# Patient Record
Sex: Female | Born: 1955
Health system: Southern US, Community
[De-identification: ages and names within clinical notes are randomized; demographics above are authoritative.]

## PROBLEM LIST (undated history)

## (undated) DIAGNOSIS — I639 Cerebral infarction, unspecified: Secondary | ICD-10-CM

## (undated) DIAGNOSIS — E7800 Pure hypercholesterolemia, unspecified: Secondary | ICD-10-CM

## (undated) DIAGNOSIS — E1165 Type 2 diabetes mellitus with hyperglycemia: Secondary | ICD-10-CM

## (undated) DIAGNOSIS — H269 Unspecified cataract: Secondary | ICD-10-CM

## (undated) DIAGNOSIS — G459 Transient cerebral ischemic attack, unspecified: Secondary | ICD-10-CM

## (undated) DIAGNOSIS — I10 Essential (primary) hypertension: Secondary | ICD-10-CM

## (undated) DIAGNOSIS — F32A Depression, unspecified: Secondary | ICD-10-CM

## (undated) DIAGNOSIS — I4891 Unspecified atrial fibrillation: Secondary | ICD-10-CM

## (undated) DIAGNOSIS — M1990 Unspecified osteoarthritis, unspecified site: Secondary | ICD-10-CM

## (undated) HISTORY — DX: Unspecified osteoarthritis, unspecified site: M19.90

## (undated) HISTORY — PX: TONSILECTOMY, ADENOIDECTOMY, BILATERAL MYRINGOTOMY AND TUBES: SHX2538

## (undated) HISTORY — DX: Cerebral infarction, unspecified: I63.9

## (undated) HISTORY — PX: COLON SURGERY: SHX602

## (undated) HISTORY — PX: TONSILLECTOMY: SUR1361

## (undated) HISTORY — DX: Depression, unspecified: F32.A

## (undated) HISTORY — DX: Unspecified cataract: H26.9

## (undated) HISTORY — DX: Essential (primary) hypertension: I10

## (undated) HISTORY — DX: Type 2 diabetes mellitus with hyperglycemia: E11.65

---

## 1974-08-04 HISTORY — PX: DILATION AND CURETTAGE OF UTERUS: SHX78

## 1986-08-04 LAB — CONVERTED CEMR LAB: Pap Smear: NORMAL

## 1998-02-22 ENCOUNTER — Emergency Department (HOSPITAL_COMMUNITY): Admission: EM | Admit: 1998-02-22 | Discharge: 1998-02-22 | Payer: Self-pay | Admitting: Emergency Medicine

## 1999-05-29 ENCOUNTER — Inpatient Hospital Stay (HOSPITAL_COMMUNITY): Admission: EM | Admit: 1999-05-29 | Discharge: 1999-05-31 | Payer: Self-pay | Admitting: Emergency Medicine

## 1999-05-29 ENCOUNTER — Encounter: Payer: Self-pay | Admitting: Emergency Medicine

## 2000-04-28 ENCOUNTER — Emergency Department (HOSPITAL_COMMUNITY): Admission: EM | Admit: 2000-04-28 | Discharge: 2000-04-28 | Payer: Self-pay | Admitting: Emergency Medicine

## 2000-05-07 ENCOUNTER — Encounter: Admission: RE | Admit: 2000-05-07 | Discharge: 2000-05-07 | Payer: Self-pay | Admitting: Internal Medicine

## 2000-05-14 ENCOUNTER — Encounter: Admission: RE | Admit: 2000-05-14 | Discharge: 2000-05-14 | Payer: Self-pay | Admitting: Internal Medicine

## 2000-12-13 ENCOUNTER — Encounter: Payer: Self-pay | Admitting: Emergency Medicine

## 2000-12-13 ENCOUNTER — Emergency Department (HOSPITAL_COMMUNITY): Admission: EM | Admit: 2000-12-13 | Discharge: 2000-12-13 | Payer: Self-pay | Admitting: Emergency Medicine

## 2001-03-20 ENCOUNTER — Emergency Department (HOSPITAL_COMMUNITY): Admission: EM | Admit: 2001-03-20 | Discharge: 2001-03-21 | Payer: Self-pay | Admitting: Emergency Medicine

## 2001-10-29 ENCOUNTER — Emergency Department (HOSPITAL_COMMUNITY): Admission: EM | Admit: 2001-10-29 | Discharge: 2001-10-29 | Payer: Self-pay | Admitting: Emergency Medicine

## 2001-11-02 ENCOUNTER — Encounter: Admission: RE | Admit: 2001-11-02 | Discharge: 2001-11-02 | Payer: Self-pay | Admitting: Internal Medicine

## 2002-03-14 ENCOUNTER — Emergency Department (HOSPITAL_COMMUNITY): Admission: EM | Admit: 2002-03-14 | Discharge: 2002-03-14 | Payer: Self-pay | Admitting: *Deleted

## 2003-07-26 ENCOUNTER — Emergency Department (HOSPITAL_COMMUNITY): Admission: EM | Admit: 2003-07-26 | Discharge: 2003-07-26 | Payer: Self-pay | Admitting: Emergency Medicine

## 2003-10-25 ENCOUNTER — Emergency Department (HOSPITAL_COMMUNITY): Admission: EM | Admit: 2003-10-25 | Discharge: 2003-10-25 | Payer: Self-pay | Admitting: Emergency Medicine

## 2004-04-03 ENCOUNTER — Emergency Department (HOSPITAL_COMMUNITY): Admission: EM | Admit: 2004-04-03 | Discharge: 2004-04-03 | Payer: Self-pay | Admitting: Emergency Medicine

## 2004-07-31 ENCOUNTER — Emergency Department (HOSPITAL_COMMUNITY): Admission: EM | Admit: 2004-07-31 | Discharge: 2004-07-31 | Payer: Self-pay | Admitting: Emergency Medicine

## 2004-08-17 ENCOUNTER — Emergency Department (HOSPITAL_COMMUNITY): Admission: EM | Admit: 2004-08-17 | Discharge: 2004-08-17 | Payer: Self-pay | Admitting: Emergency Medicine

## 2004-10-30 ENCOUNTER — Emergency Department (HOSPITAL_COMMUNITY): Admission: EM | Admit: 2004-10-30 | Discharge: 2004-10-30 | Payer: Self-pay | Admitting: Family Medicine

## 2004-11-09 ENCOUNTER — Emergency Department (HOSPITAL_COMMUNITY): Admission: EM | Admit: 2004-11-09 | Discharge: 2004-11-09 | Payer: Self-pay | Admitting: Emergency Medicine

## 2005-06-17 ENCOUNTER — Ambulatory Visit (HOSPITAL_COMMUNITY): Admission: RE | Admit: 2005-06-17 | Discharge: 2005-06-17 | Payer: Self-pay | Admitting: Gastroenterology

## 2005-08-01 ENCOUNTER — Emergency Department (HOSPITAL_COMMUNITY): Admission: EM | Admit: 2005-08-01 | Discharge: 2005-08-01 | Payer: Self-pay | Admitting: Emergency Medicine

## 2006-03-03 ENCOUNTER — Emergency Department (HOSPITAL_COMMUNITY): Admission: EM | Admit: 2006-03-03 | Discharge: 2006-03-03 | Payer: Self-pay | Admitting: Emergency Medicine

## 2006-05-28 ENCOUNTER — Encounter: Payer: Self-pay | Admitting: Vascular Surgery

## 2006-05-28 ENCOUNTER — Encounter (INDEPENDENT_AMBULATORY_CARE_PROVIDER_SITE_OTHER): Payer: Self-pay | Admitting: Cardiology

## 2006-05-28 ENCOUNTER — Inpatient Hospital Stay (HOSPITAL_COMMUNITY): Admission: EM | Admit: 2006-05-28 | Discharge: 2006-05-30 | Payer: Self-pay | Admitting: Emergency Medicine

## 2006-06-01 ENCOUNTER — Emergency Department (HOSPITAL_COMMUNITY): Admission: EM | Admit: 2006-06-01 | Discharge: 2006-06-01 | Payer: Self-pay | Admitting: Emergency Medicine

## 2006-06-02 ENCOUNTER — Ambulatory Visit: Payer: Self-pay | Admitting: Internal Medicine

## 2006-06-04 ENCOUNTER — Ambulatory Visit: Payer: Self-pay | Admitting: *Deleted

## 2006-08-05 ENCOUNTER — Emergency Department (HOSPITAL_COMMUNITY): Admission: EM | Admit: 2006-08-05 | Discharge: 2006-08-05 | Payer: Self-pay | Admitting: Emergency Medicine

## 2007-04-16 DIAGNOSIS — I10 Essential (primary) hypertension: Secondary | ICD-10-CM | POA: Insufficient documentation

## 2007-04-21 ENCOUNTER — Encounter (INDEPENDENT_AMBULATORY_CARE_PROVIDER_SITE_OTHER): Payer: Self-pay | Admitting: *Deleted

## 2007-06-18 ENCOUNTER — Emergency Department (HOSPITAL_COMMUNITY): Admission: EM | Admit: 2007-06-18 | Discharge: 2007-06-18 | Payer: Self-pay | Admitting: Emergency Medicine

## 2007-07-19 ENCOUNTER — Emergency Department (HOSPITAL_COMMUNITY): Admission: EM | Admit: 2007-07-19 | Discharge: 2007-07-19 | Payer: Self-pay | Admitting: Emergency Medicine

## 2007-07-20 ENCOUNTER — Ambulatory Visit (HOSPITAL_COMMUNITY): Admission: RE | Admit: 2007-07-20 | Discharge: 2007-07-20 | Payer: Self-pay | Admitting: Emergency Medicine

## 2007-07-20 ENCOUNTER — Encounter (INDEPENDENT_AMBULATORY_CARE_PROVIDER_SITE_OTHER): Payer: Self-pay | Admitting: Emergency Medicine

## 2007-07-20 ENCOUNTER — Ambulatory Visit: Payer: Self-pay | Admitting: Surgery

## 2007-09-05 ENCOUNTER — Encounter: Admission: RE | Admit: 2007-09-05 | Discharge: 2007-09-05 | Payer: Self-pay | Admitting: Family Medicine

## 2007-09-27 ENCOUNTER — Emergency Department (HOSPITAL_COMMUNITY): Admission: EM | Admit: 2007-09-27 | Discharge: 2007-09-27 | Payer: Self-pay | Admitting: Emergency Medicine

## 2007-10-03 HISTORY — PX: MENISCUS REPAIR: SHX5179

## 2007-10-05 ENCOUNTER — Ambulatory Visit (HOSPITAL_BASED_OUTPATIENT_CLINIC_OR_DEPARTMENT_OTHER): Admission: RE | Admit: 2007-10-05 | Discharge: 2007-10-05 | Payer: Self-pay | Admitting: Orthopedic Surgery

## 2007-12-01 ENCOUNTER — Emergency Department (HOSPITAL_COMMUNITY): Admission: EM | Admit: 2007-12-01 | Discharge: 2007-12-02 | Payer: Self-pay | Admitting: Emergency Medicine

## 2008-07-18 ENCOUNTER — Emergency Department (HOSPITAL_COMMUNITY): Admission: EM | Admit: 2008-07-18 | Discharge: 2008-07-18 | Payer: Self-pay | Admitting: Emergency Medicine

## 2008-07-25 ENCOUNTER — Encounter (INDEPENDENT_AMBULATORY_CARE_PROVIDER_SITE_OTHER): Payer: Self-pay | Admitting: Family Medicine

## 2008-07-31 ENCOUNTER — Ambulatory Visit: Payer: Self-pay | Admitting: Family Medicine

## 2008-07-31 ENCOUNTER — Encounter (INDEPENDENT_AMBULATORY_CARE_PROVIDER_SITE_OTHER): Payer: Self-pay | Admitting: Family Medicine

## 2008-07-31 DIAGNOSIS — N39498 Other specified urinary incontinence: Secondary | ICD-10-CM | POA: Insufficient documentation

## 2008-07-31 LAB — CONVERTED CEMR LAB
ALT: 14 units/L (ref 0–35)
AST: 14 units/L (ref 0–37)
CO2: 26 meq/L (ref 19–32)
Calcium: 9.3 mg/dL (ref 8.4–10.5)
Chloride: 104 meq/L (ref 96–112)
Creatinine, Ser: 1.36 mg/dL — ABNORMAL HIGH (ref 0.40–1.20)
Glucose, Bld: 98 mg/dL (ref 70–99)
HCT: 39.6 % (ref 36.0–46.0)
Hemoglobin: 12.4 g/dL (ref 12.0–15.0)
Platelets: 305 10*3/uL (ref 150–400)
Potassium: 4.4 meq/L (ref 3.5–5.3)
RDW: 15.4 % (ref 11.5–15.5)
Sodium: 140 meq/L (ref 135–145)
TSH: 2.879 microintl units/mL (ref 0.350–4.50)
Total Bilirubin: 0.4 mg/dL (ref 0.3–1.2)
Total CHOL/HDL Ratio: 3.6
Total Protein: 7 g/dL (ref 6.0–8.3)
Triglycerides: 140 mg/dL (ref <150)
VLDL: 28 mg/dL (ref 0–40)
WBC: 5.9 10*3/uL (ref 4.0–10.5)

## 2008-08-01 ENCOUNTER — Encounter (INDEPENDENT_AMBULATORY_CARE_PROVIDER_SITE_OTHER): Payer: Self-pay | Admitting: *Deleted

## 2008-08-01 ENCOUNTER — Encounter (INDEPENDENT_AMBULATORY_CARE_PROVIDER_SITE_OTHER): Payer: Self-pay | Admitting: Family Medicine

## 2008-08-01 DIAGNOSIS — N182 Chronic kidney disease, stage 2 (mild): Secondary | ICD-10-CM | POA: Insufficient documentation

## 2008-08-15 ENCOUNTER — Encounter: Payer: Self-pay | Admitting: Family Medicine

## 2008-08-15 LAB — CONVERTED CEMR LAB: Microalbumin U total vol: NORMAL mg/L

## 2008-08-16 ENCOUNTER — Encounter (INDEPENDENT_AMBULATORY_CARE_PROVIDER_SITE_OTHER): Payer: Self-pay | Admitting: Family Medicine

## 2008-08-16 ENCOUNTER — Encounter: Payer: Self-pay | Admitting: Family Medicine

## 2008-08-16 ENCOUNTER — Ambulatory Visit: Payer: Self-pay | Admitting: Family Medicine

## 2008-08-16 ENCOUNTER — Inpatient Hospital Stay (HOSPITAL_COMMUNITY): Admission: EM | Admit: 2008-08-16 | Discharge: 2008-08-18 | Payer: Self-pay | Admitting: Emergency Medicine

## 2008-08-16 ENCOUNTER — Ambulatory Visit: Payer: Self-pay | Admitting: Cardiology

## 2008-08-18 ENCOUNTER — Encounter: Payer: Self-pay | Admitting: Sports Medicine

## 2008-08-22 ENCOUNTER — Ambulatory Visit: Payer: Self-pay | Admitting: Family Medicine

## 2008-08-22 DIAGNOSIS — E785 Hyperlipidemia, unspecified: Secondary | ICD-10-CM

## 2008-08-22 DIAGNOSIS — E1169 Type 2 diabetes mellitus with other specified complication: Secondary | ICD-10-CM | POA: Insufficient documentation

## 2008-08-23 ENCOUNTER — Encounter: Payer: Self-pay | Admitting: *Deleted

## 2008-08-29 ENCOUNTER — Encounter: Payer: Self-pay | Admitting: *Deleted

## 2008-08-29 ENCOUNTER — Ambulatory Visit: Payer: Self-pay | Admitting: Family Medicine

## 2008-08-30 ENCOUNTER — Encounter: Payer: Self-pay | Admitting: *Deleted

## 2008-08-31 ENCOUNTER — Encounter: Admission: RE | Admit: 2008-08-31 | Discharge: 2008-11-09 | Payer: Self-pay | Admitting: Family Medicine

## 2008-09-15 ENCOUNTER — Encounter (INDEPENDENT_AMBULATORY_CARE_PROVIDER_SITE_OTHER): Payer: Self-pay | Admitting: Family Medicine

## 2008-09-18 ENCOUNTER — Encounter (INDEPENDENT_AMBULATORY_CARE_PROVIDER_SITE_OTHER): Payer: Self-pay | Admitting: Family Medicine

## 2008-09-29 ENCOUNTER — Ambulatory Visit: Payer: Self-pay | Admitting: Family Medicine

## 2008-09-29 DIAGNOSIS — F32A Depression, unspecified: Secondary | ICD-10-CM | POA: Insufficient documentation

## 2008-09-29 DIAGNOSIS — F329 Major depressive disorder, single episode, unspecified: Secondary | ICD-10-CM | POA: Insufficient documentation

## 2008-10-02 ENCOUNTER — Telehealth: Payer: Self-pay | Admitting: *Deleted

## 2008-10-02 ENCOUNTER — Encounter: Payer: Self-pay | Admitting: *Deleted

## 2008-10-03 ENCOUNTER — Encounter: Payer: Self-pay | Admitting: *Deleted

## 2008-10-05 ENCOUNTER — Ambulatory Visit: Payer: Self-pay | Admitting: Family Medicine

## 2008-10-10 ENCOUNTER — Encounter (INDEPENDENT_AMBULATORY_CARE_PROVIDER_SITE_OTHER): Payer: Self-pay | Admitting: Family Medicine

## 2008-10-20 ENCOUNTER — Encounter (HOSPITAL_COMMUNITY): Admission: RE | Admit: 2008-10-20 | Discharge: 2009-01-18 | Payer: Self-pay | Admitting: Cardiology

## 2008-10-24 ENCOUNTER — Telehealth (INDEPENDENT_AMBULATORY_CARE_PROVIDER_SITE_OTHER): Payer: Self-pay | Admitting: *Deleted

## 2008-11-09 ENCOUNTER — Encounter (INDEPENDENT_AMBULATORY_CARE_PROVIDER_SITE_OTHER): Payer: Self-pay | Admitting: Family Medicine

## 2008-11-23 ENCOUNTER — Ambulatory Visit: Payer: Self-pay | Admitting: Family Medicine

## 2008-11-23 ENCOUNTER — Encounter (INDEPENDENT_AMBULATORY_CARE_PROVIDER_SITE_OTHER): Payer: Self-pay | Admitting: Family Medicine

## 2008-11-23 LAB — CONVERTED CEMR LAB
ALT: 16 units/L (ref 0–35)
Albumin: 4 g/dL (ref 3.5–5.2)
Alkaline Phosphatase: 78 units/L (ref 39–117)
CO2: 24 meq/L (ref 19–32)
Cholesterol: 138 mg/dL (ref 0–200)
LDL Cholesterol: 73 mg/dL (ref 0–99)
Potassium: 4 meq/L (ref 3.5–5.3)
Sodium: 139 meq/L (ref 135–145)
Total Bilirubin: 0.3 mg/dL (ref 0.3–1.2)
Total Protein: 7.5 g/dL (ref 6.0–8.3)
VLDL: 20 mg/dL (ref 0–40)

## 2008-11-24 ENCOUNTER — Encounter (INDEPENDENT_AMBULATORY_CARE_PROVIDER_SITE_OTHER): Payer: Self-pay | Admitting: Family Medicine

## 2008-12-11 ENCOUNTER — Encounter: Admission: RE | Admit: 2008-12-11 | Discharge: 2009-01-10 | Payer: Self-pay | Admitting: Family Medicine

## 2008-12-19 ENCOUNTER — Encounter (INDEPENDENT_AMBULATORY_CARE_PROVIDER_SITE_OTHER): Payer: Self-pay | Admitting: Family Medicine

## 2008-12-25 ENCOUNTER — Ambulatory Visit: Payer: Self-pay | Admitting: Family Medicine

## 2008-12-25 ENCOUNTER — Ambulatory Visit (HOSPITAL_COMMUNITY): Admission: RE | Admit: 2008-12-25 | Discharge: 2008-12-25 | Payer: Self-pay | Admitting: Family Medicine

## 2008-12-25 ENCOUNTER — Telehealth: Payer: Self-pay | Admitting: *Deleted

## 2008-12-26 ENCOUNTER — Encounter (INDEPENDENT_AMBULATORY_CARE_PROVIDER_SITE_OTHER): Payer: Self-pay | Admitting: *Deleted

## 2009-01-15 ENCOUNTER — Ambulatory Visit: Payer: Self-pay | Admitting: Family Medicine

## 2009-01-29 ENCOUNTER — Encounter (INDEPENDENT_AMBULATORY_CARE_PROVIDER_SITE_OTHER): Payer: Self-pay | Admitting: Family Medicine

## 2009-02-23 ENCOUNTER — Telehealth: Payer: Self-pay | Admitting: Sports Medicine

## 2009-03-01 ENCOUNTER — Encounter: Payer: Self-pay | Admitting: Family Medicine

## 2009-03-01 ENCOUNTER — Observation Stay (HOSPITAL_COMMUNITY): Admission: EM | Admit: 2009-03-01 | Discharge: 2009-03-02 | Payer: Self-pay | Admitting: Emergency Medicine

## 2009-03-01 ENCOUNTER — Ambulatory Visit: Payer: Self-pay | Admitting: Family Medicine

## 2009-03-13 ENCOUNTER — Encounter: Payer: Self-pay | Admitting: Sports Medicine

## 2009-03-13 ENCOUNTER — Ambulatory Visit: Payer: Self-pay | Admitting: Family Medicine

## 2009-03-13 DIAGNOSIS — B353 Tinea pedis: Secondary | ICD-10-CM | POA: Insufficient documentation

## 2009-03-14 LAB — CONVERTED CEMR LAB
ALT: 37 U/L — ABNORMAL HIGH (ref 0–35)
AST: 27 units/L (ref 0–37)
Albumin: 4.3 g/dL (ref 3.5–5.2)
Alkaline Phosphatase: 83 units/L (ref 39–117)
BUN: 16 mg/dL (ref 6–23)
CO2: 23 meq/L (ref 19–32)
Calcium: 9.3 mg/dL (ref 8.4–10.5)
Chloride: 102 meq/L (ref 96–112)
Creatinine, Ser: 1.04 mg/dL (ref 0.40–1.20)
Glucose, Bld: 108 mg/dL — ABNORMAL HIGH (ref 70–99)
Potassium: 4.1 meq/L (ref 3.5–5.3)
Sodium: 139 meq/L (ref 135–145)
Total Bilirubin: 0.3 mg/dL (ref 0.3–1.2)
Total Protein: 7.3 g/dL (ref 6.0–8.3)

## 2009-03-26 ENCOUNTER — Telehealth: Payer: Self-pay | Admitting: Sports Medicine

## 2009-04-05 ENCOUNTER — Telehealth: Payer: Self-pay | Admitting: Sports Medicine

## 2009-05-20 ENCOUNTER — Emergency Department (HOSPITAL_COMMUNITY): Admission: EM | Admit: 2009-05-20 | Discharge: 2009-05-20 | Payer: Self-pay | Admitting: Emergency Medicine

## 2009-06-02 ENCOUNTER — Inpatient Hospital Stay (HOSPITAL_COMMUNITY): Admission: EM | Admit: 2009-06-02 | Discharge: 2009-06-05 | Payer: Self-pay | Admitting: Emergency Medicine

## 2009-06-02 ENCOUNTER — Ambulatory Visit: Payer: Self-pay | Admitting: Family Medicine

## 2009-06-02 ENCOUNTER — Encounter: Payer: Self-pay | Admitting: Family Medicine

## 2009-06-03 LAB — CONVERTED CEMR LAB
HDL: 40 mg/dL
LDL Cholesterol: 48 mg/dL

## 2009-06-04 ENCOUNTER — Encounter: Payer: Self-pay | Admitting: Family Medicine

## 2009-06-07 ENCOUNTER — Ambulatory Visit: Payer: Self-pay | Admitting: Family Medicine

## 2009-06-07 DIAGNOSIS — I4891 Unspecified atrial fibrillation: Secondary | ICD-10-CM | POA: Insufficient documentation

## 2009-06-07 LAB — CONVERTED CEMR LAB: INR: 2.1

## 2009-06-11 ENCOUNTER — Ambulatory Visit: Payer: Self-pay | Admitting: Family Medicine

## 2009-06-11 LAB — CONVERTED CEMR LAB: INR: 3.3

## 2009-06-12 ENCOUNTER — Ambulatory Visit: Payer: Self-pay | Admitting: Family Medicine

## 2009-06-12 DIAGNOSIS — E1165 Type 2 diabetes mellitus with hyperglycemia: Secondary | ICD-10-CM

## 2009-06-12 DIAGNOSIS — IMO0002 Reserved for concepts with insufficient information to code with codable children: Secondary | ICD-10-CM

## 2009-06-12 HISTORY — DX: Type 2 diabetes mellitus with hyperglycemia: E11.65

## 2009-06-12 HISTORY — DX: Reserved for concepts with insufficient information to code with codable children: IMO0002

## 2009-06-14 ENCOUNTER — Encounter: Payer: Self-pay | Admitting: Sports Medicine

## 2009-06-14 ENCOUNTER — Ambulatory Visit: Payer: Self-pay | Admitting: Family Medicine

## 2009-06-14 ENCOUNTER — Ambulatory Visit (HOSPITAL_COMMUNITY): Admission: RE | Admit: 2009-06-14 | Discharge: 2009-06-14 | Payer: Self-pay | Admitting: Family Medicine

## 2009-06-14 LAB — CONVERTED CEMR LAB
Basophils Absolute: 0 10*3/uL (ref 0.0–0.1)
Basophils Relative: 0 % (ref 0–1)
Eosinophils Absolute: 0.1 K/uL (ref 0.0–0.7)
Eosinophils Relative: 1 % (ref 0–5)
HCT: 39.8 % (ref 36.0–46.0)
Hemoglobin: 12.1 g/dL (ref 12.0–15.0)
INR: 3.8
Lymphocytes Relative: 40 % (ref 12–46)
Lymphs Abs: 3.7 10*3/uL (ref 0.7–4.0)
MCHC: 30.4 g/dL (ref 30.0–36.0)
MCV: 73.7 fL — ABNORMAL LOW (ref 78.0–100.0)
Monocytes Absolute: 0.5 K/uL (ref 0.1–1.0)
Monocytes Relative: 6 % (ref 3–12)
Neutro Abs: 5.1 10*3/uL (ref 1.7–7.7)
Neutrophils Relative %: 54 % (ref 43–77)
Platelets: 325 K/uL (ref 150–400)
RBC: 5.4 M/uL — ABNORMAL HIGH (ref 3.87–5.11)
RDW: 16.1 % — ABNORMAL HIGH (ref 11.5–15.5)
WBC: 9.4 10*3/uL (ref 4.0–10.5)

## 2009-06-18 ENCOUNTER — Encounter: Payer: Self-pay | Admitting: Sports Medicine

## 2009-06-21 ENCOUNTER — Ambulatory Visit: Payer: Self-pay | Admitting: Family Medicine

## 2009-06-21 LAB — CONVERTED CEMR LAB: INR: 2.7

## 2009-07-02 ENCOUNTER — Ambulatory Visit: Payer: Self-pay | Admitting: Family Medicine

## 2009-07-02 LAB — CONVERTED CEMR LAB: INR: 2.1

## 2009-07-03 ENCOUNTER — Telehealth: Payer: Self-pay | Admitting: Sports Medicine

## 2009-07-09 ENCOUNTER — Encounter: Admission: RE | Admit: 2009-07-09 | Discharge: 2009-08-01 | Payer: Self-pay | Admitting: Family Medicine

## 2009-07-09 ENCOUNTER — Ambulatory Visit: Payer: Self-pay | Admitting: Family Medicine

## 2009-07-09 ENCOUNTER — Encounter: Payer: Self-pay | Admitting: Sports Medicine

## 2009-07-09 LAB — CONVERTED CEMR LAB: INR: 1.9

## 2009-07-11 ENCOUNTER — Ambulatory Visit: Payer: Self-pay | Admitting: Family Medicine

## 2009-07-23 ENCOUNTER — Ambulatory Visit: Payer: Self-pay | Admitting: Family Medicine

## 2009-07-23 LAB — CONVERTED CEMR LAB: INR: 1.8

## 2009-08-09 ENCOUNTER — Ambulatory Visit: Payer: Self-pay | Admitting: Family Medicine

## 2009-08-09 LAB — CONVERTED CEMR LAB: INR: 1.8

## 2009-08-19 ENCOUNTER — Ambulatory Visit: Payer: Self-pay | Admitting: Family Medicine

## 2009-08-19 ENCOUNTER — Observation Stay (HOSPITAL_COMMUNITY): Admission: EM | Admit: 2009-08-19 | Discharge: 2009-08-20 | Payer: Self-pay | Admitting: Emergency Medicine

## 2009-08-23 ENCOUNTER — Ambulatory Visit: Payer: Self-pay | Admitting: Family Medicine

## 2009-08-23 LAB — CONVERTED CEMR LAB: INR: 1.6

## 2009-08-27 ENCOUNTER — Encounter: Admission: RE | Admit: 2009-08-27 | Discharge: 2009-11-25 | Payer: Self-pay | Admitting: Family Medicine

## 2009-09-06 ENCOUNTER — Ambulatory Visit: Payer: Self-pay | Admitting: Family Medicine

## 2009-09-06 LAB — CONVERTED CEMR LAB: INR: 2.3

## 2009-09-20 ENCOUNTER — Ambulatory Visit: Payer: Self-pay | Admitting: Family Medicine

## 2009-09-20 LAB — CONVERTED CEMR LAB: INR: 2.2

## 2009-10-10 ENCOUNTER — Encounter: Payer: Self-pay | Admitting: Sports Medicine

## 2009-10-11 ENCOUNTER — Ambulatory Visit: Payer: Self-pay | Admitting: Family Medicine

## 2009-10-11 LAB — CONVERTED CEMR LAB: INR: 1.9

## 2009-10-18 ENCOUNTER — Encounter: Payer: Self-pay | Admitting: Sports Medicine

## 2009-10-25 ENCOUNTER — Ambulatory Visit: Payer: Self-pay | Admitting: Family Medicine

## 2009-10-25 LAB — CONVERTED CEMR LAB: INR: 2.5

## 2009-11-05 ENCOUNTER — Ambulatory Visit: Payer: Self-pay | Admitting: Family Medicine

## 2009-11-05 LAB — CONVERTED CEMR LAB
Hgb A1c MFr Bld: 6.3 %
INR: 3.2

## 2009-11-13 ENCOUNTER — Encounter: Payer: Self-pay | Admitting: Sports Medicine

## 2009-12-12 ENCOUNTER — Encounter: Payer: Self-pay | Admitting: Sports Medicine

## 2010-01-21 ENCOUNTER — Ambulatory Visit: Payer: Self-pay | Admitting: Family Medicine

## 2010-01-21 ENCOUNTER — Other Ambulatory Visit: Admission: RE | Admit: 2010-01-21 | Discharge: 2010-01-21 | Payer: Self-pay | Admitting: Sports Medicine

## 2010-01-21 LAB — CONVERTED CEMR LAB
Bilirubin Urine: NEGATIVE
Glucose, Urine, Semiquant: NEGATIVE
INR: 2.6
Ketones, urine, test strip: NEGATIVE
Microalbumin U total vol: NORMAL mg/L
Nitrite: NEGATIVE
Protein, U semiquant: NEGATIVE
Specific Gravity, Urine: >=1.03
Urobilinogen, UA: 1
pH: 5

## 2010-01-22 LAB — CONVERTED CEMR LAB: Pap Smear: NEGATIVE

## 2010-02-18 ENCOUNTER — Encounter: Payer: Self-pay | Admitting: Sports Medicine

## 2010-02-18 ENCOUNTER — Ambulatory Visit: Payer: Self-pay | Admitting: Family Medicine

## 2010-02-18 LAB — CONVERTED CEMR LAB
INR: 6.6
INR: 5.45 (ref <1.50)
Prothrombin Time: 49.3 s — ABNORMAL HIGH (ref 11.6–15.2)

## 2010-02-19 ENCOUNTER — Ambulatory Visit: Payer: Self-pay | Admitting: Family Medicine

## 2010-02-19 DIAGNOSIS — G5600 Carpal tunnel syndrome, unspecified upper limb: Secondary | ICD-10-CM | POA: Insufficient documentation

## 2010-02-27 ENCOUNTER — Ambulatory Visit: Payer: Self-pay | Admitting: Family Medicine

## 2010-02-27 LAB — CONVERTED CEMR LAB: INR: 1.1

## 2010-03-06 ENCOUNTER — Ambulatory Visit: Payer: Self-pay | Admitting: Family Medicine

## 2010-03-06 LAB — CONVERTED CEMR LAB: INR: 1.8

## 2010-03-12 ENCOUNTER — Ambulatory Visit: Payer: Self-pay | Admitting: Family Medicine

## 2010-03-12 LAB — CONVERTED CEMR LAB: INR: 2.8

## 2010-03-26 ENCOUNTER — Ambulatory Visit: Payer: Self-pay | Admitting: Family Medicine

## 2010-03-26 LAB — CONVERTED CEMR LAB: INR: 3

## 2010-04-19 ENCOUNTER — Encounter: Payer: Self-pay | Admitting: Sports Medicine

## 2010-04-19 ENCOUNTER — Ambulatory Visit: Payer: Self-pay | Admitting: Family Medicine

## 2010-04-19 LAB — CONVERTED CEMR LAB: INR: 2.6

## 2010-04-28 ENCOUNTER — Emergency Department (HOSPITAL_COMMUNITY): Admission: EM | Admit: 2010-04-28 | Discharge: 2010-04-28 | Payer: Self-pay | Admitting: Emergency Medicine

## 2010-05-17 ENCOUNTER — Ambulatory Visit: Payer: Self-pay | Admitting: Family Medicine

## 2010-05-17 LAB — CONVERTED CEMR LAB: INR: 3.2

## 2010-05-31 ENCOUNTER — Ambulatory Visit: Payer: Self-pay | Admitting: Family Medicine

## 2010-05-31 ENCOUNTER — Ambulatory Visit (HOSPITAL_COMMUNITY): Admission: RE | Admit: 2010-05-31 | Discharge: 2010-05-31 | Payer: Self-pay | Admitting: Sports Medicine

## 2010-05-31 LAB — CONVERTED CEMR LAB: INR: 2.9

## 2010-06-10 ENCOUNTER — Encounter: Payer: Self-pay | Admitting: Sports Medicine

## 2010-07-14 ENCOUNTER — Emergency Department (HOSPITAL_COMMUNITY)
Admission: EM | Admit: 2010-07-14 | Discharge: 2010-07-15 | Payer: Self-pay | Source: Home / Self Care | Admitting: Internal Medicine

## 2010-07-16 ENCOUNTER — Ambulatory Visit: Payer: Self-pay | Admitting: Family Medicine

## 2010-07-16 LAB — CONVERTED CEMR LAB: INR: 3.2

## 2010-07-30 ENCOUNTER — Ambulatory Visit: Payer: Self-pay | Admitting: Family Medicine

## 2010-07-30 ENCOUNTER — Encounter: Payer: Self-pay | Admitting: Sports Medicine

## 2010-07-30 LAB — CONVERTED CEMR LAB
Bilirubin Urine: NEGATIVE
Epithelial cells, urine: >20 /lpf
Glucose, Urine, Semiquant: >=1000
Hgb A1c MFr Bld: 8.5 %
INR: 2.5
Nitrite: NEGATIVE
Protein, U semiquant: 30
Specific Gravity, Urine: >=1.03
Urobilinogen, UA: 2
WBC, UA: >20 cells/hpf
pH: 6

## 2010-07-31 ENCOUNTER — Encounter: Payer: Self-pay | Admitting: Sports Medicine

## 2010-07-31 LAB — CONVERTED CEMR LAB: Direct LDL: 107 mg/dL — ABNORMAL HIGH

## 2010-08-13 ENCOUNTER — Ambulatory Visit: Admit: 2010-08-13 | Payer: Self-pay

## 2010-08-14 ENCOUNTER — Encounter (INDEPENDENT_AMBULATORY_CARE_PROVIDER_SITE_OTHER): Payer: Self-pay | Admitting: *Deleted

## 2010-08-25 ENCOUNTER — Encounter: Payer: Self-pay | Admitting: Sports Medicine

## 2010-09-04 ENCOUNTER — Ambulatory Visit (INDEPENDENT_AMBULATORY_CARE_PROVIDER_SITE_OTHER): Payer: Self-pay | Admitting: Sports Medicine

## 2010-09-04 ENCOUNTER — Encounter: Payer: Self-pay | Admitting: Sports Medicine

## 2010-09-04 DIAGNOSIS — F329 Major depressive disorder, single episode, unspecified: Secondary | ICD-10-CM

## 2010-09-04 DIAGNOSIS — I10 Essential (primary) hypertension: Secondary | ICD-10-CM

## 2010-09-04 DIAGNOSIS — F3289 Other specified depressive episodes: Secondary | ICD-10-CM

## 2010-09-04 DIAGNOSIS — I4891 Unspecified atrial fibrillation: Secondary | ICD-10-CM

## 2010-09-04 DIAGNOSIS — G5600 Carpal tunnel syndrome, unspecified upper limb: Secondary | ICD-10-CM

## 2010-09-04 DIAGNOSIS — E119 Type 2 diabetes mellitus without complications: Secondary | ICD-10-CM

## 2010-09-04 DIAGNOSIS — Z7901 Long term (current) use of anticoagulants: Secondary | ICD-10-CM

## 2010-09-04 DIAGNOSIS — E785 Hyperlipidemia, unspecified: Secondary | ICD-10-CM

## 2010-09-04 LAB — CONVERTED CEMR LAB: INR: 2.4

## 2010-09-04 NOTE — Assessment & Plan Note (Addendum)
We will set up a dedicated office visit to discuss her DM2. Her A1c is again uncontrolled. I think that in the past we have tackled too many different problems in each visit for her to absorb. Ophtho referral to [facility].

## 2010-09-04 NOTE — Assessment & Plan Note (Signed)
Resolved s/p injection. May repeat this injection q12 weeks if needed if using 1mg  kenalog or q6 weeks if using 1/2mg  kenalog in each injection. Need to assess how long the injections help (its been about 5 weeks so far).

## 2010-09-04 NOTE — Assessment & Plan Note (Signed)
Deteriorated. Restarting Celexa as this worked very well in the past, if only she would be compliant with this medication for an extended period of time. She will RTC to reassess in a month.

## 2010-09-04 NOTE — Assessment & Plan Note (Signed)
Cont zocor. Last dLDL 107, nearly controlled. She is not yet due for a repeat dLDL.

## 2010-09-04 NOTE — Assessment & Plan Note (Signed)
Improved but not ideal. Will increase clonidine to 0.2mg  PO BID. Increase HCTZ 25 and lisinopril 10 to Lisinopril/HCTZ 20/25mg  Combo RTC 1 month to reassess.

## 2010-09-04 NOTE — Progress Notes (Signed)
  Subjective:    Patient ID: [name], female    DOB: 1955-10-07, 55 y.o.   MRN: [id number]  HPI Pt here for fu of MMP.  DM2: Uncontrolled, she takes several oral hypoglycemics but is not entirely compliant.    HTN:  Had been off all BP medications since last visit.  Has been taking them now.  HLD: Taking Zocor.  Depression: Has been out of celexa for a month now.  Has crying spells, feels tired and down all the time.  Family members present and note that she was [facility] better when taking the celexa.  Pt amenable to going back on this medication.  CTS:  Injected at last visit, all symptoms resolved.   Review of Systems    Neg except as in HPI. Objective:   Physical Exam  Constitutional: She appears well-developed and well-nourished. No distress.  Cardiovascular: Normal rate, regular rhythm, normal heart sounds and intact distal pulses.   Pulmonary/Chest: Effort normal and breath sounds normal.          Assessment & Plan:

## 2010-09-05 NOTE — Assessment & Plan Note (Signed)
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  L hand:  Numbness/burning on thumb, index, middle, and radial half of ring fingers.  No neck pain.  Had injections on the other side that helped for a long time but she did eventually have the release surgery.  Injections deferred at last visit due to supratherapeutic INR.  Dysuria: Some burning with urination and urinary incontinence when she coughs or sneezes.  Has a dx of stress incontinence but has never had treatments or done Kegel exercises.  DM, HTN:  Pt has not been taking any of her medications.    Needs flu shot.   Current Medications (verified): 1)  Lisinopril 40 Mg Tabs (Lisinopril) .... 2 Tablets By Mouth Daily For Blood Pressure 2)  Hydrochlorothiazide 25 Mg Tabs (Hydrochlorothiazide) .Marland Kitchen.. 1 Tablet By Mouth Daily For Blood Pressure 3)  Norvasc 10 Mg Tabs (Amlodipine Besylate) .Marland Kitchen.. 1 Tablet By Mouth Daily For Blood Pressure 4)  Simvastatin 20 Mg Tabs (Simvastatin) .Marland Kitchen.. 1 Tablet By Mouth Daily For Cholesterol 5)  Celexa 40 Mg Tabs (Citalopram Hydrobromide) .... One Tab By Mouth Daily 6)  Celebrex 200 Mg Caps (Celecoxib) .... One Tab By Mouth Daily. 7)  Metformin Hcl 1000 Mg Tabs (Metformin Hcl) .... One Tab Po Qam, 1/2  Tab By Mouth Qpm 8)  Warfarin Sodium 5 Mg Tabs (Warfarin Sodium) .... (One Tab) 5mg  Tues & Fri; 7.5mg  (One and A Half Tabs) Other Days 9)  Metoprolol Succinate 25 Mg Xr24h-Tab (Metoprolol Succinate) .... Take One Tablet Once Daily 10)  Prodigy Blood Glucose Monitor W/device Kit (Blood Glucose  Monitoring Suppl) .... Test Blood Glucose Up To Twice Daily. Dispense One Month Supply of Strips. 11)  Prodigy Lancets 21g  Misc (Lancets) .... Dispense One Box.   Quantity For One Month Supply of Twice Daily Checking. 12)  Prodigy Blood Glucose Test  Strp (Glucose Blood) .... Dispense Quantity of Strips Sufficient For Twice Daily Checking.  1 Box 13)  Nystatin 100000 Unit/gm Powd (Nystatin) .... Apply Powder Under Breasts Three Times A Day. 14)  Glipizide 2.5 Mg Xr24h-Tab (Glipizide) .... One Tab By Mouth With Breakfast 15)  Prilosec 20 Mg Cpdr (Omeprazole) .... One Tab By Mouth Daily 16)  Shower Bench .... Use in Shower To Prevent Falls 17)  Keflex 500 Mg Caps (Cephalexin) .... One Tab By Mouth Two Times A Day X 7d  Allergies (verified): 1)  ! Darvocet-N 100  Review of Systems       See HPI  Physical Exam  General:  Well-developed,well-nourished,in no acute distress; alert,appropriate and cooperative throughout examination Msk:  Wrist:L Inspection normal with no visible erythema or swelling. ROM smooth and normal with good flexion and extension and ulnar/radial deviation that is symmetrical with opposite wrist. Palpation is normal over metacarpals, navicular, lunate, and TFCC; tendons without tenderness/ swelling Strength 5/5 in all directions without pain. Negative Finkelstein, tinel's and phalens.  Hypoesthesia in median distribution.  Maybe some mild thenar atrophy.  Weak  thumb abduction compared to right side. Additional Exam:  L wrist carpal tunnel injection: Consent obtained. Cleansed with alcohol. Topical analgesic spray: Ethyl chloride. Joint: Carpal tunnel, Left. Approached in typical fashion with: 25g needle, advanced between flexor carpi ulnaris and just medial to palmaris longus tendons into carpal tunnel. Completed without difficulty, plunger easily pushed, pt with sensation of pressure but no paresthesias. Meds: 1cc kenalog 40 and 1cc lidocaine 1% into carpal  tunnel. Needle: 25g Aftercare instructions and Red flags advised. Pressure dressing applied.   Impression & Recommendations:  Problem # 1:  CARPAL TUNNEL SYNDROME, LEFT (ICD-354.0) Assessment Deteriorated Injected. RTC as needed.  Orders: [facility]- Est Level  5 (16109) Therapeutic Injection Carpal Tunnel (60454)  Problem # 2:  DIABETES MELLITUS, TYPE II (ICD-250.00) Assessment: Deteriorated Pt has not been taking any of her meds. A1c elevated but has been controlled in the past when on meds. Will have her RTC 2 weeks after she has restarted taking all her meds.  Her updated medication list for this problem includes:    Lisinopril 40 Mg Tabs (Lisinopril) .Marland Kitchen... 2 tablets by mouth daily for blood pressure    Metformin Hcl 1000 Mg Tabs (Metformin hcl) ..... One tab po qam, 1/2  tab by mouth qpm    Glipizide 2.5 Mg Xr24h-tab (Glipizide) ..... One tab by mouth with breakfast  Orders: A1C-[facility] (09811) [facility]- Est Level  5 (91478)  Problem # 3:  COUMADIN THERAPY (ICD-V58.61) Assessment: Improved INR therapeutic today.  Orders: [facility]- Est Level  5 (99215) INR/PT-[facility] (29562)  Problem # 4:  HYPERLIPIDEMIA (ICD-272.4) Assessment: Unchanged Checking dLDL today.  Her updated medication list for this problem includes:    Simvastatin 20 Mg Tabs (Simvastatin) .Marland Kitchen... 1 tablet by mouth daily for cholesterol  Orders: [facility]- Est Level  5 (13086) Direct LDL-[facility] (57846-96295)  Problem # 5:  STRESS INCONTINENCE (ICD-788.39) Assessment: Deteriorated Checked UA to assess for infection. UA sugestive of cystitis, will treat with keflex. UCx. Advised Kegel exercises regularly. Pt aware that like any other muscle exercise, this can take months to strengthen pelvic floor muscles. Handout/education given re: stress incontinence. RTC as needed.  Orders: [facility]- Est Level  5 (28413) Urinalysis-[facility] (00000) Urine Culture-[facility] (24401-02725)  Problem # 6:  HYPERTENSION (ICD-401.9) Assessment:  Deteriorated Pt is off all BP meds as she cannot afford them but endorses she can afford them now. She will RTC 2 weeks after she has been taking all her meds as rx'ed to go over results and augment meds as necessary.  Her updated medication list for this problem includes:    Lisinopril 40 Mg Tabs (Lisinopril) .Marland Kitchen... 2 tablets by mouth daily for blood pressure    Hydrochlorothiazide 25 Mg Tabs (Hydrochlorothiazide) .Marland Kitchen... 1 tablet by mouth daily for blood pressure    Norvasc 10 Mg Tabs (Amlodipine besylate) .Marland Kitchen... 1 tablet by mouth daily for blood pressure    Metoprolol Succinate 25 Mg Xr24h-tab (Metoprolol succinate) .Marland Kitchen... Take one tablet once daily  Orders: [facility]- Est Level  5 (36644)  Problem # 7:  Preventive Health Care (ICD-V70.0) Assessment: Comment Only Flu shot given today.  Complete Medication List: 1)  Lisinopril 40 Mg Tabs (Lisinopril) .... 2 tablets by mouth daily for blood pressure 2)  Hydrochlorothiazide 25 Mg Tabs (Hydrochlorothiazide) .Marland Kitchen.. 1 tablet by mouth daily for blood pressure 3)  Norvasc 10 Mg Tabs (Amlodipine besylate) .Marland Kitchen.. 1 tablet by mouth daily for blood pressure 4)  Simvastatin 20 Mg Tabs (Simvastatin) .Marland Kitchen.. 1 tablet by mouth daily for cholesterol 5)  Celexa 40 Mg Tabs (Citalopram hydrobromide) .... One tab by mouth daily 6)  Celebrex 200 Mg Caps (Celecoxib) .... One tab by mouth daily. 7)  Metformin Hcl 1000 Mg Tabs (Metformin hcl) .... One tab po qam, 1/2  tab by mouth qpm 8)  Warfarin Sodium 5 Mg Tabs (Warfarin sodium) .... (one tab) 5mg  tues & fri; 7.5mg  (one and a half tabs) other days 9)  Metoprolol Succinate 25 Mg Xr24h-tab (Metoprolol succinate) .... Take one tablet once daily 10)  Prodigy Blood Glucose Monitor W/device Kit (Blood glucose monitoring suppl) .... Test blood glucose up to twice daily. dispense one month supply of strips. 11)  Prodigy Lancets 21g Misc (Lancets) .... Dispense one box.   quantity for one month supply of twice daily checking. 12)   Prodigy Blood Glucose Test Strp (Glucose blood) .... Dispense quantity of strips sufficient for twice daily checking.  1 box 13)  Nystatin 100000 Unit/gm Powd (Nystatin) .... Apply powder under breasts three times a day. 14)  Glipizide 2.5 Mg Xr24h-tab (Glipizide) .... One tab by mouth with breakfast 15)  Prilosec 20 Mg Cpdr (Omeprazole) .... One tab by mouth daily 16)  Tour manager  .... Use in shower to prevent falls 17)  Keflex 500 Mg Caps (Cephalexin) .... One tab by mouth two times a day x 7d  Other Orders: Influenza Vaccine NON MCR (91478)  Patient Instructions: 1)  Carpal tunnel injection. 2)  Flu shot. 3)  Handout on Stress incontinence. 4)  Checking bloodwork. 5)  Fill your medications as soon as possible. 6)  Come back to see me after you have been taking all of your meds for 2 weeks so we can make sure nothing needs to be adjusted on your correct meds. 7)  Metformin 1000mg  in the morning and 500mg  in the evening. 8)  -Dr. Karie Schwalbe. Prescriptions: KEFLEX 500 MG CAPS (CEPHALEXIN) One tab by mouth two times a day x 7d  #14 x 0   Entered and Authorized by:   Rodney Langton MD   Signed by:   Rodney Langton MD on 07/30/2010   Method used:   Electronically to        [facility] [phone]* (retail)       [address]       [place], [place]  [address]       Ph: [phone]       Fax: [phone]   RxID:   [id number]    Orders Added: 1)  A1C-[facility] [83036] 2)  [facility]- Est Level  5 [25956] 3)  Therapeutic Injection Carpal Tunnel [20526] 4)  Urinalysis-[facility] [00000] 5)  Urine Culture-[facility] [38756-43329] 6)  INR/PT-[facility] [85610] 7)  Direct LDL-[facility] [83721-81033] 8)  Influenza Vaccine NON MCR [00028]   Immunizations Administered:  Influenza Vaccine # 1:    Vaccine Type: Fluvax Non-MCR    Site: left deltoid    Mfr: GlaxoSmithKline    Dose: 0.5 ml    Route: IM    Given by: Loralee Pacas CMA    Exp. Date: 02/01/2011    Lot #: [id number]    VIS given: 02/26/10 version  given July 30, 2010.  Flu Vaccine Consent Questions:    Do you have a history of severe allergic reactions to this vaccine? no    Any prior history of allergic reactions to egg and/or gelatin? no    Do you have a sensitivity to the preservative Thimersol? no    Do you have a past history of Guillan-Barre Syndrome? no  Do you currently have an acute febrile illness? no    Have you ever had a severe reaction to latex? no    Vaccine information given and explained to patient? yes    Are you currently pregnant? no   Immunizations Administered:  Influenza Vaccine # 1:    Vaccine Type: Fluvax Non-MCR    Site: left deltoid    Mfr: GlaxoSmithKline    Dose: 0.5 ml    Route: IM    Given by: Loralee Pacas CMA    Exp. Date: 02/01/2011    Lot #: [id number]    VIS given: 02/26/10 version given July 30, 2010.    Laboratory Results   Urine Tests  Date/Time Received: July 30, 2010 9:05 AM  Date/Time Reported: July 30, 2010 9:33 AM July 30, 2010 9:51 AM   Routine Urinalysis   Color: yellow Appearance: slightly Cloudy Glucose: >=1000   (Normal Range: Negative) Bilirubin: negative   (Normal Range: Negative) Ketone: trace (5)   (Normal Range: Negative) Spec. Gravity: >=1.030   (Normal Range: 1.003-1.035) Blood: small   (Normal Range: Negative) pH: 6.0   (Normal Range: 5.0-8.0) Protein: 30   (Normal Range: Negative) Urobilinogen: 2.0   (Normal Range: 0-1) Nitrite: negative   (Normal Range: Negative) Leukocyte Esterace: trace   (Normal Range: Negative)  Urine Microscopic WBC/HPF: >20 RBC/HPF: 1-5 Bacteria/HPF: 3+ rods and cocci Epithelial/HPF: >20 Other: 2+ clue cells    Comments: urine sent for culture ...............test performed by......Marland KitchenBonnie A. [place], MLS (ASCP)cm   Blood Tests   Date/Time Received: July 30, 2010 9:05 AM  Date/Time Reported: July 30, 2010 9:32 AM   HGBA1C: 8.5%   (Normal Range: Non-Diabetic - 3-6%   Control Diabetic  - 6-8%)  INR: 2.5   (Normal Range: 0.88-1.12   Therap INR: 2.0-3.5) Comments: ...............test performed by......Marland KitchenBonnie A. [place], MLS (ASCP)cm, Milinda Antis MD        ANTICOAGULATION RECORD PREVIOUS REGIMEN & LAB RESULTS Anticoagulation Diagnosis:  Atrial fibrillation on  06/07/2009 Previous INR Goal Range:  2-3 on  06/07/2009 Previous INR:  3.2 on  07/16/2010 Previous Coumadin Dose(mg):  5MG  Tablets on  06/07/2009 Previous Regimen:  2.5 mg - Tues;  5 mg - other days on  07/16/2010 Previous Coagulation Comments:  Pt states she has started taking Naproxen. on  05/17/2010  NEW REGIMEN & LAB RESULTS Current INR: 2.5 Regimen: continue same:  2.5 mg - Tues;  5 mg - other days  Provider: Benjamin Stain Repeat testing in: 2 weeks  08-13-10 Other Comments: ...............test performed by......Marland KitchenBonnie A. [place], MLS (ASCP)cm   Dose has been reviewed with patient or caretaker during this visit. Reviewed by: [name] (ASCP)cm  Anticoagulation Visit Questionnaire Coumadin dose missed/changed:  No Abnormal Bleeding Symptoms:  No  Any diet changes including alcohol intake, vegetables or greens since the last visit:  No Any illnesses or hospitalizations since the last visit:  No Any signs of clotting since the last visit (including chest discomfort, dizziness, shortness of breath, arm tingling, slurred speech, swelling or redness in leg):  No  MEDICATIONS LISINOPRIL 40 MG TABS (LISINOPRIL) 2 tablets by mouth daily for blood pressure HYDROCHLOROTHIAZIDE 25 MG TABS (HYDROCHLOROTHIAZIDE) 1 tablet by mouth daily for blood pressure NORVASC 10 MG TABS (AMLODIPINE BESYLATE) 1 tablet by mouth daily for blood pressure SIMVASTATIN 20 MG TABS (SIMVASTATIN) 1 tablet by mouth daily for cholesterol CELEXA 40 MG TABS (CITALOPRAM HYDROBROMIDE) One tab by mouth daily CELEBREX 200 MG CAPS (CELECOXIB) One tab by mouth daily. METFORMIN HCL  1000 MG TABS (METFORMIN HCL) One tab PO qAM, 1/2  tab by  mouth qPM WARFARIN SODIUM 5 MG TABS (WARFARIN SODIUM) (One tab) 5mg  Tues & Fri; 7.5mg  (one and a half tabs) other days METOPROLOL SUCCINATE 25 MG XR24H-TAB (METOPROLOL SUCCINATE) Take one tablet once daily PRODIGY BLOOD GLUCOSE MONITOR W/DEVICE KIT (BLOOD GLUCOSE MONITORING SUPPL) Test blood glucose up to twice daily. dispense one month supply of strips. PRODIGY LANCETS 21G  MISC (LANCETS) Dispense one box.   Quantity for one month supply of twice daily checking. PRODIGY BLOOD GLUCOSE TEST  STRP (GLUCOSE BLOOD) dispense quantity of strips sufficient for twice daily checking.  1 box NYSTATIN 100000 UNIT/GM POWD (NYSTATIN) Apply powder under breasts three times a day. GLIPIZIDE 2.5 MG XR24H-TAB (GLIPIZIDE) one tab by mouth with breakfast PRILOSEC 20 MG CPDR (OMEPRAZOLE) One tab by mouth daily * SHOWER BENCH Use in shower to prevent falls KEFLEX 500 MG CAPS (CEPHALEXIN) One tab by mouth two times a day x 7d

## 2010-09-05 NOTE — Progress Notes (Signed)
 Summary: refill   Phone Note Refill Request Call back at [phone] Message from:  Patient  Refills Requested: Medication #1:  NORVASC  10 MG TABS 1 tablet by mouth daily for blood pressure  Medication #2:  PLAVIX  75 MG TABS 1 tablet by mouth daily to prevent stroke Initial call taken by: Karna Seminole,  February 23, 2009 2:15 PM    Prescriptions: PLAVIX  75 MG TABS (CLOPIDOGREL  BISULFATE) 1 tablet by mouth daily to prevent stroke  #90 x 0   Entered and Authorized by:   [name] MD   Signed by:   [name] MD on 02/23/2009   Method used:   Electronically to        [facility] [phone]* (retail)       [address]       [place], [place]  [address]       Ph: [phone]       Fax: [phone]   RxID:   [id number] NORVASC  10 MG TABS (AMLODIPINE  BESYLATE) 1 tablet by mouth daily for blood pressure  #90 x 3   Entered and Authorized by:   [name] MD   Signed by:   [name] MD on 02/23/2009   Method used:   Electronically to        [facility] [phone]* (retail)       [address]       [place], [place]  [address]       Ph: [phone]       Fax: [phone]   RxID:   [id number]

## 2010-09-05 NOTE — Miscellaneous (Signed)
Summary: reschedule visit  Medications Added WARFARIN SODIUM 5 MG TABS (WARFARIN SODIUM) (One tab) 5mg  Tues & Fri; 7.5mg  (one and a half tabs) other days       Clinical Lists Changes Tanji from [facility] called. [facility]. her coumadin dose changes every time her pt/inr changed. she is to be using 7.5 mg. the need a new rx faxed to them with this dose. "take as directed" is not ok with them. ofc [phone]. fax- [phone]. if you add it to med list, I can print it & fax.Golden Circle RN  November 13, 2009 11:28 AM  Medications: Changed medication from WARFARIN SODIUM 5 MG TABS (WARFARIN SODIUM) as directed to WARFARIN SODIUM 5 MG TABS (WARFARIN SODIUM) (One tab) 5mg  Tues & Fri; 7.5mg  (one and a half tabs) other days - Signed Rx of WARFARIN SODIUM 5 MG TABS (WARFARIN SODIUM) (One tab) 5mg  Tues & Fri; 7.5mg  (one and a half tabs) other days;  #90 x 11;  Signed;  Entered by: Rodney Langton MD;  Authorized by: Rodney Langton MD;  Method used: Faxed to [facility], [address] [place], [place]  [address], Ph: [phone], Fax: [phone]    Prescriptions: WARFARIN SODIUM 5 MG TABS (WARFARIN SODIUM) (One tab) 5mg  Tues & Fri; 7.5mg  (one and a half tabs) other days  #90 x 11   Entered and Authorized by:   Rodney Langton MD   Signed by:   Rodney Langton MD on 11/13/2009   Method used:   Faxed to ...       [facility] [employer] [facility] (mail-order)       [address] [place], [place]  [address]       Ph: [phone]       Fax: [phone]   RxID:   [id number]

## 2010-09-05 NOTE — Miscellaneous (Signed)
Summary: [facility]?   Clinical Lists Changes rec'd form for her to get an aide in home. sent it back to agency with note she needs to call us & make & keep appt with pcp. (hx dnkas).Golden Circle RN  Dec 12, 2009 2:10 PM  Noted, thanks. Rodney Langton MD  Dec 13, 2009 3:18 PM

## 2010-09-05 NOTE — Miscellaneous (Signed)
Summary: Procedure Consent  Procedure Consent   Imported By: Bradly Bienenstock 08/02/2010 09:13:17  _____________________________________________________________________  External Attachment:    Type:   Image     Comment:   External Document

## 2010-09-05 NOTE — Miscellaneous (Signed)
   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

## 2010-09-05 NOTE — Assessment & Plan Note (Signed)
 Summary: f/u,df/per conversation to pay $3 at time of checkin/bmc   Vital Signs:  Patient profile:   55 year old female Weight:      274 pounds BMI:     50.30 Temp:     97.7 degrees F Pulse rate:   80 / minute BP sitting:   106 / 70  (left arm)  Vitals Entered By: Carla Sharps RN (March 13, 2009 11:39 AM) CC: hosp follow up   Primary Care Provider:  [name] MD  CC:  hosp follow up.  History of Present Illness: 63F with HTN, LBP, here for hospital FU  HTN: Controlled with current meds.  No CP, SOB, numbness, tingling.  LBP:  Degenerative changes on facets on recent XRs.  Using E-stim rx by PT.  Not helping.  Doesnt want trigger pt injection.  Has never been on muscle relaxant.  Can't stand longer that 15-30 mins.  Hospital FU:  Admitted for CP and dental caries.  Ruled out for MI in hospital.  No CP since.  Has been on Augmentin  for dental caries, Has appt with dentist next week.  Foot fungus: itchy feet for years now.   Habits & Providers  Alcohol-Tobacco-Diet     Tobacco Status: never     Oral Surgeon: Dr. Mavis     Orthopedist: Dr. Liam, [facility].  Allergies: No Known Drug Allergies  Past History:  Past Medical History: Last updated: 12/25/2008 Hospitalized 05/2006 for hypertensive urgency and face numbness thought secondary to small vessel disease.     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    - MRA of neck shows moderate intracranial atheroscleric changes    - carotid dopplers: no R ICA stenosis, L ICA has 40-60% stenosis distally, vertebral artery flow is antegrade    - 2 D echo report is currently pending (08/22/08)  Stress test negative  (10/2008) Dr. Levern.  Past Surgical History: Last updated: 07/31/2008 T&A at age 60 D&C for Hydatiform Mole Pregnancy- 1976 Carpal tunnel release right knee meniscus repair - 3/09 by Dr. Liam  Family History: Last updated: 07/31/2008 has 15 brothers and sister.  Some have HTN.  One sister died of lung cancer. mother- died of congestive heart failure at age 31. father- still alive, has diabetes & HTN  Social History: Last updated: 08/29/2008 High school education.  Has car & used to drive until she had a stroke.  Worked at [employer] at [employer].  Separated from husband for 26 years.  Has 3 children in 33s- 30s and raising 43 yo grandson.   Lives with grandson and youngest daughter.  Has boyfriend who she has ben dating for 8 years.  Boyfriend has lung cancer.  Denies alcohol, tobacco, and drug use.  Review of Systems       See HPI  Physical Exam  General:  Well-developed,well-nourished,in no acute distress; alert,appropriate and cooperative throughout examination Mouth:  Necrotic dental caries on left maxillary.  No facial induration or erythema. Lungs:  Normal respiratory effort, chest expands symmetrically. Lungs are clear to auscultation, no crackles or wheezes. Heart:  Normal rate and regular rhythm. S1 and S2 normal without gallop, murmur, click, rub or other extra sounds. Msk:  Back very TTP left paraspinal muscles lumbar region.   Extremities:  Toenails thickened, foot skin lichenified, pruritic. Additional Exam:  KOH prep performed by Carla Petties, MD negative for hyphae or yeasts.   Impression & Recommendations:  Problem # 1:  DERMATOPHYTOSIS OF FOOT (ICD-110.4) Assessment New Foot complaints likely 2/2 fungus though none isolated on KOH prep.  Will check LFTs and then tx with oral Terbinafine  if nl.  Orders: Comp Met-[facility] (19946-77099) KOH-[facility] (12779)  Problem # 2:  DENTAL CARIES (ICD-521.00) Assessment: Improved Appt with dentist next  week.  FInishing course of augmentin .  Orders: [facility]- Est  Level 4 (00785)  Problem # 3:  UNSPECIFIED BREAST SCREENING (ICD-V76.10) Assessment: New Will get screening mammogram.  Will also have pt come back at my next spot for a PAP.  Orders: [facility]- Est  Level 4 (99214) Mammogram (Screening) (Mammo)  Problem # 4:  LUMBAGO (ICD-724.2) Assessment: Unchanged Will try the below medications to help with pain.  Pt does have trigger points but refuses injections.  Her updated medication list for this problem includes:    Celebrex  200 Mg Caps (Celecoxib ) ..... One tab by mouth daily.    Cyclobenzaprine  Hcl 5 Mg Tabs (Cyclobenzaprine  hcl) ..... One tab by mouth tid  Orders: [facility]- Est  Level 4 (00785)  Problem # 5:  HYPERTENSION (ICD-401.9) Assessment: Improved Controlled on current meds.  Her updated medication list for this problem includes:    Lisinopril  40 Mg Tabs (Lisinopril ) .[name]... 2 tablets by mouth daily for blood pressure    Clonidine  Hcl 0.2 Mg Tabs (Clonidine  hcl) .[name]... 1 tablet by mouth two times a day for blood pressure    Hydrochlorothiazide  25 Mg Tabs (Hydrochlorothiazide ) .[name]... 1 tablet by mouth daily for blood pressure    Norvasc  10 Mg Tabs (Amlodipine  besylate) .[name]... 1 tablet by mouth daily for blood pressure    Coreg  3.125 Mg Tabs (Carvedilol ) .[name]... 1 tablet by mouth two times a day for blood pressure  Orders: [facility]- Est  Level 4 (99214) Comp Met-[facility] ([id number])  Complete Medication List: 1)  Lisinopril  40 Mg Tabs (Lisinopril ) .... 2 tablets by mouth daily for blood pressure 2)  Clonidine  Hcl 0.2 Mg Tabs (Clonidine  hcl) .[name].. 1 tablet by mouth two times a day for blood pressure 3)  Hydrochlorothiazide  25 Mg Tabs (Hydrochlorothiazide ) .[name].. 1 tablet by mouth daily for blood pressure 4)  Norvasc  10 Mg Tabs (Amlodipine  besylate) .[name].. 1 tablet by mouth daily for blood pressure 5)  Plavix  75 Mg Tabs (Clopidogrel  bisulfate) .[name].. 1 tablet by mouth daily to prevent stroke 6)   Simvastatin  20 Mg Tabs (Simvastatin ) .[name].. 1 tablet by mouth daily for cholesterol 7)  Celexa  10 Mg Tabs (Citalopram  hydrobromide) .[name].. 1 tablet by mouth daily 8)  Coreg  3.125 Mg Tabs (Carvedilol ) .[name].. 1 tablet by mouth two times a day for blood pressure 9)  Celebrex  200 Mg Caps (Celecoxib ) .... One tab by mouth daily. 10)  Cyclobenzaprine  Hcl 5 Mg Tabs (Cyclobenzaprine  hcl) .... One tab by mouth tid  Patient Instructions: 1)  Great to see you today, 2)  Your blood pressure is excellent. 3)  I will try a muscle relaxant and an arthritis medication to help your back pain.  4)  I will set you up with a mammogram appointment. 5)  Make an appt at the front to see me at my next available spot to do a PAP smear. 6)  -Dr. ONEIDA. Prescriptions: CYCLOBENZAPRINE  HCL 5 MG TABS (CYCLOBENZAPRINE  HCL) One tab by mouth  TID  #90 x 6   Entered and Authorized by:   Carla Petties MD   Signed by:   Carla Petties MD on 03/13/2009   Method used:   Electronically to        [facility] [phone]* (retail)       [address]       [place], [place]  [address]       Ph: [phone]       Fax: [phone]   RxID:   [id number] CELEBREX  200 MG CAPS (CELECOXIB ) One tab by mouth daily.  #30 x 6   Entered and Authorized by:   Carla Petties MD   Signed by:   Carla Petties MD on 03/13/2009   Method used:   Electronically to        [facility] [phone]* (retail)       [address]       [place], [place]  [address]       Ph: [phone]       Fax: [phone]   RxID:   [id number]   PAP Result Date:  08/04/1986 PAP Result:  normal PAP Next Due:  1 yr Last Creatinine:  1.02 (11/23/2008 9:12:00 PM) Creatinine Next Due: 1 yr Last Potassium:  4.0 (11/23/2008 9:12:00 PM) Potassium Next Due:  1 yr

## 2010-09-05 NOTE — Assessment & Plan Note (Signed)
 Summary: FU/KH   Vital Signs:  Patient profile:   55 year old female Menstrual status:  postmenopausal Height:      63.1 inches Weight:      274 pounds BMI:     48.56 Temp:     98.1 degrees F oral Pulse rate:   91 / minute BP sitting:   136 / 67  (left arm) Cuff size:   large  Vitals Entered By: Katie Mulberry LPN (July 11, 2009 2:31 PM) CC: f/u diabetes Is Patient Diabetic? Yes Did you bring your meter with you today? No Pain Assessment Patient in pain? yes     Location: headache   Primary Care Provider:  [name] MD  CC:  f/u diabetes.  History of Present Illness: DM2: brought meter:  7d avg: 148  14d avg: 153 28d avg: 151  Toenails:  finished 3 months of lamisil , no itching, still with black nails.  Mood:  No improvement on increased celexa  20.  HTN:  Elevated, ran out of metoprolol  some time ago and didn't know she needed to refill it.  Habits & Providers  Alcohol-Tobacco-Diet     Tobacco Status: never  Current Medications (verified): 1)  Lisinopril  40 Mg Tabs (Lisinopril ) .... 2 Tablets By Mouth Daily For Blood Pressure 2)  Hydrochlorothiazide  25 Mg Tabs (Hydrochlorothiazide ) .SABRA.. 1 Tablet By Mouth Daily For Blood Pressure 3)  Norvasc  10 Mg Tabs (Amlodipine  Besylate) .SABRA.. 1 Tablet By Mouth Daily For Blood Pressure 4)  Simvastatin  20 Mg Tabs (Simvastatin ) .SABRA.. 1 Tablet By Mouth Daily For Cholesterol 5)  Celexa  40 Mg Tabs (Citalopram  Hydrobromide) .... One Tab By Mouth Daily 6)  Celebrex  200 Mg Caps (Celecoxib ) .... One Tab By Mouth Daily. 7)  Metformin  Hcl 1000 Mg Tabs (Metformin  Hcl) .... One Tab By Mouth Bid 8)  Warfarin Sodium  5 Mg Tabs (Warfarin Sodium ) .... As Directed 9)  Metoprolol  Succinate 25 Mg Xr24h-Tab (Metoprolol  Succinate) .... Take One Tablet Once Daily 10)  Prodigy Blood Glucose Monitor W/device Kit (Blood Glucose Monitoring Suppl) .... Test Blood Glucose Up To Twice Daily. Dispense One Month Supply of Strips. 11)  Prodigy  Lancets 21g  Misc (Lancets) .... Dispense One Box.   Quantity For One Month Supply of Twice Daily Checking. 12)  Prodigy Blood Glucose Test  Strp (Glucose Blood) .... Dispense Quantity of Strips Sufficient For Twice Daily Checking.  1 Box  Allergies (verified): 1)  ! Darvocet-N 100 (Propoxyphene N-Apap)  Past History:  Past Medical History: Last updated: 06/14/2009 Hospitalized 05/2006 for hypertensive urgency and face numbness thought secondary to small vessel disease.     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    - MRA of neck shows moderate intracranial atheroscleric changes    - carotid dopplers: no R ICA stenosis, L ICA has 40-60% stenosis distally, vertebral artery flow is antegrade    - 2 D echo no clots, EF 55%  Stress test negative (10/2008) Dr. Levern.  Past Surgical History: Last updated: 07/31/2008 T&A at age 27 D&C for Hydatiform Mole Pregnancy- 1976 Carpal tunnel release right knee meniscus repair - 3/09 by Dr. Liam  Family History: Last updated: 07/31/2008 has 15 brothers and sister.  Some have HTN.  One sister died of lung cancer. mother- died of congestive heart failure at age 80. father- still alive, has diabetes & HTN  Social History: Last updated: 08/29/2008 High school education.  Has car & used to drive until she had a stroke.  Worked at [employer] at [employer].  Separated from husband for 26 years.  Has 3 children in 50s- 30s and raising 30 yo grandson.   Lives with grandson and youngest daughter.  Has boyfriend who she has ben dating for 8 years.  Boyfriend has lung cancer.  Denies alcohol, tobacco, and drug use.  Review of Systems       See  HPI  Physical Exam  General:  Well-developed,well-nourished,in no acute distress; alert,appropriate and cooperative throughout examination Lungs:  Normal respiratory effort, chest expands symmetrically. Lungs are clear to auscultation, no crackles or wheezes. Heart:  Normal rate and regular rhythm. S1 and S2 normal without gallop, murmur, click, rub or other extra sounds. Extremities:  Toenails still black, havent' grown out yet.  Psych:  depressed affect and flat affect.     Impression & Recommendations:  Problem # 1:  DIABETES MELLITUS, TYPE II (ICD-250.00) Assessment Improved Increase metformin  to 1000 two times a day.  RTC 2 months for A1c.  Her updated medication list for this problem includes:    Lisinopril  40 Mg Tabs (Lisinopril ) .SABRA... 2 tablets by mouth daily for blood pressure    Metformin  Hcl 1000 Mg Tabs (Metformin  hcl) ..... One tab by mouth bid  Orders: [facility]- Est  Level 4 (00785)  Problem # 2:  DERMATOPHYTOSIS OF FOOT (ICD-110.4) Assessment: Unchanged Will need to watch for improvement in nails.  Finished 12wks terbinafine .  Orders: [facility]- Est  Level 4 (00785)  Problem # 3:  DEPRESSION (ICD-311) Assessment: Unchanged Increase Celexa  to 40 once daily.    Her updated medication list for this problem includes:    Celexa  40 Mg Tabs (Citalopram  hydrobromide) ..... One tab by mouth daily  Problem # 4:  HYPERTENSION (ICD-401.9) Assessment: Unchanged Elevated, ran out of toprol .  Will refill and recheck at 2 month fu.  Her updated medication list for this problem includes:    Lisinopril  40 Mg Tabs (Lisinopril ) .SABRA... 2 tablets by mouth daily for blood pressure    Hydrochlorothiazide  25 Mg Tabs (Hydrochlorothiazide ) .SABRA... 1 tablet by mouth daily for blood pressure    Norvasc  10 Mg Tabs (Amlodipine  besylate) .SABRA... 1 tablet by mouth daily for blood pressure    Metoprolol  Succinate 25 Mg Xr24h-tab (Metoprolol  succinate) .SABRA... Take one tablet once daily  Orders: [facility]-  Est  Level 4 (00785)  Complete Medication List: 1)  Lisinopril  40 Mg Tabs (Lisinopril ) .... 2 tablets by mouth daily for blood pressure 2)  Hydrochlorothiazide  25 Mg Tabs (Hydrochlorothiazide ) .SABRA.. 1 tablet by mouth daily for blood pressure 3)  Norvasc  10 Mg Tabs (Amlodipine  besylate) .SABRA.. 1 tablet by mouth daily for blood pressure 4)  Simvastatin  20 Mg Tabs (Simvastatin ) .SABRA.. 1 tablet by mouth daily for cholesterol 5)  Celexa  40 Mg Tabs (Citalopram  hydrobromide) .... One tab by mouth daily 6)  Celebrex  200 Mg Caps (Celecoxib ) .... One tab by mouth daily. 7)  Metformin  Hcl 1000 Mg Tabs (Metformin  hcl) .... One tab by mouth bid 8)  Warfarin Sodium  5 Mg Tabs (Warfarin sodium ) .... As directed 9)  Metoprolol  Succinate 25 Mg Xr24h-tab (Metoprolol  succinate) .... Take one tablet once daily 10)  Prodigy Blood Glucose Monitor W/device Kit (Blood glucose monitoring suppl) .... Test blood  glucose up to twice daily. dispense one month supply of strips. 11)  Prodigy Lancets 21g Misc (Lancets) .... Dispense one box.   quantity for one month supply of twice daily checking. 12)  Prodigy Blood Glucose Test Strp (Glucose blood) .... Dispense quantity of strips sufficient for twice daily checking.  1 box  Patient Instructions: 1)  Good to see you , congratulations on your sugars. 2)  Refilled metoprolol , fill and continue to take 3)  increased celexa  to 40mg  (take two 20mg  tabs until you run out then fill the 40mg  form) 4)  Increase metformin  to two 500mg  tabs two times a day, when you run out, go to [facility] and fill the new 1000mg  tab which you will take one tab two times a day. 5)  Come back to see me in 2 months to check your A1c. 6)  -Dr. ONEIDA. Prescriptions: METOPROLOL  SUCCINATE 25 MG XR24H-TAB (METOPROLOL  SUCCINATE) Take one tablet once daily  #90 x 6   Entered and Authorized by:   [name] MD   Signed by:   [name] MD on 07/11/2009   Method used:   Electronically to         [facility] [phone]* (retail)       [address]       [place], [place]  [address]       Ph: [phone]       Fax: [phone]   RxID:   [id number] METFORMIN  HCL 1000 MG TABS (METFORMIN  HCL) One tab by mouth BID  #180 x 6   Entered and Authorized by:   [name] MD   Signed by:   [name] MD on 07/11/2009   Method used:   Electronically to        [facility] [phone]* (retail)       [address]       [place], [place]  [address]       Ph: [phone]       Fax: [phone]   RxID:   [id number] CELEXA  40 MG TABS (CITALOPRAM  HYDROBROMIDE) One tab by mouth daily  #90 x 6   Entered and Authorized by:   [name] MD   Signed by:   [name] MD on 07/11/2009   Method used:   Electronically to        [facility] [phone]* (retail)       [address]       [place], [place]  [address]       Ph: [phone]       Fax: [phone]   RxID:   [id number]    Prevention & Chronic Care Immunizations   Influenza vaccine: Not documented    Tetanus booster: Not documented    Pneumococcal vaccine: Not documented  Colorectal Screening   Hemoccult: normal  (06/04/2005)   Hemoccult due: Not Indicated    Colonoscopy: normal  (06/04/2005)   Colonoscopy due: 06/05/2015  Other Screening   Pap smear: normal  (08/04/1986)   Pap smear due: 08/05/1987    Mammogram: Not documented   Smoking status: never  (07/11/2009)  Diabetes Mellitus   HgbA1C: 8.2  (06/02/2009)   Hemoglobin A1C due: 09/02/2009    Eye exam: Not documented    Foot exam: yes  (06/12/2009)   Foot exam action/deferral: Do today   High risk foot: Not documented   Foot care education: Not documented   Foot exam due: 06/12/2010    Urine microalbumin/creatinine ratio: Not documented  Urine microalbumin/cr due: 08/15/2009    Diabetes flowsheet reviewed?: Yes   Progress toward A1C goal: Unchanged  Lipids   Total Cholesterol: 138   (11/23/2008)   LDL: 48  (06/03/2009)   LDL Direct: Not documented   HDL: 40  (06/03/2009)   Triglycerides: 101  (11/23/2008)    SGOT (AST): 27  (03/13/2009)   SGPT (ALT): 37  (03/13/2009)   Alkaline phosphatase: 83  (03/13/2009)   Total bilirubin: 0.3  (03/13/2009)    Lipid flowsheet reviewed?: Yes   Progress toward LDL goal: At goal  Hypertension   Last Blood Pressure: 136 / 67  (07/11/2009)   Serum creatinine: 1.04  (03/13/2009)   Serum potassium 4.1  (03/13/2009)    Hypertension flowsheet reviewed?: Yes   Progress toward BP goal: At goal  Self-Management Support :   Personal Goals (by the next clinic visit) :     Personal A1C goal: 8  (06/12/2009)     Personal blood pressure goal: 130/80  (06/12/2009)     Personal LDL goal: 100  (06/12/2009)    Diabetes self-management support: Written self-care plan  (07/11/2009)   Diabetes care plan printed    Hypertension self-management support: Written self-care plan  (07/11/2009)   Hypertension self-care plan printed.    Hypertension self-management support not done because: Good outcomes  (06/12/2009)    Lipid self-management support: Written self-care plan  (07/11/2009)   Lipid self-care plan printed.    Lipid self-management support not done because: Good outcomes  (06/12/2009)

## 2010-09-05 NOTE — Consult Note (Signed)
 Summary: [facility]  [facility]   Imported By: Karna Seminole 07/11/2009 11:17:50  _____________________________________________________________________  External Attachment:    Type:   Image     Comment:   External Document

## 2010-09-05 NOTE — Letter (Signed)
Summary: Lipid Letter  [facility]  [address]   [place], [place] [address]   Phone: [phone]  Fax: [phone]    07/31/2010  [name] [address] [place], [place]  [address]  Dear [name]:  We have carefully reviewed your last lipid profile from 06/03/2009 and the results are noted below with a summary of recommendations for lipid management.    LDL "bad" Cholesterol:   107     Goal: <100    You are close but not ideal, increase your simvastatin.  Take TWO of your 20mg  tabs daily until you run out, then go to [facility] and get the new 40mg  pills and take them daily, they have already been called in.    TLC Diet (Therapeutic Lifestyle Change): Saturated Fats & Transfatty acids should be kept < 7% of total calories ***Reduce Saturated Fats Polyunstaurated Fat can be up to 10% of total calories Monounsaturated Fat Fat can be up to 20% of total calories Total Fat should be no greater than 25-35% of total calories Carbohydrates should be 50-60% of total calories Protein should be approximately 15% of total calories Fiber should be at least 20-30 grams a day ***Increased fiber may help lower LDL Total Cholesterol should be < 200mg /day Consider adding plant stanol/sterols to diet (example: Benacol spread) ***A higher intake of unsaturated fat may reduce Triglycerides and Increase HDL    Adjunctive Measures (may lower LIPIDS and reduce risk of Heart Attack) include: Aerobic Exercise (20-30 minutes 3-4 times a week) Limit Alcohol Consumption Weight Reduction Aspirin 75-81 mg a day by mouth (if not allergic or contraindicated) Dietary Fiber 20-30 grams a day by mouth     Current Medications: 1)    Lisinopril 40 Mg Tabs (Lisinopril) .... 2 tablets by mouth daily for blood pressure 2)    Hydrochlorothiazide 25 Mg Tabs (Hydrochlorothiazide) .Marland Kitchen.. 1 tablet by mouth daily for blood pressure 3)    Norvasc 10 Mg Tabs (Amlodipine besylate) .Marland Kitchen.. 1 tablet by mouth  daily for blood pressure 4)    Simvastatin 40 Mg Tabs (Simvastatin) .... One tab by mouth daily 5)    Celexa 40 Mg Tabs (Citalopram hydrobromide) .... One tab by mouth daily 6)    Celebrex 200 Mg Caps (Celecoxib) .... One tab by mouth daily. 7)    Metformin Hcl 1000 Mg Tabs (Metformin hcl) .... One tab po qam, 1/2  tab by mouth qpm 8)    Warfarin Sodium 5 Mg Tabs (Warfarin sodium) .... (one tab) 5mg  tues & fri; 7.5mg  (one and a half tabs) other days 9)    Metoprolol Succinate 25 Mg Xr24h-tab (Metoprolol succinate) .... Take one tablet once daily 10)    Prodigy Blood Glucose Monitor W/device Kit (Blood glucose monitoring suppl) .... Test blood glucose up to twice daily. dispense one month supply of strips. 11)    Prodigy Lancets 21g  Misc (Lancets) .... Dispense one box.   quantity for one month supply of twice daily checking. 12)    Prodigy Blood Glucose Test  Strp (Glucose blood) .... Dispense quantity of strips sufficient for twice daily checking.  1 box 13)    Nystatin 100000 Unit/gm Powd (Nystatin) .... Apply powder under breasts three times a day. 14)    Glipizide 2.5 Mg Xr24h-tab (Glipizide) .... One tab by mouth with breakfast 15)    Prilosec 20 Mg Cpdr (Omeprazole) .... One tab by mouth daily 16)    Tour manager  .... Use in shower to  prevent falls 17)    Keflex 500 Mg Caps (Cephalexin) .... One tab by mouth two times a day x 7d  If you have any questions, please call. We appreciate being able to work with you.   Sincerely,    [facility] Rodney Langton MD  Appended Document: Lipid Letter mailed

## 2010-09-05 NOTE — Progress Notes (Signed)
 Summary: Prior Authorization Approved for Celebrex  200 daily.   Phone Note Outgoing Call Call back at [phone]   Summary of Call: Called for prior authorization for Celebrex  200 daily.  Spoke to [employer].  Auth approved.  Note faxed to pharmacy to resubmit claim.  Initial call taken by: [name] MD,  March 26, 2009 2:02 PM

## 2010-09-05 NOTE — Progress Notes (Signed)
 Summary: phn msg   Phone Note Other Incoming Call back at [phone]   Caller: [facility] Summary of Call: Wondering if Dr. Curtis would like for them to [facility] and also they want to do remote monitering for tele health.  Initial call taken by: Madelin Daring,  July 03, 2009 10:20 AM  Follow-up for Phone Call        will forward message to MD. Follow-up by: [name] RN,  July 03, 2009 2:42 PM  Additional Follow-up for Phone Call Additional follow up Details #1::        Would prefer her to come here so INRs could be in our system, but if its a lot easier for her to do it through [facility] then thats fine too. Additional Follow-up by: Debby Curtis MD,  July 03, 2009 8:21 PM    Additional Follow-up for Phone Call Additional follow up Details #2::    spoke with [place] and gave message from MD. She states patient has transportation problem sometimes and she was trying to help her by doing it in the home. She will let patient know what MD has said .  Follow-up by: [name] RN,  July 04, 2009 9:24 AM  Additional Follow-up for Phone Call Additional follow up Details #3:: Details for Additional Follow-up Action Taken: Ok with me for her to have it done by [facility], need to have all INR results sent to [facility] though. Additional Follow-up by: Debby Curtis MD,  July 04, 2009 1:47 PM  spoken with [place] and she will do next PT/INR 07/16/2009. she will call with report on day it is done then will follow up with a fax. [name] RN  July 04, 2009 3:31 PM

## 2010-09-05 NOTE — Assessment & Plan Note (Signed)
Summary: cpe,df   Vital Signs:  Patient profile:   55 year old female Menstrual status:  postmenopausal Weight:      281.5 pounds Temp:     98 degrees F oral Pulse rate:   76 / minute Pulse rhythm:   regular BP sitting:   138 / 100  (left arm) Cuff size:   large  Vitals Entered By: Loralee Pacas CMA (January 21, 2010 10:54 AM) CC: cpe   Primary Care Provider:  Rodney Langton MD  CC:  cpe.  History of Present Illness: DM2: Has not seen Dr. Nile Riggs for Diabetic eye exam.  I have reminded her multiple times.  HTN:  Took all BP meds, had 3 family members die from shootings, beatings, robberies.  She is stressed and doesn't want to increase any of her meds.  A fib:  Missed last INR check appt.  Breast Screening:  hasn't gotten mammogram yet, has been reminded multiple times.  Current Medications (verified): 1)  Lisinopril 40 Mg Tabs (Lisinopril) .... 2 Tablets By Mouth Daily For Blood Pressure 2)  Hydrochlorothiazide 25 Mg Tabs (Hydrochlorothiazide) .Marland Kitchen.. 1 Tablet By Mouth Daily For Blood Pressure 3)  Norvasc 10 Mg Tabs (Amlodipine Besylate) .Marland Kitchen.. 1 Tablet By Mouth Daily For Blood Pressure 4)  Simvastatin 20 Mg Tabs (Simvastatin) .Marland Kitchen.. 1 Tablet By Mouth Daily For Cholesterol 5)  Celexa 40 Mg Tabs (Citalopram Hydrobromide) .... One Tab By Mouth Daily 6)  Celebrex 200 Mg Caps (Celecoxib) .... One Tab By Mouth Daily. 7)  Metformin Hcl 1000 Mg Tabs (Metformin Hcl) .... 1/2  Tab By Mouth Two Times A Day 8)  Warfarin Sodium 5 Mg Tabs (Warfarin Sodium) .... (One Tab) 5mg  Tues & Fri; 7.5mg  (One and A Half Tabs) Other Days 9)  Metoprolol Succinate 25 Mg Xr24h-Tab (Metoprolol Succinate) .... Take One Tablet Once Daily 10)  Prodigy Blood Glucose Monitor W/device Kit (Blood Glucose Monitoring Suppl) .... Test Blood Glucose Up To Twice Daily. Dispense One Month Supply of Strips. 11)  Prodigy Lancets 21g  Misc (Lancets) .... Dispense One Box.   Quantity For One Month Supply of Twice Daily  Checking. 12)  Prodigy Blood Glucose Test  Strp (Glucose Blood) .... Dispense Quantity of Strips Sufficient For Twice Daily Checking.  1 Box 13)  Nystatin 100000 Unit/gm Powd (Nystatin) .... Apply Powder Under Breasts Three Times A Day. 14)  Glipizide 2.5 Mg Xr24h-Tab (Glipizide) .... One Tab By Mouth With Breakfast 15)  Prilosec 20 Mg Cpdr (Omeprazole) .... One Tab By Mouth Daily 16)  Shower Bench .... Use in Shower To Prevent Falls  Allergies (verified): 1)  ! Darvocet-N 100  Past History:  Past Medical History: Last updated: 06/14/2009 Hospitalized 05/2006 for hypertensive urgency and face numbness thought secondary to small vessel disease.     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    - MRA of neck shows moderate intracranial atheroscleric changes    - carotid dopplers: no R ICA stenosis, L ICA has 40-60% stenosis distally, vertebral artery flow is antegrade    - 2 D echo no clots, EF 55%  Stress test negative (10/2008) Dr. Sharyn Lull.  Review of Systems       See HPI  Physical  Exam  General:  Well-developed,well-nourished,in no acute distress; alert,appropriate and cooperative throughout examination Head:  Normocephalic and atraumatic without obvious abnormalities.  Eyes:  No corneal or conjunctival inflammation noted. EOMI. Perrla.  Ears:  External ear exam shows no significant lesions or deformities.  Nose:  External nasal examination shows no deformity or inflammation. Mouth:  Oral mucosa and oropharynx without lesions or exudates.  Teeth in good repair. Neck:  No deformities, masses, or tenderness noted. Breasts:  No mass, nodules, thickening, tenderness, bulging, retraction,  inflamation, nipple discharge or skin changes noted.   Lungs:  Normal respiratory effort, chest expands symmetrically. Lungs are clear to auscultation, no crackles or wheezes. Heart:  Normal rate and regular rhythm. S1 and S2 normal without gallop, murmur, click, rub or other extra sounds. Abdomen:  Bowel sounds positive,abdomen soft and non-tender without masses, organomegaly or hernias noted. Genitalia:  Normal introitus for age, no external lesions, no vaginal discharge, mucosa pink and moist, no vaginal or cervical lesions, no vaginal atrophy, no friaility or hemorrhage, normal uterus size and position, no adnexal masses or tenderness Extremities:  No clubbing, cyanosis, edema, or deformity noted with normal full range of motion of all joints.   Neurologic:  Grossly Non-focal. Skin:  Maceration under breast folds.  Very moist.    Impression & Recommendations:  Problem # 1:  SCREENING FOR MALIGNANT NEOPLASM OF THE CERVIX (ICD-V76.2) Assessment Unchanged PAP today.  Orders: Pap Smear-[facility] (04540-98119) [facility]- Est  Level 4 (14782)  Problem # 2:  DIABETES MELLITUS, TYPE II (ICD-250.00) Assessment: Unchanged UA today for prot, needs to make appt for Eye exam.  Will not remind her again.  Her updated medication list for this problem includes:    Lisinopril 40 Mg Tabs (Lisinopril) .Marland Kitchen... 2 tablets by mouth daily for blood pressure    Metformin Hcl 1000 Mg Tabs (Metformin hcl) .Marland Kitchen... 1/2  tab by mouth two times a day    Glipizide 2.5 Mg Xr24h-tab (Glipizide) ..... One tab by mouth with breakfast  Orders: Urinalysis-[facility] (00000)  Problem # 3:  COUMADIN THERAPY (ICD-V58.61) Assessment: Unchanged INR today.  Orders: INR/PT-[facility] (95621) [facility]- Est  Level 4 (99214)  Problem # 4:  DERMATOPHYTOSIS OF FOOT (ICD-110.4) Assessment: Improved Improving, thickened yellowish nail growing out.  Her updated medication list for this problem includes:    Nystatin 100000 Unit/gm Powd (Nystatin) .Marland Kitchen...  Apply powder under breasts three times a day.  Orders: INR/PT-[facility] (30865)  Problem # 5:  UNSPECIFIED BREAST SCREENING (ICD-V76.10) Assessment: Unchanged Has not got for mammo appt yet.  Will not remind her again.  Orders: [facility]- Est  Level 4 (78469)  Problem # 6:  HYPERTENSION (ICD-401.9) Assessment: Unchanged Elevated but lots of domestic stress.  No acute changes in antihypertensive therapy.  Her updated medication list for this problem includes:    Lisinopril 40 Mg Tabs (Lisinopril) .Marland Kitchen... 2 tablets by mouth daily for blood pressure    Hydrochlorothiazide 25 Mg Tabs (Hydrochlorothiazide) .Marland Kitchen... 1 tablet by mouth daily for blood pressure    Norvasc 10 Mg Tabs (Amlodipine besylate) .Marland Kitchen... 1 tablet by mouth daily for blood pressure    Metoprolol Succinate 25 Mg Xr24h-tab (Metoprolol succinate) .Marland Kitchen... Take one tablet once daily  Orders: [facility]- Est  Level 4 (62952)  Complete Medication List: 1)  Lisinopril 40 Mg Tabs (Lisinopril) .... 2 tablets by mouth daily for blood pressure 2)  Hydrochlorothiazide 25 Mg Tabs (Hydrochlorothiazide) .Marland Kitchen.. 1 tablet by mouth daily for blood pressure 3)  Norvasc 10 Mg Tabs (Amlodipine besylate) .Marland KitchenMarland KitchenMarland Kitchen  1 tablet by mouth daily for blood pressure 4)  Simvastatin 20 Mg Tabs (Simvastatin) .Marland Kitchen.. 1 tablet by mouth daily for cholesterol 5)  Celexa 40 Mg Tabs (Citalopram hydrobromide) .... One tab by mouth daily 6)  Celebrex 200 Mg Caps (Celecoxib) .... One tab by mouth daily. 7)  Metformin Hcl 1000 Mg Tabs (Metformin hcl) .... 1/2  tab by mouth two times a day 8)  Warfarin Sodium 5 Mg Tabs (Warfarin sodium) .... (one tab) 5mg  tues & fri; 7.5mg  (one and a half tabs) other days 9)  Metoprolol Succinate 25 Mg Xr24h-tab (Metoprolol succinate) .... Take one tablet once daily 10)  Prodigy Blood Glucose Monitor W/device Kit (Blood glucose monitoring suppl) .... Test blood glucose up to twice daily. dispense one month supply of strips. 11)  Prodigy Lancets 21g Misc (Lancets)  .... Dispense one box.   quantity for one month supply of twice daily checking. 12)  Prodigy Blood Glucose Test Strp (Glucose blood) .... Dispense quantity of strips sufficient for twice daily checking.  1 box 13)  Nystatin 100000 Unit/gm Powd (Nystatin) .... Apply powder under breasts three times a day. 14)  Glipizide 2.5 Mg Xr24h-tab (Glipizide) .... One tab by mouth with breakfast 15)  Prilosec 20 Mg Cpdr (Omeprazole) .... One tab by mouth daily 16)  Tour manager  .... Use in shower to prevent falls  Patient Instructions: 1)  Great to see you today. 2)  PAP done. 3)  Get your mammogram. 4)  make appt with Dr. Nile Riggs for diabetic eye exam. 5)  Apply nystatin powder under breasts three times a day. 6)  I am VERY sorry about your losses, please come see me if you feel you would like to talk about it. 7)  -Dr. Karie Schwalbe. Prescriptions: NYSTATIN 100000 UNIT/GM POWD (NYSTATIN) Apply powder under breasts three times a day.  #1 month QS x 3   Entered and Authorized by:   Rodney Langton MD   Signed by:   Rodney Langton MD on 01/21/2010   Method used:   Faxed to ...       [facility] [employer] (mail-order)       [address] [place], [place]  [address]       Ph: [phone]       Fax: [phone]   RxID:   [id number]   Mammogram Next Due:  Not Indicated Pt has not gone for mammo multiple times.  reminded her multiple times.  Diabetes Eye Exam Due:  Not Indicated Last HGBA1C:  6.3 (11/05/2009 3:56:12 PM) HGBA1C Next Due:  3 mo Microalbumin Result Date:  01/21/2010 Microalbumin Result:  normal Microalbumin Next Due:  1 yr Last Creatinine:  1.04 (03/13/2009 9:16:00 PM) Creatinine Next Due: 1 yr Last Potassium:  4.1 (03/13/2009 9:16:00 PM) Potassium Next Due:  1 yr Pt has not gone for eye exam multiple times, cancelled most recent appt that we set up.  reminded her multiple times.      ANTICOAGULATION RECORD PREVIOUS REGIMEN & LAB RESULTS Anticoagulation Diagnosis:   Atrial fibrillation on  06/07/2009 Previous INR Goal Range:  2-3 on  06/07/2009 Previous INR:  3.2 on  11/05/2009 Previous Coumadin Dose(mg):  5MG  Tablets on  06/07/2009 Previous Regimen:  continue:  5 mg - Tues & Fri;   7.5 mg - other days on  11/05/2009 Previous Coagulation Comments:  Pt to eat greens 2x week. on  10/25/2009  NEW REGIMEN & LAB RESULTS Current INR: 2.6 Regimen: continue  5mg  Thurs & Sun; 7.5mg  other days  Provider: Dr. Benjamin Stain Repeat testing in: 4 weeks Other Comments: ...........test performed by...........Marland KitchenTerese Door, CMA   Dose has been reviewed with patient or caretaker during this visit. Reviewed by: D. Kathrine Cords, [place]   Laboratory Results   Urine Tests  Date/Time Received: January 21, 2010 11:54 AM  Date/Time Reported: January 21, 2010 12:23 PM   Routine Urinalysis   Color: yellow Appearance: Clear Glucose: negative   (Normal Range: Negative) Bilirubin: negative   (Normal Range: Negative) Ketone: negative   (Normal Range: Negative) Spec. Gravity: >=1.030   (Normal Range: 1.003-1.035) Blood: moderate   (Normal Range: Negative) pH: 5.0   (Normal Range: 5.0-8.0) Protein: negative   (Normal Range: Negative) Urobilinogen: 1.0   (Normal Range: 0-1) Nitrite: negative   (Normal Range: Negative) Leukocyte Esterace: moderate   (Normal Range: Negative)  Urine Microscopic WBC/HPF: 5-10 RBC/HPF: 0-2 Bacteria/HPF: 2+ Epithelial/HPF: 5-10 Other: mod amorphous  Microalbumin (urine): normal mg/L   Comments: 4cc spun for micro. ...........test performed by...........Marland KitchenTerese Door, CMA    Blood Tests      INR: 2.6   (Normal Range: 0.88-1.12   Therap INR: 2.0-3.5)               Hypertension   Last Blood Pressure: 138 / 100  (01/21/2010)   Serum creatinine: 1.04  (03/13/2009)   Serum potassium 4.1  (03/13/2009)    Hypertension flowsheet reviewed?: Yes   Progress toward BP goal: Deteriorated

## 2010-09-05 NOTE — Assessment & Plan Note (Signed)
Summary: hfu,df   Vital Signs:  Patient profile:   55 year old female Menstrual status:  postmenopausal Weight:      276 pounds Pulse rate:   92 / minute BP sitting:   136 / 86  (right arm)  Vitals Entered By: Arlyss Repress CMA, (August 23, 2009 1:46 PM) CC: hospital f/up. discuss blood sugars in am: 140-160, in pm 130-190. c/o diarrhea from metformin. Is Patient Diabetic? Yes Pain Assessment Patient in pain? no        Primary Care Provider:  Rodney Langton MD  CC:  hospital f/up. discuss blood sugars in am: 140-160 and in pm 130-190. c/o diarrhea from metformin.Marland Kitchen  History of Present Illness: cc: hosp f/u for palpitations  55 y/o F was admitted for overnight observation at [facility] for palpitations.  She has a history of palpitations. She was found to be in NSR and discharged home with  Metoprolol 25mg  two times a day  for rate control.  Pt has not picked up this medications because she does not have the money to pay for it.   Currently feeling fine, no complaints of palpitations for chest pain.    DM:  During hosp she complained of GI s/e of metformin.  We decided to decrease Metformin from 1000mg  bid to 500mg  bid and added small dose glipizide 2.5mg . PCP can increase glipizide as needed.  Pt has not started taking glipizide but is breaking her metformin in half.  Currently no GI complaints.    HTN: We added HCTZ 12.5 for better control.  Pt has not picked this up yet.  No chest pain.  GERD: During hosp she c/o reflux-like symptoms.  She did well with protonix during hosp stay, but has not picked upt Omeprazole 40 mg p.o. daily.    Habits & Providers  Alcohol-Tobacco-Diet     Tobacco Status: never  Allergies: 1)  ! Darvocet-N 100 (Propoxyphene N-Apap)  Physical Exam  General:  Well-developed,well-nourished,in no acute distress; alert,appropriate and cooperative throughout examination. vitals reviewed.   Lungs:  Normal respiratory effort, chest expands symmetrically.  Lungs are clear to auscultation, no crackles or wheezes. Heart:  Normal rate and regular rhythm. S1 and S2 normal without gallop, murmur, click, rub or other extra sounds. Abdomen:  Bowel sounds positive,abdomen soft and non-tender without masses, organomegaly or hernias noted. obese Pulses:  R radial normal and L radial normal.   Extremities:  No clubbing, cyanosis, edema, or deformity noted with normal full range of motion of all joints.   Neurologic:  alert & oriented X3.     Impression & Recommendations:  Problem # 1:  ATRIAL FIBRILLATION (ICD-427.31) Assessment New Pt currently asymptomatic.  EKG in ED NSR.  Pt advised to start Metoprolol 25mg  bg as soon as possible for better rate control and blood pressure control.  Pt to rtc in 2-3 wks to see PCP.  INR checked today.   Her updated medication list for this problem includes:    Norvasc 10 Mg Tabs (Amlodipine besylate) .Marland Kitchen... 1 tablet by mouth daily for blood pressure    Warfarin Sodium 5 Mg Tabs (Warfarin sodium) .Marland Kitchen... As directed    Metoprolol Succinate 25 Mg Xr24h-tab (Metoprolol succinate) .Marland Kitchen... Take one tablet once daily  Orders: INR/PT-[facility] (88416) [facility]- Est Level  3 (60630)  Complete Medication List: 1)  Lisinopril 40 Mg Tabs (Lisinopril) .... 2 tablets by mouth daily for blood pressure 2)  Hydrochlorothiazide 25 Mg Tabs (Hydrochlorothiazide) .Marland Kitchen.. 1 tablet by mouth daily for blood pressure  3)  Norvasc 10 Mg Tabs (Amlodipine besylate) .Marland Kitchen.. 1 tablet by mouth daily for blood pressure 4)  Simvastatin 20 Mg Tabs (Simvastatin) .Marland Kitchen.. 1 tablet by mouth daily for cholesterol 5)  Celexa 40 Mg Tabs (Citalopram hydrobromide) .... One tab by mouth daily 6)  Celebrex 200 Mg Caps (Celecoxib) .... One tab by mouth daily. 7)  Metformin Hcl 1000 Mg Tabs (Metformin hcl) .... 1/2  tab by mouth two times a day 8)  Warfarin Sodium 5 Mg Tabs (Warfarin sodium) .... As directed 9)  Metoprolol Succinate 25 Mg Xr24h-tab (Metoprolol succinate) .... Take  one tablet once daily 10)  Prodigy Blood Glucose Monitor W/device Kit (Blood glucose monitoring suppl) .... Test blood glucose up to twice daily. dispense one month supply of strips. 11)  Prodigy Lancets 21g Misc (Lancets) .... Dispense one box.   quantity for one month supply of twice daily checking. 12)  Prodigy Blood Glucose Test Strp (Glucose blood) .... Dispense quantity of strips sufficient for twice daily checking.  1 box 13)  Nystatin 100000 Unit/gm Oint (Nystatin) .... Apply to affected area two times a day to three times a day 14)  Glipizide 2.5 Mg Xr24h-tab (Glipizide) .... One tab by mouth with breakfast 15)  Microzide 12.5 Mg Caps (Hydrochlorothiazide) .Marland Kitchen.. 1 tab by mouth daily  Patient Instructions: 1)  Please schedule a follow-up appointment in 2-3  weeks to see Dr Roderic Palau.  2)  Please pick up your new medications that was prescribed to you in the hospital.  3)  Please call Dr Annitta Jersey office for appointment. 4)  For your yeast infection you can use Nystatin ointment 2-3 times each day until infection is gone. Prescriptions: NYSTATIN 100000 UNIT/GM OINT (NYSTATIN) Apply to affected area two times a day to three times a day  #1 x 1   Entered and Authorized by:   Angeline Slim MD   Signed by:   Angeline Slim MD on 08/23/2009   Method used:   Electronically to        [facility] [id number]* (retail)       [address]       [place], [place]  [address]       Ph: [phone]       Fax: [phone]   RxID:   [id number]    ANTICOAGULATION RECORD PREVIOUS REGIMEN & LAB RESULTS Anticoagulation Diagnosis:  Atrial fibrillation on  06/07/2009 Previous INR Goal Range:  2-3 on  06/07/2009 Previous INR:  1.8 on  08/09/2009 Previous Coumadin Dose(mg):  5MG  Tablets on  06/07/2009 Previous Regimen:  7.5 mg - Mon & Thurs;  5 mg - other days on  08/09/2009  NEW REGIMEN & LAB RESULTS Current INR: 1.6 Regimen: 7.5mg  M,W,F; 5mg  other days  Provider: Dr. Benjamin Stain Repeat testing  in: 2 weeks Other Comments: ...........test performed by...........Marland KitchenTerese Door, CMA   Dose has been reviewed with patient or caretaker during this visit. Reviewed by: Dr. Janalyn Harder

## 2010-09-05 NOTE — Assessment & Plan Note (Signed)
Summary: f/up,tcb   Vital Signs:  Patient profile:   55 year old female Menstrual status:  postmenopausal Height:      63.1 inches Weight:      280.0 pounds BMI:     49.62 Temp:     98.1 degrees F oral Pulse rate:   70 / minute BP sitting:   138 / 89  (left arm)  Vitals Entered By: Gladstone Pih (November 05, 2009 3:57 PM) CC: F/U Is Patient Diabetic? Yes   Primary Care Provider:  Rodney Langton MD  CC:  F/U.  History of Present Illness: HTN:  Bp hgh today  DM2:  Due for A1c (normal today), eye exam, UA.  Due for PAP  Due for mammo.  Needs refills on coumadin, zocor, glipizide, metformin, strips, nystatin.  Would like a shower bench for stabiilty when bathing.  Habits & Providers  Alcohol-Tobacco-Diet     Tobacco Status: never  Current Medications (verified): 1)  Lisinopril 40 Mg Tabs (Lisinopril) .... 2 Tablets By Mouth Daily For Blood Pressure 2)  Hydrochlorothiazide 25 Mg Tabs (Hydrochlorothiazide) .Marland Kitchen.. 1 Tablet By Mouth Daily For Blood Pressure 3)  Norvasc 10 Mg Tabs (Amlodipine Besylate) .Marland Kitchen.. 1 Tablet By Mouth Daily For Blood Pressure 4)  Simvastatin 20 Mg Tabs (Simvastatin) .Marland Kitchen.. 1 Tablet By Mouth Daily For Cholesterol 5)  Celexa 40 Mg Tabs (Citalopram Hydrobromide) .... One Tab By Mouth Daily 6)  Celebrex 200 Mg Caps (Celecoxib) .... One Tab By Mouth Daily. 7)  Metformin Hcl 1000 Mg Tabs (Metformin Hcl) .... 1/2  Tab By Mouth Two Times A Day 8)  Warfarin Sodium 5 Mg Tabs (Warfarin Sodium) .... As Directed 9)  Metoprolol Succinate 25 Mg Xr24h-Tab (Metoprolol Succinate) .... Take One Tablet Once Daily 10)  Prodigy Blood Glucose Monitor W/device Kit (Blood Glucose Monitoring Suppl) .... Test Blood Glucose Up To Twice Daily. Dispense One Month Supply of Strips. 11)  Prodigy Lancets 21g  Misc (Lancets) .... Dispense One Box.   Quantity For One Month Supply of Twice Daily Checking. 12)  Prodigy Blood Glucose Test  Strp (Glucose Blood) .... Dispense Quantity of  Strips Sufficient For Twice Daily Checking.  1 Box 13)  Nystatin 100000 Unit/gm Oint (Nystatin) .... Apply To Affected Area Two Times A Day To Three Times A Day 14)  Glipizide 2.5 Mg Xr24h-Tab (Glipizide) .... One Tab By Mouth With Breakfast 15)  Microzide 12.5 Mg Caps (Hydrochlorothiazide) .Marland Kitchen.. 1 Tab By Mouth Daily 16)  Prilosec 20 Mg Cpdr (Omeprazole) .... One Tab By Mouth Daily 17)  Shower Bench .... Use in Shower To Prevent Falls  Allergies (verified): 1)  ! Darvocet-N 100  Review of Systems       See HPI  Physical Exam  General:  Well-developed,well-nourished,in no acute distress; alert,appropriate and cooperative throughout examination Lungs:  Normal respiratory effort, chest expands symmetrically. Lungs are clear to auscultation, no crackles or wheezes. Heart:  Normal rate and regular rhythm. S1 and S2 normal without gallop, murmur, click, rub or other extra sounds. Extremities:  No clubbing, cyanosis, edema, or deformity noted with normal full range of motion of all joints.  Healthy appearing toenails starting to replace thickened black nails proximal to distal.  Diabetes Management Exam:    Foot Exam (with socks and/or shoes not present):       Sensory-Monofilament:          Left foot: abnormal          Right foot: abnormal   Impression & Recommendations:  Problem # 1:  DIABETES MELLITUS, TYPE II (ICD-250.00) Assessment Improved Abnl foot exam, foot care education completed.  A1c WNL.  Will call Dr. Nile Riggs for eye exam appt.   Refilled meds.  Needs UA at next visit.  Her updated medication list for this problem includes:    Lisinopril 40 Mg Tabs (Lisinopril) .Marland Kitchen... 2 tablets by mouth daily for blood pressure    Metformin Hcl 1000 Mg Tabs (Metformin hcl) .Marland Kitchen... 1/2  tab by mouth two times a day    Glipizide 2.5 Mg Xr24h-tab (Glipizide) ..... One tab by mouth with breakfast  Orders: [facility]- Est Level  5 (78295)  Problem # 2:  COUMADIN THERAPY (ICD-V58.61) Assessment:  Deteriorated Refilled coumadin, inr supratherapeutic, will recheck.  Pt missed eating greens as she usually does.  Recheck in 2 weeks.  Orders: INR/PT-[facility] (62130) [facility]- Est Level  5 (86578)  Problem # 3:  DERMATOPHYTOSIS OF FOOT (ICD-110.4) Assessment: Improved Relatively healthy nail appears to be growing out and replacing black fungal nail.  Her updated medication list for this problem includes:    Nystatin 100000 Unit/gm Oint (Nystatin) .Marland Kitchen... Apply to affected area two times a day to three times a day  Orders: [facility]- Est Level  5 (46962)  Problem # 4:  UNSPECIFIED BREAST SCREENING (ICD-V76.10) Assessment: Unchanged Pt knows she is delinquent on mammo, will call imaging center for mammo appt ASAP.  Orders: [facility]- Est Level  5 (95284)  Problem # 5:  HYPERTENSION (ICD-401.9) Assessment: Unchanged BP mildly elevated.  Unsure of whether she is taking HCTZ as directed.  Recheck at next visit.  Her updated medication list for this problem includes:    Lisinopril 40 Mg Tabs (Lisinopril) .Marland Kitchen... 2 tablets by mouth daily for blood pressure    Hydrochlorothiazide 25 Mg Tabs (Hydrochlorothiazide) .Marland Kitchen... 1 tablet by mouth daily for blood pressure    Norvasc 10 Mg Tabs (Amlodipine besylate) .Marland Kitchen... 1 tablet by mouth daily for blood pressure    Metoprolol Succinate 25 Mg Xr24h-tab (Metoprolol succinate) .Marland Kitchen... Take one tablet once daily  Orders: [facility]- Est Level  5 (13244)  Problem # 6:  Screening Cervical Cancer (ICD-V76.2) Pt did not want PAP this visit.  Will do at next visit.  Problem # 7:  Preventive Health Care (ICD-V70.0) Assessment: Comment Only Would benefit from phamacy appt as taking many meds.  Complete Medication List: 1)  Lisinopril 40 Mg Tabs (Lisinopril) .... 2 tablets by mouth daily for blood pressure 2)  Hydrochlorothiazide 25 Mg Tabs (Hydrochlorothiazide) .Marland Kitchen.. 1 tablet by mouth daily for blood pressure 3)  Norvasc 10 Mg Tabs (Amlodipine besylate) .Marland Kitchen.. 1 tablet by mouth daily  for blood pressure 4)  Simvastatin 20 Mg Tabs (Simvastatin) .Marland Kitchen.. 1 tablet by mouth daily for cholesterol 5)  Celexa 40 Mg Tabs (Citalopram hydrobromide) .... One tab by mouth daily 6)  Celebrex 200 Mg Caps (Celecoxib) .... One tab by mouth daily. 7)  Metformin Hcl 1000 Mg Tabs (Metformin hcl) .... 1/2  tab by mouth two times a day 8)  Warfarin Sodium 5 Mg Tabs (Warfarin sodium) .... As directed 9)  Metoprolol Succinate 25 Mg Xr24h-tab (Metoprolol succinate) .... Take one tablet once daily 10)  Prodigy Blood Glucose Monitor W/device Kit (Blood glucose monitoring suppl) .... Test blood glucose up to twice daily. dispense one month supply of strips. 11)  Prodigy Lancets 21g Misc (Lancets) .... Dispense one box.   quantity for one month supply of twice daily checking. 12)  Prodigy Blood Glucose Test Strp (Glucose  blood) .... Dispense quantity of strips sufficient for twice daily checking.  1 box 13)  Nystatin 100000 Unit/gm Oint (Nystatin) .... Apply to affected area two times a day to three times a day 14)  Glipizide 2.5 Mg Xr24h-tab (Glipizide) .... One tab by mouth with breakfast 15)  Prilosec 20 Mg Cpdr (Omeprazole) .... One tab by mouth daily 16)  Tour manager  .... Use in shower to prevent falls  Other Orders: A1C-[facility] (84696) TD Toxoids IM 7 YR + (29528) Admin 1st Vaccine (41324)  Patient Instructions: 1)  Your A1c was excellent today, 2)  GET YOUR MAMMOGRAM 3)  Call Dr. Ashley Royalty office to set up diabetic eye exam 4)  Come back to see me at my NEXT slot for a PAP 5)  Will refill your simvastatin, glipizide, warfarin, metformin. 6)  -Dr. Karie Schwalbe. Prescriptions: GLIPIZIDE 2.5 MG XR24H-TAB (GLIPIZIDE) one tab by mouth with breakfast  #90 x 11   Entered and Authorized by:   Rodney Langton MD   Signed by:   Rodney Langton MD on 11/05/2009   Method used:   Faxed to ...       [facility] [employer] (mail-order)       [address] [place], [place]  [address]       Ph:  [phone]       Fax: [phone]   RxID:   [id number] NYSTATIN 100000 UNIT/GM OINT (NYSTATIN) Apply to affected area two times a day to three times a day  #1 large tube x 1   Entered and Authorized by:   Rodney Langton MD   Signed by:   Rodney Langton MD on 11/05/2009   Method used:   Faxed to ...       [facility] [employer] (mail-order)       [address] [place], [place]  [address]       Ph: [phone]       Fax: [phone]   RxID:   [id number] PRODIGY BLOOD GLUCOSE TEST  STRP (GLUCOSE BLOOD) dispense quantity of strips sufficient for twice daily checking.  1 box  #1 box x 11   Entered and Authorized by:   Rodney Langton MD   Signed by:   Rodney Langton MD on 11/05/2009   Method used:   Faxed to ...       [facility] [employer] (mail[facility]order)       [address] [place], [place]  [address]       Ph: [phone]       Fax: [phone]   RxID:   [id number] WARFARIN SODIUM 5 MG TABS (WARFARIN SODIUM) as directed  #90 x 11   Entered and Authorized by:   Rodney Langton MD   Signed by:   Rodney Langton MD on 11/05/2009   Method used:   Faxed to ...       [facility] [employer] (mail[facility]order)       [address] [place], [place]  [address]       Ph: [phone]       Fax: [phone]   RxID:   [id number] METFORMIN HCL 1000 MG TABS (METFORMIN HCL) 1/2  tab by mouth two times a day  #45 x 6   Entered and Authorized by:   Rodney Langton MD   Signed by:   Rodney Langton MD on 11/05/2009   Method  used:   Faxed to ...       [facility] [employer] (mail[facility])       [address] [place], [place]  [address]       Ph: [phone]       Fax: [phone]   RxID:   [id number] SIMVASTATIN 20 MG TABS (SIMVASTATIN) 1 tablet by mouth daily for cholesterol  #90 x 6   Entered and Authorized by:   Rodney Langton MD   Signed by:   Rodney Langton MD on 11/05/2009   Method used:   Faxed to ...       [facility] [employer] (mail[facility])       [address] [place], [place]  [address]       Ph: [phone]       Fax: [phone]   RxID:   [id number] SHOWER BENCH Use in shower to prevent falls  #1 x 0   Entered and Authorized by:   Rodney Langton MD   Signed by:   Rodney Langton MD on 11/05/2009   Method used:   Faxed to ...       [facility] [employer] (mail-order)       [address] [place], [place]  [address]       Ph: [phone]       Fax: [phone]   RxID:   [id number]   Laboratory Results   Blood Tests   Date/Time Received: November 05, 2009 4:02 PM  Date/Time Reported: November 05, 2009 4:29 PM   HGBA1C: 6.3%   (Normal Range: Non-Diabetic - 3-6%   Control Diabetic - 6-8%)  INR: 3.2   (Normal Range: 0.88-1.12   Therap INR: 2.0-3.5) Comments: ...............test performed by......Marland KitchenBonnie A. [place], MLS (ASCP)cm       ANTICOAGULATION RECORD PREVIOUS REGIMEN & LAB RESULTS Anticoagulation Diagnosis:  Atrial fibrillation on  06/07/2009 Previous INR Goal Range:  2-3 on  06/07/2009 Previous INR:  2.5 on  10/25/2009 Previous Coumadin Dose(mg):  5MG  Tablets on  06/07/2009 Previous Regimen:  continue 5mg  Tues & Fri; 7.5mg  other days on  10/25/2009 Previous Coagulation Comments:  Pt to eat greens 2x week. on  10/25/2009  NEW REGIMEN & LAB RESULTS Current INR: 3.2 Regimen: continue:  5 mg - Tues & Fri;   7.5 mg - other days  Provider: Benjamin Stain Repeat testing in: 2 weeks  11-19-09 Other Comments: ...............test performed by......Marland KitchenBonnie A. [place], MLS (ASCP)cm   Dose has been reviewed with patient or caretaker during this visit. Reviewed by: [name] (ASCP)cm  Anticoagulation Visit Questionnaire Coumadin dose missed/changed:  No Abnormal Bleeding Symptoms:  No  Any diet changes including alcohol intake, vegetables or greens since the last visit:  Yes      Diet Comments:missed eating broccoli on Thurs of last 2 weeks,  plans to resume  eating 2 times/week;  therefore, will continue warfarin dosing as before Any illnesses or hospitalizations since the last visit:  No Any signs of clotting since the last visit (including chest discomfort, dizziness, shortness of breath, arm tingling, slurred speech, swelling or redness in leg):  No  MEDICATIONS LISINOPRIL 40 MG TABS (LISINOPRIL) 2 tablets by mouth daily for blood pressure HYDROCHLOROTHIAZIDE 25 MG TABS (HYDROCHLOROTHIAZIDE) 1 tablet by mouth daily for blood pressure NORVASC 10 MG TABS (AMLODIPINE BESYLATE) 1 tablet by mouth daily for blood pressure SIMVASTATIN 20 MG TABS (SIMVASTATIN) 1 tablet by  mouth daily for cholesterol CELEXA 40 MG TABS (CITALOPRAM HYDROBROMIDE) One tab by mouth daily CELEBREX 200 MG CAPS (CELECOXIB) One tab by mouth daily. METFORMIN HCL 1000 MG TABS (METFORMIN HCL) 1/2  tab by mouth two times a day WARFARIN SODIUM 5 MG TABS (WARFARIN SODIUM) as directed METOPROLOL SUCCINATE 25 MG XR24H-TAB (METOPROLOL SUCCINATE) Take one tablet once daily PRODIGY BLOOD GLUCOSE MONITOR W/DEVICE KIT (BLOOD GLUCOSE MONITORING SUPPL) Test blood glucose up to twice daily. dispense one month supply of strips. PRODIGY LANCETS 21G  MISC (LANCETS) Dispense one box.   Quantity for one month supply of twice daily checking. PRODIGY BLOOD GLUCOSE TEST  STRP (GLUCOSE BLOOD) dispense quantity of strips sufficient for twice daily checking.  1 box NYSTATIN 100000 UNIT/GM OINT (NYSTATIN) Apply to affected area two times a day to three times a day GLIPIZIDE 2.5 MG XR24H-TAB (GLIPIZIDE) one tab by mouth with breakfast PRILOSEC 20 MG CPDR (OMEPRAZOLE) One tab by mouth daily * SHOWER BENCH Use in shower to prevent falls   TD Result Date:  11/05/2009 TD Result:  given TD Next Due:  10 yr Last Diabetes Foot Check:  yes (06/12/2009 10:31:38 AM) Diabetic Foot Exam Date:  11/05/2009 Diabetes Foot Check Result:  abnormal Diabetes Foot Check Next Due:  1 yr Last HGBA1C:  8.2 (06/02/2009  10:31:38 AM) HGBA1C Result Date:  11/05/2009 HGBA1C Next Due:  3 mo Foot Care Education Date:  11/05/2009 Foot Care Education:  completed Foot Care Education Due:  1 yr Self-Mgt EDU Date:  11/05/2009 Self-Mgt EDU:  completed Self-Mgt EDU Next Due:  1 yr    Immunizations Administered:  Tetanus Vaccine:    Vaccine Type: Td    Site: left deltoid    Mfr: San[employer]i Pasteur    Dose: 0.5 ml    Route: IM    Given by: Loralee Pacas CMA    Exp. Date: 06/19/2011    Lot #: [id number]    VIS given: 06/22/07 version given November 05, 2009.     Diabetic Foot Exam Foot Inspection Is there a history of a foot ulcer?              No Is there a foot ulcer now?              No Can the patient see the bottom of their feet?          No Are the shoes appropriate in style and fit?          No Is there swelling or an abnormal foot shape?          No Are the toenails long?                Yes Are the toenails thick?                Yes Are the toenails ingrown?              No Is there heavy callous build-up?              No Is there pain in the calf muscle (Intermittent claudication) when walking?    NoIs there a claw toe deformity?              No Is there elevated skin temperature?            No Is there limited ankle dorsiflexion?            No Is there foot or ankle muscle weakness?  No  Diabetic Foot Care Education Pulse Check          Right Foot          Left Foot Posterior Tibial:        normal            normal Dorsalis Pedis:        normal            normal  High Risk Feet? Yes Set Next Diabetic Foot Exam here: 11/06/2010   10-g (5.07) Semmes-Weinstein Monofilament Test Performed by: Rodney Langton MD          Right Foot          Left Foot Visual Inspection               Test Control      abnormal         abnormal Site 1         abnormal         abnormal Site 2         abnormal         abnormal Site 3         abnormal         abnormal Site 4         abnormal          abnormal Site 5         abnormal         abnormal Site 6         abnormal         abnormal Site 7         abnormal         abnormal Site 8         abnormal         abnormal Site 9         abnormal         abnormal Site 10         normal         normal  Impression      abnormal         abnormal                Nursing Instructions: Diabetic foot exam today

## 2010-09-05 NOTE — Letter (Signed)
Summary: Generic Letter  [facility]  [address]   [place], [place] [address]   Phone: [phone]  Fax: [phone]    10/18/2009  [name] [address] [place], [place]  [address]  Dear Ms. Hyer,  You are overdue for some preventive medical tests.  Please make an appt to see me as soon as is convenient for you.      Sincerely,   Rodney Langton MD

## 2010-09-05 NOTE — Assessment & Plan Note (Signed)
Summary: F/U ABNORMAL LAB LEVELS/[facility]  Vital Signs:  Patient profile:   55 year old female Menstrual status:  postmenopausal Height:      62.5 inches Weight:      281.9 pounds BMI:     50.92 Temp:     98.2 degrees F Pulse rate:   79 / minute BP sitting:   157 / 85  Vitals Entered By: Golden Circle RN (February 19, 2010 8:30 AM)  Primary Care Provider:  Rodney Langton MD   History of Present Illness: Supratherapeutic INR: Pt has not really been eating, family member recently shot and killed, she has been having trouble with the stress of the trials.  Minor bleeding from gums when brushes teeth otherwise no severe bleeding, no hematochezia/melena/hematuria.  HTN:  Elevated, pt has been out of HCTZ.  DM2:  Due for a1c.  L hand:  Numbness/burning on thumb, index, middle, and radial half of ring fingers.  No neck pain.  Habits & Providers  Alcohol-Tobacco-Diet     Alcohol drinks/day: 0     Tobacco Status: never     Diet Comments: low salt & low fat     Diet Counseling: to improve diet; diet is suboptimal  Exercise-Depression-Behavior     Have you felt down or hopeless? yes     Have you felt little pleasure in things? no     STD Risk: never     Drug Use: never     Seat Belt Use: never     Seat Belt Counseling: councelled     Sun Exposure: rarely  Current Medications (verified): 1)  Lisinopril 40 Mg Tabs (Lisinopril) .... 2 Tablets By Mouth Daily For Blood Pressure 2)  Hydrochlorothiazide 25 Mg Tabs (Hydrochlorothiazide) .Marland Kitchen.. 1 Tablet By Mouth Daily For Blood Pressure 3)  Norvasc 10 Mg Tabs (Amlodipine Besylate) .Marland Kitchen.. 1 Tablet By Mouth Daily For Blood Pressure 4)  Simvastatin 20 Mg Tabs (Simvastatin) .Marland Kitchen.. 1 Tablet By Mouth Daily For Cholesterol 5)  Celexa 40 Mg Tabs (Citalopram Hydrobromide) .... One Tab By Mouth Daily 6)  Celebrex 200 Mg Caps (Celecoxib) .... One Tab By Mouth Daily. 7)  Metformin Hcl 1000 Mg Tabs (Metformin Hcl) .... 1/2  Tab By  Mouth Two Times A Day 8)  Warfarin Sodium 5 Mg Tabs (Warfarin Sodium) .... (One Tab) 5mg  Tues & Fri; 7.5mg  (One and A Half Tabs) Other Days 9)  Metoprolol Succinate 25 Mg Xr24h-Tab (Metoprolol Succinate) .... Take One Tablet Once Daily 10)  Prodigy Blood Glucose Monitor W/device Kit (Blood Glucose Monitoring Suppl) .... Test Blood Glucose Up To Twice Daily. Dispense One Month Supply of Strips. 11)  Prodigy Lancets 21g  Misc (Lancets) .... Dispense One Box.   Quantity For One Month Supply of Twice Daily Checking. 12)  Prodigy Blood Glucose Test  Strp (Glucose Blood) .... Dispense Quantity of Strips Sufficient For Twice Daily Checking.  1 Box 13)  Nystatin 100000 Unit/gm Powd (Nystatin) .... Apply Powder Under Breasts Three Times A Day. 14)  Glipizide 2.5 Mg Xr24h-Tab (Glipizide) .... One Tab By Mouth With Breakfast 15)  Prilosec 20 Mg Cpdr (Omeprazole) .... One Tab By Mouth Daily 16)  Shower Bench .... Use in Shower To Prevent Falls  Allergies (verified): 1)  ! 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  Teeth in good repair. Lungs:  Normal respiratory effort, chest expands symmetrically. Lungs are clear to auscultation, no crackles or wheezes. Heart:  Normal rate and regular rhythm. S1 and S2 normal without gallop, murmur, click, rub or other extra sounds. Abdomen:  Bowel sounds positive,abdomen soft and non-tender without masses, organomegaly or hernias noted. Msk:  Wrist:L Inspection normal with no visible erythema or swelling. ROM smooth and normal with good flexion and extension and ulnar/radial deviation that is symmetrical with opposite wrist. Palpation is normal over  metacarpals, navicular, lunate, and TFCC; tendons without tenderness/ swelling Strength 5/5 in all directions without pain. Negative Finkelstein, tinel's and phalens.  Hypoesthesia in median distribution.  Maybe some mild thenar atrophy.  Weak thumb abduction compared to right side.   Impression & Recommendations:  Problem # 1:  CARPAL TUNNEL SYNDROME, LEFT (ICD-354.0) Assessment New Recommended injection when INR normalized.  Pt will think about it.  Wrist extension brace.  Hx CTS release on right side in past.  Orders: [facility]- Est  Level 4 (82956)  Problem # 2:  DIABETES MELLITUS, TYPE II (ICD-250.00) Assessment: Unchanged A1c today.  Her updated medication list for this problem includes:    Lisinopril 40 Mg Tabs (Lisinopril) .Marland Kitchen... 2 tablets by mouth daily for blood pressure    Metformin Hcl 1000 Mg Tabs (Metformin hcl) .Marland Kitchen... 1/2  tab by mouth two times a day    Glipizide 2.5 Mg Xr24h-tab (Glipizide) ..... One tab by mouth with breakfast  Orders: A1C-[facility] (21308) [facility]- Est  Level 4 (65784)  Problem # 3:  COUMADIN THERAPY (ICD-V58.61) Assessment: Deteriorated Holding coumadin x 2 d now, recommended eating greens, checking INR today, if not improved will give vit k.  Restart coumadin when INR therapeutic and will start at lower dose of 5mg  daily.  Orders: INR/PT-[facility] (69629) [facility]- Est  Level 4 (52841)  Problem # 4:  DEPRESSION (ICD-311) Assessment: Deteriorated Worse with current psychosocial stressors.  Advised her that she can come talk to me about this at anytime.  No SI/HI.  Good judgement.  she knows she will get better.  Cont Celexa.  Her updated medication list for this problem includes:    Celexa 40 Mg Tabs (Citalopram hydrobromide) ..... One tab by mouth daily  Complete Medication List: 1)  Lisinopril 40 Mg Tabs (Lisinopril) .... 2 tablets by mouth daily for blood pressure 2)  Hydrochlorothiazide 25 Mg Tabs (Hydrochlorothiazide) .Marland Kitchen.. 1 tablet by mouth daily for blood  pressure 3)  Norvasc 10 Mg Tabs (Amlodipine besylate) .Marland Kitchen.. 1 tablet by mouth daily for blood pressure 4)  Simvastatin 20 Mg Tabs (Simvastatin) .Marland Kitchen.. 1 tablet by mouth daily for cholesterol 5)  Celexa 40 Mg Tabs (Citalopram hydrobromide) .... One tab by mouth daily 6)  Celebrex 200 Mg Caps (Celecoxib) .... One tab by mouth daily. 7)  Metformin Hcl 1000 Mg Tabs (Metformin hcl) .... 1/2  tab by mouth two times a day 8)  Warfarin Sodium 5 Mg Tabs (Warfarin sodium) .... (one tab) 5mg  tues & fri; 7.5mg  (one and a half tabs) other days 9)  Metoprolol Succinate 25 Mg Xr24h-tab (Metoprolol succinate) .... Take one tablet once daily 10)  Prodigy Blood Glucose Monitor W/device Kit (Blood glucose monitoring suppl) .... Test blood glucose up to twice daily. dispense one month supply of strips. 11)  Prodigy Lancets 21g Misc (Lancets) .... Dispense one box.   quantity for  one month supply of twice daily checking. 12)  Prodigy Blood Glucose Test Strp (Glucose blood) .... Dispense quantity of strips sufficient for twice daily checking.  1 box 13)  Nystatin 100000 Unit/gm Powd (Nystatin) .... Apply powder under breasts three times a day. 14)  Glipizide 2.5 Mg Xr24h-tab (Glipizide) .... One tab by mouth with breakfast 15)  Prilosec 20 Mg Cpdr (Omeprazole) .... One tab by mouth daily 16)  Tour manager  .... Use in shower to prevent falls  Patient Instructions: 1)  Great to see you, 2)  EAT MORE FOOD/GREENS!!! 3)  Get a wrist extension brace for your left hand. 4)  Come back to see me in a week, HOLD OFF ON COUMADIN UNTIL I SAY OK. 5)  -Dr. Karie Schwalbe. Prescriptions: HYDROCHLOROTHIAZIDE 25 MG TABS (HYDROCHLOROTHIAZIDE) 1 tablet by mouth daily for blood pressure  #90 x 3   Entered and Authorized by:   Rodney Langton MD   Signed by:   Rodney Langton MD on 02/19/2010   Method used:   Faxed to ...       [facility] [employer] [facility] (mail-order)       [address] [place], [place]  [address]       Ph: [phone]        Fax: [phone]   RxID:   [id number]   Appended Document: INR 5.6   &   A1c 6.7 %     Lab Visit  Laboratory Results   Blood Tests   Date/Time Received: February 19, 2010 9:07 AM  Date/Time Reported: February 19, 2010 2:32 PM   HGBA1C: 6.7%   (Normal Range: Non-Diabetic - 3-6%   Control Diabetic - 6-8%)  INR: 5.6   (Normal Range: 0.88-1.12   Therap INR: 2.0-3.5) Comments: ...............test performed by......Marland KitchenBonnie A. [place], MLS (ASCP)cm    Orders Today:     ANTICOAGULATION RECORD PREVIOUS REGIMEN & LAB RESULTS Anticoagulation Diagnosis:  Atrial fibrillation on  06/07/2009 Previous INR Goal Range:  2-3 on  06/07/2009 Previous INR:  5.45 on  02/18/2010 Previous Coumadin Dose(mg):  5MG  Tablets on  06/07/2009 Previous Regimen:  none today on  02/18/2010 Previous Coagulation Comments:  Pt to eat greens 2x week. on  10/25/2009  NEW REGIMEN & LAB RESULTS Current INR: 5.6 Regimen: none x 1 week Coagulation Comments: did not run confirmation INR @ [facility] today because INR has decreased overnight  Provider: Benjamin Stain Repeat testing in: 1 week  02-27-10 Other Comments: ...............test performed by......Marland KitchenBonnie A. [place], MLS (ASCP)cm   Dose has been reviewed with patient or caretaker during this visit. Reviewed by: Dr. Laurence Compton  Anticoagulation Visit Questionnaire Coumadin dose missed/changed:  Yes Coumadin Dose Comments:  as directed yesterday Abnormal Bleeding Symptoms:  Yes    Bruising or bleeding from nose or gums, in urine or stool since the last visit:  gums bleeding minimally Any diet changes including alcohol intake, vegetables or greens since the last visit:  Yes      Diet Comments:not eating very well due to stress Any illnesses or hospitalizations since the last visit:  No Any signs of clotting since the last visit (including chest discomfort, dizziness, shortness of breath, arm tingling, slurred speech, swelling or redness in leg):   No  MEDICATIONS LISINOPRIL 40 MG TABS (LISINOPRIL) 2 tablets by mouth daily for blood pressure HYDROCHLOROTHIAZIDE 25 MG TABS (HYDROCHLOROTHIAZIDE) 1 tablet by mouth daily for blood pressure NORVASC 10 MG TABS (AMLODIPINE BESYLATE) 1 tablet by mouth daily for blood pressure  SIMVASTATIN 20 MG TABS (SIMVASTATIN) 1 tablet by mouth daily for cholesterol CELEXA 40 MG TABS (CITALOPRAM HYDROBROMIDE) One tab by mouth daily CELEBREX 200 MG CAPS (CELECOXIB) One tab by mouth daily. METFORMIN HCL 1000 MG TABS (METFORMIN HCL) 1/2  tab by mouth two times a day WARFARIN SODIUM 5 MG TABS (WARFARIN SODIUM) (One tab) 5mg  Tues & Fri; 7.5mg  (one and a half tabs) other days METOPROLOL SUCCINATE 25 MG XR24H-TAB (METOPROLOL SUCCINATE) Take one tablet once daily PRODIGY BLOOD GLUCOSE MONITOR W/DEVICE KIT (BLOOD GLUCOSE MONITORING SUPPL) Test blood glucose up to twice daily. dispense one month supply of strips. PRODIGY LANCETS 21G  MISC (LANCETS) Dispense one box.   Quantity for one month supply of twice daily checking. PRODIGY BLOOD GLUCOSE TEST  STRP (GLUCOSE BLOOD) dispense quantity of strips sufficient for twice daily checking.  1 box NYSTATIN 100000 UNIT/GM POWD (NYSTATIN) Apply powder under breasts three times a day. GLIPIZIDE 2.5 MG XR24H-TAB (GLIPIZIDE) one tab by mouth with breakfast PRILOSEC 20 MG CPDR (OMEPRAZOLE) One tab by mouth daily * SHOWER BENCH Use in shower to prevent falls   Appended Document: lab update 02-25-10 pt called to let us know that she has been drinking a lot of cranberry juice.  Advised pt that this will cause and increase in INR and she should stop drinking the juice.  Pt understands and returns Wed 02-27-10 for next INR check. Bonnie [place], MLS (ASCP)cm  Thanks! Rodney Langton MD  February 25, 2010 12:20 PM      Lab Visit  Orders Today:

## 2010-09-05 NOTE — Miscellaneous (Signed)
Summary: change meds  Medications Added PRILOSEC 20 MG CPDR (OMEPRAZOLE) One tab by mouth daily       Clinical Lists Changes [facility] called [phone]. they want the order for prilosec OTC to be changed to just prilosec so insurance will cover it. also needs refill on metroprolol...Marland KitchenMarland KitchenGolden Circle RN  October 10, 2009 11:53 AM    Medications: Changed medication from [facility] OTC 20 MG TBEC (OMEPRAZOLE MAGNESIUM) One tab Po qHS to PRILOSEC 20 MG CPDR (OMEPRAZOLE) One tab by mouth daily - Signed Rx of METOPROLOL SUCCINATE 25 MG XR24H-TAB (METOPROLOL SUCCINATE) Take one tablet once daily;  #10 x 0;  Signed;  Entered by: Golden Circle RN;  Authorized by: Rodney Langton MD;  Method used: Electronically to [facility] [phone]*, [address] [place], [place]  [address], Ph: [phone], Fax: [phone] Rx of METOPROLOL SUCCINATE 25 MG XR24H-TAB (METOPROLOL SUCCINATE) Take one tablet once daily;  #90 x 2;  Signed;  Entered by: Rodney Langton MD;  Authorized by: Rodney Langton MD;  Method used: Faxed to [facility], [address] [place], [place]  [address], Ph: [phone], Fax: [phone] Rx of PRILOSEC 20 MG CPDR (OMEPRAZOLE) One tab by mouth daily;  #90 x 6;  Signed;  Entered by: Rodney Langton MD;  Authorized by: Rodney Langton MD;  Method used: Faxed to [facility], [address], [place], [place]  [address], Ph: [phone], Fax: [phone]    Prescriptions: PRILOSEC 20 MG CPDR (OMEPRAZOLE) One tab by mouth daily  #90 x 6   Entered and Authorized by:   Rodney Langton MD   Signed by:   Rodney Langton MD on 10/14/2009   Method used:   Faxed to ...       [facility] (mail-order)       [address] [place], [place]  [address]       Ph: [phone]       Fax: [phone]   RxID:   [id number] METOPROLOL SUCCINATE 25 MG XR24H-TAB (METOPROLOL SUCCINATE) Take one tablet once daily  #90 x 2   Entered and  Authorized by:   Rodney Langton MD   Signed by:   Rodney Langton MD on 10/14/2009   Method used:   Faxed to ...       [facility] (mail-order)       [address] [place], [place]  [address]       Ph: [phone]       Fax: [phone]   RxID:   [id number] METOPROLOL SUCCINATE 25 MG XR24H-TAB (METOPROLOL SUCCINATE) Take one tablet once daily  #10 x 0   Entered by:   Golden Circle RN   Authorized by:   Rodney Langton MD   Signed by:   Golden Circle RN on 10/11/2009   Method used:   Electronically to        [facility] [phone]* (retail)       [address]       [place], [place]  [address]       Ph: [phone]       Fax: [phone]   RxID:   [id number]  she walked in for labs & came to discuss her bp meds. [facility] but they claim they never got them. I gave it to the pharmacist verbally.  this as well as the ppi. states she is out of  both.. wanted a small dose of BP med to [facility]. c/o ha . bp L arm using thigh cuff-144/92. advised her to go get Z& take the med from [facility], take tylenol & rest. she has been out f her meds for over a week. Marland KitchenGolden Circle RN  October 11, 2009 2:20 PM   Done and faxed. Rodney Langton MD  October 14, 2009 2:27 PM

## 2010-09-05 NOTE — Miscellaneous (Signed)
Summary: [facility]    Clinical Lists Changes  called to get auth for  DM shoes -gave NPI # De Nurse  August 14, 2010 9:39 AM

## 2010-09-05 NOTE — Assessment & Plan Note (Signed)
 Summary: hfu,df   Vital Signs:  Patient profile:   55 year old female Menstrual status:  postmenopausal Height:      63.1 inches Weight:      280.1 pounds BMI:     49.64 Temp:     98.6 degrees F oral Pulse rate:   76 / minute BP sitting:   139 / 85  (right arm)  Vitals Entered By: Olam Keeling (June 14, 2009 1:40 PM) CC: Hospital F/U Is Patient Diabetic? Yes Did you bring your meter with you today? No Pain Assessment Patient in pain? no        Primary Care Provider:  [name] MD  CC:  Hospital F/U.  History of Present Illness: 74F here for HFU. Admitted for CP, ruled out for MI, symptoms have characteristics of vagal attack with random onset at rest, nausea, presyncope, no change or CP with exertion.  Rate ok in hospital.  DM2: new Dx, on metformin , did not get CBG meter yet, will pick up from [employer].  Needs Diabetic Eye appt.  HTN:  Slightly high today.  Absolute lymphocytosis in hospital:  Recommended recheck as outpt.  Depression:  Still feeling depressed, cries often, feels that she is a burden to her family members.  No AE from celexa  10.  Would like to increase.  Nails:  Dystrophic, finishing course of Lamisil  oral.    Afib: Rate regular today. On coumadin .  Habits & Providers  Alcohol-Tobacco-Diet     Tobacco Status: never  Allergies: 1)  ! Darvocet-N 100 (Propoxyphene N-Apap)  Past History:  Past Surgical History: Last updated: 07/31/2008 T&A at age 52 D&C for Hydatiform Mole Pregnancy- 1976 Carpal tunnel release right knee meniscus repair - 3/09 by Dr. Liam  Family History: Last updated: 07/31/2008 has 15 brothers and sister.  Some have HTN.  One sister died of lung cancer. mother- died of congestive heart failure at age 25. father- still alive, has diabetes & HTN  Social History: Last updated: 08/29/2008 High school education.  Has car & used to drive until she had a stroke.  Worked at [employer] at [employer].  Separated from husband for 26 years.  Has 3 children in 56s- 30s and raising 80 yo grandson.   Lives with grandson and youngest daughter.  Has boyfriend who she has ben dating for 8 years.  Boyfriend has lung cancer.  Denies alcohol, tobacco, and drug use.  Past Medical History: Hospitalized 05/2006 for hypertensive urgency and face numbness thought secondary to small vessel disease.     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    - MRA of neck shows moderate intracranial atheroscleric changes    - carotid dopplers: no R ICA stenosis, L ICA has 40-60% stenosis distally, vertebral artery flow is antegrade    - 2 D echo no clots, EF 55%  Stress test negative (10/2008) Dr. Levern.  Review of Systems       See HPI  Physical Exam  General:  Well-developed,well-nourished,in no acute distress; alert,appropriate and cooperative throughout examination Lungs:  Normal respiratory effort, chest expands symmetrically. Lungs are clear to auscultation, no crackles or wheezes. Heart:  Normal rate and regular rhythm. S1 and S2  normal without gallop, murmur, click, rub or other extra sounds. Abdomen:  Bowel sounds positive,abdomen soft and non-tender without masses, organomegaly or hernias noted. Extremities:  Nails thickened, yellowish, clipped today. Additional Exam:  CHADS2: 4 (needs coumadin ) EKG: NSR   Impression & Recommendations:  Problem # 1:  DIABETES MELLITUS, TYPE II (ICD-250.00) Assessment Unchanged Cont metformin , lisinopril , check CBGs, RTC one month with record of sugars, will adjust oral hypoglycemics as needed. Ophtho referral.  Her updated medication list for this problem includes:     Lisinopril  40 Mg Tabs (Lisinopril ) .SABRA... 2 tablets by mouth daily for blood pressure    Metformin  Hcl 500 Mg Tabs (Metformin  hcl) .SABRA... 1 pill in the moring and two at night  Orders: [facility]- Est  Level 4 (00785) Ophthalmology Referral (Ophthalmology)  Problem # 2:  COUMADIN  THERAPY (ICD-V58.61) Assessment: Deteriorated To lab today. Decreased coumadin  to 5 once daily.  Orders: INR/PT-[facility] (14389) [facility]- Est  Level 4 (00785)  Problem # 3:  ATRIAL FIBRILLATION (ICD-427.31) Assessment: New CHADS2 score 4, needs coumadin  even though in NSR today.  Her updated medication list for this problem includes:    Norvasc  10 Mg Tabs (Amlodipine  besylate) .SABRA... 1 tablet by mouth daily for blood pressure    Warfarin Sodium  5 Mg Tabs (Warfarin sodium ) .SABRA... As directed    Metoprolol  Succinate 25 Mg Xr24h-tab (Metoprolol  succinate) .SABRA... Take one tablet once daily  Orders: 12 Lead EKG (12 Lead EKG) [facility]- Est  Level 4 (99214)  Problem # 4:  DERMATOPHYTOSIS OF FOOT (ICD-110.4) Assessment: Unchanged Nails clipped today, continue Lamisil .  Her updated medication list for this problem includes:    Terbinafine  Hcl 250 Mg Tabs (Terbinafine  hcl) ..... 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   Orders: [facility]- Est  Level 4 (00785)  Problem # 6:  HYPERTENSION (ICD-401.9) Assessment: Improved Removed clonidine , increased metoprolol  to full tab daily.  The following medications were removed from the medication list:    Clonidine  Hcl 0.2 Mg Tabs (Clonidine  hcl) .SABRA... 1 tablet by mouth two times a day for blood pressure Her updated medication list for this problem includes:    Lisinopril  40 Mg Tabs (Lisinopril ) .SABRA... 2 tablets by mouth daily for blood pressure    Hydrochlorothiazide  25 Mg Tabs  (Hydrochlorothiazide ) .SABRA... 1 tablet by mouth daily for blood pressure    Norvasc  10 Mg Tabs (Amlodipine  besylate) .SABRA... 1 tablet by mouth daily for blood pressure    Metoprolol  Succinate 25 Mg Xr24h-tab (Metoprolol  succinate) .SABRA... Take one tablet once daily  Orders: [facility]- Est  Level 4 (00785)  Complete Medication List: 1)  Lisinopril  40 Mg Tabs (Lisinopril ) .... 2 tablets by mouth daily for blood pressure 2)  Hydrochlorothiazide  25 Mg Tabs (Hydrochlorothiazide ) .SABRA.. 1 tablet by mouth daily for blood pressure 3)  Norvasc  10 Mg Tabs (Amlodipine  besylate) .SABRA.. 1 tablet by mouth daily for blood pressure 4)  Simvastatin  20 Mg Tabs (Simvastatin ) .SABRA.. 1 tablet by mouth daily for cholesterol 5)  Celexa  20 Mg Tabs (Citalopram  hydrobromide) .... One tab by mouth daily 6)  Celebrex  200 Mg Caps (Celecoxib ) .... One tab by mouth daily. 7)  Terbinafine  Hcl 250 Mg Tabs (Terbinafine  hcl) .... One tab by mouth daily x 12 weeks 8)  Metformin  Hcl 500 Mg Tabs (Metformin  hcl) .SABRA.. 1 pill in  the moring and two at night 9)  Warfarin Sodium  5 Mg Tabs (Warfarin sodium ) .... As directed 10)  Metoprolol  Succinate 25 Mg Xr24h-tab (Metoprolol  succinate) .... Take one tablet once daily 11)  Prodigy Blood Glucose Monitor W/device Kit (Blood glucose monitoring suppl) .... Test blood glucose up to twice daily. dispense one month supply of strips. 12)  Prodigy Lancets 21g Misc (Lancets) .... Dispense one box.   quantity for one month supply of twice daily checking. 13)  Prodigy Blood Glucose Test Strp (Glucose blood) .... Dispense quantity of strips sufficient for twice daily checking.  1 box  Other Orders: CBC w/Diff-[facility] (14974)  Patient Instructions: 1)  Great to see you, 2)  Refilled lisinopril . 3)  EKG 4)  Checked CBC and INR 5)  Clipped toenails 6)  See an eye doctor for your diabetic eye exam. 7)  Come back at my next appt slot to have a PAP and physical. 8)  Then come back in a month and be sure to check your  sugars at least every morning. 9)  -Dr. ONEIDA. Prescriptions: CELEXA  20 MG TABS (CITALOPRAM  HYDROBROMIDE) One tab by mouth daily  #30 x 6   Entered and Authorized by:   [name] MD   Signed by:   [name] MD on 06/14/2009   Method used:   Electronically to        [facility] [phone]* (retail)       [address]       [place], [place]  [address]       Ph: [phone]       Fax: [phone]   RxID:   [id number] LISINOPRIL  40 MG TABS (LISINOPRIL ) 2 tablets by mouth daily for blood pressure  #60 x 12   Entered and Authorized by:   [name] MD   Signed by:   [name] MD on 06/14/2009   Method used:   Electronically to        [facility] [phone]* (retail)       [address]       [place], [place]  [address]       Ph: [phone]       Fax: [phone]   RxID:   [id number]   Last HDL:  45 (11/23/2008 9:12:00 PM) HDL Result Date:  06/03/2009 HDL Result:  40 HDL Next Due:  1 yr Last LDL:  73 (11/23/2008 9:12:00 PM) LDL Result Date:  06/03/2009 LDL Result:  48 LDL Next Due:  1 yr Microalbumin Result Date:  08/15/2008 Microalbumin Result:  normal Microalbumin Next Due:  1 yr    ANTICOAGULATION RECORD PREVIOUS REGIMEN & LAB RESULTS Anticoagulation Diagnosis:  Atrial fibrillation on  06/07/2009 Previous INR Goal Range:  2-3 on  06/07/2009 Previous INR:  3.3 on  06/11/2009 Previous Coumadin  Dose(mg):  5MG  Tablets on  06/07/2009 Previous Regimen:  5MG  Mon & Thur; 7.5mg  other days on  06/11/2009  NEW REGIMEN & LAB RESULTS Current INR: 3.8 Regimen: 5 mg daily  Provider: Sheron Tallman Repeat testing in: 1 week Other Comments: ...............test performed by......SABRABonnie A. Jordan, MLS (ASCP)cm   Dose has been reviewed with patient or caretaker during this visit. Reviewed by: Dr. Petties    Prevention & Chronic Care Immunizations   Influenza vaccine: Not documented    Tetanus booster: Not  documented    Pneumococcal vaccine: Not documented  Colorectal Screening   Hemoccult: normal  (06/04/2005)   Hemoccult due: Not Indicated    Colonoscopy: normal  (  06/04/2005)   Colonoscopy due: 06/05/2015  Other Screening   Pap smear: normal  (08/04/1986)   Pap smear due: 08/05/1987    Mammogram: Not documented   Smoking status: never  (06/14/2009)  Diabetes Mellitus   HgbA1C: 8.2  (06/02/2009)   Hemoglobin A1C due: 09/02/2009    Eye exam: Not documented    Foot exam: yes  (06/12/2009)   Foot exam action/deferral: Do today   High risk foot: Not documented   Foot care education: Not documented   Foot exam due: 06/12/2010    Urine microalbumin/creatinine ratio: Not documented   Urine microalbumin/cr due: 08/15/2009    Diabetes flowsheet reviewed?: Yes   Progress toward A1C goal: Improved  Lipids   Total Cholesterol: 138  (11/23/2008)   LDL: 48  (06/03/2009)   LDL Direct: Not documented   HDL: 40  (06/03/2009)   Triglycerides: 101  (11/23/2008)    SGOT (AST): 27  (03/13/2009)   SGPT (ALT): 37  (03/13/2009)   Alkaline phosphatase: 83  (03/13/2009)   Total bilirubin: 0.3  (03/13/2009)    Lipid flowsheet reviewed?: Yes   Progress toward LDL goal: Unchanged  Hypertension   Last Blood Pressure: 139 / 85  (06/14/2009)   Serum creatinine: 1.04  (03/13/2009)   Serum potassium 4.1  (03/13/2009)    Hypertension flowsheet reviewed?: Yes   Progress toward BP goal: Improved  Self-Management Support :   Personal Goals (by the next clinic visit) :     Personal A1C goal: 8  (06/12/2009)     Personal blood pressure goal: 130/80  (06/12/2009)     Personal LDL goal: 100  (06/12/2009)    Diabetes self-management support: Not documented    Hypertension self-management support: Not documented    Hypertension self-management support not done because: Good outcomes  (06/12/2009)    Lipid self-management support: Not documented     Lipid self-management support not done  because: Good outcomes  (06/12/2009)

## 2010-09-05 NOTE — Progress Notes (Signed)
 Summary: triage  Medications Added ASPIRIN  EC 325 MG  TBEC (ASPIRIN )  BAYER LOW STRENGTH 81 MG TBEC (ASPIRIN ) 1 tablet by mouth daily BAYER LOW STRENGTH 81 MG TBEC (ASPIRIN ) 1 tablet by mouth daily HYDROCHLOROTHIAZIDE  25 MG  TABS (HYDROCHLOROTHIAZIDE )  HYDROCHLOROTHIAZIDE  25 MG  TABS (HYDROCHLOROTHIAZIDE )  SYNTHROID 50 MCG  TABS (LEVOTHYROXINE SODIUM)  SYNTHROID 50 MCG  TABS (LEVOTHYROXINE SODIUM)  * METOPROLOL  100MG  1 by mouth two times a day * METOPROLOL  100MG  1 by mouth two times a day CATAPRES  0.2 MG  TABS (CLONIDINE  HCL) 1 by mouth two times a day CATAPRES  0.2 MG  TABS (CLONIDINE  HCL) 1 by mouth two times a day NORVASC  5 MG  TABS (AMLODIPINE  BESYLATE)  NORVASC  5 MG  TABS (AMLODIPINE  BESYLATE)  MELOXICAM  15 MG TABS (MELOXICAM ) 1 tablet by mouth every morning as needed for pain MELOXICAM  15 MG TABS (MELOXICAM ) 1 tablet by mouth every morning as needed for pain LISINOPRIL -HYDROCHLOROTHIAZIDE  20-25 MG TABS (LISINOPRIL -HYDROCHLOROTHIAZIDE ) 1 tablet by mouth daily for blood pressure LISINOPRIL  40 MG TABS (LISINOPRIL ) 2 tablets by mouth daily for blood pressure LISINOPRIL  40 MG TABS (LISINOPRIL ) 1 tablet by mouth daily for blood pressure CLONIDINE  HCL 0.2 MG TABS (CLONIDINE  HCL) 1 tablet by mouth two times a day for blood pressure HYDROCHLOROTHIAZIDE  25 MG TABS (HYDROCHLOROTHIAZIDE ) 1 tablet by mouth daily for blood pressure NORVASC  10 MG TABS (AMLODIPINE  BESYLATE) 1 tablet by mouth daily for blood pressure PLAVIX  75 MG TABS (CLOPIDOGREL  BISULFATE) 1 tablet by mouth daily to prevent stroke SIMVASTATIN  20 MG TABS (SIMVASTATIN ) 1 tablet by mouth daily for cholesterol CELEXA  10 MG TABS (CITALOPRAM  HYDROBROMIDE) 1 tablet by mouth daily COREG  3.125 MG TABS (CARVEDILOL ) 1 tablet by mouth two times a day for blood pressure NYSTATIN  100000 UNIT/GM POWD (NYSTATIN ) apply to rash two times a day until rash is resolved or up to 10 days. Dispense 1 jar. NYSTATIN  100000 UNIT/GM POWD (NYSTATIN ) apply to  rash two times a day until rash is resolved or up to 10 days. Dispense 1 jar. CELEBREX  200 MG CAPS (CELECOXIB ) One tab by mouth daily. CYCLOBENZAPRINE  HCL 5 MG TABS (CYCLOBENZAPRINE  HCL) One tab by mouth TID ROBAXIN  500 MG TABS (METHOCARBAMOL ) Two tabs by mouth q6h as needed for back spasm TERBINAFINE  HCL 250 MG TABS (TERBINAFINE  HCL) One tab by mouth daily x 12 weeks       Phone Note Call from Patient Call back at Home Phone [phone]   Caller: Patient Summary of Call: thinks she is have a reaction to the Cyclobenzaprine - her face is itching Initial call taken by: Karna Seminole,  April 05, 2009 4:40 PM  Follow-up for Phone Call        states she has had an itchy face since she started the flexeril  last week. told her to stop. advised benadryl -NOT to drive while on it. may use cool cloths to face & use antiitch creme. message to pcp Follow-up by: Ginnie Mau RN,  April 05, 2009 4:43 PM  Additional Follow-up for Phone Call Additional follow up Details #1::        Not a known allergic response to the medication but can switch to robaxin . Additional Follow-up by: [name] MD,  April 05, 2009 9:04 PM    New/Updated Medications: ROBAXIN  500 MG TABS (METHOCARBAMOL ) Two tabs by mouth q6h as needed for back spasm Prescriptions: ROBAXIN  500 MG TABS (METHOCARBAMOL ) Two tabs by mouth q6h as needed for back spasm  #60 x 3   Entered and Authorized  by:   [name] MD   Signed by:   [name] MD on 04/05/2009   Method used:   Electronically to        [facility] [phone]* (retail)       [address]       [place], [place]  [address]       Ph: [phone]       Fax: [phone]   RxID:   [id number]   Appended Document: triage no answer at home phone. no longer works at other number. will wait for her to call back to tell hr med has been changes

## 2010-09-11 NOTE — Assessment & Plan Note (Signed)
Summary: Follow-up visit  HPI  Pt here for fu of MMP.  DM2: Uncontrolled, she takes several oral hypoglycemics but is not entirely compliant. HTN: Had been off all BP medications since last visit. Has been taking them now.  HLD: Taking Zocor.  Depression: Has been out of celexa for a month now. Has crying spells, feels tired and down all the time. Family members present and note that she was [facility] better when taking the celexa. Pt amenable to going back on this medication.  CTS: Injected at last visit, all symptoms resolved.  Review of Systems  Neg except as in HPI.  Objective: Physical Exam Constitutional: She appears well-developed and well-nourished. No distress. Cardiovascular: Normal rate, regular rhythm, normal heart sounds and intact distal pulses. Pulmonary/Chest: Effort normal and breath sounds normal.   Assessment & Plan:      DIABETES MELLITUS, TYPE II - [facility] 09/04/2010 4:49 PM Addendum  We will set up a dedicated office visit to discuss her DM2.  Her A1c is again uncontrolled.  I think that in the past we have tackled too many different problems in each visit for her to absorb.  Ophtho referral to [facility].HYPERTENSION - [facility] 09/04/2010 4:48 PM Signed  Improved but not ideal.  Will increase clonidine to 0.2mg  PO BID.  Increase HCTZ 25 and lisinopril 10 to Lisinopril/HCTZ 20/25mg  Combo  RTC 1 month to reassess.CARPAL TUNNEL SYNDROME, LEFT - [facility] 09/04/2010 4:44 PM Signed  Resolved s/p injection.  May repeat this injection q12 weeks if needed if using 1mg  kenalog or q6 weeks if using 1/2mg  kenalog in each injection.  Need to assess how long the injections help (its been about 5 weeks so far).DEPRESSION - [facility] 09/04/2010 4:42 PM Signed  Deteriorated.  Restarting Celexa as this worked very well in the past, if only she would be compliant with this medication for an extended period of time.  She will RTC to reassess in a  month.[name] 09/04/2010 4:42 PM Signed  Cont zocor.  Last dLDL 107, nearly controlled.  She is not yet due for a repeat dLDL.    Prescriptions: CELEXA 40 MG TABS (CITALOPRAM HYDROBROMIDE) One tab by mouth daily  #90 x 3   Entered and Authorized by:   Rodney Langton MD   Signed by:   Rodney Langton MD on 09/04/2010   Method used:   Faxed to ...       [facility] (mail-order)       [address] [place], [place]  [address]       Ph: [phone]       Fax: [phone]   RxID:   [id number] CLONIDINE HCL 0.2 MG TABS (CLONIDINE HCL) One tab by mouth two times a day  #180 x 0   Entered and Authorized by:   Rodney Langton MD   Signed by:   Rodney Langton MD on 09/04/2010   Method used:   Faxed to ...       [facility] (mail-order)       [address] [place], [place]  [address]       Ph: [phone]       Fax: [phone]   RxID:   [id number] LISINOPRIL-HYDROCHLOROTHIAZIDE 20-25 MG TABS (LISINOPRIL-HYDROCHLOROTHIAZIDE) One tab by mouth daily  #90 x 3   Entered and Authorized by:   Rodney Langton MD   Signed by:   Rodney Langton MD on 09/04/2010  Method used:   Faxed to ...       [facility] (mail-order)       [address] [place], [place]  [address]       Ph: [phone]       Fax: [phone]   RxID:   [id number]    Vital Signs:  Patient profile:   55 year old female Menstrual status:  postmenopausal Height:      62.5 inches Weight:      281.4 pounds BMI:     50.83 Temp:     98.1 degrees F oral BP sitting:   140 / 90  (left arm) Cuff size:   large  Vitals Entered By: Jimmy Footman, CMA (September 04, 2010 2:57 PM)  Is Patient Diabetic? Yes Did you bring your meter with you today? No    History of Present Illness: PCP:  Rodney Langton MD   Patient Instructions: 1)  Great to see you, 2)  Refilled Celexa. 3)  Take 2 lisinopril  daily until you run out then switch to the new Lisinopril/HCTZ combo med.  Also stop the HCTZ 25mg  when you start the combo pill. 4)  Increase Clonidine to 2 tabs two times a day until you run out, then start the new 0.2mg  tabs two times a day. 5)  N[facility] Riggs referral done. 6)  Come back to see me in a month. 7)  -Dr. Karie Schwalbe.    ANTICOAGULATION RECORD PREVIOUS REGIMEN & LAB RESULTS Anticoagulation Diagnosis:  Atrial fibrillation on  06/07/2009 Previous INR Goal Range:  2-3 on  06/07/2009 Previous INR:  2.5 on  07/30/2010 Previous Coumadin Dose(mg):  5MG  Tablets on  06/07/2009 Previous Regimen:  continue same:  2.5 mg - Tues;  5 mg - other days on  07/30/2010 Previous Coagulation Comments:  Pt states she has started taking Naproxen. on  05/17/2010  NEW REGIMEN & LAB RESULTS Current INR: 2.4 Regimen: continue same:  2.5 mg - Tues;  5 mg - other days  Provider: Benjamin Stain Repeat testing in: 4 weeks  10-02-10 Other Comments: ...............test performed by......Marland KitchenBonnie A. [place], MLS (ASCP)cm   Dose has been reviewed with patient or caretaker during this visit. Reviewed by: [name] (ASCP)cm  Anticoagulation Visit Questionnaire Coumadin dose missed/changed:  No Abnormal Bleeding Symptoms:  No  Any diet changes including alcohol intake, vegetables or greens since the last visit:  No Any illnesses or hospitalizations since the last visit:  No Any signs of clotting since the last visit (including chest discomfort, dizziness, shortness of breath, arm tingling, slurred speech, swelling or redness in leg):  No  MEDICATIONS LISINOPRIL-HYDROCHLOROTHIAZIDE 20-25 MG TABS (LISINOPRIL-HYDROCHLOROTHIAZIDE) One tab by mouth daily NORVASC 10 MG TABS (AMLODIPINE BESYLATE) 1 tablet by mouth daily for blood pressure SIMVASTATIN 40 MG TABS (SIMVASTATIN) One tab by mouth daily CELEXA 40 MG TABS (CITALOPRAM HYDROBROMIDE) One tab by mouth daily CELEBREX 200 MG CAPS (CELECOXIB) One tab by mouth  daily. METFORMIN HCL 1000 MG TABS (METFORMIN HCL) One tab PO qAM, 1/2  tab by mouth qPM WARFARIN SODIUM 5 MG TABS (WARFARIN SODIUM) (One tab) 5mg  Tues & Fri; 7.5mg  (one and a half tabs) other days METOPROLOL SUCCINATE 25 MG XR24H-TAB (METOPROLOL SUCCINATE) Take one tablet once daily PRODIGY BLOOD GLUCOSE MONITOR W/DEVICE KIT (BLOOD GLUCOSE MONITORING SUPPL) Test blood glucose up to twice daily. dispense one month supply of strips. PRODIGY LANCETS 21G  MISC (LANCETS) Dispense one box.   Quantity for one month supply  of twice daily checking. PRODIGY BLOOD GLUCOSE TEST  STRP (GLUCOSE BLOOD) dispense quantity of strips sufficient for twice daily checking.  1 box NYSTATIN 100000 UNIT/GM POWD (NYSTATIN) Apply powder under breasts three times a day. GLIPIZIDE 2.5 MG XR24H-TAB (GLIPIZIDE) one tab by mouth with breakfast PRILOSEC 20 MG CPDR (OMEPRAZOLE) One tab by mouth daily * SHOWER BENCH Use in shower to prevent falls CLONIDINE HCL 0.2 MG TABS (CLONIDINE HCL) One tab by mouth two times a day

## 2010-09-14 ENCOUNTER — Encounter: Payer: Self-pay | Admitting: Sports Medicine

## 2010-09-14 DIAGNOSIS — I4891 Unspecified atrial fibrillation: Secondary | ICD-10-CM

## 2010-09-14 DIAGNOSIS — Z7901 Long term (current) use of anticoagulants: Secondary | ICD-10-CM

## 2010-10-02 ENCOUNTER — Other Ambulatory Visit: Payer: Self-pay

## 2010-10-15 ENCOUNTER — Ambulatory Visit (INDEPENDENT_AMBULATORY_CARE_PROVIDER_SITE_OTHER): Payer: Self-pay | Admitting: *Deleted

## 2010-10-15 DIAGNOSIS — I4891 Unspecified atrial fibrillation: Secondary | ICD-10-CM

## 2010-10-15 DIAGNOSIS — Z7901 Long term (current) use of anticoagulants: Secondary | ICD-10-CM

## 2010-10-15 LAB — POCT CARDIAC MARKERS
Myoglobin, poc: 68.1 ng/mL (ref 12–200)
Troponin i, poc: <0.05 ng/mL (ref 0.00–0.09)

## 2010-10-15 LAB — DIFFERENTIAL
Basophils Absolute: 0 10*3/uL (ref 0.0–0.1)
Eosinophils Absolute: 0.1 10*3/uL (ref 0.0–0.7)
Eosinophils Relative: 1 % (ref 0–5)
Lymphocytes Relative: 42 % (ref 12–46)
Lymphs Abs: 3.6 10*3/uL (ref 0.7–4.0)
Monocytes Relative: 6 % (ref 3–12)
Neutrophils Relative %: 50 % (ref 43–77)

## 2010-10-15 LAB — POCT I-STAT, CHEM 8
BUN: 9 mg/dL (ref 6–23)
Calcium, Ion: 1.12 mmol/L (ref 1.12–1.32)
Chloride: 103 mEq/L (ref 96–112)
Glucose, Bld: 309 mg/dL — ABNORMAL HIGH (ref 70–99)

## 2010-10-15 LAB — MAGNESIUM: Magnesium: 2.1 mg/dL (ref 1.5–2.5)

## 2010-10-15 LAB — CBC
MCH: 22.9 pg — ABNORMAL LOW (ref 26.0–34.0)
MCHC: 32.1 g/dL (ref 30.0–36.0)
MCV: 71.3 fL — ABNORMAL LOW (ref 78.0–100.0)
Platelets: 314 10*3/uL (ref 150–400)
RBC: 5.47 MIL/uL — ABNORMAL HIGH (ref 3.87–5.11)

## 2010-10-15 LAB — PROTIME-INR
INR: 2.8 — ABNORMAL HIGH (ref 0.00–1.49)
Prothrombin Time: 29.6 seconds — ABNORMAL HIGH (ref 11.6–15.2)

## 2010-10-15 LAB — POCT INR: INR: 2

## 2010-10-15 LAB — URINALYSIS, ROUTINE W REFLEX MICROSCOPIC
Bilirubin Urine: NEGATIVE
Glucose, UA: >1000 mg/dL — AB
Ketones, ur: NEGATIVE mg/dL
Nitrite: NEGATIVE
Protein, ur: >300 mg/dL — AB

## 2010-10-15 LAB — URINE MICROSCOPIC-ADD ON

## 2010-10-15 LAB — GLUCOSE, CAPILLARY
Glucose-Capillary: 219 mg/dL — ABNORMAL HIGH (ref 70–99)
Glucose-Capillary: 284 mg/dL — ABNORMAL HIGH (ref 70–99)

## 2010-10-15 LAB — APTT: aPTT: 59 seconds — ABNORMAL HIGH (ref 24–37)

## 2010-10-17 LAB — POCT I-STAT, CHEM 8
BUN: 13 mg/dL (ref 6–23)
Creatinine, Ser: 0.9 mg/dL (ref 0.4–1.2)
HCT: 39 % (ref 36.0–46.0)
Hemoglobin: 13.3 g/dL (ref 12.0–15.0)
Potassium: 4.1 mEq/L (ref 3.5–5.1)
Sodium: 142 mEq/L (ref 135–145)

## 2010-10-17 LAB — GLUCOSE, CAPILLARY: Glucose-Capillary: 133 mg/dL — ABNORMAL HIGH (ref 70–99)

## 2010-10-17 LAB — PROTIME-INR: INR: 2.38 — ABNORMAL HIGH (ref 0.00–1.49)

## 2010-10-20 LAB — BASIC METABOLIC PANEL
BUN: 12 mg/dL (ref 6–23)
CO2: 25 mEq/L (ref 19–32)
Calcium: 9 mg/dL (ref 8.4–10.5)
Chloride: 100 mEq/L (ref 96–112)
Chloride: 103 mEq/L (ref 96–112)
GFR calc Af Amer: >60 mL/min (ref >60)
GFR calc non Af Amer: >60 mL/min (ref >60)
GFR calc non Af Amer: >60 mL/min (ref >60)
Glucose, Bld: 102 mg/dL — ABNORMAL HIGH (ref 70–99)
Glucose, Bld: 96 mg/dL (ref 70–99)
Potassium: 3.7 mEq/L (ref 3.5–5.1)
Potassium: 3.7 mEq/L (ref 3.5–5.1)
Sodium: 134 mEq/L — ABNORMAL LOW (ref 135–145)
Sodium: 136 mEq/L (ref 135–145)

## 2010-10-20 LAB — CBC
HCT: 37.1 % (ref 36.0–46.0)
HCT: 39.5 % (ref 36.0–46.0)
Hemoglobin: 12.1 g/dL (ref 12.0–15.0)
Hemoglobin: 12.9 g/dL (ref 12.0–15.0)
MCHC: 32.5 g/dL (ref 30.0–36.0)
RBC: 5.5 MIL/uL — ABNORMAL HIGH (ref 3.87–5.11)
RDW: 15.1 % (ref 11.5–15.5)
WBC: 8.8 10*3/uL (ref 4.0–10.5)
WBC: 9.6 10*3/uL (ref 4.0–10.5)

## 2010-10-20 LAB — CARDIAC PANEL(CRET KIN+CKTOT+MB+TROPI)
CK, MB: 0.6 ng/mL (ref 0.3–4.0)
Total CK: 73 U/L (ref 7–177)
Troponin I: <0.01 ng/mL (ref 0.00–0.06)

## 2010-10-20 LAB — GLUCOSE, CAPILLARY
Glucose-Capillary: 74 mg/dL (ref 70–99)
Glucose-Capillary: 82 mg/dL (ref 70–99)
Glucose-Capillary: 85 mg/dL (ref 70–99)

## 2010-10-20 LAB — HEMOGLOBIN A1C
Hgb A1c MFr Bld: 7 % — ABNORMAL HIGH (ref 4.6–6.1)
Mean Plasma Glucose: 154 mg/dL

## 2010-10-20 LAB — CK TOTAL AND CKMB (NOT AT ARMC)
CK, MB: 0.7 ng/mL (ref 0.3–4.0)
Total CK: 74 U/L (ref 7–177)

## 2010-10-20 LAB — APTT: aPTT: 42 seconds — ABNORMAL HIGH (ref 24–37)

## 2010-10-20 LAB — PROTIME-INR
INR: 1.98 — ABNORMAL HIGH (ref 0.00–1.49)
INR: 2.07 — ABNORMAL HIGH (ref 0.00–1.49)
Prothrombin Time: 22.3 seconds — ABNORMAL HIGH (ref 11.6–15.2)

## 2010-10-20 LAB — POCT CARDIAC MARKERS: CKMB, poc: <1 ng/mL — ABNORMAL LOW (ref 1.0–8.0)

## 2010-10-20 LAB — TSH: TSH: 2.331 u[IU]/mL (ref 0.350–4.500)

## 2010-10-23 ENCOUNTER — Telehealth: Payer: Self-pay | Admitting: Sports Medicine

## 2010-10-23 ENCOUNTER — Telehealth: Payer: Self-pay | Admitting: *Deleted

## 2010-10-23 MED ORDER — CITALOPRAM HYDROBROMIDE 40 MG PO TABS: 40.0000 mg | ORAL_TABLET | Freq: Every day | ORAL | Status: DC

## 2010-10-23 MED ORDER — LISINOPRIL-HYDROCHLOROTHIAZIDE 20-25 MG PO TABS: 1.0000 | ORAL_TABLET | Freq: Every day | ORAL | Status: DC

## 2010-10-23 MED ORDER — CLONIDINE HCL 0.2 MG PO TABS: 0.2000 mg | ORAL_TABLET | Freq: Two times a day (BID) | ORAL | Status: DC

## 2010-10-23 NOTE — Telephone Encounter (Signed)
Pt says at her last visit MD was suppose to fax all her meds to [facility] for 1 year but they still do not have them, pt is out.

## 2010-10-23 NOTE — Telephone Encounter (Signed)
Pt is calling about Rx's that were to be faxed in to [facility]. She states that they have not received them. Thanks.Loralee Pacas [place]

## 2010-10-23 NOTE — Telephone Encounter (Signed)
Already sent them once, will print out and place in to-fax box.

## 2010-10-23 NOTE — Telephone Encounter (Signed)
Please fax pt Rx's to [facility]!! Pt is calling and stating that they have not received them Thanks.Loralee Pacas [place]

## 2010-10-28 ENCOUNTER — Telehealth: Payer: Self-pay | Admitting: Sports Medicine

## 2010-10-28 MED ORDER — GLIPIZIDE ER 2.5 MG PO TB24: 2.5000 mg | ORAL_TABLET | Freq: Every day | ORAL | Status: DC

## 2010-10-28 NOTE — Telephone Encounter (Signed)
Has been out of Glipizide for a while - her mail order pharm is out - needs some called to [facility] [address]

## 2010-10-28 NOTE — Telephone Encounter (Signed)
Done

## 2010-10-30 ENCOUNTER — Telehealth: Payer: Self-pay | Admitting: *Deleted

## 2010-10-30 MED ORDER — METFORMIN HCL 1000 MG PO TABS: ORAL_TABLET | ORAL | Status: DC

## 2010-10-30 NOTE — Telephone Encounter (Signed)
Sent refill to [facility] electronically. Please call and see if they received it. If they haven't received it then please just call it in. Ihor [place]. Benjamin Stain, [name]

## 2010-10-30 NOTE — Telephone Encounter (Signed)
Received call from [facility]  stating patient is requesting refill on metformin. They have not refilled for patient since July 2011 and needs new RX if patient is still taking .Will forward to MD.  Call back # [phone]

## 2010-10-31 NOTE — Telephone Encounter (Signed)
called [facility] and rx was received.

## 2010-11-06 LAB — GLUCOSE, CAPILLARY
Glucose-Capillary: 135 mg/dL — ABNORMAL HIGH (ref 70–99)
Glucose-Capillary: 136 mg/dL — ABNORMAL HIGH (ref 70–99)
Glucose-Capillary: 157 mg/dL — ABNORMAL HIGH (ref 70–99)
Glucose-Capillary: 99 mg/dL (ref 70–99)

## 2010-11-06 LAB — CBC
HCT: 34.4 % — ABNORMAL LOW (ref 36.0–46.0)
HCT: 35.9 % — ABNORMAL LOW (ref 36.0–46.0)
Hemoglobin: 11.8 g/dL — ABNORMAL LOW (ref 12.0–15.0)
MCHC: 32.8 g/dL (ref 30.0–36.0)
MCV: 74.8 fL — ABNORMAL LOW (ref 78.0–100.0)
Platelets: 245 10*3/uL (ref 150–400)
Platelets: 262 10*3/uL (ref 150–400)
RBC: 4.63 MIL/uL (ref 3.87–5.11)
RDW: 16 % — ABNORMAL HIGH (ref 11.5–15.5)
RDW: 16.1 % — ABNORMAL HIGH (ref 11.5–15.5)
WBC: 7.6 10*3/uL (ref 4.0–10.5)

## 2010-11-06 LAB — PROTIME-INR
INR: 1.29 (ref 0.00–1.49)
INR: 1.51 — ABNORMAL HIGH (ref 0.00–1.49)
Prothrombin Time: 16 seconds — ABNORMAL HIGH (ref 11.6–15.2)

## 2010-11-06 LAB — BASIC METABOLIC PANEL
BUN: 10 mg/dL (ref 6–23)
Calcium: 8.3 mg/dL — ABNORMAL LOW (ref 8.4–10.5)
GFR calc non Af Amer: >60 mL/min (ref >60)
Potassium: 3.6 mEq/L (ref 3.5–5.1)
Sodium: 137 mEq/L (ref 135–145)

## 2010-11-07 LAB — CBC
HCT: 36.8 % (ref 36.0–46.0)
HCT: 42.5 % (ref 36.0–46.0)
Hemoglobin: 11.9 g/dL — ABNORMAL LOW (ref 12.0–15.0)
Hemoglobin: 13.7 g/dL (ref 12.0–15.0)
MCHC: 32.3 g/dL (ref 30.0–36.0)
MCV: 74.1 fL — ABNORMAL LOW (ref 78.0–100.0)
MCV: 74.2 fL — ABNORMAL LOW (ref 78.0–100.0)
Platelets: 293 10*3/uL (ref 150–400)
Platelets: 311 10*3/uL (ref 150–400)
RBC: 4.96 MIL/uL (ref 3.87–5.11)
RBC: 5.74 MIL/uL — ABNORMAL HIGH (ref 3.87–5.11)
RDW: 16.1 % — ABNORMAL HIGH (ref 11.5–15.5)
WBC: 12.4 10*3/uL — ABNORMAL HIGH (ref 4.0–10.5)

## 2010-11-07 LAB — POCT CARDIAC MARKERS
CKMB, poc: <1 ng/mL — ABNORMAL LOW (ref 1.0–8.0)
Myoglobin, poc: 108 ng/mL (ref 12–200)
Troponin i, poc: <0.05 ng/mL (ref 0.00–0.09)

## 2010-11-07 LAB — COMPREHENSIVE METABOLIC PANEL
ALT: 22 U/L (ref 0–35)
AST: 26 U/L (ref 0–37)
Albumin: 3.3 g/dL — ABNORMAL LOW (ref 3.5–5.2)
CO2: 28 mEq/L (ref 19–32)
Calcium: 8.4 mg/dL (ref 8.4–10.5)
Chloride: 104 mEq/L (ref 96–112)
GFR calc Af Amer: 60 mL/min — ABNORMAL LOW (ref >60)
GFR calc non Af Amer: 49 mL/min — ABNORMAL LOW (ref >60)
Sodium: 138 mEq/L (ref 135–145)
Total Bilirubin: 0.3 mg/dL (ref 0.3–1.2)

## 2010-11-07 LAB — BASIC METABOLIC PANEL
BUN: 15 mg/dL (ref 6–23)
CO2: 21 mEq/L (ref 19–32)
Calcium: 8.7 mg/dL (ref 8.4–10.5)
Chloride: 102 mEq/L (ref 96–112)
Glucose, Bld: 238 mg/dL — ABNORMAL HIGH (ref 70–99)
Potassium: 4.5 mEq/L (ref 3.5–5.1)
Sodium: 137 mEq/L (ref 135–145)

## 2010-11-07 LAB — DIFFERENTIAL
Basophils Absolute: 0.4 10*3/uL — ABNORMAL HIGH (ref 0.0–0.1)
Basophils Relative: 2 % — ABNORMAL HIGH (ref 0–1)
Eosinophils Relative: 0 % (ref 0–5)
Lymphs Abs: 7.5 10*3/uL — ABNORMAL HIGH (ref 0.7–4.0)
Monocytes Absolute: 0.5 10*3/uL (ref 0.1–1.0)
Neutro Abs: 9.9 10*3/uL — ABNORMAL HIGH (ref 1.7–7.7)

## 2010-11-07 LAB — CARDIAC PANEL(CRET KIN+CKTOT+MB+TROPI)
CK, MB: 0.9 ng/mL (ref 0.3–4.0)
Relative Index: 1.1 (ref 0.0–2.5)
Relative Index: INVALID (ref 0.0–2.5)
Total CK: 140 U/L (ref 7–177)
Total CK: 65 U/L (ref 7–177)
Troponin I: 0.02 ng/mL (ref 0.00–0.06)

## 2010-11-07 LAB — TSH: TSH: 2.312 u[IU]/mL (ref 0.350–4.500)

## 2010-11-07 LAB — GLUCOSE, CAPILLARY
Glucose-Capillary: 139 mg/dL — ABNORMAL HIGH (ref 70–99)
Glucose-Capillary: 155 mg/dL — ABNORMAL HIGH (ref 70–99)
Glucose-Capillary: 159 mg/dL — ABNORMAL HIGH (ref 70–99)
Glucose-Capillary: 226 mg/dL — ABNORMAL HIGH (ref 70–99)
Glucose-Capillary: 245 mg/dL — ABNORMAL HIGH (ref 70–99)

## 2010-11-07 LAB — CK TOTAL AND CKMB (NOT AT ARMC)
CK, MB: 0.7 ng/mL (ref 0.3–4.0)
Relative Index: INVALID (ref 0.0–2.5)

## 2010-11-07 LAB — HEMOGLOBIN A1C: Mean Plasma Glucose: 189 mg/dL

## 2010-11-07 LAB — APTT: aPTT: 28 seconds (ref 24–37)

## 2010-11-07 LAB — TROPONIN I: Troponin I: 0.01 ng/mL (ref 0.00–0.06)

## 2010-11-07 LAB — LIPID PANEL
HDL: 40 mg/dL (ref >39)
Triglycerides: 193 mg/dL — ABNORMAL HIGH (ref <150)
VLDL: 39 mg/dL (ref 0–40)

## 2010-11-07 LAB — PROTIME-INR
INR: 1.13 (ref 0.00–1.49)
INR: 1.22 (ref 0.00–1.49)
Prothrombin Time: 14.4 seconds (ref 11.6–15.2)

## 2010-11-07 LAB — HEPARIN LEVEL (UNFRACTIONATED): Heparin Unfractionated: 0.43 IU/mL (ref 0.30–0.70)

## 2010-11-10 LAB — BASIC METABOLIC PANEL
BUN: 10 mg/dL (ref 6–23)
CO2: 27 mEq/L (ref 19–32)
Calcium: 9.1 mg/dL (ref 8.4–10.5)
Creatinine, Ser: 0.99 mg/dL (ref 0.4–1.2)
GFR calc non Af Amer: 58 mL/min — ABNORMAL LOW (ref >60)
Glucose, Bld: 128 mg/dL — ABNORMAL HIGH (ref 70–99)
Potassium: 3.4 mEq/L — ABNORMAL LOW (ref 3.5–5.1)

## 2010-11-10 LAB — POCT CARDIAC MARKERS
CKMB, poc: <1 ng/mL — ABNORMAL LOW (ref 1.0–8.0)
Myoglobin, poc: 123 ng/mL (ref 12–200)
Troponin i, poc: <0.05 ng/mL (ref 0.00–0.09)

## 2010-11-10 LAB — DIFFERENTIAL
Basophils Absolute: 0.1 10*3/uL (ref 0.0–0.1)
Basophils Relative: 2 % — ABNORMAL HIGH (ref 0–1)
Lymphs Abs: 2.9 10*3/uL (ref 0.7–4.0)
Neutro Abs: 4.6 10*3/uL (ref 1.7–7.7)
Neutrophils Relative %: 56 % (ref 43–77)

## 2010-11-10 LAB — CARDIAC PANEL(CRET KIN+CKTOT+MB+TROPI)
CK, MB: 0.9 ng/mL (ref 0.3–4.0)
CK, MB: 1 ng/mL (ref 0.3–4.0)
Relative Index: 0.7 (ref 0.0–2.5)
Total CK: 133 U/L (ref 7–177)
Total CK: 142 U/L (ref 7–177)
Troponin I: <0.01 ng/mL (ref 0.00–0.06)

## 2010-11-10 LAB — POCT I-STAT, CHEM 8
BUN: 12 mg/dL (ref 6–23)
Chloride: 105 mEq/L (ref 96–112)
HCT: 42 % (ref 36.0–46.0)
Hemoglobin: 14.3 g/dL (ref 12.0–15.0)
Potassium: 3.7 mEq/L (ref 3.5–5.1)
Sodium: 141 mEq/L (ref 135–145)
TCO2: 24 mmol/L (ref 0–100)

## 2010-11-10 LAB — HEMOGLOBIN A1C: Hgb A1c MFr Bld: 7.4 % — ABNORMAL HIGH (ref 4.6–6.1)

## 2010-11-10 LAB — CK TOTAL AND CKMB (NOT AT ARMC)
CK, MB: 0.8 ng/mL (ref 0.3–4.0)
Total CK: 150 U/L (ref 7–177)

## 2010-11-10 LAB — CBC
Hemoglobin: 12.2 g/dL (ref 12.0–15.0)
MCHC: 32 g/dL (ref 30.0–36.0)
Platelets: 283 10*3/uL (ref 150–400)
RDW: 14.8 % (ref 11.5–15.5)

## 2010-11-10 LAB — TSH: TSH: 3.138 u[IU]/mL (ref 0.350–4.500)

## 2010-11-12 ENCOUNTER — Ambulatory Visit (INDEPENDENT_AMBULATORY_CARE_PROVIDER_SITE_OTHER): Payer: Medicaid Other | Admitting: *Deleted

## 2010-11-12 DIAGNOSIS — I4891 Unspecified atrial fibrillation: Secondary | ICD-10-CM

## 2010-11-12 DIAGNOSIS — Z7901 Long term (current) use of anticoagulants: Secondary | ICD-10-CM

## 2010-11-12 LAB — POCT INR: INR: 2.7

## 2010-11-18 ENCOUNTER — Encounter: Payer: Self-pay | Admitting: Sports Medicine

## 2010-11-18 ENCOUNTER — Ambulatory Visit (INDEPENDENT_AMBULATORY_CARE_PROVIDER_SITE_OTHER): Payer: Medicaid Other | Admitting: *Deleted

## 2010-11-18 ENCOUNTER — Ambulatory Visit (HOSPITAL_COMMUNITY)
Admission: RE | Admit: 2010-11-18 | Discharge: 2010-11-18 | Disposition: A | Discharge status: Home / Self Care | Payer: Medicaid Other | Source: Ambulatory Visit | Attending: Sports Medicine | Admitting: Sports Medicine

## 2010-11-18 ENCOUNTER — Ambulatory Visit (INDEPENDENT_AMBULATORY_CARE_PROVIDER_SITE_OTHER): Payer: Medicaid Other | Admitting: Sports Medicine

## 2010-11-18 DIAGNOSIS — I10 Essential (primary) hypertension: Secondary | ICD-10-CM

## 2010-11-18 DIAGNOSIS — E119 Type 2 diabetes mellitus without complications: Secondary | ICD-10-CM

## 2010-11-18 DIAGNOSIS — F3289 Other specified depressive episodes: Secondary | ICD-10-CM

## 2010-11-18 DIAGNOSIS — R51 Headache: Secondary | ICD-10-CM

## 2010-11-18 DIAGNOSIS — Z7901 Long term (current) use of anticoagulants: Secondary | ICD-10-CM

## 2010-11-18 DIAGNOSIS — R519 Headache, unspecified: Secondary | ICD-10-CM | POA: Insufficient documentation

## 2010-11-18 DIAGNOSIS — R209 Unspecified disturbances of skin sensation: Secondary | ICD-10-CM | POA: Insufficient documentation

## 2010-11-18 DIAGNOSIS — I4891 Unspecified atrial fibrillation: Secondary | ICD-10-CM

## 2010-11-18 DIAGNOSIS — E785 Hyperlipidemia, unspecified: Secondary | ICD-10-CM

## 2010-11-18 DIAGNOSIS — F329 Major depressive disorder, single episode, unspecified: Secondary | ICD-10-CM

## 2010-11-18 LAB — COMPREHENSIVE METABOLIC PANEL
ALT: 16 U/L (ref 0–35)
Alkaline Phosphatase: 94 U/L (ref 39–117)
BUN: 12 mg/dL (ref 6–23)
CO2: 27 mEq/L (ref 19–32)
Chloride: 102 mEq/L (ref 96–112)
Creatinine, Ser: 0.87 mg/dL (ref 0.4–1.2)
GFR calc Af Amer: >60 mL/min (ref >60)
GFR calc non Af Amer: >60 mL/min (ref >60)
Glucose, Bld: 104 mg/dL — ABNORMAL HIGH (ref 70–99)
Potassium: 3.5 mEq/L (ref 3.5–5.1)
Total Bilirubin: 0.4 mg/dL (ref 0.3–1.2)

## 2010-11-18 LAB — CBC
HCT: 40.3 % (ref 36.0–46.0)
HCT: 41.2 % (ref 36.0–46.0)
Hemoglobin: 12.3 g/dL (ref 12.0–15.0)
Hemoglobin: 12.7 g/dL (ref 12.0–15.0)
Hemoglobin: 13.1 g/dL (ref 12.0–15.0)
MCHC: 31.5 g/dL (ref 30.0–36.0)
MCHC: 31.8 g/dL (ref 30.0–36.0)
MCHC: 32 g/dL (ref 30.0–36.0)
MCHC: 32.2 g/dL (ref 30.0–36.0)
MCV: 71.9 fL — ABNORMAL LOW (ref 78.0–100.0)
MCV: 72.8 fL — ABNORMAL LOW (ref 78.0–100.0)
MCV: 72.9 fL — ABNORMAL LOW (ref 78.0–100.0)
Platelets: 253 10*3/uL (ref 150–400)
Platelets: 256 10*3/uL (ref 150–400)
Platelets: 284 10*3/uL (ref 150–400)
RBC: 5.3 MIL/uL — ABNORMAL HIGH (ref 3.87–5.11)
RBC: 5.65 MIL/uL — ABNORMAL HIGH (ref 3.87–5.11)
RDW: 15.2 % (ref 11.5–15.5)
RDW: 15.4 % (ref 11.5–15.5)
WBC: 6.6 10*3/uL (ref 4.0–10.5)
WBC: 7.1 10*3/uL (ref 4.0–10.5)
WBC: 7.3 10*3/uL (ref 4.0–10.5)
WBC: 7.5 10*3/uL (ref 4.0–10.5)

## 2010-11-18 LAB — DIFFERENTIAL
Basophils Absolute: 0 10*3/uL (ref 0.0–0.1)
Basophils Absolute: 0.1 10*3/uL (ref 0.0–0.1)
Basophils Relative: 0 % (ref 0–1)
Basophils Relative: 1 % (ref 0–1)
Eosinophils Absolute: 0.1 10*3/uL (ref 0.0–0.7)
Eosinophils Absolute: 0.1 10*3/uL (ref 0.0–0.7)
Eosinophils Relative: 1 % (ref 0–5)
Lymphocytes Relative: 36 % (ref 12–46)
Lymphs Abs: 2.7 10*3/uL (ref 0.7–4.0)
Monocytes Absolute: 0.4 10*3/uL (ref 0.1–1.0)
Monocytes Absolute: 0.5 10*3/uL (ref 0.1–1.0)
Monocytes Relative: 6 % (ref 3–12)
Neutro Abs: 3.7 10*3/uL (ref 1.7–7.7)
Neutro Abs: 4.2 10*3/uL (ref 1.7–7.7)
Neutrophils Relative %: 53 % (ref 43–77)
Neutrophils Relative %: 57 % (ref 43–77)

## 2010-11-18 LAB — URINE MICROSCOPIC-ADD ON

## 2010-11-18 LAB — BASIC METABOLIC PANEL
BUN: 13 mg/dL (ref 6–23)
BUN: 14 mg/dL (ref 6–23)
CO2: 25 mEq/L (ref 19–32)
CO2: 31 mEq/L (ref 19–32)
Calcium: 8.9 mg/dL (ref 8.4–10.5)
Calcium: 9 mg/dL (ref 8.4–10.5)
Calcium: 9.2 mg/dL (ref 8.4–10.5)
Calcium: 9.6 mg/dL (ref 8.4–10.5)
Chloride: 103 mEq/L (ref 96–112)
Chloride: 105 mEq/L (ref 96–112)
Chloride: 105 mEq/L (ref 96–112)
Creatinine, Ser: 0.86 mg/dL (ref 0.4–1.2)
Creatinine, Ser: 0.99 mg/dL (ref 0.4–1.2)
Creatinine, Ser: 1.01 mg/dL (ref 0.4–1.2)
Creatinine, Ser: 1.07 mg/dL (ref 0.4–1.2)
GFR calc Af Amer: >60 mL/min (ref >60)
GFR calc Af Amer: >60 mL/min (ref >60)
GFR calc Af Amer: >60 mL/min (ref >60)
GFR calc Af Amer: >60 mL/min (ref >60)
GFR calc non Af Amer: 59 mL/min — ABNORMAL LOW (ref >60)
Glucose, Bld: 131 mg/dL — ABNORMAL HIGH (ref 70–99)
Potassium: 3.8 mEq/L (ref 3.5–5.1)
Sodium: 139 mEq/L (ref 135–145)
Sodium: 144 mEq/L (ref 135–145)

## 2010-11-18 LAB — URINALYSIS, ROUTINE W REFLEX MICROSCOPIC
Bilirubin Urine: NEGATIVE
Glucose, UA: NEGATIVE mg/dL
Hgb urine dipstick: NEGATIVE
Ketones, ur: NEGATIVE mg/dL
Nitrite: NEGATIVE
Protein, ur: NEGATIVE mg/dL
Specific Gravity, Urine: 1.009 (ref 1.005–1.030)
Urobilinogen, UA: 1 mg/dL (ref 0.0–1.0)
pH: 6 (ref 5.0–8.0)

## 2010-11-18 LAB — PROTIME-INR: Prothrombin Time: 14.3 seconds (ref 11.6–15.2)

## 2010-11-18 LAB — LIPID PANEL
Triglycerides: 98 mg/dL (ref <150)
VLDL: 20 mg/dL (ref 0–40)

## 2010-11-18 LAB — GLUCOSE, CAPILLARY: Glucose-Capillary: 110 mg/dL — ABNORMAL HIGH (ref 70–99)

## 2010-11-18 LAB — RAPID URINE DRUG SCREEN, HOSP PERFORMED
Cocaine: NOT DETECTED
Opiates: NOT DETECTED
Tetrahydrocannabinol: NOT DETECTED

## 2010-11-18 LAB — HEMOGLOBIN A1C: Hgb A1c MFr Bld: 6.5 % — ABNORMAL HIGH (ref 4.6–6.1)

## 2010-11-18 LAB — POCT INR: INR: 1.8

## 2010-11-18 LAB — APTT: aPTT: 29 seconds (ref 24–37)

## 2010-11-18 LAB — HOMOCYSTEINE: Homocysteine: 13.2 umol/L (ref 4.0–15.4)

## 2010-11-18 LAB — POCT GLYCOSYLATED HEMOGLOBIN (HGB A1C): Hemoglobin A1C: 8

## 2010-11-18 MED ORDER — CITALOPRAM HYDROBROMIDE 40 MG PO TABS: 40.0000 mg | ORAL_TABLET | Freq: Every day | ORAL | Status: DC

## 2010-11-18 MED ORDER — METFORMIN HCL 1000 MG PO TABS: ORAL_TABLET | ORAL | Status: DC

## 2010-11-18 MED ORDER — GLIPIZIDE ER 2.5 MG PO TB24: 2.5000 mg | ORAL_TABLET | Freq: Every day | ORAL | Status: DC

## 2010-11-18 MED ORDER — WARFARIN SODIUM 5 MG PO TABS: 5.0000 mg | ORAL_TABLET | Freq: Every day | ORAL | Status: DC

## 2010-11-18 MED ORDER — AMLODIPINE BESYLATE 10 MG PO TABS: 10.0000 mg | ORAL_TABLET | Freq: Every day | ORAL | Status: DC

## 2010-11-18 MED ORDER — OMEPRAZOLE 20 MG PO CPDR: 20.0000 mg | DELAYED_RELEASE_CAPSULE | Freq: Every day | ORAL | Status: DC

## 2010-11-18 MED ORDER — METOPROLOL SUCCINATE ER 25 MG PO TB24: 25.0000 mg | ORAL_TABLET | Freq: Every day | ORAL | Status: DC

## 2010-11-18 MED ORDER — CLONIDINE HCL 0.2 MG PO TABS: 0.2000 mg | ORAL_TABLET | Freq: Two times a day (BID) | ORAL | Status: DC

## 2010-11-18 MED ORDER — CELECOXIB 200 MG PO CAPS: 200.0000 mg | ORAL_CAPSULE | Freq: Every day | ORAL | Status: DC

## 2010-11-18 MED ORDER — LISINOPRIL-HYDROCHLOROTHIAZIDE 20-25 MG PO TABS: 1.0000 | ORAL_TABLET | Freq: Every day | ORAL | Status: DC

## 2010-11-18 MED ORDER — HYDROCODONE-ACETAMINOPHEN 5-500 MG PO TABS: 1.0000 | ORAL_TABLET | ORAL | Status: DC | PRN

## 2010-11-18 MED ORDER — SIMVASTATIN 20 MG PO TABS: 20.0000 mg | ORAL_TABLET | Freq: Every day | ORAL | Status: DC

## 2010-11-18 NOTE — Assessment & Plan Note (Addendum)
Deteriorated, suspect related to her pain. Reassess at next visit.

## 2010-11-18 NOTE — Assessment & Plan Note (Addendum)
Checking A1c.

## 2010-11-18 NOTE — Progress Notes (Signed)
  Subjective:    Patient ID: [name], female    DOB: [birth year], 55 y.o.   MRN: [id number]  HPI [name] comes in with 2-3 weeks of severe headache posterior left aspect after hitting her head 2-3 weeks ago.  Since then she has had numbness in her right face and mouth.  She is on coumadin.  No slurred speech, no N/V, no visual changes, no unilateral weakness, numbness on body.  No other complaints.  Also needs refills on all meds, and needs DM2 meds sent to [facility].   Review of Systems See HPI    Objective:   Physical Exam  Constitutional: She appears well-developed and well-nourished. No distress.  HENT:  Head: Normocephalic and atraumatic.  Eyes: Pupils are equal, round, and reactive to light.  Neck: Normal range of motion. Neck supple.  Cardiovascular: Normal rate, regular rhythm and normal heart sounds.  Exam reveals no gallop and no friction rub.   No murmur heard. Pulmonary/Chest: Breath sounds normal. No respiratory distress. She has no wheezes. She has no rales. She exhibits no tenderness.  Neurological: She is alert. She has normal reflexes. She displays normal reflexes. No cranial nerve deficit. She exhibits normal muscle tone. Coordination normal.       Sensation grossly intact.  Skin: Skin is warm and dry.          Assessment & Plan:

## 2010-11-18 NOTE — Assessment & Plan Note (Signed)
Mood ok per pt. Taking celexa as directed.

## 2010-11-18 NOTE — Patient Instructions (Signed)
Great to see you, Checking some bloodwork. Refilled all your meds. Diabetes meds to [facility]. Go to the hospital radiology dept now for a cat scan of your head. Come back to see me in 3 months.  [name]. Benjamin Stain, [name]

## 2010-11-18 NOTE — Assessment & Plan Note (Addendum)
Checking Lipid panel...unable to stick pt, she will return for future lipid panel.

## 2010-11-18 NOTE — Assessment & Plan Note (Addendum)
Neuro exam unremarkable. However hit head 2-3 wks ago. Now with R mouth numbness and L posterior HA. Checking INR, STAT CT head, pt to remain in radiology until exam done, results to be called to me. vicodin for pain, pt will pick up only after scan done.  ..Marland KitchenCT-scan head negative for acute intracranial processes, pt to use vicodin for pain and RTC 3 months to reassess.

## 2010-12-10 ENCOUNTER — Ambulatory Visit (INDEPENDENT_AMBULATORY_CARE_PROVIDER_SITE_OTHER): Payer: Medicaid Other | Admitting: *Deleted

## 2010-12-10 DIAGNOSIS — Z7901 Long term (current) use of anticoagulants: Secondary | ICD-10-CM

## 2010-12-10 DIAGNOSIS — I4891 Unspecified atrial fibrillation: Secondary | ICD-10-CM

## 2010-12-10 LAB — POCT INR: INR: 2

## 2010-12-17 NOTE — Op Note (Signed)
[name], [name]               ACCOUNT NO.:  [id number]   MEDICAL RECORD NO.:  [id number]          PATIENT TYPE:  AMB   LOCATION:  DSC                          FACILITY:  [facility]   PHYSICIAN:  Feliberto Gottron. Turner Daniels, [name]   DATE OF BIRTH:  [birth year]   DATE OF PROCEDURE:  10/05/2007  DATE OF DISCHARGE:                               OPERATIVE REPORT   PREOPERATIVE DIAGNOSIS:  Right knee medial meniscal tear by magnetic  resonance imaging scan.   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   PROCEDURE:  Right knee arthroscopic partial lateral meniscectomy for  radial tear and degenerative tearing and debridement of chondromalacia.   SURGEON:  Dr. Feliberto Gottron. Turner Daniels, [name]   ASSISTANT:  None.   ANESTHESIA:  Local anesthesia, IV sedation.   ESTIMATED BLOOD LOSS:  Minimal.   FLUIDS REPLACED:  Was 600 mL of crystalloid.   DRAINS PLACED:  None.   TOURNIQUET TIME:  None.   INDICATIONS FOR PROCEDURE:  The patient is a 55 year old woman with  catching, popping and pain in her knee.  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  She was previously treated by Dr. Otho Darner,  who did order the MRI scan.  This showed a 2+ effusion as well as  extruded medial meniscal tear.  Because of persistent pain, catching and  popping, she desires an elective arthroscopic evaluation and treatment  of her right knee.  The risks and benefits of surgery were discussed and  questions answered.   DESCRIPTION OF PROCEDURE:  The patient was identified by arm band and  underwent a right knee block anesthesia through the [facility] without difficulty.  She was then taken to  the operating room where  the appropriate anesthetic monitors were  attached and IV sedation administered.  The lateral posts pulled out on  the table and  the right lower extremity prepped and draped in the usual  sterile fashion from the ankle to the mid-thigh.  Using a #11 blade, a  standard inferomedial and inferolateral peripatellar portals were then  made, allowing introduction of the arthroscope through the inferolateral  portal and the outflow through the inferomedial portal.  A diagnostic  arthroscopy revealed a normal superior trochlea.  The patella had grade  2 to grade 3 chondromalacia of the lateral facet, which was debrided.  Moving down the trochlea towards the notch, we found an area of grade 3  chondromalacia with flap tears, focal grade 4.  This was debrided back  to a stable margin.  The lateral tibial plateau had grade 3  chondromalacia.  This was debrided back to a stable margin with flap  tears smoothed out.  The gutters were cleared medially and laterally.  The arthroscopic instruments were removed, after irrigating the  knee out  with normal saline solution.  A dressing of Xeroform, 4 x 4 dressing,  sponges, Webril and an Ace wrap were applied.   The patient was then undraped, awakened and taken to the recovery room  without difficulty.      Feliberto Gottron. Turner Daniels, [name]  Electronically Signed     FJR/MEDQ  D:  10/05/2007  T:  10/05/2007  Job:  [id number]

## 2010-12-17 NOTE — H&P (Signed)
[name], [name] NO.:  [id number]   MEDICAL RECORD NO.:  [id number]          PATIENT TYPE:  INP   LOCATION:  4711                         FACILITY:  [facility]   PHYSICIAN:  Paula Compton, MD        DATE OF BIRTH:  April 22, 1956   DATE OF ADMISSION:  03/01/2009  DATE OF DISCHARGE:                              HISTORY & PHYSICAL   PRIMARY CARE PHYSICIAN:  Rodney Langton, [facility] at the [facility].   CHIEF COMPLAINT:  Chest pain.   HISTORY OF PRESENT ILLNESS:  This is a 55-year female who was seen in  the emergency room for left upper quadrant mouth pain secondary to tooth  caries.  While in the ED the patient endorsed left-sided chest pain x3  days.  Chest pain is described as a squeezing pain that is always  present, graded 7 out of 10 in severity.  The patient  states the pain  does not change with position.  Pain does radiate to the neck but not to  the jaw, back or arm.  Pain is not relieved or activated with position  change or exertion or rest.  The patient is diaphoretic but states that  she has been like this since her last CVA in January 2010.  She denies  shortness of breath, nausea or vomiting.  The patient has sublingual  nitroglycerin at home to use for chest pain but did not use it.  In the  emergency room the patient received nitroglycerin times one without much  relief, aspirin 325 mg p.o. times one.   PAST MEDICAL HISTORY:  1. 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  2. Hypertension.  3. Hyperlipidemia.  4. Lumbago.  5. Renal disease, chronic stage III.  6. 2-D echo in January 2010 that showed ejection fraction of 65% with      normal left ventricular systolic function.  7. Carotid Dopplers on January 2010 that showed left ICA stenosis of      40-60%, vertebral artery flow antegrade, right ICA with no      stenosis.  8. Stress test negative in March, 2010 done by Dr.  Sharyn Lull.   PAST SURGICAL HISTORY:  1.  Tonsillitis, adenoidopathy at age 55.  2. 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  3. Carpal tunnel release.  4. Right knee meniscus tear in March 2009 by Dr. Turner Daniels.   FAMILY HISTORY:  Has 15 brothers and sisters, some with hypertension.  One sister died of  lung cancer.  Mother died of congestive heart  failure at age 37.  Father is still alive and has diabetes and  hypertension.   SOCIAL HISTORY:  The patient has a high school education.  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  She is separated from her husband.  She has 3  children.  She lives with her grandson and her youngest  daughter.  Denies alcohol, tobacco and drug use.   ALLERGIES:  No known drug allergies.   MEDICATIONS:  1. Lisinopril 40 mg 2 tablets p.o. daily.  2. Clonidine 0.2  mg 1 tablet at mouth b.i.d.  3. Hydrochlorothiazide 25 mg 1 tablet daily.  4. Norvasc 10 mg 1 tablet daily.  5. Plavix 75 mg 1 tablet daily.  6. Simvastatin 20 mg 1 tablet daily.  7. Celexa 10 mg 1 tablet daily.  8. Coreg 3.125 1 tablet twice daily.  9. Imdur 30mg  po qAM.   REVIEW OF SYSTEMS:  The patient had loose stool x one yesterday.  Headache is present since her CVA in January 2010.  Denies fevers,  chills pain.  Pain in left upper quadrant of mouth secondary to caries.   PHYSICAL EXAMINATION:  VITAL SIGNS:  Temperature  98.6, pulse 86,  respiratory rate 19, blood pressure 193/96.  Next blood pressure 156/76.  Next  blood pressure 152/79.  GENERAL:  The patient is obese, in no acute distress, alert, appropriate  and cooperative to exam.  HEAD:  Normocephalic, atraumatic.  Eyes: Extraocular motility  intact.  Pupils equal, round, react to light and accommodation.  Funduscopic exam  benign without hemorrhage, exudates or papilledema.  Mouth poor  dentition.  Teeth missing, gingival inflammation.  NECK:  Acanthosis nigrans, no bruits.  CHEST:  Chest wall no deformities, masses.  There is mild grimace to the   patient's face when  left chest wall is palpated.  LUNGS:  Normal respiratory effort.  Chest expands symmetrically.  Lungs  are clear to auscultation.  No crackles or wheezes.  HEART:  Normal rate  and rhythm.  S1 and S2 normal without gallop,  murmurs, clicks, rubs or  other extra sounds.  Next ABDOMEN:  Bowel sounds positive.  Abdomen is  obese but soft and nontender without masses, organomegaly or hernias  noted.  EXTREMITIES:  No clubbing, cyanosis or edema or deformity noted.  Full range of motion.  NEUROLOGIC:  Cranial nerves II through XII grossly intact.  Reflexes  equal bilaterally.  Sensory, motor and coordinative  functions appear  intact.  PSYCHIATRIC:  Oriented x3.   LABS:  1. EKG normal sinus rhythm.  2. Chest x-ray with no acute cardiopulmonary process.  3. Point of care cardiac enzymes with myoglobin 123, CK-MB less than      1, troponin less than 0.05.  4. iSTAT with sodium 141, potassium 3.7, chloride 105, CO2 24, BUN 12,      creatinine 0.9, glucose 119,  hemoglobin 14.3, hematocrit 42.0.   ASSESSMENT AND PLAN:  This is a 55 year old female with:  1. Chest pain.  Chest pain most likely not related to MI in etiology      since point of care cardiac enzymes negative and EKG shows no new      changes.  Chest pain is more atypical in nature as it is      reproducible.  The patient had negative chemical stress test done      by Dr. Sharyn Lull in  March of this year which was negative.  Chest      pain most likely not pulmonary emboli in origin since chest pain is      not pleuritic, the patient is not tachycardic, tachypneic or      hypoxic and she does not have a history with red flag of  travel or      decreased mobility.  We will admit the patient to telemetry for      observation.  We will cycle cardiac enzymes x3 and will repeat EKG      in the morning.  We  will also get a repeat BMET, CBC and TSH.  The      patient has been lost to follow-up with Dr. Sharyn Lull and  since the      patient is still having chest pain, we will set up an appointment      with Dr. Sharyn Lull  to see the patient on outpatient basis.  2. Hypertension.  The patient's blood pressure has been difficult to      control in the past.  She has not taken her Norvasc in at least 3      weeks.  We will resume her hypertensive medications including      lisinopril, clonidine, hydrochlorothiazide, Norvasc and Imdur.  3. Hyperlipidemia fasting lipid done at the [facility] on      November 23, 2008 showed LDL 73, HDL 45, triglycerides of 101.  We      will continue simvastatin 20 mg p.o. daily.  4. Cerebrovascular accident.  The patient has a history of CVA x3 with      the  most recent one in January 2010.  The patient is supposed to      be on Plavix 75 mg but has not taken this medication in almost one      week.  We will resume her Plavix at this time and the patient will      need to follow up with Dr. Pearlean Brownie on an outpatient basis.  5. Adjustment disorder without  depressive mood.  We will continue      Celexa 10 mg p.o. daily.  6. Dental caries.  No abscesses seen on exam.  The patient is      afebrile.  We will start the patient on amoxicillin 500 mg p.o.      twice daily and the patient will need to follow up with a dentist.  7. History of angina.  The patient will continue on Imdur 30 mg p.o.      q.a.m. that was prescribed to her by Dr. Sharyn Lull.  8. Questionable diabetes.  The patient has acanthosis nigrans on her      neck on exam but does not have a diagnosis of diabetes in her      history.  iSTAT  showed random sugar of 119.  We will get A1c at      this visit.  9. Fluids, electrolytes and nutrition/ GI:  Electrolytes are stable on      iSTAT.  We will repeat BMET.  The patient is on heart healthy diet.      No IV fluids needed at this time.  10.Prophylaxis.  The patient is to be started on Plavix 75 mg p.o.      daily.  11.Code status:  The patient is full code.   12.Disposition.  Disposition is pending negative cardiac enzymes and      no changes on repeat EKG.  The patient to follow up with Dr.      Sharyn Lull on outpatient basis.      Angeline Slim, MD  Electronically Signed      Paula Compton, MD  Electronically Signed    CT/MEDQ  D:  03/01/2009  T:  03/01/2009  Job:  [id number]   cc:   Monica Becton, MD

## 2010-12-17 NOTE — Discharge Summary (Signed)
NAMECECIL, [name]               ACCOUNT NO.:  [id number]   MEDICAL RECORD NO.:  [id number]           PATIENT TYPE:   LOCATION:                                 FACILITY:   PHYSICIAN:  Wayne A. Sheffield Slider, [name]    DATE OF BIRTH:  30-Mar-1956   DATE OF ADMISSION:  DATE OF DISCHARGE:                               DISCHARGE SUMMARY   ATTENDING PHYSICIAN:  Leighton Roach McDiarmid, MD   DIAGNOSES AT ADMISSION:  1. New-onset stroke, old right-sided pontine stroke.  2. Hypertension.   DIAGNOSES AT DISCHARGE:  1. Left-sided paramedian pontine stroke, lacunar.  2. 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  3. Hypertension.   RADIOLOGIC PROCEDURES DONE DURING ADMISSION:  1. CT head without contrast showed an old lacunar infarct in the right      side of the pons and possibly the inferior right basilar ganglia      that were unchanged.  2. MRI of the head showed acute very small nonhemorrhagic left      paracentral pontine infarct, acute small nonhemorrhagic      anteromedial left frontal lobe infarct, and scattered nonspecific      blood breakdown products as described above.  3. MRA of the neck showed moderate intracranial atherosclerotic-type      changes, most notably involving branch vessels.  There was also      narrowing of the basilar artery, anteroinferior cerebellar artery      was poorly delineated, and there was some vertebral artery ectasia.  4. There was a 2-D echocardiogram that was done that should be      followed up.  5. There were also carotid Dopplers that showed on the right no      internal carotid artery stenosis, vertical artery flow was      antegrade; on the left side there was 40-60% distal internal      carotid artery stenosis; however, there was an increasing velocity      that may be due to tortuosity.  Vertebral artery flow was antegrade      on the left side.  6. The patient had a bedside swallow eval that was passed.   CONSULTATION DURING ADMISSION:  Neurology.   HISTORY:  Briefly, the patient started having symptoms of mouth numbness  and tingling on the right side of her mouth on day of admission.  At  around 2 o'clock p.m., the patient became nauseous, had slowed speech,  and was stumbling around and noticed that she had a right-sided facial  droop with double vision originating on the right.  She had had a stroke  in the past and recognized the symptoms, so she came to the ED  immediately.  She was accompanied by her daughter, who noticed her  slurred speech.  The patient claims to be taking all of her blood  pressure meds as described and has a history of hypertensive urgency  with face numbness in October 2007.  At that time, MRI and MRA found  chronic microvascular disease and a  remote lacunar infarct.   HOSPITAL COURSE:  1. Cerebrovascular accident:  The patient came in too late for use of      thrombolytics.  She followed a steady course in the hospital with      mild improvement of the right-sided lip numbness but with no      improvement in her strabismus.  These symptoms as well as MRI      findings were most suggestive of a left medial longitudinal      fasciculus lacunar infarct.  2. Hypertension:  The patient's blood pressure was permissively      allowed to be somewhat elevated around 150s-160s.  On the day prior      to discharge, the patient had blood pressures that were elevated      into the 200s/110s.  She was given hydralazine 10 mg IV q.2 h.      p.r.n., which brought her blood pressure down somewhat.  By      discharge, her pressures were in the 160s-170s/90s.  They will not      be brought down precipitantly as not to infarct the penumbra.  The      patient will be continued on her home blood pressure medications,      clonidine and lisinopril/hydrochlorothiazide and Norvasc will be      added as an outpatient.   DISCHARGE CONDITION:  Stable and improved.   Disposition will be to home.  Issues to follow up are control  of the  patient's hypertension, results of the patient's 2-D echocardiogram, and  rehabilitation and physical therapy.   MEDICATIONS AT DISCHARGE:  1. Meloxicam 15 mg p.o. every morning for pain.  2. Lisinopril/hydrochlorothiazide 20/25 mg 1 tablet daily.  3. Norvasc 5 mg p.o. daily, a prescription was written for this.  4. Clonidine 0.2 mg 1 tablet p.o. b.i.d.  5. Plavix 75 mg p.o. daily, a prescription was written for this and      the patient was given 2 coupons that were each good for 14 tablets.  6. Zocor 20 mg p.o. daily, a prescription was written for this.  7. The patient is also to take home her right eye patch that was given      in the hospital, which helps with her nausea and her imbalance.   Followup will be with Dr. Sylvan Cheese at the [facility] on August 22, 2008, at 9:15 a.m.  Followup will also be with the  [facility] on [facility], their phone number  is [phone], and they will call the patient to set up an appointment.  Followup will also be with Dr. Pearlean Brownie at [facility],  their phone number is [phone], the patient has been given her  appointment date in March 2010 and has agreed to follow up on that date.      Rodney Langton, MD  Electronically Signed      Arnette Norris. Sheffield Slider, [name]  Electronically Signed    TT/MEDQ  D:  08/18/2008  T:  08/19/2008  Job:  [id number]

## 2010-12-17 NOTE — Consult Note (Signed)
NAMESYRENA, BURGES NO.:  [id number]   MEDICAL RECORD NO.:  [id number]          PATIENT TYPE:  INP   LOCATION:  4705                         FACILITY:  [facility]   PHYSICIAN:  Marlan Palau, [name]  DATE OF BIRTH:  03/22/1956   DATE OF CONSULTATION:  08/15/2008  DATE OF DISCHARGE:                                 CONSULTATION   HISTORY OF PRESENT ILLNESS:  [name] is a 55 year old right-handed  black female born on 03/30/1956, with a history of hypertension and  obesity.  The patient has had a prior right pontine infarct in the past.  The patient comes in today with significantly elevated blood pressure in  the 218/95 range and notes onset of right mouth numbness that began  around 3:00 p.m. today, and the patient had associated double vision and  gait instability, tended to veer to the right when she tried to walk.  The patient denied any headache or problems with speech or swallowing.  The patient denied any numbness or weakness on arms or legs.  The  patient has disconjugate gaze upon presentation to the emergency room.  CT scan of the brain shows only the old right pontine stroke.  No acute  changes were seen.  Neurology was called for further evaluation.  NIH  Stroke Scale score is 2.   Past medical history is significant for:  1. 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  2. 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  3. Obesity.  4. Hypertension, under poor control.  5. Right knee surgery.  6. Tonsillectomy.  7. Degenerative arthritis.  8. Right carpal tunnel syndrome surgery in the past.  9. D and C in the past.   CURRENT MEDICATIONS:  1. Lisinopril 20 mg daily.  2. Clonidine 0.2 mg twice daily.  3. Aspirin 81 mg daily.  4. Mobic 7.5 mg daily.   ALLERGIES:  The patient has an allergy to DARVOCET, it causes  hallucinations.   SOCIAL HISTORY:  Does not smoke or drink.  This patient is separated,  lives in the [place], [place].  Has 3 children who are  alive and well.  The patient works as a Radio broadcast assistant.   Family medical history noted that father is alive, has hypertension,  diabetes.  Mother died with congestive heart failure, hypertension.  The  patient has 5 sisters living, one sister passed away with lung cancer; 8  brothers, one brother who is in a nursing home with stroke and diabetes.   Review of systems is notable for no recent fevers or chills.  The  patient denies headache, neck pain.  Does note frequent chest pains.  Denies shortness of breath.  Has had some nausea, vomiting today after  onset of symptoms and it has some abdominal pain.  Denies any problems  controlling the bowels or bladder.  Denies any blackout episodes or  seizure events.   PHYSICAL EXAMINATION:  VITAL SIGNS:  Blood pressure is 218/95, heart  rate 61, respiratory rate 20, temperature  afebrile.  GENERAL:  The patient is a markedly obese black female who is alert,  cooperative at the time of examination.  HEENT:  Head is atraumatic.  Eyes, pupils are equal, round, and reactive  to light.  Discs are flat bilaterally.  NECK:  Supple.  No carotid bruits noted.  RESPIRATORY:  Clear.  CARDIOVASCULAR:  Distant heart sounds.  No obvious murmurs or rubs  noted.  EXTREMITIES:  Without significant edema, although trace edema at the  ankles are noted bilaterally.  NEUROLOGIC:  Cranial nerves as above.  Facial symmetry is intact.  The  patient has evidence of incomplete abduction of the left eye with  rightward gaze, otherwise full extraocular movements noted.  On primary  gaze, the right eye may be slightly lower than the left.  There is an  unusual nystagmus pattern with rotational nystagmus on the left eye with  rotation to the left.  Rotation is present with horizontal nystagmus as  well with the right eye.  Fast phase of nystagmus to the right.  The  patient has symmetric pinprick  sensation on the face.  Has normal  speech.  Visual fields are full to double simultaneous stimulation.  Motor test reveals 5/5 strength in all 4s.  Good symmetric motor tone is  noted throughout.  No drift is seen in the arms or legs.  The patient  has good pinprick sensation of the arms, some slight decreased pinprick  sensation on the right foot as compared to the left.  Vibratory  sensation is intact on all 4s.  The patient has fair finger-nose-finger  and heel-to-shin bilaterally.  Deep tendon reflexes reveal questionable  increase in the left biceps reflex as compared to the right, otherwise  symmetric throughout.  Toes are neutral bilaterally.  The patient was  not ambulated.   NIH Stroke Scale score is 2.   Laboratory values are notable for a white count of 7.1, hemoglobin of  12.7, hematocrit of 40.3, MCV of 72.7, platelets of 253.  Sodium 139,  potassium 3.5, chloride of 102, CO2 27, glucose of 104, BUN of 12,  creatinine 0.87.  Total bili 0.4, alk phosphatase of 94, SGOT 23, SGPT  of 16, total protein 7.0, albumin of 3.6, calcium 9.0.  CK was 87, MB  fraction 1.0, troponin I less than 0.01.   CT of the head is as above.   IMPRESSION:  1. History of new onset of a left pontine infarct.  2. 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  3. Hypertension is severe.  4. Obesity.   This patient likely sustained a small vessel infarct involving the left  pons, has medial longitudinal fasciculus deficit on the left by clinical  examination.  The patient likely has severe hypertension that is the  main etiology for the stroke.  The patient will require admission for  further evaluation.  Blood pressure needs to be under better control.  The patient is not a TPA candidate to duration of symptoms.   PLAN:  1. MRI study of the brain.  2. MRI angiogram of the intracranial and extracranial vessels      recommended.  3. A 2D echocardiogram.  4. Consider Plavix therapy.  5. Physical therapy  for gait.  6. Blood pressure control.  7. We would recommend a urine drug screen.  8. Fasting lipid profile in a.m.  We will follow the patient's      clinical course while in-house.      Marlan Palau,  [name]  Electronically Signed     CKW/MEDQ  D:  08/15/2008  T:  08/16/2008  Job:  [id number]   cc:   [facility]  [facility] Dr. Yetta Barre

## 2010-12-17 NOTE — Discharge Summary (Signed)
[name], [name] NO.:  [id number]   MEDICAL RECORD NO.:  [id number]          PATIENT TYPE:  INP   LOCATION:  4711                         FACILITY:  [facility]   PHYSICIAN:  Paula Compton, MD        DATE OF BIRTH:  Dec 23, 1955   DATE OF ADMISSION:  03/01/2009  DATE OF DISCHARGE:  03/02/2009                               DISCHARGE SUMMARY   PRIMARY CARE PHYSICIAN:  Rodney Langton, MD at [facility].   REASON FOR ADMISSION:  Left upper tooth pain and chest pain.   PRIMARY DISCHARGE DIAGNOSES:  1. Dental caries/tooth infection.  2. Atypical chest pain.  3. Hypertension.  4. Hyperlipidemia.  5. History of cerebrovascular accident.  6. History of adjustment disorder.  7. Diabetes.   HOME MEDICATIONS CONTINUED AT THE TIME DISCHARGE:  1. Lisinopril 80 mg p.o. daily.  2. Clonidine 0.2 mg for clarification by mouth b.i.d.  3. Hydrochlorothiazide 25 mg p.o. daily.  4. Norvasc 10 mg p.o. daily.  5. Coreg 3.125 mg p.o. b.i.d.  6. Simvastatin 20 mg p.o. daily.  7. Plavix 75 mg p.o. daily.  8. Citalopram 10 mg p.o. daily.  9. Imdur 30 mg p.o. every morning.   NEW MEDICATIONS AT THE TIME OF DISCHARGE:  1. Augmentin 875 mg by mouth 2 times a day for 14 days.  2. Percocet 1-2 tabs by mouth every 6 hours as needed for pain.   PROBLEM:  1. Dental caries/tooth ache.  The patient came into the emergency      department for tooth ache.  While there, the patient reported chest      pain, which led to the patient's admission.  The patient was given      Augmentin 875 mg p.o. b.i.d. for 14 days.  The patient already has      appointment with dentist.  She is planning to follow up with      dentist to take care of the dental caries that are causing the      infection.  The patient was given Percocet 1-2 tabs by mouth every      6 hours as needed for pain.  For pain controlling pills, she goes      to dentist.  2. Chest pain.  Chest pain worked up.  Cardiac  enzymes negative.  EKG      showed no acute changes.  Chest x-ray was normal.  The patient      remained in normal sinus rhythm on telemetry.  I spoke with the      patient's cardiologist, Dr. Sharyn Lull, who states that the patient      can be followed up as an outpatient.  She is scheduled to see him      on March 05, 2009, at 9:30 a.m.  3. Hypertension.  The patient has not been complained with BP      medications.  On arrival to ED, had elevation of 193/96.  The      patient did have some elevated blood pressures during      hospitalization.  I  encouraged the patient to take all home      medication in order to control blood pressure.  The patient to      follow up with PCP, Dr. Benjamin Stain on March 13, 2009, at 11:30      a.m. to address this or any other issues if the patient would like      to discuss.   PERTINENT LABORATORY DATA:  Cardiac enzymes negative x3.  TSH 3.138.  Hemoglobin A1c was pending at the time of discharge.   FOLLOWUP ISSUES:  Hemoglobin A1c should be in the e-chart at the time of  the patient's followup appointment was pending at the time of discharge.  Need to follow up on the patient's hypertension to ensure that she is  not taking medications and blood pressure is under better control.  Also, need to follow up to ensure the patient with dental appointment to  take care of the problem that is causing the dental infections.  Followup appointment with Dr. Benjamin Stain on March 13, 2009 at 11:30  a.m.  Appointment with the patient's cardiologist, Dr. Sharyn Lull, March 05, 2009, at 9:30 a.m.      Ellin Mayhew, MD  Electronically Signed      Paula Compton, MD  Electronically Signed    DC/MEDQ  D:  03/02/2009  T:  03/03/2009  Job:  [id number]

## 2010-12-17 NOTE — H&P (Signed)
[name], [name]               ACCOUNT NO.:  [id number]   MEDICAL RECORD NO.:  [id number]          PATIENT TYPE:  INP   LOCATION:  4705                         FACILITY:  [facility]   PHYSICIAN:  Wayne A. Sheffield Slider, [name]    DATE OF BIRTH:  1955/09/23   DATE OF ADMISSION:  08/15/2008  DATE OF DISCHARGE:                              HISTORY & PHYSICAL   PRIMARY CARE PHYSICIAN:  Sylvan Cheese, MD of [facility].   HISTORY OF PRESENT ILLNESS:  A right facial droop and nystagmus.  The  patient started having symptoms of mouth numbness and tingling on the  right side of her mouth today around 1400.  She was nauseous, had  slurred speech and was stumbling around and noticed that she had a right-  sided facial droop with double vision originating on the right.  She has  had a stroke in the past and recognized the symptoms, so she came to the  ED.  She is accompanied by her daughters who also noticed her slurred  speech.  The patient claims to be taking all her blood pressure  medication and has recently been seen by Dr. Yetta Barre for blood pressure.  She has a history of hypertensive urgency with facial numbness in  October 2007.  MRI and MRA at that time found chronic microvascular  disease and a remote history of lacunar infarct.   ALLERGIES:  The patient has no known allergies.   PAST MEDICAL HISTORY:  Significant for hospitalization in October 2007,  with hypertensive urgency and face numbness thought secondary to small  vessel disease.  At that time, she had a 2-D echo that showed an  ejection fraction of 65% with mildly thickened left ventricular wall and  no wall motion abnormalities.  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  She has a hemoglobin A1c of 6.2 with a fasting lipid panel that shows  triglycerides of 112, HDLs of 42, and  LDLs of 80.   PAST SURGICAL HISTORY:  A tonsillectomy and adenoidectomy at age 50.  She has had a D&C for hydatidiform mole pregnancy in 1976.  She had  carpal tunnel release, and she had right knee meniscus repaired in March  2009 by Dr. Turner Daniels.   FAMILY HISTORY:  She has 15 brothers and sisters.  Some have  hypertension.  One sister died of lung cancer.  Her mother died of  congestive heart failure at 90.  Father is still alive, has diabetes and  hypertension.   SOCIAL HISTORY:  The patient denies alcohol, tobacco, and drug use.  The  patient lives with her grandson and daughter.   REVIEW OF SYSTEMS:  The patient denies abdominal pain, fever, shortness  of breath, or headache.  No constipation, diarrhea, or any other  symptoms unrelated to the immediate problem.  She has no difficulty  swallowing.   PHYSICAL EXAMINATION:  GENERAL:  The patient is well developed, well  nourished, in no acute distress.  HEAD:  She has normocephalic, atraumatic-appearing head with no obvious  abnormalities.  EYES:  The patient has a significant rotational nystagmus, especially  when looking to the right.  The left eye has more rotational nystagmus  than the right.  Vision is reported to be blurry but grossly intact.  EARS:  Appear normal on physical exam with clear canals and tympanic  membranes that are intact bilaterally without bulging or retraction.  NECK:  No deformities and no carotid bruits.  LUNGS:  She has normal respiratory effort.  Lungs are clear to  auscultation.  No crackles or wheezes.  HEART:  She has a normal rate and regular rhythm.  S1 and S2 are normal  without gallop, murmur, click, rub, or extra sounds.  ABDOMEN:  She has positive bowel sounds.  Her abdomen is soft, nontender  with no masses.  NEUROLOGIC:  She is intact.  Cranial nerves II through XII are intact.  She has normal strength in all extremities.  The only deformity noted on  her physical exam was rotational  nystagmus and the right eye droop.  SKIN:  Intact without lesions or rashes.  PSYCH:  She has good cognition and judgment and is awake and alert, now  able to tell her stories.   LABORATORY DATA:  She had CBC with a white blood cell count of 7.5,  hemoglobin 13.1, hematocrit 41.2, and platelets of 284.  Her cardiac  enzymes were negative x1.  UA showed cloudy urine with small leukocytes  but otherwise within normal limits.  A urine microscopy showed few  squamous cells, 3-6 white blood cells, and rare bacteria.  Sodium is  144, potassium is 3.8, chloride 105, bicarb 31, BUN 13, creatinine 0.99,  glucose was 131, and calcium was 9.6.  She had a T bili of 0.4 and alk  phos of 94.  AST of 23, ALT 16, total protein of 7, and albumin of 3.6.  She had a CT of the head that shows an old right pons lacunar infarct  and possible inferior right basal ganglia infarct, which are unchanged  from prior exam.  No acute intracranial abnormality are noted on this  exam so far.   Her vital signs at this time are temperature of 97.6, a pulse of 61, and  a blood pressure of 182/95.   ASSESSMENT AND PLAN:  This is a 55 year old African American female with  a history of hypertension who comes in with elevated blood pressures up  to 182/95 and a facial droop.   1. Neuro status.  The patient has a current right facial droop and had      right-sided facial numbness and tingling upon admission.  Neurology      has seen the patient and believe that it is due to her hypertension      causing a brain stem infarct.  We plan to complete a transient      ischemic attack/cerebrovascular accident workup including an      MRI/MRA and echo looking for embolic sources, swallow eval, and PT      eval.  Plan to get an MRI, but the patient states she has anxiety,      so she will need Ativan before she goes into the MRI.  We also plan      to start Plavix 75 mg.  The patient is already on aspirin 81 mg.  2. Hypertension.   The patient has a history of hypertension,  hypertensive urgency, and similar symptoms in the past.  The      patient claims to be compliant with her medications.  She is taking      lisinopril and hydrochlorothiazide.  Plan for this hospitalization      is to add Norvasc to achieve better blood pressure control along      with her other home medications.  We will titrate to achieve      control of blood pressure.  3. FEN/GI.  She is on a heart healthy diet after a swallow eval.  The      patient may eat.  4. Prophylaxis.  The patient is on aspirin and Plavix and will assess      if she needs further deep venous thrombosis prophylaxis.  5. Disposition.  When the patient is stable on her blood pressure      medications, the studies are done, and the patient is stable, we      will  consider discharge.  Her current medication list include Bayer Aspirin  81 mg 1 tablet p.o. daily, Mobic 15 mg 1 tablet p.o. q.a.m. as needed  for pain, lisinopril/hydrochlorothiazide combination 20-25 one tablet  p.o. daily for blood pressure, and clonidine 0.2 p.o. b.i.d. for blood  pressure.      Jamie Brookes, MD  Electronically Signed      Arnette Norris. Sheffield Slider, [name]  Electronically Signed    AS/MEDQ  D:  08/16/2008  T:  08/16/2008  Job:  [id number]

## 2010-12-20 ENCOUNTER — Telehealth: Payer: Self-pay | Admitting: *Deleted

## 2010-12-20 ENCOUNTER — Encounter: Payer: Self-pay | Admitting: *Deleted

## 2010-12-20 NOTE — Telephone Encounter (Addendum)
Received call from dental office requesting medical clearance for cleaning and filling . Patient states to them  This had been faxed already but they do not have . Dennison Nancy spoke with Dr. Benjamin Stain and he advises that patient needs to be off coumadin for 3 days prior to appointment for any cleaning, extraction or filling. Patient stopped coumadin yesterday . So she will need to reschedule appointment . Dental office notified. Letter faxed to dental office.

## 2010-12-20 NOTE — Consult Note (Signed)
[name], Carla NO.:  [id number]   MEDICAL RECORD NO.:  [id number]          PATIENT TYPE:  INP   LOCATION:  3315                         FACILITY:  [facility]   PHYSICIAN:  Marlan Palau, [name]  DATE OF BIRTH:  October 23, 1955   DATE OF CONSULTATION:  05/28/2006  DATE OF DISCHARGE:                                   CONSULTATION   HISTORY OF PRESENT ILLNESS:  [name] is a 55 year old right handed  black female born 1956-04-19 with a history of obesity and hypertension.  The patient comes to [facility] for onset of left-sided parietal  headache with numbness that began in the right arm yesterday morning and  progressed to involve the right lower right face.  The patient came to the  emergency room and was admitted for further evaluation.  An MRI scan of the  brain has been done and does not definitely show any acute changes.  I  question a minimal diffusion-weighted lesion in the left thalamus.  The  patient has an old cystic lacunar infarct in the right pons and some minimal  small vessel disease, otherwise.  Study is also showing some minimal  hemosiderin deposits, and a question of an amyloid angiopathy was  entertained.  Neurology was asked to see this patient for further  evaluation.  NIH stroke scale score at this time is 1.   PAST MEDICAL HISTORY:  Notable for:  1. New onset of right face and arm numbness.  2. Obesity.  3. Hypertension.  4. History of iron-deficiency anemia.  5. Degenerative arthritis  6. Degenerative disk disease, low back pain.  7. Right carpal tunnel syndrome surgery in past.  8. D&C in the past.  9. Tonsillectomy.   The patient has no known allergies.   Does not smoke.  Drinks three 40-ounce beers per week.   CURRENT MEDICATIONS INCLUDE:  1. Hydrochlorothiazide 25 mg daily.  2. Lopressor 50 mg b.i.d.  3. Labetalol 20 mg IV if needed.  4. Darvocet if needed.  5. Ativan 2 mg if needed.  6. Morphine if needed.   SOCIAL HISTORY:  This patient lives in the [place], [place], area.  Is separated.  Has three children who are alive and well.  The patient works  as a Scientist, clinical (histocompatibility and immunogenetics).   FAMILY MEDICAL HISTORY:  Notable that mother died with hypertension,  diabetes.  Father is alive, has history of diabetes and hypertension.  The  patient has one brother with a history of stroke, hypertension, diabetes.  Otherwise has 14 brothers and sisters that are in fairly good health except  for one sister who died with cancer.   REVIEW OF SYSTEMS:  Notable for no recent fevers, chills.  The patient does  note left parietal headache.  Denies any problems swallowing.  Denies any  visual field changes, blurred vision, double vision.  The patient has no  neck pain, shortness of breath. Does note some palpitations off and on.  Denies chest pain.  Denies any abdominal pain, nausea, vomiting.  Does have  some urinary urgency, occasional incontinence.  Denies any gait disturbance,  blackout episodes, seizure events.   PHYSICAL EXAMINATION:  VITALS:  Blood pressure is 162/80, heart rate 80,  respiratory 20, temperature afebrile.  GENERAL:  The patient is a moderately  to markedly obese black female who is alert and cooperative at time of  examination.  HEENT EXAMINATION:  Head is atraumatic.  Eyes:  Pupils are equal, round and  react to light.  Disks are flat bilaterally.  NECK:  Supple.  No carotid bruits noted.  RESPIRATORY:  Examination is clear.  CARDIOVASCULAR:  Reveals a regular rate and rhythm.  No obvious murmurs or  rubs noted.  EXTREMITIES:  Are without significant edema.  NEUROLOGICAL EXAMINATION:  Cranial nerves as above.  Facial symmetry is  present.  The patient has good sensation of face to pinprick on the  forehead, decreased on the right lower face as compared to the left.  Patient has again full extraocular movements.  Visual fields are full to  double simultaneous stimulation.  Speech is well  enunciated and not aphasic.  Motor testing reveals 5/5 strength in all fours, good symmetric motor tone  is noted throughout.  Sensory testing reveals some decreased pinprick  sensation in the right forearm and hand as compared to the left, more  symmetric in the legs.  Vibratory sensation is symmetric throughout.  The  patient has good finger-nose-finger, heel-to-shin.  Gait was not tested.  Deep tendon reflexes depressed but symmetric.  Toes neutral bilaterally.  No  drift is seen in the upper and lower extremities.   NIH stroke scale of 1.   LABORATORY VALUES:  Notable for a white count 6.9, hemoglobin of 11.7,  hematocrit 36.2, MCV of 71.7, platelets of 309.  Sodium of 141, potassium  3.3, chloride of 108, CO2 24, glucose of 96, BUN of 12, creatinine 0.9,  calcium 9.2.  Cholesterol level of 143, HDL of 43, LDL of 84, VLDL of 16,  triglycerides of 81.  Homocystine level is pending.   MRI scan of the head is as above.  Carotid Doppler studies have been done,  and preliminary report shows no evidence of significant internal carotid  artery stenosis.  Two-D echocardiogram has been ordered and is pending.   IMPRESSION:  1. History of new onset right face and arm numbness, likely secondary      small-vessel disease.  2. Small-vessel disease by MRI of the brain, lacunar infarct in the right      pons in the past.  3. Minimal changes by MRI of hemosiderin deposits associated with possible      amyloid angiopathy.  4. Hypertension.   This patient does have evidence of small-vessel disease.  Again, I question  a minimal diffusion-weighted positive lesion in the left thalamus.  It could  potentially result in some sensory alteration, but this is a questionable  change.  In any rate, I suspect this patient's symptoms are due to small-  vessel disease and would treat this accordingly.  The findings consistent  with amyloid angiopathy are quite minimal at this point.  PLAN:  1. Consider  use of Aggrenox coming out of the hospital.  2. Complete stroke workup with 2-D echocardiogram and homocystine level.  3. Treat the patient's headache symptomatically.  Will follow the      patient's clinic course while in-house.   MRI angiogram of intracranial vessels does show some degree of  atherosclerotic changes intracranially but no significant medium or large  vessel blockages are noted.  Marlan Palau, [name]  Electronically Signed     CKW/MEDQ  D:  05/28/2006  T:  05/29/2006  Job:  [id number]   cc:   Corinna L. Lendell Caprice, MD  [facility]

## 2010-12-20 NOTE — H&P (Signed)
NAMEJORY, [name]               ACCOUNT NO.:  [id number]   MEDICAL RECORD NO.:  [id number]          PATIENT TYPE:  INP   LOCATION:  3315                         FACILITY:  [facility]   PHYSICIAN:  Melissa L. Ladona Ridgel, MD  DATE OF BIRTH:  March 28, 1956   DATE OF ADMISSION:  05/27/2006  DATE OF DISCHARGE:                                HISTORY & PHYSICAL   CHIEF COMPLAINT:  Right arm numbness and right face numbness since 10  o'clock the day prior to admission.   PRIMARY CARE PHYSICIAN:  None.   HISTORY OF PRESENT ILLNESS:  The patient is a 55 year old African-American  female with past medical history for hypertension which has not been treated  for at least a month, obesity, the patient states this morning she woke up  feeling like she slept on her arm. Her arm felt numb but she went on about  her business and paid no attention to it. Later in the day her right face  became numb feeling like someone had given her Novocain.  Symptoms persisted  and she refused to come to the emergency room for further evaluation.  The  patient came to the emergency room to attend another friend and while here  mentioned about her face being numb and her family convinced her to stay.  In the emergency room the patient was found to be hypertensive in the high  200s with persistent symptomology.  She underwent CT scan of the head and  MRI of the head which was read as no acute stroke. 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  The patient was  started on IV Labetalol pushes and with some improvement in her blood  pressures but nitroglycerin was added prior to [facility] hospitalist being asked  to admit.  At the time of the exam, the patient has persistent symptoms and  remains hypertensive.  She was therefore admitted to the step down unit for  further blood pressure control.   REVIEW OF SYSTEMS:  No fever, no chills, no nausea, no vomiting. She does  get sweaty at night  for about the last year.  Her last menses was a year  ago, maybe had a small break through bleed in August but really feels that  she has gone through the change.  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   PAST MEDICAL HISTORY:  Hypertension, slipped disk, anemia, degenerative  joint disease.   PAST SURGICAL HISTORY:  She had tonsils removed and carpal tunnel surgery on  her right hand.  She also had D&C with a hydatidiform mole.   SOCIAL HISTORY:  She denies tobacco.  She does drink a 40 ounce beer daily,  really have never not done that.  She denies any seizure activity related to  not alcohol but it also sounds like she probably has not had alcohol.  She  denies illicit drugs.  She works as a Aeronautical engineer.  FAMILY HISTORY:  Mother deceased age 39 secondary to hypertension and  heart  disease.  Father is living with hypertension and diabetes.   ALLERGIES:  NO KNOWN DRUG ALLERGIES.   MEDICATIONS:  She was to be taking 80/20 of Micardis a day but has not had  any since last month.  She is on no other medications.   LABORATORY DATA:  Sodium 141, potassium 4.0, chloride 109, CO2 27, BUN 15,  creatinine 1.1, glucose is 97, CT scan as stated was negative for acute  event.  MRI showed an old right-sided pons lesion but chronic small vessel  disease but to date there is no acute event.   Chest reveals mild cardiomegaly with no acute changes.  EKG is normal sinus  rhythm with a heart rate 72 beats per minute and no ST-T wave changes.   ASSESSMENT AND PLAN:  The patient is a 50African-American female with right  arm numbness and right facial numbness.  The patient was found to be  hypertension with urgency and therefore we will admit her to the step down  unit for titration of medication.  1. Cardiovascular hypertensive urgency with negative CT and negative MRI.      She continues to  remain with symptoms onset of which are at least this      a.m. upon awaking.  I agree with starting her on nitro drip.  Will      continue her on Labetalol and consider Labetalol drip as necessary.      Will check 2-D echocardiogram and carotid ultrasound and once her blood      pressure is below 200 we will add aspirin for platelet agent but at      this time she is at risk for intracerebral bleed.  Anticoagulation      because of nature of her hypertension.  2. Pulmonary.  She has no complaints.  3. GI:  She has no complaints.  She is morbidly obese, however.  4. History of anemia.  Will check a CBC.  5. Left thumb injury.  Happened to her at work when one of her clients      pulled on her thumb.  Will assess her with x-ray and consider      intervention once a clear diagnosis is made.  6. GU:  She has no complaints of dysuria so urinalysis is not necessary at      this time.  7. Anticoagulation:  I need to get her blood pressure down before I start      anything.  We might consider Aggrenox but at this time I am going to      focus on her antihypertensive medications and start aspirin at least      once her blood pressure is less than 170.      Melissa L. Ladona Ridgel, MD  Electronically Signed     MLT/MEDQ  D:  05/28/2006  T:  05/28/2006  Job:  [id number]

## 2010-12-20 NOTE — Op Note (Signed)
NAMEKIRSTINA, [name]               ACCOUNT NO.:  [id number]   MEDICAL RECORD NO.:  [id number]          PATIENT TYPE:  AMB   LOCATION:  ENDO                         FACILITY:  [facility]   PHYSICIAN:  Danise Edge, [name]   DATE OF BIRTH:  1955-12-31   DATE OF PROCEDURE:  06/17/2005  DATE OF DISCHARGE:                                 OPERATIVE REPORT   PROCEDURE:  Diagnostic colonoscopy.   INDICATIONS FOR PROCEDURE:  [name] is a 55 year old female born  13-Jun-1956. [name] has iron deficiency anemia based on a hemoglobin  of 10.1 grams, serum ferritin 7 ng/mL, and a serum iron saturation of 7.7%.  [name] continues to have monthly ___________.   [name] tells me that she does crave starch and ice. She has  ___________.   [name] has viewed the colonoscopy education film in the office. I  discussed with her the complications associated with colonoscopy  ___________. [name] has signed the operative permit.   MEDICATION ALLERGIES:  None.   CURRENT MEDICATIONS:  Hydrochlorothiazide, oral iron.   PAST MEDICAL HISTORY:  Hypertension, tonsillectomy, D&C for ___________.   HABITS:  [name] does not smoke cigarettes and consumes beer in  moderation.   ENDOSCOPIST:  Danise Edge, [name]   PREMEDICATION:  Versed 10 mg, Demerol 90 mg.   DESCRIPTION OF PROCEDURE:  After obtaining informed consent, [name] was  placed in the left lateral decubitus position. I administered intravenous  Demerol and intravenous Versed to achieve conscious sedation for the  procedure. The patient's blood pressure, oxygen saturation and cardiac  rhythm were monitored throughout the procedure and documented in the medical  record.   Anal inspection and digital rectal exam were normal. The Olympus adjustable  pediatric colonoscope was introduced into the rectum and advanced to the  cecum. A normal appearing ileocecal valve and appendiceal orifice were  identified. Colonic  preparation for the exam today was excellent.   RECTUM:  Normal. Retroflexed view of the distal rectum normal.  SIGMOID COLON AND DESCENDING COLON:  Normal.  SPLENIC FLEXURE:  Normal.  TRANSVERSE COLON:  Normal.  HEPATIC FLEXURE:  Normal.  ASCENDING COLON:  Normal.   ASSESSMENT:  ___________.           ______________________________  Danise Edge, [name]     MJ/MEDQ  D:  06/17/2005  T:  06/17/2005  Job:  [id number]

## 2010-12-20 NOTE — Discharge Summary (Signed)
[name], [name]               ACCOUNT NO.:  [id number]   MEDICAL RECORD NO.:  [id number]          PATIENT TYPE:  INP   LOCATION:  3315                         FACILITY:  [facility]   PHYSICIAN:  Corinna L. Lendell Caprice, MDDATE OF BIRTH:  1956/04/19   DATE OF ADMISSION:  05/27/2006  DATE OF DISCHARGE:  05/30/2006                                 DISCHARGE SUMMARY   DISCHARGE DIAGNOSES:  1. Hypertensive urgency.  2. Right arm and face numbness felt to be secondary to small vessel      disease.  3. Obesity.  4  Hypothyroidism..  1. Medical noncompliance..  2. Hypokalemia.   DISCHARGE MEDICATIONS:  1. Aspirin 325 mg a day.  2. Hydrochlorothiazide 25 mg a day.  3. Synthroid 50 mcg a day.  4. Metoprolol 100 mg p.o. b.i.d.  5. Clonidine 0.2 mg p.o. b.i.d.  6. Norvasc 5 mg a day.   FOLLOWUP:  Follow up with [facility] first available appointment.   ACTIVITY:  Ad lib.   DIET:  Low-salt.   CONDITION ON DISCHARGE:  Stable.   CONSULTATIONS:  Marlan Palau, [name]   PROCEDURE:  None.   PERTINENT LABORATORY DATA:  The CBC was significant for hemoglobin of.  11.7, hematocrit 36 otherwise unremarkable.  Basic metabolic panel  significant for a potassium of 3.30.  Hemoglobin A1c of 6.  LDL 84, HDL 43,  triglycerides 81, total cholesterol 143.  TSH 8.663 .  Free T4-3 and free T3  panel pending.  Homocystine 9.   Special studies in radiology:  CT of the brain without contrast showed  chronic small vessel disease in the deep white matter, evidence of small old  infarct in the right side of the pons; nothing acute.  Chest x-ray showed  mild cardiomegaly.  MRI showed no acute stroke, chronic microvascular  ischemic changes and remote lacunar infarctions, atrophy, scattered sub  centimeter foci of chronic hemorrhage throughout the brain, and deep nuclei  suggesting cerebral amyloid disease.  MRA showed widely patent carotid and  basilar arteries, codominant vertebral arteries.  Distal MCA  and PCA  branches showed mild irregularity consistent with intracranial  atherosclerotic change.  Carotid Doppler showed no significant stenosis.   Echocardiogram showed ejection fraction of 60-65% , mildly to moderately  increased left ventricular wall thickness, mildly dilated left atrium.  EKG  showed normal sinus rhythm.   HISTORY AND HOSPITAL COURSE:  Ms. Bittman is a 55 year old unassigned black  female who has a history of hypertension but had stopped taking her  medication because she lost her insurance.  She presented with a headache,  right facial numbness and hand numbness.  CAT scan showed no acute stroke.  Her blood pressure was in the high 200s.  She had a some decreased pinprick  sensation in the right forearm and hand compared to left, but otherwise  nonfocal exam.   She was admitted to the step-down unit and started on nitroglycerin drip.  Neurology was consulted; they were not very impressed with the MRI findings  of amyloid. They felt that her symptoms were due to chronic to small vessel  disease and recommended an aspirin a day and blood pressure control.  She  was transitioned to oral medications and at the time of discharge her  systolic blood pressure was about 150.  We have arranged indigent  medications and I recommended that she follow up with at [facility] first  available appointment.      Corinna L. Lendell Caprice, MD  Electronically Signed     CLS/MEDQ  D:  05/30/2006  T:  06/01/2006  Job:  [id number]

## 2011-01-07 ENCOUNTER — Ambulatory Visit (INDEPENDENT_AMBULATORY_CARE_PROVIDER_SITE_OTHER): Payer: Medicaid Other | Admitting: *Deleted

## 2011-01-07 DIAGNOSIS — I4891 Unspecified atrial fibrillation: Secondary | ICD-10-CM

## 2011-01-07 DIAGNOSIS — Z7901 Long term (current) use of anticoagulants: Secondary | ICD-10-CM

## 2011-01-07 LAB — POCT INR: INR: 1.5

## 2011-01-14 ENCOUNTER — Encounter: Payer: Self-pay | Admitting: Sports Medicine

## 2011-01-14 ENCOUNTER — Ambulatory Visit: Payer: Medicaid Other | Admitting: Sports Medicine

## 2011-01-20 ENCOUNTER — Ambulatory Visit: Payer: Medicaid Other

## 2011-01-22 ENCOUNTER — Ambulatory Visit (INDEPENDENT_AMBULATORY_CARE_PROVIDER_SITE_OTHER): Payer: Medicaid Other | Admitting: *Deleted

## 2011-01-22 DIAGNOSIS — Z7901 Long term (current) use of anticoagulants: Secondary | ICD-10-CM

## 2011-01-22 DIAGNOSIS — I4891 Unspecified atrial fibrillation: Secondary | ICD-10-CM

## 2011-01-22 LAB — POCT INR: INR: 2.9

## 2011-02-03 ENCOUNTER — Ambulatory Visit: Payer: Medicaid Other | Admitting: Family Medicine

## 2011-02-12 ENCOUNTER — Ambulatory Visit: Payer: Medicaid Other

## 2011-02-23 ENCOUNTER — Emergency Department (HOSPITAL_COMMUNITY): Payer: Medicaid Other

## 2011-02-23 ENCOUNTER — Emergency Department (HOSPITAL_COMMUNITY)
Admission: EM | Admit: 2011-02-23 | Discharge: 2011-02-24 | Disposition: A | Discharge status: Home / Self Care | Payer: Medicaid Other | Attending: Emergency Medicine | Admitting: Emergency Medicine

## 2011-02-23 DIAGNOSIS — E669 Obesity, unspecified: Secondary | ICD-10-CM | POA: Insufficient documentation

## 2011-02-23 DIAGNOSIS — Z8679 Personal history of other diseases of the circulatory system: Secondary | ICD-10-CM | POA: Insufficient documentation

## 2011-02-23 DIAGNOSIS — R509 Fever, unspecified: Secondary | ICD-10-CM | POA: Insufficient documentation

## 2011-02-23 DIAGNOSIS — J3489 Other specified disorders of nose and nasal sinuses: Secondary | ICD-10-CM | POA: Insufficient documentation

## 2011-02-23 DIAGNOSIS — R197 Diarrhea, unspecified: Secondary | ICD-10-CM | POA: Insufficient documentation

## 2011-02-23 DIAGNOSIS — I10 Essential (primary) hypertension: Secondary | ICD-10-CM | POA: Insufficient documentation

## 2011-02-23 DIAGNOSIS — R51 Headache: Secondary | ICD-10-CM | POA: Insufficient documentation

## 2011-02-23 DIAGNOSIS — R110 Nausea: Secondary | ICD-10-CM | POA: Insufficient documentation

## 2011-02-23 LAB — COMPREHENSIVE METABOLIC PANEL
ALT: 21 U/L (ref 0–35)
AST: 18 U/L (ref 0–37)
Albumin: 3.7 g/dL (ref 3.5–5.2)
Alkaline Phosphatase: 85 U/L (ref 39–117)
BUN: 11 mg/dL (ref 6–23)
Calcium: 9.1 mg/dL (ref 8.4–10.5)
Potassium: 3.6 mEq/L (ref 3.5–5.1)
Sodium: 142 mEq/L (ref 135–145)
Total Bilirubin: 0.1 mg/dL — ABNORMAL LOW (ref 0.3–1.2)
Total Protein: 7.7 g/dL (ref 6.0–8.3)

## 2011-02-23 LAB — DIFFERENTIAL
Basophils Absolute: 0 10*3/uL (ref 0.0–0.1)
Basophils Relative: 0 % (ref 0–1)
Eosinophils Absolute: 0.1 10*3/uL (ref 0.0–0.7)
Eosinophils Relative: 1 % (ref 0–5)
Monocytes Absolute: 0.5 10*3/uL (ref 0.1–1.0)
Neutro Abs: 5.3 10*3/uL (ref 1.7–7.7)
Neutrophils Relative %: 59 % (ref 43–77)

## 2011-02-23 LAB — CBC
HCT: 37.2 % (ref 36.0–46.0)
Hemoglobin: 12.4 g/dL (ref 12.0–15.0)
MCH: 22.7 pg — ABNORMAL LOW (ref 26.0–34.0)
MCHC: 33.3 g/dL (ref 30.0–36.0)
MCV: 68 fL — ABNORMAL LOW (ref 78.0–100.0)
Platelets: 347 10*3/uL (ref 150–400)
RDW: 16.8 % — ABNORMAL HIGH (ref 11.5–15.5)

## 2011-02-23 LAB — GLUCOSE, CAPILLARY: Glucose-Capillary: 226 mg/dL — ABNORMAL HIGH (ref 70–99)

## 2011-02-23 LAB — PROTIME-INR: INR: 2.89 — ABNORMAL HIGH (ref 0.00–1.49)

## 2011-03-25 ENCOUNTER — Observation Stay (HOSPITAL_COMMUNITY)
Admission: EM | Admit: 2011-03-25 | Discharge: 2011-03-29 | Disposition: A | Discharge status: Home / Self Care | Payer: Medicaid Other | Attending: Cardiology | Admitting: Cardiology

## 2011-03-25 ENCOUNTER — Emergency Department (HOSPITAL_COMMUNITY): Payer: Medicaid Other

## 2011-03-25 DIAGNOSIS — I209 Angina pectoris, unspecified: Principal | ICD-10-CM | POA: Insufficient documentation

## 2011-03-25 DIAGNOSIS — E785 Hyperlipidemia, unspecified: Secondary | ICD-10-CM | POA: Insufficient documentation

## 2011-03-25 DIAGNOSIS — Z8673 Personal history of transient ischemic attack (TIA), and cerebral infarction without residual deficits: Secondary | ICD-10-CM | POA: Insufficient documentation

## 2011-03-25 DIAGNOSIS — Z7901 Long term (current) use of anticoagulants: Secondary | ICD-10-CM | POA: Insufficient documentation

## 2011-03-25 DIAGNOSIS — E7800 Pure hypercholesterolemia, unspecified: Secondary | ICD-10-CM | POA: Insufficient documentation

## 2011-03-25 DIAGNOSIS — I10 Essential (primary) hypertension: Secondary | ICD-10-CM | POA: Insufficient documentation

## 2011-03-25 DIAGNOSIS — E119 Type 2 diabetes mellitus without complications: Secondary | ICD-10-CM | POA: Insufficient documentation

## 2011-03-25 DIAGNOSIS — R9439 Abnormal result of other cardiovascular function study: Secondary | ICD-10-CM | POA: Insufficient documentation

## 2011-03-25 LAB — CK TOTAL AND CKMB (NOT AT ARMC)
Relative Index: INVALID (ref 0.0–2.5)
Total CK: 99 U/L (ref 7–177)

## 2011-03-25 LAB — POCT I-STAT TROPONIN I: Troponin i, poc: 0 ng/mL (ref 0.00–0.08)

## 2011-03-25 LAB — PROTIME-INR
INR: 2.09 — ABNORMAL HIGH (ref 0.00–1.49)
Prothrombin Time: 23.8 seconds — ABNORMAL HIGH (ref 11.6–15.2)

## 2011-03-25 LAB — COMPREHENSIVE METABOLIC PANEL
ALT: 27 U/L (ref 0–35)
AST: 23 U/L (ref 0–37)
Albumin: 3.6 g/dL (ref 3.5–5.2)
CO2: 25 mEq/L (ref 19–32)
Calcium: 9.4 mg/dL (ref 8.4–10.5)
Chloride: 108 mEq/L (ref 96–112)
Creatinine, Ser: 0.84 mg/dL (ref 0.50–1.10)
Sodium: 142 mEq/L (ref 135–145)
Total Protein: 7.5 g/dL (ref 6.0–8.3)

## 2011-03-25 LAB — CBC
HCT: 37.1 % (ref 36.0–46.0)
Hemoglobin: 11.9 g/dL — ABNORMAL LOW (ref 12.0–15.0)
MCH: 22.5 pg — ABNORMAL LOW (ref 26.0–34.0)
MCH: 22.1 pg — ABNORMAL LOW (ref 26.0–34.0)
MCHC: 32.6 g/dL (ref 30.0–36.0)
MCHC: 32.1 g/dL (ref 30.0–36.0)
MCV: 68.9 fL — ABNORMAL LOW (ref 78.0–100.0)
MCV: 69 fL — ABNORMAL LOW (ref 78.0–100.0)
Platelets: 294 10*3/uL (ref 150–400)
Platelets: 321 10*3/uL (ref 150–400)
RBC: 5.34 MIL/uL — ABNORMAL HIGH (ref 3.87–5.11)
RBC: 5.38 MIL/uL — ABNORMAL HIGH (ref 3.87–5.11)
RDW: 17.1 % — ABNORMAL HIGH (ref 11.5–15.5)
WBC: 8.2 10*3/uL (ref 4.0–10.5)
WBC: 7.9 10*3/uL (ref 4.0–10.5)

## 2011-03-25 LAB — TROPONIN I: Troponin I: <0.3 ng/mL (ref <0.30)

## 2011-03-25 LAB — DIFFERENTIAL
Basophils Relative: 0 % (ref 0–1)
Eosinophils Relative: 1 % (ref 0–5)
Lymphocytes Relative: 43 % (ref 12–46)
Lymphs Abs: 3.4 10*3/uL (ref 0.7–4.0)
Monocytes Absolute: 0.4 10*3/uL (ref 0.1–1.0)
Monocytes Relative: 5 % (ref 3–12)
Neutro Abs: 4 10*3/uL (ref 1.7–7.7)

## 2011-03-25 LAB — TSH: TSH: 2.602 u[IU]/mL (ref 0.350–4.500)

## 2011-03-25 LAB — POCT I-STAT, CHEM 8
BUN: 15 mg/dL (ref 6–23)
Chloride: 108 mEq/L (ref 96–112)
Creatinine, Ser: 0.9 mg/dL (ref 0.50–1.10)
Hemoglobin: 13.3 g/dL (ref 12.0–15.0)
Potassium: 4.1 mEq/L (ref 3.5–5.1)
Sodium: 143 mEq/L (ref 135–145)
TCO2: 24 mmol/L (ref 0–100)

## 2011-03-25 LAB — GLUCOSE, CAPILLARY: Glucose-Capillary: 118 mg/dL — ABNORMAL HIGH (ref 70–99)

## 2011-03-26 ENCOUNTER — Inpatient Hospital Stay (HOSPITAL_COMMUNITY): Payer: Medicaid Other

## 2011-03-26 LAB — CBC
Hemoglobin: 11.2 g/dL — ABNORMAL LOW (ref 12.0–15.0)
MCH: 22.3 pg — ABNORMAL LOW (ref 26.0–34.0)
MCV: 68.9 fL — ABNORMAL LOW (ref 78.0–100.0)
Platelets: 314 10*3/uL (ref 150–400)
RBC: 5.02 MIL/uL (ref 3.87–5.11)
WBC: 8.3 10*3/uL (ref 4.0–10.5)

## 2011-03-26 LAB — BASIC METABOLIC PANEL
CO2: 26 mEq/L (ref 19–32)
Calcium: 9.3 mg/dL (ref 8.4–10.5)
Chloride: 106 mEq/L (ref 96–112)
Creatinine, Ser: 0.8 mg/dL (ref 0.50–1.10)
GFR calc Af Amer: >60 mL/min (ref >60)
Glucose, Bld: 122 mg/dL — ABNORMAL HIGH (ref 70–99)
Potassium: 3.8 mEq/L (ref 3.5–5.1)

## 2011-03-26 LAB — LIPID PANEL
LDL Cholesterol: 67 mg/dL (ref 0–99)
Triglycerides: 132 mg/dL (ref <150)
VLDL: 26 mg/dL (ref 0–40)

## 2011-03-26 LAB — GLUCOSE, CAPILLARY
Glucose-Capillary: 113 mg/dL — ABNORMAL HIGH (ref 70–99)
Glucose-Capillary: 151 mg/dL — ABNORMAL HIGH (ref 70–99)
Glucose-Capillary: 156 mg/dL — ABNORMAL HIGH (ref 70–99)
Glucose-Capillary: 111 mg/dL — ABNORMAL HIGH (ref 70–99)

## 2011-03-26 LAB — CARDIAC PANEL(CRET KIN+CKTOT+MB+TROPI)
CK, MB: 1.4 ng/mL (ref 0.3–4.0)
CK, MB: 1.6 ng/mL (ref 0.3–4.0)
Relative Index: INVALID (ref 0.0–2.5)
Troponin I: <0.3 ng/mL (ref <0.30)
Troponin I: <0.3 ng/mL (ref <0.30)

## 2011-03-27 LAB — GLUCOSE, CAPILLARY
Glucose-Capillary: 130 mg/dL — ABNORMAL HIGH (ref 70–99)
Glucose-Capillary: 133 mg/dL — ABNORMAL HIGH (ref 70–99)

## 2011-03-27 LAB — PROTIME-INR
INR: 2.76 — ABNORMAL HIGH (ref 0.00–1.49)
Prothrombin Time: 29.6 seconds — ABNORMAL HIGH (ref 11.6–15.2)

## 2011-03-27 MED ORDER — TECHNETIUM TC 99M TETROFOSMIN IV KIT
10.0000 | PACK | Freq: Once | INTRAVENOUS | Status: AC | PRN
  Administered 2011-03-27: 10 via INTRAVENOUS

## 2011-03-27 MED ORDER — TECHNETIUM TC 99M TETROFOSMIN IV KIT
30.0000 | PACK | Freq: Once | INTRAVENOUS | Status: AC | PRN
  Administered 2011-03-27: 30 via INTRAVENOUS

## 2011-03-28 LAB — GLUCOSE, CAPILLARY: Glucose-Capillary: 136 mg/dL — ABNORMAL HIGH (ref 70–99)

## 2011-03-28 LAB — CARDIAC PANEL(CRET KIN+CKTOT+MB+TROPI)
CK, MB: 1.6 ng/mL (ref 0.3–4.0)
Relative Index: INVALID (ref 0.0–2.5)
Total CK: 81 U/L (ref 7–177)
Troponin I: <0.3 ng/mL (ref <0.30)

## 2011-03-28 LAB — PROTIME-INR: INR: 2.65 — ABNORMAL HIGH (ref 0.00–1.49)

## 2011-03-29 LAB — GLUCOSE, CAPILLARY
Glucose-Capillary: 104 mg/dL — ABNORMAL HIGH (ref 70–99)
Glucose-Capillary: 159 mg/dL — ABNORMAL HIGH (ref 70–99)

## 2011-04-16 NOTE — Discharge Summary (Signed)
NAMEWALTER, [name] NO.:  [id number]  MEDICAL RECORD NO.:  [id number]  LOCATION:  2028                         FACILITY:  [facility]  PHYSICIAN:  Eduardo Osier. Sharyn Lull, [name] DATE OF BIRTH:  1956/02/04  DATE OF ADMISSION:  03/25/2011 DATE OF DISCHARGE:  03/29/2011                              DISCHARGE SUMMARY   ADMITTING DIAGNOSES: 1. Chest pain, rule out myocardial infarction. 2. Hypertension. 3. Non-insulin-dependent diabetes mellitus. 4. Hypercholesteremia. 5. Morbid obesity. 6. History of alcohol abuse. 7. History of lacunar infarct in the past. 8. History of paroxysmal atrial fibrillation in the past. 9. History of deep vein thrombosis in the past.  DISCHARGE DIAGNOSES: 1. Stable angina. 2. Mildly positive Lexiscan Myoview. 3. Hypertension. 4. Non-insulin-dependent diabetes mellitus. 5. Hypercholesteremia. 6. Morbid obesity. 7. History of alcohol abuse in the past. 8. History of lacunar infarct in the past. 9. History of paroxysmal atrial fibrillation in the past. 10.History of deep vein thrombosis in the past.  DISCHARGE HOME MEDICATIONS: 1. Amlodipine 5 mg 1 tablet daily. 2. Enteric-coated aspirin 81 mg 1 tablet daily. 3. Clopidogrel 75 mg 1 tablet daily. 4. Nitrostat sublingual 0.4 mg use as directed. 5. Crestor 10 mg 1 tablet daily. 6. Toprol-XL 50 mg 1 tablet daily. 7. Citalopram 40 mg 1 tablet daily as before. 8. Glipizide XL 2.5 mg 1 tablet daily before breakfast as before. 9. Lisinopril and hydrochlorothiazide 20/25 one tablet daily as     before. 10.Metformin 1000 mg 1 tablet in the morning and 1/2 tablet in the     evening as before.  DIET:  Low-salt, low-cholesterol, 1800 calories ADA diet.  The patient has been advised to monitor her blood pressure and blood sugar daily. Follow up with me in 1 week.  CONDITION AT DISCHARGE:  Stable.  The patient has been advised to stop her warfarin.  BRIEF HISTORY AND HOSPITAL COURSE:  [name] is a 55 year old black female with past medical history significant for hypertension, history of CVA, hypercholesteremia, morbid obesity, history of alcohol abuse in the past, and non-insulin-dependent diabetes mellitus who complains of retrosternal chest pressure, localized, 8/10 without associated nausea, vomiting, or diaphoresis.  Denies palpitation, lightheadedness, or syncope.  Denies PND or orthopnea, but complains of occasional leg swelling.  Denies cough, fever, or chills.  Denies any urinary complaints.  Denies any weakness.  Denies slurred speech.  Denies claudication pain in the legs.  The patient had stress test approximately 2 years ago, which was negative.  The patient received 2 sublingual nitro with partial relief of chest pain in the ED.  The patient does give history of exertional dyspnea with minimal exertion, although states her activity is limited.  PAST MEDICAL HISTORY:  As above, also history of paroxysmal AFib in the past.  PAST MEDICAL HISTORY:  She had right knee surgery in the past.  SOCIAL HISTORY:  She is divorced, 4 children, on disability.  Used to drink one pint of vodka and beer per week for 5 plus years, quit 7 years ago.  No history of tobacco abuse.  She worked as Technical sales engineer in the past.  FAMILY HISTORY:  Father is alive, he is 65, he is  hypertensive, diabetic.  Mother passed away at age of 47, she had massive MI.  ALLERGIES:  She is allergic to DARVOCET.  MEDICATION AT HOME:  She was on lisinopril HCT, Imdur, Norvasc, Plavix, clonidine, simvastatin, citalopram, and warfarin.  PHYSICAL EXAMINATION:  GENERAL:  She is alert, awake, and oriented x3, in no acute distress. VITAL SIGNS:  Blood pressure was 146/83.  Pulse was 72 and regular. HEENT:  Conjunctivae were pink. NECK:  Supple.  No JVD, no bruit. LUNGS:  Clear to auscultation without rhonchi or rales. CARDIOVASCULAR:  Soft, nontender, and nondistended were normal.  There was  soft systolic murmur. ABDOMEN:  Soft, bowel sounds present, obese, nontender. EXTREMITIES:  There is no clubbing, cyanosis, or edema.  LABORATORY DATA:  Hemoglobin was 12, hematocrit 36.8, and white count of 8.2.  Sodium was 142, potassium 4.1, BUN 13, creatinine 0.84, glucose was 119.  PT was 23.8, INR of 2.09.  Her 2 sets of cardiac enzymes were normal.  TSH was 2.60.  Hemoglobin A1c was 7.0.  her cholesterol was 133, HDL 40, and LDL was 67.  Her PT was 29.6 and INR of 2.76 on March 27, 2011.  On March 28, 2011, PT was 28.7 and INR of 2.65.  Her cardiac enzymes were negative.  Chest x-ray showed stable cardiomegaly, no active lung disease.  The patient underwent Lexiscan Myoview 2-day protocol on March 27, 2011, which showed mild apical and inferoapical wall ischemia with EF of 61%.  BRIEF HOSPITAL COURSE:  The patient was admitted to telemetry unit.  MI was ruled out by serial enzymes and EKG.  The patient did have once vague chest pain without any associated symptoms.  The patient subsequently underwent Lexiscan Myoview with 2-day protocol which showed mild apical and inferoapical wall ischemia with EF of 61%.  The patient is on chronic Coumadin with INR above 2.5.  I discussed at length with the patient regarding mildly abnormal Persantine Myoview and various options of treatment, i.e., keeping the patient in the hospital for few days still her Coumadin wears off and consider catheterization versus medical management versus outpatient cath, its risks and benefits.  The patient agreed for outpatient cath and wanted to be discharged home. The patient has been ambulating in the hallway without any problems. Her Coumadin has been discontinued.  The patient will be started on Plavix as outpatient and will be discharged home on above medications and will be followed up in my office early next week and will schedule her for cardiac with possible angioplasty as outpatient.  The  patient has been advised to call 911 if she gets recurrent chest pain.     Eduardo Osier. Sharyn Lull, [name]     MNH/MEDQ  D:  03/29/2011  T:  03/29/2011  Job:  [id number]  Electronically Signed by Rinaldo Cloud [name] on 04/16/2011 09:37:27 PM

## 2011-04-25 LAB — URINALYSIS, ROUTINE W REFLEX MICROSCOPIC
Glucose, UA: NEGATIVE
Ketones, ur: NEGATIVE
Protein, ur: NEGATIVE
Specific Gravity, Urine: 1.015
Urobilinogen, UA: 1

## 2011-04-25 LAB — I-STAT 8, (EC8 V) (CONVERTED LAB)
Acid-Base Excess: 3 — ABNORMAL HIGH
HCT: 47 — ABNORMAL HIGH
Hemoglobin: 16 — ABNORMAL HIGH
Operator id: 146091
Potassium: 3.3 — ABNORMAL LOW
Sodium: 140
TCO2: 31

## 2011-04-25 LAB — URINE MICROSCOPIC-ADD ON

## 2011-04-25 LAB — POCT I-STAT CREATININE
Creatinine, Ser: 1
Operator id: 146091

## 2011-04-28 ENCOUNTER — Encounter: Payer: Self-pay | Admitting: Family Medicine

## 2011-04-28 ENCOUNTER — Ambulatory Visit (INDEPENDENT_AMBULATORY_CARE_PROVIDER_SITE_OTHER): Payer: Medicaid Other | Admitting: Family Medicine

## 2011-04-28 VITALS — BP 115/78 | HR 74 | Temp 98.3°F | Ht 62.0 in | Wt 275.0 lb

## 2011-04-28 DIAGNOSIS — E119 Type 2 diabetes mellitus without complications: Secondary | ICD-10-CM

## 2011-04-28 DIAGNOSIS — R232 Flushing: Secondary | ICD-10-CM | POA: Insufficient documentation

## 2011-04-28 DIAGNOSIS — I4891 Unspecified atrial fibrillation: Secondary | ICD-10-CM

## 2011-04-28 DIAGNOSIS — I10 Essential (primary) hypertension: Secondary | ICD-10-CM

## 2011-04-28 DIAGNOSIS — N951 Menopausal and female climacteric states: Secondary | ICD-10-CM

## 2011-04-28 LAB — COMPREHENSIVE METABOLIC PANEL
ALT: 22 U/L (ref 0–35)
Alkaline Phosphatase: 65 U/L (ref 39–117)
Calcium: 9.4 mg/dL (ref 8.4–10.5)
Chloride: 100 mEq/L (ref 96–112)
Creat: 1.23 mg/dL — ABNORMAL HIGH (ref 0.50–1.10)
Glucose, Bld: 110 mg/dL — ABNORMAL HIGH (ref 70–99)
Sodium: 141 mEq/L (ref 135–145)
Total Bilirubin: 0.3 mg/dL (ref 0.3–1.2)
Total Protein: 7.4 g/dL (ref 6.0–8.3)

## 2011-04-28 LAB — BASIC METABOLIC PANEL
BUN: 13
Calcium: 9.9
Creatinine, Ser: 0.96
GFR calc non Af Amer: >60
Sodium: 140

## 2011-04-28 LAB — POCT GLYCOSYLATED HEMOGLOBIN (HGB A1C): Hemoglobin A1C: 7.1

## 2011-04-28 LAB — POCT HEMOGLOBIN-HEMACUE: Hemoglobin: 13.4

## 2011-04-28 MED ORDER — GABAPENTIN 300 MG PO CAPS: 300.0000 mg | ORAL_CAPSULE | Freq: Every day | ORAL | Status: DC

## 2011-04-28 NOTE — Assessment & Plan Note (Signed)
Unknown etiology - may be secondary to post-menopause. Will check TSH today. Will start Gabapentin 300 daily at bedtime. Follow up in 3 months.

## 2011-04-28 NOTE — Assessment & Plan Note (Signed)
a1c today: 7.1 Will continue current regimen: glipizide 2.5 daily and Metformin 1000 BID. Encouraged CHO-modified diet and daily exercise. Will repeat/follow-up in 3 months.

## 2011-04-28 NOTE — Patient Instructions (Signed)
Please schedule follow up appointment with me in 3 months. It was nice to meet you.  Menopause Menopause is the normal time of life when menstrual periods stop completely. Menopause is complete when you have missed 12 consecutive menstrual periods. It usually occurs between the ages of 60-55, with an average age of 64. Very rarely does a woman develop menopause before 56 years old. At menopause, your ovaries stop producing the female hormones, estrogen and progesterone. This can cause undesirable symptoms and also affect your health. Sometimes the symptoms may occur 4-5 years before the menopause begins. There is no relationship between oral contraceptives, number of children you had, race or the age your menstrual periods started (menarche). Heavy smokers and very thin women may develop menopause earlier in life. CAUSE  The ovaries stop producing the female hormones estrogen and progesterone.   Other causes include:   Surgery to remove both ovaries.  The ovaries stop functioning for no known reason.   Tumors of the pituitary gland in the brain.   Medical disease that affects the ovaries and hormone production.  Radiation treatment to the abdomen or pelvis.   Chemotherapy that affects the ovaries.   SYMPTOMS  Hot flashes.  Night sweats.   Decrease in sex drive.   Vaginal dryness and shrinking of the size of the genital organs causing painful intercourse.   Dryness of the skin and developing wrinkles.  Headaches.   Tiredness.   Irritability.   Memory problems.  Weight gain.   Bladder infections.   Hair growth of the face and chest.   Infertility.   More serious symptoms include:  Loss of bone (osteoporosis) causing breaks (fractures).   Depression.   Hardening and narrowing of the arteries (atherosclerosis) causing heart attacks and strokes.  DIAGNOSIS  When the menstrual periods have stopped for 12 straight months.   Physical exam.   Hormone studies of the  blood.  TREATMENT There are many treatment choices and nearly as many questions about them. The decisions to treat or not to treat menopausal changes is an individual decision made with your caregiver. Your caregiver can discuss the treatments with you. Together, you can decide which treatment will work best for you such as:  Hormone replacement treatment.  Treat the individual symptoms with medication (ex. tranquilizer for depression).   Herbal medications that may help specific symptoms.  Counseling by a psychiatrist or psychologist.   Group therapy.   No treatment.   HOME CARE INSTRUCTIONS  Take the medication your caregiver gives you as directed.   Get plenty of sleep and rest.   Exercise regularly.   Eat a diet that contains calcium (good for the bones) and soy products (acts like estrogen hormone).   Avoid alcoholic beverages.   Do not smoke.   Taking vitamin E may help in certain cases.   If you have hot flashes, dress in layers.   Take supplements, calcium and vitamin D to strengthen bones.   You can use over-the-counter vaginal cream for vaginal dryness.   Group therapy is sometimes very helpful.   Acupuncture may be helpful in some cases.  SEEK MEDICAL CARE IF:  You are not sure you are in the menopause.   You are having menopausal symptoms and need advice and treatment.   You are still having menstrual periods after age 25.   You have pain with intercourse.   You are in the menopause (no menstrual period for 12 months) and develop vaginal bleeding.   You  need a referral to a specialist (gynecologist, psychiatrist or psychologist) for treatment.  SEEK IMMEDIATE MEDICAL CARE IF:  You have severe depression.   You have excessive vaginal bleeding.   You fell and think you have a broken bone.   You have pain when you urinate.   You develop leg or chest pain.   You have a fast pounding heart beat (palpitations).   You have severe headaches.    You develop vision problems.   You feel a lump in your breast.   You have abdominal pain or severe indigestion.  Document Released: 10/11/2003 Document Re-Released: 05/23/2008 [facility] Patient Information 2011 [place], [place].

## 2011-04-28 NOTE — Progress Notes (Signed)
  Subjective:    Patient ID: [name], female    DOB: 1956/05/15, 55 y.o.   MRN: [id number]  HPI  Patient presents to clinic to meet new doctor and discuss atrial fib and hot flashes:  Atrial fib with RVR: patient was hospitalized in August for atrial fib; was seen by Dr. Sharyn Lull.  At that time, he stopped coumadin and started Plavix and Aspirin.  Patient has follow up with cardiologist in 2 months.  Patient denies tachycardia, palpitations, fatigue, dysnpnea, chest pain.  She continues to take Metoprolol XL.  Patient currently has an aid who comes to her house for 2 hrs/day, but patient says she needs more assistance with ADLs and is requesting referral for Home Health.  Hot flashes/menopause: patient has not had menstrual cycle in 3 years, however continues to have hot flashes.  She currently takes Citalopram 40 but this does not control hot flashes.  She uses fans at home, cold compresses, but would like to try something else.  This is a chronic issue, but hot flashes have become worse in the last year.    Review of Systems  Per HPI    Objective:   Physical Exam  Constitutional: No distress.  HENT:  Mouth/Throat: Oropharynx is clear and moist.  Eyes: EOM are normal. Pupils are equal, round, and reactive to light.  Cardiovascular: Normal rate, regular rhythm and intact distal pulses.  Exam reveals no gallop and no friction rub.   No murmur heard. Pulmonary/Chest: Effort normal and breath sounds normal. She has no wheezes. She has no rales.  Musculoskeletal: She exhibits no edema.  Skin: Skin is warm and dry.          Assessment & Plan:

## 2011-04-28 NOTE — Assessment & Plan Note (Signed)
Currently stable - follow up with Dr. Sharyn Lull in 2 months. Continue Metoprolol and Norvasc. Continue Plavix and Aspirin. Referral placed for Home Health.

## 2011-04-29 LAB — POCT I-STAT, CHEM 8
BUN: 15
Chloride: 106
Creatinine, Ser: 1
Hemoglobin: 13.9
Potassium: 3.8
Sodium: 141
TCO2: 27

## 2011-04-29 LAB — CBC
MCH: 22 pg — ABNORMAL LOW (ref 26.0–34.0)
MCHC: 30.8 g/dL (ref 30.0–36.0)
MCV: 71.4 fL — ABNORMAL LOW (ref 78.0–100.0)
Platelets: 340 10*3/uL (ref 150–400)

## 2011-05-08 LAB — DIFFERENTIAL
Eosinophils Absolute: 0.1 10*3/uL (ref 0.0–0.7)
Eosinophils Relative: 1 % (ref 0–5)
Lymphocytes Relative: 35 % (ref 12–46)
Lymphs Abs: 2.7 10*3/uL (ref 0.7–4.0)
Monocytes Absolute: 0.5 10*3/uL (ref 0.1–1.0)
Monocytes Relative: 6 % (ref 3–12)

## 2011-05-08 LAB — CBC
HCT: 40.7 % (ref 36.0–46.0)
Hemoglobin: 12.9 g/dL (ref 12.0–15.0)
MCV: 72.9 fL — ABNORMAL LOW (ref 78.0–100.0)
Platelets: 276 10*3/uL (ref 150–400)
RBC: 5.58 MIL/uL — ABNORMAL HIGH (ref 3.87–5.11)
WBC: 7.8 10*3/uL (ref 4.0–10.5)

## 2011-05-08 LAB — POCT I-STAT, CHEM 8
BUN: 17 mg/dL (ref 6–23)
Calcium, Ion: 1.16 mmol/L (ref 1.12–1.32)
Creatinine, Ser: 1 mg/dL (ref 0.4–1.2)
Glucose, Bld: 117 mg/dL — ABNORMAL HIGH (ref 70–99)
Hemoglobin: 13.9 g/dL (ref 12.0–15.0)
Sodium: 143 mEq/L (ref 135–145)
TCO2: 26 mmol/L (ref 0–100)

## 2011-05-08 LAB — URINE CULTURE

## 2011-05-08 LAB — URINALYSIS, ROUTINE W REFLEX MICROSCOPIC
Bilirubin Urine: NEGATIVE
Glucose, UA: NEGATIVE mg/dL
Hgb urine dipstick: NEGATIVE
Protein, ur: NEGATIVE mg/dL
Urobilinogen, UA: 1 mg/dL (ref 0.0–1.0)

## 2011-05-08 LAB — URINE MICROSCOPIC-ADD ON

## 2011-05-08 LAB — B-NATRIURETIC PEPTIDE (CONVERTED LAB): Pro B Natriuretic peptide (BNP): <30 pg/mL (ref 0.0–100.0)

## 2011-05-09 ENCOUNTER — Encounter: Payer: Self-pay | Admitting: Family Medicine

## 2011-05-09 LAB — BASIC METABOLIC PANEL
BUN: 12
Creatinine, Ser: 0.89
GFR calc Af Amer: >60
GFR calc non Af Amer: >60
Glucose, Bld: 98

## 2011-05-09 LAB — CBC
HCT: 38.2
Hemoglobin: 12.4
MCHC: 32.4
Platelets: 336
RBC: 5.42 — ABNORMAL HIGH
RDW: 17.7 — ABNORMAL HIGH

## 2011-05-09 LAB — DIFFERENTIAL
Basophils Absolute: 0
Basophils Relative: 0
Eosinophils Relative: 2
Lymphocytes Relative: 35
Monocytes Absolute: 0.4
Neutro Abs: 4.9

## 2011-05-13 LAB — I-STAT 8, (EC8 V) (CONVERTED LAB)
Bicarbonate: 25.8 — ABNORMAL HIGH
Chloride: 107
Glucose, Bld: 113 — ABNORMAL HIGH
HCT: 43
TCO2: 27
pCO2, Ven: 43.5 — ABNORMAL LOW
pH, Ven: 7.382 — ABNORMAL HIGH

## 2011-05-13 LAB — POCT I-STAT CREATININE
Creatinine, Ser: 1
Operator id: 272551

## 2011-05-19 ENCOUNTER — Inpatient Hospital Stay (HOSPITAL_COMMUNITY)
Admission: EM | Admit: 2011-05-19 | Discharge: 2011-05-21 | DRG: 287 | Disposition: A | Discharge status: Home / Self Care | Payer: Medicaid Other | Attending: Family Medicine | Admitting: Family Medicine

## 2011-05-19 DIAGNOSIS — F329 Major depressive disorder, single episode, unspecified: Secondary | ICD-10-CM | POA: Diagnosis present

## 2011-05-19 DIAGNOSIS — E7800 Pure hypercholesterolemia, unspecified: Secondary | ICD-10-CM | POA: Diagnosis present

## 2011-05-19 DIAGNOSIS — I69959 Hemiplegia and hemiparesis following unspecified cerebrovascular disease affecting unspecified side: Secondary | ICD-10-CM

## 2011-05-19 DIAGNOSIS — E785 Hyperlipidemia, unspecified: Secondary | ICD-10-CM | POA: Diagnosis present

## 2011-05-19 DIAGNOSIS — F3289 Other specified depressive episodes: Secondary | ICD-10-CM | POA: Diagnosis present

## 2011-05-19 DIAGNOSIS — R079 Chest pain, unspecified: Secondary | ICD-10-CM

## 2011-05-19 DIAGNOSIS — Z7982 Long term (current) use of aspirin: Secondary | ICD-10-CM

## 2011-05-19 DIAGNOSIS — I10 Essential (primary) hypertension: Secondary | ICD-10-CM

## 2011-05-19 DIAGNOSIS — E119 Type 2 diabetes mellitus without complications: Secondary | ICD-10-CM | POA: Diagnosis present

## 2011-05-19 DIAGNOSIS — I129 Hypertensive chronic kidney disease with stage 1 through stage 4 chronic kidney disease, or unspecified chronic kidney disease: Secondary | ICD-10-CM | POA: Diagnosis present

## 2011-05-19 DIAGNOSIS — N182 Chronic kidney disease, stage 2 (mild): Secondary | ICD-10-CM | POA: Diagnosis present

## 2011-05-19 DIAGNOSIS — R0789 Other chest pain: Secondary | ICD-10-CM

## 2011-05-19 DIAGNOSIS — E118 Type 2 diabetes mellitus with unspecified complications: Secondary | ICD-10-CM

## 2011-05-19 DIAGNOSIS — Z7902 Long term (current) use of antithrombotics/antiplatelets: Secondary | ICD-10-CM

## 2011-05-19 DIAGNOSIS — I4891 Unspecified atrial fibrillation: Secondary | ICD-10-CM | POA: Diagnosis present

## 2011-05-20 ENCOUNTER — Emergency Department (HOSPITAL_COMMUNITY): Payer: Medicaid Other

## 2011-05-20 DIAGNOSIS — R079 Chest pain, unspecified: Secondary | ICD-10-CM

## 2011-05-20 DIAGNOSIS — Z8673 Personal history of transient ischemic attack (TIA), and cerebral infarction without residual deficits: Secondary | ICD-10-CM | POA: Insufficient documentation

## 2011-05-20 LAB — CBC
HCT: 37.9 % (ref 36.0–46.0)
HCT: 35.7 % — ABNORMAL LOW (ref 36.0–46.0)
Hemoglobin: 12.4 g/dL (ref 12.0–15.0)
Hemoglobin: 11.4 g/dL — ABNORMAL LOW (ref 12.0–15.0)
MCH: 23 pg — ABNORMAL LOW (ref 26.0–34.0)
MCHC: 31.9 g/dL (ref 30.0–36.0)
MCV: 70.3 fL — ABNORMAL LOW (ref 78.0–100.0)
RBC: 5.39 MIL/uL — ABNORMAL HIGH (ref 3.87–5.11)
RBC: 5.1 MIL/uL (ref 3.87–5.11)
WBC: 9.7 10*3/uL (ref 4.0–10.5)
WBC: 8.5 10*3/uL (ref 4.0–10.5)

## 2011-05-20 LAB — GLUCOSE, CAPILLARY
Glucose-Capillary: 113 mg/dL — ABNORMAL HIGH (ref 70–99)
Glucose-Capillary: 166 mg/dL — ABNORMAL HIGH (ref 70–99)

## 2011-05-20 LAB — COMPREHENSIVE METABOLIC PANEL
ALT: 20 U/L (ref 0–35)
ALT: 23 U/L (ref 0–35)
Albumin: 3.5 g/dL (ref 3.5–5.2)
Alkaline Phosphatase: 80 U/L (ref 39–117)
BUN: 11 mg/dL (ref 6–23)
CO2: 27 mEq/L (ref 19–32)
CO2: 28 mEq/L (ref 19–32)
Calcium: 9.4 mg/dL (ref 8.4–10.5)
Calcium: 9.9 mg/dL (ref 8.4–10.5)
GFR calc Af Amer: 90 mL/min — ABNORMAL LOW (ref >90)
GFR calc Af Amer: >90 mL/min (ref >90)
GFR calc non Af Amer: 77 mL/min — ABNORMAL LOW (ref >90)
Glucose, Bld: 113 mg/dL — ABNORMAL HIGH (ref 70–99)
Glucose, Bld: 142 mg/dL — ABNORMAL HIGH (ref 70–99)
Potassium: 4.1 mEq/L (ref 3.5–5.1)
Sodium: 140 mEq/L (ref 135–145)
Sodium: 140 mEq/L (ref 135–145)
Total Protein: 7.2 g/dL (ref 6.0–8.3)

## 2011-05-20 LAB — CARDIAC PANEL(CRET KIN+CKTOT+MB+TROPI)
CK, MB: 1.1 ng/mL (ref 0.3–4.0)
Total CK: 130 U/L (ref 7–177)
Total CK: 79 U/L (ref 7–177)
Troponin I: <0.3 ng/mL (ref <0.30)

## 2011-05-20 LAB — HEPARIN LEVEL (UNFRACTIONATED): Heparin Unfractionated: 0.27 IU/mL — ABNORMAL LOW (ref 0.30–0.70)

## 2011-05-20 LAB — DIFFERENTIAL
Basophils Absolute: 0 10*3/uL (ref 0.0–0.1)
Basophils Relative: 0 % (ref 0–1)
Eosinophils Relative: 0 % (ref 0–5)
Lymphocytes Relative: 30 % (ref 12–46)
Monocytes Relative: 5 % (ref 3–12)
Neutro Abs: 6.3 10*3/uL (ref 1.7–7.7)
Neutrophils Relative %: 65 % (ref 43–77)

## 2011-05-20 LAB — HEMOGLOBIN A1C: Mean Plasma Glucose: 171 mg/dL — ABNORMAL HIGH (ref <117)

## 2011-05-20 LAB — CK TOTAL AND CKMB (NOT AT ARMC): Relative Index: INVALID (ref 0.0–2.5)

## 2011-05-20 NOTE — Progress Notes (Signed)
Family Medicine Teaching Service Intern Progress Note  Subjective: Patient continues to have chest pain despite nitroglycerin and morphine. Chest pain 6/10 with morphine. Squeezing substernal pain slightly to left side of sternum. No N/V/diaphoresis.   Objective:  T 98.7 HR: 62-63 RR: 9-14 BP: 139-152/62-85 SaO2: 100% on 2L I/0 375 in, 200 out.  Gen:  Mild distress. On 2L [place] HEENT: Mucous membranes are moist. CV: regular rate and rhythm. Distant heart sounds-do not appreciate murmur Chest: can reproduce/worsen pain by pressing to left of sternal border PULM: clear to auscultation bilaterally   ABD: soft/nontender/nondistended/slightly decreased bowel sounds EXT: No edema Neuro: alert and oriented x3  Labs and imaging:  1. POC troponin neg x1 plus 2 additional troponin neg. CE neg x3 2. A1c pending. Previous value 7.0 Lab Results  Component Value Date   WBC 8.5 05/20/2011   HGB 11.4* 05/20/2011   HCT 35.7* 05/20/2011   MCV 70.0* 05/20/2011   PLT 300 05/20/2011  hgb down from 12.4 CMP     Component Value Date/Time   NA 140 05/20/2011 0615   K 4.2 05/20/2011 0615   CL 104 05/20/2011 0615   CO2 27 05/20/2011 0615   GLUCOSE 113* 05/20/2011 0615   BUN 11 05/20/2011 0615   CREATININE 0.82 05/20/2011 0615   CREATININE 1.23* 04/28/2011 1615   CALCIUM 9.4 05/20/2011 0615   PROT 7.2 05/20/2011 0615   ALBUMIN 3.5 05/20/2011 0615   AST 19 05/20/2011 0615   ALT 20 05/20/2011 0615   ALKPHOS 70 05/20/2011 0615   BILITOT 0.2* 05/20/2011 0615   GFRNONAA 79* 05/20/2011 0615   GFRAA >90 05/20/2011 0615    Assessment  [name] is a 55 y.o. Female with TIMI 3 (aspirin, angina, 3 CAD risk factors) presenting with chest pain not relieved by nitro.   Plan:  1. Chest pain, rule out myocardial infarction:  -CE negative x3 -Stat EKG unchanged from previous -patient with continuous chest pain despite nitroglycerin and morphine and GI cocktail. Patient started on heparin and nitro  drip with goal bp >110/60.  -Cards consulted given concern for UA and august stress test stress test with possible concern for inferior ischemia, not currently seen on EKG or 2 sets of troponins. -Patient started on temporary diet per cards. Unsure if have cath spot available tomorrow.  - DDx: atypical chest pain vs. stable angina given relief with nitro last night vs. Unstable angina given continued chest pain. Also GERD-will f/u GI cocktail. MSK lower as not reproducible last night, but is somewhat  reproducible today. Patient very tender to left of sternum. Given TIMI 3 and history of CVA strongly favor cath.   -Risk stratification in august with a1c of 7.0, tsh wnl, ldl 67, hdl 40 -continue aspirin and statin for HLD -Echo in 2010 with EF 55-60%. No diastolic dysfunction noted  2. History of paroxysmal atrial fibrillation in the past: Patient was changed to aspirin and Plavix she is regular rate and rhythm at this time and rate controlled with Metroprolol we will continue Plavix and aspirin. Will discuss with cards benefit of this as coumadin is typical regimen.  3. Hypertension: Mildly elevated at this time we will continue home medications of amlodipine 10 and metoprolol and hctz and lisinopril.  4. Non-insulin-dependent diabetes mellitus: Insulin sliding scale and wants patient can start to eat we will start carb modified diet -currently ice chips. Sugars less thn 120. Holding metformin.  5. Morbid obesity-patient needs lifestyle modifications on outpt basis.  6.  History of lacunar infarct in the past-continue aspirin and plavix.  7. History of deep vein thrombosis in the past: Patient was taken off Coumadin and has no further signs of DVT at this time.  8. Depression-continue celexa.  9. Anemia-likely dilutional.   FEN/GI: We'll continue to keep patient n.p.o. in case catheterization will be done normal saline will be running 125 cc an hour  PPx: Prophylactically patient will be on heparin  and a PPI  Disposition: Pending further workup of chest pain   Tana Conch, MD PGY1, Family Medicine Teaching Service [phone]

## 2011-05-20 NOTE — H&P (Signed)
Family Medicine Teaching [facility] Admission History and Physical  Patient name: [name] Medical record number: [id number] Date of birth: 03-Aug-1956 Age: 55 y.o. Gender: female  Primary Care Provider: DE Michel Bickers, MD  Chief Complaint: Chest pain History of Present Illness: [name] is a 55 y.o. year old female presenting with chest pain x1 day. Patient has a recent admission for her stable angina back in March 25, 2011. At that time Dr. Marni Griffon did get a stress test done which showed mild apical and inferoapical ischemia. Patient states that she did followup outpatient and was supposed to followup in 3 months later which would put it into November. When reading patient's discharge note there was concern for potential for doing a catheterization at that time the patient's pain seemed to stabilize and no further further intervention was done. Patient though now has pain left side onset this morning from working up from a nap. Pain is continuous mostly pressure nonradiating associated with sweating but denies nausea or vomiting no numbness in face hand or arm not relieved by 2 nitroglycerin is on the way to hospital did receive one in the ED which did make some improvement states now pain is somewhere about 8/10 no association with food. Nonreproducible. Patient has a past medical history that is significant for diabetes hyperlipidemia hypertension atrial fibrillation. Patient though was taken off Coumadin by cardiologist and placed on aspirin and Plavix recently  Patient Active Problem List  Diagnoses  . DERMATOPHYTOSIS OF FOOT  . DIABETES MELLITUS, TYPE II  . HYPERLIPIDEMIA  . DEPRESSION  . CARPAL TUNNEL SYNDROME, LEFT  . HYPERTENSION  . ATRIAL FIBRILLATION  . CHRONIC KIDNEY DISEASE STAGE II (MILD)  . STRESS INCONTINENCE  . Encounter for long-term (current) use of anticoagulants  . Headache  . Hot flashes   Past Medical History: Past Medical History  Diagnosis  Date  . Hypertension   . Hypertension     Hypertensive urgency 05/2006  . CVA (cerebral vascular accident)     left pontine and frontal lobe    Past Surgical History: Past Surgical History  Procedure Date  . Tonsilectomy, adenoidectomy, bilateral myringotomy and tubes age 29  . Dilation and curettage of uterus 1976  . Meniscus repair 03/09    right knee    Social History: History   Social History  . Marital Status: Single    Spouse Name: N/A    Number of Children: 3  . Years of Education: N/A   Occupational History  . med tech    Social History Main Topics  . Smoking status: Never Smoker   . Smokeless tobacco: Not on file  . Alcohol Use: No  . Drug Use: No  . Sexually Active:    Other Topics Concern  . Not on file   Social History Narrative   Boyfriend has lung cancer.Lives with grandson and youngest daughter.    Family History: Family History  Problem Relation Age of Onset  . Heart failure Mother   . Diabetes Father   . Hypertension Father   . Lung cancer Sister     Allergies: Allergies  Allergen Reactions  . Propoxyphene N-Acetaminophen     REACTION: Swelling of face / hives / Itching    No current facility-administered medications for this encounter.   Current Outpatient Prescriptions  Medication Sig Dispense Refill  . amLODipine (NORVASC) 10 MG tablet Take 1 tablet (10 mg total) by mouth daily.  30 tablet  11  . Blood Glucose  Monitoring Suppl W/DEVICE KIT by Does not apply route 2 (two) times daily.        . celecoxib (CELEBREX) 200 MG capsule Take 1 capsule (200 mg total) by mouth daily.  30 capsule  11  . citalopram (CELEXA) 40 MG tablet Take 1 tablet (40 mg total) by mouth daily.  90 tablet  3  . cloNIDine (CATAPRES) 0.2 MG tablet Take 1 tablet (0.2 mg total) by mouth 2 (two) times daily.  60 tablet  11  . gabapentin (NEURONTIN) 300 MG capsule Take 1 capsule (300 mg total) by mouth at bedtime.  31 capsule  3  . glipiZIDE (GLUCOTROL) 2.5 MG  24 hr tablet Take 1 tablet (2.5 mg total) by mouth daily. With breakfast  90 tablet  3  . glucose blood test strip 1 each by Other route as needed. Use as instructed       . GNP LANCETS 21G MISC by Does not apply route 2 (two) times daily.        Marland Kitchen HYDROcodone-acetaminophen (VICODIN) 5-500 MG per tablet Take 1 tablet by mouth every 4 (four) hours as needed for pain.  20 tablet  0  . lisinopril-hydrochlorothiazide (PRINZIDE,ZESTORETIC) 20-25 MG per tablet Take 1 tablet by mouth daily.  30 tablet  11  . metFORMIN (GLUCOPHAGE) 1000 MG tablet One tab PO qAM, 1/2 tab PO qPM  60 tablet  11  . metoprolol succinate (TOPROL-XL) 25 MG 24 hr tablet Take 1 tablet (25 mg total) by mouth daily.  60 tablet  11  . Misc. Devices (BATH/SHOWER SEAT) MISC by Does not apply route.        Marland Kitchen omeprazole (PRILOSEC) 20 MG capsule Take 1 capsule (20 mg total) by mouth daily.  30 capsule  11  . simvastatin (ZOCOR) 20 MG tablet Take 1 tablet (20 mg total) by mouth at bedtime.  30 tablet  11  . Plavix 75mg  tabs 1 tablet daily 30 11   Review Of Systems: Per HPI with the following additions:  Otherwise 12 point review of systems was performed and was unremarkable.  Physical Exam: Pulse: 86  Blood Pressure: 145/67 RR: 20   O2: 98 on 2L Temp: 98.9  General: alert, cooperative and Does have some hirsutism HEENT: PERRLA, extra ocular movement intact, sclera clear, anicteric, oropharynx clear, no lesions, neck supple with midline trachea and Mild thyromegaly and acathosis nigracans Heart: S1, S2 normal, no murmur, rub or gallop, regular rate and rhythm Lungs: clear to auscultation, no wheezes or rales and unlabored breathing Abdomen: abdomen is soft without significant tenderness, masses, organomegaly or guarding obese Extremities: extremities normal, atraumatic, no cyanosis or edema Skin:no rashes, no ecchymoses Neurology: normal without focal findings, mental status, speech normal, alert and oriented x3, PERLA and reflexes  normal and symmetric  Labs and Imaging: Lab Results  Component Value Date/Time   NA 140 05/19/2011 11:54 PM   K 4.1 05/19/2011 11:54 PM   CL 102 05/19/2011 11:54 PM   CO2 28 05/19/2011 11:54 PM   BUN 11 05/19/2011 11:54 PM   CREATININE 0.83 05/19/2011 11:54 PM   CREATININE 1.23* 04/28/2011  4:15 PM   GLUCOSE 142* 05/19/2011 11:54 PM   Lab Results  Component Value Date   WBC 9.7 05/19/2011   HGB 12.4 05/19/2011   HCT 37.9 05/19/2011   MCV 70.3* 05/19/2011   PLT 331 05/19/2011   Lab Results  Component Value Date   CKTOTAL 98 05/19/2011   CKMB 1.6 05/19/2011   TROPONINI <0.30  03/28/2011   CXR- no active cardiopulmonary disease  Last stress test-03/26/11: 1. Mild apical and inferoapical ischemia. 2. Left ventricular ejection fraction 61%.    Assessment and Plan: BAILLIE MOHAMMAD is a 55 y.o. year old female presenting with chest pain 1. Chest pain, rule out myocardial infarction: The differential includes atypical chest pain vs stable angina. Patient does have multiple risk factors and recent nuclear perfusion study test does show possible concern for inferior ischemia; this is not seen on EKG; there have been no changes on EKG compared to previous EKGs.  Does not seem to be  unstable angina because this is not increase in the amount of episodes recently and patient does not states that there is any increase with exertion. We will do cardiac enzymes x3 we'll admit patient for observations as well as likely a cardiac consult for the potential of doing a diagnostic catheterization; at this time patient has recently been risk stratified with A1c; we'll hold on other risk stratification at this time. Differential diagnosis includes gastroesophageal reflux disease we will do a GI cocktail to see if patient improves symptoms; chest pain does not seem to bet musculoskeletal in nature; chest x-ray normal so not likely pulmonary. No drop in saturation but pulmonary embolus remains in differential,  but low on that list.  2. Hypertension: Mildly elevated at this time we will continue home medications 3. Non-insulin-dependent diabetes mellitus: Insulin sliding scale and wants patient can start to eat we will start car modified diet 4. Hypercholesteremia: Continue current home medications 5. Morbid obesity.  6. History of alcohol abuse.  7. History of lacunar infarct in the past.  8. History of paroxysmal atrial fibrillation in the past: Patient was changed to aspirin and Plavix she is regular rate and rhythm at this time and rate controlled with Metroprolol we will continue Plavix 9. History of deep vein thrombosis in the past: Patient was taken off Coumadin and has no further signs of DVT at this time. 10. We'll continue to keep patient n.p.o. in case catheterization will be done normal saline will be running 125 cc an hour 11. Prophylactically patient will be on heparin and a PPI 12. Disposition: Pending further workup

## 2011-05-21 LAB — GLUCOSE, CAPILLARY
Glucose-Capillary: 179 mg/dL — ABNORMAL HIGH (ref 70–99)
Glucose-Capillary: 114 mg/dL — ABNORMAL HIGH (ref 70–99)
Glucose-Capillary: 122 mg/dL — ABNORMAL HIGH (ref 70–99)
Glucose-Capillary: 124 mg/dL — ABNORMAL HIGH (ref 70–99)
Glucose-Capillary: 250 mg/dL — ABNORMAL HIGH (ref 70–99)

## 2011-05-21 LAB — HEPARIN LEVEL (UNFRACTIONATED): Heparin Unfractionated: 0.36 IU/mL (ref 0.30–0.70)

## 2011-05-21 LAB — PROTIME-INR: INR: 1.04 (ref 0.00–1.49)

## 2011-05-21 LAB — BASIC METABOLIC PANEL
BUN: 8 mg/dL (ref 6–23)
Calcium: 9.4 mg/dL (ref 8.4–10.5)
Chloride: 105 mEq/L (ref 96–112)
Creatinine, Ser: 0.78 mg/dL (ref 0.50–1.10)
GFR calc Af Amer: >90 mL/min (ref >90)

## 2011-05-21 LAB — CBC
Platelets: 314 10*3/uL (ref 150–400)
RBC: 5.36 MIL/uL — ABNORMAL HIGH (ref 3.87–5.11)
RDW: 15.9 % — ABNORMAL HIGH (ref 11.5–15.5)
WBC: 8.2 10*3/uL (ref 4.0–10.5)

## 2011-05-21 LAB — POCT ACTIVATED CLOTTING TIME: Activated Clotting Time: 149 seconds

## 2011-05-21 LAB — PLATELET INHIBITION P2Y12: Platelet Function  P2Y12: 318 [PRU] (ref 194–418)

## 2011-05-21 NOTE — Progress Notes (Signed)
Family Medicine Teaching Service Intern Progress Note   Subjective: Chest pain improved to 2/10 this morning from 6/10 when on nitro gtt yesterday, still a squeezing feeling .  No N/V/diaphoresis.   Objective:  T 99.2 HR: 66-99 RR: 9-21 BP: 140-157/66-92 SaO2: >98% on 2L I/0 net negative 534 CBGs 113-202 with 8 units SSI  Gen: Mild distress. On 2L [place]  HEENT: Mucous membranes are moist.  CV: regular rate and rhythm. Distant heart sounds-do not appreciate murmur  Chest: can reproduce/worsen pain by pressing to left of sternal border  PULM: clear to auscultation bilaterally  ABD: soft/nontender/nondistended/slightly decreased bowel sounds  EXT: No edema  Neuro: alert and oriented x3   Labs and imaging:  1. CE negative x3 2. A1c pending. Previous value 7.0  3. Pending BMET HEPARIN LEVEL     Status: Abnormal   Collection Time   05/20/11  6:38 PM      Component Value Range   Heparin Unfractionated 0.27 (*) 0.30 - 0.70 (IU/mL)  PLATELET INHIBITION P2Y12     Status: Normal   Collection Time   05/21/11  7:30 AM      Component Value Range   Platelet Function  P2Y12 318  194 - 418 (PRU)  CBC     Status: Abnormal   Collection Time   05/21/11  7:30 AM      Component Value Range   WBC 8.2  4.0 - 10.5 (K/uL)   RBC 5.36 (*) 3.87 - 5.11 (MIL/uL)   Hemoglobin 12.4  12.0 - 15.0 (g/dL)   HCT 65.7  84.6 - 96.2 (%)   MCV 71.8 (*) 78.0 - 100.0 (fL)   MCH 23.1 (*) 26.0 - 34.0 (pg)   MCHC 32.2  30.0 - 36.0 (g/dL)   RDW 95.2 (*) 84.1 - 15.5 (%)   Platelets 314  150 - 400 (K/uL)  HEPARIN LEVEL     Status: Normal   05/21/11  7:30 AM      Component Value Range   Heparin Unfractionated 0.36  0.30 - 0.70 (IU/mL)  PROTIME-INR     Status: Normal   05/21/11  7:30 AM      Component Value Range   Prothrombin Time 13.8  11.6 - 15.2 (seconds)   INR 1.04  0.00 - 1.49      Assessment  [name] is a 55 y.o. Female with TIMI 3 (aspirin, angina, 3 CAD risk factors) presenting with chest pain  not relieved by nitro.  Plan:  1. Chest pain, rule out myocardial infarction:  -plan for Cath lab today with Dr. Sharyn Lull  -CE negative x3  -Stat EKG unchanged from previous  -patient with continuous chest pain despite nitroglycerin and morphine and GI cocktail. Patient started on heparin and nitro drip with goal bp >110/60. Chest pain did not improve so stopped nitro drip. Pain improved this morning -Cards consulted given concern for UA and august stress test stress test with possible concern for inferior ischemia, not currently seen on EKG or 3 sets CE. Cards consulted primary cardiologist Dr. Sharyn Lull who plans for cath today.   - DDx: atypical chest pain vs. stable angina given relief with nitro last night vs. Unstable angina given continued chest pain. Also GERD-will f/u GI cocktail. MSK lower as not reproducible last night, but is somewhat reproducible today. Patient very tender to left of sternum. Given TIMI 3 and history of CVA strongly favor cath.  -Risk stratification in august with a1c of 7.0, tsh wnl, ldl 67,  hdl 40  -continue aspirin and statin for HLD  -Echo in 2010 with EF 55-60%. No diastolic dysfunction noted   2. History of paroxysmal atrial fibrillation in the past: Patient was changed to aspirin and Plavix she is regular rate and rhythm at this time and rate controlled with Metroprolol we will continue Plavix and aspirin. Will discuss with cards benefit of this as coumadin is typical regimen.  3. Hypertension: Mildly elevated at this time but patient in pain. We will continue to monitor and continue home medications of amlodipine 10 and metoprolol and hctz and lisinopril.  4. Non-insulin-dependent diabetes mellitus: Insulin sliding scale and wants patient can start to eat we will start carb modified diet -8 units SSI with most cbgs <180.  Holding metformin.  5. Morbid obesity-patient needs lifestyle modifications on outpt basis. Discussed value with patient.  6. History of lacunar  infarct in the past-continue aspirin and plavix.  7. History of deep vein thrombosis in the past: Patient was taken off Coumadin and has no further signs of DVT at this time.  8. Depression-continue celexa.  9. Anemia-likely dilutional on day 1 now resolved.   FEN/GI: NPO for cath. IVF 125/hr PPx: PPI. Heparin off for cath.  Disposition: Pending further workup of chest pain    Tana Conch, MD    PGY1, Family Medicine Teaching Service  [phone]

## 2011-05-22 NOTE — Discharge Summary (Signed)
Physician Discharge Summary  Patient ID: [name] MRN: [id number] DOB/AGE: 04-16-1956 55 y.o.  Admit date: 05/19/2011 Discharge date: 05/21/2011  PCP: DE Michel Bickers, MD of [facility]    Consultants: 1. [facility] 2. Dr. Sharyn Lull of Cardiology     Discharge Diagnosis: Primary 1. Atypical Chest Pain with non significant cardiac catherization Secondary 1  DIABETES MELLITUS, TYPE II   2  HYPERLIPIDEMIA   3  DEPRESSION   4  CARPAL TUNNEL SYNDROME, LEFT   5  HYPERTENSION. Urgency 05/2006  6  ATRIAL FIBRILLATION   7  CHRONIC KIDNEY DISEASE STAGE II (MILD)   8  STRESS INCONTINENCE    9  Hx CVA-left pointine and frontal lobe   10  Hx DVT not currently on coumadin    Hospital Course [name] is a 55 y.o. Female with TIMI 3 (aspirin, angina, 3 CAD risk factors) presenting with chest pain not relieved by nitro fount to have atypical chest pain after a nonsignificant heart catheterization.  Problem List  1. Atypical Chest pain, rule out myocardial infarction:   -CE negative x3, EKGs stable unchanged from previous with no st elevations - continuous chest pain despite nitroglycerin and morphine and GI cocktail. Patient started on heparin and nitro drip with  Chest pain did not improve so stopped nitro drip. Pain improved on it's own -Cards consulted given concern for UA, TIMI 3 and august stress test stress test with possible concern for inferior ischemia.  Cards consulted primary cardiologist Dr. Sharyn Lull who completed a left heart catheterization which showed non-stentable disease and an EF 55%.  -Patient discharged home after catheterization.  -Risk stratification in august with a1c of 7.0, tsh wnl, ldl 67, hdl 40  -continue aspirin and statin for HLD   2. History of paroxysmal atrial fibrillation in the past: Patient was changed to aspirin and Plavix she is regular rate and rhythm at this time and rate controlled with Metroprolol we will  continue Plavix and aspirin. Patient needs discussion with cards on outpatient basis benefit of this as coumadin is typical regimen.  3. Hypertension: Mildly elevated at this time but patient in pain. We will continue to monitor and continue home medications of amlodipine 10 and metoprolol and hctz and lisinopril.  4. Non-insulin-dependent diabetes mellitus:Held metformin. a1c 7.0. SSI in house 5. Morbid obesity-patient needs lifestyle modifications on outpt basis. Discussed value with patient.  6. History of lacunar infarct in the past-continue aspirin and plavix.  7. History of deep vein thrombosis in the past: Patient was taken off Coumadin and has no further signs of DVT at this time.  8. Depression-continue celexa.      Procedures/Imaging:  1. Cath (10/17) LV showed good LV systolic function, EF of 55-60%.  Left main   was very short, which was patent.  LAD has 10-15% ostial stenosis.   Diagonal 1 and 2 were very small, which were patent.  Ramus was small,   which was patent.  Left circumflex was large, which has 15-20% mid-   stenosis.  OM-1 is large, which is patent.  OM-2 is   very small, which is patent.  RCA has 15-20% mid-stenosis.  PLV branch   was very small, which is diffusely diseased.  PDA is small, which is   patent.  The patient tolerated the procedure well.  There were no   complications.  The patient was transferred to the recovery room in   stable condition.  2. CXR (10/16) No  evidence of active pulmonary disease.   Labs  Results for orders placed during the hospital encounter of 05/19/11 (from the past 72 hour(s))  GLUCOSE, CAPILLARY     Status: Abnormal   Collection Time   05/20/11  4:48 PM      Component Value Range Comment   Glucose-Capillary 148 (*) 70 - 99 (mg/dL)   HEPARIN LEVEL     Status: Abnormal   Collection Time   05/20/11  6:38 PM      Component Value Range Comment   Heparin Unfractionated 0.27 (*) 0.30 - 0.70 (IU/mL)   GLUCOSE, CAPILLARY      Status: Abnormal   Collection Time   05/20/11  7:55 PM      Component Value Range Comment   Glucose-Capillary 202 (*) 70 - 99 (mg/dL)    Comment 1 Notify RN      Comment 2 Documented in Chart     GLUCOSE, CAPILLARY     Status: Abnormal   Collection Time   05/20/11 11:29 PM      Component Value Range Comment   Glucose-Capillary 179 (*) 70 - 99 (mg/dL)    Comment 1 Notify RN      Comment 2 Documented in Chart     GLUCOSE, CAPILLARY     Status: Abnormal   Collection Time   05/21/11  3:19 AM      Component Value Range Comment   Glucose-Capillary 142 (*) 70 - 99 (mg/dL)    Comment 1 Notify RN      Comment 2 Documented in Chart     PLATELET INHIBITION P2Y12     Status: Normal   Collection Time   05/21/11  7:30 AM      Component Value Range Comment   Platelet Function  P2Y12 318  194 - 418 (PRU)   CBC     Status: Abnormal   Collection Time   05/21/11  7:30 AM      Component Value Range Comment   WBC 8.2  4.0 - 10.5 (K/uL)    RBC 5.36 (*) 3.87 - 5.11 (MIL/uL)    Hemoglobin 12.4  12.0 - 15.0 (g/dL)    HCT 45.4  09.8 - 11.9 (%)    MCV 71.8 (*) 78.0 - 100.0 (fL)    MCH 23.1 (*) 26.0 - 34.0 (pg)    MCHC 32.2  30.0 - 36.0 (g/dL)    RDW 14.7 (*) 82.9 - 15.5 (%)    Platelets 314  150 - 400 (K/uL)   HEPARIN LEVEL     Status: Normal   Collection Time   05/21/11  7:30 AM      Component Value Range Comment   Heparin Unfractionated 0.36  0.30 - 0.70 (IU/mL)   PROTIME-INR     Status: Normal   Collection Time   05/21/11  7:30 AM      Component Value Range Comment   Prothrombin Time 13.8  11.6 - 15.2 (seconds)    INR 1.04  0.00 - 1.49    BASIC METABOLIC PANEL     Status: Abnormal   Collection Time   05/21/11  7:30 AM      Component Value Range Comment   Sodium 142  135 - 145 (mEq/L)    Potassium 4.1  3.5 - 5.1 (mEq/L)    Chloride 105  96 - 112 (mEq/L)    CO2 24  19 - 32 (mEq/L)    Glucose, Bld 105 (*) 70 - 99 (mg/dL)  BUN 8  6 - 23 (mg/dL)    Creatinine, Ser 1.61  0.50 - 1.10  (mg/dL)    Calcium 9.4  8.4 - 10.5 (mg/dL)    GFR calc non Af Amer >90  >90 (mL/min)    GFR calc Af Amer >90  >90 (mL/min)   GLUCOSE, CAPILLARY     Status: Abnormal   Collection Time   05/21/11  8:46 AM      Component Value Range Comment   Glucose-Capillary 114 (*) 70 - 99 (mg/dL)   GLUCOSE, CAPILLARY     Status: Abnormal   Collection Time   05/21/11 11:40 AM      Component Value Range Comment   Glucose-Capillary 122 (*) 70 - 99 (mg/dL)   POCT ACTIVATED CLOTTING TIME     Status: Normal   Collection Time   05/21/11  1:27 PM      Component Value Range Comment   Activated Clotting Time 149     GLUCOSE, CAPILLARY     Status: Abnormal   Collection Time   05/21/11  1:48 PM      Component Value Range Comment   Glucose-Capillary 124 (*) 70 - 99 (mg/dL)   GLUCOSE, CAPILLARY     Status: Abnormal   Collection Time   05/21/11  5:47 PM      Component Value Range Comment   Glucose-Capillary 250 (*) 70 - 99 (mg/dL)         Patient condition at time of discharge/disposition: stable  Disposition-home, has aide at home due to previous stroke although patient with no focal neurological deficits.    Discharge follow up: Patient is to make her own appointment with Dr. Tye Savoy for 1-2 weeks.    Follow up issues: 1. Please monitor blood pressure as was elevated during hospitalization but thought secondary to pain.  2. Please describe results of catheterization to patient.  3. Please make sure patient is following up with Dr. Sharyn Lull.  4. Please encourage lifestyle changes as patient is morbidly obese and has deconditioning due to inactivity. May benefit from an exercise program especially with a essentially normal cath.    Discharge Medication List as of 05/21/2011  7:51 PM   Details  amLODipine (NORVASC) 5 MG tablet Take 1 tablet (5 mg total) by mouth daily., Starting 11/18/2010, Until Discontinued, Normal    citalopram (CELEXA) 40 MG tablet Take 1 tablet (40 mg total) by mouth daily.,  Starting 11/18/2010, Until Discontinued, Normal    glipiZIDE (GLUCOTROL) 2.5 MG 24 hr tablet Take 1 tablet (2.5 mg total) by mouth daily. With breakfast, Starting 11/18/2010, Until Discontinued, Normal    lisinopril-hydrochlorothiazide (PRINZIDE,ZESTORETIC) 20-25 MG per tablet Take 1 tablet by mouth daily., Starting 11/18/2010, Until Tue 11/18/11, Normal    metFORMIN (GLUCOPHAGE) 1000 MG tablet One tab PO qAM, 1/2 tab PO qPM, Normal    metoprolol succinate (TOPROL-XL) 50 MG 24 hr tablet Take 1 tablet (50 mg total) by mouth daily., Starting 11/18/2010, Until Discontinued, Normal    omeprazole (PRILOSEC) 20 MG capsule Take 1 capsule (20 mg total) by mouth daily., Starting 11/18/2010, Until Discontinued, Normal    Rosuvastatin 10 mg Take 1 tablet (10 mg total) by mouth at bedtime.    Nitro SL 0.4mg  for chest pain  1 by mouth every 5 minutes as needed up to 3 doses    Tramadol 50 mg tab by mouth BID prn  Aspirin 81 mg 1 tablet by mouth daily  Plavix 75 mg  1 tablet  by mouth daily      Signed: Tana Conch 05/22/2011, 10:19 PM  Dictation [id number]

## 2011-05-22 NOTE — H&P (Addendum)
[name], Carla NO.:  [id number]  MEDICAL RECORD NO.:  [id number]  LOCATION:  [facility]                         FACILITY:  [facility]  PHYSICIAN:  Nestor Ramp, MD        DATE OF BIRTH:  Oct 16, 1955  DATE OF ADMISSION:  05/20/2011 DATE OF DISCHARGE:                             HISTORY & PHYSICAL   PRIMARY CARE PROVIDER:  Barnabas Lister, MD of [facility].  CHIEF COMPLAINT:  Chest pain.  HISTORY OF PRESENT ILLNESS:  [name] is a 55 year old female with a past medical history significant for diabetes, hyperlipidemia, hypertension, and atrial fibrillation, who is coming in with chest pain x1 day.  She has had a recent admission back for stable angina in March 25, 2011, at follow up with Dr. Sharyn Lull who had a stress test done, outpatient that showed some mild apical and inferior apical ischemic changes, but did not do any other type of intervention.  The patient states now that she has had 1 day of this chest pain, left-sided, nonradiating, started after she woke up from a nap yesterday morning. The patient states that this pain seems to be continuous, mostly pressure, nonradiating, nonpleuritic, no leg swelling recently.  She did take 2 nitroglycerins at home that did not seem to help resolve the pain and then one more now in the ED that did help somewhat bringing her pain from a 10/10 down to a 7/10.  Pain seems to be nonreproducible.  The patient's only changes in medication recently has been the cardiologist changed her from Coumadin to aspirin and Plavix.  PAST MEDICAL HISTORY: 1. Diabetes type 2. 2. Hyperlipidemia. 3. Depression. 4. Hypertension. 5. Atrial fibrillation, rate controlled. 6. Stress incontinence. 7. History of a CVA, left pontine and frontal lobe.  PAST SURGICAL HISTORY:  Patient had a tonsillectomy adenoidectomy and bilateral myringotomy and tubes at age 58.  The patient had a meniscus repair of the right knee back in  2009.  SOCIAL HISTORY:  Patient is single.  Does have 3 children.  Works as a Scientist, clinical (histocompatibility and immunogenetics).  Does not smoke, no drugs, and no illicits.  FAMILY HISTORY:  Significant for heart failure, diabetes, hypertension, and lung cancer.  ALLERGIES:  Allergic to DARVOCET.  MEDICATIONS: 1. Celebrex 200 mg daily. 2. Celexa 40 mg daily. 3. Lisinopril/hydrochlorothiazide 1 tablet daily. 4. Metformin 1000 mg q.a.m. and half-tab q.p.m. 5. Toprol 25 mg taking 1 pill daily. 6. Omeprazole 1 capsule daily. 7. Zocor 20 mg at night. 8. Plavix 75 mg daily. 9. Aspirin 81 mg p.o. daily. 10.Amlodipine 1 tab by mouth daily.  PHYSICAL EXAMINATION:  VITAL SIGNS:  Pulse 86, blood pressure 145/67, respiration rate 20, O2 98% on 2 L, temp 98.9. GENERAL:  The patient generally is alert, cooperative, and does have some hirsutism. HEENT:  Pupils equal, round, and reactive to light and accommodation. Extraocular movements intact.  Sclerae clear, anicteric.  No lesions. NECK:  Supple with midline trachea.  Does have some mild thyromegaly, but diffusely no masses, and patient does have Acanthosis nigricans on the neck.  CARDIOVASCULARLY:  Regular rate and rhythm, somewhat distant but no murmur appreciated. LUNGS:  Clear  to auscultation.  No wheezing, no rales.  Unlabored breathing.  ABDOMEN:  Soft without significant tenderness, masses, organomegaly, or guarding. EXTREMITIES:  Normal, atraumatic.  No cyanosis or edema. NEUROLOGY:  Cranial nerves II-XII intact.  No focal findings.  LABS:  The patient had a BMET done, had a sodium of 140, potassium of 4.1, chloride of 102, bicarb of 28, BUN of 11, and creatinine of 1.23. White blood cell count of 9.7, hemoglobin of 12.4, platelets of 331. Patient's cardiac enzymes are negative x1.  Patient's chest x-ray does not show no active cardiopulmonary disease.  The patient's EKG shows no changes from previous results.  ASSESSMENT AND PLAN:  [name] is a 55 year old female  presenting with chest pain.  1. Chest pain, rule out myocardial infraction.  Differential includes     atypical chest pain versus unstable angina at this time.  The     patient does have multiple risk factors and recent stress test does     show concern for inferior ischemia, but EKG does not show any acute     changes; we would cycle cardiac enzymes.  The patient has been risk     stratified recently, so no need to get repeat of those labs.  Patient's last     A1c was 7.1.  I do not think that this is unstable angina at this     time, so we will not do a heparin drip;  if we had some type of ST     depressions in the EKG or history was more with exertion and     improved with rest, then I would thnk unstable angina higher on differential. We will call for Cardiac consult in the morning, see if     they want to do a diagnostic catheterization and monitor from     there.  Differential diagnosi for non cardiac origin etuilogies includes gastroesophageal reflux     disease or musculoskeletal.or infectious but given current clinical picture these are less likely.Patient has no fevers or chills and low likelihood of     the deep vein thrombosis present without any leg swelling or pain or warmth,no shortness of breath, no new oxygen requirement, so     pulmonary embolism is low on differential as well. Monitor closely for worsening of symptoms, new symptoms as these would change current plan. 2. Hypertension, mildly elevated at this time.  Continue home     medications. 3. Diabetes, insulin sliding scale. 4. Hypercholesterolemia.  Continue statin. 5. History of cerebrovascular accident in the past.  Continue the     Plavix and aspirin.  Unclear why she was takenoff coumadin and placed on plavix but will follow her outpatient regimen at this time. 6. History of paroxysmal atrial fibrillation in the past, is on     aspirin and Plavix.  The patient's CHADS score is fairly high at 4,     but this  change was done by Cardiology.  We will have them see     patient for further management. 7. Fluids, electrolytes, and nutrition and Gastrointestinal.  We will     give a little bolus and then mainteneance fluids. 8. Prophylaxis.  We will do heparin Preston for DVT prophylaxis and PPI for GI prophylaxis.Marland Kitchen 9. Disposition is to be pending further workup.     Nestor Ramp, MD   ______________________________ Nestor Ramp, MD    SLN/MEDQ  D:  05/20/2011  T:  05/20/2011  Job:  [id number]  Electronically Signed by Denny Levy MD on 05/22/2011 10:34:25 AM

## 2011-05-27 ENCOUNTER — Ambulatory Visit (INDEPENDENT_AMBULATORY_CARE_PROVIDER_SITE_OTHER): Payer: Medicaid Other | Admitting: Family Medicine

## 2011-05-27 ENCOUNTER — Encounter: Payer: Self-pay | Admitting: Family Medicine

## 2011-05-27 VITALS — BP 145/93 | HR 88 | Temp 98.3°F | Ht 62.0 in | Wt 277.6 lb

## 2011-05-27 DIAGNOSIS — R079 Chest pain, unspecified: Secondary | ICD-10-CM

## 2011-05-27 DIAGNOSIS — Z23 Encounter for immunization: Secondary | ICD-10-CM

## 2011-05-27 DIAGNOSIS — F329 Major depressive disorder, single episode, unspecified: Secondary | ICD-10-CM

## 2011-05-27 DIAGNOSIS — K5900 Constipation, unspecified: Secondary | ICD-10-CM

## 2011-05-27 MED ORDER — DOCUSATE SODIUM 100 MG PO CAPS: 100.0000 mg | ORAL_CAPSULE | Freq: Two times a day (BID) | ORAL | Status: AC

## 2011-05-27 MED ORDER — DULOXETINE HCL 30 MG PO CPEP: 30.0000 mg | ORAL_CAPSULE | Freq: Every day | ORAL | Status: DC

## 2011-05-27 MED ORDER — PNEUMOCOCCAL VAC POLYVALENT 25 MCG/0.5ML IJ INJ: 0.5000 mL | INJECTION | Freq: Once | INTRAMUSCULAR | Status: DC

## 2011-05-27 MED ORDER — CITALOPRAM HYDROBROMIDE 20 MG PO TABS: 20.0000 mg | ORAL_TABLET | Freq: Every day | ORAL | Status: DC

## 2011-05-27 NOTE — Patient Instructions (Signed)
Please stop taking Celexa 40 mg today. Start taking Celexa 20 mg daily x 1 week, then stop. Start Cymbalta 30 mg daily x 2 weeks, then return to clinic for follow-up. For constipation: take Colace 100 mg twice daily as needed. Hold for loose stool. Please call Dr. Wyona Almas, nutritionist, to schedule appointment to discuss diet and lifestyle modifications. Schedule a follow up visit me in 3 weeks. Thank you.

## 2011-05-28 DIAGNOSIS — K5900 Constipation, unspecified: Secondary | ICD-10-CM | POA: Insufficient documentation

## 2011-05-28 NOTE — Assessment & Plan Note (Addendum)
Chest pain and low back pain likely manifestation of uncontrolled major depression Plan: --> Taper Celexa 40 mg daily to Celexa 20 mg daily x 1 week, then DISCONTINUE. --> Start Cymbalta 30 mg daily x 2 weeks, then return to clinic for follow-up in 3 weeks --> Will need to increase Cymbalta to 60 mg daily at next visit --> Discussed with patient that Cymbalta should improve chronic pain and depression --> Patient also needs to lose weight, gave patient information to call Dr. Gerilyn Pilgrim for Nutrition consult --> Will follow up on a monthly basis - patient's depression needs to be better controlled --> Will need to fill out PHQ9 at next visit for documentation --> Follow up appointment in 3 weeks

## 2011-05-28 NOTE — Progress Notes (Signed)
  Subjective:    Patient ID: [name], female    DOB: 1956/04/28, 55 y.o.   MRN: [id number]  HPI    Review of Systems  Per HPI    Objective:   Physical Exam  Constitutional: No distress.       Morbidly obese          Assessment & Plan:

## 2011-05-28 NOTE — Progress Notes (Signed)
  Subjective:    Patient ID: [name], female    DOB: [birth year], 55 y.o.   MRN: [id number]  HPI  Chest pain: patient endorses intermittent chest pain since hospital visit.  Pain comes and goes, resolves on its own.  Has not needed to take NTG for chest pain episodes.  Describes pain as "it feels like when I eat a big sandwich, it gets stuck in my chest."  Denies reflux, chronic cough, abdominal pain.  Denies nausea/vomiting, diaphoresis, pain that radiates to jaw or shoulders.  Does endorse SOB but this is chronic and unrelated to CP.  I reviewed patient's lab results, cath results from last hospital visit.  Patient to follow up with Dr. Sharyn Lull (cardiology) in one week.  Major depression/Chronic back pain: this is a chronic issue diagnosed by Dr. Karie Schwalbe. Several months ago.  Patient takes Celexa 40 mg daily, but she does not think this has improved her mood.  She continues to feel depressed and has crying spells.  Denies any homicidal or suicidal thoughts.  Patient also complains of chronic back pain that is worse now that Celebrex was discontinued (cannot take with Plavix and Aspirin).   Wants to know if she can take something else for pain.  Tramadol has not helped in the past.  Constipation: has a BM every other day.  Has not tried any OTC remedies yet. Denies bloody stool, abdominal pain, nausea/vomiting.  Normal appetite and passing gas.    Review of Systems  Denies nausea/vomiting, fever, chills, SOB associated with CP, diaphoresis.  Endorses constipation, but denies abdominal pain or distension.  Endorses depressed mood, but no feelings of suicide or homicide.    Objective:   Physical Exam  Constitutional: No distress.       Morbidly obese  Neck: Normal range of motion. No JVD present.  Cardiovascular: Normal rate, regular rhythm, normal heart sounds and intact distal pulses.   No murmur heard. Pulmonary/Chest: Effort normal and breath sounds normal. She has no wheezes. She has no  rales.  Abdominal: Soft. Bowel sounds are normal. She exhibits no distension. There is no tenderness.  Psychiatric: Her speech is normal. Judgment and thought content normal. She is slowed and withdrawn. Cognition and memory are normal. She exhibits a depressed mood.          Assessment & Plan:

## 2011-05-28 NOTE — Assessment & Plan Note (Signed)
Patient admitted to hospital for ACS rule out: non-obstructive cardiac cath, CE negative.  Chest pain is intermittent, resolves on its own. --> CP may be secondary to GERD, encouraged patient to take Prilosec daily and may increase to BID dosing  --> CP may be exacerbated by symptoms of depression, will taper Celexa and start Cymbalta --> Patient to follow up in 3 weeks --> Red flags reviewed, return to clinic or go to ED if pt experiences CP not relieved by NTG or is associated with N/V, SOB, diaphoresis --> Patient agreed/understood plan

## 2011-05-28 NOTE — Assessment & Plan Note (Signed)
Etiology, uncertain.  Likely multifactorial. --> Will give Colace 100 mg BID prn for constipation, hold for loose stool --> Encouraged high fiber diet --> Gave patient information for referral to Dr. Gerilyn Pilgrim regarding diet and lifestyle modifications --> Follow up in 3 weeks

## 2011-05-29 NOTE — Cardiovascular Report (Signed)
NAMECHEROKEE, BOCCIO NO.:  [id number]  MEDICAL RECORD NO.:  [id number]  LOCATION:  2505                         FACILITY:  [facility]  PHYSICIAN:  Eduardo Osier. Sharyn Lull, [name] DATE OF BIRTH:  [birth year]  DATE OF PROCEDURE:  05/21/2011 DATE OF DISCHARGE:  05/21/2011                           CARDIAC CATHETERIZATION   PROCEDURES:  Left cardiac cath with selective left and right coronary angiography, left ventriculography via right groin using Judkins technique.  INDICATIONS FOR THE PROCEDURE:  [name] is 55 year old black female with past medical history significant for hypertension, non-insulin- dependent diabetes mellitus, hypercholesteremia, morbid obesity, history of lacunar infarct in the past, history of paroxysmal AFib in the past, history of DVT in the past, was admitted last night because of retrosternal chest pain described as squeezing pressure, which woke her up from sleep without any associated nausea, vomiting, diaphoresis. Denies palpitation, lightheadedness, or syncope.  Denies PND, orthopnea, or leg swelling.  The patient was recently discharged from the hospital, had Lexiscan Myoview scan, which showed mild apical and apical-lateral wall reversible ischemia with the EF of 61%.  The patient opted for medical management initially, but none.  Due to recurrent chest pain, the patient consented for PCI.  After obtaining the informed consent, the patient was brought to the cath lab and was placed on fluoroscopy table.  Right groin was prepped and draped in usual fashion.  Xylocaine 1% was used for local anesthesia in the right groin.  With the help of thin wall needle, a 5-French arterial sheath was placed.  The sheath was aspirated and flushed.  Next, a 5-French left Judkins catheter was advanced over the wire under fluoroscopic guidance up to the ascending aorta.  Wire was pulled out, the catheter was aspirated and connected to the Manifold.  Catheter  was further advanced and engaged into right coronary ostium.  Multiple views of the right system were taken.  Next, the catheter was disengaged and was pulled out over the wire and was replaced with 5-French right Judkins catheter, which was advanced over the wire under fluoroscopic guidance up to the ascending aorta.  Wire was pulled out, the catheter was aspirated and connected to the Manifold.  The catheter was further advanced and engaged into right coronary ostium.  Multiple views of the right system were taken.  Next, the catheter was disengaged and was pulled out over the wire and was replaced with 5-French pigtail catheter, which was advanced over the wire under fluoroscopic guidance up to the ascending aorta.  Wire was pulled out, the catheter was aspirated and connected to the Manifold. Catheter was further advanced across the aortic valve into the LV.  LV pressures were recorded.  Next, left ventriculography was done in 30- degree RAO position.  Post-angiographic pressures were recorded from LV and then pullback pressures were recorded from the aorta.  There was no gradient across the aortic valve.  Next, the pigtail catheter was pulled out over the wire.  Sheaths were aspirated and flushed.  FINDINGS:  LV showed good LV systolic function, EF of 55-60%.  Left main was very short, which was patent.  LAD has 10-15% ostial stenosis. Diagonal 1 and 2 were  very small, which were patent.  Ramus was small, which was patent.  Left circumflex was large, which has 15-20% mid- stenosis.  OM-1 is large, which is patent.  OM-2 is very small, which is patent.  RCA has 15-20% mid-stenosis.  PLV branch was very small, which is diffusely diseased.  PDA is small, which is patent.  The patient tolerated the procedure well.  There were no complications.  The patient was transferred to the recovery room in stable condition.     Eduardo Osier. Sharyn Lull, [name]     MNH/MEDQ  D:  05/21/2011  T:   05/22/2011  Job:  [id number]  Electronically Signed by Rinaldo Cloud [name] on 05/29/2011 10:03:58 AM

## 2011-05-30 NOTE — Discharge Summary (Signed)
NAMEJOELEEN, [name] NO.:  [id number]  MEDICAL RECORD NO.:  [id number]  LOCATION:  2505                         FACILITY:  [facility]  PHYSICIAN:  Santiago Bumpers. Dayanna Pryce, M.D.DATE OF BIRTH:  12-15-1955  DATE OF ADMISSION:  05/19/2011 DATE OF DISCHARGE:  05/21/2011                              DISCHARGE SUMMARY   PRIMARY CARE PHYSICIAN:  Barnabas Lister, MD at [facility].  CONSULTANTS: 1. [facility]. 2. Eduardo Osier. Sharyn Lull, MD of Cardiology.  DISCHARGE DIAGNOSES:   Primary: 1. Atypical chest pain with nonobstructive CAD per cath.  Secondary: 1. Diabetes mellitus type 2. 2. Hyperlipidemia. 3. Depression. 4. Carpal tunnel syndrome, left. 5. Hypertension with urgency in October 2007. 6. Atrial fibrillation. 7. Chronic kidney disease, stage II. 8. Stress incontinence. 9. History of cerebrovascular accident, left pontine and frontal lobe. 10.History deep vein thrombosis, not currently on Coumadin.  HOSPITAL COURSE:  [name] is a 55 year old female with a TIMI 3 - angina, 3 CAD factors presenting with chest pain, not relieved by nitro. Found to have atypical chest pain after a catheterization with nonobstructive CAD  1. Atypical Chest pain, rule out myocardial infarction:  -CE negative x3, EKGs stable unchanged from previous with no st elevations - continuous chest pain despite nitroglycerin and morphine and GI cocktail. Patient started on heparin and nitro drip with  Chest pain did not improve so stopped nitro drip. Pain improved on it's own -Cards consulted given concern for UA, TIMI 3 and august stress test stress test with possible concern for inferior ischemia.  Cards consulted primary cardiologist Dr. Sharyn Lull who completed a left heart catheterization which showed nonobstructive CAD and  an EF 55%.   -Patient discharged home after catheterization.   -Risk stratification in august with a1c of 7.0, tsh wnl, ldl 67, hdl 40   -continue  aspirin and statin for HLD  2. History of paroxysmal atrial fibrillation in the past: Patient was changed to aspirin and Plavix she is regular rate and rhythm at this time and rate controlled with Metroprolol we will continue Plavix and aspirin. Patient needs discussion with cards  on outpatient basis benefit of this as coumadin is typical regimen.   3. Hypertension: Mildly elevated at this time but patient in pain. We will continue to monitor and continue home medications of amlodipine 10 and metoprolol and hctz and lisinopril.   4. Non-insulin-dependent diabetes mellitus:Held metformin. a1c 7.0. SSI in house 5. Morbid obesity-patient needs lifestyle modifications on outpt basis. Discussed value with patient.   6. History of lacunar infarct in the past-continue aspirin and plavix.   7. History of deep vein thrombosis in the past: Patient was taken off Coumadin and has no further signs of DVT at this time.   8. Depression-continue celexa.  Procedures/Imaging:  1. Cath (10/17) LV showed good LV systolic function, EF of 55-60%.  Left main    was very short, which was patent.  LAD has 10-15% ostial stenosis.    Diagonal 1 and 2 were very small, which were patent.  Ramus was small,    which was patent.  Left circumflex was large, which has 15-20% mid-    stenosis.  OM-1 is large, which is patent.  OM-2 is    very small, which is patent.  RCA has 15-20% mid-stenosis.  PLV branch    was very small, which is diffusely diseased.  PDA is small, which is    patent.  The patient tolerated the procedure well.  There were no    complications.  The patient was transferred to the recovery room in   stable condition.   2. CXR (10/16) No evidence of active pulmonary disease.   LABORATORY DATA: 1. Platelet inhibition P2Y12  of 318 within normal limits. 2. CBC, white count 8.3, hemoglobin 12.4, platelets 314. 3. BMET, 142, 4.1, 105, 24, 105, 8, 0.7, 89.4. 4. Activated clotting time 1:49.  CONDITION AT TIME  OF DISCHARGE:  Stable.  DISPOSITION:  Home.  The patient has a home aide due to previous stroke, although the patient with no focal or neurological deficits.  She may do suffer from chronic deconditioning.  DISCHARGE FOLLOWUP:  The patient is to make her own appointment with Dr. Tye Savoy for 1-2 weeks.  Sent a message to [facility] to call the patient to arrange for appointment since she has not arranged an appointment yet at this time.  FOLLOWUP ISSUES: 1. Please monitor blood pressure, as was elevated during     hospitalization but thought secondary to pain. 2. Please describe the results of catheterization to the patient. 3. Please make sure the patient is following up with Dr. Sharyn Lull. 4. Please encourage lifestyle changes as the patient is morbidly obese     and has deconditioning due to inactivity, may benefit from an     exercise program especially with an essentially normal cath.  DISCHARGE MEDICATIONS: 1. Norvasc 5 mg 1 by mouth daily. 2. Celexa 40 mg 1 by mouth daily. 3. Glipizide 2.5 mg 24-hour tablet, take 1 by mouth daily. 4. Lisinopril/hydrochlorothiazide 20/25, take 1 tablet by mouth daily. 5. Metformin 1000 mg, take 1 p.o. in the morning, take a half a tablet     p.o. in the evening. 6. Toprol-XL, take 1 tablet 50 mg by mouth daily. 7. Omeprazole 20 mg, take 1 capsule by mouth daily. 8. Rosuvastatin 10 mg, take 1 tablet by mouth daily at bedtime. 9. Nitroglycerin sublingual 0.4 mg for chest pain 1 by mouth every 5     minutes as needed up to 3 doses. 10.Tramadol 50 mg tab by mouth b.i.d. p.r.n. 11.Aspirin 81 mg 1 tablet by mouth daily. 12.Plavix 75 mg, take 1 tablet by mouth daily.    ______________________________ Tana Conch, MD   ______________________________ Santiago Bumpers. Leveda Anna, [name]    SH/MEDQ  D:  05/23/2011  T:  05/23/2011  Job:  [id number]  cc:   Ivy de Lawson Radar, MD Eduardo Osier. Sharyn Lull, [name]  Electronically Signed by Tana Conch MD on 05/27/2011 04:32:51 AM Electronically Signed by Doralee Albino [name] on 05/30/2011 08:25:07 AM

## 2011-06-01 NOTE — Consult Note (Signed)
[name], [name] NO.:  [id number]  MEDICAL RECORD NO.:  [id number]  LOCATION:  [facility]                         FACILITY:  [facility]  PHYSICIAN:  Doylene Canning. Ladona Ridgel, MD    DATE OF BIRTH:  [birth year]  DATE OF CONSULTATION:  05/20/2011 DATE OF DISCHARGE:                                CONSULTATION   CONSULTATION REQUESTED BY:  [facility].  REASON FOR CONSULTATION:  Evaluation of chest pain.  HISTORY OF PRESENT ILLNESS:  The patient is a 55 year old morbidly obese woman with a history of hypertension, dyslipidemia, and diabetes, who was admitted to the hospital with substernal chest pain.  She has ruled out so far for myocardial infarction.  The patient's history dates back approximately 2 months ago where she was again admitted with chest pain and had a stress test which was abnormal.  She was considered for outpatient catheterization, but this did not get scheduled.  The patient was in her usual state of health until 1 day prior to admission when she was awakened with substernal chest pain.  There was minimal relief with nitroglycerin.  Her EKG was abnormal, though did not demonstrate any acute findings and she was admitted for additional evaluation.  She has ruled out for MI with her initial cardiac markers being negative.  She has had ongoing chest pain which had mild improvement with nitroglycerin.  She does not have significant shortness of breath, nausea, vomiting, or diaphoresis.  GI cocktail, however, did not provide relief either.  The patient is referred for additional evaluation.  She has not had syncope.  She has very minimal peripheral edema.  She does have some swelling in her leg and knee from arthritis.  PAST MEDICAL HISTORY:  As noted in the HPI.  She also has a history of stroke with mild left hemipareses, questionable mild left visual field defect.  She has a history of DVT.  SOCIAL HISTORY:  The patient lives with a daughter  in [place].  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  She denies tobacco or alcohol abuse.  FAMILY HISTORY:  Notable for mother died at age 49 of an MI.  Father has hypertension and diabetes.  She has multiple siblings, no coronary artery disease.  REVIEW OF SYSTEMS:  Notable for headache on nitroglycerin.  She has multiple arthritic complaints and arthralgias.  She has a history of depression.  Otherwise, review of systems is negative.  PHYSICAL EXAMINATION:  GENERAL:  She is an obese, middle-aged woman in no acute distress. VITAL SIGNS:  Blood pressure was 140/70, the pulse was 63 and regular, the respirations were 14, temperature was 98. HEENT:  Normocephalic and atraumatic.  Pupils are equal and round. Oropharynx is moist.  Sclerae are anicteric. NECK:  No jugular venous distention.  There are no thyromegaly.  Trachea is midline.  The carotids are 2+ and symmetric. LUNGS:  Clear bilaterally to auscultation.  No wheezes, rales, or rhonchi are present.  Breath sounds were somewhat decreased. CARDIOVASCULAR:  Regular rate and rhythm.  Normal S1 and S2. HEART:  Heart sounds were somewhat decreased.  No obvious murmurs. ABDOMEN:  Massively obese, nontender, nondistended.  There is no organomegaly.  EXTREMITIES:  Trace peripheral edema bilaterally.  There is no cyanosis or clubbing. NEUROLOGIC:  Alert and oriented x3 with cranial nerves intact.  Strength is 5/5 and symmetric.  EKG demonstrates sinus rhythm with nonspecific ST-T-wave changes.  Chest x-ray demonstrates no evidence of congestive heart failure.  Her EKG demonstrates sinus rhythm with nonspecific ST-T-wave abnormality.  INITIAL LABORATORY DATA:  Cardiac markers are all negative.  Potassium was 4, creatinine was 0.8, hemoglobin was 11.  IMPRESSION: 1. Chest pain with typical as well as atypical features. 2. Diabetes. 3. Hypertension. 4. Morbid obesity. 5. History of chronic renal insufficiency.  Recommendations at  this time would be that I will call Dr. Sharyn Lull, allow him to assume further care.  We will continue her current medical therapy for now.  I am not convinced her chest pain is due to coronary ischemia.  However, with her strong family history as well as abnormal EKG and multiple cardiac risk factors and positive stress test, left heart catheterization would certainly be a strong consideration.     Doylene Canning. Ladona Ridgel, MD     GWT/MEDQ  D:  05/20/2011  T:  05/20/2011  Job:  [id number]  cc:   Ivy de Lawson Radar, MD Eduardo Osier. Sharyn Lull, [name]  Electronically Signed by Lewayne Bunting MD on 06/01/2011 12:19:18 PM

## 2011-06-17 ENCOUNTER — Ambulatory Visit: Payer: Medicaid Other | Admitting: Family Medicine

## 2011-07-01 ENCOUNTER — Ambulatory Visit: Payer: Medicaid Other | Admitting: Family Medicine

## 2011-07-04 ENCOUNTER — Telehealth: Payer: Self-pay | Admitting: *Deleted

## 2011-07-04 NOTE — Telephone Encounter (Signed)
Patient calls requesting podiatry referral. States toenails are very long and are curling under now. Also she has a black streak on left great toenail which has been present for 2 months. Will forward to dr. Tye Savoy to ask about making a referral.

## 2011-07-07 ENCOUNTER — Telehealth: Payer: Self-pay | Admitting: *Deleted

## 2011-07-07 NOTE — Telephone Encounter (Signed)
Called and lvm for pt informing her of appt with [facility] [address] for 12.10.2010 @ 1000 am. If pt cannot keep appt she will need to contact their office at [phone] 24-48 in advance to cancel/reschedule or she may be charged a fee.Laureen Ochs, Viann Shove

## 2011-08-10 ENCOUNTER — Encounter (HOSPITAL_COMMUNITY): Payer: Self-pay | Admitting: *Deleted

## 2011-08-10 ENCOUNTER — Encounter: Payer: Self-pay | Admitting: Family Medicine

## 2011-08-10 ENCOUNTER — Emergency Department (HOSPITAL_COMMUNITY)
Admission: EM | Admit: 2011-08-10 | Discharge: 2011-08-10 | Disposition: A | Discharge status: Home / Self Care | Payer: Medicaid Other | Attending: Emergency Medicine | Admitting: Emergency Medicine

## 2011-08-10 ENCOUNTER — Other Ambulatory Visit: Payer: Self-pay

## 2011-08-10 ENCOUNTER — Emergency Department (HOSPITAL_COMMUNITY): Payer: Medicaid Other

## 2011-08-10 DIAGNOSIS — R110 Nausea: Secondary | ICD-10-CM | POA: Insufficient documentation

## 2011-08-10 DIAGNOSIS — M545 Low back pain, unspecified: Secondary | ICD-10-CM | POA: Insufficient documentation

## 2011-08-10 DIAGNOSIS — M25473 Effusion, unspecified ankle: Secondary | ICD-10-CM | POA: Insufficient documentation

## 2011-08-10 DIAGNOSIS — Z8673 Personal history of transient ischemic attack (TIA), and cerebral infarction without residual deficits: Secondary | ICD-10-CM | POA: Insufficient documentation

## 2011-08-10 DIAGNOSIS — M25476 Effusion, unspecified foot: Secondary | ICD-10-CM | POA: Insufficient documentation

## 2011-08-10 DIAGNOSIS — R079 Chest pain, unspecified: Secondary | ICD-10-CM | POA: Insufficient documentation

## 2011-08-10 DIAGNOSIS — R0602 Shortness of breath: Secondary | ICD-10-CM | POA: Insufficient documentation

## 2011-08-10 DIAGNOSIS — Z7982 Long term (current) use of aspirin: Secondary | ICD-10-CM | POA: Insufficient documentation

## 2011-08-10 DIAGNOSIS — R42 Dizziness and giddiness: Secondary | ICD-10-CM | POA: Insufficient documentation

## 2011-08-10 DIAGNOSIS — I4891 Unspecified atrial fibrillation: Secondary | ICD-10-CM | POA: Insufficient documentation

## 2011-08-10 DIAGNOSIS — Z79899 Other long term (current) drug therapy: Secondary | ICD-10-CM | POA: Insufficient documentation

## 2011-08-10 DIAGNOSIS — M7989 Other specified soft tissue disorders: Secondary | ICD-10-CM | POA: Insufficient documentation

## 2011-08-10 DIAGNOSIS — R609 Edema, unspecified: Secondary | ICD-10-CM | POA: Insufficient documentation

## 2011-08-10 DIAGNOSIS — I10 Essential (primary) hypertension: Secondary | ICD-10-CM | POA: Insufficient documentation

## 2011-08-10 HISTORY — DX: Unspecified atrial fibrillation: I48.91

## 2011-08-10 LAB — DIFFERENTIAL
Basophils Absolute: 0 10*3/uL (ref 0.0–0.1)
Basophils Relative: 0 % (ref 0–1)
Eosinophils Absolute: 0.1 10*3/uL (ref 0.0–0.7)
Eosinophils Relative: 1 % (ref 0–5)
Lymphocytes Relative: 34 % (ref 12–46)
Monocytes Absolute: 0.5 10*3/uL (ref 0.1–1.0)
Monocytes Relative: 5 % (ref 3–12)
Neutro Abs: 5.8 10*3/uL (ref 1.7–7.7)

## 2011-08-10 LAB — COMPREHENSIVE METABOLIC PANEL
AST: 28 U/L (ref 0–37)
Albumin: 3.9 g/dL (ref 3.5–5.2)
BUN: 12 mg/dL (ref 6–23)
Calcium: 9.6 mg/dL (ref 8.4–10.5)
Chloride: 101 mEq/L (ref 96–112)
Creatinine, Ser: 0.81 mg/dL (ref 0.50–1.10)
GFR calc Af Amer: >90 mL/min (ref >90)
GFR calc non Af Amer: 80 mL/min — ABNORMAL LOW (ref >90)
Glucose, Bld: 267 mg/dL — ABNORMAL HIGH (ref 70–99)
Total Bilirubin: 0.2 mg/dL — ABNORMAL LOW (ref 0.3–1.2)

## 2011-08-10 LAB — CBC
HCT: 36 % (ref 36.0–46.0)
Hemoglobin: 11.9 g/dL — ABNORMAL LOW (ref 12.0–15.0)
MCH: 23.1 pg — ABNORMAL LOW (ref 26.0–34.0)
MCHC: 33.1 g/dL (ref 30.0–36.0)
MCV: 69.9 fL — ABNORMAL LOW (ref 78.0–100.0)
RDW: 15.8 % — ABNORMAL HIGH (ref 11.5–15.5)
WBC: 9.7 10*3/uL (ref 4.0–10.5)

## 2011-08-10 LAB — TROPONIN I: Troponin I: <0.3 ng/mL (ref <0.30)

## 2011-08-10 LAB — PRO B NATRIURETIC PEPTIDE: Pro B Natriuretic peptide (BNP): 43.2 pg/mL (ref 0–125)

## 2011-08-10 MED ORDER — ACETAMINOPHEN 325 MG PO TABS
ORAL_TABLET | ORAL | Status: AC
  Filled 2011-08-10: qty 3

## 2011-08-10 MED ORDER — CYCLOBENZAPRINE HCL 10 MG PO TABS: 10.0000 mg | ORAL_TABLET | Freq: Three times a day (TID) | ORAL | Status: DC | PRN

## 2011-08-10 MED ORDER — CYCLOBENZAPRINE HCL 10 MG PO TABS
10.0000 mg | ORAL_TABLET | ORAL | Status: AC
  Administered 2011-08-10: 10 mg via ORAL
  Filled 2011-08-10: qty 1

## 2011-08-10 MED ORDER — FUROSEMIDE 10 MG/ML IJ SOLN
60.0000 mg | Freq: Once | INTRAMUSCULAR | Status: AC
  Administered 2011-08-10: 60 mg via INTRAVENOUS
  Filled 2011-08-10: qty 6

## 2011-08-10 MED ORDER — ACETAMINOPHEN 500 MG PO TABS
1000.0000 mg | ORAL_TABLET | ORAL | Status: AC
  Administered 2011-08-10: 975 mg via ORAL
  Filled 2011-08-10: qty 2

## 2011-08-10 MED ORDER — IBUPROFEN 200 MG PO TABS: 600.0000 mg | ORAL_TABLET | Freq: Four times a day (QID) | ORAL | Status: DC | PRN

## 2011-08-10 NOTE — Discharge Summary (Signed)
Physician Discharge Summary  Patient ID: [name] MRN: [id number] DOB/AGE: 1955/08/27 56 y.o.  Admit date: 08/10/2011 Discharge date: 08/10/2011  Admission Diagnoses: chest pain   Discharge Diagnoses: chest pain, unlikely non-cardiac  Active Problems:  * No active hospital problems. *    Discharged Condition: stable  Hospital Course: Patient is a 56 YO African-American female with a recent admission and work-up for chest pain found to be non-cardiac according to the patient here for the same.  Chest pain. Cardiac etiology seems less likely based on recent cardiac catheterization which was negative (per the patient), negative cardiac enzymes x 1 (but which was taken several hours after the onset of chest pain), non-worrisome ECG, reproducible chest pain with palpation. The patient was discussed with attending Dr. Oda Cogan who agreed that outpatient management was appropriate. Will flag front desk at patient's PCP office to schedule prompt outpatient appointment. I have also asked patient to call the clinic tomorrow for follow-up of her chest pain along with the following issues below. Patient has an appointment with Dr. Sharyn Lull in the next few weeks, some time near the end of this month. I will ask our clinic staff to request records from Dr. Annitta Jersey office.   Hyperglycemia. Patient reports her sugars are usually in the 200s at home. She reports compliance with her oral hypoglycemics. I will order a HgbA1c now before she is discharged home. I will recommend her PCP follow-up on this issue as an outpatient.   Lower extremity edema. Likely due to venous insufficiency, although fluid overload may be contributing. Patient's blood pressures are elevated, however, she does not show signs of pleural effusion and her BNP is normal. Recommending compression stockings at this time.   Disposition. Patient will be discharged home with close outpatient follow-up.  Follow-up issues: 1. PCP:  follow-up on records requested from Dr. Annitta Jersey (cardiologist's office) 2. PCP: follow-up on management of diabetes and HgbA1c 3. PCP: follow-up on lower extremity edema  Consults: none  Discharge Exam: Blood pressure 146/84.  Disposition: Home or Self Care   Medication List  As of 08/10/2011  9:13 PM   ASK your doctor about these medications         amLODipine 10 MG tablet   Commonly known as: NORVASC   Take 1 tablet (10 mg total) by mouth daily.      aspirin EC 81 MG tablet      celecoxib 200 MG capsule   Commonly known as: CELEBREX      citalopram 20 MG tablet   Commonly known as: CELEXA      cloNIDine 0.2 MG tablet   Commonly known as: CATAPRES      clopidogrel 75 MG tablet   Commonly known as: PLAVIX      DULoxetine 30 MG capsule   Commonly known as: CYMBALTA      gabapentin 300 MG capsule   Commonly known as: NEURONTIN   Take 1 capsule (300 mg total) by mouth at bedtime.      glipiZIDE 2.5 MG 24 hr tablet   Commonly known as: GLUCOTROL XL   Take 1 tablet (2.5 mg total) by mouth daily. With breakfast      lisinopril-hydrochlorothiazide 20-25 MG per tablet   Commonly known as: PRINZIDE,ZESTORETIC      metFORMIN 1000 MG tablet   Commonly known as: GLUCOPHAGE      metoprolol succinate 25 MG 24 hr tablet   Commonly known as: TOPROL-XL   Take 1 tablet (25 mg total)  by mouth daily.      nitroGLYCERIN 0.4 MG SL tablet   Commonly known as: NITROSTAT      omeprazole 20 MG capsule   Commonly known as: PRILOSEC      simvastatin 20 MG tablet   Commonly known as: ZOCOR             Signed: OH PARK, Dashawn Golda 08/10/2011, 9:13 PM

## 2011-08-10 NOTE — ED Provider Notes (Signed)
History     CSN: [id number]  Arrival date & time 08/10/11  1645   First MD Initiated Contact with Patient 08/10/11 1749      Chief Complaint  Patient presents with  . Chest Pain    (Consider location/radiation/quality/duration/timing/severity/associated sxs/prior treatment) HPI The pt has multiple complaints. Mid chest pain with sob and dizziness. Swollen feet and ankles some lower back pain nauseated.  These symptoms the been resting over the last several weeks.  Patient is on multiple blood pressure medicines.  She has long-standing history of hypertension and atrial fibrillation.  She states that her lower extremity swelling is much worse since in the past.  She's not been to her primary care doctor's for several months.  Past Medical History  Diagnosis Date  . Hypertension   . Hypertension     Hypertensive urgency 05/2006  . 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 Campath-induced atrial fibrillation     Past Surgical History  Procedure Date  . Tonsilectomy, adenoidectomy, bilateral myringotomy and tubes age 50  . Dilation and curettage of uterus 1976  . Meniscus repair 03/09    right knee    Family History  Problem Relation Age of Onset  . Heart failure Mother   . Diabetes Father   . Hypertension Father   . Lung cancer Sister     History  Substance Use Topics  . Smoking status: Never Smoker   . Smokeless tobacco: Not on file  . Alcohol Use: No    OB History    Grav Para Term Preterm Abortions TAB SAB Ect Mult Living                  Review of Systems  All other systems reviewed and are negative.    Allergies  Propoxyphene n-acetaminophen  Home Medications   Current Outpatient Rx  Name Route Sig Dispense Refill  . AMLODIPINE BESYLATE 10 MG PO TABS Oral Take 1 tablet (10 mg total) by mouth daily. 30 tablet 11  . ASPIRIN EC 81 MG PO TBEC Oral Take 81 mg by mouth daily.      . CELECOXIB 200 MG PO CAPS Oral Take 200 mg  by mouth daily.      Marland Kitchen CITALOPRAM HYDROBROMIDE 20 MG PO TABS Oral Take 20 mg by mouth daily.      Marland Kitchen CLONIDINE HCL 0.2 MG PO TABS Oral Take 0.2 mg by mouth 2 (two) times daily.      Marland Kitchen CLOPIDOGREL BISULFATE 75 MG PO TABS Oral Take 75 mg by mouth every morning.      . DULOXETINE HCL 30 MG PO CPEP Oral Take 30 mg by mouth daily.      Marland Kitchen GABAPENTIN 300 MG PO CAPS Oral Take 1 capsule (300 mg total) by mouth at bedtime. 31 capsule 3  . GLIPIZIDE ER 2.5 MG PO TB24 Oral Take 1 tablet (2.5 mg total) by mouth daily. With breakfast 90 tablet 3  . LISINOPRIL-HYDROCHLOROTHIAZIDE 20-25 MG PO TABS Oral Take 1 tablet by mouth 2 (two) times daily.      Marland Kitchen METFORMIN HCL 1000 MG PO TABS Oral Take 500-1,000 mg by mouth 2 (two) times daily. Take one tab qAM & 1/2 tab qPM     . METOPROLOL SUCCINATE ER 25 MG PO TB24 Oral Take 1 tablet (25 mg total) by mouth daily. 60 tablet 11  . NITROGLYCERIN 0.4 MG SL SUBL Sublingual Place 0.4 mg under the tongue  every 5 (five) minutes as needed. For chest pain     . OMEPRAZOLE 20 MG PO CPDR Oral Take 20 mg by mouth at bedtime.      . CYCLOBENZAPRINE HCL 10 MG PO TABS Oral Take 1 tablet (10 mg total) by mouth 3 (three) times daily as needed for muscle spasms. 30 tablet 0  . IBUPROFEN 200 MG PO TABS Oral Take 3 tablets (600 mg total) by mouth every 6 (six) hours as needed for pain. 30 tablet 0  . SIMVASTATIN 20 MG PO TABS Oral Take 20 mg by mouth at bedtime.        BP 144/82  Pulse 104  Resp 20  SpO2 97%  Physical Exam  Nursing note and vitals reviewed. Constitutional: She is oriented to person, place, and time. She appears well-developed and well-nourished.  Non-toxic appearance. She appears distressed.  HENT:  Head: Normocephalic and atraumatic.  Eyes: Pupils are equal, round, and reactive to light.  Neck: Normal range of motion.  Cardiovascular: Normal rate and intact distal pulses.          Date: 08/10/2011  Rate: 98  Rhythm: normal sinus rhythm  QRS Axis: normal   Intervals: normal  ST/T Wave abnormalities: normal  Conduction Disutrbances:none:   Old EKG Reviewed: unchanged     Pulmonary/Chest: No respiratory distress.    Abdominal: Normal appearance. She exhibits no distension.  Musculoskeletal: Normal range of motion. She exhibits edema.       Edema is in both lower extremities 3-4+  Neurological: She is alert and oriented to person, place, and time. No cranial nerve deficit.  Skin: Skin is warm and dry. No rash noted. She is not diaphoretic.  Psychiatric: She has a normal mood and affect. Her behavior is normal.    ED Course  Procedures (including critical care time)  Labs Reviewed  CBC - Abnormal; Notable for the following:    RBC 5.15 (*)    Hemoglobin 11.9 (*)    MCV 69.9 (*)    MCH 23.1 (*)    RDW 15.8 (*)    All other components within normal limits  COMPREHENSIVE METABOLIC PANEL - Abnormal; Notable for the following:    Glucose, Bld 267 (*)    Total Bilirubin 0.2 (*)    GFR calc non Af Amer 80 (*)    All other components within normal limits  DIFFERENTIAL  TROPONIN I  PRO B NATRIURETIC PEPTIDE   Dg Chest 2 View  08/10/2011  *RADIOLOGY REPORT*  Clinical Data: Chest pain.  Shortness of breath.  History of hypertension and diabetes.  CHEST - 2 VIEW 08/10/2011:  Comparison: Portable chest x-ray 05/20/2011 and two-view chest x- ray 03/25/2011, 02/23/2011, 07/18/2008 [facility].  Findings: Suboptimal inspiration due to body habitus which accounts for crowded bronchovascular markings at the bases and accentuates the cardiac silhouette.  Taking this into account, cardiac silhouette upper normal in size.  Thoracic aorta tortuous, unchanged.  Hilar and mediastinal contours otherwise unremarkable. Lungs clear.  Pulmonary vascularity normal.  No pleural effusions. Degenerative changes involving the thoracic spine.  IMPRESSION: Suboptimal inspiration.  No acute cardiopulmonary disease.  Original Report Authenticated By: Arnell Sieving, [name]     1. Chest pain       MDM   Patient was seen by family practice residents.  Her followup will be scheduled for tomorrow.  At the present time her labs and x-rays are stable.  She appears stable to go home.  She is agreeable  to the plan.       Nelia Shi, MD 08/10/11 2230

## 2011-08-10 NOTE — Discharge Instructions (Signed)
Chest Pain (Nonspecific) It is often hard to give a specific diagnosis for the cause of chest pain. There is always a chance that your pain could be related to something serious, such as a heart attack or a blood clot in the lungs. You need to follow up with your caregiver for further evaluation. CAUSES   Heartburn.   Pneumonia or bronchitis.   Anxiety and stress.   Inflammation around your heart (pericarditis) or lung (pleuritis or pleurisy).   A blood clot in the lung.   A collapsed lung (pneumothorax). It can develop suddenly on its own (spontaneous pneumothorax) or from injury (trauma) to the chest.  The chest wall is composed of bones, muscles, and cartilage. Any of these can be the source of the pain.  The bones can be bruised by injury.   The muscles or cartilage can be strained by coughing or overwork.   The cartilage can be affected by inflammation and become sore (costochondritis).  DIAGNOSIS  Lab tests or other studies, such as X-rays, an EKG, stress testing, or cardiac imaging, may be needed to find the cause of your pain.  TREATMENT   Treatment depends on what may be causing your chest pain. Treatment may include:   Acid blockers for heartburn.   Anti-inflammatory medicine.   Pain medicine for inflammatory conditions.   Antibiotics if an infection is present.   You may be advised to change lifestyle habits. This includes stopping smoking and avoiding caffeine and chocolate.   You may be advised to keep your head raised (elevated) when sleeping. This reduces the chance of acid going backward from your stomach into your esophagus.   Most of the time, nonspecific chest pain will improve within 2 to 3 days with rest and mild pain medicine.  HOME CARE INSTRUCTIONS   If antibiotics were prescribed, take the full amount even if you start to feel better.   For the next few days, avoid physical activities that bring on chest pain. Continue physical activities as  directed.   Do not smoke cigarettes or drink alcohol until your symptoms are gone.   Only take over-the-counter or prescription medicine for pain, discomfort, or fever as directed by your caregiver.   Follow your caregiver's suggestions for further testing if your chest pain does not go away.   Keep any follow-up appointments you made. If you do not go to an appointment, you could develop lasting (chronic) problems with pain. If there is any problem keeping an appointment, you must call to reschedule.  SEEK MEDICAL CARE IF:   You think you are having problems from the medicine you are taking. Read your medicine instructions carefully.   Your chest pain does not go away, even after treatment.   You develop a rash with blisters on your chest.  SEEK IMMEDIATE MEDICAL CARE IF:   You have increased chest pain or pain that spreads to your arm, neck, jaw, back, or belly (abdomen).   You develop shortness of breath, an increasing cough, or you are coughing up blood.   You have severe back or abdominal pain, feel sick to your stomach (nauseous) or throw up (vomit).   You develop severe weakness, fainting, or chills.   You have an oral temperature above 102 F (38.9 C), not controlled by medicine.  THIS IS AN EMERGENCY. Do not wait to see if the pain will go away. Get medical help at once. Call your local emergency services (911 in [place] Do not drive yourself to   the hospital. MAKE SURE YOU:   Understand these instructions.   Will watch your condition.   Will get help right away if you are not doing well or get worse.  Document Released: 04/30/2005 Document Revised: 04/02/2011 Document Reviewed: 02/24/2008 Exit[employer] Patient Information 2012 [employer]. 

## 2011-08-10 NOTE — ED Notes (Signed)
The pt has multiple complaints.  Mid chest pain with sob and dizziness.  Swollen feet and ankles some lower back pain.  nauseated

## 2011-08-10 NOTE — Consult Note (Signed)
[name] is an 56 y.o. female.   PCP: Dr. Tye Savoy [facility]) Chief Complaint: chest pain, leg swelling HPI: Patient is a 56 YO African-American female presenting with the above.  Patient is having chest pain. Above her heart on the left-side. Feels like pressure. Annoying, painful at times.  Chest pain started yesterday. Patient took NTG at church this morning. It helped but when she got out of church, the pain came back. Also aleviated by sitting forward. Not exacerbated by exertion. She did have a similar chest pain in the past. Dr. Sharyn Lull did a cardiac catheterization (October 2012) and a stress test, and both tests came back negative. Associated symptoms include feeling winded. Eating does not exacerbate symptoms and patient does not report relief with Pepcid.   Her feet have also been swollen for 2 weeks. They went down a little bit recently but they are still swollen. Not sure what has caused the swelling to worsen. Reports compliance with home medications.   Also complaining of back muscle spasms.   Past Medical History  Diagnosis Date  . Hypertension   . Hypertension     Hypertensive urgency 05/2006  . 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 Campath-induced atrial fibrillation   T2 diabetes  Past Surgical History  Procedure Date  . Tonsilectomy, adenoidectomy, bilateral myringotomy and tubes age 61  . Dilation and curettage of uterus 1976  . Meniscus repair 03/09    right knee    Family History  Problem Relation Age of Onset  . Heart failure Mother   . Diabetes Father   . Hypertension Father   . Lung cancer Sister    Social History:  reports that she has never smoked. She does not have any smokeless tobacco history on file. She reports that she does not drink alcohol or use illicit drugs. Lives at home with her daughter Deanna Artis), baby daughter, sister Lynden Ang. Work: disabled.  Allergies:  Allergies  Allergen Reactions  .  Propoxyphene N-Acetaminophen Hives, Itching and Swelling    REACTION: Hallucinations    Medications Prior to Admission  Medication Dose Route Frequency Provider Last Rate Last Dose  . furosemide (LASIX) injection 60 mg  60 mg Intravenous Once Nelia Shi, MD   60 mg at 08/10/11 1936  . pneumococcal 23 valent vaccine (PNU-IMMUNE) injection 0.5 mL  0.5 mL Intramuscular Once Ivy de Con[employer]o       Medications Prior to Admission  Medication Sig Dispense Refill  . amLODipine (NORVASC) 10 MG tablet Take 1 tablet (10 mg total) by mouth daily.  30 tablet  11  . celecoxib (CELEBREX) 200 MG capsule Take 200 mg by mouth daily.        . citalopram (CELEXA) 20 MG tablet Take 20 mg by mouth daily.        . cloNIDine (CATAPRES) 0.2 MG tablet Take 0.2 mg by mouth 2 (two) times daily.        . DULoxetine (CYMBALTA) 30 MG capsule Take 30 mg by mouth daily.        Marland Kitchen gabapentin (NEURONTIN) 300 MG capsule Take 1 capsule (300 mg total) by mouth at bedtime.  31 capsule  3  . glipiZIDE (GLUCOTROL) 2.5 MG 24 hr tablet Take 1 tablet (2.5 mg total) by mouth daily. With breakfast  90 tablet  3  . lisinopril-hydrochlorothiazide (PRINZIDE,ZESTORETIC) 20-25 MG per tablet Take 1 tablet by mouth 2 (two) times daily.        Marland Kitchen  metFORMIN (GLUCOPHAGE) 1000 MG tablet Take 500-1,000 mg by mouth 2 (two) times daily. Take one tab qAM & 1/2 tab qPM       . metoprolol succinate (TOPROL-XL) 25 MG 24 hr tablet Take 1 tablet (25 mg total) by mouth daily.  60 tablet  11  . omeprazole (PRILOSEC) 20 MG capsule Take 20 mg by mouth at bedtime.        Marland Kitchen DISCONTD: celecoxib (CELEBREX) 200 MG capsule Take 1 capsule (200 mg total) by mouth daily.  30 capsule  11  . DISCONTD: citalopram (CELEXA) 20 MG tablet Take 1 tablet (20 mg total) by mouth daily.  7 tablet  0  . DISCONTD: omeprazole (PRILOSEC) 20 MG capsule Take 1 capsule (20 mg total) by mouth daily.  30 capsule  11  . simvastatin (ZOCOR) 20 MG tablet Take 20 mg by mouth at bedtime.         Marland Kitchen DISCONTD: simvastatin (ZOCOR) 20 MG tablet Take 1 tablet (20 mg total) by mouth at bedtime.  30 tablet  11    Results for orders placed during the hospital encounter of 08/10/11 (from the past 48 hour(s))  CBC     Status: Abnormal   Collection Time   08/10/11  5:29 PM      Component Value Range Comment   WBC 9.7  4.0 - 10.5 (K/uL)    RBC 5.15 (*) 3.87 - 5.11 (MIL/uL)    Hemoglobin 11.9 (*) 12.0 - 15.0 (g/dL)    HCT 16.1  09.6 - 04.5 (%)    MCV 69.9 (*) 78.0 - 100.0 (fL)    MCH 23.1 (*) 26.0 - 34.0 (pg)    MCHC 33.1  30.0 - 36.0 (g/dL)    RDW 40.9 (*) 81.1 - 15.5 (%)    Platelets 316  150 - 400 (K/uL)   DIFFERENTIAL     Status: Normal   Collection Time   08/10/11  5:29 PM      Component Value Range Comment   Neutrophils Relative 60  43 - 77 (%)    Neutro Abs 5.8  1.7 - 7.7 (K/uL)    Lymphocytes Relative 34  12 - 46 (%)    Lymphs Abs 3.3  0.7 - 4.0 (K/uL)    Monocytes Relative 5  3 - 12 (%)    Monocytes Absolute 0.5  0.1 - 1.0 (K/uL)    Eosinophils Relative 1  0 - 5 (%)    Eosinophils Absolute 0.1  0.0 - 0.7 (K/uL)    Basophils Relative 0  0 - 1 (%)    Basophils Absolute 0.0  0.0 - 0.1 (K/uL)   COMPREHENSIVE METABOLIC PANEL     Status: Abnormal   Collection Time   08/10/11  5:29 PM      Component Value Range Comment   Sodium 137  135 - 145 (mEq/L)    Potassium 4.0  3.5 - 5.1 (mEq/L)    Chloride 101  96 - 112 (mEq/L)    CO2 22  19 - 32 (mEq/L)    Glucose, Bld 267 (*) 70 - 99 (mg/dL)    BUN 12  6 - 23 (mg/dL)    Creatinine, Ser 9.14  0.50 - 1.10 (mg/dL)    Calcium 9.6  8.4 - 10.5 (mg/dL)    Total Protein 7.9  6.0 - 8.3 (g/dL)    Albumin 3.9  3.5 - 5.2 (g/dL)    AST 28  0 - 37 (U/L) HEMOLYSIS AT THIS LEVEL MAY AFFECT  RESULT   ALT 31  0 - 35 (U/L)    Alkaline Phosphatase 85  39 - 117 (U/L)    Total Bilirubin 0.2 (*) 0.3 - 1.2 (mg/dL)    GFR calc non Af Amer 80 (*) >90 (mL/min)    GFR calc Af Amer >90  >90 (mL/min)   TROPONIN I     Status: Normal   Collection Time   08/10/11   5:46 PM      Component Value Range Comment   Troponin I <0.30  <0.30 (ng/mL)   PRO B NATRIURETIC PEPTIDE     Status: Normal   Collection Time   08/10/11  5:50 PM      Component Value Range Comment   Pro B Natriuretic peptide (BNP) 43.2  0 - 125 (pg/mL)    Dg Chest 2 View  08/10/2011  *RADIOLOGY REPORT*  Clinical Data: Chest pain.  Shortness of breath.  History of hypertension and diabetes.  CHEST - 2 VIEW 08/10/2011:  Comparison: Portable chest x-ray 05/20/2011 and two-view chest x- ray 03/25/2011, 02/23/2011, 07/18/2008 [facility].  Findings: Suboptimal inspiration due to body habitus which accounts for crowded bronchovascular markings at the bases and accentuates the cardiac silhouette.  Taking this into account, cardiac silhouette upper normal in size.  Thoracic aorta tortuous, unchanged.  Hilar and mediastinal contours otherwise unremarkable. Lungs clear.  Pulmonary vascularity normal.  No pleural effusions. Degenerative changes involving the thoracic spine.  IMPRESSION: Suboptimal inspiration.  No acute cardiopulmonary disease.  Original Report Authenticated By: Arnell Sieving, [name]    ROS Per HPI with inclusion of following: Gen: no fevers, chills GI: no nausea/vomiting, abdominal pain Pulm: no difficulty breathing at this time  Blood pressure 146/84.  Physical Exam  Gen: NAD, morbidly obese, conversant, accompanied by daughter Psych: engaged, appropriate, not depressed or anxious appearing CV: RRR, no m/r/g Chest: tenderness to palpation over left upper chest Pulm: no increased WOB, CTAB without w/r/r Abd: obese, NABS, soft, NT Ext: 2+ pitting pretibial edema half-way up tibia bilaterally; 2+ pedal pulses bilaterally Skin: no skin breakdown in lower legs Neuro: grossly intact, including sensation and motor; alert and oriented  Assessment/Plan Patient is a 56 YO African-American female with a recent admission and work-up for chest pain found to be non-cardiac  according to the patient here for the same.  Chest pain. Cardiac etiology seems less likely based on recent cardiac catheterization which was negative (per the patient), negative cardiac enzymes x 1 (but which was taken several hours after the onset of chest pain), non-worrisome ECG, reproducible chest pain with palpation. The patient was discussed with attending Dr. Oda Cogan who agreed that outpatient management was appropriate. Will flag front desk at patient's PCP office to schedule prompt outpatient appointment. I have also asked patient to call the clinic tomorrow for follow-up of her chest pain along with the following issues below. Patient has an appointment with Dr. Sharyn Lull in the next few weeks, some time near the end of this month. I will ask our clinic staff to request records from Dr. Annitta Jersey office. Recommending ibuprofen and flexeril for possible musculoskeletal pain.  Hyperglycemia. Patient reports her sugars are usually in the 200s at home. She reports compliance with her oral hypoglycemics. I will order a HgbA1c now before she is discharged home. I will recommend her PCP follow-up on this issue as an outpatient.   Lower extremity edema. Likely due to venous insufficiency, although fluid overload may be contributing. Patient's blood pressures are elevated,  however, she does not show signs of pleural effusion and her BNP is normal. Recommending compression stockings at this time.   Disposition. Patient will be discharged home with close outpatient follow-up.   OH [name], Carla Bruster 08/10/2011, 8:33 PM

## 2011-08-10 NOTE — ED Notes (Signed)
Pt placed on monitor; family at bedside 

## 2011-08-10 NOTE — ED Notes (Signed)
Assumed care of pt.  Pt hooked back up to monitor.  Reports some slight (L) sided CP.  Updated on POC.  No distress noted.

## 2011-08-11 NOTE — Consult Note (Signed)
Family Medicine Teaching Service Attending Note  I discussed patient [name]  with Dr. Madolyn Frieze and reviewed their note for today.  I agree with their assessment and plan.

## 2011-08-11 NOTE — Discharge Summary (Signed)
I have reviewed this discharge summary and agree.    

## 2011-08-12 ENCOUNTER — Encounter: Payer: Self-pay | Admitting: Family Medicine

## 2011-08-12 ENCOUNTER — Ambulatory Visit (INDEPENDENT_AMBULATORY_CARE_PROVIDER_SITE_OTHER): Payer: Medicaid Other | Admitting: Family Medicine

## 2011-08-12 DIAGNOSIS — R609 Edema, unspecified: Secondary | ICD-10-CM

## 2011-08-12 DIAGNOSIS — I10 Essential (primary) hypertension: Secondary | ICD-10-CM

## 2011-08-12 DIAGNOSIS — R079 Chest pain, unspecified: Secondary | ICD-10-CM

## 2011-08-12 DIAGNOSIS — E119 Type 2 diabetes mellitus without complications: Secondary | ICD-10-CM

## 2011-08-12 LAB — POCT GLYCOSYLATED HEMOGLOBIN (HGB A1C): Hemoglobin A1C: 8

## 2011-08-12 MED ORDER — METOPROLOL SUCCINATE ER 50 MG PO TB24: 50.0000 mg | ORAL_TABLET | Freq: Every day | ORAL | Status: DC

## 2011-08-12 MED ORDER — METFORMIN HCL 1000 MG PO TABS: 1000.0000 mg | ORAL_TABLET | Freq: Two times a day (BID) | ORAL | Status: DC

## 2011-08-12 MED ORDER — GLIPIZIDE ER 5 MG PO TB24: 5.0000 mg | ORAL_TABLET | Freq: Every day | ORAL | Status: DC

## 2011-08-12 NOTE — Assessment & Plan Note (Deleted)
Continued increase in a1c, above goal at 8% today.  Will increase metformin from 1500 total daily dose to 200 mg total daily dose.  Will increase glipizide XR from 2.5 to 5 mg daily.

## 2011-08-12 NOTE — Patient Instructions (Signed)
Increase metformin to 1000 mg twice a day  Increase glipizide to 5 mg daily  Increase metoprolol to 50 mg daily  Make follow-up with Dr. Tye Savoy in 2-3 weeks

## 2011-08-12 NOTE — Progress Notes (Signed)
  Subjective:    Patient ID: [name], female    DOB: [birth year], 56 y.o.   MRN: [id number]  HPIHere for ER follow-up for chest pain.  Was discharged from ER by [facility]PTS.  Asked to follow-up on Chest pain, DM, LE edema  Chest pain:  Has not had since she was in ER.  Has not had to take NTG, has it available with her.  DIABETES  Taking and tolerating: yes, no GI side effects with metformin.  Also taking glipizide Fasting blood sugars:n/a Hypoglycemic symptoms: no  Diabetic Labs:  Lab Results  Component Value Date   HGBA1C 8.0 08/12/2011   HGBA1C 7.6* 05/20/2011   HGBA1C 7.1 04/28/2011   Lab Results  Component Value Date   LDLCALC 67 03/26/2011   CREATININE 0.81 08/10/2011   Last microalbumin: No results found for this basename: MICROALBUR, [id number]      LE edema: patient states much improved since receiving lasix in ER.  No pain.   HYPERTENSION  BP Readings from Last 3 Encounters:  08/12/11 190/103  08/10/11 144/82  05/27/11 145/93   BP recheck 173/99  Hypertension ROS: taking medications as instructed, no medication side effects noted, no chest pain on exertion and no intermittent claudication symptoms.      Review of Systemssee HPI     Objective:   Physical Exam GEN: Alert & Oriented, No acute distress CV:  Regular Rate & Rhythm, no murmur Respiratory:  Normal work of breathing, CTAB Abd:  + BS, soft, no tenderness to palpation Ext: trace pretibial edema        Assessment & Plan:

## 2011-08-12 NOTE — Assessment & Plan Note (Addendum)
Continued increase in a1c, above goal at 8% today.  Will increase metformin from 1500 total daily dose to 2000 mg total daily dose.  Will increase glipizide XR from 2.5 to 5 mg daily.

## 2011-08-12 NOTE — Assessment & Plan Note (Addendum)
Poorly controlled.  She may have not been taking metoprolol but states compliant with amlodipine, lisinopril/hctz and no rebound from clonidine today.  Will increase metoprolol, follow-up with PCP in 2-3 weeks

## 2011-08-12 NOTE — Assessment & Plan Note (Signed)
Minimal LE edema today.  Advised elevated, walking.  Has compression hose, but not needing to wear at this time.

## 2011-08-12 NOTE — Assessment & Plan Note (Signed)
No further chest pain.  Not likely cardiac given recent stress test results.  Has follow-up with cardiology in 2 weeks.

## 2011-08-18 ENCOUNTER — Encounter: Payer: Self-pay | Admitting: Family Medicine

## 2011-08-18 ENCOUNTER — Ambulatory Visit (INDEPENDENT_AMBULATORY_CARE_PROVIDER_SITE_OTHER): Payer: Medicaid Other | Admitting: Family Medicine

## 2011-08-18 VITALS — BP 169/98 | HR 103 | Temp 97.9°F | Ht 62.0 in | Wt 281.7 lb

## 2011-08-18 DIAGNOSIS — Z8673 Personal history of transient ischemic attack (TIA), and cerebral infarction without residual deficits: Secondary | ICD-10-CM

## 2011-08-18 DIAGNOSIS — M6250 Muscle wasting and atrophy, not elsewhere classified, unspecified site: Secondary | ICD-10-CM

## 2011-08-18 DIAGNOSIS — R269 Unspecified abnormalities of gait and mobility: Secondary | ICD-10-CM

## 2011-08-18 DIAGNOSIS — R5381 Other malaise: Secondary | ICD-10-CM

## 2011-08-18 NOTE — Patient Instructions (Signed)
I will refer you to Physical and Occupational Therapy. Schedule an appointment with Dr. Gerilyn Pilgrim, Diabetes and Nutrition Education. Schedule a follow up with me in 4-6 weeks to discuss PT results.

## 2011-08-18 NOTE — Assessment & Plan Note (Signed)
No residual unilateral weakness after stroke, but patient does become SOB with over exertion. Will follow up PT/OT recommendations.

## 2011-08-18 NOTE — Assessment & Plan Note (Signed)
Patient wants prescription for a scooter from the [employer]. Will order PT/OT referral to help evaluate gait and mobility. If PT recommends scooter, then I will write a Rx.

## 2011-08-18 NOTE — Progress Notes (Signed)
  Subjective:    Patient ID: [name], female    DOB: March 13, 1956, 56 y.o.   MRN: [id number]  HPI  Patient presents to clinic for mobility exam - wants a Rx for scooter from [employer].  Patient currently walks with a walker ever since CVA in 2010.  She does not smoke, no history of COPD, but she does become short of breath after walking short distances (from room to room in her house).  Patient is obese and continues to gain weight.  ADLs - able to cook for herself, dress, go to the bathroom, but needs assistance getting in and out of shower.  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  Patient stays at home mostly, but daughter comes to visit.  Patient wants her own scooter so she does not have to borrow one from stores.  Review of Systems  Per HPI    Objective:   Physical Exam  Constitutional: She is oriented to person, place, and time. No distress.  Cardiovascular: Normal rate and regular rhythm.   Pulmonary/Chest: Effort normal and breath sounds normal. She has no wheezes. She has no rales.  Musculoskeletal: She exhibits no edema and no tenderness.       4/5 strength L upper and lower extremity, 5/5 strength on R, walks with walker, hunched over, no limping.  Normal sensation.  Neurological: She is alert and oriented to person, place, and time. No cranial nerve deficit. Coordination abnormal.  Skin: Skin is warm and dry.           Assessment & Plan:

## 2011-09-09 ENCOUNTER — Telehealth: Payer: Self-pay | Admitting: *Deleted

## 2011-09-09 NOTE — Telephone Encounter (Signed)
Patient brings in form for new diabetic shoes . Form placed in MD box.

## 2011-09-19 NOTE — Telephone Encounter (Signed)
Form for Diabetic shoes completed by Dr. Tye Savoy and mailed as requested.  [name]

## 2011-10-13 ENCOUNTER — Emergency Department (HOSPITAL_COMMUNITY)
Admission: EM | Admit: 2011-10-13 | Discharge: 2011-10-13 | Disposition: A | Discharge status: Home / Self Care | Payer: Medicaid Other | Attending: Emergency Medicine | Admitting: Emergency Medicine

## 2011-10-13 ENCOUNTER — Encounter (HOSPITAL_COMMUNITY): Payer: Self-pay

## 2011-10-13 DIAGNOSIS — Z79899 Other long term (current) drug therapy: Secondary | ICD-10-CM | POA: Insufficient documentation

## 2011-10-13 DIAGNOSIS — I10 Essential (primary) hypertension: Secondary | ICD-10-CM | POA: Insufficient documentation

## 2011-10-13 DIAGNOSIS — M79609 Pain in unspecified limb: Secondary | ICD-10-CM | POA: Insufficient documentation

## 2011-10-13 DIAGNOSIS — M654 Radial styloid tenosynovitis [de Quervain]: Secondary | ICD-10-CM

## 2011-10-13 DIAGNOSIS — Z8673 Personal history of transient ischemic attack (TIA), and cerebral infarction without residual deficits: Secondary | ICD-10-CM | POA: Insufficient documentation

## 2011-10-13 MED ORDER — PREDNISONE 20 MG PO TABS: 20.0000 mg | ORAL_TABLET | Freq: Every day | ORAL | Status: AC

## 2011-10-13 MED ORDER — HYDROCODONE-ACETAMINOPHEN 5-325 MG PO TABS: 1.0000 | ORAL_TABLET | Freq: Four times a day (QID) | ORAL | Status: AC | PRN

## 2011-10-13 NOTE — Progress Notes (Signed)
Orthopedic Tech Progress Note Patient Details:  [name] 06-Jul-1956 [id number]  Type of Splint: Thumb spica Splint Interventions: Application    Cammer, Mickie Bail 10/13/2011, 10:22 AM

## 2011-10-13 NOTE — ED Provider Notes (Signed)
Medical screening examination/treatment/procedure(s) were performed by non-physician practitioner and as supervising physician I was immediately available for consultation/collaboration.   Dejion Grillo, MD 10/13/11 1536 

## 2011-10-13 NOTE — ED Provider Notes (Signed)
History     CSN: [id number]  Arrival date & time 10/13/11  9562   First MD Initiated Contact with Patient 10/13/11 [phone]      Chief Complaint  Patient presents with  . Hand Pain    (Consider location/radiation/quality/duration/timing/severity/associated sxs/prior treatment) HPI The patient presents the emergency department with a two-week history of pain in her left thumb along with second and third digit discomfort.  Patient states that certain movements make it worse.  She states the pain will radiate up to her elbow from her thumb.  Patient denies taking any medications to relieve the symptoms.  Patient denies any previous injury to the arm or thumb or hand.  The patient is advised me that she does not have any chest pain, shortness of breath, weakness, nausea/vomiting, numbness or weakness.        Past Medical History  Diagnosis Date  . Hypertension   . Hypertension     Hypertensive urgency 05/2006  . 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 Atrial fibrillation     Past Surgical History  Procedure Date  . Tonsilectomy, adenoidectomy, bilateral myringotomy and tubes age 14  . Dilation and curettage of uterus 1976  . Meniscus repair 03/09    right knee    Family History  Problem Relation Age of Onset  . Heart failure Mother   . Diabetes Father   . Hypertension Father   . Lung cancer Sister     History  Substance Use Topics  . Smoking status: Never Smoker   . Smokeless tobacco: Not on file  . Alcohol Use: No    OB History    Grav Para Term Preterm Abortions TAB SAB Ect Mult Living                  Review of Systems All pertinent positives and negatives reviewed in the history of present illness  Allergies  Propoxyphene n-acetaminophen  Home Medications   Current Outpatient Rx  Name Route Sig Dispense Refill  . AMLODIPINE BESYLATE 10 MG PO TABS Oral Take 1 tablet (10 mg total) by mouth daily. 30 tablet 11  . ASPIRIN  EC 81 MG PO TBEC Oral Take 81 mg by mouth daily.      . CELECOXIB 200 MG PO CAPS Oral Take 200 mg by mouth daily.      Marland Kitchen CITALOPRAM HYDROBROMIDE 20 MG PO TABS Oral Take 20 mg by mouth daily.      Marland Kitchen CLONIDINE HCL 0.2 MG PO TABS Oral Take 0.2 mg by mouth 2 (two) times daily.      Marland Kitchen CLOPIDOGREL BISULFATE 75 MG PO TABS Oral Take 75 mg by mouth every morning.      . DULOXETINE HCL 30 MG PO CPEP Oral Take 30 mg by mouth daily.      Marland Kitchen GABAPENTIN 300 MG PO CAPS Oral Take 1 capsule (300 mg total) by mouth at bedtime. 31 capsule 3  . GLIPIZIDE ER 5 MG PO TB24 Oral Take 5 mg by mouth daily. With breakfast    . LISINOPRIL-HYDROCHLOROTHIAZIDE 20-25 MG PO TABS Oral Take 1 tablet by mouth 2 (two) times daily.      Marland Kitchen METFORMIN HCL 1000 MG PO TABS Oral Take 1,000 mg by mouth 2 (two) times daily.    Marland Kitchen METOPROLOL SUCCINATE ER 50 MG PO TB24 Oral Take 50 mg by mouth daily.    Marland Kitchen NITROGLYCERIN 0.4 MG SL SUBL Sublingual Place 0.4  mg under the tongue every 5 (five) minutes as needed. For chest pain     . OMEPRAZOLE 20 MG PO CPDR Oral Take 20 mg by mouth at bedtime.      Marland Kitchen SIMVASTATIN 20 MG PO TABS Oral Take 20 mg by mouth at bedtime.      . CYCLOBENZAPRINE HCL 10 MG PO TABS Oral Take 1 tablet (10 mg total) by mouth 3 (three) times daily as needed for muscle spasms. 30 tablet 0  . IBUPROFEN 200 MG PO TABS Oral Take 3 tablets (600 mg total) by mouth every 6 (six) hours as needed for pain. 30 tablet 0    BP 182/105  Pulse 97  Temp(Src) 97.9 F (36.6 C) (Oral)  Resp 16  SpO2 97%  Physical Exam  Nursing note and vitals reviewed. Constitutional: She appears well-developed and well-nourished. No distress.  HENT:  Head: Normocephalic and atraumatic.  Cardiovascular: Normal rate and normal heart sounds.  Exam reveals no friction rub.   No murmur heard. Pulmonary/Chest: Effort normal and breath sounds normal.  Musculoskeletal:       Left hand: She exhibits tenderness. She exhibits normal capillary refill, no  deformity, no laceration and no swelling. normal sensation noted. Normal strength noted.       Hands:   ED Course  Procedures (including critical care time)   Based on the patient physical exam and history of present illness the patient most likely has tendinitis.  Her symptoms are consistent with that based on the physical exam findings.  Patient is advised she will need followup with orthopedics as needed.  Also advised her to her primary care Dr. for recheck.  Told her to use ice and heat on the thumb and hand area.  The patient did not get an x-ray did not affect it there is no trauma and at this time no signs of bony injury.  Patient is to return here for any worsening in her condition.       MDM          Carlyle Dolly, PA-C 10/13/11 [phone]

## 2011-10-13 NOTE — ED Notes (Signed)
Pain began  About 2 weeks ago,  3 fingers of her lt. Hand and the pain radiates into her lt. Tricep.  Denies any injuries, She denies any sob or nausea.

## 2011-10-13 NOTE — Discharge Instructions (Signed)
Followup with the orthopedist provided as needed.  You will need to use ice and heat on your hand and thumb.  Return here as needed.

## 2011-10-24 ENCOUNTER — Encounter: Payer: Self-pay | Admitting: Family Medicine

## 2011-10-28 ENCOUNTER — Encounter: Payer: Medicaid Other | Admitting: Occupational Therapy

## 2011-10-31 ENCOUNTER — Other Ambulatory Visit: Payer: Self-pay | Admitting: Cardiology

## 2011-11-07 ENCOUNTER — Ambulatory Visit: Payer: Medicare Other | Attending: Family Medicine | Admitting: Rehabilitative and Restorative Service Providers"

## 2011-11-07 DIAGNOSIS — IMO0001 Reserved for inherently not codable concepts without codable children: Secondary | ICD-10-CM | POA: Insufficient documentation

## 2011-11-07 DIAGNOSIS — M542 Cervicalgia: Secondary | ICD-10-CM | POA: Insufficient documentation

## 2011-11-07 DIAGNOSIS — M2569 Stiffness of other specified joint, not elsewhere classified: Secondary | ICD-10-CM | POA: Insufficient documentation

## 2011-11-11 ENCOUNTER — Other Ambulatory Visit: Payer: Self-pay | Admitting: Cardiology

## 2011-12-20 ENCOUNTER — Emergency Department (HOSPITAL_COMMUNITY): Payer: Medicare Other

## 2011-12-20 ENCOUNTER — Encounter (HOSPITAL_COMMUNITY): Payer: Self-pay | Admitting: *Deleted

## 2011-12-20 ENCOUNTER — Emergency Department (HOSPITAL_COMMUNITY)
Admission: EM | Admit: 2011-12-20 | Discharge: 2011-12-21 | Disposition: A | Discharge status: Home / Self Care | Payer: Medicare Other | Attending: Emergency Medicine | Admitting: Emergency Medicine

## 2011-12-20 DIAGNOSIS — I10 Essential (primary) hypertension: Secondary | ICD-10-CM | POA: Insufficient documentation

## 2011-12-20 DIAGNOSIS — I4891 Unspecified atrial fibrillation: Secondary | ICD-10-CM | POA: Insufficient documentation

## 2011-12-20 DIAGNOSIS — E7800 Pure hypercholesterolemia, unspecified: Secondary | ICD-10-CM | POA: Insufficient documentation

## 2011-12-20 DIAGNOSIS — I498 Other specified cardiac arrhythmias: Secondary | ICD-10-CM | POA: Insufficient documentation

## 2011-12-20 DIAGNOSIS — Z79899 Other long term (current) drug therapy: Secondary | ICD-10-CM | POA: Insufficient documentation

## 2011-12-20 DIAGNOSIS — R071 Chest pain on breathing: Secondary | ICD-10-CM | POA: Insufficient documentation

## 2011-12-20 DIAGNOSIS — R0789 Other chest pain: Secondary | ICD-10-CM

## 2011-12-20 DIAGNOSIS — E119 Type 2 diabetes mellitus without complications: Secondary | ICD-10-CM | POA: Insufficient documentation

## 2011-12-20 DIAGNOSIS — Z8673 Personal history of transient ischemic attack (TIA), and cerebral infarction without residual deficits: Secondary | ICD-10-CM | POA: Insufficient documentation

## 2011-12-20 HISTORY — DX: Pure hypercholesterolemia, unspecified: E78.00

## 2011-12-20 LAB — BASIC METABOLIC PANEL
CO2: 25 mEq/L (ref 19–32)
Calcium: 9.2 mg/dL (ref 8.4–10.5)
Creatinine, Ser: 0.73 mg/dL (ref 0.50–1.10)
GFR calc Af Amer: >90 mL/min (ref >90)
GFR calc non Af Amer: >90 mL/min (ref >90)

## 2011-12-20 LAB — CBC
Hemoglobin: 11.8 g/dL — ABNORMAL LOW (ref 12.0–15.0)
MCH: 22.2 pg — ABNORMAL LOW (ref 26.0–34.0)
MCV: 69.1 fL — ABNORMAL LOW (ref 78.0–100.0)
Platelets: 291 10*3/uL (ref 150–400)
RDW: 16 % — ABNORMAL HIGH (ref 11.5–15.5)

## 2011-12-20 MED ORDER — ASPIRIN 81 MG PO CHEW
324.0000 mg | CHEWABLE_TABLET | Freq: Once | ORAL | Status: AC
  Administered 2011-12-20: 324 mg via ORAL
  Filled 2011-12-20: qty 4

## 2011-12-20 MED ORDER — GI COCKTAIL ~~LOC~~
30.0000 mL | Freq: Once | ORAL | Status: AC
  Administered 2011-12-21: 30 mL via ORAL
  Filled 2011-12-20: qty 30

## 2011-12-20 NOTE — ED Notes (Signed)
Patient states that she started experiencing left sided chest pain about 30 minutes ago.  Pain is worse when she lays down but when she is sitting up the there is no pain.  Patient denies n/v and shortness of breath.  Patient describes pain as squeezing in nature.  Patient did not take any medication for pain, states that she is out of her Nitro tabs

## 2011-12-20 NOTE — ED Provider Notes (Signed)
History     CSN: [id number]  Arrival date & time 12/20/11  2116   First MD Initiated Contact with Patient 12/20/11 2155      Chief Complaint  Patient presents with  . Chest Pain    (Consider location/radiation/quality/duration/timing/severity/associated sxs/prior treatment) HPI Comments: Patient comes in today with a chief complaint of chest pain.  She reports that sitting up decreases the chest pain and the chest pain increases when she turns to her right side.  She describes the pain as a dull pressure.  Pain does not radiate.  Pain not associated with nausea, vomiting, diaphoresis, SOB, dizziness, or lightheadedness.  She reports that she has had similar pain in the past and was told that her pain was musculoskeletal.  She has been seen in the ED several times in the past for similar pain.  She had a nonobstructive cardiac cath in October 2012.  She has not taken anything for her symptoms.  PMH significant for hypertension, hyperlipidemia, and DM.    The history is provided by the patient.    Past Medical History  Diagnosis Date  . Hypertension   . Hypertension     Hypertensive urgency 05/2006  . 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 Atrial fibrillation   . Diabetes mellitus   . Hypercholesteremia     Past Surgical History  Procedure Date  . Tonsilectomy, adenoidectomy, bilateral myringotomy and tubes age 25  . Dilation and curettage of uterus 1976  . Meniscus repair 03/09    right knee    Family History  Problem Relation Age of Onset  . Heart failure Mother   . Diabetes Father   . Hypertension Father   . Lung cancer Sister     History  Substance Use Topics  . Smoking status: Never Smoker   . Smokeless tobacco: Not on file  . Alcohol Use: No    OB History    Grav Para Term Preterm Abortions TAB SAB Ect Mult Living                  Review of Systems  Constitutional: Negative for fever and chills.  HENT: Negative for neck  pain and neck stiffness.   Respiratory: Negative for cough, shortness of breath and wheezing.   Cardiovascular: Positive for chest pain. Negative for leg swelling.  Gastrointestinal: Negative for nausea, vomiting and abdominal pain.  Skin: Negative for rash.  Neurological: Negative for dizziness, syncope and light-headedness.  All other systems reviewed and are negative.    Allergies  Propoxyphene-acetaminophen  Home Medications   Current Outpatient Rx  Name Route Sig Dispense Refill  . AMLODIPINE BESYLATE 10 MG PO TABS Oral Take 1 tablet (10 mg total) by mouth daily. 30 tablet 11  . ASPIRIN EC 81 MG PO TBEC Oral Take 81 mg by mouth daily.      . CELECOXIB 200 MG PO CAPS Oral Take 200 mg by mouth daily.      Marland Kitchen CITALOPRAM HYDROBROMIDE 20 MG PO TABS Oral Take 20 mg by mouth daily.      Marland Kitchen CLONIDINE HCL 0.2 MG PO TABS Oral Take 0.2 mg by mouth 2 (two) times daily.    Marland Kitchen CLOPIDOGREL BISULFATE 75 MG PO TABS Oral Take 75 mg by mouth every morning.      . DULOXETINE HCL 30 MG PO CPEP Oral Take 30 mg by mouth daily.      Marland Kitchen GABAPENTIN 300 MG PO CAPS  Oral Take 1 capsule (300 mg total) by mouth at bedtime. 31 capsule 3  . METFORMIN HCL 1000 MG PO TABS Oral Take 1,000 mg by mouth 2 (two) times daily.    Marland Kitchen METOPROLOL SUCCINATE ER 50 MG PO TB24 Oral Take 50 mg by mouth daily.    Marland Kitchen OMEPRAZOLE 20 MG PO CPDR Oral Take 20 mg by mouth at bedtime.      Marland Kitchen ROSUVASTATIN CALCIUM 10 MG PO TABS Oral Take 10 mg by mouth daily.    Marland Kitchen GLIPIZIDE ER 5 MG PO TB24 Oral Take 5 mg by mouth daily. With breakfast    . IBUPROFEN 200 MG PO TABS Oral Take 3 tablets (600 mg total) by mouth every 6 (six) hours as needed for pain. 30 tablet 0  . LISINOPRIL-HYDROCHLOROTHIAZIDE 20-25 MG PO TABS Oral Take 1 tablet by mouth 2 (two) times daily.      Marland Kitchen NITROGLYCERIN 0.4 MG SL SUBL Sublingual Place 0.4 mg under the tongue every 5 (five) minutes as needed. For chest pain       BP 164/95  Pulse 75  Temp(Src) 98.6 F (37 C) (Oral)   Resp 16  SpO2 100%  Physical Exam  Nursing note and vitals reviewed. Constitutional: She appears well-developed and well-nourished. No distress.  HENT:  Head: Normocephalic and atraumatic.  Mouth/Throat: Oropharynx is clear and moist.  Eyes: EOM are normal. Pupils are equal, round, and reactive to light.  Neck: Normal range of motion. Neck supple.  Cardiovascular: Normal rate, regular rhythm, normal heart sounds and intact distal pulses.   Pulmonary/Chest: Effort normal and breath sounds normal. No respiratory distress. She has no wheezes. She has no rales. She exhibits no tenderness.  Abdominal: Soft. Bowel sounds are normal. There is no tenderness.  Musculoskeletal: Normal range of motion.  Neurological: She is alert.  Skin: Skin is warm and dry. She is not diaphoretic.  Psychiatric: She has a normal mood and affect.    ED Course  Procedures (including critical care time)  Labs Reviewed  CBC - Abnormal; Notable for the following:    RBC 5.31 (*)    Hemoglobin 11.8 (*)    MCV 69.1 (*)    MCH 22.2 (*)    RDW 16.0 (*)    All other components within normal limits  BASIC METABOLIC PANEL - Abnormal; Notable for the following:    Glucose, Bld 230 (*)    All other components within normal limits  TROPONIN I  DIFFERENTIAL   Dg Chest 2 View  12/20/2011  *RADIOLOGY REPORT*  Clinical Data: Chest pain, pressure.  CHEST - 2 VIEW  Comparison: 08/10/2011  Findings: Cardiomediastinal contours are within normal limits. Lungs are predominately clear, with mild crowding on the lateral view.  No focal consolidation, pleural effusion or pneumothorax. Multilevel degenerative changes.  No acute osseous finding.  IMPRESSION: No radiographic evidence for acute cardiopulmonary process.  Original Report Authenticated By: Waneta Martins, [name]     No diagnosis found.    Date: 12/20/2011  Rate: 70  Rhythm: sinus arrhythmia  QRS Axis: normal  Intervals: normal  ST/T Wave abnormalities:  normal  Conduction Disutrbances:none  Narrative Interpretation:   Old EKG Reviewed: unchanged  Patient discussed with Dr. Bebe Shaggy who also evaluated the patient.  MDM  Patient presenting with chest pain that becomes better with certain positions.  No associated symptoms with the chest pain.  Recent cardiac cath in October 2012 that was nonobstructive.  No acute changes on EKG.  CXR negative.  Initial troponin negative.  3 hour troponin pending.  Dr. Nicanor Alcon will follow up with 3 hour troponin.        Pascal Lux [place], PA-C 12/21/11 0201

## 2011-12-21 LAB — DIFFERENTIAL
Basophils Absolute: 0 10*3/uL (ref 0.0–0.1)
Lymphocytes Relative: 42 % (ref 12–46)
Lymphs Abs: 3.7 10*3/uL (ref 0.7–4.0)
Monocytes Absolute: 0.4 10*3/uL (ref 0.1–1.0)
Neutrophils Relative %: 52 % (ref 43–77)

## 2011-12-21 LAB — TROPONIN I
Troponin I: <0.3 ng/mL (ref <0.30)
Troponin I: <0.3 ng/mL (ref <0.30)

## 2011-12-21 MED ORDER — MORPHINE SULFATE 4 MG/ML IJ SOLN
4.0000 mg | Freq: Once | INTRAMUSCULAR | Status: AC
  Administered 2011-12-21: 4 mg via INTRAVENOUS
  Filled 2011-12-21: qty 1

## 2011-12-21 NOTE — ED Provider Notes (Signed)
Medical screening examination/treatment/procedure(s) were conducted as a shared visit with non-physician practitioner(s) and myself.  I personally evaluated the patient during the encounter  I have reviewed EKG and previous admission, pt with recent cath that showed minimal disease She reports pain worse with position She was in no acute distress Plan was to check repeat troponin and if negative f/u with her cardiologist  Joya Gaskins, MD 12/21/11 1844

## 2011-12-21 NOTE — Discharge Instructions (Signed)
Chest Wall Pain Chest wall pain is pain in or around the bones and muscles of your chest. It may take up to 6 weeks to get better. It may take longer if you must stay physically active in your work and activities.  CAUSES  Chest wall pain may happen on its own. However, it may be caused by:  A viral illness like the flu.   Injury.   Coughing.   Exercise.   Arthritis.   Fibromyalgia.   Shingles.  HOME CARE INSTRUCTIONS   Avoid overtiring physical activity. Try not to strain or perform activities that cause pain. This includes any activities using your chest or your abdominal and side muscles, especially if heavy weights are used.   Put ice on the sore area.   Put ice in a plastic bag.   Place a towel between your skin and the bag.   Leave the ice on for 15 to 20 minutes per hour while awake for the first 2 days.   Only take over-the-counter or prescription medicines for pain, discomfort, or fever as directed by your caregiver.  SEEK IMMEDIATE MEDICAL CARE IF:   Your pain increases, or you are very uncomfortable.   You have a fever.   Your chest pain becomes worse.   You have new, unexplained symptoms.   You have nausea or vomiting.   You feel sweaty or lightheaded.   You have a cough with phlegm (sputum), or you cough up blood.  MAKE SURE YOU:   Understand these instructions.   Will watch your condition.   Will get help right away if you are not doing well or get worse.  Document Released: 07/21/2005 Document Revised: 07/10/2011 Document Reviewed: 03/17/2011 [employer] Patient Information 2012 [employer]. 

## 2012-01-07 ENCOUNTER — Telehealth: Payer: Self-pay | Admitting: *Deleted

## 2012-01-07 NOTE — Telephone Encounter (Signed)
Received call from Reuel Boom at [facility].  They received orders for custom and manual wheelchair.  Patient does not qualify due to Medicaid guidelines.  Based on assessment, patient can ambulate 50 feet and medicaid states that patient cannot ambulate more than 25 feet to qualify for wheelchair.  They have tried to contact patient, but all phone numbers in computer are disconnected.  Therapist who performed assessment has also been informed of denial.  Will route note to Dr. Tye Savoy.  Gaylene Brooks, RN

## 2012-01-20 ENCOUNTER — Emergency Department (HOSPITAL_COMMUNITY)
Admission: EM | Admit: 2012-01-20 | Discharge: 2012-01-21 | Disposition: A | Discharge status: Home / Self Care | Payer: Medicare Other | Attending: Emergency Medicine | Admitting: Emergency Medicine

## 2012-01-20 ENCOUNTER — Encounter (HOSPITAL_COMMUNITY): Payer: Self-pay | Admitting: Physical Medicine and Rehabilitation

## 2012-01-20 DIAGNOSIS — H538 Other visual disturbances: Secondary | ICD-10-CM | POA: Insufficient documentation

## 2012-01-20 DIAGNOSIS — I10 Essential (primary) hypertension: Secondary | ICD-10-CM | POA: Insufficient documentation

## 2012-01-20 DIAGNOSIS — Z8673 Personal history of transient ischemic attack (TIA), and cerebral infarction without residual deficits: Secondary | ICD-10-CM | POA: Insufficient documentation

## 2012-01-20 DIAGNOSIS — E119 Type 2 diabetes mellitus without complications: Secondary | ICD-10-CM | POA: Insufficient documentation

## 2012-01-20 DIAGNOSIS — R51 Headache: Secondary | ICD-10-CM | POA: Insufficient documentation

## 2012-01-20 MED ORDER — SODIUM CHLORIDE 0.9 % IV BOLUS (SEPSIS)
1000.0000 mL | Freq: Once | INTRAVENOUS | Status: AC
  Administered 2012-01-21: 1000 mL via INTRAVENOUS

## 2012-01-20 NOTE — ED Notes (Signed)
Pt presents to department for evaluation of headache. Onset this morning. 10/10 pain at the time. Also states blurred vision to L eye and photosensitivity. No nausea/vomiting. No neurological deficits noted. Pt is conscious alert and oriented x4. Ambulatory to triage.

## 2012-01-20 NOTE — ED Notes (Signed)
NURSE EXPLAINED DELAY / PROCESS . PT. STABLE / RESPIRATIONS UNLABORED.

## 2012-01-21 ENCOUNTER — Encounter (HOSPITAL_COMMUNITY): Payer: Self-pay | Admitting: Radiology

## 2012-01-21 ENCOUNTER — Emergency Department (HOSPITAL_COMMUNITY): Payer: Medicare Other

## 2012-01-21 MED ORDER — MORPHINE SULFATE 4 MG/ML IJ SOLN
4.0000 mg | Freq: Once | INTRAMUSCULAR | Status: AC
  Administered 2012-01-21: 4 mg via INTRAVENOUS
  Filled 2012-01-21: qty 1

## 2012-01-21 MED ORDER — DIPHENHYDRAMINE HCL 50 MG/ML IJ SOLN
25.0000 mg | Freq: Once | INTRAMUSCULAR | Status: AC
  Administered 2012-01-21: 02:00:00 via INTRAVENOUS
  Filled 2012-01-21: qty 1

## 2012-01-21 MED ORDER — METOCLOPRAMIDE HCL 5 MG/ML IJ SOLN
10.0000 mg | Freq: Once | INTRAMUSCULAR | Status: AC
  Administered 2012-01-21: 10 mg via INTRAVENOUS
  Filled 2012-01-21: qty 2

## 2012-01-21 MED ORDER — KETOROLAC TROMETHAMINE 30 MG/ML IJ SOLN
30.0000 mg | Freq: Once | INTRAMUSCULAR | Status: AC
  Administered 2012-01-21: 30 mg via INTRAVENOUS
  Filled 2012-01-21: qty 1

## 2012-01-21 NOTE — ED Provider Notes (Addendum)
Medical screening examination/treatment/procedure(s) were conducted as a shared visit with non-physician practitioner(s) and myself.  I personally evaluated the patient during the encounter  Gradual onset headache, progressively worsening. No photo/phonophobia. No neck stiffness/fever/chills. Worse in AM. CT head negative for intracranial lesion or spont ICH (on plavix, asa). Doubt SAH/meningitis. Headache 0/10 after reglan, benadryl, then morphine. CN II-XII intact. No EMC precluding discharge at this time. Given Precautions for return. PMD f/u.   Forbes Cellar, MD 01/21/12 4098  Forbes Cellar, MD 01/21/12 1191

## 2012-01-21 NOTE — ED Provider Notes (Signed)
History     CSN: [id number]  Arrival date & time 01/20/12  1734   First MD Initiated Contact with Patient 01/20/12 2331      Chief Complaint  Patient presents with  . Headache    (Consider location/radiation/quality/duration/timing/severity/associated sxs/prior treatment) HPI Patient presents emergency department with left-sided headache that is frontal that began this morning at 8 AM. the patient.  Headache was of gradual onset.  Patient, states that she has light-sensitive in left eye.  Patient, states that movement makes the headache, worse.  Patient denies dizziness, weakness, numbness, nausea, vomiting, chest pain, shortness breath, ear pain, ringing in her ear or fever. Past Medical History  Diagnosis Date  . Hypertension   . Hypertension     Hypertensive urgency 05/2006  . 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 Atrial fibrillation   . Diabetes mellitus   . Hypercholesteremia     Past Surgical History  Procedure Date  . Tonsilectomy, adenoidectomy, bilateral myringotomy and tubes age 38  . Dilation and curettage of uterus 1976  . Meniscus repair 03/09    right knee    Family History  Problem Relation Age of Onset  . Heart failure Mother   . Diabetes Father   . Hypertension Father   . Lung cancer Sister     History  Substance Use Topics  . Smoking status: Never Smoker   . Smokeless tobacco: Not on file  . Alcohol Use: No    OB History    Grav Para Term Preterm Abortions TAB SAB Ect Mult Living                  Review of Systems All other systems negative except as documented in the HPI. All pertinent positives and negatives as reviewed in the HPI.  Allergies  Propoxyphene-acetaminophen  Home Medications   Current Outpatient Rx  Name Route Sig Dispense Refill  . AMLODIPINE BESYLATE 10 MG PO TABS Oral Take 10 mg by mouth daily.    . ASPIRIN EC 81 MG PO TBEC Oral Take 81 mg by mouth daily.      . CELECOXIB 200 MG  PO CAPS Oral Take 200 mg by mouth daily.      Marland Kitchen CITALOPRAM HYDROBROMIDE 20 MG PO TABS Oral Take 20 mg by mouth daily.      Marland Kitchen CLONIDINE HCL 0.2 MG PO TABS Oral Take 0.2 mg by mouth 2 (two) times daily.    Marland Kitchen CLOPIDOGREL BISULFATE 75 MG PO TABS Oral Take 75 mg by mouth every morning.      . DULOXETINE HCL 30 MG PO CPEP Oral Take 30 mg by mouth daily.      Marland Kitchen GABAPENTIN 300 MG PO CAPS Oral Take 300 mg by mouth at bedtime.    Marland Kitchen GLIPIZIDE ER 5 MG PO TB24 Oral Take 5 mg by mouth daily. With breakfast    . LISINOPRIL-HYDROCHLOROTHIAZIDE 20-25 MG PO TABS Oral Take 1 tablet by mouth daily.    Marland Kitchen METFORMIN HCL 1000 MG PO TABS Oral Take 1,000 mg by mouth 2 (two) times daily.    Marland Kitchen METOPROLOL SUCCINATE ER 50 MG PO TB24 Oral Take 50 mg by mouth daily.    Marland Kitchen NITROGLYCERIN 0.4 MG SL SUBL Sublingual Place 0.4 mg under the tongue every 5 (five) minutes as needed. For chest pain     . OMEPRAZOLE 20 MG PO CPDR Oral Take 20 mg by mouth at bedtime.      Marland Kitchen  ROSUVASTATIN CALCIUM 10 MG PO TABS Oral Take 10 mg by mouth at bedtime.     . IBUPROFEN 200 MG PO TABS Oral Take 3 tablets (600 mg total) by mouth every 6 (six) hours as needed for pain. 30 tablet 0  . LISINOPRIL-HYDROCHLOROTHIAZIDE 20-25 MG PO TABS Oral Take 1 tablet by mouth 2 (two) times daily.        BP 135/68  Pulse 74  Temp 98.6 F (37 C) (Oral)  Resp 20  SpO2 100%  Physical Exam  Nursing note and vitals reviewed. Constitutional: She is oriented to person, place, and time. She appears well-developed and well-nourished.  HENT:  Head: Normocephalic and atraumatic.  Eyes: EOM are normal. Pupils are equal, round, and reactive to light.  Neck: Normal range of motion. Neck supple.  Cardiovascular: Normal rate and regular rhythm.   Pulmonary/Chest: Effort normal and breath sounds normal.  Neurological: She is alert and oriented to person, place, and time. She has normal strength. No cranial nerve deficit or sensory deficit. Coordination and gait normal. GCS  eye subscore is 4. GCS verbal subscore is 5. GCS motor subscore is 6.  Skin: Skin is warm and dry.    ED Course  Procedures (including critical care time)   Patient has no neurological deficits on exam, and no meningismus.  Patient be treated for migraine-type headache, based on her history of present illness and physical exam.  Patient is given IV fluids, and pain medications through the IV.  She is to followup with her primary care Dr. for recheck.  MDM          Carlyle Dolly, PA-C 01/21/12 [phone]

## 2012-01-21 NOTE — ED Notes (Signed)
MD at bedside. 

## 2012-01-21 NOTE — ED Notes (Signed)
Pt stated at certain points while doing the visual screening she was seeing double.

## 2012-01-21 NOTE — Discharge Instructions (Signed)
General Headache, Without Cause A general headache has no specific cause. These headaches are not life-threatening. They will not lead to other types of headaches. HOME CARE   Make and keep follow-up visits with your doctor.   Only take medicine as told by your doctor.   Try to relax, get a massage, or use your thoughts to control your body (biofeedback).   Apply cold or heat to the head and neck. Apply 3 or 4 times a day or as needed.  Finding out the results of your test Ask when your test results will be ready. Make sure you get your test results. GET HELP RIGHT AWAY IF:   You have problems with medicine.   Your medicine does not help relieve pain.   Your headache changes or becomes worse.   You feel sick to your stomach (nauseous) or throw up (vomit).   You have a temperature by mouth above 102 F (38.9 C), not controlled by medicine.   Your have a stiff neck.   You have vision loss.   You have muscle weakness.   You lose control of your muscles.   You lose balance or have trouble walking.   You feel like you are going to pass out (faint).  MAKE SURE YOU:   Understand these instructions.   Will watch this condition.   Will get help right away if you are not doing well or get worse.  Document Released: 04/29/2008 Document Revised: 07/10/2011 Document Reviewed: 04/29/2008 [facility] Patient Information 2012 [place], [place].  RESOURCE GUIDE  Dental Problems  Patients with Medicaid: [facility] [phone] [address]                                           [phone] [address] Phone:  [phone]                                                   Phone:  [phone]  If unable to pay or uninsured, contact:  [facility] or [facility] to become qualified for the adult dental clinic.  Chronic Pain Problems Contact [facility]  [phone] Patients need to be referred by their primary  care doctor.  Insufficient Money for Medicine Contact [employer] Way:  call "211" or [facility] [phone].  No Primary Care Doctor Call [facility]  [phone] Other agencies that provide inexpensive medical care    [facility]  [phone]    [facility]  [phone]    [facility]  [phone]    [facility]  [phone]    [facility]  [phone]    [facility]  [phone]  Psychological Services [facility] Behavioral [facility]  [phone] [facility]  [phone] [facility]   [phone] (emergency services [phone])  Abuse/Neglect [facility] Child Abuse Hotline [phone] [facility] Child Abuse Hotline (903)593-5792 (After Hours)  Emergency Shelter [facility] Minist[facility] [phone]  M[facility] [place] [facility] [phone] [facility] Services [phone]  MRSA Hotline #:  161-0960    [facility] Resources  [facility]  [employer]                           [facility] Dept. [address] [place]                     [address]         [address]  Blondell Reveal Phone:  [phone]                                  Phone:  [phone]                   Phone:  [phone]  [facility] Mental Health Phone:  [phone]  [facility] Child Abuse Hotline 671 740 [phone] (After Hours)

## 2012-02-02 ENCOUNTER — Emergency Department (HOSPITAL_COMMUNITY)
Admission: EM | Admit: 2012-02-02 | Discharge: 2012-02-03 | Disposition: A | Discharge status: Home / Self Care | Payer: Medicare Other | Attending: Emergency Medicine | Admitting: Emergency Medicine

## 2012-02-02 ENCOUNTER — Encounter (HOSPITAL_COMMUNITY): Payer: Self-pay | Admitting: Emergency Medicine

## 2012-02-02 DIAGNOSIS — R29898 Other symptoms and signs involving the musculoskeletal system: Secondary | ICD-10-CM | POA: Insufficient documentation

## 2012-02-02 DIAGNOSIS — Z8673 Personal history of transient ischemic attack (TIA), and cerebral infarction without residual deficits: Secondary | ICD-10-CM | POA: Insufficient documentation

## 2012-02-02 DIAGNOSIS — M545 Low back pain, unspecified: Secondary | ICD-10-CM | POA: Insufficient documentation

## 2012-02-02 DIAGNOSIS — E119 Type 2 diabetes mellitus without complications: Secondary | ICD-10-CM | POA: Insufficient documentation

## 2012-02-02 DIAGNOSIS — H538 Other visual disturbances: Secondary | ICD-10-CM | POA: Insufficient documentation

## 2012-02-02 DIAGNOSIS — I10 Essential (primary) hypertension: Secondary | ICD-10-CM | POA: Insufficient documentation

## 2012-02-02 DIAGNOSIS — I4891 Unspecified atrial fibrillation: Secondary | ICD-10-CM | POA: Insufficient documentation

## 2012-02-02 DIAGNOSIS — Z7982 Long term (current) use of aspirin: Secondary | ICD-10-CM | POA: Insufficient documentation

## 2012-02-02 DIAGNOSIS — Z79899 Other long term (current) drug therapy: Secondary | ICD-10-CM | POA: Insufficient documentation

## 2012-02-02 DIAGNOSIS — E7800 Pure hypercholesterolemia, unspecified: Secondary | ICD-10-CM | POA: Insufficient documentation

## 2012-02-02 DIAGNOSIS — R110 Nausea: Secondary | ICD-10-CM | POA: Insufficient documentation

## 2012-02-02 DIAGNOSIS — R51 Headache: Secondary | ICD-10-CM | POA: Insufficient documentation

## 2012-02-02 DIAGNOSIS — G8929 Other chronic pain: Secondary | ICD-10-CM | POA: Insufficient documentation

## 2012-02-02 MED ORDER — MORPHINE SULFATE 4 MG/ML IJ SOLN
8.0000 mg | Freq: Once | INTRAMUSCULAR | Status: AC
  Administered 2012-02-02: 8 mg via INTRAVENOUS
  Filled 2012-02-02: qty 2

## 2012-02-02 MED ORDER — ONDANSETRON HCL 4 MG/2ML IJ SOLN
4.0000 mg | Freq: Once | INTRAMUSCULAR | Status: AC
  Administered 2012-02-03: 4 mg via INTRAVENOUS
  Filled 2012-02-02: qty 2

## 2012-02-02 MED ORDER — DEXAMETHASONE SODIUM PHOSPHATE 10 MG/ML IJ SOLN
10.0000 mg | Freq: Once | INTRAMUSCULAR | Status: AC
  Administered 2012-02-02: 10 mg via INTRAVENOUS
  Filled 2012-02-02: qty 1

## 2012-02-02 MED ORDER — LORAZEPAM 2 MG/ML IJ SOLN
1.0000 mg | Freq: Once | INTRAMUSCULAR | Status: AC
  Administered 2012-02-03: 1 mg via INTRAVENOUS
  Filled 2012-02-02: qty 1

## 2012-02-02 MED ORDER — DIPHENHYDRAMINE HCL 50 MG/ML IJ SOLN
50.0000 mg | Freq: Once | INTRAMUSCULAR | Status: AC
  Administered 2012-02-02: 50 mg via INTRAVENOUS
  Filled 2012-02-02: qty 1

## 2012-02-02 MED ORDER — METOCLOPRAMIDE HCL 5 MG/ML IJ SOLN
10.0000 mg | Freq: Once | INTRAMUSCULAR | Status: AC
  Administered 2012-02-02: 10 mg via INTRAVENOUS
  Filled 2012-02-02: qty 2

## 2012-02-02 NOTE — ED Notes (Addendum)
Pt reports having headache for 2 days in back of head mainly on L side of head; reports double vision in L eye, little sensitivity to light; got nauseous yesterday one time, but thinks it was d/t what she ate; pt with hx of cva in past; no drift, grips equal face symmetrical, speech clear; pt reports previous CVA has left her with unsteady balance and uses cane

## 2012-02-02 NOTE — ED Provider Notes (Signed)
History     CSN: [id number]  Arrival date & time 02/02/12  1754   First MD Initiated Contact with Patient 02/02/12 2218      Chief Complaint  Patient presents with  . Migraine    (Consider location/radiation/quality/duration/timing/severity/associated sxs/prior treatment) HPI This 56 year old female presents with a gradual onset headache like she has had about 4 times now in the last couple of months. These headaches are always been a left-sided gradual onset. They're not sudden onset and not global. She has had gradual onset of the last 2 days of a headache in her headaches usually last several hours to couple of days. She has had relief in the emergency department in the past for these headaches. She had recent CT scan in the emergency room for the same type of headache. She states over the last 2 days or so she has had gradual onset of a left-sided throbbing headache with some nausea without vomiting. She has slightly blurred vision in her left eye only without true diplopia. She has had vertigo in the past for stroke does not have any vertigo with this headache. She is able to walk with her cane at baseline and it is unchanged with this headache. She has chronic low back pain and weakness in both legs which is unchanged and baseline for her. She does not have any lateralizing weakness or numbness or incoordination in her extremities. She is no change in speech swallowing or understanding. She is no blindness or blind spots. This headache is just like her prior few headaches that she has had like this. There is no treatment prior to arrival. Her headache is moderately severe. She has not had any eye pain or redness to her left eye. Past Medical History  Diagnosis Date  . Hypertension   . Hypertension     Hypertensive urgency 05/2006  . Atrial fibrillation   . Hypercholesteremia   . 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 Diabetes mellitus     type 2     Past Surgical History  Procedure Date  . Tonsilectomy, adenoidectomy, bilateral myringotomy and tubes age 37  . Dilation and curettage of uterus 1976  . Meniscus repair 03/09    right knee  . Tonsillectomy     Family History  Problem Relation Age of Onset  . Heart failure Mother   . Diabetes Father   . Hypertension Father   . Lung cancer Sister     History  Substance Use Topics  . Smoking status: Never Smoker   . Smokeless tobacco: Not on file  . Alcohol Use: No    OB History    Grav Para Term Preterm Abortions TAB SAB Ect Mult Living                  Review of Systems  Constitutional: Negative for fever.       10 Systems reviewed and are negative for acute change except as noted in the HPI.  HENT: Negative for congestion.   Eyes: Negative for discharge and redness.  Respiratory: Negative for cough and shortness of breath.   Cardiovascular: Negative for chest pain.  Gastrointestinal: Positive for nausea. Negative for vomiting and abdominal pain.  Musculoskeletal: Negative for back pain.  Skin: Negative for rash.  Neurological: Positive for headaches. Negative for dizziness, syncope, facial asymmetry, speech difficulty, weakness, light-headedness and numbness.  Psychiatric/Behavioral:       No behavior change.  Allergies  Propoxyphene-acetaminophen  Home Medications   Current Outpatient Rx  Name Route Sig Dispense Refill  . AMLODIPINE BESYLATE 10 MG PO TABS Oral Take 10 mg by mouth daily.    . ASPIRIN EC 81 MG PO TBEC Oral Take 81 mg by mouth daily.      . CELECOXIB 200 MG PO CAPS Oral Take 200 mg by mouth daily.      Marland Kitchen CITALOPRAM HYDROBROMIDE 20 MG PO TABS Oral Take 20 mg by mouth daily.      Marland Kitchen CLONIDINE HCL 0.2 MG PO TABS Oral Take 0.2 mg by mouth 2 (two) times daily.    Marland Kitchen CLOPIDOGREL BISULFATE 75 MG PO TABS Oral Take 75 mg by mouth every morning.      . DULOXETINE HCL 30 MG PO CPEP Oral Take 30 mg by mouth daily.      Marland Kitchen GABAPENTIN 300 MG PO CAPS  Oral Take 300 mg by mouth at bedtime.    Marland Kitchen GLIPIZIDE ER 5 MG PO TB24 Oral Take 5 mg by mouth daily. With breakfast    . LISINOPRIL-HYDROCHLOROTHIAZIDE 20-25 MG PO TABS Oral Take 1 tablet by mouth daily.    Marland Kitchen METFORMIN HCL 1000 MG PO TABS Oral Take 1,000 mg by mouth 2 (two) times daily.    Marland Kitchen METOPROLOL SUCCINATE ER 50 MG PO TB24 Oral Take 50 mg by mouth daily.    Marland Kitchen NITROGLYCERIN 0.4 MG SL SUBL Sublingual Place 0.4 mg under the tongue every 5 (five) minutes as needed. For chest pain     . OMEPRAZOLE 20 MG PO CPDR Oral Take 20 mg by mouth at bedtime.      Marland Kitchen ROSUVASTATIN CALCIUM 10 MG PO TABS Oral Take 10 mg by mouth at bedtime.     Marland Kitchen ONDANSETRON 8 MG PO TBDP  8mg  ODT q4 hours prn nausea 3 tablet 0  . OXYCODONE-ACETAMINOPHEN 5-325 MG PO TABS Oral Take 2 tablets by mouth every 4 (four) hours as needed for pain. 6 tablet 0    BP 154/93  Pulse 74  Temp 97.8 F (36.6 C) (Oral)  Resp 20  SpO2 99%  Physical Exam  Nursing note and vitals reviewed. Constitutional:       Awake, alert, nontoxic appearance with baseline speech for patient.  HENT:  Head: Atraumatic.  Mouth/Throat: No oropharyngeal exudate.  Eyes: EOM are normal. Pupils are equal, round, and reactive to light. Right eye exhibits no discharge. Left eye exhibits no discharge.  Neck: Neck supple.  Cardiovascular: Normal rate and regular rhythm.   No murmur heard. Pulmonary/Chest: Effort normal and breath sounds normal. No stridor. No respiratory distress. She has no wheezes. She has no rales. She exhibits no tenderness.  Abdominal: Soft. Bowel sounds are normal. She exhibits no mass. There is no tenderness. There is no rebound.  Musculoskeletal: She exhibits no tenderness.       Baseline ROM, moves extremities with no obvious new focal weakness.  Lymphadenopathy:    She has no cervical adenopathy.  Neurological: She is alert.       Awake, alert, cooperative and aware of situation; motor strength bilaterally; sensation normal to  light touch bilaterally; peripheral visual fields full to confrontation; no facial asymmetry; tongue midline; major cranial nerves appear intact; no pronator drift, normal finger to nose bilaterally, and according to the Pt today has baseline gait without new ataxia.  Skin: No rash noted.  Psychiatric: She has a normal mood and affect.    ED Course  Procedures (  including critical care time) Pt feels improved after observation and/or treatment in ED.Patient informed of clinical course, understand medical decision-making process, and agree with plan. Labs Reviewed - No data to display No results found.   1. Headache       MDM   Pt stable in ED with no significant deterioration in condition.Patient / Family / Caregiver informed of clinical course, understand medical decision-making process, and agree with plan.        Hurman Horn, MD 02/07/12 [phone]

## 2012-02-03 MED ORDER — OXYCODONE-ACETAMINOPHEN 5-325 MG PO TABS: 2.0000 | ORAL_TABLET | ORAL | Status: AC | PRN

## 2012-02-03 MED ORDER — ONDANSETRON 8 MG PO TBDP: ORAL_TABLET | ORAL | Status: AC

## 2012-02-03 NOTE — Discharge Instructions (Signed)

## 2012-03-30 ENCOUNTER — Other Ambulatory Visit: Payer: Self-pay | Admitting: Cardiology

## 2012-03-31 ENCOUNTER — Telehealth: Payer: Self-pay | Admitting: *Deleted

## 2012-03-31 NOTE — Telephone Encounter (Signed)
PA required for celebrex. Form placed in MD box.

## 2012-03-31 NOTE — Telephone Encounter (Signed)
Faxed to insurance company.

## 2012-03-31 NOTE — Telephone Encounter (Signed)
Form filled and given to Lynn.  

## 2012-04-01 NOTE — Telephone Encounter (Signed)
Approval received and pharmacy notified. 

## 2012-04-21 ENCOUNTER — Ambulatory Visit (INDEPENDENT_AMBULATORY_CARE_PROVIDER_SITE_OTHER): Payer: Medicare Other | Admitting: Family Medicine

## 2012-04-21 ENCOUNTER — Encounter: Payer: Self-pay | Admitting: Family Medicine

## 2012-04-21 VITALS — Temp 97.9°F | Ht 62.0 in | Wt 269.0 lb

## 2012-04-21 DIAGNOSIS — R51 Headache: Secondary | ICD-10-CM

## 2012-04-21 DIAGNOSIS — I10 Essential (primary) hypertension: Secondary | ICD-10-CM

## 2012-04-21 MED ORDER — METOPROLOL-HYDROCHLOROTHIAZIDE 100-25 MG PO TABS: 1.0000 | ORAL_TABLET | Freq: Every day | ORAL | Status: DC

## 2012-04-21 MED ORDER — NYSTATIN 100000 UNIT/GM EX POWD: Freq: Four times a day (QID) | CUTANEOUS | Status: DC

## 2012-04-21 MED ORDER — LISINOPRIL 40 MG PO TABS: 40.0000 mg | ORAL_TABLET | Freq: Every day | ORAL | Status: DC

## 2012-04-21 MED ORDER — KETOROLAC TROMETHAMINE 60 MG/2ML IM SOLN
60.0000 mg | Freq: Once | INTRAMUSCULAR | Status: AC
  Administered 2012-04-21: 60 mg via INTRAMUSCULAR

## 2012-04-21 MED ORDER — PROMETHAZINE HCL 25 MG/ML IJ SOLN
12.5000 mg | Freq: Once | INTRAMUSCULAR | Status: AC
  Administered 2012-04-21: 12.5 mg via INTRAMUSCULAR

## 2012-04-21 NOTE — Patient Instructions (Addendum)
Your blood pressure was elevated today 189/100. I sent new BP medications to [facility]:  Metoprolol 100 - HCTZ 25 mg one tablet daily, Lisinopril 40 mg one tablet daily. Stop Lisinopril - HCTZ pill and Metoprolol 25 mg once you get your new medications. For headaches, take Celebrex 200 mg 2-3 tablets every 6 hours as needed for pain. You heating pads to the back of your neck. If headaches persist, please call our clinic for a same day appointment. Schedule follow up appointment with me in 2 weeks to recheck your BP.  Tension Headache (Muscle Contraction Headache) Tension headache is one of the most common causes of head pain. These headaches are usually felt as a pain over the top of your head and back of your neck. Stress, anxiety, and depression are common triggers for these headaches. Tension headaches are not life-threatening and will not lead to other types of headaches. Tension headaches can often be diagnosed by taking a history from the patient and a physical exam. Sometimes, further lab and x-ray studies are used to confirm the diagnosis. Your caregiver can advise you on how to get help solving problems that cause anxiety or stress. Antidepressants can be prescribed if depression is a problem. HOME CARE INSTRUCTIONS   If testing was done, call for your results. Remember, it is your responsibility to get the results of all testing. Do not assume everything is fine because you do not hear from your caregiver.   Only take over-the-counter or prescription medicines for pain, discomfort, or fever as directed by your caregiver.   Biofeedback, massage, or other relaxation techniques may be helpful.   Ice packs or heat to the head and neck can be used. Use these three to four times per day or as needed.   Physical therapy may be a useful addition to treatment.   If headaches continue, even with therapy, you may need to think about lifestyle changes.   Avoid excessive use of pain killers, as  rebound headaches can occur.  SEEK MEDICAL CARE IF:   You develop problems with medications prescribed.   You do not respond or get no relief from medications.   You have a change from the usual headache.   You develop nausea (feeling sick to your stomach) or vomiting.  SEEK IMMEDIATE MEDICAL CARE IF:   Your headache becomes severe.   You have an unexplained oral temperature above 102 F (38.9 C).   You develop a stiff neck.   You have loss of vision.   You have muscular weakness.   You have loss of muscular control.   You develop severe symptoms different from your first symptoms.   You start losing your balance or have trouble walking.   You feel faint or pass out.  MAKE SURE YOU:   Understand these instructions.   Will watch your condition.   Will get help right away if you are not doing well or get worse.  Document Released: 07/21/2005 Document Revised: 07/10/2011 Document Reviewed: 03/09/2008 [facility] Patient Information 2012 [place], [place].

## 2012-04-21 NOTE — Assessment & Plan Note (Signed)
BP elevated 189/100 despite taking her medications this AM.  Could be worsened by stress. Will increase Lisinopril to 40 mg and increase Metoprolol to 100 mg daily. Follow up in one week. Red flags reviewed per AVS.

## 2012-04-21 NOTE — Progress Notes (Signed)
  Subjective:    Patient ID: [name], female    DOB: 05/10/1956, 56 y.o.   MRN: [id number]  HPI  Patient presents to same day appointment for headache and "not feeling well."  Hypertension: BP elevated today 189/100.  She took her AM blood pressure medications today.  Patient does not exercise, because she has fallen once while going for a walk and refuses to do that again.  She does not adhere to a low sodium diet.  Patient complains of recurrent HA, but denies any changes vision today.  She denies any CP, SOB, or dyspnea at this time.  Her stress level is high due to social issues.  This could be contributing to elevated BP.    Headache: started about 4 months ago.  For the few months, she has been getting HA about 2-3 times per month.  Patient says she goes to ED for treatment and then discharged home, but HA recurs.  Located posterior head and radiates to temporal lobes.  At times, are associated with aura and blurry vision.  Denies any nausea or vomiting.  Denies any associated neck pain or stiffness.  Patient takes Celebrex for her chronic low back pain, but this does not help her headaches.  Patient has been very stressed out lately - when I ask her what has been stressful, she says "You name it, I got it."  Fortunately, Pam, our Care coordinator was present in clinic and will assist patient with these needs (food stamps, counseling, home health aid, etc).  Denies any blurry vision, paresthesias, or unilateral weakness at this time.  Review of Systems  Per HPI    Objective:   Physical Exam  Constitutional: No distress.  HENT:  Head: Normocephalic and atraumatic.  Neck: Normal range of motion. Neck supple.  Pulmonary/Chest: Effort normal.  Neurological: She is alert. She has normal strength. No cranial nerve deficit or sensory deficit.  Psychiatric: Her speech is normal and behavior is normal. She exhibits a depressed mood.         Assessment & Plan:

## 2012-04-21 NOTE — Addendum Note (Signed)
Addended by: Farrell Ours on: 04/21/2012 05:14 PM   Modules accepted: Orders

## 2012-04-21 NOTE — Assessment & Plan Note (Signed)
May be secondary to recent stressors/tension headache vs. High BP. Will increase BP medications.  See Problem List. Celebrex 200 mg 2-3 tablets every 6 hours as needed for pain at home. Gave headache cocktail in clinic today. Heating pads recommended.  If no improvement, patient to return to clinic.

## 2012-04-23 ENCOUNTER — Telehealth: Payer: Self-pay | Admitting: *Deleted

## 2012-04-23 NOTE — Telephone Encounter (Signed)
Patient declined services:  Yes  No   Primary Presenting Issue: c/o headaches, depression Interventions:  appointment made with Dr. Barnabas Lister  for 04/21/2012 Has issue been resolved:  Yes  NO patient has appointment with PCP on 04/21/2012 to address issues.   Secondary Presenting Issue: patient states she does not have food in the house, has many problems leading to her depression Interventions:  Danford Bad, LCSW sent todo to discuss issues contributing to her depression, make referral as needed.  Mardene Celeste also asked to follow up with obtaining a food voucher for a local food bank. Has issue been resolved:  Yes  No  pending   Additional Comments:  patient also requested information on wheelchair which was recommended by [facility].  This RNCM plans to attend office appointment with patient on 04/21/2012     Emilia Beck, RN-BC, M.Ed. [facility] Coordinator [phone] [email or web address]

## 2012-05-03 ENCOUNTER — Ambulatory Visit (INDEPENDENT_AMBULATORY_CARE_PROVIDER_SITE_OTHER): Payer: Medicare Other | Admitting: Family Medicine

## 2012-05-03 ENCOUNTER — Encounter: Payer: Self-pay | Admitting: Family Medicine

## 2012-05-03 VITALS — BP 166/99 | HR 85 | Temp 98.4°F | Ht 62.0 in | Wt 263.5 lb

## 2012-05-03 DIAGNOSIS — Z23 Encounter for immunization: Secondary | ICD-10-CM

## 2012-05-03 DIAGNOSIS — E119 Type 2 diabetes mellitus without complications: Secondary | ICD-10-CM

## 2012-05-03 DIAGNOSIS — I10 Essential (primary) hypertension: Secondary | ICD-10-CM

## 2012-05-03 MED ORDER — CITALOPRAM HYDROBROMIDE 20 MG PO TABS: 20.0000 mg | ORAL_TABLET | Freq: Every day | ORAL | Status: DC

## 2012-05-03 NOTE — Progress Notes (Signed)
  Subjective:    Patient here for follow-up of elevated blood pressure and DM.  She is not exercising (uses a walker) and is not adherent to a low-salt diet (she cannot afford healthy food and usually eats canned/processed foods).  Blood pressure is not well controlled at home.  She complains of headache only.  Patient denies: chest pain, dyspnea, lower extremity edema, near-syncope and syncope.  Denies any numbness/tingling of extremities.  Cardiovascular risk factors: diabetes mellitus and sedentary lifestyle. Use of agents associated with hypertension: NSAIDS - she takes Celebrex as needed for chronic back pain.  Patient was seen about 2 weeks ago and medications for HTN were increased.  However, she is waiting for prescriptions to be mailed via [facility], so she has not started new regimen yet.  DM:  Patient is due for A1c today.  She currently takes Metformin 1000 BID.  She stopped taking Glipizide due to nausea.  She tries to avoid foods high in sugar and has been losing weight intentionally.  Patient does not have an eye doctor because she says she cannot afford it right now.  She denies any blurry vision, sensory loss in extremities.  She says her CBG range from 140-160 at home.    Review of Systems Per HPI   Objective:    BP 166/99  Pulse 85  Temp 98.4 F (36.9 C) (Oral)  Ht 5\' 2"  (1.575 m)  Wt 263 lb 8 oz (119.523 kg)  BMI 48.19 kg/m2 General appearance: alert, cooperative and no distress Lungs: clear to auscultation bilaterally Heart: regular rate and rhythm, S1, S2 normal, no murmur, click, rub or gallop Neurologic: Cranial nerves: normal Sensory: normal Motor: grossly normal    Assessment:    Hypertension.  Type 2 DM.    Plan:   See Problem List

## 2012-05-03 NOTE — Assessment & Plan Note (Signed)
BP decreased on old regimen.  She is still waiting for [facility] to send Rx this Friday. Changed to Lisinopril to 40 mg and increase Metoprolol to 100 mg daily at last visit. Will recheck BP in 2 weeks after she starts new regimen. Counseled patient on DASH diet. Red flags discussed.

## 2012-05-03 NOTE — Patient Instructions (Addendum)
High Blood Pressure: Your blood pressure was a bit elevated today, but better than last time. I sent new BP medications to [facility]: Metoprolol 100 - HCTZ 25 mg one tablet daily, Lisinopril 40 mg one tablet daily.  Stop Lisinopril - HCTZ pill and Metoprolol 25 mg once you get your new medications.   Headaches: For headaches, take Tylenol (Generic: acetaminophen) 1-2 tablets every 6 hours as needed for pain. Apply a warm wash cloth or ice to the back of your neck or forehead.  Schedule follow up appointment with me in 2 weeks to recheck your BP.  Diabetes: Your A1c was 7.6 which is down from 8.0.  Good job! Continue to take Metformin twice a day and avoid foods high in sugars. We will recheck this in 3 months. Please call MD if blood sugar is less than 60 or higher than 250!    DASH Diet The DASH diet stands for "Dietary Approaches to Stop Hypertension." It is a healthy eating plan that has been shown to reduce high blood pressure (hypertension) in as little as 14 days, while also possibly providing other significant health benefits. These other health benefits include reducing the risk of breast cancer after menopause and reducing the risk of type 2 diabetes, heart disease, colon cancer, and stroke. Health benefits also include weight loss and slowing kidney failure in patients with chronic kidney disease.  DIET GUIDELINES  Limit salt (sodium). Your diet should contain less than 1500 mg of sodium daily.   Limit refined or processed carbohydrates. Your diet should include mostly whole grains. Desserts and added sugars should be used sparingly.   Include small amounts of heart-healthy fats. These types of fats include nuts, oils, and tub margarine. Limit saturated and trans fats. These fats have been shown to be harmful in the body.  CHOOSING FOODS  The following food groups are based on a 2000 calorie diet. See your Registered Dietitian for individual calorie needs. Grains and Grain  Products (6 to 8 servings daily)  Eat More Often: Whole-wheat bread, brown rice, whole-grain or wheat pasta, quinoa, popcorn without added fat or salt (air popped).   Eat Less Often: White bread, white pasta, white rice, cornbread.  Vegetables (4 to 5 servings daily)  Eat More Often: Fresh, frozen, and canned vegetables. Vegetables may be raw, steamed, roasted, or grilled with a minimal amount of fat.   Eat Less Often/Avoid: Creamed or fried vegetables. Vegetables in a cheese sauce.  Fruit (4 to 5 servings daily)  Eat More Often: All fresh, canned (in natural juice), or frozen fruits. Dried fruits without added sugar. One hundred percent fruit juice ( cup [237 mL] daily).   Eat Less Often: Dried fruits with added sugar. Canned fruit in light or heavy syrup.  [employer], Fish, and Poultry (2 servings or less daily. One serving is 3 to 4 oz [85-114 g]).  Eat More Often: Ninety percent or leaner ground beef, tenderloin, sirloin. Round cuts of beef, chicken breast, [place] breast. All fish. Grill, bake, or broil your meat. Nothing should be fried.   Eat Less Often/Avoid: Fatty cuts of meat, [place], or chicken leg, thigh, or wing. Fried cuts of meat or fish.  Dairy (2 to 3 servings)  Eat More Often: Low-fat or fat-free milk, low-fat plain or light yogurt, reduced-fat or part-skim cheese.   Eat Less Often/Avoid: Milk (whole, 2%, skim, or chocolate).Whole milk yogurt. Full-fat cheeses.  Nuts, Seeds, and Legumes (4 to 5 servings per week)  Eat More  Often: All without added salt.   Eat Less Often/Avoid: Salted nuts and seeds, canned beans with added salt.  Fats and Sweets (limited)  Eat More Often: Vegetable oils, tub margarines without trans fats, sugar-free gelatin. Mayonnaise and salad dressings.   Eat Less Often/Avoid: Coconut oils, palm oils, butter, stick margarine, cream, half and half, cookies, candy, pie.  FOR MORE INFORMATION The Dash Diet Eating Plan: www.dashdiet.org Document  Released: 07/10/2011 Document Reviewed: 06/30/2011 [facility] Patient Information 2012 [place], [place].

## 2012-05-03 NOTE — Assessment & Plan Note (Signed)
A1c decreased from 8.0 to 7.6. Encouraged patient to continue to avoid sugars and try to lose more weight. Recheck A1c in 3 months - will perform foot exam at that time.

## 2012-05-04 DIAGNOSIS — Z23 Encounter for immunization: Secondary | ICD-10-CM

## 2012-05-06 ENCOUNTER — Other Ambulatory Visit: Payer: Self-pay | Admitting: Family Medicine

## 2012-05-07 ENCOUNTER — Telehealth: Payer: Self-pay | Admitting: Family Medicine

## 2012-05-07 NOTE — Telephone Encounter (Signed)
[facility]oke with [facility] and they received the rx's yesterday and also the prior authorization for the celexa also. These medications below are in patient's shipment of medication she will be receiving today. I called her back and informed her of this.

## 2012-05-07 NOTE — Telephone Encounter (Signed)
Patient is calling to speak to the nurse about her Blood Pressure medication.  This was discussed last week, but the Rx still hasn't been called or faxed into her pharmacy.  There were also 2 other medications that were supposed to be sent and they haven't been sent either.

## 2012-05-07 NOTE — Telephone Encounter (Signed)
Spoke with patient and she stated that the medications have not been received by [facility] yet. Dr Tye Savoy put her on Metoprolol 25mg , linsinopril 40, celexa, and nystatting has not been received. I informed patient that I will call [facility] to check on this also.

## 2012-05-18 ENCOUNTER — Ambulatory Visit: Payer: Medicare Other | Admitting: Family Medicine

## 2012-06-08 ENCOUNTER — Other Ambulatory Visit: Payer: Self-pay | Admitting: Family Medicine

## 2012-06-08 ENCOUNTER — Ambulatory Visit: Payer: Medicare Other | Admitting: Family Medicine

## 2012-07-07 ENCOUNTER — Encounter (HOSPITAL_COMMUNITY): Payer: Self-pay | Admitting: Emergency Medicine

## 2012-07-07 ENCOUNTER — Emergency Department (HOSPITAL_COMMUNITY): Payer: Medicare HMO

## 2012-07-07 ENCOUNTER — Observation Stay (HOSPITAL_COMMUNITY)
Admission: EM | Admit: 2012-07-07 | Discharge: 2012-07-09 | Disposition: A | Discharge status: Home / Self Care | Payer: Medicare HMO | Attending: Family Medicine | Admitting: Family Medicine

## 2012-07-07 ENCOUNTER — Other Ambulatory Visit: Payer: Self-pay

## 2012-07-07 DIAGNOSIS — R519 Headache, unspecified: Secondary | ICD-10-CM | POA: Diagnosis present

## 2012-07-07 DIAGNOSIS — K5900 Constipation, unspecified: Secondary | ICD-10-CM | POA: Diagnosis present

## 2012-07-07 DIAGNOSIS — I10 Essential (primary) hypertension: Secondary | ICD-10-CM | POA: Diagnosis present

## 2012-07-07 DIAGNOSIS — R51 Headache: Principal | ICD-10-CM | POA: Insufficient documentation

## 2012-07-07 DIAGNOSIS — Z79899 Other long term (current) drug therapy: Secondary | ICD-10-CM | POA: Insufficient documentation

## 2012-07-07 DIAGNOSIS — H532 Diplopia: Secondary | ICD-10-CM

## 2012-07-07 DIAGNOSIS — E1169 Type 2 diabetes mellitus with other specified complication: Secondary | ICD-10-CM | POA: Diagnosis present

## 2012-07-07 DIAGNOSIS — E1165 Type 2 diabetes mellitus with hyperglycemia: Secondary | ICD-10-CM | POA: Diagnosis present

## 2012-07-07 DIAGNOSIS — E785 Hyperlipidemia, unspecified: Secondary | ICD-10-CM | POA: Insufficient documentation

## 2012-07-07 DIAGNOSIS — IMO0002 Reserved for concepts with insufficient information to code with codable children: Secondary | ICD-10-CM | POA: Diagnosis present

## 2012-07-07 DIAGNOSIS — E119 Type 2 diabetes mellitus without complications: Secondary | ICD-10-CM | POA: Insufficient documentation

## 2012-07-07 DIAGNOSIS — I4891 Unspecified atrial fibrillation: Secondary | ICD-10-CM | POA: Diagnosis present

## 2012-07-07 DIAGNOSIS — Z8673 Personal history of transient ischemic attack (TIA), and cerebral infarction without residual deficits: Secondary | ICD-10-CM

## 2012-07-07 LAB — BASIC METABOLIC PANEL
Calcium: 9.7 mg/dL (ref 8.4–10.5)
Chloride: 101 mEq/L (ref 96–112)
Creatinine, Ser: 0.85 mg/dL (ref 0.50–1.10)
GFR calc non Af Amer: 75 mL/min — ABNORMAL LOW (ref >90)
Sodium: 140 mEq/L (ref 135–145)

## 2012-07-07 LAB — CBC WITH DIFFERENTIAL/PLATELET
Eosinophils Absolute: 0.1 10*3/uL (ref 0.0–0.7)
Eosinophils Relative: 1 % (ref 0–5)
HCT: 38.2 % (ref 36.0–46.0)
Lymphocytes Relative: 43 % (ref 12–46)
Lymphs Abs: 3.3 10*3/uL (ref 0.7–4.0)
MCH: 22.8 pg — ABNORMAL LOW (ref 26.0–34.0)
MCHC: 32.7 g/dL (ref 30.0–36.0)
MCV: 69.7 fL — ABNORMAL LOW (ref 78.0–100.0)
Monocytes Absolute: 0.5 10*3/uL (ref 0.1–1.0)
Neutrophils Relative %: 49 % (ref 43–77)
Platelets: 274 10*3/uL (ref 150–400)
RDW: 15.8 % — ABNORMAL HIGH (ref 11.5–15.5)
WBC: 7.6 10*3/uL (ref 4.0–10.5)

## 2012-07-07 LAB — POCT I-STAT TROPONIN I

## 2012-07-07 LAB — PROTIME-INR
INR: 1.04 (ref 0.00–1.49)
Prothrombin Time: 13.5 seconds (ref 11.6–15.2)

## 2012-07-07 MED ORDER — SODIUM CHLORIDE 0.9 % IV BOLUS (SEPSIS)
500.0000 mL | Freq: Once | INTRAVENOUS | Status: AC
  Administered 2012-07-07: 500 mL via INTRAVENOUS

## 2012-07-07 MED ORDER — PROMETHAZINE HCL 25 MG/ML IJ SOLN
25.0000 mg | Freq: Once | INTRAMUSCULAR | Status: AC
  Administered 2012-07-07: 25 mg via INTRAVENOUS
  Filled 2012-07-07: qty 1

## 2012-07-07 NOTE — H&P (Signed)
[facility] Admission History and Physical Service Pager: [phone]  Patient name: [name] Medical record number: [id number] Date of birth: 07/14/1956 Age: 56 y.o. Gender: female  Primary Care Provider: DE LA CRUZ,IVY, DO  Chief Complaint: Diplopia with HA   Assessment and Plan: [name] is a 56 y.o. year old female with PMHx significant for previous CVA, paroxysmal A fib, HTN, and Migraines presenting with diplopia concerning for new CVA.   1. Diplopia concerning for TIA vs CVA vs Migraine 1. Pt with previous CVA on Plavix and ASA.  New onset Diplopia but no focal neurologic weakness present per patient and per exam.  Low likelihood at this point that this may be a new CVA. 2. CT scan in ED did not show acute changes and EKG was unchanged from previous .  Will get fasting Lipid and A1C and BMP in AM along with EKG.  Will consider 2 D Echo, Carotid Dopplers, and MRI w/ w/out contrast 3. Telemetry overnight, PT/OT evaluation in the AM.  Pt able to tolerate liquids in ED.  Will give heart healthy diet with precautions. 4. Continue home Plavix and ASA.  Neurology was consulted in ED and consider further evaluation/management. 5. If Migraine causing this, will try one time dose of Reglan + Naproxen  2. DM II, well controlled - Pt BMP shows glucose of 127 and her last A1C in September was 7.6 1. Will hold Metformin while in patient  3. Hyperlipidemia - Will check Lipid Profile in AM 1. Continue home Crestor  4. HTN - Stable in ED. 1. Continue home medications  5. Hx of Paroxsymal A fib - EKG showed NSR in ED 1. Will keep on Telemetry overnight and repeat EKG in AM 6. Constpation - Last BM reported to be last week 1. Miralax BID 7. FEN/GI: Heart Healthy Diet, Protonix 8. Prophylaxis: Heparin SQ 9. Disposition: Pending Clinical improvement and work up 10. Code Status: Full  History of Present Illness: [name] is a 56 y.o. year old female  presenting with worsening diplopia for the last two days.  Pt has a PMHx significant for CVA in 2011 resulting in blurred vision that has persisted since then.  She states that her blurred vision has not worsened but she did develop the double vision over the last two days.  At the same time, she developed a HA located on the left frontal region of her head along with descriptions of increased eye pressure.  She does have a PMHx significant for Migraines in which she usually has scotomas and laying a dark area will relieve them.  She did have scotomas with this episode but laying in the dark did not relieve this episode.  She did not try any medications to relieve this HA and is not on a prophylactic medication to prevent these.  She denies any associated dizziness, chest pain, worsened SOB, productive cough, rhinorrhea, wheezing, abdominal pain, vomiting, fatigue, edema, dysuria, palpitations.   In the ED, they performed multiple tests including a BMP (WNL except glucose to 127), CBC (WNL), CT head that did not show acute changes, EKG that was unchanged from previous.  They did give her some Phenergan which helped control some of the nausea she was experiencing and gave her a 500 cc bolus NS.   Pt denies current/previous drug use, alcohol use, or tobacco use.  She lives at home and does not report any sick contacts.  Patient Active Problem List  Diagnosis  .  Dermatophytosis of foot  . DIABETES MELLITUS, TYPE II  . HYPERLIPIDEMIA  . DEPRESSION  . CARPAL TUNNEL SYNDROME, LEFT  . HYPERTENSION  . ATRIAL FIBRILLATION  . CHRONIC KIDNEY DISEASE STAGE II (MILD)  . STRESS INCONTINENCE  . Encounter for long-term (current) use of anticoagulants  . Headache  . Hot flashes  . History of CVA (cerebrovascular accident)  . Chest pain  . Constipation  . Edema  . Abnormality of gait and mobility   Past Medical History: Past Medical History  Diagnosis Date  . Hypertension   . Hypertension      Hypertensive urgency 05/2006  . Atrial fibrillation   . Hypercholesteremia   . 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 Diabetes mellitus     type 2   Past Surgical History: Past Surgical History  Procedure Date  . Tonsilectomy, adenoidectomy, bilateral myringotomy and tubes age 25  . Dilation and curettage of uterus 1976  . Meniscus repair 03/09    right knee  . Tonsillectomy    Social History: History  Substance Use Topics  . Smoking status: Never Smoker   . Smokeless tobacco: Not on file  . Alcohol Use: No   For any additional social history documentation, please refer to relevant sections of EMR.  Family History: Family History  Problem Relation Age of Onset  . Heart failure Mother   . Diabetes Father   . Hypertension Father   . Lung cancer Sister    Allergies: Allergies  Allergen Reactions  . Propoxyphene-Acetaminophen Hives, Itching and Swelling    REACTION: Hallucinations   Current Facility-Administered Medications on File Prior to Encounter  Medication Dose Route Frequency Provider Last Rate Last Dose  . pneumococcal 23 valent vaccine (PNU-IMMUNE) injection 0.5 mL  0.5 mL Intramuscular Once Ivy de Lawson Radar, DO       Current Outpatient Prescriptions on File Prior to Encounter  Medication Sig Dispense Refill  . amLODipine (NORVASC) 10 MG tablet Take 10 mg by mouth daily.      Marland Kitchen aspirin EC 81 MG tablet Take 81 mg by mouth daily.        . celecoxib (CELEBREX) 200 MG capsule Take 200 mg by mouth daily.        . citalopram (CELEXA) 20 MG tablet Take 1 tablet (20 mg total) by mouth daily.  31 tablet  3  . cloNIDine (CATAPRES) 0.2 MG tablet Take 0.2 mg by mouth 2 (two) times daily.      . clopidogrel (PLAVIX) 75 MG tablet Take 75 mg by mouth every morning.        Marland Kitchen lisinopril (PRINIVIL,ZESTRIL) 40 MG tablet Take 1 tablet (40 mg total) by mouth daily.  30 tablet  6  . metFORMIN (GLUCOPHAGE) 1000 MG tablet Take 1,000 mg by mouth 2 (two)  times daily.      . nitroGLYCERIN (NITROSTAT) 0.4 MG SL tablet Place 0.4 mg under the tongue every 5 (five) minutes as needed. For chest pain       . Nystatin (NYAMYC) 100000 UNIT/GM POWD Apply topically 4 (four) times daily.  15 g  1  . omeprazole (PRILOSEC) 20 MG capsule Take 20 mg by mouth at bedtime.        . rosuvastatin (CRESTOR) 10 MG tablet Take 10 mg by mouth at bedtime.        Review Of Systems: Per HPI  Physical Exam: BP 135/62  Pulse 73  Temp 97.7 F (36.5  C) (Oral)  Resp 18  SpO2 97% Exam: General: NAD, laying in bed with the lights off HEENT: PER constricted and sluggishly reactive bilaterally, EOMI B/L, red reflex present and equal b/l,  no nystagmus, MMM, no pharyngeal erythema Cardiovascular: RRR, no murmurs appreciated Respiratory: CTA, no increased WOB Abdomen: Soft, NT/ND, NABS, Obese Extremities: No edema present, +2/4 pulses B/L DP and PT Skin: No rashes noted Neuro: AAO x 3, CN 2-12 intact, MS 5/5 UE and LE, sensation intact UE and LE, no gait abnormalities, speech normal, DTR normal.   Labs and Imaging: CBC BMET   Lab 07/07/12 1854  WBC 7.6  HGB 12.5  HCT 38.2  PLT 274    Lab 07/07/12 1854  NA 140  K 3.9  CL 101  CO2 27  BUN 13  CREATININE 0.85  GLUCOSE 127*  CALCIUM 9.7     Hess, Bryan, DO 07/07/2012, 11:12 PM  I examined the patient with Dr. Paulina Fusi. I have reviewed the note, made necessary revisions and agree with above. Briefly, 56 yo F with hx significant for ischemic CVA in 2010, A fib  migraine and metabolic syndrome presents with two days of worsening headache (bilateral temporal and L retrobulbar) associated with nausea and blurred/double vision. No other neurological defects. Negative CT head for ischemia, hemorrhage or bleed. Reassuring exam with no focal neurological deficits. In NSR on EKG and tele with no report of palpitations.   Suspicion is high for mixed tension migraine headache. CVA possible but history and exam does not support  this. Plan to admit for overnight observation and pain control with a combination of reglan and NSAIDs as patient has not tried anything for headache treatment at home. Given hx of CVA and mild CAD will avoid triptans. No carotid dopplers or ECHO at this time. Will continue home medications except for metformin, celebrex (not taking) and NTG (no chest pain).   Chyna Kneece 07/08/2012, 12:01 AM

## 2012-07-07 NOTE — ED Notes (Signed)
Pt c/o generalized HA migraine in origin x 2 days with double vision in left eye upon waking this am; pt sts hx of CVA

## 2012-07-07 NOTE — ED Provider Notes (Signed)
History     CSN: [id number]  Arrival date & time 07/07/12  1811   First MD Initiated Contact with Patient 07/07/12 2033      Chief Complaint  Patient presents with  . Headache  . Diplopia    (Consider location/radiation/quality/duration/timing/severity/associated sxs/prior treatment) The history is provided by the patient.   patient presents with headache the last 2 days. It is dull. It is like her previous headaches. It is in the front  Of her head and throbbing.no localizing numbness or weakness. She states that she now has double vision. She states is double to small things that are relatively close. She's had nauseousness. No relief with medications at home. She has no other numbness or weakness. She has had previous strokes. No fevers. No trauma.   Past Medical History  Diagnosis Date  . Hypertension   . Hypertension     Hypertensive urgency 05/2006  . Atrial fibrillation   . Hypercholesteremia   . 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 Diabetes mellitus     type 2    Past Surgical History  Procedure Date  . Tonsilectomy, adenoidectomy, bilateral myringotomy and tubes age 9  . Dilation and curettage of uterus 1976  . Meniscus repair 03/09    right knee  . Tonsillectomy     Family History  Problem Relation Age of Onset  . Heart failure Mother   . Diabetes Father   . Hypertension Father   . Lung cancer Sister     History  Substance Use Topics  . Smoking status: Never Smoker   . Smokeless tobacco: Not on file  . Alcohol Use: No    OB History    Grav Para Term Preterm Abortions TAB SAB Ect Mult Living                  Review of Systems  Constitutional: Negative for activity change and appetite change.  HENT: Negative for neck stiffness.   Eyes: Positive for photophobia and visual disturbance. Negative for pain.  Respiratory: Negative for chest tightness and shortness of breath.   Cardiovascular: Negative for chest  pain and leg swelling.  Gastrointestinal: Positive for nausea. Negative for vomiting, abdominal pain and diarrhea.  Genitourinary: Negative for flank pain.  Musculoskeletal: Negative for back pain.  Skin: Negative for rash.  Neurological: Positive for headaches. Negative for weakness and numbness.  Psychiatric/Behavioral: Negative for behavioral problems.    Allergies  Propoxyphene-acetaminophen  Home Medications   Current Outpatient Rx  Name  Route  Sig  Dispense  Refill  . AMLODIPINE BESYLATE 10 MG PO TABS   Oral   Take 10 mg by mouth daily.         . ASPIRIN EC 81 MG PO TBEC   Oral   Take 81 mg by mouth daily.           . CELECOXIB 200 MG PO CAPS   Oral   Take 200 mg by mouth daily.           Marland Kitchen CITALOPRAM HYDROBROMIDE 20 MG PO TABS   Oral   Take 1 tablet (20 mg total) by mouth daily.   31 tablet   3   . CLONIDINE HCL 0.2 MG PO TABS   Oral   Take 0.2 mg by mouth 2 (two) times daily.         Marland Kitchen CLOPIDOGREL BISULFATE 75 MG PO TABS   Oral   Take  75 mg by mouth every morning.           Marland Kitchen LISINOPRIL 40 MG PO TABS   Oral   Take 1 tablet (40 mg total) by mouth daily.   30 tablet   6   . METFORMIN HCL 1000 MG PO TABS   Oral   Take 1,000 mg by mouth 2 (two) times daily.         Marland Kitchen METOPROLOL-HYDROCHLOROTHIAZIDE 100-25 MG PO TABS   Oral   Take 1 tablet by mouth daily.         Marland Kitchen NITROGLYCERIN 0.4 MG SL SUBL   Sublingual   Place 0.4 mg under the tongue every 5 (five) minutes as needed. For chest pain          . NYAMYC 100000 UNIT/GM EX POWD      Apply topically 4 (four) times daily.   15 g   1   . OMEPRAZOLE 20 MG PO CPDR   Oral   Take 20 mg by mouth at bedtime.           Marland Kitchen ROSUVASTATIN CALCIUM 10 MG PO TABS   Oral   Take 10 mg by mouth at bedtime.            BP 135/62  Pulse 73  Temp 97.7 F (36.5 C) (Oral)  Resp 18  SpO2 97%  Physical Exam  Nursing note and vitals reviewed. Constitutional: She is oriented to person, place,  and time. She appears well-developed and well-nourished.  HENT:  Head: Normocephalic and atraumatic.  Eyes: EOM are normal. Pupils are equal, round, and reactive to light. Right eye exhibits no discharge. Left eye exhibits no discharge.       Double vision to close small objects.  Neck: Normal range of motion. Neck supple.  Cardiovascular: Normal rate, regular rhythm and normal heart sounds.   No murmur heard. Pulmonary/Chest: Effort normal and breath sounds normal. No respiratory distress. She has no wheezes. She has no rales.  Abdominal: Soft. Bowel sounds are normal. She exhibits no distension. There is no tenderness. There is no rebound and no guarding.  Musculoskeletal: Normal range of motion.  Neurological: She is alert and oriented to person, place, and time. No cranial nerve deficit.       Double vision at close objects but cranial nerves are otherwise intact. Vision is conjugate.  Skin: Skin is warm and dry.  Psychiatric: She has a normal mood and affect. Her speech is normal.    ED Course  Procedures (including critical care time)  Labs Reviewed  CBC WITH DIFFERENTIAL - Abnormal; Notable for the following:    RBC 5.48 (*)     MCV 69.7 (*)     MCH 22.8 (*)     RDW 15.8 (*)     All other components within normal limits  BASIC METABOLIC PANEL - Abnormal; Notable for the following:    Glucose, Bld 127 (*)     GFR calc non Af Amer 75 (*)     GFR calc Af Amer 86 (*)     All other components within normal limits  GLUCOSE, CAPILLARY - Abnormal; Notable for the following:    Glucose-Capillary 120 (*)     All other components within normal limits  PROTIME-INR  POCT I-STAT TROPONIN I   Ct Head Wo Contrast  07/07/2012  *RADIOLOGY REPORT*  Clinical Data: Headache, diplopia and hypertension.  CT HEAD WITHOUT CONTRAST  Technique:  Contiguous axial images were obtained from  the base of the skull through the vertex without contrast.  Comparison: 01/21/2011  Findings: Stable small  vessel disease in the periventricular white matter. The brain demonstrates no evidence of hemorrhage, infarction, edema, mass effect, extra-axial fluid collection, hydrocephalus or mass lesion.  The skull is unremarkable.  IMPRESSION: Stable small vessel disease.  No acute findings.   Original Report Authenticated By: Irish Lack, [name]      1. Headache   2. Diplopia     Date: 07/07/2012  Rate: 77  Rhythm: normal sinus rhythm  QRS Axis: normal  Intervals: normal  ST/T Wave abnormalities: normal  Conduction Disutrbances:none  Narrative Interpretation: non-specific t wave changes.   Old EKG Reviewed: changes noted     MDM  Patient with headache and visual changes. She has had some black sparkles in her right eye also. This possibly could be a complicated migraine, however she's also had previous strokes. She will be admitted to medicine for further evaluation.        Juliet Rude. Rubin Payor, MD 07/07/12 2253

## 2012-07-07 NOTE — ED Notes (Signed)
Physician at bedside.

## 2012-07-08 ENCOUNTER — Encounter (HOSPITAL_COMMUNITY): Payer: Self-pay | Admitting: General Practice

## 2012-07-08 DIAGNOSIS — R51 Headache: Secondary | ICD-10-CM

## 2012-07-08 DIAGNOSIS — H532 Diplopia: Secondary | ICD-10-CM

## 2012-07-08 LAB — BASIC METABOLIC PANEL
BUN: 12 mg/dL (ref 6–23)
Chloride: 101 mEq/L (ref 96–112)
Creatinine, Ser: 0.9 mg/dL (ref 0.50–1.10)
GFR calc Af Amer: 81 mL/min — ABNORMAL LOW (ref >90)
Potassium: 3.5 mEq/L (ref 3.5–5.1)
Sodium: 138 mEq/L (ref 135–145)

## 2012-07-08 LAB — CBC
Platelets: 264 10*3/uL (ref 150–400)
RBC: 5.21 MIL/uL — ABNORMAL HIGH (ref 3.87–5.11)
RDW: 16 % — ABNORMAL HIGH (ref 11.5–15.5)
WBC: 7.5 10*3/uL (ref 4.0–10.5)

## 2012-07-08 LAB — LIPID PANEL
Cholesterol: 124 mg/dL (ref 0–200)
HDL: 38 mg/dL — ABNORMAL LOW (ref >39)
Total CHOL/HDL Ratio: 3.3 RATIO
Triglycerides: 105 mg/dL (ref <150)

## 2012-07-08 LAB — HEMOGLOBIN A1C
Hgb A1c MFr Bld: 7.8 % — ABNORMAL HIGH (ref <5.7)
Mean Plasma Glucose: 177 mg/dL — ABNORMAL HIGH (ref <117)

## 2012-07-08 MED ORDER — SODIUM CHLORIDE 0.9 % IJ SOLN
3.0000 mL | Freq: Two times a day (BID) | INTRAMUSCULAR | Status: DC
  Administered 2012-07-08 – 2012-07-09 (×4): 3 mL via INTRAVENOUS

## 2012-07-08 MED ORDER — CITALOPRAM HYDROBROMIDE 20 MG PO TABS
20.0000 mg | ORAL_TABLET | Freq: Every day | ORAL | Status: DC
  Administered 2012-07-08 – 2012-07-09 (×2): 20 mg via ORAL
  Filled 2012-07-08 (×2): qty 1

## 2012-07-08 MED ORDER — BUTALBITAL-APAP-CAFFEINE 50-325-40 MG PO TABS: 1.0000 | ORAL_TABLET | Freq: Four times a day (QID) | ORAL | Status: DC | PRN

## 2012-07-08 MED ORDER — POLYETHYLENE GLYCOL 3350 17 G PO PACK
17.0000 g | PACK | Freq: Two times a day (BID) | ORAL | Status: DC
  Administered 2012-07-08 – 2012-07-09 (×3): 17 g via ORAL
  Filled 2012-07-08 (×5): qty 1

## 2012-07-08 MED ORDER — NITROGLYCERIN 0.4 MG SL SUBL: 0.4000 mg | SUBLINGUAL_TABLET | SUBLINGUAL | Status: DC | PRN

## 2012-07-08 MED ORDER — AMLODIPINE BESYLATE 10 MG PO TABS
10.0000 mg | ORAL_TABLET | Freq: Every day | ORAL | Status: DC
  Administered 2012-07-08 – 2012-07-09 (×2): 10 mg via ORAL
  Filled 2012-07-08 (×2): qty 1

## 2012-07-08 MED ORDER — PANTOPRAZOLE SODIUM 40 MG PO TBEC
40.0000 mg | DELAYED_RELEASE_TABLET | Freq: Every day | ORAL | Status: DC
  Administered 2012-07-08 – 2012-07-09 (×2): 40 mg via ORAL
  Filled 2012-07-08 (×2): qty 1

## 2012-07-08 MED ORDER — ATORVASTATIN CALCIUM 20 MG PO TABS
20.0000 mg | ORAL_TABLET | Freq: Every day | ORAL | Status: DC
  Administered 2012-07-08: 20 mg via ORAL
  Filled 2012-07-08 (×2): qty 1

## 2012-07-08 MED ORDER — LISINOPRIL 40 MG PO TABS
40.0000 mg | ORAL_TABLET | Freq: Every day | ORAL | Status: DC
Start: 2012-07-08 — End: 2012-07-09
  Administered 2012-07-08 – 2012-07-09 (×2): 40 mg via ORAL
  Filled 2012-07-08 (×2): qty 1

## 2012-07-08 MED ORDER — SODIUM CHLORIDE 0.9 % IJ SOLN: 3.0000 mL | INTRAMUSCULAR | Status: DC | PRN

## 2012-07-08 MED ORDER — NAPROXEN 500 MG PO TABS
500.0000 mg | ORAL_TABLET | Freq: Two times a day (BID) | ORAL | Status: DC
  Administered 2012-07-08 – 2012-07-09 (×2): 500 mg via ORAL
  Filled 2012-07-08 (×6): qty 1

## 2012-07-08 MED ORDER — CELECOXIB 200 MG PO CAPS
200.0000 mg | ORAL_CAPSULE | Freq: Every day | ORAL | Status: DC
  Administered 2012-07-08 – 2012-07-09 (×2): 200 mg via ORAL
  Filled 2012-07-08 (×2): qty 1

## 2012-07-08 MED ORDER — METOPROLOL-HYDROCHLOROTHIAZIDE 100-25 MG PO TABS: 1.0000 | ORAL_TABLET | Freq: Every day | ORAL | Status: DC

## 2012-07-08 MED ORDER — ASPIRIN EC 81 MG PO TBEC
81.0000 mg | DELAYED_RELEASE_TABLET | Freq: Every day | ORAL | Status: DC
  Administered 2012-07-08 – 2012-07-09 (×2): 81 mg via ORAL
  Filled 2012-07-08 (×2): qty 1

## 2012-07-08 MED ORDER — METOPROLOL TARTRATE 100 MG PO TABS
100.0000 mg | ORAL_TABLET | Freq: Every day | ORAL | Status: DC
  Administered 2012-07-08 – 2012-07-09 (×2): 100 mg via ORAL
  Filled 2012-07-08 (×2): qty 1

## 2012-07-08 MED ORDER — HEPARIN SODIUM (PORCINE) 5000 UNIT/ML IJ SOLN
5000.0000 [IU] | Freq: Three times a day (TID) | INTRAMUSCULAR | Status: DC
  Administered 2012-07-08 – 2012-07-09 (×5): 5000 [IU] via SUBCUTANEOUS
  Filled 2012-07-08 (×9): qty 1

## 2012-07-08 MED ORDER — SODIUM CHLORIDE 0.9 % IV SOLN: 250.0000 mL | INTRAVENOUS | Status: DC | PRN

## 2012-07-08 MED ORDER — METOCLOPRAMIDE HCL 10 MG/10ML PO SOLN
10.0000 mg | Freq: Once | ORAL | Status: AC
  Administered 2012-07-08: 10 mg via ORAL
  Filled 2012-07-08: qty 10

## 2012-07-08 MED ORDER — CLOPIDOGREL BISULFATE 75 MG PO TABS
75.0000 mg | ORAL_TABLET | ORAL | Status: DC
  Administered 2012-07-08 – 2012-07-09 (×2): 75 mg via ORAL
  Filled 2012-07-08 (×3): qty 1

## 2012-07-08 MED ORDER — BUTALBITAL-APAP-CAFFEINE 50-325-40 MG PO TABS
1.0000 | ORAL_TABLET | Freq: Once | ORAL | Status: AC
  Administered 2012-07-08: 1 via ORAL
  Filled 2012-07-08: qty 1

## 2012-07-08 MED ORDER — NAPROXEN 500 MG PO TABS
500.0000 mg | ORAL_TABLET | Freq: Once | ORAL | Status: AC
  Administered 2012-07-08: 500 mg via ORAL
  Filled 2012-07-08: qty 1

## 2012-07-08 MED ORDER — KETOROLAC TROMETHAMINE 30 MG/ML IJ SOLN
30.0000 mg | Freq: Once | INTRAMUSCULAR | Status: AC | PRN
  Administered 2012-07-08: 30 mg via INTRAVENOUS
  Filled 2012-07-08: qty 1

## 2012-07-08 MED ORDER — KETOROLAC TROMETHAMINE 30 MG/ML IM SOLN
30.0000 mg | Freq: Once | INTRAMUSCULAR | Status: DC | PRN
  Filled 2012-07-08: qty 1

## 2012-07-08 MED ORDER — CLONIDINE HCL 0.2 MG PO TABS
0.2000 mg | ORAL_TABLET | Freq: Two times a day (BID) | ORAL | Status: DC
  Administered 2012-07-08 – 2012-07-09 (×3): 0.2 mg via ORAL
  Filled 2012-07-08 (×5): qty 1

## 2012-07-08 MED ORDER — HYDROCHLOROTHIAZIDE 25 MG PO TABS
25.0000 mg | ORAL_TABLET | Freq: Every day | ORAL | Status: DC
  Administered 2012-07-08 – 2012-07-09 (×2): 25 mg via ORAL
  Filled 2012-07-08 (×2): qty 1

## 2012-07-08 NOTE — Progress Notes (Signed)
Patient reports (1) fiorcet resolved her pain.   Patient stated that toradol and naproxen brought her headache pain to a 6-7 on a 10 pt scale and wasn't as effective as fiorcet.  Scarlette Shorts

## 2012-07-08 NOTE — Evaluation (Signed)
Physical Therapy Evaluation Patient Details Name: [name] MRN: [id number] DOB: [birth year] Today's Date: 07/08/2012  PT Assessment / Plan / Recommendation Clinical Impression  Pt admitted with diplolpia and blurred vision. Pt near baseline functional level although limited secondary to balance and light sensitivity. Will continue evaluation in further sessions to determine safety upon d/c.     PT Assessment  Patient needs continued PT services    Follow Up Recommendations  No PT follow up    Does the patient have the potential to tolerate intense rehabilitation      Barriers to Discharge        Equipment Recommendations  None recommended by PT    Recommendations for Other Services     Frequency Min 4X/week    Precautions / Restrictions Precautions Precautions: Fall Restrictions Weight Bearing Restrictions: No   Pertinent Vitals/Pain Headache 7/10.       Mobility  Bed Mobility Bed Mobility: Supine to Sit;Sitting - Scoot to [place] of Bed;Sit to Supine Supine to Sit: 4: Min guard Sitting - Scoot to [place] of Bed: 4: Min guard Sit to Supine: 4: Min guard Details for Bed Mobility Assistance: Minguard throughout bed mobility secondary to stability. Pt moves slowly secondary to headache and dizziness Transfers Transfers: Sit to Stand;Stand to Sit Sit to Stand: 4: Min guard;With upper extremity assist;From bed;From toilet Stand to Sit: 4: Min guard;With upper extremity assist;To toilet;To bed Details for Transfer Assistance: Minguard for stability. VC for proper safety. No physical assist needed Ambulation/Gait Ambulation/Gait Assistance: 4: Min guard Ambulation Distance (Feet): 20 Feet Assistive device: Straight cane Ambulation/Gait Assistance Details: Distance limited secondary to fatigue and sensitivity to light.  Gait Pattern: Wide base of support;Step-to pattern Gait velocity: slow Modified Rankin (Stroke Patients Only) Pre-Morbid Rankin Score: Slight  disability Modified Rankin: Moderately severe disability    Shoulder Instructions     Exercises     PT Diagnosis: Difficulty walking;Acute pain  PT Problem List: Decreased strength;Decreased balance;Decreased activity tolerance;Decreased mobility;Decreased knowledge of use of DME;Decreased safety awareness;Decreased knowledge of precautions;Pain PT Treatment Interventions: DME instruction;Gait training;Stair training;Functional mobility training;Therapeutic activities;Therapeutic exercise;Balance training;Neuromuscular re-education;Patient/family education   PT Goals Acute Rehab PT Goals PT Goal Formulation: With patient Time For Goal Achievement: 07/15/12 Potential to Achieve Goals: Good Pt will go Sit to Stand: with modified independence PT Goal: Sit to Stand - Progress: Goal set today Pt will go Stand to Sit: with modified independence PT Goal: Stand to Sit - Progress: Goal set today Pt will Transfer Bed to Chair/Chair to Bed: with modified independence PT Transfer Goal: Bed to Chair/Chair to Bed - Progress: Goal set today Pt will Ambulate: >150 feet;with modified independence;with least restrictive assistive device PT Goal: Ambulate - Progress: Goal set today Pt will Go Up / Down Stairs: 6-9 stairs;with supervision;with rail(s) PT Goal: Up/Down Stairs - Progress: Goal set today  Visit Information  Last PT Received On: 07/08/12 Assistance Needed: +1    Subjective Data  Patient Stated Goal: to feel better   Prior Functioning  Home Living Lives With: Daughter;Son Available Help at Discharge: Family;Available 24 hours/day Type of Home: House Home Access: Stairs to enter [employer] of Steps: 6 Entrance Stairs-Rails: Left;Right;Can reach both Home Layout: One level Bathroom Shower/Tub: Forensic scientist: Standard Bathroom Accessibility: Yes How Accessible: Accessible via walker Home Adaptive Equipment: Straight cane;Shower chair with  back Prior Function Level of Independence: Independent with assistive device(s) Able to Take Stairs?: Yes Driving: No Vocation: On disability Comments: uses cane at  all times Communication Communication: No difficulties Dominant Hand: Right    Cognition  Overall Cognitive Status: Appears within functional limits for tasks assessed/performed Arousal/Alertness: Awake/alert Orientation Level: Appears intact for tasks assessed Behavior During Session: [facility] for tasks performed    Extremity/Trunk Assessment Right Lower Extremity Assessment RLE ROM/Strength/Tone: Deficits RLE ROM/Strength/Tone Deficits: grossly 4/5 RLE Sensation: WFL - Light Touch Left Lower Extremity Assessment LLE ROM/Strength/Tone: Deficits LLE ROM/Strength/Tone Deficits: grossly 4/5 LLE Sensation: WFL - Light Touch   Balance    End of Session PT - End of Session Activity Tolerance: Treatment limited secondary to agitation Patient left: in bed;with call bell/phone within reach;with bed alarm set Nurse Communication: Mobility status  GP Functional Assessment Tool Used: clinical judgement Functional Limitation: Mobility: Walking and moving around Mobility: Walking and Moving Around Current Status (B1478): At least 1 percent but less than 20 percent impaired, limited or restricted Mobility: Walking and Moving Around Goal Status [phone]): 0 percent impaired, limited or restricted   Milana Kidney 07/08/2012, 3:55 PM  07/08/2012 Milana Kidney DPT PAGER: [phone] OFFICE: [phone]

## 2012-07-08 NOTE — H&P (Signed)
FMTS Attending Admit Note Patient seen and examined by me, discussed with resident team and I agree with plan. Patient presents with complaint of visual disturbances and headache, coronal region and midline.  Some associated nausea, no emesis.  Questionable photophobia.  Onset 2 days before presentation.  Similar to usual migraines with exception of persistence (and worsening) after sleep.  No motor weakness or slurred speech by history.  At the time of my initial exam (930am) she reports that the headache is mildly better than on admission, but still present.   Visual disturbance described as 'double vision' when she looks straight ahead, not to either side.  Sees "one-and-a-half fingers" when I hold up one finger in front of face.  She uses no corrective eyewear at baseline.  EOMI; she describes no double vision or blurriness when EOM testing to either side, or up and down.  No ptosis.   Assess/Plan Patient with known headache history, as well as stroke and PAF/HTN history, with presentation of persistent headache and visual disturbances that are not classic for true diplopia from EOM causes.  Head CT negative for bleed or mass effect.  For continued treatment of HA with avoidance of triptans in setting of known vascular disease.  If HA remits, for consideration for discharge home with follow up.  If HA persists despite continued analgesia, then consider MRI imaging for evidence of stroke/mass that may not be visible on CT.  Paula Compton, MD

## 2012-07-08 NOTE — Progress Notes (Signed)
PGY-1 Daily Progress Note Family Medicine Teaching Service Carla Waynoka R. Khalifa Knecht, DO Service Pager: [phone]   Subjective: Pt states she is feeling better after receiving her Naproxen this AM.  Still has a HA but improved and her diplopia is improved.    Objective:  VITALS Temp:  [97.3 F (36.3 C)-98 F (36.7 C)] 98 F (36.7 C) (12/05 0545) Pulse Rate:  [69-87] 69  (12/05 0545) Resp:  [18] 18  (12/05 0545) BP: (109-147)/(62-81) 109/68 mmHg (12/05 0545) SpO2:  [95 %-100 %] 99 % (12/05 0545) Weight:  [265 lb 3.4 oz (120.3 kg)] 265 lb 3.4 oz (120.3 kg) (12/05 0102)  In/Out No intake or output data in the 24 hours ending 07/08/12 0925  Physical Exam: General: NAD HEENT: PERRLA, EOMI B/L,no nystagmus Cardiovascular: RRR, no murmurs appreciated  Respiratory: CTA, no increased WOB  Abdomen: Soft, NT/ND, NABS, Obese  Extremities: No edema present, +2/4 pulses B/L DP and PT  Skin: No rashes noted  Neuro: AAO x 3, CN 2-12 intact, speech normal, DTR normal.    MEDS Scheduled Meds:   . amLODipine  10 mg Oral Daily  . aspirin EC  81 mg Oral Daily  . atorvastatin  20 mg Oral q1800  . celecoxib  200 mg Oral Daily  . citalopram  20 mg Oral Daily  . cloNIDine  0.2 mg Oral BID  . clopidogrel  75 mg Oral BH-q7a  . heparin  5,000 Units Subcutaneous Q8H  . hydrochlorothiazide  25 mg Oral Daily  . lisinopril  40 mg Oral Daily  . [COMPLETED] metoCLOPramide  10 mg Oral Once  . metoprolol tartrate  100 mg Oral Daily  . naproxen  500 mg Oral BID WC  . [COMPLETED] naproxen  500 mg Oral Once  . pantoprazole  40 mg Oral Daily  . polyethylene glycol  17 g Oral BID  . [COMPLETED] promethazine  25 mg Intravenous Once  . [COMPLETED] sodium chloride  500 mL Intravenous Once  . sodium chloride  3 mL Intravenous Q12H  . [DISCONTINUED] metoprolol-hydrochlorothiazide  1 tablet Oral Daily   Continuous Infusions:  PRN Meds:.sodium chloride, nitroGLYCERIN, sodium chloride  Labs and imaging:    CBC  Lab 07/08/12 0550 07/07/12 1854  WBC 7.5 7.6  HGB 11.6* 12.5  HCT 36.5 38.2  PLT 264 274   BMET/CMET  Lab 07/08/12 0550 07/07/12 1854  NA 138 140  K 3.5 3.9  CL 101 101  CO2 27 27  BUN 12 13  CREATININE 0.90 0.85  CALCIUM 9.4 9.7  PROT -- --  BILITOT -- --  ALKPHOS -- --  ALT -- --  AST -- --  GLUCOSE 179* 127*   Results for orders placed during the hospital encounter of 07/07/12 (from the past 24 hour(s))  GLUCOSE, CAPILLARY     Status: Abnormal   Collection Time   07/07/12  6:35 PM      Component Value Range   Glucose-Capillary 120 (*) 70 - 99 mg/dL  CBC WITH DIFFERENTIAL     Status: Abnormal   Collection Time   07/07/12  6:54 PM      Component Value Range   WBC 7.6  4.0 - 10.5 K/uL   RBC 5.48 (*) 3.87 - 5.11 MIL/uL   Hemoglobin 12.5  12.0 - 15.0 g/dL   HCT 45.4  09.8 - 11.9 %   MCV 69.7 (*) 78.0 - 100.0 fL   MCH 22.8 (*) 26.0 - 34.0 pg   MCHC 32.7  30.0 -  36.0 g/dL   RDW 40.9 (*) 81.1 - 91.4 %   Platelets 274  150 - 400 K/uL   Neutrophils Relative 49  43 - 77 %   Lymphocytes Relative 43  12 - 46 %   Monocytes Relative 7  3 - 12 %   Eosinophils Relative 1  0 - 5 %   Basophils Relative 0  0 - 1 %   Neutro Abs 3.7  1.7 - 7.7 K/uL   Lymphs Abs 3.3  0.7 - 4.0 K/uL   Monocytes Absolute 0.5  0.1 - 1.0 K/uL   Eosinophils Absolute 0.1  0.0 - 0.7 K/uL   Basophils Absolute 0.0  0.0 - 0.1 K/uL   Smear Review MORPHOLOGY UNREMARKABLE    BASIC METABOLIC PANEL     Status: Abnormal   Collection Time   07/07/12  6:54 PM      Component Value Range   Sodium 140  135 - 145 mEq/L   Potassium 3.9  3.5 - 5.1 mEq/L   Chloride 101  96 - 112 mEq/L   CO2 27  19 - 32 mEq/L   Glucose, Bld 127 (*) 70 - 99 mg/dL   BUN 13  6 - 23 mg/dL   Creatinine, Ser 7.82  0.50 - 1.10 mg/dL   Calcium 9.7  8.4 - 95.6 mg/dL   GFR calc non Af Amer 75 (*) >90 mL/min   GFR calc Af Amer 86 (*) >90 mL/min  PROTIME-INR     Status: Normal   Collection Time   07/07/12  6:54 PM      Component  Value Range   Prothrombin Time 13.5  11.6 - 15.2 seconds   INR 1.04  0.00 - 1.49  POCT I-STAT TROPONIN I     Status: Normal   Collection Time   07/07/12  7:11 PM      Component Value Range   Troponin i, poc 0.00  0.00 - 0.08 ng/mL   Comment 3           CBC     Status: Abnormal   Collection Time   07/08/12  5:50 AM      Component Value Range   WBC 7.5  4.0 - 10.5 K/uL   RBC 5.21 (*) 3.87 - 5.11 MIL/uL   Hemoglobin 11.6 (*) 12.0 - 15.0 g/dL   HCT 21.3  08.6 - 57.8 %   MCV 70.1 (*) 78.0 - 100.0 fL   MCH 22.3 (*) 26.0 - 34.0 pg   MCHC 31.8  30.0 - 36.0 g/dL   RDW 46.9 (*) 62.9 - 52.8 %   Platelets 264  150 - 400 K/uL  BASIC METABOLIC PANEL     Status: Abnormal   Collection Time   07/08/12  5:50 AM      Component Value Range   Sodium 138  135 - 145 mEq/L   Potassium 3.5  3.5 - 5.1 mEq/L   Chloride 101  96 - 112 mEq/L   CO2 27  19 - 32 mEq/L   Glucose, Bld 179 (*) 70 - 99 mg/dL   BUN 12  6 - 23 mg/dL   Creatinine, Ser 4.13  0.50 - 1.10 mg/dL   Calcium 9.4  8.4 - 24.4 mg/dL   GFR calc non Af Amer 70 (*) >90 mL/min   GFR calc Af Amer 81 (*) >90 mL/min  LIPID PANEL     Status: Abnormal   Collection Time   07/08/12  5:50 AM  Component Value Range   Cholesterol 124  0 - 200 mg/dL   Triglycerides 161  <096 mg/dL   HDL 38 (*) >04 mg/dL   Total CHOL/HDL Ratio 3.3     VLDL 21  0 - 40 mg/dL   LDL Cholesterol 65  0 - 99 mg/dL   Ct Head Wo Contrast  07/07/2012  IMPRESSION: Stable small vessel disease.  No acute findings.   Original Report Authenticated By: [name], [name]         Assessment  [name] is a 57 y.o. year old female with PMHx significant for previous CVA, paroxysmal A fib, HTN, and Migraines presenting with diplopia concerning for new CVA.   1. Diplopia (Most likely Migraine) but cannot exclude TIA vs CVA 1. Pt with previous CVA on Plavix and ASA. Continues to have minor diplopia, maybe improving but no focal neurologic deficits.  Most likely Migraine  Sx.  Will have cardiology come to evaluate medical regimen and possibly send home on Migraine Tx vs PPx.  Gave pt option of either going to see [name] or possibly neurology for management.  States she has not had management previously for her migraines.  2. CT scan in ED did not show acute changes and EKG was unchanged from previous .  3. TC 124, LDL 65.  A1C pending along with AM EKG.   4. Telemetry overnight, PT/OT evaluation in the AM. Pt able to tolerate liquids in ED. Will give heart healthy diet with precautions. 5. Continue home Plavix and ASA. Neurology was consulted in ED and consider further evaluation/management. 6. Did Test Visual Fields which were full by confrontation.  Also visual acuity 20/25 in L eye and 20/25 in R eye without diplopia.  Will try shot of Toradol to see if this improves her HA. If has worsening or develops acute neurologic deficits, will go ahead with MRI.   2. DM II, well controlled - Pt BMP on admission showed glucose of 127 and her last A1C in September was 7.6  1. Consider restarting Metformin or SSI if pt stays today.  BMP from today showed Glucose of 173 fasting.  3. Hyperlipidemia - Stable 1. Continue home Crestor  4. HTN - Stable in ED.  1. Continue home medications  5. Hx of Paroxsymal A fib - EKG showed NSR in ED  1. No events on telemetry overnight. EKG from AM pending 2. Spoke with Cardiology on call.  They will come to evaluate for further medical management.  Greatly appreciate their input.   6. Constpation - Last BM reported to be last week  1. Miralax BID  7. FEN/GI: Heart Healthy Diet, Protonix 8. Prophylaxis: Heparin SQ 9. Disposition: Pending Clinical improvement and work up 10. Code Status: Full  Daxtin Leiker R. Ariyona Eid, DO of [facility] 07/08/2012, 9:30 AM

## 2012-07-08 NOTE — Progress Notes (Signed)
Nutrition Brief Note  Patient identified on the Malnutrition Screening Tool (MST) Report. Pt reports that she has lost about 10 lb due to eliminating soda and junk food at Breakfast and Lunch. Pt has only been drinking coffee. Pt states that she knows what to do to lose weight but she prefers quick and easy meals. We reviewed importance of balanced meals and some quick healthy options to add back to Breakfast and Lunch. Pt receptive but declines detailed education. Pt continues to have headache.   Body mass index is 48.51 kg/(m^2). 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   Current diet order is Heart Healthy, patient is consuming approximately 100% of meals at this time. Labs and medications reviewed.   No nutrition interventions warranted at this time. If nutrition issues arise, please consult RD.   Kendell Bane RD, LDN, CNSC [phone] Pager [phone] After Hours Pager

## 2012-07-08 NOTE — Discharge Summary (Signed)
Physician Discharge Summary  Patient ID: [name] MRN: [id number] DOB: Feb 26, 1956 Age: 56 y.o.  Admit date: 07/07/2012 Discharge date: 07/11/2012 Admitting Physician: Barbaraann Barthel, MD  PCP: DE LA CRUZ,IVY, DO  Consultants:Cardiology      Discharge Diagnosis:  Principal Problem:  *Headache Active Problems:  DIABETES MELLITUS, TYPE II  HYPERLIPIDEMIA  HYPERTENSION  Atrial fibrillation  History of CVA (cerebrovascular accident)  Constipation    Hospital Course [name] is a 56 y.o. year old female with PMHx significant for previous CVA, paroxysmal A fib, HTN, and Migraines presenting with diplopia concerning for new CVA.   1. Migraine vs TIA - Pt presented to the ED with several day history of weakness and diplopia.  In the ED, pt had extensive w/u including a BMP (WNL), CBC (WNL), and a CT scan not showing acute changes along with an EKG that was unchanged from previous.  She was given one dose of phenergan to help with her nausea along with a 500 cc bolus, which helped some of her Sx.  Before admission, neurology was consulted in the ED and felt she needed to come in to r/o possible new onset CVA.  Pt was admitted to the floor for further evaluation,observation, and w/u.  She was placed on telemetry during her stay which did not show acute events and had a lipid profile (TC 124 LDL 65) and A1C (7.8) with EKG repeated for risk stratification.  This event was felt to be more likely a migraine episode rather than CVA due to no focal neurologic deficits and her visual fields were full by confrontation and visual acuity was 20/25 L eye and 20/25 R eye without diplopia.  She was tried on one dose of Toradol, which did not help her HA.  She was then tried on Fioricet which did help her HA and pt was no longer c/o diplopia.  She was started on Topamax 25 mg qd for Migraine PPx and given a limited supply of Fioricet before d/c.  Pt was medically stable at d/c and had no focal  neurologic deficits.  2. DM II, well controlled -Pt  had an A1C done showing 7.8.  Her metformin was held due to possible testing with contrast but was restarted upon d/c.  3. Hyperlipidemia - Stable, TC 124 on admission.  Continued home Crestor.  4. HTN - Stable - Continued home medications.  5. Hx of Paroxsymal A fib - Pt EKG in ED showed NSR and repeat the following AM showed NSR.  Cardiology was consulted to possible adjust her medications in regards to adding on Topamax for her Migraine PPx.  Cardiology had no further recommendations and signed off. 6. Constpation - Pt reported several day history of constipation.  She was given Miralax BID while in the hospital and did end up having a BM.        Discharge PE   Filed Vitals:   07/09/12 1012  BP: 120/70  Pulse: 62  Temp: 97.8 F (36.6 C)  Resp: 18   General: NAD  HEENT: PERRLA, EOMI B/L,no nystagmus  Cardiovascular: RRR, no murmurs appreciated  Respiratory: CTA, no increased WOB  Abdomen: Soft, NT/ND, NABS, Obese  Extremities: No edema present, +2/4 pulses B/L DP and PT  Neuro: AAO x 3, CN 2-12 intact     Procedures/Imaging:  Ct Head Wo Contrast  07/07/2012   IMPRESSION: Stable small vessel disease.  No acute findings.   Original Report Authenticated By: Irish Lack, [name]  Labs  CBC  Lab 07/08/12 0550 07/07/12 1854  WBC 7.5 7.6  HGB 11.6* 12.5  HCT 36.5 38.2  PLT 264 274   BMET  Lab 07/08/12 0550 07/07/12 1854  NA 138 140  K 3.5 3.9  CL 101 101  CO2 27 27  BUN 12 13  CREATININE 0.90 0.85  CALCIUM 9.4 9.7  PROT -- --  BILITOT -- --  ALKPHOS -- --  ALT -- --  AST -- --  GLUCOSE 179* 127*   LIPID PANEL     Status: Abnormal   Collection Time   07/08/12  5:50 AM      Component Value Range Comment   Cholesterol 124  0 - 200 mg/dL    Triglycerides 440  <347 mg/dL    HDL 38 (*) >42 mg/dL    Total CHOL/HDL Ratio 3.3      VLDL 21  0 - 40 mg/dL    LDL Cholesterol 65  0 - 99 mg/dL      Lab Results   Component Value Date   HGBA1C 7.8* 07/08/2012      Patient condition at time of discharge/disposition: stable  Disposition-home   Follow up issues: 1. F/U Migraine PPx with Topamax and Fioricet abortive meds 2. Consider d/c her ASA for her CVA as already on Plavix.  Discharge Instructions: Please refer to Patient Instructions section of EMR for full details.  Patient was counseled important signs and symptoms that should prompt return to medical care, changes in medications, dietary instructions, activity restrictions, and follow up appointments.  Significant instructions noted below:    Discharge Medications   Medication List     As of 07/11/2012 10:17 AM    START taking these medications         butalbital-acetaminophen-caffeine 50-325-40 MG per tablet   Commonly known as: FIORICET, ESGIC   Take 1 tablet by mouth every 6 (six) hours as needed for headache.      topiramate 50 MG tablet   Commonly known as: TOPAMAX   Take 0.5 tablets (25 mg total) by mouth 2 (two) times daily.      CONTINUE taking these medications         amLODipine 10 MG tablet   Commonly known as: NORVASC      aspirin EC 81 MG tablet      celecoxib 200 MG capsule   Commonly known as: CELEBREX      citalopram 20 MG tablet   Commonly known as: CELEXA   Take 1 tablet (20 mg total) by mouth daily.      cloNIDine 0.2 MG tablet   Commonly known as: CATAPRES      clopidogrel 75 MG tablet   Commonly known as: PLAVIX      lisinopril 40 MG tablet   Commonly known as: PRINIVIL,ZESTRIL   Take 1 tablet (40 mg total) by mouth daily.      metFORMIN 1000 MG tablet   Commonly known as: GLUCOPHAGE      metoprolol-hydrochlorothiazide 100-25 MG per tablet   Commonly known as: LOPRESSOR HCT      nitroGLYCERIN 0.4 MG SL tablet   Commonly known as: NITROSTAT      NYAMYC 100000 UNIT/GM Powd   Apply topically 4 (four) times daily.      omeprazole 20 MG capsule   Commonly known as: PRILOSEC       rosuvastatin 10 MG tablet   Commonly known as: CRESTOR  Where to get your medications    These are the prescriptions that you need to pick up. We sent them to a specific pharmacy, so you will need to go there to get them.   [facility] - [place], [place] - [address]    [address] [address]    Phone: [phone]        topiramate 50 MG tablet         You may get these medications from any pharmacy.         butalbital-acetaminophen-caffeine 50-325-40 MG per tablet                Gildardo Cranker, DO of [facility] 07/11/2012 10:20 AM

## 2012-07-08 NOTE — Progress Notes (Signed)
FMTS Attending Note Patient re-evaluated by me @1300hrs , continues headache but mildly better than before.  Agree with escalation of analgesic therapy, for MRI if not improved. Paula Compton, MD

## 2012-07-08 NOTE — Consult Note (Signed)
[name] is an 56 y.o. female.   Chief Complaint: Headache and diplopia.  HPI: 56 years old female with stroke last year has recurrent diplopia and headache. She has paroxysmal atrial fibrillation and takes aspirin, Plavix, lisinopril, amlodipine, clonidine and Crestor along with metformin. Stable vital signs and in sinus rhythm.  Past Medical History  Diagnosis Date  . Hypertension   . Hypertension     Hypertensive urgency 05/2006  . Atrial fibrillation   . Hypercholesteremia   . 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 Diabetes mellitus     type 2      Past Surgical History  Procedure Date  . Tonsilectomy, adenoidectomy, bilateral myringotomy and tubes age 47  . Dilation and curettage of uterus 1976  . Meniscus repair 03/09    right knee  . Tonsillectomy     Family History  Problem Relation Age of Onset  . Heart failure Mother   . Diabetes Father   . Hypertension Father   . Lung cancer Sister    Social History:  reports that she has never smoked. She does not have any smokeless tobacco history on file. She reports that she does not drink alcohol or use illicit drugs.  Allergies:  Allergies  Allergen Reactions  . Propoxyphene-Acetaminophen Hives, Itching and Swelling    REACTION: Hallucinations    Medications Prior to Admission  Medication Sig Dispense Refill  . amLODipine (NORVASC) 10 MG tablet Take 10 mg by mouth daily.      Marland Kitchen aspirin EC 81 MG tablet Take 81 mg by mouth daily.        . celecoxib (CELEBREX) 200 MG capsule Take 200 mg by mouth daily.        . citalopram (CELEXA) 20 MG tablet Take 1 tablet (20 mg total) by mouth daily.  31 tablet  3  . cloNIDine (CATAPRES) 0.2 MG tablet Take 0.2 mg by mouth 2 (two) times daily.      . clopidogrel (PLAVIX) 75 MG tablet Take 75 mg by mouth every morning.        Marland Kitchen lisinopril (PRINIVIL,ZESTRIL) 40 MG tablet Take 1 tablet (40 mg total) by mouth daily.  30 tablet  6  . metFORMIN  (GLUCOPHAGE) 1000 MG tablet Take 1,000 mg by mouth 2 (two) times daily.      . metoprolol-hydrochlorothiazide (LOPRESSOR HCT) 100-25 MG per tablet Take 1 tablet by mouth daily.      . nitroGLYCERIN (NITROSTAT) 0.4 MG SL tablet Place 0.4 mg under the tongue every 5 (five) minutes as needed. For chest pain       . Nystatin (NYAMYC) 100000 UNIT/GM POWD Apply topically 4 (four) times daily.  15 g  1  . omeprazole (PRILOSEC) 20 MG capsule Take 20 mg by mouth at bedtime.        . rosuvastatin (CRESTOR) 10 MG tablet Take 10 mg by mouth at bedtime.         Results for orders placed during the hospital encounter of 07/07/12 (from the past 48 hour(s))  GLUCOSE, CAPILLARY     Status: Abnormal   Collection Time   07/07/12  6:35 PM      Component Value Range Comment   Glucose-Capillary 120 (*) 70 - 99 mg/dL   CBC WITH DIFFERENTIAL     Status: Abnormal   Collection Time   07/07/12  6:54 PM      Component Value Range Comment   WBC 7.6  4.0 - 10.5 K/uL    RBC 5.48 (*) 3.87 - 5.11 MIL/uL    Hemoglobin 12.5  12.0 - 15.0 g/dL    HCT 09.8  11.9 - 14.7 %    MCV 69.7 (*) 78.0 - 100.0 fL    MCH 22.8 (*) 26.0 - 34.0 pg    MCHC 32.7  30.0 - 36.0 g/dL    RDW 82.9 (*) 56.2 - 15.5 %    Platelets 274  150 - 400 K/uL    Neutrophils Relative 49  43 - 77 %    Lymphocytes Relative 43  12 - 46 %    Monocytes Relative 7  3 - 12 %    Eosinophils Relative 1  0 - 5 %    Basophils Relative 0  0 - 1 %    Neutro Abs 3.7  1.7 - 7.7 K/uL    Lymphs Abs 3.3  0.7 - 4.0 K/uL    Monocytes Absolute 0.5  0.1 - 1.0 K/uL    Eosinophils Absolute 0.1  0.0 - 0.7 K/uL    Basophils Absolute 0.0  0.0 - 0.1 K/uL    Smear Review MORPHOLOGY UNREMARKABLE     BASIC METABOLIC PANEL     Status: Abnormal   Collection Time   07/07/12  6:54 PM      Component Value Range Comment   Sodium 140  135 - 145 mEq/L    Potassium 3.9  3.5 - 5.1 mEq/L    Chloride 101  96 - 112 mEq/L    CO2 27  19 - 32 mEq/L    Glucose, Bld 127 (*) 70 - 99 mg/dL     BUN 13  6 - 23 mg/dL    Creatinine, Ser 1.30  0.50 - 1.10 mg/dL    Calcium 9.7  8.4 - 86.5 mg/dL    GFR calc non Af Amer 75 (*) >90 mL/min    GFR calc Af Amer 86 (*) >90 mL/min   PROTIME-INR     Status: Normal   Collection Time   07/07/12  6:54 PM      Component Value Range Comment   Prothrombin Time 13.5  11.6 - 15.2 seconds    INR 1.04  0.00 - 1.49   POCT I-STAT TROPONIN I     Status: Normal   Collection Time   07/07/12  7:11 PM      Component Value Range Comment   Troponin i, poc 0.00  0.00 - 0.08 ng/mL    Comment 3            HEMOGLOBIN A1C     Status: Abnormal   Collection Time   07/08/12  5:50 AM      Component Value Range Comment   Hemoglobin A1C 7.8 (*) <5.7 %    Mean Plasma Glucose 177 (*) <117 mg/dL   CBC     Status: Abnormal   Collection Time   07/08/12  5:50 AM      Component Value Range Comment   WBC 7.5  4.0 - 10.5 K/uL    RBC 5.21 (*) 3.87 - 5.11 MIL/uL    Hemoglobin 11.6 (*) 12.0 - 15.0 g/dL    HCT 78.4  69.6 - 29.5 %    MCV 70.1 (*) 78.0 - 100.0 fL    MCH 22.3 (*) 26.0 - 34.0 pg    MCHC 31.8  30.0 - 36.0 g/dL    RDW 28.4 (*) 13.2 - 15.5 %    Platelets 264  150 - 400 K/uL   BASIC METABOLIC PANEL     Status: Abnormal   Collection Time   07/08/12  5:50 AM      Component Value Range Comment   Sodium 138  135 - 145 mEq/L    Potassium 3.5  3.5 - 5.1 mEq/L    Chloride 101  96 - 112 mEq/L    CO2 27  19 - 32 mEq/L    Glucose, Bld 179 (*) 70 - 99 mg/dL    BUN 12  6 - 23 mg/dL    Creatinine, Ser 6.57  0.50 - 1.10 mg/dL    Calcium 9.4  8.4 - 84.6 mg/dL    GFR calc non Af Amer 70 (*) >90 mL/min    GFR calc Af Amer 81 (*) >90 mL/min   LIPID PANEL     Status: Abnormal   Collection Time   07/08/12  5:50 AM      Component Value Range Comment   Cholesterol 124  0 - 200 mg/dL    Triglycerides 962  <952 mg/dL    HDL 38 (*) >84 mg/dL    Total CHOL/HDL Ratio 3.3      VLDL 21  0 - 40 mg/dL    LDL Cholesterol 65  0 - 99 mg/dL    Ct Head Wo Contrast  07/07/2012   *RADIOLOGY REPORT*  Clinical Data: Headache, diplopia and hypertension.  CT HEAD WITHOUT CONTRAST  Technique:  Contiguous axial images were obtained from the base of the skull through the vertex without contrast.  Comparison: 01/21/2011  Findings: Stable small vessel disease in the periventricular white matter. The brain demonstrates no evidence of hemorrhage, infarction, edema, mass effect, extra-axial fluid collection, hydrocephalus or mass lesion.  The skull is unremarkable.  IMPRESSION: Stable small vessel disease.  No acute findings.   Original Report Authenticated By: Irish Lack, [name]     @ROS @ + weight gain, + DM, II, + vision change, No speech problem, + A. Fib, + Obesity, + arthritis, + headache + lun cancer + hypertension. No GI bleed, + stroke, No seizures. Blood pressure 124/60, pulse 71, temperature 97.8 F (36.6 C), temperature source Oral, resp. rate 20, height 5\' 2"  (1.575 m), weight 120.3 kg (265 lb 3.4 oz), SpO2 98.00%.  General: NAD, well built and overnourished HEENT: Brown eyes, left eye moves slightly slower in lateral gaze.  Cardiovascular: RRR, II/VI systolic murmur. Respiratory: CTA, bil.  Abdomen: Soft, NT/ND, NABS, Obese  Extremities: Trace edema present, +2/4 pulses B/L DP and PT  Skin: No rashes noted  Neuro: AAO x 3, CN 2-12 intact, moves all 4 ext.  Assessment/Plan Paroxysmal A. Fibrillation.- (currently sinus rhythm) HTN DM, II S/P CVA with vision trouble Obesity Migraine headache,  OK to use Plavix and NSAIDs as needed. Follow up as needed  Novak Stgermaine S 07/08/2012, 1:17 PM

## 2012-07-09 MED ORDER — BUTALBITAL-APAP-CAFFEINE 50-325-40 MG PO TABS: 1.0000 | ORAL_TABLET | Freq: Four times a day (QID) | ORAL | Status: DC | PRN

## 2012-07-09 MED ORDER — METFORMIN HCL 500 MG PO TABS
1000.0000 mg | ORAL_TABLET | Freq: Two times a day (BID) | ORAL | Status: DC
  Administered 2012-07-09: 1000 mg via ORAL
  Filled 2012-07-09 (×3): qty 2

## 2012-07-09 MED ORDER — METFORMIN HCL 500 MG PO TABS: 1000.0000 mg | ORAL_TABLET | Freq: Two times a day (BID) | ORAL | Status: DC

## 2012-07-09 MED ORDER — TOPIRAMATE 50 MG PO TABS: 25.0000 mg | ORAL_TABLET | Freq: Two times a day (BID) | ORAL | Status: DC

## 2012-07-09 NOTE — Progress Notes (Signed)
PGY-1 Daily Progress Note Family Medicine Teaching Service [place] R. Jelan Batterton, DO Service Pager: [phone]   Subjective: Pt states the Fioricet helped her yesterday. No changes in her visual fields or diplopia this AM.  Ready to go home  Objective:  VITALS Temp:  [97.7 F (36.5 C)-98.2 F (36.8 C)] 97.9 F (36.6 C) (12/06 0548) Pulse Rate:  [54-71] 61  (12/06 0548) Resp:  [18-20] 18  (12/06 0548) BP: (95-124)/(59-75) 122/75 mmHg (12/06 0548) SpO2:  [92 %-100 %] 100 % (12/06 0548)  In/Out  Intake/Output Summary (Last 24 hours) at 07/09/12 0819 Last data filed at 07/08/12 1422  Gross per 24 hour  Intake    840 ml  Output      0 ml  Net    840 ml    Physical Exam: General: NAD HEENT: PERRLA, EOMI B/L,no nystagmus Cardiovascular: RRR, no murmurs appreciated  Respiratory: CTA, no increased WOB  Abdomen: Soft, NT/ND, NABS, Obese  Extremities: No edema present, +2/4 pulses B/L DP and PT  Skin: No rashes noted  Neuro: AAO x 3, CN 2-12 intact   MEDS Scheduled Meds:    . amLODipine  10 mg Oral Daily  . aspirin EC  81 mg Oral Daily  . atorvastatin  20 mg Oral q1800  . [COMPLETED] butalbital-acetaminophen-caffeine  1 tablet Oral Once  . celecoxib  200 mg Oral Daily  . citalopram  20 mg Oral Daily  . cloNIDine  0.2 mg Oral BID  . clopidogrel  75 mg Oral BH-q7a  . heparin  5,000 Units Subcutaneous Q8H  . hydrochlorothiazide  25 mg Oral Daily  . lisinopril  40 mg Oral Daily  . metoprolol tartrate  100 mg Oral Daily  . naproxen  500 mg Oral BID WC  . pantoprazole  40 mg Oral Daily  . polyethylene glycol  17 g Oral BID  . sodium chloride  3 mL Intravenous Q12H   Continuous Infusions:  PRN Meds:.sodium chloride, butalbital-acetaminophen-caffeine, [COMPLETED] ketorolac, nitroGLYCERIN, sodium chloride, [DISCONTINUED] ketorolac  Labs and imaging:   CBC  Lab 07/08/12 0550 07/07/12 1854  WBC 7.5 7.6  HGB 11.6* 12.5  HCT 36.5 38.2  PLT 264 274   BMET/CMET  Lab 07/08/12  0550 07/07/12 1854  NA 138 140  K 3.5 3.9  CL 101 101  CO2 27 27  BUN 12 13  CREATININE 0.90 0.85  CALCIUM 9.4 9.7  PROT -- --  BILITOT -- --  ALKPHOS -- --  ALT -- --  AST -- --  GLUCOSE 179* 127*   No results found for this or any previous visit (from the past 24 hour(s)). Ct Head Wo Contrast  07/07/2012  IMPRESSION: Stable small vessel disease.  No acute findings.   Original Report Authenticated By: Irish Lack, [name]     Assessment  [name] is a 56 y.o. year old female with PMHx significant for previous CVA, paroxysmal A fib, HTN, and Migraines presenting with diplopia concerning for new CVA.   1. Migraine vs TIA 1. Pt Sx resolved with Fioricet yesterday.  Did receive dose of Toradol without much improvement.  Most likely Migraines in which she will need PPx.  Can be managed by Dr. Tye Savoy or can send her to neuro clinic for management.  This may be a good option as well to help with her medications for previous CVA (Plavix alone more beneficial than ASA + Plavix)  2. CT scan in ED did not show acute changes and EKG was  unchanged from previous .  3. TC 124, LDL 65.  A1C 7.8.  EKG unchanged  4. Did Test Visual Fields which were full by confrontation.  Also visual acuity 20/25 in L eye and 20/25 in R eye without diplopia.  2. DM II, well controlled -Pt A1C 7.8  1. Restarting Metformin today.  3. Hyperlipidemia - Stable, TC 124 1. Continue home Crestor  4. HTN - Stable in ED.  1. Continue home medications  5. Hx of Paroxsymal A fib - EKG showed NSR in ED  1. Cardiology evaluated pt yesterday.  Appreciate their input and they have no further changes in her medications.  6. Constpation - Last BM reported to be last week  1. Miralax BID  7. FEN/GI: Heart Healthy Diet, Protonix 8. Prophylaxis: Heparin SQ 9. Disposition: Stable for D/C today  10. Code Status: Full  Glendon Fiser R. Paulina Fusi, DO of [facility] 07/09/2012, 7:55 AM

## 2012-07-09 NOTE — Progress Notes (Signed)
Physical Therapy Treatment Patient Details Name: [name] MRN: [id number] DOB: [birth year] Today's Date: 07/09/2012 Time: 1015-1029 PT Time Calculation (min): 14 min  PT Assessment / Plan / Recommendation Comments on Treatment Session  Pt moving well this session, no double vision or headache. Pt is at her baseline functional level, no further PT needs.    Follow Up Recommendations  No PT follow up     Does the patient have the potential to tolerate intense rehabilitation     Barriers to Discharge        Equipment Recommendations       Recommendations for Other Services    Frequency     Plan All goals met and education completed, patient dischaged from PT services    Precautions / Restrictions Precautions Precautions: Fall Restrictions Weight Bearing Restrictions: No   Pertinent Vitals/Pain No complaints of pain     Mobility  Bed Mobility Bed Mobility: Supine to Sit;Sitting - Scoot to Edge of Bed Supine to Sit: 7: Independent Sitting - Scoot to [place] of Bed: 7: Independent Transfers Transfers: Sit to Stand;Stand to Sit Sit to Stand: 6: Modified independent (Device/Increase time) Stand to Sit: 6: Modified independent (Device/Increase time) Ambulation/Gait Ambulation/Gait Assistance: 6: Modified independent (Device/Increase time) Ambulation Distance (Feet): 200 Feet Assistive device: Straight cane Ambulation/Gait Assistance Details: Pt at her baseline ambulation, increased lateral sway  Gait Pattern: Wide base of support;Step-to pattern Gait velocity: slow Stairs: Yes Stairs Assistance: 5: Supervision Stairs Assistance Details (indicate cue type and reason): Supervision for safety Stair Management Technique: One rail Right;Step to pattern;Forwards Number of Stairs: 3  Modified Rankin (Stroke Patients Only) Pre-Morbid Rankin Score: Slight disability Modified Rankin: Slight disability    Exercises     PT Diagnosis:    PT Problem List:   PT Treatment  Interventions:     PT Goals Acute Rehab PT Goals PT Goal: Sit to Stand - Progress: Met PT Goal: Stand to Sit - Progress: Met PT Transfer Goal: Bed to Chair/Chair to Bed - Progress: Met PT Goal: Ambulate - Progress: Met PT Goal: Up/Down Stairs - Progress: Met  Visit Information  Last PT Received On: 07/09/12 Assistance Needed: +1    Subjective Data      Cognition  Overall Cognitive Status: Appears within functional limits for tasks assessed/performed Arousal/Alertness: Awake/alert Orientation Level: Appears intact for tasks assessed Behavior During Session: [facility] for tasks performed    Balance     End of Session PT - End of Session Equipment Utilized During Treatment: Gait belt Activity Tolerance: Patient tolerated treatment well Patient left: in chair;with call bell/phone within reach Nurse Communication: Mobility status   GP     Milana Kidney 07/09/2012, 12:56 PM

## 2012-07-09 NOTE — Progress Notes (Signed)
Pt iv dc intact. Pt under no s/s distress. Dc instructions provided to pt. Pt verbalized understanding. rx given to pt.

## 2012-07-09 NOTE — Progress Notes (Signed)
Order received, chart reviewed, noted that pt's symptoms have resolved post being treated for migraines---no longer needs OT eval. Acute OT will sign off.

## 2012-07-09 NOTE — Progress Notes (Signed)
Physical Therapy Discharge Patient Details Name: [name] MRN: [id number] DOB: [birth year] Today's Date: 07/09/2012 Time: 7829-5621 PT Time Calculation (min): 14 min  Patient discharged from PT services secondary to goals met and no further PT needs identified.  Please see latest therapy progress note for current level of functioning and progress toward goals.    Progress and discharge plan discussed with patient and/or caregiver: Patient/Caregiver agrees with plan  GP     Milana Kidney 07/09/2012, 12:56 PM

## 2012-07-09 NOTE — Progress Notes (Signed)
FMTS Attending Note Patient seen and examined by me, discussed with resident team and I agree with plan for discharge to home.  Patient's HA has resolved completely with Fioricet.  Likely migraine in nature.  Consideration for prophylaxis for migraines, given that she has these headache episodes on a nearly weekly basis.  Paula Compton, MD

## 2012-07-11 NOTE — Discharge Summary (Signed)
FMTS attending note Patient seen and examined by me on date of discharge, which was July 09, 2012 (not Dec 8, as reflected in this note).   Please see my note of 07/09/12 for more details. Paula Compton, MD

## 2012-07-15 ENCOUNTER — Encounter: Payer: Self-pay | Admitting: Family Medicine

## 2012-07-15 ENCOUNTER — Ambulatory Visit (INDEPENDENT_AMBULATORY_CARE_PROVIDER_SITE_OTHER): Payer: Medicare Other | Admitting: Family Medicine

## 2012-07-15 VITALS — BP 128/80 | HR 71 | Temp 98.2°F | Ht 62.0 in | Wt 263.5 lb

## 2012-07-15 DIAGNOSIS — R51 Headache: Secondary | ICD-10-CM

## 2012-07-15 DIAGNOSIS — G43909 Migraine, unspecified, not intractable, without status migrainosus: Secondary | ICD-10-CM

## 2012-07-15 DIAGNOSIS — G5600 Carpal tunnel syndrome, unspecified upper limb: Secondary | ICD-10-CM

## 2012-07-15 NOTE — Assessment & Plan Note (Signed)
Both wrists are bothersome, but patient is not interested in either PO steroids or injections.  She has had injections in the past.  Recommended wrist splints or wraps for the next week and Celebrex daily for the next 3-5 days.  If no improvement, patient to return to clinic and may need to proceed with nerve conduction studies.

## 2012-07-15 NOTE — Assessment & Plan Note (Signed)
Most likely migraine headache per hospital diagnosis.  Pain has not completely resolved.  Daughter requesting Neurology referral now - was not sure about it during hospitalization.  I agree with this.  Will refer to Headache Clinic.  Continue Fioricet and patient will pick up Topamax today.  Follow up with Headache Clinic or myself as needed.

## 2012-07-15 NOTE — Patient Instructions (Addendum)
Thanks for coming to see me today [name]. I will refer you to the Headache Clinic.  They will call you with time and date of appointment. Continue to take Fioricet as needed and Topamax when you receive it for migraine prevention. Schedule follow up diabetes appointment with me in 3 months. Please call or make an appointment with me if you have any questions or concerns.  Recurrent Migraine Headache A migraine headache is an intense, throbbing pain on one or both sides of your head. Recurrent migraines keep coming back. A migraine can last for 30 minutes to several hours. CAUSES  The exact cause of a migraine headache is not always known. However, a migraine may be caused when nerves in the brain become irritated and release chemicals that cause inflammation. This causes pain.  SYMPTOMS   Pain on one or both sides of your head.  Pulsating or throbbing pain.  Severe pain that prevents daily activities.  Pain that is aggravated by any physical activity.  Nausea, vomiting, or both.  Dizziness.  Pain with exposure to bright lights, loud noises, or activity.  General sensitivity to bright lights, loud noises, or smells. Before you get a migraine, you may get warning signs that a migraine is coming (aura). An aura may include:  Seeing flashing lights.  Seeing bright spots, halos, or zig-zag lines.  Having tunnel vision or blurred vision.  Having feelings of numbness or tingling.  Having trouble talking.  Having muscle weakness. MIGRAINE TRIGGERS Examples of triggers of migraine headaches include:   Alcohol.  Smoking.  Stress.  Menstruation.  Aged cheeses.  Foods or drinks that contain nitrates, glutamate, aspartame, or tyramine.  Lack of sleep.  Chocolate.  Caffeine.  Hunger.  Physical exertion.  Fatigue.  Medicines used to treat chest pain (nitroglycerine), birth control pills, estrogen, and some blood pressure medicines. DIAGNOSIS  A recurrent migraine  headache is often diagnosed based on:  Symptoms.  Physical examination.  A CT scan or MRI of your head. TREATMENT  Medicines may be given for pain and nausea. Medicines can also be given to help prevent recurrent migraines. HOME CARE INSTRUCTIONS  Only take over-the-counter or prescription medicines for pain or discomfort as directed by your caregiver. The use of long-term narcotics is not recommended.  Lie down in a dark, quiet room when you have a migraine.  Keep a journal to find out what may trigger your migraine headaches. For example, write down:  What you eat and drink.  How much sleep you get.  Any change to your diet or medicines.  Limit alcohol consumption.  Quit smoking if you smoke.  Get 7 to 9 hours of sleep, or as recommended by your caregiver.  Limit stress.  Keep lights dim if bright lights bother you and make your migraines worse. SEEK MEDICAL CARE IF:   You do not get relief from the medicines given to you.  You have a recurrence of pain. SEEK IMMEDIATE MEDICAL CARE IF:  Your migraine becomes severe.  You have a fever.  You have a stiff neck.  You have loss of vision.  You have muscular weakness or loss of muscle control.  You start losing your balance or have trouble walking.  You feel faint or pass out.  You have severe symptoms that are different from your first symptoms. MAKE SURE YOU:   Understand these instructions.  Will watch your condition.  Will get help right away if you are not doing well or get worse. Document  Released: 04/15/2001 Document Revised: 10/13/2011 Document Reviewed: 07/11/2011 [facility] Patient Information 2013 [place], [place].
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
## 2012-08-23 ENCOUNTER — Other Ambulatory Visit (HOSPITAL_COMMUNITY): Payer: Self-pay | Admitting: Family Medicine

## 2012-08-23 ENCOUNTER — Other Ambulatory Visit: Payer: Self-pay | Admitting: Family Medicine

## 2012-09-10 ENCOUNTER — Other Ambulatory Visit: Payer: Self-pay | Admitting: Family Medicine

## 2012-10-22 ENCOUNTER — Ambulatory Visit (INDEPENDENT_AMBULATORY_CARE_PROVIDER_SITE_OTHER): Payer: Medicare HMO | Admitting: Family Medicine

## 2012-10-22 ENCOUNTER — Encounter: Payer: Self-pay | Admitting: Family Medicine

## 2012-10-22 VITALS — BP 149/91 | HR 74 | Ht 62.0 in | Wt 270.0 lb

## 2012-10-22 DIAGNOSIS — E669 Obesity, unspecified: Secondary | ICD-10-CM

## 2012-10-22 DIAGNOSIS — I10 Essential (primary) hypertension: Secondary | ICD-10-CM

## 2012-10-22 DIAGNOSIS — E119 Type 2 diabetes mellitus without complications: Secondary | ICD-10-CM

## 2012-10-22 LAB — POCT GLYCOSYLATED HEMOGLOBIN (HGB A1C): Hemoglobin A1C: 10

## 2012-10-22 MED ORDER — INSULIN GLARGINE 100 UNIT/ML ~~LOC~~ SOLN: 10.0000 [IU] | Freq: Every day | SUBCUTANEOUS | Status: DC

## 2012-10-22 MED ORDER — ONETOUCH LANCETS MISC: 1.0000 | Freq: Three times a day (TID) | Status: DC

## 2012-10-22 MED ORDER — GLUCOSE BLOOD VI STRP: 1.0000 | ORAL_STRIP | Freq: Three times a day (TID) | Status: DC

## 2012-10-22 MED ORDER — ONETOUCH ULTRA 2 W/DEVICE KIT: 1.0000 | PACK | Freq: Three times a day (TID) | Status: DC

## 2012-10-22 NOTE — Patient Instructions (Addendum)
Start Lantus 10 units DAILY.   I will send kit and needles today. Check your blood sugar once in AM and after each meal. If any sugars are over > 300 or < 60, please call me. Bring log of your sugars and bring to next visit. Return to clinic in ONE month to recheck diabetes.  Diet Recommendations for Diabetes   Starchy (carb) foods include: Bread, rice, pasta, potatoes, corn, crackers, bagels, muffins, all baked goods.   Protein foods include: Meat, fish, poultry, eggs, dairy foods, and beans such as pinto and kidney beans (beans also provide carbohydrate).   1. Eat at least 3 meals and 1-2 snacks per day. Never go more than 4-5 hours while awake without eating.  2. Limit starchy foods to TWO per meal and ONE per snack. ONE portion of a starchy  food is equal to the following:   - ONE slice of bread (or its equivalent, such as half of a hamburger bun).   - 1/2 cup of a "scoopable" starchy food such as potatoes or rice.   - 1 OUNCE (28 grams) of starchy snack foods such as crackers or pretzels (look on label).   - 15 grams of carbohydrate as shown on food label.  3. Both lunch and dinner should include a protein food, a carb food, and vegetables.   - Obtain twice as many veg's as protein or carbohydrate foods for both lunch and dinner.   - Try to keep frozen veg's on hand for a quick vegetable serving.     - Fresh or frozen veg's are best.  4. Breakfast should always include protein.

## 2012-10-22 NOTE — Assessment & Plan Note (Signed)
Discussed lifestyle modifications and handout given.

## 2012-10-22 NOTE — Assessment & Plan Note (Signed)
Initially BP was 180/84, but repeat BP was 141/91.  Will continue current regimen.  Counseled patient on DASH diet and physical activity.  Return in one month.

## 2012-10-22 NOTE — Progress Notes (Signed)
  Subjective:     [name] is a 57 y.o. female who presents for follow up of diabetes.. Current symptoms include: visual disturbances.  She plans to call Dr. Nile Riggs for appointment.  Patient denies foot ulcerations, increased appetite, nausea, paresthesia of the feet, polydipsia, vomiting and weight loss. Evaluation to date has been: hemoglobin A1C. Home sugars: patient does not check sugars. Current treatments: Metformin.   Hypertension: BP elevated today due to stress.  She says takes medications everyday.  Denies abdominal pain, nausea/vomiting, fatigue, and paresthesias.  Admits to eating very salty foods and does not exercise that much due to pain.  She does try to walk short distances everyday.  Time spent counseling patient face to face: 25 minutes.  Review of Systems Per HPI   Objective:    BP 149/91  Pulse 74  Ht 5\' 2"  (1.575 m)  Wt 270 lb (122.471 kg)  BMI 49.37 kg/m2 General appearance: alert, cooperative and no distress Head: Normocephalic, without obvious abnormality, atraumatic Lungs: clear to auscultation bilaterally Heart: regular rate and rhythm, S1, S2 normal, no murmur, click, rub or gallop Abdomen: soft, non-tender; bowel sounds normal; no masses,  no organomegaly Neurologic: Grossly normal     Time spent counseling patient face to face: 25 minutes.  Assessment:   Hypertension  Diabetes 2. Out of control.  Obesity.   Plan:

## 2012-10-22 NOTE — Assessment & Plan Note (Addendum)
Hemoglobin A1c has been increasing gradually.  This time it has jumped from 7.8 to 10.  Gave patient the option of starting Lantus or adding another oral medication and work on diet and exercise.  Patient chooses to start Lantus.  Will start with Lantus 10 SQ daily.  Will send meter, kit, needles to pharmacy.  She is to keep a log and bring to visit next month.  She is to call if sugars > 300 or < 60.  Patient will also schedule eye doctor's appointment this month.   RTC one month.

## 2012-10-25 ENCOUNTER — Telehealth: Payer: Self-pay | Admitting: Family Medicine

## 2012-10-25 NOTE — Telephone Encounter (Signed)
Patient is calling to check and see if the form that was to be completed by Dr. Tye Savoy was ready for pick up.  There was nothing at the front desk.

## 2012-10-27 NOTE — Telephone Encounter (Signed)
Patient is calling to check the status of the form that she left for Dr. Tye Savoy.  The form is still in her box and the patient would like Dr. Tye Savoy to know that it is a time sensitive form and the # of days that it will take in the mail, count.  She really needs this asap.

## 2012-10-28 NOTE — Telephone Encounter (Signed)
I have the form and it will be completed by the end of the week.  Thanks.

## 2012-10-29 ENCOUNTER — Encounter (HOSPITAL_COMMUNITY): Payer: Self-pay | Admitting: *Deleted

## 2012-10-29 DIAGNOSIS — Z79899 Other long term (current) drug therapy: Secondary | ICD-10-CM | POA: Insufficient documentation

## 2012-10-29 DIAGNOSIS — I10 Essential (primary) hypertension: Secondary | ICD-10-CM | POA: Insufficient documentation

## 2012-10-29 DIAGNOSIS — Z7902 Long term (current) use of antithrombotics/antiplatelets: Secondary | ICD-10-CM | POA: Insufficient documentation

## 2012-10-29 DIAGNOSIS — E1169 Type 2 diabetes mellitus with other specified complication: Secondary | ICD-10-CM | POA: Insufficient documentation

## 2012-10-29 DIAGNOSIS — E7800 Pure hypercholesterolemia, unspecified: Secondary | ICD-10-CM | POA: Insufficient documentation

## 2012-10-29 DIAGNOSIS — Z8673 Personal history of transient ischemic attack (TIA), and cerebral infarction without residual deficits: Secondary | ICD-10-CM | POA: Insufficient documentation

## 2012-10-29 DIAGNOSIS — Z7982 Long term (current) use of aspirin: Secondary | ICD-10-CM | POA: Insufficient documentation

## 2012-10-29 DIAGNOSIS — Z794 Long term (current) use of insulin: Secondary | ICD-10-CM | POA: Insufficient documentation

## 2012-10-29 DIAGNOSIS — R51 Headache: Secondary | ICD-10-CM | POA: Insufficient documentation

## 2012-10-29 LAB — URINALYSIS, ROUTINE W REFLEX MICROSCOPIC
Bilirubin Urine: NEGATIVE
Nitrite: POSITIVE — AB
Specific Gravity, Urine: 1.031 — ABNORMAL HIGH (ref 1.005–1.030)
Urobilinogen, UA: 1 mg/dL (ref 0.0–1.0)

## 2012-10-29 LAB — COMPREHENSIVE METABOLIC PANEL
BUN: 16 mg/dL (ref 6–23)
CO2: 28 mEq/L (ref 19–32)
Calcium: 9.6 mg/dL (ref 8.4–10.5)
Creatinine, Ser: 0.77 mg/dL (ref 0.50–1.10)
GFR calc Af Amer: >90 mL/min (ref >90)
GFR calc non Af Amer: >90 mL/min (ref >90)
Glucose, Bld: 385 mg/dL — ABNORMAL HIGH (ref 70–99)
Potassium: 4.6 mEq/L (ref 3.5–5.1)

## 2012-10-29 LAB — CBC WITH DIFFERENTIAL/PLATELET
Basophils Absolute: 0 10*3/uL (ref 0.0–0.1)
Basophils Relative: 0 % (ref 0–1)
Eosinophils Absolute: 0.1 10*3/uL (ref 0.0–0.7)
HCT: 40.9 % (ref 36.0–46.0)
Hemoglobin: 13.3 g/dL (ref 12.0–15.0)
Lymphocytes Relative: 45 % (ref 12–46)
MCH: 23.1 pg — ABNORMAL LOW (ref 26.0–34.0)
MCHC: 32.5 g/dL (ref 30.0–36.0)
Monocytes Absolute: 0.5 10*3/uL (ref 0.1–1.0)
Monocytes Relative: 6 % (ref 3–12)
Neutro Abs: 3.5 10*3/uL (ref 1.7–7.7)
Platelets: 277 10*3/uL (ref 150–400)
RDW: 14.4 % (ref 11.5–15.5)

## 2012-10-29 LAB — URINE MICROSCOPIC-ADD ON

## 2012-10-29 LAB — GLUCOSE, CAPILLARY: Glucose-Capillary: 364 mg/dL — ABNORMAL HIGH (ref 70–99)

## 2012-10-29 NOTE — Telephone Encounter (Signed)
Front desk ready for pick up.

## 2012-10-29 NOTE — Telephone Encounter (Signed)
Left message on voicemail. [name]  

## 2012-10-29 NOTE — ED Notes (Signed)
Headache all day today with no n or v.   She checked her sugar and it was in the 300s.  She thinks her bp is high

## 2012-10-29 NOTE — Telephone Encounter (Signed)
Pt wants to know what time to pick up form - pls advise - states that it is time sensitive

## 2012-10-30 ENCOUNTER — Emergency Department (HOSPITAL_COMMUNITY)
Admission: EM | Admit: 2012-10-30 | Discharge: 2012-10-30 | Disposition: A | Discharge status: Home / Self Care | Payer: Medicare HMO | Attending: Emergency Medicine | Admitting: Emergency Medicine

## 2012-10-30 DIAGNOSIS — R51 Headache: Secondary | ICD-10-CM

## 2012-10-30 DIAGNOSIS — R739 Hyperglycemia, unspecified: Secondary | ICD-10-CM

## 2012-10-30 LAB — GLUCOSE, CAPILLARY: Glucose-Capillary: 230 mg/dL — ABNORMAL HIGH (ref 70–99)

## 2012-10-30 MED ORDER — SODIUM CHLORIDE 0.9 % IV BOLUS (SEPSIS)
1000.0000 mL | Freq: Once | INTRAVENOUS | Status: AC
  Administered 2012-10-30: 1000 mL via INTRAVENOUS

## 2012-10-30 MED ORDER — KETOROLAC TROMETHAMINE 30 MG/ML IJ SOLN
30.0000 mg | Freq: Once | INTRAMUSCULAR | Status: AC
  Administered 2012-10-30: 30 mg via INTRAVENOUS
  Filled 2012-10-30: qty 1

## 2012-10-30 NOTE — ED Provider Notes (Signed)
History     CSN: [id number]  Arrival date & time 10/29/12  2221   First MD Initiated Contact with Patient 10/30/12 0109      Chief Complaint  Patient presents with  . Headache    (Consider location/radiation/quality/duration/timing/severity/associated sxs/prior treatment) HPI History provided by pt.   Pt checked BG at home this evening and it was elevated in 300s.  She was recently prescribed lantus to supplement her metformin, picked up pens at pharmacy today, and the needles were not included.  She can not afford them until next week.  Pt reports that BG is typically well controlled, but per prior chart, A1C 10.0 on 10/22/12.  Has had polyuria/polydipsia.  No recent illnesses.  Since arriving in ED, she has developed a left frontal headache.  No associated fever, acute vision changes, dizziness, N/V, extremity weakness/paresthesias.  Otherwise feeling well.  Past Medical History  Diagnosis Date  . Hypertension   . Hypertension     Hypertensive urgency 05/2006  . Atrial fibrillation   . Hypercholesteremia   . 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 Diabetes mellitus     type 2    Past Surgical History  Procedure Laterality Date  . Tonsilectomy, adenoidectomy, bilateral myringotomy and tubes  age 50  . Dilation and curettage of uterus  1976  . Meniscus repair  03/09    right knee  . Tonsillectomy      Family History  Problem Relation Age of Onset  . Heart failure Mother   . Diabetes Father   . Hypertension Father   . Lung cancer Sister     History  Substance Use Topics  . Smoking status: Never Smoker   . Smokeless tobacco: Not on file  . Alcohol Use: No    OB History   Grav Para Term Preterm Abortions TAB SAB Ect Mult Living                  Review of Systems  All other systems reviewed and are negative.    Allergies  Propoxyphene-acetaminophen  Home Medications   Current Outpatient Rx  Name  Route  Sig  Dispense   Refill  . amLODipine (NORVASC) 10 MG tablet   Oral   Take 10 mg by mouth daily.         Marland Kitchen aspirin EC 81 MG tablet   Oral   Take 81 mg by mouth daily.           . celecoxib (CELEBREX) 200 MG capsule   Oral   Take 200 mg by mouth daily.           . citalopram (CELEXA) 20 MG tablet      Take 1 tablet (20 mg total) by mouth daily.   31 tablet   2     Refill [id number]   . cloNIDine (CATAPRES) 0.2 MG tablet   Oral   Take 0.2 mg by mouth 2 (two) times daily.         . clopidogrel (PLAVIX) 75 MG tablet   Oral   Take 75 mg by mouth every morning.           . gabapentin (NEURONTIN) 300 MG capsule   Oral   Take 300 mg by mouth at bedtime.         Marland Kitchen lisinopril (PRINIVIL,ZESTRIL) 40 MG tablet   Oral   Take 1 tablet (40 mg total) by mouth daily.   30  tablet   6   . metFORMIN (GLUCOPHAGE) 1000 MG tablet      Take 1 tablet by mouth 2 times daily.   60 tablet   6     Refill [id number]   . metoprolol-hydrochlorothiazide (LOPRESSOR HCT) 100-25 MG per tablet   Oral   Take 1 tablet by mouth daily.         . nitroGLYCERIN (NITROSTAT) 0.4 MG SL tablet   Sublingual   Place 0.4 mg under the tongue every 5 (five) minutes as needed. For chest pain          . omeprazole (PRILOSEC) 20 MG capsule   Oral   Take 20 mg by mouth at bedtime.           . rosuvastatin (CRESTOR) 10 MG tablet   Oral   Take 10 mg by mouth at bedtime.          . Blood Glucose Monitoring Suppl (ONE TOUCH ULTRA 2) W/DEVICE KIT   Does not apply   1 kit by Does not apply route 4 (four) times daily - after meals and at bedtime.   1 each   3   . glucose blood test strip   Other   1 each by Other route 4 (four) times daily - after meals and at bedtime. Use as instructed   100 each   12   . insulin glargine (LANTUS SOLOSTAR) 100 UNIT/ML injection   Subcutaneous   Inject 0.1 mLs (10 Units total) into the skin daily.   3 mL   4   . ONE TOUCH LANCETS MISC   Does not apply   1 each by  Does not apply route 4 (four) times daily - after meals and at bedtime.   200 each   0     BP 183/88  Pulse 88  Temp(Src) 98.1 F (36.7 C)  SpO2 98%  Physical Exam  Nursing note and vitals reviewed. Constitutional: She is oriented to person, place, and time. She appears well-developed and well-nourished. No distress.  obese  HENT:  Head: Normocephalic and atraumatic.  Mouth/Throat: Oropharynx is clear and moist.  Eyes:  Normal appearance  Neck: Normal range of motion.  Cardiovascular: Normal rate, regular rhythm and intact distal pulses.   Pulmonary/Chest: Effort normal and breath sounds normal.  Musculoskeletal: Normal range of motion.  Neurological: She is alert and oriented to person, place, and time. No sensory deficit. Coordination normal.  CN 3-12 intact.  No nystagmus. 5/5 and equal upper and lower extremity strength.  No past pointing.     Skin: Skin is warm and dry. No rash noted.  Psychiatric: She has a normal mood and affect. Her behavior is normal.    ED Course  Procedures (including critical care time)  Labs Reviewed  CBC WITH DIFFERENTIAL - Abnormal; Notable for the following:    RBC 5.75 (*)    MCV 71.1 (*)    MCH 23.1 (*)    All other components within normal limits  COMPREHENSIVE METABOLIC PANEL - Abnormal; Notable for the following:    Glucose, Bld 385 (*)    Alkaline Phosphatase 139 (*)    Total Bilirubin 0.2 (*)    All other components within normal limits  URINALYSIS, ROUTINE W REFLEX MICROSCOPIC - Abnormal; Notable for the following:    APPearance CLOUDY (*)    Specific Gravity, Urine 1.031 (*)    Glucose, UA >1000 (*)    Hgb urine dipstick TRACE (*)  Nitrite POSITIVE (*)    Leukocytes, UA SMALL (*)    All other components within normal limits  GLUCOSE, CAPILLARY - Abnormal; Notable for the following:    Glucose-Capillary 364 (*)    All other components within normal limits  URINE MICROSCOPIC-ADD ON - Abnormal; Notable for the following:     Squamous Epithelial / LPF FEW (*)    Bacteria, UA FEW (*)    All other components within normal limits  URINE CULTURE   No results found.   1. Hyperglycemia   2. Headache       MDM  57yo F presents w/ hyperglycemia.  Non-compliant w/ newly prescribed lantus because the needles were not supplied to her.  No recent illnesses.  A1C 10.0 on 10/22/12.  Cbg 346.  No acidosis.  C/o headache since arriving in ED.  Will treat w/ IVF and toradol.  1:51 AM   Headache resolved and BG improved.  Pt prescribed the needles to her lantus pen.  I advised compliance with her diabetes medications and return to ER for worsening headache.  3:32 AM         Otilio Miu, PA-C 10/30/12 [phone]

## 2012-10-30 NOTE — ED Provider Notes (Signed)
Medical screening examination/treatment/procedure(s) were performed by non-physician practitioner and as supervising physician I was immediately available for consultation/collaboration.  Sarya Linenberger M Della Scrivener, MD 10/30/12 0340 

## 2012-10-30 NOTE — ED Notes (Signed)
EDPA in to see pt 

## 2012-10-30 NOTE — ED Notes (Signed)
Pt dc to home.  Pt states understanding to dc paperwork.  Pt taken to exit via w/c.

## 2012-10-31 LAB — URINE CULTURE

## 2012-11-03 ENCOUNTER — Telehealth: Payer: Self-pay | Admitting: Family Medicine

## 2012-11-03 NOTE — Telephone Encounter (Signed)
Please tell patient to increase Lantus to 20 units once daily, avoid carbohydrates, and then schedule follow up appointment with me next week.  Thanks Beverly.

## 2012-11-03 NOTE — Telephone Encounter (Signed)
Will route to Dr. Tye Savoy for advice and call patient back.  Gaylene Brooks, RN

## 2012-11-03 NOTE — Telephone Encounter (Signed)
Patient informed to increase Lantus to 20 mg daily and avoid carbs.  Appt scheduled with Dr. Tye Savoy for 11/12/12 at 9:00 am.  Gaylene Brooks, RN

## 2012-11-03 NOTE — Telephone Encounter (Signed)
BS before lunch 441 Took lantus and ate and is now 411 Running over 300 since last visit

## 2012-11-12 ENCOUNTER — Ambulatory Visit (INDEPENDENT_AMBULATORY_CARE_PROVIDER_SITE_OTHER): Payer: Medicare HMO | Admitting: Family Medicine

## 2012-11-12 ENCOUNTER — Telehealth: Payer: Self-pay | Admitting: *Deleted

## 2012-11-12 ENCOUNTER — Encounter: Payer: Self-pay | Admitting: Family Medicine

## 2012-11-12 VITALS — BP 132/79 | HR 70 | Temp 98.3°F | Ht 62.0 in | Wt 264.0 lb

## 2012-11-12 DIAGNOSIS — E1165 Type 2 diabetes mellitus with hyperglycemia: Secondary | ICD-10-CM

## 2012-11-12 DIAGNOSIS — IMO0001 Reserved for inherently not codable concepts without codable children: Secondary | ICD-10-CM

## 2012-11-12 MED ORDER — INSULIN ASPART 100 UNIT/ML ~~LOC~~ SOLN: 5.0000 [IU] | Freq: Three times a day (TID) | SUBCUTANEOUS | Status: DC

## 2012-11-12 NOTE — Patient Instructions (Addendum)
1. Increase Lantus to 40 units at bedtime. 2. Start Novolog 5 units with breakfast, lunch, and dinner. 3. Call your doctor if you sugars are greater than 350 or less than 70. 4. Schedule an appointment with Dr. Raymondo Band in Pharmacy Clinic in 1-2 weeks.  Diet Recommendations for Diabetes   Starchy (carb) foods include: Bread, rice, pasta, potatoes, corn, crackers, bagels, muffins, all baked goods.   Protein foods include: Meat, fish, poultry, eggs, dairy foods, and beans such as pinto and kidney beans (beans also provide carbohydrate).   1. Eat at least 3 meals and 1-2 snacks per day. Never go more than 4-5 hours while awake without eating.  2. Limit starchy foods to TWO per meal and ONE per snack. ONE portion of a starchy  food is equal to the following:   - ONE slice of bread (or its equivalent, such as half of a hamburger bun).   - 1/2 cup of a "scoopable" starchy food such as potatoes or rice.   - 1 OUNCE (28 grams) of starchy snack foods such as crackers or pretzels (look on label).   - 15 grams of carbohydrate as shown on food label.  3. Both lunch and dinner should include a protein food, a carb food, and vegetables.   - Obtain twice as many veg's as protein or carbohydrate foods for both lunch and dinner.   - Try to keep frozen veg's on hand for a quick vegetable serving.     - Fresh or frozen veg's are best.  4. Breakfast should always include protein.

## 2012-11-12 NOTE — Assessment & Plan Note (Signed)
CBG still out of control.  Will increase Lantus to 40 units daily and start Novolog 5 units with breakfast, lunch, and dinner.  Advised patient to schedule appointment with Dr. Raymondo Band in 1-2 weeks for co-management of poorly controlled diabetes.  Patient to call me if CBG > 350 or < 70 or if she becomes symptomatic.

## 2012-11-12 NOTE — Telephone Encounter (Signed)
Received fax from [facility] prior authorization for Novolog 100 units/mL #30 day supply.  Contacted [employer] at [phone] and they will fax form to our office.  Form placed in Dr. Sherron Flemings Cruz's box for completion.  Gaylene Brooks, RN

## 2012-11-12 NOTE — Progress Notes (Signed)
  Subjective:     [name] is a 57 y.o. female who presents for follow up of diabetes.    Current symptoms include: hyperglycemia, nausea and visual disturbances. Patient denies foot ulcerations, hypoglycemia  and vomiting. Evaluation to date has been: hemoglobin A1C. Home sugars: she checks CBG fasting AM and after breakfast, lunch, dinner.  CBG 346 fasting this AM, CBG range 290 - 400s.  Current treatments: Metformin 1000 BID and Lantus 30 units at night.  The following portions of the patient's history were reviewed and updated as appropriate: allergies, current medications, past medical history and problem list.  Review of Systems Per HPI   Objective:    BP 132/79  Pulse 70  Temp(Src) 98.3 F (36.8 C) (Oral)  Ht 5\' 2"  (1.575 m)  Wt 264 lb (119.75 kg)  BMI 48.27 kg/m2 General appearance: alert, cooperative and no distress Lungs: effort normal Heart: normal rate Skin: no rash   Assessment:    Diabetes mellitus Type II, under poor control.    Plan:

## 2012-11-15 NOTE — Telephone Encounter (Signed)
Prior auth completed and placed in to be faxed bin.

## 2012-11-26 ENCOUNTER — Encounter: Payer: Self-pay | Admitting: Family Medicine

## 2012-11-26 ENCOUNTER — Other Ambulatory Visit: Payer: Self-pay | Admitting: *Deleted

## 2012-11-26 ENCOUNTER — Ambulatory Visit (INDEPENDENT_AMBULATORY_CARE_PROVIDER_SITE_OTHER): Payer: Medicare HMO | Admitting: Family Medicine

## 2012-11-26 VITALS — BP 150/68 | HR 74 | Temp 97.8°F | Ht 62.0 in | Wt 217.0 lb

## 2012-11-26 DIAGNOSIS — IMO0001 Reserved for inherently not codable concepts without codable children: Secondary | ICD-10-CM

## 2012-11-26 DIAGNOSIS — I10 Essential (primary) hypertension: Secondary | ICD-10-CM

## 2012-11-26 DIAGNOSIS — E1165 Type 2 diabetes mellitus with hyperglycemia: Secondary | ICD-10-CM

## 2012-11-26 DIAGNOSIS — E119 Type 2 diabetes mellitus without complications: Secondary | ICD-10-CM

## 2012-11-26 MED ORDER — CLONIDINE HCL 0.2 MG PO TABS: 0.2000 mg | ORAL_TABLET | Freq: Two times a day (BID) | ORAL | Status: DC

## 2012-11-26 MED ORDER — INSULIN GLARGINE 100 UNIT/ML ~~LOC~~ SOLN: 45.0000 [IU] | Freq: Every day | SUBCUTANEOUS | Status: DC

## 2012-11-26 MED ORDER — OMEPRAZOLE 20 MG PO CPDR: 20.0000 mg | DELAYED_RELEASE_CAPSULE | Freq: Every day | ORAL | Status: DC

## 2012-11-26 MED ORDER — LISINOPRIL 40 MG PO TABS: 40.0000 mg | ORAL_TABLET | Freq: Every day | ORAL | Status: DC

## 2012-11-26 MED ORDER — NYSTATIN 100000 UNIT/GM EX POWD: Freq: Three times a day (TID) | CUTANEOUS | Status: DC

## 2012-11-26 MED ORDER — AMLODIPINE BESYLATE 10 MG PO TABS: 10.0000 mg | ORAL_TABLET | Freq: Every day | ORAL | Status: DC

## 2012-11-26 MED ORDER — INSULIN ASPART 100 UNIT/ML ~~LOC~~ SOLN: 10.0000 [IU] | Freq: Three times a day (TID) | SUBCUTANEOUS | Status: DC

## 2012-11-26 NOTE — Progress Notes (Signed)
  Subjective:     [name] is a 57 y.o. female who presents for follow up of diabetes.  Current symptoms include: hyperglycemia and excessive sweating when CBG > 300. Patient denies foot ulcerations, paresthesia of the feet, polydipsia, polyuria, visual disturbances and vomiting. Evaluation to date has been: hemoglobin A1C. Home sugars: BGs have been labile ranging between 188 and 492. Current treatments: Lantus 40 units once per day, Novolog 5 units TIDWC, Metformin 1000 BID.   Last eye exam: 2011.  Hypertension: BP elevated today at 150/68.  Checks BP once a week at home which is elevated.  Denies any HA, paresthesias, weakness, changes in speech.  Endorses fatigue.  Patient does not have medications with her and what she tells me she is taking does not match up to medical records.  She says she takes 4 BP medications.  Review of Systems Per HPI   Objective:    BP 150/68  Pulse 74  Temp(Src) 97.8 F (36.6 C) (Oral)  Ht 5\' 2"  (1.575 m)  Wt 217 lb (98.431 kg)  BMI 39.68 kg/m2 General appearance: alert, cooperative and no distress Throat: lips, mucosa, and tongue normal; teeth and gums normal Lungs: clear to auscultation bilaterally Heart: regular rate and rhythm, S1, S2 normal, no murmur, click, rub or gallop Extremities: extremities normal, atraumatic, no cyanosis or edema; normal sensation bilateral feet; no open wounds or ulcers   Assessment:    Diabetes mellitus Type II, under poor control.   Hypertension.   Plan:

## 2012-11-26 NOTE — Assessment & Plan Note (Signed)
BP not at goal.  She did not bring medications with her and when I reviewed med rec, medications do not match up.  She has an appointment with Pharmacy clinic next week.  Encouraged patient to bring medications to all appointments.  Will not add new medications until she can tell me what she takes accurately.  Could increase Metoprolol or Catapres if BP still high at next visit.

## 2012-11-26 NOTE — Assessment & Plan Note (Addendum)
CBG not at goal despite starting Novolog.  Patient does not seem to know what medications she takes and I worry about using a SSI with her.  Will increase Lantus 45 units once a day.  Increase Novolog 10 units with breakfast, lunch, and dinner.  Patient to call me if sugars < 60 or > 350.  She has an appointment with Dr. Raymondo Band on 12/03/2012.  Schedule follow up appointment with me in 3-4 weeks.

## 2012-11-26 NOTE — Patient Instructions (Addendum)
Bring all the medications that you take to your appointment with Dr. Raymondo Band on May 2nd. Increase Lantus 45 units once a day. Increase Novolog 10 units with breakfast, lunch, and dinner. Continue to check sugars four times per day.  Call your doctor if sugars < 60 or > 350. Schedule follow up appointment with me in 3-4 weeks.  For blood pressure, bring all medications to your appointments so I know what you are taking. Try to avoid salt in your diet and work on increasing physical activity.

## 2012-11-29 ENCOUNTER — Other Ambulatory Visit: Payer: Self-pay | Admitting: *Deleted

## 2012-11-29 MED ORDER — ROSUVASTATIN CALCIUM 10 MG PO TABS: 10.0000 mg | ORAL_TABLET | Freq: Every day | ORAL | Status: DC

## 2012-12-03 ENCOUNTER — Telehealth: Payer: Self-pay | Admitting: *Deleted

## 2012-12-03 ENCOUNTER — Ambulatory Visit (INDEPENDENT_AMBULATORY_CARE_PROVIDER_SITE_OTHER): Payer: Medicare HMO | Admitting: Pharmacist

## 2012-12-03 ENCOUNTER — Encounter: Payer: Self-pay | Admitting: Pharmacist

## 2012-12-03 VITALS — BP 164/98 | HR 77 | Ht 62.5 in | Wt 276.4 lb

## 2012-12-03 DIAGNOSIS — E119 Type 2 diabetes mellitus without complications: Secondary | ICD-10-CM

## 2012-12-03 DIAGNOSIS — I10 Essential (primary) hypertension: Secondary | ICD-10-CM

## 2012-12-03 DIAGNOSIS — IMO0001 Reserved for inherently not codable concepts without codable children: Secondary | ICD-10-CM

## 2012-12-03 DIAGNOSIS — E1165 Type 2 diabetes mellitus with hyperglycemia: Secondary | ICD-10-CM

## 2012-12-03 MED ORDER — INSULIN GLARGINE 100 UNIT/ML ~~LOC~~ SOLN: 50.0000 [IU] | Freq: Every day | SUBCUTANEOUS | Status: DC

## 2012-12-03 MED ORDER — INSULIN ASPART 100 UNIT/ML ~~LOC~~ SOLN: 15.0000 [IU] | Freq: Three times a day (TID) | SUBCUTANEOUS | Status: DC

## 2012-12-03 MED ORDER — INSULIN PEN NEEDLE 31G X 8 MM MISC: 1.0000 | Freq: Four times a day (QID) | Status: DC

## 2012-12-03 NOTE — Progress Notes (Signed)
Patient ID: [name], female   DOB: 05-30-1956, 57 y.o.   MRN: [id number] Reviewed: Agree with Dr. Macky Lower documentation and management.

## 2012-12-03 NOTE — Assessment & Plan Note (Signed)
BP remains elevated despite multidrug regimen in a patient who appears adherent with prescribed Tx.   Pain may be a significant factor in BP elevation.   Consider change of BB to Carvedilol adding back thiazide or increasing dose of clonidine as next step.

## 2012-12-03 NOTE — Telephone Encounter (Signed)
Returned pharmacy call verifying Novolog pen for pt use. No further questions. Dayla Gasca, Harold Hedge, RN

## 2012-12-03 NOTE — Progress Notes (Signed)
  Subjective:    Patient ID: [name], female    DOB: 05-04-1956, 57 y.o.   MRN: [id number]  HPI Patient arrives in somewhat depressed spirits, walking with assistance of cane.   Patient reports being under "STRESS" with her father being dx with lung and bone cancer AND her son recently being involved in a traffic accident (1 month ago - 4 staples in his head).   She states she has been much less active since January 1st likely related to her headaches and back pain.   She admits to walking much less due to the back pain.    Review of Systems     Objective:   Physical Exam  Dwonload of blood glucose meter finds: Patient is testing 3 times daily (89 tests downloaded) REadings are consistently > 250 and < 400 AVG CBG = 305 Lowest reading was 132.        Assessment & Plan:   Diabetes longstanding currently under poor control of blood glucose based on   Lab Results  Component Value Date   HGBA1C 10.0 10/22/2012   CBG readings 250-400.   Control is suboptimal due to diet, lack of exercise, and stress. Denies hypoglycemic events.  Able to verbalize appropriate hypoglycemia management plan. Increased dose of basal insulin Lantus (insulin glargine) to 50 units QPM. Increased dose of rapid insulin Novolog (insulin aspart) to 15/20/15 units with three meals during the day.  Written patient instructions provided. Stress appears to be significant - pain management may be very important for her to attempt more exercise in the future.      Follow up in  Pharmacist Clinic Visit in early June (next visit with Dr. Tye Savoy at the end of this month.    Total time in face to face counseling 45 minutes.    BP remains elevated despite multidrug regimen in a patient who appears adherent with prescribed Tx.   Pain may be a significant factor in BP elevation.

## 2012-12-03 NOTE — Assessment & Plan Note (Signed)
Diabetes longstanding currently under poor control of blood glucose based on   Lab Results  Component Value Date   HGBA1C 10.0 10/22/2012   CBG readings 250-400.   Control is suboptimal due to diet, lack of exercise, and stress. Denies hypoglycemic events.  Able to verbalize appropriate hypoglycemia management plan. Increased dose of basal insulin Lantus (insulin glargine) to 50 units QPM. Increased dose of rapid insulin Novolog (insulin aspart) to 15/20/15 units with three meals during the day.  Written patient instructions provided. Stress appears to be significant - pain management may be very important for her to attempt more exercise in the future.      Follow up in  Pharmacist Clinic Visit in early June (next visit with Dr. Tye Savoy at the end of this month.    Total time in face to face counseling 45 minutes.    BP remains elevated despite multidrug regimen in a patient who appears adherent with prescribed Tx.   Pain may be a significant factor in BP elevation.

## 2012-12-03 NOTE — Patient Instructions (Addendum)
Increase Lantus 50 units at night.   Increase Novolog 15 unit with breakfast,  20 units with lunch,  15 units with dinner.   Next visit with Dr. Tye Savoy - 3 weeks  Return Rx Clinic in 4-5 weeks (early June)

## 2012-12-06 ENCOUNTER — Other Ambulatory Visit: Payer: Self-pay | Admitting: *Deleted

## 2012-12-06 MED ORDER — CELECOXIB 200 MG PO CAPS: 200.0000 mg | ORAL_CAPSULE | Freq: Every day | ORAL | Status: DC

## 2012-12-28 ENCOUNTER — Encounter: Payer: Self-pay | Admitting: Family Medicine

## 2012-12-28 ENCOUNTER — Ambulatory Visit (INDEPENDENT_AMBULATORY_CARE_PROVIDER_SITE_OTHER): Payer: Medicare HMO | Admitting: Family Medicine

## 2012-12-28 VITALS — BP 132/61 | HR 76 | Temp 99.1°F | Ht 62.0 in | Wt 282.0 lb

## 2012-12-28 DIAGNOSIS — IMO0001 Reserved for inherently not codable concepts without codable children: Secondary | ICD-10-CM

## 2012-12-28 DIAGNOSIS — I10 Essential (primary) hypertension: Secondary | ICD-10-CM

## 2012-12-28 DIAGNOSIS — E1165 Type 2 diabetes mellitus with hyperglycemia: Secondary | ICD-10-CM

## 2012-12-28 DIAGNOSIS — E119 Type 2 diabetes mellitus without complications: Secondary | ICD-10-CM

## 2012-12-28 DIAGNOSIS — B351 Tinea unguium: Secondary | ICD-10-CM

## 2012-12-28 LAB — POCT GLYCOSYLATED HEMOGLOBIN (HGB A1C): Hemoglobin A1C: 9.8

## 2012-12-28 NOTE — Progress Notes (Signed)
  Subjective:     [name] is a 57 y.o. female who presents for follow up of diabetes.  Current symptoms include: hyperglycemia and increase appetite. Patient denies foot ulcerations, hypoglycemia  and paresthesia of the feet. Evaluation to date has been: fasting blood sugar and hemoglobin A1C. Home sugars: BGs range between 210 and 320. Current treatments:  Lantus 50 daily and Novolog 15/20/15 and Metformin 1000 BID.  Patient says she eats sugar free foods and uses Splenda.  She admits to eating a lot of carbohydrates (bread, potatoes, fries).  She does not exercise much due to pain in knees.  HTN: BP at goal today.  She is also followed by Dr. Raymondo Band and Pharmacy clinic.  She has been compliant with medications and trying to eat a low Na diet.  Patient denies any HA, blurry vision, CP, SOB, but does endorse fatigue.  Patient concerned about left great toenail discoloration.  She says this has been going on intermittently for about one year.  She says it came back in January and has been getting worse.  Patient willing to try topical treatment for several weeks.  Denies any toe pain, swelling, or erythema.  Review of Systems Per HPI   Objective:    BP 132/61  Pulse 76  Temp(Src) 99.1 F (37.3 C) (Oral)  Ht 5\' 2"  (1.575 m)  Wt 282 lb (127.914 kg)  BMI 51.57 kg/m2 General appearance: alert, cooperative and no distress Lungs: clear to auscultation bilaterally Heart: regular rate and rhythm, S1, S2 normal, no murmur, click, rub or gallop Abdomen: soft, ND Extremities: extremities normal, atraumatic, no cyanosis or edema Skin: left great toenail is discolored and rigid, no swelling or ingrown toenail     Assessment:    Diabetes mellitus Type II, under poor control.  Hypertension Toenail fungus, left  Plan:

## 2012-12-28 NOTE — Patient Instructions (Addendum)
We will check you diabetes level today. Continue current medications and remember to follow up with Dr. Raymondo Band this week. Your blood pressure has improved.  Keep up the good work. Goals for today: increase vegetables in your diet and eat smaller portions of potatoes, rice, bread, other starches. Schedule follow up appointment with me in one month or sooner as needed.  Diet Recommendations for Diabetes   Starchy (carb) foods include: Bread, rice, pasta, potatoes, corn, crackers, bagels, muffins, all baked goods.   Protein foods include: Meat, fish, poultry, eggs, dairy foods, and beans such as pinto and kidney beans (beans also provide carbohydrate).   1. Eat at least 3 meals and 1-2 snacks per day. Never go more than 4-5 hours while awake without eating.  2. Limit starchy foods to TWO per meal and ONE per snack. ONE portion of a starchy  food is equal to the following:   - ONE slice of bread (or its equivalent, such as half of a hamburger bun).   - 1/2 cup of a "scoopable" starchy food such as potatoes or rice.   - 1 OUNCE (28 grams) of starchy snack foods such as crackers or pretzels (look on label).   - 15 grams of carbohydrate as shown on food label.  3. Both lunch and dinner should include a protein food, a carb food, and vegetables.   - Obtain twice as many veg's as protein or carbohydrate foods for both lunch and dinner.   - Try to keep frozen veg's on hand for a quick vegetable serving.     - Fresh or frozen veg's are best.  4. Breakfast should always include protein.

## 2012-12-30 ENCOUNTER — Encounter: Payer: Self-pay | Admitting: Family Medicine

## 2012-12-30 DIAGNOSIS — B351 Tinea unguium: Secondary | ICD-10-CM | POA: Insufficient documentation

## 2012-12-30 MED ORDER — TERBINAFINE HCL 1 % EX CREA: TOPICAL_CREAM | Freq: Two times a day (BID) | CUTANEOUS | Status: DC

## 2012-12-30 NOTE — Assessment & Plan Note (Signed)
BP at goal today, likely due to medical compliance and low Na diet.  Discussed importance of weight loss.  Continue current regimen.

## 2012-12-30 NOTE — Assessment & Plan Note (Signed)
Will try Lamisil topical for 4 weeks to see if this makes a difference.  If not, will discuss oral anti-fungal regimen.

## 2012-12-30 NOTE — Assessment & Plan Note (Addendum)
A1c 9.8 today down from 10.0.  Patient has an appointment with Dr. Raymondo Band this week.  May consider increasing Lantus to 60 units daily and continue Novolog regimen.  Appreciate Dr. Macky Lower recommendations. Congratulated patient for working on eating a healthier diet.  Follow up with me in 4 weeks.

## 2012-12-31 ENCOUNTER — Encounter: Payer: Self-pay | Admitting: Pharmacist

## 2012-12-31 ENCOUNTER — Ambulatory Visit (INDEPENDENT_AMBULATORY_CARE_PROVIDER_SITE_OTHER): Payer: Medicare HMO | Admitting: Pharmacist

## 2012-12-31 VITALS — BP 103/76 | HR 69 | Ht 62.0 in | Wt 280.0 lb

## 2012-12-31 DIAGNOSIS — E119 Type 2 diabetes mellitus without complications: Secondary | ICD-10-CM

## 2012-12-31 DIAGNOSIS — F3289 Other specified depressive episodes: Secondary | ICD-10-CM

## 2012-12-31 DIAGNOSIS — E1165 Type 2 diabetes mellitus with hyperglycemia: Secondary | ICD-10-CM

## 2012-12-31 DIAGNOSIS — F329 Major depressive disorder, single episode, unspecified: Secondary | ICD-10-CM

## 2012-12-31 DIAGNOSIS — IMO0002 Reserved for concepts with insufficient information to code with codable children: Secondary | ICD-10-CM

## 2012-12-31 DIAGNOSIS — IMO0001 Reserved for inherently not codable concepts without codable children: Secondary | ICD-10-CM

## 2012-12-31 MED ORDER — CITALOPRAM HYDROBROMIDE 20 MG PO TABS: 20.0000 mg | ORAL_TABLET | Freq: Every day | ORAL | Status: DC

## 2012-12-31 MED ORDER — INSULIN GLARGINE 100 UNIT/ML ~~LOC~~ SOLN: 60.0000 [IU] | Freq: Every day | SUBCUTANEOUS | Status: DC

## 2012-12-31 MED ORDER — INSULIN ASPART 100 UNIT/ML ~~LOC~~ SOLN: 20.0000 [IU] | Freq: Three times a day (TID) | SUBCUTANEOUS | Status: DC

## 2012-12-31 NOTE — Progress Notes (Signed)
  Subjective:    Patient ID: [name], female    DOB: 06-04-1956, 57 y.o.   MRN: [id number]  HPI 57 year old female referred to RX clinic for DM management. Patient walks in with a cane and appears depressed at initial encounter.   Review of Systems     Objective:   Physical Exam        Assessment & Plan:  DM- HgA1c elevated at 9.8 on 12/28/12. Patient endorses not checking blood glucose x 2 weeks due to insufficient amount of strips. She is supplied 100 strips/month and checks her glucose 4x/day. She endorses having readings in the 200s when she was able to check her glucose. Novolog increased to 20 units TID with meals and Lantus increased to 60 units at bedtime. Samples were provided. Advised patient that she may decrease her blood glucose checks to twice daily in order to have sufficient amount of strips each month.   Depression- Patient endorses not taking citalopram because she was out of refills. Re-ordered medication.  Patient seen with Franchot Erichsen , PharmD Resident and Richrd Humbles, PharmD candidate.

## 2012-12-31 NOTE — Assessment & Plan Note (Signed)
Depression- Patient endorses not taking citalopram because she was out of refills. Re-ordered medication.

## 2012-12-31 NOTE — Assessment & Plan Note (Signed)
DM- HgA1c elevated at 9.8 on 12/28/12. Patient endorses not checking blood glucose x 2 weeks due to insufficient amount of strips. She is supplied 100 strips/month and checks her glucose 4x/day. She endorses having readings in the 200s when she was able to check her glucose. Novolog increased to 20 units TID with meals and Lantus increased to 60 units at bedtime. Samples were provided. Advised patient that she may decrease her blood glucose checks to twice daily in order to have sufficient amount of strips each month.

## 2012-12-31 NOTE — Patient Instructions (Addendum)
Thank you for coming to see [name] today!  Continue taking your citalopram (Celexa) 20mg --1 tablet daily Increase your Novolog insulin to 20 units prior to breakfast, lunch and dinner.  Increase your Lantus insulin to 60 units at bedtime.  You may decrease your blood sugar checks to twice daily   Please follow up with Dr. Tye Savoy in 2 weeks Please follow up with Dr. Raymondo Band in Pharmacy Clinic at the beginning of July

## 2013-01-03 ENCOUNTER — Other Ambulatory Visit: Payer: Self-pay | Admitting: *Deleted

## 2013-01-03 MED ORDER — METOPROLOL-HYDROCHLOROTHIAZIDE 100-25 MG PO TABS: 1.0000 | ORAL_TABLET | Freq: Every day | ORAL | Status: DC

## 2013-01-03 MED ORDER — CELECOXIB 200 MG PO CAPS: 200.0000 mg | ORAL_CAPSULE | Freq: Every day | ORAL | Status: DC

## 2013-01-03 MED ORDER — ROSUVASTATIN CALCIUM 10 MG PO TABS: 10.0000 mg | ORAL_TABLET | Freq: Every day | ORAL | Status: DC

## 2013-01-03 NOTE — Progress Notes (Signed)
Patient ID: [name], female   DOB: 10/18/1955, 57 y.o.   MRN: [id number] Reviewed: Agree with Dr. Koval's documentation and management. 

## 2013-01-03 NOTE — Telephone Encounter (Signed)
Requested Prescriptions   Pending Prescriptions Disp Refills  . celecoxib (CELEBREX) 200 MG capsule 30 capsule 0    Sig: Take 1 capsule (200 mg total) by mouth daily.    Refill form placed in box. Wyatt Haste, RN-BSN

## 2013-01-24 ENCOUNTER — Other Ambulatory Visit: Payer: Self-pay | Admitting: *Deleted

## 2013-01-24 ENCOUNTER — Ambulatory Visit: Payer: Medicare HMO | Admitting: Family Medicine

## 2013-01-31 ENCOUNTER — Telehealth: Payer: Self-pay | Admitting: *Deleted

## 2013-01-31 MED ORDER — TOPIRAMATE 50 MG PO TABS: 50.0000 mg | ORAL_TABLET | Freq: Every day | ORAL | Status: DC | PRN

## 2013-01-31 NOTE — Telephone Encounter (Signed)
Requesting topiramate 50 mg refill - not on MAR - please advise Wyatt Haste, RN-BSN

## 2013-02-03 ENCOUNTER — Other Ambulatory Visit: Payer: Self-pay | Admitting: *Deleted

## 2013-02-03 MED ORDER — CELECOXIB 200 MG PO CAPS: 200.0000 mg | ORAL_CAPSULE | Freq: Every day | ORAL | Status: DC

## 2013-02-07 ENCOUNTER — Ambulatory Visit (INDEPENDENT_AMBULATORY_CARE_PROVIDER_SITE_OTHER): Payer: Medicare HMO | Admitting: Pharmacist

## 2013-02-07 ENCOUNTER — Encounter: Payer: Self-pay | Admitting: Pharmacist

## 2013-02-07 VITALS — BP 130/80 | HR 88 | Ht 62.0 in | Wt 284.5 lb

## 2013-02-07 DIAGNOSIS — E119 Type 2 diabetes mellitus without complications: Secondary | ICD-10-CM

## 2013-02-07 DIAGNOSIS — E1165 Type 2 diabetes mellitus with hyperglycemia: Secondary | ICD-10-CM

## 2013-02-07 DIAGNOSIS — IMO0001 Reserved for inherently not codable concepts without codable children: Secondary | ICD-10-CM

## 2013-02-07 NOTE — Progress Notes (Signed)
S:   Patient arrives very earl for visit - waited > 45 minutes for appointment .   Patient arrives stating she is "miserable".   She notes that she has NOT slept all night and that she has NOT been sleeping well since the death of her father on 01/23/2013.   She notes his funeral was ~ 6/20.   All of these things have been stressful for her.   She recognizes that she is now the "oldest person" in the family.      She reports multiple readings with symptoms of hypoglycemia.   O:   CBGs improved overall.   Several readings < 60     A/P:   Longstanding Diabetes requiring insulin currently experiencing low CBGs likely related to decreased PO intake following death of her father.   She admits to limited exercise and is willing to improve her activity level.   Decreased dose of lantus from 60 units to 50 units daily in an attempt to minimize risk of hypoglycemia.   Patient is only taking Novolog dose when she eats.     Follow up with Dr. Gwenlyn Saran (new doctor visit) and then with me in August for continued support and adjustment.   Encouraged her to take it one day at a time and try to be more active.    She left the appointment with a slightly improved mood.   Total time in counseling 35 minutes.

## 2013-02-07 NOTE — Assessment & Plan Note (Signed)
Longstanding Diabetes requiring insulin currently experiencing low CBGs likely related to decreased PO intake following death of her father.   She admits to limited exercise and is willing to improve her activity level.   Decreased dose of lantus from 60 units to 50 units daily in an attempt to minimize risk of hypoglycemia.   Patient is only taking Novolog dose when she eats.     Follow up with Dr. Gwenlyn Saran (new doctor visit) and then with me in August for continued support and adjustment.   Encouraged her to take it one day at a time and try to be more active.    She left the appointment with a slightly improved mood.   Total time in counseling 35 minutes.

## 2013-02-07 NOTE — Patient Instructions (Addendum)
Decrease your lantus to 50 units once daily.   Schedule visit with Dr. Gwenlyn Saran  Next visit with Dr. Raymondo Band in August.

## 2013-02-08 NOTE — Progress Notes (Signed)
Patient ID: [name], female   DOB: 08/09/1955, 57 y.o.   MRN: [id number] Reviewed: Agree with Dr. Koval's documentation and management. 

## 2013-02-16 ENCOUNTER — Other Ambulatory Visit: Payer: Self-pay | Admitting: *Deleted

## 2013-02-18 MED ORDER — OMEPRAZOLE 20 MG PO CPDR: 20.0000 mg | DELAYED_RELEASE_CAPSULE | Freq: Every day | ORAL | Status: DC

## 2013-02-18 MED ORDER — TOPIRAMATE 50 MG PO TABS: 50.0000 mg | ORAL_TABLET | Freq: Every day | ORAL | Status: DC | PRN

## 2013-02-18 MED ORDER — AMLODIPINE BESYLATE 10 MG PO TABS: 10.0000 mg | ORAL_TABLET | Freq: Every day | ORAL | Status: DC

## 2013-02-18 MED ORDER — CLONIDINE HCL 0.2 MG PO TABS: 0.2000 mg | ORAL_TABLET | Freq: Two times a day (BID) | ORAL | Status: DC

## 2013-02-18 MED ORDER — METOPROLOL-HYDROCHLOROTHIAZIDE 100-25 MG PO TABS: 1.0000 | ORAL_TABLET | Freq: Every day | ORAL | Status: DC

## 2013-02-23 ENCOUNTER — Ambulatory Visit (INDEPENDENT_AMBULATORY_CARE_PROVIDER_SITE_OTHER): Payer: Medicare HMO | Admitting: Family Medicine

## 2013-02-23 ENCOUNTER — Encounter: Payer: Self-pay | Admitting: Family Medicine

## 2013-02-23 VITALS — BP 166/90 | HR 84 | Ht 62.0 in | Wt 279.0 lb

## 2013-02-23 DIAGNOSIS — I10 Essential (primary) hypertension: Secondary | ICD-10-CM

## 2013-02-23 DIAGNOSIS — IMO0001 Reserved for inherently not codable concepts without codable children: Secondary | ICD-10-CM

## 2013-02-23 DIAGNOSIS — E119 Type 2 diabetes mellitus without complications: Secondary | ICD-10-CM

## 2013-02-23 DIAGNOSIS — Z8673 Personal history of transient ischemic attack (TIA), and cerebral infarction without residual deficits: Secondary | ICD-10-CM

## 2013-02-23 DIAGNOSIS — E1165 Type 2 diabetes mellitus with hyperglycemia: Secondary | ICD-10-CM

## 2013-02-23 DIAGNOSIS — B351 Tinea unguium: Secondary | ICD-10-CM

## 2013-02-23 MED ORDER — TRAMADOL HCL 50 MG PO TABS: 50.0000 mg | ORAL_TABLET | Freq: Three times a day (TID) | ORAL | Status: DC | PRN

## 2013-02-23 MED ORDER — CLOPIDOGREL BISULFATE 75 MG PO TABS: 75.0000 mg | ORAL_TABLET | Freq: Every day | ORAL | Status: DC

## 2013-02-23 NOTE — Progress Notes (Signed)
Patient ID: [name]    DOB: 1955-08-15, 57 y.o.   MRN: [id number] --- Subjective:  [name] is a 57 y.o.female with h/o DM2, CVA, atrial fibrillation, CKD2, depression who presents to meet her new doctor as well as to refill medications.  Concerns include: - diet: patient doesn't understand why she is gaining weight. She drinks diet drinks, eats small meals and tries to avoid carbs. She eats fruit in between meals like bananas and apples. She doesn't exercise often because since her stroke she has trouble seeing well and needs assistance so as not loose her balance. She thinks she could have a friend or family member walk with her twice a week.   - diabetes: takes lantus: 50 units at night time and novolog 20 units tid with meals. She had hypoglycemic episodes in June with CBG of 42 and 50. She attributed it to her not eating as well with the recent death of her father. Her lantus was decreased from 60 to 50 and she has recently been eating better. CBG's after eating have been generally in 200's to 500's. She has CBG's in 100's about 3-4 times per week. No recent lows. No numbness or tingling in lower extremities. She does have a thickened toenail on left toenails that is darker and thicker despite lamisil cream she has been using.   - low back pain: present for years. She thinks it is associated with her large breasts. Worst with standing for long periods of time. Takes her a long time to do dishes or cook meals due to the pain.  No weakness in lower extremities, no bowel or urinary incontinence. Using celebrex which doesn't help  - HTN: no chest pain, no shortness of breath, no lower extremity swelling. Compliant with meds: amlodipine 10, clonidine 0.2mg  bid, lisinopril 40mg  daily and metoprolol/hctz 100/25.   ROS: see HPI Past Medical History: reviewed and updated medications and allergies. Social History: Tobacco: none  Objective: Filed Vitals:   02/23/13 1439  BP: 166/90  Pulse: 84     Physical Examination:   General appearance - alert, well appearing, and in no distress Chest - clear to auscultation, no wheezes, rales or rhonchi, symmetric air entry Heart - normal rate, regular rhythm, normal S1, S2, no murmurs, rubs, clicks or gallops Left toe: dark discolored part of toenail with thickened toenail. No broken skin or erythema.  No tenderness along lumbar spine,  5/5 strength with knee flexion and extension bilaterally.

## 2013-02-23 NOTE — Patient Instructions (Signed)
I recommend that you meet with our Registered Dietitian (Nutritionist), Dr. Vickki Muff for help addressing your dietary and eating habits.  You can schedule an appointment with her by calling her directly at [phone].   Increase lantus to 55units daily. Keep the novolog the same.   For the blood pressure, we'll keep it the same and I'll see you at the end of august for both the A1c and the blood pressure.

## 2013-02-24 NOTE — Assessment & Plan Note (Addendum)
Refilled plavix. Patient asking for home health aid to help with meal prep in context of her feeling tired.

## 2013-02-24 NOTE — Assessment & Plan Note (Signed)
Based on cbg readings, glucose still not controled. No new episodes of hypoglycemia. Will increase lantus to 55 units daily. Keep novolog: 20 tid.  Podiatry referral for foot care.

## 2013-02-24 NOTE — Assessment & Plan Note (Signed)
Not well controled. Currently on 5 BP meds with poor control. Will hold on change in medicine for now and follow up closely. Patient to meet with nutritionist to talk about diet strategies. Hope that weight loss may also help with BP control. If still high at next visit, consider increasing clonidine to 0.3mg  bid.

## 2013-02-24 NOTE — Assessment & Plan Note (Signed)
Podiatry referral in setting of diabetes.

## 2013-03-10 ENCOUNTER — Ambulatory Visit (INDEPENDENT_AMBULATORY_CARE_PROVIDER_SITE_OTHER): Payer: Medicare HMO | Admitting: Pharmacist

## 2013-03-10 ENCOUNTER — Encounter: Payer: Self-pay | Admitting: Pharmacist

## 2013-03-10 VITALS — BP 116/79 | HR 62 | Ht 63.0 in | Wt 277.0 lb

## 2013-03-10 DIAGNOSIS — IMO0001 Reserved for inherently not codable concepts without codable children: Secondary | ICD-10-CM

## 2013-03-10 DIAGNOSIS — E1165 Type 2 diabetes mellitus with hyperglycemia: Secondary | ICD-10-CM

## 2013-03-10 NOTE — Assessment & Plan Note (Signed)
Diabetes diagnosed in 2010 currently withimproved control which is better than her most recent A1c. Lab Results  Component Value Date   HGBA1C 9.8 12/28/2012  Home fasting CBG readings of 100-150  and 2 hour post-prandial/random CBG readings of 100-150.  Denies hypoglycemic events and is able to verbalize appropriate hypoglycemia management plan.  Reports adherence with medication. Patient is meeting goals for blood glucose control at this time. No change at this time.  Encouraged more activity including helping with more activities at he church which she enjoys.   She appears to be in a slightly better mood from prior visit.  Written patient instructions provided.  Follow up in Pharmacist Clinic Visit 4-6 weeks   Total time in face to face counseling 30 minutes.  Patient seen with Ronaldo Miyamoto Judkins [facility].

## 2013-03-10 NOTE — Progress Notes (Signed)
S:    Patient arrives walking with assistance of cane.    She presents to the clinic for diabetes follow up.    Patient reports having history of Diabetes since the year of 2010. Patient has taken insulin for the last two years.   She admits she has struggled with her life for her entire life.   O:  Home CBG average readings 100-150 for last 30 days.     A/P: Diabetes diagnosed in 2010 currently withimproved control which is better than her most recent A1c. Lab Results  Component Value Date   HGBA1C 9.8 12/28/2012  Home fasting CBG readings of 100-150  and 2 hour post-prandial/random CBG readings of 100-150.  Denies hypoglycemic events and is able to verbalize appropriate hypoglycemia management plan.  Reports adherence with medication. Patient is meeting goals for blood glucose control at this time. No change at this time.  Encouraged more activity including helping with more activities at he church which she enjoys.   She appears to be in a slightly better mood from prior visit.  Written patient instructions provided.  Follow up in Pharmacist Clinic Visit 4-6 weeks   Total time in face to face counseling 30 minutes.  Patient seen with Ronaldo Miyamoto Judkins [facility] 4.

## 2013-03-10 NOTE — Patient Instructions (Addendum)
Keep up the great work with diet and exercise.   Keep up the exercise.   Work on Therapist, music you enjoy.   Next visit with Dr. Gwenlyn Saran.    Visit with Rx clinic 1 month after visit with Dr. Gwenlyn Saran.   Goal for weight loss is 2 pounds by next pharmacist visit.

## 2013-03-11 NOTE — Progress Notes (Signed)
Patient ID: [name], female   DOB: 04/10/1956, 57 y.o.   MRN: [id number] Reviewed: Agree with Dr. Koval's documentation and management. 

## 2013-03-24 ENCOUNTER — Other Ambulatory Visit: Payer: Self-pay | Admitting: *Deleted

## 2013-03-24 MED ORDER — CLONIDINE HCL 0.2 MG PO TABS: 0.2000 mg | ORAL_TABLET | Freq: Two times a day (BID) | ORAL | Status: DC

## 2013-03-25 ENCOUNTER — Other Ambulatory Visit: Payer: Self-pay | Admitting: *Deleted

## 2013-03-25 MED ORDER — ROSUVASTATIN CALCIUM 10 MG PO TABS: 10.0000 mg | ORAL_TABLET | Freq: Every day | ORAL | Status: DC

## 2013-03-25 MED ORDER — METOPROLOL-HYDROCHLOROTHIAZIDE 100-25 MG PO TABS: 1.0000 | ORAL_TABLET | Freq: Every day | ORAL | Status: DC

## 2013-03-25 MED ORDER — CITALOPRAM HYDROBROMIDE 20 MG PO TABS: 20.0000 mg | ORAL_TABLET | Freq: Every day | ORAL | Status: DC

## 2013-03-30 ENCOUNTER — Ambulatory Visit: Payer: Medicare HMO | Admitting: Family Medicine

## 2013-04-06 ENCOUNTER — Other Ambulatory Visit: Payer: Self-pay | Admitting: *Deleted

## 2013-04-06 MED ORDER — METFORMIN HCL 1000 MG PO TABS: 1000.0000 mg | ORAL_TABLET | Freq: Two times a day (BID) | ORAL | Status: DC

## 2013-05-08 ENCOUNTER — Emergency Department (HOSPITAL_COMMUNITY)
Admission: EM | Admit: 2013-05-08 | Discharge: 2013-05-08 | Disposition: A | Discharge status: Home / Self Care | Payer: Medicare HMO | Attending: Emergency Medicine | Admitting: Emergency Medicine

## 2013-05-08 ENCOUNTER — Encounter (HOSPITAL_COMMUNITY): Payer: Self-pay | Admitting: Emergency Medicine

## 2013-05-08 ENCOUNTER — Emergency Department (HOSPITAL_COMMUNITY): Payer: Medicare HMO

## 2013-05-08 DIAGNOSIS — Z8673 Personal history of transient ischemic attack (TIA), and cerebral infarction without residual deficits: Secondary | ICD-10-CM | POA: Insufficient documentation

## 2013-05-08 DIAGNOSIS — Z7982 Long term (current) use of aspirin: Secondary | ICD-10-CM | POA: Insufficient documentation

## 2013-05-08 DIAGNOSIS — Z791 Long term (current) use of non-steroidal anti-inflammatories (NSAID): Secondary | ICD-10-CM | POA: Insufficient documentation

## 2013-05-08 DIAGNOSIS — I10 Essential (primary) hypertension: Secondary | ICD-10-CM | POA: Insufficient documentation

## 2013-05-08 DIAGNOSIS — Z7902 Long term (current) use of antithrombotics/antiplatelets: Secondary | ICD-10-CM | POA: Insufficient documentation

## 2013-05-08 DIAGNOSIS — Z79899 Other long term (current) drug therapy: Secondary | ICD-10-CM | POA: Insufficient documentation

## 2013-05-08 DIAGNOSIS — R0789 Other chest pain: Secondary | ICD-10-CM | POA: Insufficient documentation

## 2013-05-08 DIAGNOSIS — R002 Palpitations: Secondary | ICD-10-CM | POA: Insufficient documentation

## 2013-05-08 DIAGNOSIS — Z794 Long term (current) use of insulin: Secondary | ICD-10-CM | POA: Insufficient documentation

## 2013-05-08 DIAGNOSIS — R110 Nausea: Secondary | ICD-10-CM | POA: Insufficient documentation

## 2013-05-08 DIAGNOSIS — E7800 Pure hypercholesterolemia, unspecified: Secondary | ICD-10-CM | POA: Insufficient documentation

## 2013-05-08 DIAGNOSIS — R079 Chest pain, unspecified: Secondary | ICD-10-CM

## 2013-05-08 DIAGNOSIS — R42 Dizziness and giddiness: Secondary | ICD-10-CM | POA: Insufficient documentation

## 2013-05-08 DIAGNOSIS — E119 Type 2 diabetes mellitus without complications: Secondary | ICD-10-CM | POA: Insufficient documentation

## 2013-05-08 DIAGNOSIS — R011 Cardiac murmur, unspecified: Secondary | ICD-10-CM | POA: Insufficient documentation

## 2013-05-08 DIAGNOSIS — I4891 Unspecified atrial fibrillation: Secondary | ICD-10-CM | POA: Insufficient documentation

## 2013-05-08 LAB — POCT I-STAT TROPONIN I: Troponin i, poc: 0 ng/mL (ref 0.00–0.08)

## 2013-05-08 LAB — COMPREHENSIVE METABOLIC PANEL
ALT: 17 U/L (ref 0–35)
AST: 25 U/L (ref 0–37)
Albumin: 3.7 g/dL (ref 3.5–5.2)
CO2: 22 mEq/L (ref 19–32)
Chloride: 101 mEq/L (ref 96–112)
Creatinine, Ser: 0.77 mg/dL (ref 0.50–1.10)
GFR calc non Af Amer: >90 mL/min (ref >90)
Total Bilirubin: 0.3 mg/dL (ref 0.3–1.2)

## 2013-05-08 LAB — CBC
Hemoglobin: 12.5 g/dL (ref 12.0–15.0)
MCV: 69.8 fL — ABNORMAL LOW (ref 78.0–100.0)
Platelets: 307 10*3/uL (ref 150–400)
RBC: 5.3 MIL/uL — ABNORMAL HIGH (ref 3.87–5.11)
RDW: 15.9 % — ABNORMAL HIGH (ref 11.5–15.5)
WBC: 7.9 10*3/uL (ref 4.0–10.5)

## 2013-05-08 MED ORDER — MORPHINE SULFATE 4 MG/ML IJ SOLN
4.0000 mg | Freq: Once | INTRAMUSCULAR | Status: AC
Start: 2013-05-08 — End: 2013-05-08
  Administered 2013-05-08: 4 mg via INTRAVENOUS
  Filled 2013-05-08: qty 1

## 2013-05-08 MED ORDER — ASPIRIN 81 MG PO CHEW
324.0000 mg | CHEWABLE_TABLET | Freq: Once | ORAL | Status: AC
  Administered 2013-05-08: 324 mg via ORAL
  Filled 2013-05-08: qty 4

## 2013-05-08 MED ORDER — ONDANSETRON HCL 4 MG/2ML IJ SOLN
4.0000 mg | Freq: Once | INTRAMUSCULAR | Status: AC
  Administered 2013-05-08: 4 mg via INTRAVENOUS
  Filled 2013-05-08: qty 2

## 2013-05-08 MED ORDER — MORPHINE SULFATE 4 MG/ML IJ SOLN
6.0000 mg | Freq: Once | INTRAMUSCULAR | Status: AC
  Administered 2013-05-08: 6 mg via INTRAVENOUS
  Filled 2013-05-08: qty 2

## 2013-05-08 NOTE — ED Notes (Signed)
Attempted IV access. Unsuccessful. IV team paged.

## 2013-05-08 NOTE — ED Notes (Signed)
Pt was at church and began to feel like her heart was racing initial HR was 130s. Pt has hx of A-fib. Pt took 2 Nitro with no change. Vitals b/p 176/106, 98p, 98% 4L.

## 2013-05-08 NOTE — ED Provider Notes (Signed)
CSN: [id number]     Arrival date & time 05/08/13  1326 History   First MD Initiated Contact with Patient 05/08/13 1342     Chief Complaint  Patient presents with  . Tachycardia   (Consider location/radiation/quality/duration/timing/severity/associated sxs/prior Treatment) HPI Comments: Patient with history of diabetes, atrial fibrillation, high blood pressure, high cholesterol, catheterization performed in 05/2011 showing mild diffuse CAD -- presents with complaint of racing heart and sensation of indigestion in her chest. Patient was at church and at rest when the symptoms began. Her initial heart rate was in the 130s per EMS which was improved into the 90s when patient arrived at the hospital. Rhythm was sinus tachycardia. Patient did take 2 nitroglycerin prior to EMS arrival without any improvement in symptoms. Patient had nausea but no vomiting. She had lightheadedness but no syncope. She had no diaphoresis. She denies cough or shortness of breath. Symptoms began approximately noon. Course is constant. Nothing makes symptoms better or worse.  The history is provided by the patient and medical records.    Past Medical History  Diagnosis Date  . Hypertension   . Hypertension     Hypertensive urgency 05/2006  . Atrial fibrillation   . Hypercholesteremia   . 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 Diabetes mellitus     type 2   Past Surgical History  Procedure Laterality Date  . Tonsilectomy, adenoidectomy, bilateral myringotomy and tubes  age 73  . Dilation and curettage of uterus  1976  . Meniscus repair  03/09    right knee  . Tonsillectomy     Family History  Problem Relation Age of Onset  . Heart failure Mother   . Diabetes Father   . Hypertension Father   . Lung cancer Sister    History  Substance Use Topics  . Smoking status: Never Smoker   . Smokeless tobacco: Never Used  . Alcohol Use: No   OB History   Grav Para Term Preterm  Abortions TAB SAB Ect Mult Living                 Review of Systems  Constitutional: Negative for fever and diaphoresis.  HENT: Negative for neck pain.   Eyes: Negative for redness.  Respiratory: Negative for cough and shortness of breath.   Cardiovascular: Positive for chest pain and palpitations. Negative for leg swelling.  Gastrointestinal: Positive for nausea. Negative for vomiting and abdominal pain.  Genitourinary: Negative for dysuria.  Musculoskeletal: Negative for back pain.  Skin: Negative for rash.  Neurological: Positive for light-headedness. Negative for syncope.    Allergies  Propoxyphene-acetaminophen  Home Medications   Current Outpatient Rx  Name  Route  Sig  Dispense  Refill  . amLODipine (NORVASC) 10 MG tablet   Oral   Take 1 tablet (10 mg total) by mouth daily.   30 tablet   3   . aspirin EC 81 MG tablet   Oral   Take 81 mg by mouth daily.           . celecoxib (CELEBREX) 200 MG capsule   Oral   Take 1 capsule (200 mg total) by mouth daily.   30 capsule   0   . citalopram (CELEXA) 20 MG tablet   Oral   Take 1 tablet (20 mg total) by mouth daily.   90 tablet   3     Refill [id number]   . cloNIDine (CATAPRES) 0.2 MG tablet  Oral   Take 1 tablet (0.2 mg total) by mouth 2 (two) times daily.   180 tablet   3   . clopidogrel (PLAVIX) 75 MG tablet   Oral   Take 75 mg by mouth every morning.           . insulin aspart (NOVOLOG) 100 UNIT/ML injection   Subcutaneous   Inject 20 Units into the skin 4 (four) times daily.         . insulin glargine (LANTUS) 100 UNIT/ML injection   Subcutaneous   Inject 55 Units into the skin at bedtime.    10 mL   0   . lisinopril (PRINIVIL,ZESTRIL) 40 MG tablet   Oral   Take 1 tablet (40 mg total) by mouth daily.   30 tablet   6   . metFORMIN (GLUCOPHAGE) 1000 MG tablet   Oral   Take 1 tablet (1,000 mg total) by mouth 2 (two) times daily with a meal.   60 tablet   6     Refill [id number]   .  metoprolol-hydrochlorothiazide (LOPRESSOR HCT) 100-25 MG per tablet   Oral   Take 1 tablet by mouth daily.   90 tablet   3   . nitroGLYCERIN (NITROSTAT) 0.4 MG SL tablet   Sublingual   Place 0.4 mg under the tongue every 5 (five) minutes as needed. For chest pain          . nystatin (MYCOSTATIN/NYSTOP) 100000 UNIT/GM POWD   Topical   Apply 1 g topically daily as needed (Rash).         Marland Kitchen omeprazole (PRILOSEC) 20 MG capsule   Oral   Take 1 capsule (20 mg total) by mouth at bedtime.   30 capsule   2   . rosuvastatin (CRESTOR) 10 MG tablet   Oral   Take 1 tablet (10 mg total) by mouth at bedtime.   90 tablet   3   . terbinafine (LAMISIL) 250 MG tablet   Oral   Take 250 mg by mouth at bedtime.          . Blood Glucose Monitoring Suppl (ONE TOUCH ULTRA 2) W/DEVICE KIT   Does not apply   1 kit by Does not apply route 4 (four) times daily - after meals and at bedtime.   1 each   3   . glucose blood test strip   Other   1 each by Other route 4 (four) times daily - after meals and at bedtime. Use as instructed   100 each   12   . Insulin Pen Needle (B-D ULTRAFINE III SHORT PEN) 31G X 8 MM MISC   Does not apply   1 Container by Does not apply route 4 (four) times daily. Dispense QS for 4 shots daily (1 lantus and 3 novolog)   1 each   prn   . nystatin (MYCOSTATIN) powder   Topical   Apply topically 3 (three) times daily.   15 g   3   . ONE TOUCH LANCETS MISC   Does not apply   1 each by Does not apply route 4 (four) times daily - after meals and at bedtime.   200 each   0    BP 180/89  Pulse 100  Temp(Src) 98.1 F (36.7 C) (Oral)  Resp 18  SpO2 100% Physical Exam  Nursing note and vitals reviewed. Constitutional: She appears well-developed and well-nourished.  HENT:  Head: Normocephalic and atraumatic.  Mouth/Throat: Mucous membranes  are normal. Mucous membranes are not dry.  Eyes: Conjunctivae are normal.  Neck: Trachea normal and normal range of  motion. Neck supple. Normal carotid pulses and no JVD present. No muscular tenderness present. Carotid bruit is not present. No tracheal deviation present.  Cardiovascular: Normal rate, regular rhythm, S1 normal, S2 normal, normal heart sounds and intact distal pulses.  Exam reveals no decreased pulses.   No murmur heard. Pulmonary/Chest: Effort normal. No respiratory distress. She has no wheezes. She exhibits no tenderness.  Abdominal: Soft. Normal aorta and bowel sounds are normal. There is no tenderness. There is no rebound and no guarding.  Musculoskeletal: Normal range of motion.  Neurological: She is alert.  Skin: Skin is warm and dry. She is not diaphoretic. No cyanosis. No pallor.  Psychiatric: She has a normal mood and affect.    ED Course  Procedures (including critical care time) Labs Review Labs Reviewed  CBC - Abnormal; Notable for the following:    RBC 5.30 (*)    MCV 69.8 (*)    MCH 23.6 (*)    RDW 15.9 (*)    All other components within normal limits  COMPREHENSIVE METABOLIC PANEL - Abnormal; Notable for the following:    Glucose, Bld 211 (*)    All other components within normal limits  POCT I-STAT TROPONIN I   Imaging Review Dg Chest 2 View  05/08/2013   CLINICAL DATA:  Chest pain and shortness of breath.  EXAM: CHEST - 2 VIEW  COMPARISON:  12/20/2011  FINDINGS: The heart size and mediastinal contours are within normal limits. There is no evidence of pulmonary edema, consolidation, pneumothorax, nodule or pleural fluid. The visualized skeletal structures are unremarkable.  IMPRESSION: No active disease.   Electronically Signed   By: Irish Lack [name]   On: 05/08/2013 14:47   Patient seen and examined. Work-up initiated. Medications ordered.   Vital signs reviewed and are as follows: Filed Vitals:   05/08/13 1505  BP: 167/87  Pulse: 86  Temp:   Resp: 18    Date: 05/08/2013  Rate: 92  Rhythm: normal sinus rhythm  QRS Axis: normal  Intervals: normal   ST/T Wave abnormalities: nonspecific T wave changes  Conduction Disutrbances:none  Narrative Interpretation:   Old EKG Reviewed: unchanged from 07/07/12  Cath report 05/2013 by Dr. Sharyn Lull: LV showed good LV systolic function, EF of 55-60%. Left main  was very short, which was patent. LAD has 10-15% ostial stenosis.  Diagonal 1 and 2 were very small, which were patent. Ramus was small,  which was patent. Left circumflex was large, which has 15-20% mid-  stenosis. OM-1 is large, which is patent. OM-2 is  very small, which is patent. RCA has 15-20% mid-stenosis. PLV branch  was very small, which is diffusely diseased. PDA is small, which is  patent.   3:57 PM Patient d/w Dr. Effie Shy. Spoke with Dr. Sharyn Lull. We will order additional set of markers. If neg and CP free, she can be d/c to home and f/u with Dr. Sharyn Lull as outpatient.   3:59 PM Handoff to [facility] at shift change. Pt pending 2nd marker.   MDM   1. Chest pain   2. Palpitations    Pending completion of work-up.     Renne Crigler, PA-C 05/08/13 1600

## 2013-05-08 NOTE — ED Notes (Signed)
IV team at the bedside. 

## 2013-05-08 NOTE — ED Notes (Signed)
Pt c/o a feeling of heart racing while at church. Pt states she feels chest tightness and a feeling of indigestion. Pt experienced sob, and diaphoresis when she had the episode of heart racing. Pt took 2 Nitro with no relief.

## 2013-05-08 NOTE — ED Provider Notes (Signed)
Pt signed out to me by Rhea Bleacher, PA-C at shift change. Troponin at 17:15 pending.  If normal and no longer experiening chest pain, pt may be sent home.  Otherwise, may need to consult Dr. Sharyn Lull for admittance.  Second troponin: negative.  Pt states chest pain is better since arriving in ED. Pt states she feels comfortable being discharged home. Vitals: WNL. Pt stable at this time. Alert and oriented. Heart: RRR, Lungs: CTAB, no respiratory distress.  Return precautions provided.  Pt verbalized understanding and agreement with tx plan. Vitals: unremarkable. Discharged in stable condition.    Discussed pt with attending during ED encounter and agrees with plan.   Junius Finner, PA-C 05/09/13 1609

## 2013-05-08 NOTE — ED Notes (Signed)
O'Malley at bedside.

## 2013-05-08 NOTE — ED Provider Notes (Signed)
Medical screening examination/treatment/procedure(s) were performed by non-physician practitioner and as supervising physician I was immediately available for consultation/collaboration.  Flint Melter, MD 05/08/13 [phone]

## 2013-05-08 NOTE — ED Notes (Signed)
Repaged IV team to inform that pt is in the room.

## 2013-05-09 NOTE — ED Provider Notes (Signed)
Medical screening examination/treatment/procedure(s) were conducted as a shared visit with non-physician practitioner(s) and myself.  I personally evaluated the patient during the encounter  I spoke with patient and her daughter about her labs. I counseled them patient is doing well by our labs and is stable for discharge. Daughter is concerned about patient going home as she cannot hear her well despite them having adjacent bedrooms. I informed them I cannot admit her to the hospital for that reason. They understand and will f/u with PCP.  Dagmar Hait, MD 05/09/13 [phone]

## 2013-07-10 ENCOUNTER — Emergency Department (HOSPITAL_COMMUNITY): Payer: Medicare HMO

## 2013-07-10 ENCOUNTER — Encounter (HOSPITAL_COMMUNITY): Payer: Self-pay | Admitting: Emergency Medicine

## 2013-07-10 ENCOUNTER — Emergency Department (HOSPITAL_COMMUNITY)
Admission: EM | Admit: 2013-07-10 | Discharge: 2013-07-10 | Disposition: A | Discharge status: Home / Self Care | Payer: Medicare HMO | Attending: Emergency Medicine | Admitting: Emergency Medicine

## 2013-07-10 DIAGNOSIS — J3489 Other specified disorders of nose and nasal sinuses: Secondary | ICD-10-CM | POA: Insufficient documentation

## 2013-07-10 DIAGNOSIS — E7800 Pure hypercholesterolemia, unspecified: Secondary | ICD-10-CM | POA: Insufficient documentation

## 2013-07-10 DIAGNOSIS — Z79899 Other long term (current) drug therapy: Secondary | ICD-10-CM | POA: Insufficient documentation

## 2013-07-10 DIAGNOSIS — J111 Influenza due to unidentified influenza virus with other respiratory manifestations: Secondary | ICD-10-CM

## 2013-07-10 DIAGNOSIS — I10 Essential (primary) hypertension: Secondary | ICD-10-CM | POA: Insufficient documentation

## 2013-07-10 DIAGNOSIS — J019 Acute sinusitis, unspecified: Secondary | ICD-10-CM | POA: Insufficient documentation

## 2013-07-10 DIAGNOSIS — I4891 Unspecified atrial fibrillation: Secondary | ICD-10-CM | POA: Insufficient documentation

## 2013-07-10 DIAGNOSIS — J029 Acute pharyngitis, unspecified: Secondary | ICD-10-CM | POA: Insufficient documentation

## 2013-07-10 DIAGNOSIS — E119 Type 2 diabetes mellitus without complications: Secondary | ICD-10-CM | POA: Insufficient documentation

## 2013-07-10 DIAGNOSIS — Z794 Long term (current) use of insulin: Secondary | ICD-10-CM | POA: Insufficient documentation

## 2013-07-10 DIAGNOSIS — J069 Acute upper respiratory infection, unspecified: Secondary | ICD-10-CM

## 2013-07-10 DIAGNOSIS — Z7982 Long term (current) use of aspirin: Secondary | ICD-10-CM | POA: Insufficient documentation

## 2013-07-10 DIAGNOSIS — R0602 Shortness of breath: Secondary | ICD-10-CM | POA: Insufficient documentation

## 2013-07-10 LAB — CBC WITH DIFFERENTIAL/PLATELET
HCT: 38.4 % (ref 36.0–46.0)
Hemoglobin: 12.5 g/dL (ref 12.0–15.0)
Lymphocytes Relative: 39 % (ref 12–46)
Lymphs Abs: 3.1 10*3/uL (ref 0.7–4.0)
MCH: 23.5 pg — ABNORMAL LOW (ref 26.0–34.0)
Monocytes Absolute: 0.5 10*3/uL (ref 0.1–1.0)
Neutro Abs: 4.3 10*3/uL (ref 1.7–7.7)
Neutrophils Relative %: 54 % (ref 43–77)
Platelets: 288 10*3/uL (ref 150–400)
RBC: 5.33 MIL/uL — ABNORMAL HIGH (ref 3.87–5.11)
WBC: 8 10*3/uL (ref 4.0–10.5)

## 2013-07-10 LAB — COMPREHENSIVE METABOLIC PANEL
ALT: 18 U/L (ref 0–35)
Albumin: 3.5 g/dL (ref 3.5–5.2)
Alkaline Phosphatase: 97 U/L (ref 39–117)
BUN: 12 mg/dL (ref 6–23)
CO2: 23 mEq/L (ref 19–32)
GFR calc Af Amer: 80 mL/min — ABNORMAL LOW (ref >90)
GFR calc non Af Amer: 69 mL/min — ABNORMAL LOW (ref >90)
Glucose, Bld: 117 mg/dL — ABNORMAL HIGH (ref 70–99)
Potassium: 3.9 mEq/L (ref 3.5–5.1)
Sodium: 141 mEq/L (ref 135–145)

## 2013-07-10 MED ORDER — ALBUTEROL SULFATE (5 MG/ML) 0.5% IN NEBU
2.5000 mg | INHALATION_SOLUTION | Freq: Once | RESPIRATORY_TRACT | Status: AC
  Administered 2013-07-10: 2.5 mg via RESPIRATORY_TRACT
  Filled 2013-07-10: qty 0.5

## 2013-07-10 MED ORDER — ACETAMINOPHEN 325 MG PO TABS
650.0000 mg | ORAL_TABLET | Freq: Once | ORAL | Status: AC
  Administered 2013-07-10: 650 mg via ORAL
  Filled 2013-07-10: qty 2

## 2013-07-10 MED ORDER — IBUPROFEN 800 MG PO TABS
800.0000 mg | ORAL_TABLET | Freq: Once | ORAL | Status: AC
  Administered 2013-07-10: 800 mg via ORAL
  Filled 2013-07-10: qty 1

## 2013-07-10 MED ORDER — HYDROCODONE-ACETAMINOPHEN 5-325 MG PO TABS: 2.0000 | ORAL_TABLET | ORAL | Status: DC | PRN

## 2013-07-10 NOTE — ED Notes (Signed)
Pt c/o fever/chills last night along with a cough, denies coughing anything up. sts she hasn't checked her temp at home but has chills and knows its a fever. sts she didn't get her flu shot and hasn't had a pneumonia shot and thinks she is getting the flu. C/o CP in center of chest with coughing only. Sob with exertion. Nad, skin warm and dry, resp e/u.

## 2013-07-10 NOTE — ED Provider Notes (Signed)
Medical screening examination/treatment/procedure(s) were performed by non-physician practitioner and as supervising physician I was immediately available for consultation/collaboration.  EKG Interpretation    Date/Time:  Sunday July 10 2013 09:38:20 EST Ventricular Rate:  78 PR Interval:  163 QRS Duration: 85 QT Interval:  393 QTC Calculation: 448 R Axis:   24 Text Interpretation:  Sinus rhythm Low voltage, precordial leads Borderline T abnormalities, diffuse leads Confirmed by Rhunette Croft, MD, Jansel Vonstein ([id number]) on 07/10/2013 9:43:33 AM             Derwood Kaplan, MD 07/10/13 1601

## 2013-07-10 NOTE — ED Notes (Signed)
Pt finished neb treatment, denies feeling any better.

## 2013-07-10 NOTE — ED Notes (Signed)
Pt returned from radiology.

## 2013-07-10 NOTE — ED Provider Notes (Signed)
CSN: [id number]     Arrival date & time 07/10/13  4098 History   First MD Initiated Contact with Patient 07/10/13 1011     Chief Complaint  Patient presents with  . Fever  . Cough   (Consider location/radiation/quality/duration/timing/severity/associated sxs/prior Treatment) Patient is a 57 y.o. female presenting with cough. The history is provided by the patient. No language interpreter was used.  Cough Cough characteristics:  Productive Sputum characteristics:  Nondescript Severity:  Moderate Onset quality:  Gradual Duration:  1 day Timing:  Constant Progression:  Worsening Chronicity:  New Smoker: no   Relieved by:  Nothing Worsened by:  Nothing tried Ineffective treatments:  None tried Associated symptoms: rhinorrhea, shortness of breath, sinus congestion and sore throat   Associated symptoms: no fever     Past Medical History  Diagnosis Date  . Hypertension   . Hypertension     Hypertensive urgency 05/2006  . Atrial fibrillation   . Hypercholesteremia   . 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 Diabetes mellitus     type 2   Past Surgical History  Procedure Laterality Date  . Tonsilectomy, adenoidectomy, bilateral myringotomy and tubes  age 32  . Dilation and curettage of uterus  1976  . Meniscus repair  03/09    right knee  . Tonsillectomy     Family History  Problem Relation Age of Onset  . Heart failure Mother   . Diabetes Father   . Hypertension Father   . Lung cancer Sister    History  Substance Use Topics  . Smoking status: Never Smoker   . Smokeless tobacco: Never Used  . Alcohol Use: No   OB History   Grav Para Term Preterm Abortions TAB SAB Ect Mult Living                 Review of Systems  Constitutional: Negative for fever.  HENT: Positive for rhinorrhea and sore throat.   Respiratory: Positive for cough and shortness of breath.   All other systems reviewed and are negative.    Allergies   Propoxyphene-acetaminophen  Home Medications   Current Outpatient Rx  Name  Route  Sig  Dispense  Refill  . amLODipine (NORVASC) 10 MG tablet   Oral   Take 1 tablet (10 mg total) by mouth daily.   30 tablet   3   . aspirin EC 81 MG tablet   Oral   Take 81 mg by mouth daily.           . Blood Glucose Monitoring Suppl (ONE TOUCH ULTRA 2) W/DEVICE KIT   Does not apply   1 kit by Does not apply route 4 (four) times daily - after meals and at bedtime.   1 each   3   . celecoxib (CELEBREX) 200 MG capsule   Oral   Take 1 capsule (200 mg total) by mouth daily.   30 capsule   0   . citalopram (CELEXA) 20 MG tablet   Oral   Take 1 tablet (20 mg total) by mouth daily.   90 tablet   3     Refill [id number]   . cloNIDine (CATAPRES) 0.2 MG tablet   Oral   Take 1 tablet (0.2 mg total) by mouth 2 (two) times daily.   180 tablet   3   . clopidogrel (PLAVIX) 75 MG tablet   Oral   Take 75 mg by mouth daily with breakfast.          .  glucose blood test strip   Other   1 each by Other route 4 (four) times daily - after meals and at bedtime. Use as instructed   100 each   12   . insulin aspart (NOVOLOG) 100 UNIT/ML injection   Subcutaneous   Inject 20 Units into the skin 4 (four) times daily.         . insulin glargine (LANTUS) 100 UNIT/ML injection   Subcutaneous   Inject 55 Units into the skin at bedtime.    10 mL   0   . Insulin Pen Needle (B-D ULTRAFINE III SHORT PEN) 31G X 8 MM MISC   Does not apply   1 Container by Does not apply route 4 (four) times daily. Dispense QS for 4 shots daily (1 lantus and 3 novolog)   1 each   prn   . lisinopril (PRINIVIL,ZESTRIL) 40 MG tablet   Oral   Take 1 tablet (40 mg total) by mouth daily.   30 tablet   6   . metFORMIN (GLUCOPHAGE) 1000 MG tablet   Oral   Take 1 tablet (1,000 mg total) by mouth 2 (two) times daily with a meal.   60 tablet   6     Refill [id number]   . metoprolol-hydrochlorothiazide (LOPRESSOR HCT)  100-25 MG per tablet   Oral   Take 1 tablet by mouth daily.   90 tablet   3   . nitroGLYCERIN (NITROSTAT) 0.4 MG SL tablet   Sublingual   Place 0.4 mg under the tongue every 5 (five) minutes as needed. For chest pain          . nystatin (MYCOSTATIN/NYSTOP) 100000 UNIT/GM POWD   Topical   Apply 1 g topically 3 (three) times daily.          Marland Kitchen omeprazole (PRILOSEC) 20 MG capsule   Oral   Take 1 capsule (20 mg total) by mouth at bedtime.   30 capsule   2   . ONE TOUCH LANCETS MISC   Does not apply   1 each by Does not apply route 4 (four) times daily - after meals and at bedtime.   200 each   0   . rosuvastatin (CRESTOR) 10 MG tablet   Oral   Take 1 tablet (10 mg total) by mouth at bedtime.   90 tablet   3    BP 165/97  Temp(Src) 98.3 F (36.8 C) (Oral)  Resp 18  Ht 5\' 2"  (1.575 m)  Wt 287 lb (130.182 kg)  BMI 52.48 kg/m2  SpO2 100% Physical Exam  Nursing note and vitals reviewed. Constitutional: She appears well-developed and well-nourished.  HENT:  Head: Normocephalic.  Right Ear: External ear normal.  Left Ear: External ear normal.  Nose: Nose normal.  Mouth/Throat: Oropharynx is clear and moist.  Tender maxillary sinuses.    Eyes: Conjunctivae are normal. Pupils are equal, round, and reactive to light.  Neck: Normal range of motion. Neck supple.  Cardiovascular: Normal rate and normal heart sounds.   Pulmonary/Chest: She has wheezes.  Abdominal: Soft.  Musculoskeletal: Normal range of motion.  Neurological: She is alert.  Skin: Skin is warm.  Psychiatric: She has a normal mood and affect.    ED Course  Procedures (including critical care time) Labs Review Labs Reviewed - No data to display Imaging Review No results found.  EKG Interpretation    Date/Time:  Sunday July 10 2013 09:38:20 EST Ventricular Rate:  78 PR Interval:  163 QRS  Duration: 85 QT Interval:  393 QTC Calculation: 448 R Axis:   24 Text Interpretation:  Sinus rhythm  Low voltage, precordial leads Borderline T abnormalities, diffuse leads Confirmed by NANAVATI, MD, ANKIT ([id number]) on 07/10/2013 9:43:33 AM            MDM   1. Sinusitis    Pt given tylenol,  No relief,  No change with albuterol.   I will treat discmfort and cough with Hydrocodone.   Pt advised to follow up at family practice for recheck.   Possible influenza vs uri   Elson Areas, [place] 07/10/13 1226

## 2013-07-10 NOTE — ED Notes (Signed)
Patient discharged to home with family. NAD.  

## 2013-07-12 ENCOUNTER — Other Ambulatory Visit: Payer: Self-pay | Admitting: Family Medicine

## 2013-07-12 ENCOUNTER — Ambulatory Visit (INDEPENDENT_AMBULATORY_CARE_PROVIDER_SITE_OTHER): Payer: Medicare HMO | Admitting: Family Medicine

## 2013-07-12 ENCOUNTER — Encounter: Payer: Self-pay | Admitting: Family Medicine

## 2013-07-12 VITALS — BP 208/113 | HR 73 | Ht 62.0 in | Wt 273.0 lb

## 2013-07-12 DIAGNOSIS — IMO0002 Reserved for concepts with insufficient information to code with codable children: Secondary | ICD-10-CM

## 2013-07-12 DIAGNOSIS — I10 Essential (primary) hypertension: Secondary | ICD-10-CM

## 2013-07-12 DIAGNOSIS — F3289 Other specified depressive episodes: Secondary | ICD-10-CM

## 2013-07-12 DIAGNOSIS — J3489 Other specified disorders of nose and nasal sinuses: Secondary | ICD-10-CM

## 2013-07-12 DIAGNOSIS — IMO0001 Reserved for inherently not codable concepts without codable children: Secondary | ICD-10-CM

## 2013-07-12 DIAGNOSIS — E1165 Type 2 diabetes mellitus with hyperglycemia: Secondary | ICD-10-CM

## 2013-07-12 DIAGNOSIS — R0981 Nasal congestion: Secondary | ICD-10-CM

## 2013-07-12 DIAGNOSIS — F329 Major depressive disorder, single episode, unspecified: Secondary | ICD-10-CM

## 2013-07-12 DIAGNOSIS — F32A Depression, unspecified: Secondary | ICD-10-CM

## 2013-07-12 LAB — COMPREHENSIVE METABOLIC PANEL
AST: 18 U/L (ref 0–37)
Alkaline Phosphatase: 89 U/L (ref 39–117)
BUN: 13 mg/dL (ref 6–23)
Calcium: 9.6 mg/dL (ref 8.4–10.5)
Chloride: 107 mEq/L (ref 96–112)
Creat: 0.91 mg/dL (ref 0.50–1.10)
Glucose, Bld: 94 mg/dL (ref 70–99)
Sodium: 143 mEq/L (ref 135–145)
Total Protein: 6.9 g/dL (ref 6.0–8.3)

## 2013-07-12 MED ORDER — CLONIDINE HCL 0.1 MG PO TABS
0.2000 mg | ORAL_TABLET | Freq: Once | ORAL | Status: AC
  Administered 2013-07-12: 0.2 mg via ORAL

## 2013-07-12 MED ORDER — CLONIDINE HCL 0.1 MG PO TABS
0.1000 mg | ORAL_TABLET | Freq: Once | ORAL | Status: AC
  Administered 2013-07-12: 0.1 mg via ORAL

## 2013-07-12 MED ORDER — CITALOPRAM HYDROBROMIDE 40 MG PO TABS: 40.0000 mg | ORAL_TABLET | Freq: Every day | ORAL | Status: DC

## 2013-07-12 MED ORDER — IPRATROPIUM BROMIDE 0.06 % NA SOLN: 2.0000 | Freq: Four times a day (QID) | NASAL | Status: DC

## 2013-07-12 NOTE — Progress Notes (Signed)
Patient ID: [name]    DOB: 09-24-1955, 57 y.o.   MRN: [id number] --- Subjective:  [name] is a 57 y.o.female who presents for follow up on nasal congestion as well as HTN and DM2.   - HTN: did not take her amlodipine, clonidine, lisinopril and metop/hctz this morning. She has had a mild headache at the top of her head for the last 2-3 days. She has had similar headaches before, unchanged in nature. No change in vision, no chest pain, no lower extremity swelling, no shortness of breath. She doesn't check her blood pressure regularly.   - DM: taking lantus 55 units at night time. Stopped taking novolog at mealtime because she would get low CBG's in the 40's. Since then, she has not had any hypoglycemic episodes.   - Depression: has been feeling overwhelmed by multiple stressors, including lack of water and heat at her house for the last 4-5 days. She has tried to get help but has not been able to. This is also the first holiday season that she is going to be spending since her father passed away. She used to call him and talk with him and feels like she doesn't have anybody to talk with anymore. She has been taking celexa 20mg  daily but doesn't feel any improvement.  Phq9: 18  - nasal congestion: was seen in ED on 07/10/13. She has difficulty breathing through her nose secondary to congestion. Symptoms have been ongoing since Friday. She denies any fever, any rhinorrhea, any second sickening.   ROS: see HPI Past Medical History: reviewed and updated medications and allergies. Social History: Tobacco: none  Objective: Filed Vitals:   07/12/13 1507  BP: 208/113  Pulse: 73    Physical Examination:   General appearance - alert, tired appearing. Neuro: CN2-12 grossly intact, 5/5 grip strength bilaterally, normal finger to nose bilaterally Chest - clear to auscultation, no wheezes, rales or rhonchi, symmetric air entry Heart - normal rate, regular rhythm, normal S1, S2, no  murmurs Extremities - no edema

## 2013-07-12 NOTE — Assessment & Plan Note (Addendum)
In severe range. Likely from rebound hypertension from missing her clonidine in the morning and likely prior doses. Clonidine 0.3mg  given in clinic.  Patient reported a headache but has had a similar one prior. She denies any symptoms suggesting end organ damage such as double vision, altered mental status, edema, chest pain or shortness of breath. Will check CMP for kidney function and hepatic function. Will have her follow up closely in clinic in 2 days. Reviewed red flags for return to care: chest pain, worsening headache, double vision, weakness. Patient expressed understanding and agreed with plan.  Discussed case with Dr. Jennette Kettle who agreed with above plan.

## 2013-07-12 NOTE — Assessment & Plan Note (Signed)
Uncontrolled depression which has likely been affecting her compliance with medications. In addition, stressors like lack of water and heat are compounding problem.  Increase celexa to 40mg  daily. Will contact Normal Wilson with CSW to see about resources available.

## 2013-07-12 NOTE — Assessment & Plan Note (Signed)
Better controled with A1C of 6.9. With reported hypoglycemia with novolog, stop novolog and continue with lantus

## 2013-07-12 NOTE — Assessment & Plan Note (Signed)
No evidence of sinusitis. Nasal atrovent for congestion symptoms.

## 2013-07-12 NOTE — Patient Instructions (Signed)
Follow up with me in 1 week for the blood pressure.   Make appointment with Dr. Raymondo Band for the 24hr blood pressure monitoring.   For the depression, I am increasing your celexa to 40mg .   For the congestion, I am sending a nasal spray to use.

## 2013-07-14 ENCOUNTER — Encounter: Payer: Self-pay | Admitting: Clinical

## 2013-07-14 ENCOUNTER — Encounter: Payer: Self-pay | Admitting: Family Medicine

## 2013-07-14 ENCOUNTER — Ambulatory Visit (INDEPENDENT_AMBULATORY_CARE_PROVIDER_SITE_OTHER): Payer: Medicare HMO | Admitting: Family Medicine

## 2013-07-14 VITALS — BP 138/87 | HR 66 | Temp 98.6°F | Ht 62.0 in | Wt 275.0 lb

## 2013-07-14 DIAGNOSIS — X58XXXD Exposure to other specified factors, subsequent encounter: Secondary | ICD-10-CM

## 2013-07-14 DIAGNOSIS — IMO0001 Reserved for inherently not codable concepts without codable children: Secondary | ICD-10-CM

## 2013-07-14 DIAGNOSIS — X58XXXA Exposure to other specified factors, initial encounter: Secondary | ICD-10-CM

## 2013-07-14 DIAGNOSIS — E1165 Type 2 diabetes mellitus with hyperglycemia: Secondary | ICD-10-CM

## 2013-07-14 DIAGNOSIS — E785 Hyperlipidemia, unspecified: Secondary | ICD-10-CM

## 2013-07-14 DIAGNOSIS — I10 Essential (primary) hypertension: Secondary | ICD-10-CM

## 2013-07-14 DIAGNOSIS — T731XXD Deprivation of water, subsequent encounter: Secondary | ICD-10-CM

## 2013-07-14 LAB — LDL CHOLESTEROL, DIRECT: Direct LDL: 71 mg/dL

## 2013-07-14 MED ORDER — CLONIDINE HCL 0.3 MG PO TABS: 0.3000 mg | ORAL_TABLET | Freq: Two times a day (BID) | ORAL | Status: DC

## 2013-07-14 NOTE — Patient Instructions (Signed)
Please follow up with me in 1 month. Make an appointment with the pharmacy clinic for 24hr blood pressure monitoring with Dr. Raymondo Band in the next 2 weeks.

## 2013-07-14 NOTE — Patient Instructions (Signed)
Clinical Child psychotherapist (CSW) met with pt as she is unable to pay her water bill. CSW asked what resources pt has already explored. Pt stated she has already gone to [employer] who informed her that they do not have any more funding at this time. Pt stated she has also gone to [employer] however they do not assist with water bills. CSW explored whether pt has any income to pay twd the bill however pt stated she does not at this time however her daughter will be able to assist next week when she gets paid. Pt stated she does not like to ask ppl for help. CSW encouraged pt to contact the water company and speak to a supervisor who maybe willing to turn the water back on with some payment. CSW also encouraged pt to ask her church and to also contact [employer] who might be able to assist. CSW also provided the number to the [facility] as they may have other resources. Pt appreciative and confirmed that she, her daughter and grandson have a place to stay if their water is not turned back on. No additional concerns.  Theresia Bough, MSW, LCSW [phone]

## 2013-07-15 DIAGNOSIS — Z659 Problem related to unspecified psychosocial circumstances: Secondary | ICD-10-CM | POA: Insufficient documentation

## 2013-07-15 NOTE — Progress Notes (Signed)
Patient ID: [name]    DOB: [birth year], 57 y.o.   MRN: [id number] --- Subjective:  [name] is a 57 y.o.female who presents for follow up on blood pressure.  - since her visit on 07/12/13, she has been taking her medication as follows: takes her lisinopril, metoprolol/hctz, amlodipine as scheduled. She took 2 tablets of the clonidine 0.2mg  on the night of her office visit. Yesterday, she took 1 in the morning and 2 at night. She checked her BP at home and it has been 142/76. She denies any chest pain, shortness of breath, lower extremity edema, double vision. She continues to have a slight occipital headache which is unchanged from her chronic headache pattern.   - social stressors: she had reported to [place] at her last visit that she did not have any running water. Since then, it has been hooked back up illegally. She does have space heaters and hot water. She has a couple of daughters whose houses she could go to. She has seen the [employer] and told that funds are tight.   ROS: see HPI Past Medical History: reviewed and updated medications and allergies. Social History: Tobacco: none  Objective: Filed Vitals:   07/14/13 1410  BP: 138/87  Pulse: 66  Temp: 98.6 F (37 C)    Physical Examination:   General appearance - alert, well appearing, and in no distress, more serene appearing than at previous visit Chest - clear to auscultation, no wheezes, rales or rhonchi, symmetric air entry Heart - normal rate, regular rhythm, normal S1, S2, no murmurs, rubs, clicks or gallops Extremities - no pedal edema

## 2013-07-15 NOTE — Assessment & Plan Note (Signed)
Much improved today after patient has started taking her medications again.  - plan to increase the clonidine to 0.3mg  bid. She will finish her 0.2mg  supply first. Advised her not to take 2 tablets of 0.2mg  at once, as this is too much - 24hr BP monitoring for better understanding of BP pattern throughout the day and possible adjustment of medications - follow up in 1 month with me - CMP reviewed and normal without evidence of end organ damage in context of SBP>200 on 07/11/13

## 2013-07-15 NOTE — Assessment & Plan Note (Signed)
Lack of running water creating social stressor that is impairing her medical care.  [name], clinic's CSW, spent extensive time with her in the office reviewing options. She advised patient to call the water company and explain the situation to see if a payment plan can be arranged.  Patient does have water currently and also has heat, which is reassuring especially as the temperatures start to drop during this winter season.

## 2013-08-28 ENCOUNTER — Encounter (HOSPITAL_COMMUNITY): Payer: Self-pay | Admitting: Emergency Medicine

## 2013-08-28 ENCOUNTER — Emergency Department (HOSPITAL_COMMUNITY)
Admission: EM | Admit: 2013-08-28 | Discharge: 2013-08-28 | Disposition: A | Discharge status: Home / Self Care | Payer: Medicare HMO | Attending: Emergency Medicine | Admitting: Emergency Medicine

## 2013-08-28 DIAGNOSIS — E119 Type 2 diabetes mellitus without complications: Secondary | ICD-10-CM | POA: Insufficient documentation

## 2013-08-28 DIAGNOSIS — R519 Headache, unspecified: Secondary | ICD-10-CM

## 2013-08-28 DIAGNOSIS — Z79899 Other long term (current) drug therapy: Secondary | ICD-10-CM | POA: Insufficient documentation

## 2013-08-28 DIAGNOSIS — R51 Headache: Secondary | ICD-10-CM | POA: Insufficient documentation

## 2013-08-28 DIAGNOSIS — E7800 Pure hypercholesterolemia, unspecified: Secondary | ICD-10-CM | POA: Insufficient documentation

## 2013-08-28 DIAGNOSIS — Z8673 Personal history of transient ischemic attack (TIA), and cerebral infarction without residual deficits: Secondary | ICD-10-CM | POA: Insufficient documentation

## 2013-08-28 DIAGNOSIS — I4891 Unspecified atrial fibrillation: Secondary | ICD-10-CM | POA: Insufficient documentation

## 2013-08-28 DIAGNOSIS — Z794 Long term (current) use of insulin: Secondary | ICD-10-CM | POA: Insufficient documentation

## 2013-08-28 DIAGNOSIS — I10 Essential (primary) hypertension: Secondary | ICD-10-CM | POA: Insufficient documentation

## 2013-08-28 DIAGNOSIS — Z7901 Long term (current) use of anticoagulants: Secondary | ICD-10-CM | POA: Insufficient documentation

## 2013-08-28 MED ORDER — FENTANYL CITRATE 0.05 MG/ML IJ SOLN: 50.0000 ug | Freq: Once | INTRAMUSCULAR | Status: DC

## 2013-08-28 MED ORDER — METOCLOPRAMIDE HCL 10 MG PO TABS: 10.0000 mg | ORAL_TABLET | Freq: Four times a day (QID) | ORAL | Status: DC | PRN

## 2013-08-28 MED ORDER — METOCLOPRAMIDE HCL 5 MG/ML IJ SOLN
10.0000 mg | Freq: Once | INTRAMUSCULAR | Status: AC
  Administered 2013-08-28: 10 mg via INTRAMUSCULAR
  Filled 2013-08-28: qty 2

## 2013-08-28 MED ORDER — DIPHENHYDRAMINE HCL 25 MG PO CAPS
50.0000 mg | ORAL_CAPSULE | Freq: Once | ORAL | Status: AC
  Administered 2013-08-28: 50 mg via ORAL
  Filled 2013-08-28: qty 2

## 2013-08-28 MED ORDER — PREDNISONE 20 MG PO TABS
60.0000 mg | ORAL_TABLET | Freq: Once | ORAL | Status: AC
  Administered 2013-08-28: 60 mg via ORAL
  Filled 2013-08-28: qty 3

## 2013-08-28 MED ORDER — OXYCODONE-ACETAMINOPHEN 5-325 MG PO TABS: 2.0000 | ORAL_TABLET | Freq: Four times a day (QID) | ORAL | Status: DC | PRN

## 2013-08-28 MED ORDER — HYDROMORPHONE HCL PF 1 MG/ML IJ SOLN
2.0000 mg | Freq: Once | INTRAMUSCULAR | Status: AC
  Administered 2013-08-28: 2 mg via INTRAMUSCULAR
  Filled 2013-08-28: qty 2

## 2013-08-28 NOTE — ED Provider Notes (Signed)
CSN: [id number]     Arrival date & time 08/28/13  1023 History   First MD Initiated Contact with Patient 08/28/13 1043     Chief Complaint  Patient presents with  . Headache  . Hypertension   (Consider location/radiation/quality/duration/timing/severity/associated sxs/prior Treatment) HPI 58 year old female gradual onset over the last 3 days of generalized throbbing headache started out mild has gradually become moderately severe like prior headaches, however her headaches typically don't last as many days as this one has, but no trauma no sudden headache with no change in speech or vision or swallowing or understanding as well as no new focal or lateralizing weakness numbness or incoordination, at baseline she has chronic stable low back pain and chronic stable weakness in both legs which are unchanged, she walks with a cane at baseline and is able to walk today at baseline with her cane, she has no fever no rash no chest pain no shortness breath no abdominal pain no nausea or vomiting and she has had no treatment prior to arrival. Past Medical History  Diagnosis Date  . Hypertension   . Hypertension     Hypertensive urgency 05/2006  . Atrial fibrillation   . Hypercholesteremia   . 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 Diabetes mellitus     type 2   Past Surgical History  Procedure Laterality Date  . Tonsilectomy, adenoidectomy, bilateral myringotomy and tubes  age 72  . Dilation and curettage of uterus  1976  . Meniscus repair  03/09    right knee  . Tonsillectomy     Family History  Problem Relation Age of Onset  . Heart failure Mother   . Diabetes Father   . Hypertension Father   . Lung cancer Sister    History  Substance Use Topics  . Smoking status: Never Smoker   . Smokeless tobacco: Never Used  . Alcohol Use: No   OB History   Grav Para Term Preterm Abortions TAB SAB Ect Mult Living                 Review of Systems 10 Systems  reviewed and are negative for acute change except as noted in the HPI. Allergies  Propoxyphene n-acetaminophen  Home Medications   Current Outpatient Rx  Name  Route  Sig  Dispense  Refill  . amLODipine (NORVASC) 10 MG tablet   Oral   Take 1 tablet (10 mg total) by mouth daily.   30 tablet   3   . celecoxib (CELEBREX) 200 MG capsule   Oral   Take 1 capsule (200 mg total) by mouth daily.   30 capsule   0   . citalopram (CELEXA) 40 MG tablet   Oral   Take 1 tablet (40 mg total) by mouth daily.   30 tablet   3   . cloNIDine (CATAPRES) 0.3 MG tablet   Oral   Take 0.6 mg by mouth at bedtime.         . clopidogrel (PLAVIX) 75 MG tablet   Oral   Take 75 mg by mouth daily with breakfast.          . glucose blood test strip   Other   1 each by Other route 4 (four) times daily - after meals and at bedtime. Use as instructed   100 each   12   . insulin aspart (NOVOLOG) 100 UNIT/ML injection   Subcutaneous   Inject 20  Units into the skin 3 (three) times daily.          . insulin glargine (LANTUS) 100 UNIT/ML injection   Subcutaneous   Inject 55 Units into the skin at bedtime.    10 mL   0   . Insulin Pen Needle (B-D ULTRAFINE III SHORT PEN) 31G X 8 MM MISC   Does not apply   1 Container by Does not apply route 4 (four) times daily. Dispense QS for 4 shots daily (1 lantus and 3 novolog)   1 each   prn   . lisinopril (PRINIVIL,ZESTRIL) 40 MG tablet   Oral   Take 1 tablet (40 mg total) by mouth daily.   30 tablet   6   . metFORMIN (GLUCOPHAGE) 1000 MG tablet   Oral   Take 1 tablet (1,000 mg total) by mouth 2 (two) times daily with a meal.   60 tablet   6     Refill [id number]   . metoprolol-hydrochlorothiazide (LOPRESSOR HCT) 100-25 MG per tablet   Oral   Take 1 tablet by mouth daily.   90 tablet   3   . nystatin (MYCOSTATIN/NYSTOP) 100000 UNIT/GM POWD   Topical   Apply 1 g topically 3 (three) times daily.          Marland Kitchen omeprazole (PRILOSEC) 20 MG  capsule   Oral   Take 1 capsule (20 mg total) by mouth at bedtime.   30 capsule   2   . rosuvastatin (CRESTOR) 10 MG tablet   Oral   Take 1 tablet (10 mg total) by mouth at bedtime.   90 tablet   3   . metoCLOPramide (REGLAN) 10 MG tablet   Oral   Take 1 tablet (10 mg total) by mouth every 6 (six) hours as needed for nausea (nausea/headache).   6 tablet   0   . oxyCODONE-acetaminophen (PERCOCET) 5-325 MG per tablet   Oral   Take 2 tablets by mouth every 6 (six) hours as needed.   6 tablet   0    BP 156/85  Pulse 73  Temp(Src) 98.1 F (36.7 C) (Oral)  Resp 14  Ht 5\' 2"  (1.575 m)  Wt 277 lb (125.646 kg)  BMI 50.65 kg/m2  SpO2 96% Physical Exam  Nursing note and vitals reviewed. Constitutional:  Awake, alert, nontoxic appearance with baseline speech for patient.  HENT:  Head: Atraumatic.  Mouth/Throat: No oropharyngeal exudate.  Eyes: EOM are normal. Pupils are equal, round, and reactive to light. Right eye exhibits no discharge. Left eye exhibits no discharge.  Neck: Neck supple.  Cardiovascular: Normal rate and regular rhythm.   No murmur heard. Pulmonary/Chest: Effort normal and breath sounds normal. No stridor. No respiratory distress. She has no wheezes. She has no rales. She exhibits no tenderness.  Abdominal: Soft. Bowel sounds are normal. She exhibits no mass. There is no tenderness. There is no rebound.  Musculoskeletal: She exhibits no tenderness.  Baseline ROM, moves extremities with no obvious new focal weakness.  Lymphadenopathy:    She has no cervical adenopathy.  Neurological:  Awake, alert, cooperative and aware of situation; motor strength bilaterally; sensation normal to light touch bilaterally; peripheral visual fields full to confrontation; no facial asymmetry; tongue midline; major cranial nerves appear intact; no pronator drift, normal finger to nose bilaterally, baseline gait without new ataxia.  Skin: No rash noted.  Psychiatric: She has a  normal mood and affect.    ED Course  Procedures (including critical  care time) Pt feels improved after observation and/or treatment in ED.Patient informed of clinical course, understand medical decision-making process, and agree with plan. Labs Review Labs Reviewed - No data to display Imaging Review No results found.  EKG Interpretation   None       MDM   1. Headache    I doubt any other EMC precluding discharge at this time including, but not necessarily limited to the following:SAH, CVA, SBI.    Babette Relic, MD 08/28/13 2133

## 2013-08-28 NOTE — ED Notes (Signed)
Pt undressed, in gown, on bp cuff and oximetry

## 2013-08-28 NOTE — Discharge Instructions (Signed)

## 2013-08-28 NOTE — ED Notes (Signed)
Pt here from home with c/o h/a and htn , pt state sthat her head has been hurting for 3 days , pt states that she took her meds last nigth

## 2013-09-06 ENCOUNTER — Encounter: Payer: Self-pay | Admitting: Family Medicine

## 2013-09-06 ENCOUNTER — Ambulatory Visit (INDEPENDENT_AMBULATORY_CARE_PROVIDER_SITE_OTHER): Payer: Medicare HMO | Admitting: Family Medicine

## 2013-09-06 VITALS — BP 161/82 | HR 76 | Temp 97.7°F | Ht 62.0 in | Wt 278.0 lb

## 2013-09-06 DIAGNOSIS — S46819A Strain of other muscles, fascia and tendons at shoulder and upper arm level, unspecified arm, initial encounter: Secondary | ICD-10-CM

## 2013-09-06 DIAGNOSIS — F3289 Other specified depressive episodes: Secondary | ICD-10-CM

## 2013-09-06 DIAGNOSIS — F329 Major depressive disorder, single episode, unspecified: Secondary | ICD-10-CM

## 2013-09-06 DIAGNOSIS — S43499A Other sprain of unspecified shoulder joint, initial encounter: Secondary | ICD-10-CM

## 2013-09-06 DIAGNOSIS — Z23 Encounter for immunization: Secondary | ICD-10-CM

## 2013-09-06 DIAGNOSIS — M62838 Other muscle spasm: Secondary | ICD-10-CM

## 2013-09-06 DIAGNOSIS — R51 Headache: Secondary | ICD-10-CM

## 2013-09-06 DIAGNOSIS — I10 Essential (primary) hypertension: Secondary | ICD-10-CM

## 2013-09-06 DIAGNOSIS — F32A Depression, unspecified: Secondary | ICD-10-CM

## 2013-09-06 MED ORDER — CYCLOBENZAPRINE HCL 10 MG PO TABS: 10.0000 mg | ORAL_TABLET | Freq: Three times a day (TID) | ORAL | Status: DC | PRN

## 2013-09-06 MED ORDER — CLONIDINE HCL 0.2 MG PO TABS: 0.2000 mg | ORAL_TABLET | Freq: Two times a day (BID) | ORAL | Status: DC

## 2013-09-06 MED ORDER — CITALOPRAM HYDROBROMIDE 40 MG PO TABS: 40.0000 mg | ORAL_TABLET | Freq: Every day | ORAL | Status: DC

## 2013-09-06 NOTE — Patient Instructions (Signed)
For the blood pressure, please make an appointment with the pharmacy clinic for 24hr monitoring.   For the shoulder pain, I think you pulled a muscle. Take the flexeril at night time and use hot pads on the area.   I am going to set you up for a sleep study to check you out for sleep apnea.   For the headache, I am going to refer you to a neurologist.

## 2013-09-06 NOTE — Progress Notes (Signed)
Patient ID: [name]    DOB: 05/26/1956, 58 y.o.   MRN: [id number] --- Subjective:  [name] is a 58 y.o.female who presents for follow up. Her concerns include the following:   # headache:  Has had headaches since a TIA in 2010. She had been seen for her headaches at the headache center. She has not been there in over a year. She reports continued headaches. Piercing, moving from one place to the other around her head. Lasts 2-3 days when she starts having one. Not associated with photophobia or phonophobia. Not associated with nausea or vomiting. Reports some blurred vision which has been ongoing for several weeks. She reports getting headaches in the early morning.  She denies any worsening headache with valsalva.  She was seen in the ED on 08/28/13 and was given a migraine cocktail which helped. She did not get any head imaging at the time.   # Hypertension: Medications: clonidine 0.4mg  at night, amlodipine 10mg  daily, metop/hctz 100/25, lisinopril 40mg  daily Number of doses missed in 1 week: 0 BP at home: doesn't check Exercise routine: none Salt in diet: present Chest pain: none Lower extremity swelling: none Shortness of breath: none Headache: yes, see above Change in vision: yes, see above Other: patient reports daytime sleepiness, snoring. Family members have also noticed her not breathing while sleeping  # right sided mid back pain:  Started 2 nights ago. Right shoulder blade aches with twisting and turbing. Doesn't remember any trauma or change in activity. Rates pain as a 6/10.   ROS: see HPI Past Medical History: reviewed and updated medications and allergies. Social History: Tobacco: none  Objective: Filed Vitals:   09/06/13 0915  BP: 161/82  Pulse: 76  Temp: 97.7 F (36.5 C)    Physical Examination:   General appearance - alert, well appearing, and in no distress Neuro - CN2-12 grossly intact, decreased upper visual field bilaterally, fundoscopy exam  attempted but not clearly visualized.  Mouth - mucous membranes moist, pharynx normal without lesions Neck - supple, no significant adenopathy Chest - clear to auscultation, no wheezes, rales or rhonchi, symmetric air entry Heart - normal rate, regular rhythm, normal S1, S2, no murmurs Extremities - trace pedal edema Back - no spinal tenderness, tenderness along right trapezius muscle.    Last BMP and lipid panel:  Lipid Panel     Component Value Date/Time   CHOL 124 07/08/2012 0550   TRIG 105 07/08/2012 0550   HDL 38* 07/08/2012 0550   CHOLHDL 3.3 07/08/2012 0550   VLDL 21 07/08/2012 0550   LDLCALC 65 07/08/2012 0550   CMP     Component Value Date/Time   NA 143 07/12/2013 1610   K 4.2 07/12/2013 1610   CL 107 07/12/2013 1610   CO2 28 07/12/2013 1610   GLUCOSE 94 07/12/2013 1610   BUN 13 07/12/2013 1610   CREATININE 0.91 07/12/2013 1610   CREATININE 0.90 07/10/2013 1100   CALCIUM 9.6 07/12/2013 1610   PROT 6.9 07/12/2013 1610   ALBUMIN 4.0 07/12/2013 1610   AST 18 07/12/2013 1610   ALT 18 07/12/2013 1610   ALKPHOS 89 07/12/2013 1610   BILITOT 0.3 07/12/2013 1610   GFRNONAA 69* 07/10/2013 1100   GFRAA 80* 07/10/2013 1100

## 2013-09-07 DIAGNOSIS — S46819A Strain of other muscles, fascia and tendons at shoulder and upper arm level, unspecified arm, initial encounter: Secondary | ICD-10-CM | POA: Insufficient documentation

## 2013-09-07 NOTE — Assessment & Plan Note (Addendum)
Not at goal.  - 24hr BP monitoring and appointment with pharmacy clinic - sleep study order placed to evaluate for sleep apnea as possible contributor to elevated BP  - continue current regimen for now until further evaluation.

## 2013-09-07 NOTE — Assessment & Plan Note (Signed)
Ongoing problem for which patient was seen at the headache clinic but has not been to in over a year. Uncontrolled BP is likely playing a role and I would like for patient to get 24hr BP monitoring for better control. Also, with report of morning headaches and somnolence during the day and snoring, sleep apnea could also be playing a role. Will place sleep study referral.  Will place neurology referral for evaluation since this has been an ongoing issue and would likely benefit from new evaluation.  Will also place referral for ophthalmology in setting of change in vision that she has been reporting.

## 2013-09-07 NOTE — Assessment & Plan Note (Addendum)
Right shoulder blade pain likely from trapezius strain.  - flexeril qhs/prn and hot pads

## 2013-09-15 ENCOUNTER — Ambulatory Visit (INDEPENDENT_AMBULATORY_CARE_PROVIDER_SITE_OTHER): Payer: Medicare HMO | Admitting: Pharmacist

## 2013-09-15 ENCOUNTER — Encounter: Payer: Self-pay | Admitting: Pharmacist

## 2013-09-15 VITALS — BP 170/98 | HR 60 | Wt 278.9 lb

## 2013-09-15 DIAGNOSIS — IMO0001 Reserved for inherently not codable concepts without codable children: Secondary | ICD-10-CM

## 2013-09-15 DIAGNOSIS — I10 Essential (primary) hypertension: Secondary | ICD-10-CM

## 2013-09-15 DIAGNOSIS — IMO0002 Reserved for concepts with insufficient information to code with codable children: Secondary | ICD-10-CM

## 2013-09-15 DIAGNOSIS — E1165 Type 2 diabetes mellitus with hyperglycemia: Secondary | ICD-10-CM

## 2013-09-15 MED ORDER — CARVEDILOL 25 MG PO TABS: 25.0000 mg | ORAL_TABLET | Freq: Two times a day (BID) | ORAL | Status: DC

## 2013-09-15 MED ORDER — CHLORTHALIDONE 25 MG PO TABS: 25.0000 mg | ORAL_TABLET | Freq: Every day | ORAL | Status: DC

## 2013-09-15 MED ORDER — INSULIN ASPART 100 UNIT/ML ~~LOC~~ SOLN: 20.0000 [IU] | Freq: Three times a day (TID) | SUBCUTANEOUS | Status: DC

## 2013-09-15 NOTE — Progress Notes (Signed)
S:    Patient arrives in good spirits.    She presents to the clinic for assistance with blood pressure management.   Medication compliance is reported to be good HOWEVER, patient takes all medications total daily dose AT bedtime.  She admits to taking clonidine 0.4mg  QHS due to feeling fatigues when she takes clonidine during the day.  She also takes metformin 2000mg  at bedtime.   She reports nocturia of 5-6 times per night.  This has been occurring despite her recent CBGs to be in good control < 200.    Current BP Medications include:  Metoprolol HCTZ combination AND Clonidine.   O:  BP 170/98  Pulse 60  Wt 278 lb 14.4 oz (126.508 kg)   BMET    Component Value Date/Time   NA 143 07/12/2013 1610   K 4.2 07/12/2013 1610   CL 107 07/12/2013 1610   CO2 28 07/12/2013 1610   GLUCOSE 94 07/12/2013 1610   BUN 13 07/12/2013 1610   CREATININE 0.91 07/12/2013 1610   CREATININE 0.90 07/10/2013 1100   CALCIUM 9.6 07/12/2013 1610   GFRNONAA 69* 07/10/2013 1100   GFRAA 80* 07/10/2013 1100    A/P: History of hypertension currently poorly controlled most likely related to suboptimal regimen AND suboptimal time of medications.   Patient is willing to take medications shortly after she awakens at 11:00 or 11:30 on a typical day. STOP metoprolol AND HCTZ combination pill.  Start Chlorthalidone 25mg  once daily 12:00 Noon Clonidine 1/2 pill at 12:00 noon AND take 1 full pill at bedtime.  Carvedilol 25mg  one pill at 12:00 noon AND 1 pill at bedtime.   Additionally, for diabetes - asked to take metformin 1000mg  tablet 1 pill at 12 noon AND 1 pill at bedtime and to check blood sugars more often.  Plan to review meter at next visit.   Results reviewed and written information provided.   F/U Clinic Visit 2/26.  Total time in face-to-face counseling 35 minutes.

## 2013-09-15 NOTE — Assessment & Plan Note (Signed)
History of hypertension currently poorly controlled most likely related to suboptimal regimen AND suboptimal time of medications.   Patient is willing to take medications shortly after she awakens at 11:00 or 11:30 on a typical day. STOP metoprolol AND HCTZ combination pill.  Start Chlorthalidone 25mg  once daily 12:00 Noon Clonidine 1/2 pill at 12:00 noon AND take 1 full pill at bedtime.  Carvedilol 25mg  one pill at 12:00 noon AND 1 pill at bedtime.

## 2013-09-15 NOTE — Progress Notes (Signed)
Patient ID: [name], female   DOB: 08-Jan-1956, 59 y.o.   MRN: [id number] Reviewed: Agree with Dr. Graylin Shiver documentation and management.

## 2013-09-15 NOTE — Assessment & Plan Note (Signed)
  diabetes improved secondary to improved diet. Asked to take metformin 1000mg  tablet 1 pill at 12 noon AND 1 pill at bedtime and to check blood sugars more often.  Plan to review meter at next visit.   Results reviewed and written information provided.   F/U Clinic Visit 2/26.  Total time in face-to-face counseling 35 minutes.

## 2013-09-15 NOTE — Patient Instructions (Addendum)
Great to see you today.   Keep doing good work on your exercise and diet plan.   Blood Pressure management needs some work.   Today we will STOP metoprolol AND HCTZ combination pill.   Start new regimen when you pick them up.   Chlorthalidone 25mg  once daily 12:00 Noon Clonidine 1/2 pill at 12:00 noon AND take 1 full pill at bedtime.  Carvedilol 25mg  one pill at 12:00 noon AND 1 pill at bedtime.   For you diabetes  Take you metformin 1000mg  tablet 1 pill at 12 noon AND 1 pill at bedtime.  Please check your blood sugars more often  Bring your meter AND all your medicines   Restart you aspirin daily.

## 2013-09-29 ENCOUNTER — Ambulatory Visit: Payer: Medicare HMO | Admitting: Pharmacist

## 2013-10-04 ENCOUNTER — Ambulatory Visit (HOSPITAL_BASED_OUTPATIENT_CLINIC_OR_DEPARTMENT_OTHER): Payer: Medicare HMO | Attending: Family Medicine

## 2013-10-04 VITALS — Ht 62.0 in | Wt 278.0 lb

## 2013-10-04 DIAGNOSIS — I10 Essential (primary) hypertension: Secondary | ICD-10-CM

## 2013-10-04 DIAGNOSIS — G4733 Obstructive sleep apnea (adult) (pediatric): Secondary | ICD-10-CM | POA: Insufficient documentation

## 2013-10-06 ENCOUNTER — Ambulatory Visit: Payer: Medicare HMO | Admitting: Pharmacist

## 2013-10-08 DIAGNOSIS — G4733 Obstructive sleep apnea (adult) (pediatric): Secondary | ICD-10-CM

## 2013-10-08 NOTE — Sleep Study (Signed)
   NAME: [name] DATE OF BIRTH:  [birth year] MEDICAL RECORD NUMBER [id number]  LOCATION: [facility]  PHYSICIAN: YOUNG,CLINTON D  DATE OF STUDY: 10/04/2013  SLEEP STUDY TYPE: Nocturnal Polysomnogram               REFERRING PHYSICIAN: Kandis Nab, MD  INDICATION FOR STUDY: Hypersomnia with sleep apnea  EPWORTH SLEEPINESS SCORE:   9/24 HEIGHT: 5\' 2"  (157.5 cm)  WEIGHT: 278 lb (126.1 kg)    Body mass index is 50.83 kg/(m^2).  NECK SIZE: 16 in.  MEDICATIONS: Charted for review  SLEEP ARCHITECTURE: Total sleep time 282 minutes with sleep efficiency 77.6%. Stage I was 11.9%, stage II 60.1%, stage III 7.4%, REM 20.6% of total sleep time. Sleep latency 27.5 minutes, REM latency 162.5 minutes, awake after sleep onset 54.5 minutes, arousal index 25.1. Bedtime medication: Aspirin  RESPIRATORY DATA: Apnea hypopnea index (AHI) 10.9 per hour. 51 total events scored, all as hypopneas. Events were seen in all sleep positions. REM AHI 23.8 per hour. There were not enough early events to meet protocol requirement for CPAP titration.  OXYGEN DATA: Moderately loud snoring with oxygen desaturation to a nadir of 79% and mean oxygen saturation through the study of 93.1% on room air  CARDIAC DATA: Normal sinus rhythm  MOVEMENT/PARASOMNIA: No significant movement disturbance, bathroom x2  IMPRESSION/ RECOMMENDATION:   1) Mild obstructive sleep apnea/hypopneas syndrome, AHI 10.9 per hour with non-positional events. REM AHI 23.8 per hour. Moderately loud snoring with oxygen desaturation to a nadir of 79% and mean oxygen saturation through the study of 93.1% on room air.  2) There were not enough early events to meet protocol requirements for CPAP titration. If conservative measures such as weight loss and encouragement to sleep off flat of back are not sufficient, this patient could return for dedicated CPAP titration study if appropriate.  Signed Baird Lyons  [name] Deneise Lever Diplomate, American Board of Sleep Medicine  ELECTRONICALLY SIGNED ON:  10/08/2013, 11:05 AM [place] PH: [phone]   FX: [phone] [place]

## 2013-10-31 ENCOUNTER — Other Ambulatory Visit: Payer: Self-pay | Admitting: *Deleted

## 2013-10-31 MED ORDER — OMEPRAZOLE 20 MG PO CPDR: 20.0000 mg | DELAYED_RELEASE_CAPSULE | Freq: Every day | ORAL | Status: DC

## 2013-10-31 MED ORDER — METFORMIN HCL 1000 MG PO TABS: 1000.0000 mg | ORAL_TABLET | Freq: Two times a day (BID) | ORAL | Status: DC

## 2013-11-15 ENCOUNTER — Telehealth: Payer: Self-pay | Admitting: *Deleted

## 2013-11-15 NOTE — Telephone Encounter (Signed)
Received message from Rx line from [place] regarding possible drug to drug interaction.  Per representative pt is on Omeprazole 20 mg and citalopram 40 mg.  The omeprazole may increase the levels of the citalopram.  Citalopram is not recommended to be dosed above 20 mg a day with the combination of omeprazole.  Please give [facility] a call [phone] to clarify if dosage of citalopram.  Derl Barrow, RN

## 2013-11-16 ENCOUNTER — Telehealth: Payer: Self-pay | Admitting: *Deleted

## 2013-11-16 NOTE — Telephone Encounter (Signed)
Kennyth Lose (pharmacy) called. Re: interaction between Plavix and Prilosec. Please call her back. (may change to Protonix, no interaction) Will fwd to Dr.Losq for review and to address. [place]

## 2013-11-16 NOTE — Telephone Encounter (Signed)
Please let pharmacy know that we can change it to protonix 40mg  at night time.  Thank you!  Liam Graham, PGY-3 Family Medicine Resident

## 2013-11-16 NOTE — Telephone Encounter (Signed)
Called [place] and informed. [name]

## 2013-11-16 NOTE — Telephone Encounter (Signed)
Called patient to check whether she is taking the omeprazole and she is indeed taking it. Therefore told her that we would decrease the celexa to 20mg  daily from celexa 40mg  daily.   Called [facility] to decrease celexa to 20mg  daily.   Liam Graham, PGY-3 Family Medicine Resident

## 2013-11-17 ENCOUNTER — Ambulatory Visit (INDEPENDENT_AMBULATORY_CARE_PROVIDER_SITE_OTHER): Payer: Medicare HMO | Admitting: Family Medicine

## 2013-11-17 ENCOUNTER — Encounter: Payer: Self-pay | Admitting: Family Medicine

## 2013-11-17 VITALS — BP 160/76 | HR 68 | Temp 97.9°F | Ht 62.0 in | Wt 271.0 lb

## 2013-11-17 DIAGNOSIS — G4700 Insomnia, unspecified: Secondary | ICD-10-CM

## 2013-11-17 DIAGNOSIS — H532 Diplopia: Secondary | ICD-10-CM

## 2013-11-17 DIAGNOSIS — R51 Headache: Secondary | ICD-10-CM

## 2013-11-17 DIAGNOSIS — G4733 Obstructive sleep apnea (adult) (pediatric): Secondary | ICD-10-CM

## 2013-11-17 MED ORDER — TRAZODONE HCL 50 MG PO TABS: 50.0000 mg | ORAL_TABLET | Freq: Every evening | ORAL | Status: DC | PRN

## 2013-11-17 NOTE — Patient Instructions (Signed)
Let's get you seen by ophthalmology.  We'll also get you set up for the sleep study. We will call you for that.

## 2013-11-18 ENCOUNTER — Telehealth: Payer: Self-pay | Admitting: *Deleted

## 2013-11-18 ENCOUNTER — Ambulatory Visit (INDEPENDENT_AMBULATORY_CARE_PROVIDER_SITE_OTHER): Payer: Medicare HMO | Admitting: Pharmacist

## 2013-11-18 ENCOUNTER — Encounter: Payer: Self-pay | Admitting: Pharmacist

## 2013-11-18 ENCOUNTER — Telehealth: Payer: Self-pay | Admitting: Family Medicine

## 2013-11-18 VITALS — BP 169/98 | HR 73 | Ht 63.5 in | Wt 269.3 lb

## 2013-11-18 DIAGNOSIS — IMO0001 Reserved for inherently not codable concepts without codable children: Secondary | ICD-10-CM

## 2013-11-18 DIAGNOSIS — E1165 Type 2 diabetes mellitus with hyperglycemia: Secondary | ICD-10-CM

## 2013-11-18 DIAGNOSIS — E669 Obesity, unspecified: Secondary | ICD-10-CM

## 2013-11-18 DIAGNOSIS — G4733 Obstructive sleep apnea (adult) (pediatric): Secondary | ICD-10-CM | POA: Insufficient documentation

## 2013-11-18 DIAGNOSIS — G4700 Insomnia, unspecified: Secondary | ICD-10-CM | POA: Insufficient documentation

## 2013-11-18 DIAGNOSIS — F329 Major depressive disorder, single episode, unspecified: Secondary | ICD-10-CM

## 2013-11-18 DIAGNOSIS — IMO0002 Reserved for concepts with insufficient information to code with codable children: Secondary | ICD-10-CM

## 2013-11-18 DIAGNOSIS — F3289 Other specified depressive episodes: Secondary | ICD-10-CM

## 2013-11-18 DIAGNOSIS — I10 Essential (primary) hypertension: Secondary | ICD-10-CM

## 2013-11-18 MED ORDER — SERTRALINE HCL 50 MG PO TABS: 50.0000 mg | ORAL_TABLET | Freq: Every day | ORAL | Status: DC

## 2013-11-18 MED ORDER — GLUCOSE BLOOD VI STRP: 1.0000 | ORAL_STRIP | Freq: Three times a day (TID) | Status: DC

## 2013-11-18 MED ORDER — SPIRONOLACTONE 25 MG PO TABS: 12.5000 mg | ORAL_TABLET | Freq: Every day | ORAL | Status: DC

## 2013-11-18 MED ORDER — INSULIN PEN NEEDLE 31G X 8 MM MISC: 1.0000 | Freq: Four times a day (QID) | Status: DC

## 2013-11-18 NOTE — Assessment & Plan Note (Signed)
Ongoing problem. With reported change in vision, have entered urgent ophthalmology referral. Had done this before but unclear why it did not go through.  Otherwise no red flags on exam warranting head imaging at this time.  BP although not optimal is better controled than prior.  Monitor with close follow up. If acutely worst, will obtain CT or MRI

## 2013-11-18 NOTE — Assessment & Plan Note (Signed)
Depression: Patient was confused after receiving a call about a drug interaction between omeprazole and Celexa 40mg  and stopped her Celexa completely instead of decreasing to 20mg . D/C Celexa and initiated sertraline 50mg  daily.  Plan to titrate this medication to effect.

## 2013-11-18 NOTE — Progress Notes (Signed)
S:    Patient arrives feeling stressed but in good spirits. Presents for diabetes and hypertension management. Medication compliance is reported to be fair. Patient did not recognize chlorthalidone during the med rec, but may be taking this correctly at noon.   Current BP Medications include:  Amlodipine, carvedilol, chlorthalidone?, clonidine, lisinopril  Antihypertensives tried in the past include: metoprolol-HCTZ combination   O:  . Lab Results  Component Value Date   HGBA1C 6.9 07/12/2013    home fasting CBG readings of 100-200  Last 3 Office BP readings: 160/76 mmHg 170/98 mmHg 161/82 mmHg  Today's Office BP reading: 169/98 mmHg   A/P: Diabetes: currently improved and patient satisfied with current level of control. Denies hypoglycemic events and is able to verbalize appropriate hypoglycemia management plan.  Reports adherence with medication. Patient is meeting goals for blood glucose control at this time. No change at this time.  Hypertension: BP slightly improved at 169/98, but still uncontrolled. Initiated spironolactone 12.5 mg daily. BMET in 1 week.   Weight loss: Patient would like to be around 170 lbs although she described this goal as unrealistic. Established a short term weight loss goal of <260 lbs by July 1st (2.5 months).  Depression: Patient was confused after receiving a call about a drug interaction between omeprazole and Celexa 40mg  and stopped her Celexa completely instead of decreasing to 20mg . D/C Celexa and initiated sertraline 50mg  daily.  Plan to titrate this medication to effect.   Written patient instructions provided.  Follow up in Pharmacist Clinic Visit next week.   Total time in face to face counseling 45 minutes.  Patient seen with Toribio Harbour, PharmD Candidate,  Silas [place],  PharmD Candidate.

## 2013-11-18 NOTE — Assessment & Plan Note (Signed)
Hypertension: BP slightly improved at 169/98, but still uncontrolled. Initiated spironolactone 12.5 mg daily. BMET in 1 week.

## 2013-11-18 NOTE — Assessment & Plan Note (Signed)
Sleep hygiene discussed at length with patient; no TV, no lights, no books.  Will also try trazodone for sleep.

## 2013-11-18 NOTE — Assessment & Plan Note (Signed)
Weight loss: Patient would like to be around 170 lbs although she described this goal as unrealistic. Established a short term weight loss goal of <260 lbs by July 1st (2.5 months).

## 2013-11-18 NOTE — Telephone Encounter (Signed)
Informed patient of the corrections to be made.  Patient to comply

## 2013-11-18 NOTE — Assessment & Plan Note (Signed)
Patient has lost some weight but continues to have symptoms of daytime sleepiness.  - will obtain split sleep study for titration for CPAP.

## 2013-11-18 NOTE — Telephone Encounter (Signed)
Call pt and LVM .   Pt's INS are not right!  Medicaid is under another address  [employer] the provider is not correct.  In order to process this referral pt needs to do the corrections.  [place]

## 2013-11-18 NOTE — Patient Instructions (Addendum)
Check your medication bottles at home to see if you are taking the chlorthalidone at your noon dose. We have sent prescriptions to your pharmacy for spironolactone, sertraline, test strips, and needles. Come back next week for a visit with [name] for blood work. Bring all of your medicine bottles to your next visit.

## 2013-11-18 NOTE — Progress Notes (Signed)
Patient ID: SIYA FLURRY    DOB: [birth year], 58 y.o.   MRN: [id number] --- Subjective:  [name] is a 58 y.o.female who presents for follow up. Her concerns include the following:  - difficulty sleeping and fatigue during the day: difficulty falling asleep. Has the TV and the light on. She nods during the day. She wakes up around 4:30am and gets 3-4 hours of sleep per night.  She had a sleep study done which showed mild sleep apnea.   - headaches: persistent headaches that occur in different locations, back of head as well as temporal. Ongoing for several months, although patient has a history of chronic headaches for which she had seen the headache center. Pain is dull in nature. Wakes up with a headache. She reports some double vision when reading small letters, not otherwise.   ROS: see HPI Past Medical History: reviewed and updated medications and allergies. Social History: Tobacco: none  Objective: Filed Vitals:   11/17/13 1417  BP: 160/76  Pulse: 68  Temp: 97.9 F (36.6 C)    Physical Examination:   General appearance - alert,  and in no distress, tired appearing, obese.  Mouth - mucous membranes moist, pharynx normal without lesions Neuro - CN2-12 grossly intact, peripheral vision present but decreased in the lower quadrants bilaterally, EOMI  Chest - clear to auscultation, no wheezes, rales or rhonchi, symmetric air entry Heart - normal rate, regular rhythm, normal S1, S2, no murmurs

## 2013-11-18 NOTE — Assessment & Plan Note (Signed)
Diabetes: currently improved and patient satisfied with current level of control. Denies hypoglycemic events and is able to verbalize appropriate hypoglycemia management plan.  Reports adherence with medication. Patient is meeting goals for blood glucose control at this time. No change at this time.

## 2013-11-18 NOTE — Telephone Encounter (Signed)
Received fax message from [facility] regarding one touch ultra blue. Please re fax prescription with specific strips to dispense. Which meter does the patient use to test blood sugars? Please phone [phone] or Fax [phone]. Derl Barrow, RN

## 2013-11-18 NOTE — Progress Notes (Signed)
Patient ID: [name], female   DOB: 03/31/1956, 57 y.o.   MRN: [id number] Reviewed: Agree with Dr. Koval's documentation and management. 

## 2013-11-21 NOTE — Telephone Encounter (Signed)
Ultrablue VI and 3x daily testing with PRN refills

## 2013-11-24 ENCOUNTER — Encounter: Payer: Self-pay | Admitting: Pharmacist

## 2013-11-24 ENCOUNTER — Ambulatory Visit (INDEPENDENT_AMBULATORY_CARE_PROVIDER_SITE_OTHER): Payer: Medicare HMO | Admitting: Pharmacist

## 2013-11-24 VITALS — BP 146/88 | HR 73 | Ht 63.0 in | Wt 273.9 lb

## 2013-11-24 DIAGNOSIS — I10 Essential (primary) hypertension: Secondary | ICD-10-CM

## 2013-11-24 DIAGNOSIS — E1165 Type 2 diabetes mellitus with hyperglycemia: Secondary | ICD-10-CM

## 2013-11-24 DIAGNOSIS — M62838 Other muscle spasm: Secondary | ICD-10-CM

## 2013-11-24 DIAGNOSIS — IMO0002 Reserved for concepts with insufficient information to code with codable children: Secondary | ICD-10-CM

## 2013-11-24 DIAGNOSIS — IMO0001 Reserved for inherently not codable concepts without codable children: Secondary | ICD-10-CM

## 2013-11-24 LAB — BASIC METABOLIC PANEL
BUN: 12 mg/dL (ref 6–23)
CO2: 26 mEq/L (ref 19–32)
CREATININE: 0.77 mg/dL (ref 0.50–1.10)
Calcium: 9.3 mg/dL (ref 8.4–10.5)
Chloride: 102 mEq/L (ref 96–112)
Glucose, Bld: 212 mg/dL — ABNORMAL HIGH (ref 70–99)
Potassium: 3.9 mEq/L (ref 3.5–5.3)
Sodium: 139 mEq/L (ref 135–145)

## 2013-11-24 MED ORDER — SPIRONOLACTONE 25 MG PO TABS: 25.0000 mg | ORAL_TABLET | Freq: Every day | ORAL | Status: DC

## 2013-11-24 MED ORDER — TRAZODONE HCL 50 MG PO TABS: 50.0000 mg | ORAL_TABLET | Freq: Every evening | ORAL | Status: DC | PRN

## 2013-11-24 MED ORDER — CYCLOBENZAPRINE HCL 10 MG PO TABS: 10.0000 mg | ORAL_TABLET | Freq: Three times a day (TID) | ORAL | Status: DC | PRN

## 2013-11-24 NOTE — Assessment & Plan Note (Signed)
Hypertension: Patient found to have isolated systolic hypertension.  Changes to medications at this time include D/C metoprolol-HCTZ, taken med and sent for destruction, and increase spironolactone to 25mg  daily. New prescription provided for higher dose. Follow-up results of today's BMET.

## 2013-11-24 NOTE — Assessment & Plan Note (Signed)
Diabetes: Currently improved and patient satisfied with current level of control.   Denies hypoglycemic events and is able to verbalize appropriate hypoglycemia management plan.  Reports adherence with medication. Patient is meeting goals for blood glucose control at this time. No changes to DM medications at this time.

## 2013-11-24 NOTE — Patient Instructions (Addendum)
Thank you for visiting [place] today.   Stop taking the omeprazole capsules and start the pantoprazole tablets. Stop taking your metoprolol-HCTZ. Increase your spironolactone to 1 tablet daily. Prescriptions for spironolactone, trazodone, and Flexeril have been sent to [facility] on [address].   [place] job with your insulin and blood sugar levels. Keep up the good work!

## 2013-11-24 NOTE — Progress Notes (Signed)
S:    Patient arrives in good spirits with her grandson, Phillips Odor. She presents to the clinic for blood pressure and diabetes management. Diagnosed with Hypertension in the year of 2008, diagnosed with diabetes in 2010. Medication compliance is reported to be fair. She had been taking metoprolol-HCTZ although it was an old prescription that was not previously noted on her medication list. She also reported taking omeprazole instead of her new pantoprazole prescription that was given to her at her last visit. Patient reports high levels of stress lately.    Current BP Medications include: lisinopril, metoprolol-HCTZ, amlodipine, carvedilol, chlorthalidone, clonidine, spironolactone  Current DM Medications include: Novolog 10 units BID, Lantus 55 units at bedtime, metformin 1000 mg BID   O:  Last 3 Office BP readings: 169/98 mmHg 160/76 mmHg 170/98 mmHg  Today's Office BP reading: 176/73 mmHg (automatic reading), repeat BP 146/88 mmHg (automatic reading)  Non-hypertensive ABPM thresholds: daytime BP <135/85 mmHg, sleeptime BP <120/70 mmHg NICE Hypertension Guidelines ([place]) using ABPM: Stage I: >135/85 mmHg, Stage 2: >150/95 mmHg)   BMET    Component Value Date/Time   NA 143 07/12/2013 1610   K 4.2 07/12/2013 1610   CL 107 07/12/2013 1610   CO2 28 07/12/2013 1610   GLUCOSE 94 07/12/2013 1610   BUN 13 07/12/2013 1610   CREATININE 0.91 07/12/2013 1610   CREATININE 0.90 07/10/2013 1100   CALCIUM 9.6 07/12/2013 1610   GFRNONAA 69* 07/10/2013 1100   GFRAA 80* 07/10/2013 1100   . Lab Results  Component Value Date   HGBA1C 6.9 07/12/2013    home fasting CBG readings of 110-150 (138 this AM)  2 hour post-prandial/random CBG readings of around 170 (usually takes these readings at 8pm).  A/P: Hypertension: Patient found to have isolated systolic hypertension.  Changes to medications at this time include D/C metoprolol-HCTZ, taken med and sent for destruction, and increase spironolactone to 25mg   daily. New prescription provided for higher dose. Follow-up results of today's BMET.    A/P: Diabetes: Currently improved and patient satisfied with current level of control.   Denies hypoglycemic events and is able to verbalize appropriate hypoglycemia management plan.  Reports adherence with medication. Patient is meeting goals for blood glucose control at this time. No changes to DM medications at this time.  Refilled trazodone, Flexeril   Written patient instructions provided.  Follow up in Pharmacist Clinic Visit in 1 month.   Total time in face to face counseling 45 minutes.  Patient seen with Toribio Harbour, PharmD Candidate,  Silas [place],  PharmD Candidate.

## 2013-11-25 NOTE — Progress Notes (Signed)
Patient ID: [name], female   DOB: Nov 06, 1955, 58 y.o.   MRN: [id number] Reviewed: Agree with Dr. Graylin Shiver documentation and management.

## 2013-12-12 ENCOUNTER — Ambulatory Visit (HOSPITAL_BASED_OUTPATIENT_CLINIC_OR_DEPARTMENT_OTHER): Payer: Medicare HMO | Attending: Family Medicine

## 2013-12-12 VITALS — Ht 62.0 in | Wt 264.0 lb

## 2013-12-12 DIAGNOSIS — G4710 Hypersomnia, unspecified: Secondary | ICD-10-CM | POA: Insufficient documentation

## 2013-12-12 DIAGNOSIS — G4730 Sleep apnea, unspecified: Principal | ICD-10-CM

## 2013-12-12 DIAGNOSIS — G4733 Obstructive sleep apnea (adult) (pediatric): Secondary | ICD-10-CM

## 2013-12-17 NOTE — Sleep Study (Signed)
   NAME: [name] DATE OF BIRTH:  May 30, 1956 MEDICAL RECORD NUMBER [id number]  LOCATION: [facility]  PHYSICIAN: Clinton D Young  DATE OF STUDY: 12/12/2013  SLEEP STUDY TYPE: Nocturnal Polysomnogram               REFERRING PHYSICIAN: Kandis Nab, MD  INDICATION FOR STUDY: Hypersomnia with sleep apnea-CPAP titration  EPWORTH SLEEPINESS SCORE:   10/24 HEIGHT: 5\' 2"  (157.5 cm)  WEIGHT: 264 lb (119.75 kg)    Body mass index is 48.27 kg/(m^2).  NECK SIZE: 16.5 in.  MEDICATIONS: Charted for review  SLEEP ARCHITECTURE: Total sleep time 235 minutes with sleep efficiency 62.3%. Stage I was 8.1%, stage II 77.4%, stage III 0.2%, REM 14.3% of total sleep time. Sleep latency 135.5 minutes, awake after sleep onset 8.5 minutes, arousal index 12, bedtime medication: None. Difficulty initiating sleep was noted with sleep onset at 1 AM.  RESPIRATORY DATA: CPAP titration protocol. CPAP was titrated to 9 CWP, AHI 1.1 per hour. She wore a medium Eson nasal mask with heated humidifier.  OXYGEN DATA: Snoring was prevented on final CPAP with mean oxygen saturation on CPAP 95.1% on room air  CARDIAC DATA: Normal sinus rhythm  MOVEMENT/PARASOMNIA: No significant movement disturbance, no bathroom trips  IMPRESSION/ RECOMMENDATION:   1) Successful CPAP titration to 9 CWP, AHI 1.1 per hour. She wore a medium [employer] Eson nasal mask with heated humidifier. Snoring was prevented and mean oxygen saturation held 95.1% on room air 2) Baseline diagnostic polysomnogram on 10/04/2013 recorded AHI 10.9 per hour with body weight 278 pounds.  Signed Baird Lyons [name] [place], [employer] of Sleep Medicine  ELECTRONICALLY SIGNED ON:  12/17/2013, 10:44 AM [place] PH: [phone]   FX: [phone] [place]

## 2013-12-18 ENCOUNTER — Encounter (HOSPITAL_COMMUNITY): Payer: Self-pay | Admitting: Emergency Medicine

## 2013-12-18 ENCOUNTER — Emergency Department (HOSPITAL_COMMUNITY): Payer: Medicare HMO

## 2013-12-18 ENCOUNTER — Emergency Department (HOSPITAL_COMMUNITY)
Admission: EM | Admit: 2013-12-18 | Discharge: 2013-12-19 | Disposition: A | Discharge status: Home / Self Care | Payer: Medicare HMO | Attending: Emergency Medicine | Admitting: Emergency Medicine

## 2013-12-18 DIAGNOSIS — I10 Essential (primary) hypertension: Secondary | ICD-10-CM

## 2013-12-18 DIAGNOSIS — Z7902 Long term (current) use of antithrombotics/antiplatelets: Secondary | ICD-10-CM | POA: Insufficient documentation

## 2013-12-18 DIAGNOSIS — Z7982 Long term (current) use of aspirin: Secondary | ICD-10-CM | POA: Insufficient documentation

## 2013-12-18 DIAGNOSIS — Z79899 Other long term (current) drug therapy: Secondary | ICD-10-CM | POA: Insufficient documentation

## 2013-12-18 DIAGNOSIS — E119 Type 2 diabetes mellitus without complications: Secondary | ICD-10-CM | POA: Insufficient documentation

## 2013-12-18 DIAGNOSIS — Z8673 Personal history of transient ischemic attack (TIA), and cerebral infarction without residual deficits: Secondary | ICD-10-CM | POA: Insufficient documentation

## 2013-12-18 DIAGNOSIS — Z791 Long term (current) use of non-steroidal anti-inflammatories (NSAID): Secondary | ICD-10-CM | POA: Insufficient documentation

## 2013-12-18 DIAGNOSIS — R51 Headache: Secondary | ICD-10-CM | POA: Insufficient documentation

## 2013-12-18 DIAGNOSIS — I4891 Unspecified atrial fibrillation: Secondary | ICD-10-CM | POA: Insufficient documentation

## 2013-12-18 DIAGNOSIS — R519 Headache, unspecified: Secondary | ICD-10-CM

## 2013-12-18 DIAGNOSIS — E7800 Pure hypercholesterolemia, unspecified: Secondary | ICD-10-CM | POA: Insufficient documentation

## 2013-12-18 DIAGNOSIS — Z794 Long term (current) use of insulin: Secondary | ICD-10-CM | POA: Insufficient documentation

## 2013-12-18 LAB — COMPREHENSIVE METABOLIC PANEL
ALT: 19 U/L (ref 0–35)
AST: 23 U/L (ref 0–37)
Albumin: 3.6 g/dL (ref 3.5–5.2)
Alkaline Phosphatase: 105 U/L (ref 39–117)
BUN: 11 mg/dL (ref 6–23)
CHLORIDE: 102 meq/L (ref 96–112)
CO2: 24 mEq/L (ref 19–32)
Calcium: 9.4 mg/dL (ref 8.4–10.5)
Creatinine, Ser: 0.78 mg/dL (ref 0.50–1.10)
GFR calc non Af Amer: >90 mL/min (ref >90)
GLUCOSE: 193 mg/dL — AB (ref 70–99)
Potassium: 4.2 mEq/L (ref 3.7–5.3)
Sodium: 139 mEq/L (ref 137–147)
Total Protein: 7.6 g/dL (ref 6.0–8.3)

## 2013-12-18 LAB — CBC WITH DIFFERENTIAL/PLATELET
Basophils Absolute: 0 10*3/uL (ref 0.0–0.1)
Basophils Relative: 0 % (ref 0–1)
EOS ABS: 0.1 10*3/uL (ref 0.0–0.7)
Eosinophils Relative: 1 % (ref 0–5)
HEMATOCRIT: 37.9 % (ref 36.0–46.0)
HEMOGLOBIN: 12.4 g/dL (ref 12.0–15.0)
LYMPHS ABS: 2.8 10*3/uL (ref 0.7–4.0)
Lymphocytes Relative: 43 % (ref 12–46)
MCH: 23.1 pg — AB (ref 26.0–34.0)
MCHC: 32.7 g/dL (ref 30.0–36.0)
MCV: 70.6 fL — ABNORMAL LOW (ref 78.0–100.0)
MONO ABS: 0.4 10*3/uL (ref 0.1–1.0)
MONOS PCT: 6 % (ref 3–12)
NEUTROS ABS: 3.2 10*3/uL (ref 1.7–7.7)
NEUTROS PCT: 50 % (ref 43–77)
Platelets: 291 10*3/uL (ref 150–400)
RBC: 5.37 MIL/uL — AB (ref 3.87–5.11)
RDW: 15.5 % (ref 11.5–15.5)
WBC: 6.4 10*3/uL (ref 4.0–10.5)

## 2013-12-18 LAB — TROPONIN I

## 2013-12-18 MED ORDER — CLONIDINE HCL 0.2 MG PO TABS
0.2000 mg | ORAL_TABLET | Freq: Once | ORAL | Status: AC
  Administered 2013-12-18: 0.2 mg via ORAL
  Filled 2013-12-18: qty 1

## 2013-12-18 MED ORDER — LISINOPRIL 20 MG PO TABS
40.0000 mg | ORAL_TABLET | Freq: Once | ORAL | Status: AC
  Administered 2013-12-18: 40 mg via ORAL
  Filled 2013-12-18: qty 2

## 2013-12-18 MED ORDER — METOCLOPRAMIDE HCL 5 MG/ML IJ SOLN
10.0000 mg | Freq: Once | INTRAMUSCULAR | Status: AC
  Administered 2013-12-19: 10 mg via INTRAVENOUS
  Filled 2013-12-18: qty 2

## 2013-12-18 MED ORDER — TETRACAINE HCL 0.5 % OP SOLN
2.0000 [drp] | Freq: Once | OPHTHALMIC | Status: AC
  Administered 2013-12-19: 2 [drp] via OPHTHALMIC
  Filled 2013-12-18: qty 2

## 2013-12-18 MED ORDER — AMLODIPINE BESYLATE 10 MG PO TABS: 10.0000 mg | ORAL_TABLET | Freq: Every day | ORAL | Status: DC

## 2013-12-18 MED ORDER — DIPHENHYDRAMINE HCL 50 MG/ML IJ SOLN
25.0000 mg | Freq: Once | INTRAMUSCULAR | Status: AC
  Administered 2013-12-19: 25 mg via INTRAVENOUS
  Filled 2013-12-18: qty 1

## 2013-12-18 MED ORDER — KETOROLAC TROMETHAMINE 30 MG/ML IJ SOLN
30.0000 mg | Freq: Once | INTRAMUSCULAR | Status: AC
  Administered 2013-12-19: 30 mg via INTRAVENOUS
  Filled 2013-12-18: qty 1

## 2013-12-18 NOTE — ED Notes (Addendum)
Pt presents with HTN, pt woke 0600 this am with a "severe left side headache" wit increase pain behind her Left eye. Pt reports a hx of HTN and is currently taking Norvasc and Clonidine for several years, pt states her PCP just added an additional HTN medication but pt is unable to remember the name.  Pt denies chest pain but does report "flutters every now and then."

## 2013-12-18 NOTE — ED Notes (Signed)
Pt reports left sided HA since 0600 today that is progressively worsening. Pt denies anything makes better/worse. Reports nausea. Denies taking anything for pain. NAD. Ambulatory to triage. Neuro intact. Pt noted to be hypertensive in triage 219/118.

## 2013-12-18 NOTE — ED Notes (Signed)
Patient transported to CT 

## 2013-12-18 NOTE — ED Provider Notes (Signed)
CSN: [id number]     Arrival date & time 12/18/13  1957 History   First MD Initiated Contact with Patient 12/18/13 2210     Chief Complaint  Patient presents with  . Headache  . Hypertension     (Consider location/radiation/quality/duration/timing/severity/associated sxs/prior Treatment) HPI Comments: Patient presents with left-sided headache that woke her from sleep at 6 AM it has triggered progressively worsening throughout the day. She denies taking medication for it. She denies any chest pain or shortness of breath. She endorses nausea and photophobia. History of similar headaches in the past. She did not take her blood pressure medication this evening. She denies any fever, chills, focal weakness, numbness or tingling. She denies any vision change and has double vision at baseline from previous stroke. She states her PCP for her blood pressure medications but she has been compliant.  The history is provided by the patient.    Past Medical History  Diagnosis Date  . Hypertension   . Hypertension     Hypertensive urgency 05/2006  . Atrial fibrillation   . Hypercholesteremia   . 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 Diabetes mellitus     type 2   Past Surgical History  Procedure Laterality Date  . Tonsilectomy, adenoidectomy, bilateral myringotomy and tubes  age 44  . Dilation and curettage of uterus  1976  . Meniscus repair  03/09    right knee  . Tonsillectomy     Family History  Problem Relation Age of Onset  . Heart failure Mother   . Diabetes Father   . Hypertension Father   . Lung cancer Sister    History  Substance Use Topics  . Smoking status: Never Smoker   . Smokeless tobacco: Never Used  . Alcohol Use: No   OB History   Grav Para Term Preterm Abortions TAB SAB Ect Mult Living                 Review of Systems  Constitutional: Negative for fever, activity change and appetite change.  Eyes: Positive for photophobia.  Negative for visual disturbance.  Respiratory: Negative for cough, chest tightness and shortness of breath.   Cardiovascular: Negative for chest pain and leg swelling.  Gastrointestinal: Negative for nausea, vomiting and abdominal pain.  Genitourinary: Negative for dysuria, hematuria, vaginal bleeding and vaginal discharge.  Musculoskeletal: Negative for arthralgias, back pain and myalgias.  Neurological: Positive for headaches. Negative for dizziness, weakness, light-headedness and numbness.  A complete 10 system review of systems was obtained and all systems are negative except as noted in the HPI and PMH.      Allergies  Propoxyphene n-acetaminophen  Home Medications   Prior to Admission medications   Medication Sig Start Date End Date Taking? Authorizing Provider  amLODipine (NORVASC) 10 MG tablet Take 1 tablet (10 mg total) by mouth daily. 02/16/13   Kandis Nab, MD  aspirin 81 MG tablet Take 81 mg by mouth daily.    Historical Provider, MD  carvedilol (COREG) 25 MG tablet Take 1 tablet (25 mg total) by mouth 2 (two) times daily with a meal. 09/15/13   Zigmund Gottron, MD  celecoxib (CELEBREX) 200 MG capsule Take 1 capsule (200 mg total) by mouth daily. 02/03/13   Kandis Nab, MD  chlorthalidone (HYGROTON) 25 MG tablet Take 1 tablet (25 mg total) by mouth daily. Take at 12:00 noon daily 09/15/13   Zigmund Gottron, MD  cloNIDine (  CATAPRES) 0.2 MG tablet Take 1/2 tablet in the AM (12:00 noon) AND take 1 tablet at bedtime (10PM) 09/15/13   Zigmund Gottron, MD  clopidogrel (PLAVIX) 75 MG tablet Take 75 mg by mouth daily with breakfast.     Historical Provider, MD  cyclobenzaprine (FLEXERIL) 10 MG tablet Take 1 tablet (10 mg total) by mouth 3 (three) times daily as needed for muscle spasms. 11/24/13   Zigmund Gottron, MD  glucose blood test strip 1 each by Other route 4 (four) times daily - after meals and at bedtime. Use as instructed 11/18/13   Zigmund Gottron, MD  insulin aspart (NOVOLOG) 100 UNIT/ML injection Inject 10 Units into the skin 2 (two) times daily. 09/15/13   Zigmund Gottron, MD  insulin glargine (LANTUS) 100 UNIT/ML injection Inject 55 Units into the skin at bedtime.  02/07/13   Zigmund Gottron, MD  Insulin Pen Needle (B-D ULTRAFINE III SHORT PEN) 31G X 8 MM MISC 1 Container by Does not apply route 4 (four) times daily. Dispense QS for 4 shots daily (1 lantus and 3 novolog) 11/18/13   Zigmund Gottron, MD  lisinopril (PRINIVIL,ZESTRIL) 40 MG tablet Take 1 tablet (40 mg total) by mouth daily. 11/26/12   [place], DO  metFORMIN (GLUCOPHAGE) 1000 MG tablet Take 1 tablet (1,000 mg total) by mouth 2 (two) times daily with a meal. 10/31/13   Kandis Nab, MD  nystatin (MYCOSTATIN/NYSTOP) 100000 UNIT/GM POWD Apply 1 g topically 3 (three) times daily.     Historical Provider, MD  pantoprazole (PROTONIX) 40 MG tablet Take 40 mg by mouth at bedtime.    Historical Provider, MD  rosuvastatin (CRESTOR) 10 MG tablet Take 1 tablet (10 mg total) by mouth at bedtime. 03/25/13   Kandis Nab, MD  sertraline (ZOLOFT) 50 MG tablet Take 1 tablet (50 mg total) by mouth daily. 11/18/13   Zigmund Gottron, MD  spironolactone (ALDACTONE) 25 MG tablet Take 1 tablet (25 mg total) by mouth daily. 11/24/13   Zigmund Gottron, MD  terbinafine (LAMISIL) 250 MG tablet Take 250 mg by mouth every other day.    Historical Provider, MD  traZODone (DESYREL) 50 MG tablet Take 1 tablet (50 mg total) by mouth at bedtime as needed for sleep. 11/24/13   Zigmund Gottron, MD   BP 152/75  Pulse 66  Temp(Src) 98.2 F (36.8 C) (Oral)  Resp 18  SpO2 91% Physical Exam  Constitutional: She is oriented to person, place, and time. She appears well-developed and well-nourished. No distress.  HENT:  Head: Normocephalic and atraumatic.  Mouth/Throat: Oropharynx is clear and moist. No oropharyngeal exudate.  No temporal artery tenderness IOP  left 18 IOP right 16  Eyes: Conjunctivae and EOM are normal. Pupils are equal, round, and reactive to light.  Neck: Normal range of motion. Neck supple.  No meningismus  Cardiovascular: Normal rate, regular rhythm and normal heart sounds.   No murmur heard. Pulmonary/Chest: Effort normal and breath sounds normal. No respiratory distress.  Abdominal: Soft. There is no tenderness. There is no rebound and no guarding.  Musculoskeletal: Normal range of motion. She exhibits no edema and no tenderness.  Neurological: She is alert and oriented to person, place, and time. No cranial nerve deficit. She exhibits normal muscle tone. Coordination normal.  CN 2-12 intact, no ataxia on finger to nose, no nystagmus, 5/5 strength throughout, no pronator drift, Romberg negative, normal gait.   Skin: Skin is warm.  ED Course  Procedures (including critical care time) Labs Review Labs Reviewed  CBC WITH DIFFERENTIAL - Abnormal; Notable for the following:    RBC 5.37 (*)    MCV 70.6 (*)    MCH 23.1 (*)    All other components within normal limits  COMPREHENSIVE METABOLIC PANEL - Abnormal; Notable for the following:    Glucose, Bld 193 (*)    Total Bilirubin <0.2 (*)    All other components within normal limits  SEDIMENTATION RATE - Abnormal; Notable for the following:    Sed Rate 24 (*)    All other components within normal limits  TROPONIN I    Imaging Review Ct Head Wo Contrast  12/19/2013   CLINICAL DATA:  Headache.  EXAM: CT HEAD WITHOUT CONTRAST  TECHNIQUE: Contiguous axial images were obtained from the base of the skull through the vertex without intravenous contrast.  COMPARISON:  Head CT 07/07/2012.  FINDINGS: Patchy and confluent areas of decreased attenuation are noted throughout the deep and periventricular white matter of the cerebral hemispheres bilaterally, compatible with chronic microvascular ischemic disease. No acute intracranial abnormalities. Specifically, no evidence of acute  intracranial hemorrhage, no definite findings of acute/subacute cerebral ischemia, no mass, mass effect, hydrocephalus or abnormal intra or extra-axial fluid collections. Visualized paranasal sinuses and mastoids are well pneumatized. No acute displaced skull fractures are identified.  IMPRESSION: 1. No acute intracranial abnormalities. 2. Chronic microvascular ischemic changes in the cerebral white matter redemonstrated, as above.   Electronically Signed   By: Vinnie Langton [name]   On: 12/19/2013 00:40     EKG Interpretation None      MDM   Final diagnoses:  Headache  Hypertension   Gradual onset headache in setting of hypertension. No focal weakness, numbness or tingling. No thunderclap onset. No fever. No visual change other than double vision that she has a baseline.  We'll give blood pressure medications, obtain CT head. Neurologically intact.  CT head negative. Neuro exam nonfocal. Blood pressure has improved to 160/90 after medications.  Labs at baseline. No evidence of end organ damage. IOP normal. ESR 24.  Doubt SAH, meningitis, temporal arteritis, CVA. Headache relieved with treatment in the ED and BP improved. Advised to keep BP log and follow up with PCP this week. Return precautions discussed.  BP 152/75  Pulse 66  Temp(Src) 98.2 F (36.8 C) (Oral)  Resp 18  SpO2 91%   Ezequiel Essex, MD 12/19/13 1122

## 2013-12-18 NOTE — ED Notes (Signed)
Dr. Wyvonnia Dusky at Bedside.

## 2013-12-19 ENCOUNTER — Other Ambulatory Visit: Payer: Self-pay | Admitting: *Deleted

## 2013-12-19 DIAGNOSIS — E119 Type 2 diabetes mellitus without complications: Secondary | ICD-10-CM

## 2013-12-19 LAB — SEDIMENTATION RATE: Sed Rate: 24 mm/hr — ABNORMAL HIGH (ref 0–22)

## 2013-12-19 MED ORDER — HYDRALAZINE HCL 20 MG/ML IJ SOLN
10.0000 mg | Freq: Once | INTRAMUSCULAR | Status: DC
  Filled 2013-12-19: qty 1

## 2013-12-19 MED ORDER — INSULIN GLARGINE 100 UNIT/ML ~~LOC~~ SOLN: 55.0000 [IU] | Freq: Every day | SUBCUTANEOUS | Status: DC

## 2013-12-19 NOTE — Discharge Instructions (Signed)
Arterial Hypertension Keep a log of your blood pressures and follow up with your doctor this week. Return to the ED if you develop new or worsening symptoms. Arterial hypertension (high blood pressure) is a condition of elevated pressure in your blood vessels. Hypertension over a long period of time is a risk factor for strokes, heart attacks, and heart failure. It is also the leading cause of kidney (renal) failure.  CAUSES   In Adults -- Over 90% of all hypertension has no known cause. This is called essential or primary hypertension. In the other 10% of people with hypertension, the increase in blood pressure is caused by another disorder. This is called secondary hypertension. Important causes of secondary hypertension are:  Heavy alcohol use.  Obstructive sleep apnea.  Hyperaldosterosim (Conn's syndrome).  Steroid use.  Chronic kidney failure.  Hyperparathyroidism.  Medications.  Renal artery stenosis.  Pheochromocytoma.  Cushing's disease.  Coarctation of the aorta.  Scleroderma renal crisis.  Licorice (in excessive amounts).  Drugs (cocaine, methamphetamine). Your caregiver can explain any items above that apply to you.  In Children -- Secondary hypertension is more common and should always be considered.  Pregnancy -- Few women of childbearing age have high blood pressure. However, up to 10% of them develop hypertension of pregnancy. Generally, this will not harm the woman. It may be a sign of 3 complications of pregnancy: preeclampsia, HELLP syndrome, and eclampsia. Follow up and control with medication is necessary. SYMPTOMS   This condition normally does not produce any noticeable symptoms. It is usually found during a routine exam.  Malignant hypertension is a late problem of high blood pressure. It may have the following symptoms:  Headaches.  Blurred vision.  End-organ damage (this means your kidneys, heart, lungs, and other organs are being  damaged).  Stressful situations can increase the blood pressure. If a person with normal blood pressure has their blood pressure go up while being seen by their caregiver, this is often termed "white coat hypertension." Its importance is not known. It may be related with eventually developing hypertension or complications of hypertension.  Hypertension is often confused with mental tension, stress, and anxiety. DIAGNOSIS  The diagnosis is made by 3 separate blood pressure measurements. They are taken at least 1 week apart from each other. If there is organ damage from hypertension, the diagnosis may be made without repeat measurements. Hypertension is usually identified by having blood pressure readings:  Above 140/90 mmHg measured in both arms, at 3 separate times, over a couple weeks.  Over 130/80 mmHg should be considered a risk factor and may require treatment in patients with diabetes. Blood pressure readings over 120/80 mmHg are called "pre-hypertension" even in non-diabetic patients. To get a true blood pressure measurement, use the following guidelines. Be aware of the factors that can alter blood pressure readings.  Take measurements at least 1 hour after caffeine.  Take measurements 30 minutes after smoking and without any stress. This is another reason to quit smoking  it raises your blood pressure.  Use a proper cuff size. Ask your caregiver if you are not sure about your cuff size.  Most home blood pressure cuffs are automatic. They will measure systolic and diastolic pressures. The systolic pressure is the pressure reading at the start of sounds. Diastolic pressure is the pressure at which the sounds disappear. If you are elderly, measure pressures in multiple postures. Try sitting, lying or standing.  Sit at rest for a minimum of 5 minutes before taking  measurements.  You should not be on any medications like decongestants. These are found in many cold medications.  Record  your blood pressure readings and review them with your caregiver. If you have hypertension:  Your caregiver may do tests to be sure you do not have secondary hypertension (see "causes" above).  Your caregiver may also look for signs of metabolic syndrome. This is also called Syndrome X or Insulin Resistance Syndrome. You may have this syndrome if you have type 2 diabetes, abdominal obesity, and abnormal blood lipids in addition to hypertension.  Your caregiver will take your medical and family history and perform a physical exam.  Diagnostic tests may include blood tests (for glucose, cholesterol, potassium, and kidney function), a urinalysis, or an EKG. Other tests may also be necessary depending on your condition. PREVENTION  There are important lifestyle issues that you can adopt to reduce your chance of developing hypertension:  Maintain a normal weight.  Limit the amount of salt (sodium) in your diet.  Exercise often.  Limit alcohol intake.  Get enough potassium in your diet. Discuss specific advice with your caregiver.  Follow a DASH diet (dietary approaches to stop hypertension). This diet is rich in fruits, vegetables, and low-fat dairy products, and avoids certain fats. PROGNOSIS  Essential hypertension cannot be cured. Lifestyle changes and medical treatment can lower blood pressure and reduce complications. The prognosis of secondary hypertension depends on the underlying cause. Many people whose hypertension is controlled with medicine or lifestyle changes can live a normal, healthy life.  RISKS AND COMPLICATIONS  While high blood pressure alone is not an illness, it often requires treatment due to its short- and long-term effects on many organs. Hypertension increases your risk for:  CVAs or strokes (cerebrovascular accident).  Heart failure due to chronically high blood pressure (hypertensive cardiomyopathy).  Heart attack (myocardial infarction).  Damage to the  retina (hypertensive retinopathy).  Kidney failure (hypertensive nephropathy). Your caregiver can explain list items above that apply to you. Treatment of hypertension can significantly reduce the risk of complications. TREATMENT   For overweight patients, weight loss and regular exercise are recommended. Physical fitness lowers blood pressure.  Mild hypertension is usually treated with diet and exercise. A diet rich in fruits and vegetables, fat-free dairy products, and foods low in fat and salt (sodium) can help lower blood pressure. Decreasing salt intake decreases blood pressure in a 1/3 of people.  Stop smoking if you are a smoker. The steps above are highly effective in reducing blood pressure. While these actions are easy to suggest, they are difficult to achieve. Most patients with moderate or severe hypertension end up requiring medications to bring their blood pressure down to a normal level. There are several classes of medications for treatment. Blood pressure pills (antihypertensives) will lower blood pressure by their different actions. Lowering the blood pressure by 10 mmHg may decrease the risk of complications by as much as 25%. The goal of treatment is effective blood pressure control. This will reduce your risk for complications. Your caregiver will help you determine the best treatment for you according to your lifestyle. What is excellent treatment for one person, may not be for you. HOME CARE INSTRUCTIONS   Do not smoke.  Follow the lifestyle changes outlined in the "Prevention" section.  If you are on medications, follow the directions carefully. Blood pressure medications must be taken as prescribed. Skipping doses reduces their benefit. It also puts you at risk for problems.  Follow up with your  caregiver, as directed.  If you are asked to monitor your blood pressure at home, follow the guidelines in the "Diagnosis" section above. SEEK MEDICAL CARE IF:   You think  you are having medication side effects.  You have recurrent headaches or lightheadedness.  You have swelling in your ankles.  You have trouble with your vision. SEEK IMMEDIATE MEDICAL CARE IF:   You have sudden onset of chest pain or pressure, difficulty breathing, or other symptoms of a heart attack.  You have a severe headache.  You have symptoms of a stroke (such as sudden weakness, difficulty speaking, difficulty walking). MAKE SURE YOU:   Understand these instructions.  Will watch your condition.  Will get help right away if you are not doing well or get worse. Document Released: 07/21/2005 Document Revised: 10/13/2011 Document Reviewed: 02/18/2007 [facility] Patient Information 2014 [place].

## 2013-12-19 NOTE — Telephone Encounter (Signed)
Request for Lantus Solostar Pen.  2nd request.  Derl Barrow, RN

## 2013-12-19 NOTE — ED Notes (Signed)
Pt discharged home with all belongings, pt alert, oriented and ambulatory upon discharge, no new RX prescribed, pt verbalizes understanding of discharge instructions, daughter is coming to pick up pt

## 2013-12-21 ENCOUNTER — Other Ambulatory Visit: Payer: Self-pay | Admitting: *Deleted

## 2013-12-21 MED ORDER — INSULIN ASPART 100 UNIT/ML ~~LOC~~ SOLN: 10.0000 [IU] | Freq: Two times a day (BID) | SUBCUTANEOUS | Status: DC

## 2013-12-22 ENCOUNTER — Telehealth: Payer: Self-pay | Admitting: *Deleted

## 2013-12-22 MED ORDER — INSULIN GLARGINE 100 UNIT/ML SOLOSTAR PEN: 55.0000 [IU] | PEN_INJECTOR | Freq: Every day | SUBCUTANEOUS | Status: DC

## 2013-12-22 NOTE — Telephone Encounter (Signed)
Second request for Lantus Solostar Pen.  Inject 50 Units into skin daily. Derl Barrow, RN

## 2013-12-22 NOTE — Addendum Note (Signed)
Addended by: Kandis Nab on: 12/22/2013 05:19 PM   Modules accepted: Orders

## 2013-12-22 NOTE — Telephone Encounter (Signed)
solostar pen lantus 55units sent to pharmacy.   Liam Graham, PGY-3 Family Medicine Resident

## 2014-01-12 ENCOUNTER — Ambulatory Visit: Payer: Medicare HMO | Admitting: Family Medicine

## 2014-01-24 ENCOUNTER — Ambulatory Visit (INDEPENDENT_AMBULATORY_CARE_PROVIDER_SITE_OTHER): Payer: Commercial Managed Care - HMO | Admitting: Family Medicine

## 2014-01-24 ENCOUNTER — Encounter: Payer: Self-pay | Admitting: Family Medicine

## 2014-01-24 VITALS — BP 180/104 | HR 73 | Ht 62.0 in | Wt 267.0 lb

## 2014-01-24 DIAGNOSIS — I10 Essential (primary) hypertension: Secondary | ICD-10-CM

## 2014-01-24 DIAGNOSIS — IMO0001 Reserved for inherently not codable concepts without codable children: Secondary | ICD-10-CM

## 2014-01-24 DIAGNOSIS — E1165 Type 2 diabetes mellitus with hyperglycemia: Secondary | ICD-10-CM

## 2014-01-24 DIAGNOSIS — R0609 Other forms of dyspnea: Secondary | ICD-10-CM

## 2014-01-24 DIAGNOSIS — R0600 Dyspnea, unspecified: Secondary | ICD-10-CM

## 2014-01-24 DIAGNOSIS — R0989 Other specified symptoms and signs involving the circulatory and respiratory systems: Secondary | ICD-10-CM

## 2014-01-24 DIAGNOSIS — IMO0002 Reserved for concepts with insufficient information to code with codable children: Secondary | ICD-10-CM

## 2014-01-24 DIAGNOSIS — R51 Headache: Secondary | ICD-10-CM

## 2014-01-24 DIAGNOSIS — G4733 Obstructive sleep apnea (adult) (pediatric): Secondary | ICD-10-CM

## 2014-01-24 LAB — POCT GLYCOSYLATED HEMOGLOBIN (HGB A1C): Hemoglobin A1C: 8.3

## 2014-01-24 MED ORDER — CLONIDINE HCL 0.1 MG PO TABS
0.2000 mg | ORAL_TABLET | Freq: Once | ORAL | Status: AC
  Administered 2014-01-24: 0.2 mg via ORAL

## 2014-01-24 NOTE — Assessment & Plan Note (Signed)
Uncontrolled. Suspect from not taking medications today. Explained importance of taking coreg and clonidine twice a day.  - gave clonidine 0.2mg  in clinic. Patient directed to return home and take her other BP medications and take the second clonidine and coreg in the evening.  - check CMP to check for end organ damage - follow up in 1 week.

## 2014-01-24 NOTE — Assessment & Plan Note (Signed)
Persistent and not improving.  - referral to neurology placed

## 2014-01-24 NOTE — Assessment & Plan Note (Signed)
Check echo 

## 2014-01-24 NOTE — Assessment & Plan Note (Signed)
Will place order for CPAP machine with [facility]

## 2014-01-24 NOTE — Patient Instructions (Signed)
Please take your blood pressure medicines when you get home. Do not take the clonidine at that time.  Take the clonidine and the carvedilol around 8 or 9pm tonight.   Follow up with your new doctor in 1 week to check up on the blood pressure and the heart ultrasound.

## 2014-01-24 NOTE — Progress Notes (Signed)
Patient ID: [name]    DOB: 19-Jan-1956, 58 y.o.   MRN: [id number] --- Subjective:  [name] is a 58 y.o.female with h/o DM2, HTN, chronic headaches, depression who presents for follow up on recent ED visit on 12/18/13 where she was seen for headache and hypertension.  Since then, she reports that she continues to have headaches that occur once or twice per week. Headaches change in location. They are excrutiating, occur in the morning when she wakes up. She reports one episode of blurred vision reading letters and numbers that occurred last month.  Pain is not associated with phono or photophobia. Pain is constant. Nothing helps it. No nausea, no vomiting. Similar to previous headaches but more intense.  In the ED she was given a migraine cocktail with mild relief. CT head did not show any acute findings.   - HTN: takes amlodipine, carvedilol, clonidine, spironolactone and chlorthalidone all at once once a day when she wakes up. She did not take any medicine today. LAst time she took it was yesterday. She doesn't take clonidine or coreg twice a day.  She has been having some shortness of breath with standing for prolonged periods of time. She also reports some brief palpitations with walking, that are not associated with dizziness or light headedness. She denies any chest pain. Currently has a mild headache. Denies lower extr swelling.   - OSA: had sleep study which showed mild sleep apnea. PAtient reports that after her sleep study, she felt much better rested and did not have a headache.   ROS: see HPI Past Medical History: reviewed and updated medications and allergies. Social History: Tobacco: none  Objective: Filed Vitals:   01/24/14 1338  BP: 190/110  Pulse: 73    Physical Examination:   General appearance - alert, well appearing, and in no distress Mouth - mucous membranes moist, pharynx normal without lesions Neuro - CN2-12 grossly intact, normal upper extr strength, fundoscopy  exam attempted but not well visualized.  Chest - clear to auscultation, no wheezes, rales or rhonchi, symmetric air entry Heart - normal rate, regular rhythm, normal S1, S2, no murmurs Extremities - no pedal edema

## 2014-01-25 ENCOUNTER — Ambulatory Visit: Payer: Medicare HMO | Admitting: Family Medicine

## 2014-01-25 ENCOUNTER — Telehealth: Payer: Self-pay | Admitting: *Deleted

## 2014-01-25 LAB — COMPREHENSIVE METABOLIC PANEL
ALBUMIN: 4.3 g/dL (ref 3.5–5.2)
ALK PHOS: 118 U/L — AB (ref 39–117)
ALT: 21 U/L (ref 0–35)
AST: 21 U/L (ref 0–37)
BUN: 10 mg/dL (ref 6–23)
CALCIUM: 10.2 mg/dL (ref 8.4–10.5)
CHLORIDE: 103 meq/L (ref 96–112)
CO2: 24 mEq/L (ref 19–32)
Creat: 0.79 mg/dL (ref 0.50–1.10)
Glucose, Bld: 149 mg/dL — ABNORMAL HIGH (ref 70–99)
POTASSIUM: 4 meq/L (ref 3.5–5.3)
SODIUM: 136 meq/L (ref 135–145)
TOTAL PROTEIN: 7.7 g/dL (ref 6.0–8.3)
Total Bilirubin: 0.4 mg/dL (ref 0.2–1.2)

## 2014-01-25 NOTE — Telephone Encounter (Signed)
Faxed silverback form for prior authorization for 2D Echo, once received schedule appointment at [facility] then call patient.Busick, Kevin Fenton

## 2014-01-26 NOTE — Telephone Encounter (Signed)
Called and informed patient of appointment for 2D Echo. It will be on 02/03/14 at 9 am at [facility]. Prior Josem Kaufmann #[id number].Busick, Kevin Fenton

## 2014-01-27 ENCOUNTER — Encounter: Payer: Self-pay | Admitting: Family Medicine

## 2014-01-28 ENCOUNTER — Emergency Department (HOSPITAL_COMMUNITY): Payer: Medicare HMO

## 2014-01-28 ENCOUNTER — Encounter (HOSPITAL_COMMUNITY): Payer: Self-pay | Admitting: Emergency Medicine

## 2014-01-28 ENCOUNTER — Emergency Department (HOSPITAL_COMMUNITY)
Admission: EM | Admit: 2014-01-28 | Discharge: 2014-01-28 | Disposition: A | Discharge status: Home / Self Care | Payer: Medicare HMO | Attending: Emergency Medicine | Admitting: Emergency Medicine

## 2014-01-28 DIAGNOSIS — I10 Essential (primary) hypertension: Secondary | ICD-10-CM

## 2014-01-28 DIAGNOSIS — R1110 Vomiting, unspecified: Secondary | ICD-10-CM | POA: Insufficient documentation

## 2014-01-28 DIAGNOSIS — Z8673 Personal history of transient ischemic attack (TIA), and cerebral infarction without residual deficits: Secondary | ICD-10-CM | POA: Insufficient documentation

## 2014-01-28 DIAGNOSIS — E7800 Pure hypercholesterolemia, unspecified: Secondary | ICD-10-CM | POA: Insufficient documentation

## 2014-01-28 DIAGNOSIS — I4891 Unspecified atrial fibrillation: Secondary | ICD-10-CM | POA: Insufficient documentation

## 2014-01-28 DIAGNOSIS — Z7901 Long term (current) use of anticoagulants: Secondary | ICD-10-CM | POA: Insufficient documentation

## 2014-01-28 DIAGNOSIS — G4489 Other headache syndrome: Secondary | ICD-10-CM | POA: Insufficient documentation

## 2014-01-28 DIAGNOSIS — Z7982 Long term (current) use of aspirin: Secondary | ICD-10-CM | POA: Insufficient documentation

## 2014-01-28 DIAGNOSIS — E119 Type 2 diabetes mellitus without complications: Secondary | ICD-10-CM | POA: Insufficient documentation

## 2014-01-28 DIAGNOSIS — Z79899 Other long term (current) drug therapy: Secondary | ICD-10-CM | POA: Insufficient documentation

## 2014-01-28 DIAGNOSIS — Z794 Long term (current) use of insulin: Secondary | ICD-10-CM | POA: Insufficient documentation

## 2014-01-28 DIAGNOSIS — G4459 Other complicated headache syndrome: Secondary | ICD-10-CM

## 2014-01-28 LAB — COMPREHENSIVE METABOLIC PANEL
ALT: 20 U/L (ref 0–35)
AST: 21 U/L (ref 0–37)
Albumin: 3.9 g/dL (ref 3.5–5.2)
Alkaline Phosphatase: 110 U/L (ref 39–117)
BUN: 11 mg/dL (ref 6–23)
CALCIUM: 9.6 mg/dL (ref 8.4–10.5)
CO2: 24 mEq/L (ref 19–32)
Chloride: 102 mEq/L (ref 96–112)
Creatinine, Ser: 0.79 mg/dL (ref 0.50–1.10)
GFR calc Af Amer: >90 mL/min (ref >90)
GFR calc non Af Amer: 89 mL/min — ABNORMAL LOW (ref >90)
Glucose, Bld: 164 mg/dL — ABNORMAL HIGH (ref 70–99)
POTASSIUM: 3.8 meq/L (ref 3.7–5.3)
SODIUM: 141 meq/L (ref 137–147)
TOTAL PROTEIN: 8.1 g/dL (ref 6.0–8.3)
Total Bilirubin: 0.3 mg/dL (ref 0.3–1.2)

## 2014-01-28 LAB — CBC WITH DIFFERENTIAL/PLATELET
BASOS ABS: 0 10*3/uL (ref 0.0–0.1)
Basophils Relative: 0 % (ref 0–1)
EOS ABS: 0.1 10*3/uL (ref 0.0–0.7)
EOS PCT: 1 % (ref 0–5)
HCT: 39.7 % (ref 36.0–46.0)
Hemoglobin: 12.5 g/dL (ref 12.0–15.0)
Lymphocytes Relative: 40 % (ref 12–46)
Lymphs Abs: 3.1 10*3/uL (ref 0.7–4.0)
MCH: 22.4 pg — ABNORMAL LOW (ref 26.0–34.0)
MCHC: 31.5 g/dL (ref 30.0–36.0)
MCV: 71.3 fL — AB (ref 78.0–100.0)
Monocytes Absolute: 0.5 10*3/uL (ref 0.1–1.0)
Monocytes Relative: 6 % (ref 3–12)
NEUTROS PCT: 53 % (ref 43–77)
Neutro Abs: 4.2 10*3/uL (ref 1.7–7.7)
PLATELETS: 307 10*3/uL (ref 150–400)
RBC: 5.57 MIL/uL — AB (ref 3.87–5.11)
RDW: 15.8 % — AB (ref 11.5–15.5)
WBC: 7.9 10*3/uL (ref 4.0–10.5)

## 2014-01-28 LAB — I-STAT TROPONIN, ED: Troponin i, poc: 0 ng/mL (ref 0.00–0.08)

## 2014-01-28 MED ORDER — METOCLOPRAMIDE HCL 10 MG PO TABS: 10.0000 mg | ORAL_TABLET | Freq: Four times a day (QID) | ORAL | Status: DC | PRN

## 2014-01-28 MED ORDER — CLONIDINE HCL 0.2 MG PO TABS
0.3000 mg | ORAL_TABLET | Freq: Once | ORAL | Status: AC
  Administered 2014-01-28: 0.3 mg via ORAL
  Filled 2014-01-28 (×2): qty 1

## 2014-01-28 MED ORDER — DEXAMETHASONE SODIUM PHOSPHATE 10 MG/ML IJ SOLN
4.0000 mg | Freq: Once | INTRAMUSCULAR | Status: AC
  Administered 2014-01-28: 4 mg via INTRAVENOUS
  Filled 2014-01-28: qty 1

## 2014-01-28 MED ORDER — KETOROLAC TROMETHAMINE 30 MG/ML IJ SOLN
30.0000 mg | Freq: Once | INTRAMUSCULAR | Status: AC
  Administered 2014-01-28: 30 mg via INTRAVENOUS
  Filled 2014-01-28: qty 1

## 2014-01-28 MED ORDER — AMLODIPINE BESYLATE 5 MG PO TABS
10.0000 mg | ORAL_TABLET | Freq: Once | ORAL | Status: AC
  Administered 2014-01-28: 10 mg via ORAL
  Filled 2014-01-28: qty 2

## 2014-01-28 MED ORDER — DIPHENHYDRAMINE HCL 50 MG/ML IJ SOLN
25.0000 mg | Freq: Once | INTRAMUSCULAR | Status: AC
  Administered 2014-01-28: 25 mg via INTRAVENOUS
  Filled 2014-01-28: qty 1

## 2014-01-28 MED ORDER — METOCLOPRAMIDE HCL 5 MG/ML IJ SOLN
10.0000 mg | Freq: Once | INTRAMUSCULAR | Status: AC
  Administered 2014-01-28: 10 mg via INTRAVENOUS
  Filled 2014-01-28: qty 2

## 2014-01-28 MED ORDER — PROCHLORPERAZINE EDISYLATE 5 MG/ML IJ SOLN
10.0000 mg | Freq: Once | INTRAMUSCULAR | Status: AC
  Administered 2014-01-28: 10 mg via INTRAVENOUS
  Filled 2014-01-28: qty 2

## 2014-01-28 MED ORDER — BUTALBITAL-APAP-CAFFEINE 50-325-40 MG PO TABS: 1.0000 | ORAL_TABLET | Freq: Four times a day (QID) | ORAL | Status: DC | PRN

## 2014-01-28 NOTE — ED Notes (Signed)
PT reports 8/10 L Posterior HA w/ nausea. Denies photosensitivity

## 2014-01-28 NOTE — Discharge Instructions (Signed)
Take fioricet for headaches.   Take reglan for mild headaches or nausea.   See your neurologist soon.   Take your BP meds as prescribed.   Return to ER if you have worse headaches, vomiting, weakness.

## 2014-01-28 NOTE — ED Notes (Signed)
Pt woke up with a HA since 1130 and has been nauseated since. States she has vomited a couple times also. States she is waiting for her referral to see a neurologist because of really bad headaches.

## 2014-01-28 NOTE — ED Notes (Signed)
Discharge instructions reviewed with pt. Pt verbalized understanding.   

## 2014-01-28 NOTE — ED Provider Notes (Signed)
CSN: [id number]     Arrival date & time 01/28/14  1606 History   First MD Initiated Contact with Patient 01/28/14 1646     Chief Complaint  Patient presents with  . Headache  . Emesis     (Consider location/radiation/quality/duration/timing/severity/associated sxs/prior Treatment) The history is provided by the patient.  [name] is a 58 y.o. female hx of HTN, DM, recurrent headaches here with worsening headache, nausea. She has daily headaches. This morning, woke up and had gradually worsening headache on the top part of her head. She vomited several times. Denies weakness or numbness. Denies chest pain or shortness of breath. Denies abdominal pain. Denies fevers. Was seen in the ED a month ago with similar symptoms and had nl CT head and ESR.    Past Medical History  Diagnosis Date  . Hypertension   . Hypertension     Hypertensive urgency 05/2006  . Atrial fibrillation   . Hypercholesteremia   . 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 Diabetes mellitus     type 2   Past Surgical History  Procedure Laterality Date  . Tonsilectomy, adenoidectomy, bilateral myringotomy and tubes  age 89  . Dilation and curettage of uterus  1976  . Meniscus repair  03/09    right knee  . Tonsillectomy     Family History  Problem Relation Age of Onset  . Heart failure Mother   . Diabetes Father   . Hypertension Father   . Lung cancer Sister    History  Substance Use Topics  . Smoking status: Never Smoker   . Smokeless tobacco: Never Used  . Alcohol Use: No   OB History   Grav Para Term Preterm Abortions TAB SAB Ect Mult Living                 Review of Systems  Gastrointestinal: Positive for vomiting.  Neurological: Positive for headaches.  All other systems reviewed and are negative.     Allergies  Propoxyphene n-acetaminophen  Home Medications   Prior to Admission medications   Medication Sig Start Date End Date Taking? Authorizing  Turner Baillie  amLODipine (NORVASC) 10 MG tablet Take 1 tablet (10 mg total) by mouth daily. 02/16/13  Yes Kandis Nab, MD  aspirin 81 MG tablet Take 81 mg by mouth at bedtime.    Yes Historical Kris No, MD  B-D ULTRAFINE III SHORT PEN 31G X 8 MM MISC 1 each See admin instructions. Use with insulin 11/18/13  Yes Historical Tonni Mansour, MD  carvedilol (COREG) 25 MG tablet Take 1 tablet (25 mg total) by mouth 2 (two) times daily with a meal. 09/15/13  Yes Zigmund Gottron, MD  chlorthalidone (HYGROTON) 25 MG tablet Take 1 tablet (25 mg total) by mouth daily. Take at 12:00 noon daily 09/15/13  Yes Zigmund Gottron, MD  cloNIDine (CATAPRES) 0.2 MG tablet Take 0.2 mg by mouth 2 (two) times daily.  09/15/13  Yes Zigmund Gottron, MD  clopidogrel (PLAVIX) 75 MG tablet Take 75 mg by mouth daily with breakfast.    Yes Historical Mikaila Grunert, MD  cyclobenzaprine (FLEXERIL) 10 MG tablet Take 1 tablet (10 mg total) by mouth 3 (three) times daily as needed for muscle spasms. 11/24/13  Yes Zigmund Gottron, MD  insulin aspart (NOVOLOG) 100 UNIT/ML injection Inject 10 Units into the skin daily.   Yes Historical Robi Mitter, MD  Insulin Glargine (LANTUS) 100 UNIT/ML Solostar Pen Inject 55 Units  into the skin daily. 12/22/13  Yes Kandis Nab, MD  lisinopril (PRINIVIL,ZESTRIL) 40 MG tablet Take 1 tablet (40 mg total) by mouth daily. 11/26/12  Yes [place], DO  metFORMIN (GLUCOPHAGE) 1000 MG tablet Take 1 tablet (1,000 mg total) by mouth 2 (two) times daily with a meal. 10/31/13  Yes Kandis Nab, MD  ONE TOUCH ULTRA TEST test strip 1 each See admin instructions. Check blood sugar once daily 11/21/13  Yes Historical Aanchal Cope, MD  pantoprazole (PROTONIX) 40 MG tablet Take 40 mg by mouth at bedtime.   Yes Historical Kiana Hollar, MD  rosuvastatin (CRESTOR) 10 MG tablet Take 1 tablet (10 mg total) by mouth at bedtime. 03/25/13  Yes Kandis Nab, MD  sertraline (ZOLOFT) 50 MG tablet Take 1 tablet (50 mg  total) by mouth daily. 11/18/13  Yes Zigmund Gottron, MD  spironolactone (ALDACTONE) 25 MG tablet Take 1 tablet (25 mg total) by mouth daily. 11/24/13  Yes Zigmund Gottron, MD  traZODone (DESYREL) 50 MG tablet Take 1 tablet (50 mg total) by mouth at bedtime as needed for sleep. 11/24/13  Yes Zigmund Gottron, MD   BP 152/69  Pulse 89  Temp(Src) 97.9 F (36.6 C) (Oral)  Resp 22  SpO2 99% Physical Exam  Nursing note and vitals reviewed. Constitutional: She is oriented to person, place, and time. She appears well-nourished.  Uncomfortable   HENT:  Head: Normocephalic.  Mouth/Throat: Oropharynx is clear and moist.  Eyes: Conjunctivae and EOM are normal. Pupils are equal, round, and reactive to light.  Fundus- disc margins clear   Neck: Normal range of motion. Neck supple.  Cardiovascular: Normal rate, regular rhythm and normal heart sounds.   Pulmonary/Chest: Effort normal and breath sounds normal. No respiratory distress. She has no wheezes. She has no rales.  Abdominal: Soft. Bowel sounds are normal. She exhibits no distension. There is no tenderness. There is no rebound and no guarding.  Musculoskeletal: Normal range of motion. She exhibits no edema and no tenderness.  Neurological: She is alert and oriented to person, place, and time. No cranial nerve deficit. Coordination normal.  CN 2-12 intact. Nl strength throughout. No pronator drift. Nl gait   Skin: Skin is warm and dry.  Psychiatric: She has a normal mood and affect. Her behavior is normal. Judgment and thought content normal.    ED Course  Procedures (including critical care time) Labs Review Labs Reviewed  CBC WITH DIFFERENTIAL - Abnormal; Notable for the following:    RBC 5.57 (*)    MCV 71.3 (*)    MCH 22.4 (*)    RDW 15.8 (*)    All other components within normal limits  COMPREHENSIVE METABOLIC PANEL - Abnormal; Notable for the following:    Glucose, Bld 164 (*)    GFR calc non Af Amer 89 (*)     All other components within normal limits  I-STAT TROPOININ, ED    Imaging Review Ct Head Wo Contrast  01/28/2014   CLINICAL DATA:  Headache.  EXAM: CT HEAD WITHOUT CONTRAST  TECHNIQUE: Contiguous axial images were obtained from the base of the skull through the vertex without intravenous contrast.  COMPARISON:  CT scan of Dec 18, 2013.  FINDINGS: Bony calvarium appears intact. Mild chronic ischemic white matter disease is noted. Stable old lacunar infarction is seen in right basal ganglia. No mass effect or midline shift is noted. Ventricular size is within normal limits. There is no evidence of mass lesion, hemorrhage  or acute infarction.  IMPRESSION: Mild chronic ischemic white matter disease. No acute intracranial abnormality seen.   Electronically Signed   By: Sabino Dick [name]   On: 01/28/2014 18:08     EKG Interpretation   Date/Time:  Saturday January 28 2014 18:11:31 EDT Ventricular Rate:  87 PR Interval:  166 QRS Duration: 86 QT Interval:  369 QTC Calculation: 444 R Axis:   33 Text Interpretation:  Sinus rhythm Low voltage, precordial leads  Borderline repolarization abnormality No significant change since last  tracing Confirmed by YAO  MD, DAVID ([id number]) on 01/28/2014 6:29:30 PM      MDM   Final diagnoses:  None   SAKIYAH SHUR is a 58 y.o. female here with hypertension, headaches. I doubt subarachnoid as this is chronic and gradual onset. She has nl neuro exam. ESR a month ago nl. She is very hypertensive so will give PO meds to try and dec BP. Will give migraine cocktail and reassess.   7:51 PM Labs stable. CT head unremarkable. BP dec to 152/69 with PO meds. Felt better with migraine cocktail. Has neuro f/u. Will d/c home with fioricet and reglan.   Wandra Arthurs, MD 01/28/14 [phone]

## 2014-02-01 ENCOUNTER — Encounter: Payer: Self-pay | Admitting: Family Medicine

## 2014-02-01 ENCOUNTER — Ambulatory Visit (INDEPENDENT_AMBULATORY_CARE_PROVIDER_SITE_OTHER): Payer: Commercial Managed Care - HMO | Admitting: Family Medicine

## 2014-02-01 VITALS — BP 173/92 | HR 96 | Temp 98.4°F | Wt 266.0 lb

## 2014-02-01 DIAGNOSIS — I10 Essential (primary) hypertension: Secondary | ICD-10-CM

## 2014-02-01 NOTE — Assessment & Plan Note (Signed)
A: Persistently elevated, though pt admits to not taking Coreg for 1 week or more due to not being able to get it filled. No red flags per history or on exam. Pt reports compliance with other meds; unclear if she is still "supposed" to be taking chlorthalidone. Recent CMP unremarkable.  P: Continue current meds (lisinopril 40 mg daily, spironolactone 25 mg daily, amlodipine 10 mg daily, clonidine 0.2 mg BID and Coreg 25 mg BID); encouraged to pick up Rx for Coreg and to resume it. Pt with pharmacy clinic and cardiology f/u scheduled; will wait until she is seen by those providers and fills Coreg to see if / what adjustments need to be made. If persistently elevated despite all these meds, could consider addition of something like hydralazine. F/u in about 1 month.

## 2014-02-01 NOTE — Patient Instructions (Addendum)
Thank you for coming in, today!  Your blood pressure is still elevated, but some better than what you've had before. Make sure you pick up your carvedilol and take it twice a day.  Keep your appointments with the heart doctor and the pharmacy clinic. Talk to [name] about your diabetes and your blood pressure. Make sure you ask your heart doctor about Plavix, if you should still be on it.  You should hear from [place] or the neurology office, soon. Please let me know if you need help with your CPAP order, too.  The website I mentioned to look at to help with diet is "[email or web address]." You can also call Dr. Jenne Campus to see if you'd like to see her. She is our nutritionist. If you do call her and want to see her about your diet, let her and me know and we'll set it up for you.  Come back to see me in about 1-2 months. We'll recheck your blood pressure and see what else, if anything, needs to change. You can always come back sooner, if you need. Please feel free to call with any questions or concerns at any time, at [phone]. --Dr. Venetia Maxon

## 2014-02-01 NOTE — Progress Notes (Signed)
   Subjective:    Patient ID: [name], female    DOB: 1955/08/08, 58 y.o.   MRN: [id number]  HPI: Pt presents to clinic for follow-up of her hypertension. She reports compliance with her medications except she has not taken carvedilol in the past week due to not being able to afford it (she states she needs to pick it up today as she got her disability check this afternoon). She is prescribed clonidine twice a day, carvedilol twice a day, spironolactone once daily, lisinopril once daily, and Norvasc once daily. Chlorthalidone is on her medication list but she states it has been a while since she thinks she has taken it. She tries not to miss medications but does state she often has to wait until her disability check comes to get refills filled.  Of note, pt also has diabetes mellitus, HLD, depression, and chronic headaches. She repots compliance with her regular medications and has upcoming cardiology and pharmacy clinic follow-ups.  Review of Systems: As above. Pt does have SOB with exertion. She has minimal swelling in her legs. She denies chest pain or diaphoresis. Her chronic headaches are some better and she has neurology f/u pending.     Objective:   Physical Exam BP 173/92  Pulse 96  Temp(Src) 98.4 F (36.9 C) (Oral)  Wt 266 lb (120.657 kg) Manual recheck BP 165 / 95 Gen: well-appearing adult female  HEENT: [place]/AT, EOMI, PERRLA Neuro: alert / oriented, no gross focal motor deficit Cardio: RRR, no murmur; heart sounds distant secondary to body habitus Pulm: CTAB, no wheezes Abd: soft, nontender, BS+; obese     Assessment & Plan:

## 2014-02-03 ENCOUNTER — Ambulatory Visit (HOSPITAL_COMMUNITY)
Admission: RE | Admit: 2014-02-03 | Discharge: 2014-02-03 | Disposition: A | Discharge status: Home / Self Care | Payer: Medicare HMO | Source: Ambulatory Visit | Attending: Family Medicine | Admitting: Family Medicine

## 2014-02-03 DIAGNOSIS — I079 Rheumatic tricuspid valve disease, unspecified: Secondary | ICD-10-CM | POA: Insufficient documentation

## 2014-02-03 DIAGNOSIS — R0989 Other specified symptoms and signs involving the circulatory and respiratory systems: Principal | ICD-10-CM | POA: Insufficient documentation

## 2014-02-03 DIAGNOSIS — Z8673 Personal history of transient ischemic attack (TIA), and cerebral infarction without residual deficits: Secondary | ICD-10-CM | POA: Insufficient documentation

## 2014-02-03 DIAGNOSIS — E119 Type 2 diabetes mellitus without complications: Secondary | ICD-10-CM | POA: Insufficient documentation

## 2014-02-03 DIAGNOSIS — I10 Essential (primary) hypertension: Secondary | ICD-10-CM | POA: Insufficient documentation

## 2014-02-03 DIAGNOSIS — R079 Chest pain, unspecified: Secondary | ICD-10-CM | POA: Insufficient documentation

## 2014-02-03 DIAGNOSIS — R0609 Other forms of dyspnea: Secondary | ICD-10-CM | POA: Insufficient documentation

## 2014-02-03 DIAGNOSIS — I4891 Unspecified atrial fibrillation: Secondary | ICD-10-CM | POA: Insufficient documentation

## 2014-02-03 NOTE — Progress Notes (Signed)
  Echocardiogram 2D Echocardiogram has been performed.  [name] 02/03/2014, 8:57 AM

## 2014-02-14 ENCOUNTER — Ambulatory Visit: Payer: Commercial Managed Care - HMO | Admitting: Pharmacist

## 2014-02-15 ENCOUNTER — Encounter: Payer: Self-pay | Admitting: Family Medicine

## 2014-02-21 ENCOUNTER — Ambulatory Visit (INDEPENDENT_AMBULATORY_CARE_PROVIDER_SITE_OTHER): Payer: Commercial Managed Care - HMO | Admitting: Neurology

## 2014-02-21 ENCOUNTER — Encounter: Payer: Self-pay | Admitting: Neurology

## 2014-02-21 VITALS — BP 152/100 | HR 78 | Resp 18 | Ht 62.0 in | Wt 270.5 lb

## 2014-02-21 DIAGNOSIS — G43019 Migraine without aura, intractable, without status migrainosus: Secondary | ICD-10-CM | POA: Insufficient documentation

## 2014-02-21 MED ORDER — NORTRIPTYLINE HCL 10 MG PO CAPS: 10.0000 mg | ORAL_CAPSULE | Freq: Every day | ORAL | Status: DC

## 2014-02-21 NOTE — Patient Instructions (Signed)
1.  I would like to change the Zoloft to nortriptyline which is an antidepressant used for chronic headaches.  I will contact your cardiologist to see if it is okay to use.  Otherwise, I would try topamax again. 2.  Use an NSAID (Advil, Aleve), tramadol or Excedrin migraine but no pain relievers more than 2 days out of the week. 3.  Follow up in 2 months.

## 2014-02-21 NOTE — Progress Notes (Signed)
NEUROLOGY CONSULTATION NOTE  [name] MRN: [id number] DOB: [birth year]  Referring provider: Liam Graham, MD Primary care provider: Christa See, MD  Reason for consult:  Headache  HISTORY OF PRESENT ILLNESS: [name] is a 58 year old right-handed woman with history of type II diabetes, hypertension, stroke, chronic headaches, OSA and depression who presents for headache.  Onset:  Since January 2010, following acute stroke. She reportedly developed a facial droop. MRI of the brain performed 08/16/08 revealed punctate infarcts in the left paracentral pontine region and the left anterior frontal region. MRA of the head revealed intracranial atherosclerotic changes, including delineation of the AICAs and narrowing of the basilar artery. No aneurysms noted. Location:  Varies Quality:  Initially has a quick 1-2 seconds stabbing headache followed by diffuse throbbing headache Intensity:  Stabbing pain 10 out of 10, diffuse headache 8/10 Aura:  No Prodrome:  No Associated symptoms:  On one occasion she had nausea. Duration:  Stabbing pain lasts one to 2 seconds. Diffuse headache lasts all day Frequency:  2 days per week (8 headache days per month) Triggers/exacerbating factors:  None Relieving factors:  None Activity:  Can force herself to be active  Past abortive therapy:  Ibuprofen, Tylenol Past preventative therapy:  Topamax (ineffective), steroid injections in the head and face  Current abortive therapy:  Takes tramadol every night to help her sleep and as needed for headache Current preventative therapy:  None Other medications: Aspirin, Plavix, coronary, clonidine, Flexeril, Lantus, lisinopril, spironolactone, trazodone, Zoloft  Caffeine:  No Alcohol:  No Smoker:  No Diet:  Poor Exercise:  No Depression/stress:  Yes Sleep hygiene:  Poor Family history of headache:  Father. Mother had history of ruptured intracranial aneurysm  CT of the head from 12/19/13  and 01/28/14 showed chronic microvascular ischemic changes but no acute abnormalities.  12/18/13 Sed Rate 24 01/24/14 HGB A1c 8.3, LDL 83 02/03/14 2D Echo: Ejection fraction 55-60%, no PFO or atrial septal defect or source of emboli 01/28/14 ECG:  SR 87 bpm, QT 369, QTc 444  PAST MEDICAL HISTORY: Past Medical History  Diagnosis Date  . Hypertension   . Hypertension     Hypertensive urgency 05/2006  . Atrial fibrillation   . Hypercholesteremia   . 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 Diabetes mellitus     type 2    PAST SURGICAL HISTORY: Past Surgical History  Procedure Laterality Date  . Tonsilectomy, adenoidectomy, bilateral myringotomy and tubes  age 47  . Dilation and curettage of uterus  1976  . Meniscus repair  03/09    right knee  . Tonsillectomy      MEDICATIONS: Current Outpatient Prescriptions on File Prior to Visit  Medication Sig Dispense Refill  . amLODipine (NORVASC) 10 MG tablet Take 1 tablet (10 mg total) by mouth daily.  30 tablet  3  . aspirin 81 MG tablet Take 81 mg by mouth at bedtime.       . B-D ULTRAFINE III SHORT PEN 31G X 8 MM MISC 1 each See admin instructions. Use with insulin      . carvedilol (COREG) 25 MG tablet Take 1 tablet (25 mg total) by mouth 2 (two) times daily with a meal.  60 tablet  3  . chlorthalidone (HYGROTON) 25 MG tablet Take 1 tablet (25 mg total) by mouth daily. Take at 12:00 noon daily  30 tablet  1  . cloNIDine (CATAPRES) 0.2 MG tablet Take 0.2  mg by mouth 2 (two) times daily.   60 tablet  1  . clopidogrel (PLAVIX) 75 MG tablet Take 75 mg by mouth daily with breakfast.       . cyclobenzaprine (FLEXERIL) 10 MG tablet Take 1 tablet (10 mg total) by mouth 3 (three) times daily as needed for muscle spasms.  90 tablet  2  . insulin aspart (NOVOLOG) 100 UNIT/ML injection Inject 10 Units into the skin daily.      . Insulin Glargine (LANTUS) 100 UNIT/ML Solostar Pen Inject 55 Units into the skin daily.  15  mL  11  . lisinopril (PRINIVIL,ZESTRIL) 40 MG tablet Take 1 tablet (40 mg total) by mouth daily.  30 tablet  6  . metFORMIN (GLUCOPHAGE) 1000 MG tablet Take 1 tablet (1,000 mg total) by mouth 2 (two) times daily with a meal.  60 tablet  6  . ONE TOUCH ULTRA TEST test strip 1 each See admin instructions. Check blood sugar once daily      . pantoprazole (PROTONIX) 40 MG tablet Take 40 mg by mouth at bedtime.      . rosuvastatin (CRESTOR) 10 MG tablet Take 1 tablet (10 mg total) by mouth at bedtime.  90 tablet  3  . sertraline (ZOLOFT) 50 MG tablet Take 1 tablet (50 mg total) by mouth daily.  30 tablet  3  . spironolactone (ALDACTONE) 25 MG tablet Take 1 tablet (25 mg total) by mouth daily.  30 tablet  1  . traZODone (DESYREL) 50 MG tablet Take 1 tablet (50 mg total) by mouth at bedtime as needed for sleep.  30 tablet  3   No current facility-administered medications on file prior to visit.    ALLERGIES: Allergies  Allergen Reactions  . Propoxyphene N-Acetaminophen Hives, Itching, Swelling and Other (See Comments)    REACTION: Hallucinations    FAMILY HISTORY: Family History  Problem Relation Age of Onset  . Heart failure Mother   . Diabetes Father   . Hypertension Father   . Lung cancer Sister     SOCIAL HISTORY: History   Social History  . Marital Status: Legally Separated    Spouse Name: N/A    Number of Children: 3  . Years of Education: N/A   Occupational History  . med tech    Social History Main Topics  . Smoking status: Never Smoker   . Smokeless tobacco: Never Used  . Alcohol Use: No  . Drug Use: No  . Sexual Activity: Not on file   Other Topics Concern  . Not on file   Social History Narrative   Boyfriend has lung cancer.   Lives with grandson and youngest daughter.    REVIEW OF SYSTEMS: Constitutional: No fevers, chills, or sweats, no generalized fatigue, change in appetite Eyes: No visual changes, double vision, eye pain Ear, nose and throat: No  hearing loss, ear pain, nasal congestion, sore throat Cardiovascular: No chest pain, palpitations Respiratory:  No shortness of breath at rest or with exertion, wheezes GastrointestinaI: No nausea, vomiting, diarrhea, abdominal pain, fecal incontinence Genitourinary:  No dysuria, urinary retention or frequency Musculoskeletal:  No neck pain, back pain Integumentary: No rash, pruritus, skin lesions Neurological: as above Psychiatric: No depression, insomnia, anxiety Endocrine: No palpitations, fatigue, diaphoresis, mood swings, change in appetite, change in weight, increased thirst Hematologic/Lymphatic:  No anemia, purpura, petechiae. Allergic/Immunologic: no itchy/runny eyes, nasal congestion, recent allergic reactions, rashes  PHYSICAL EXAM: Filed Vitals:   02/21/14 0957  BP: 152/100  Pulse:  78  Resp: 18   General: No acute distress Head:  Normocephalic/atraumatic Neck: supple, no paraspinal tenderness, full range of motion Back: No paraspinal tenderness Heart: regular rate and rhythm Lungs: Clear to auscultation bilaterally. Vascular: No carotid bruits. Neurological Exam: Mental status: alert and oriented to person, place, and time, recent and remote memory intact, fund of knowledge intact, attention and concentration intact, speech fluent and not dysarthric, language intact. Cranial nerves: CN I: not tested CN II: pupils equal, round and reactive to light, visual fields intact, fundi unremarkable, without vessel changes, exudates, hemorrhages or papilledema. CN III, IV, VI:  full range of motion, no nystagmus, no ptosis CN V: facial sensation intact CN VII: upper and lower face symmetric CN VIII: hearing intact CN IX, X: gag intact, uvula midline CN XI: sternocleidomastoid and trapezius muscles intact CN XII: tongue midline Bulk & Tone: normal, no fasciculations. Motor: 5 out of 5 throughout Sensation: Temperature and vibration intact Deep Tendon Reflexes: 1+ throughout  except absent in the ankles, toes downgoing Finger to nose testing: No Gait: Antalgic gait, walks with a cane. Romberg with sway.  IMPRESSION: 1. Migraine without aura 2. Cerebrovascular disease  PLAN: 1.  Would like to discontinue Zoloft and start nortriptyline 10mg  at bedtime.  QTc interval looks okay. 2.  For abortive therapy, she would have to try an NSAID, Excedrin or tramadol (cannot use triptans due to stroke history).  Limit use to 2 days per week. 3.  Follow up in 2 months.  Thank you for allowing me to take part in the care of this patient.  Metta Clines, DO  CC:  Christa See, MD

## 2014-03-29 ENCOUNTER — Encounter: Payer: Self-pay | Admitting: Family Medicine

## 2014-03-29 ENCOUNTER — Ambulatory Visit (INDEPENDENT_AMBULATORY_CARE_PROVIDER_SITE_OTHER): Payer: Commercial Managed Care - HMO | Admitting: Family Medicine

## 2014-03-29 VITALS — BP 192/91 | HR 72 | Temp 98.7°F | Wt 270.2 lb

## 2014-03-29 DIAGNOSIS — I10 Essential (primary) hypertension: Secondary | ICD-10-CM

## 2014-03-29 MED ORDER — ROSUVASTATIN CALCIUM 10 MG PO TABS: 10.0000 mg | ORAL_TABLET | Freq: Every day | ORAL | Status: DC

## 2014-03-29 MED ORDER — CARVEDILOL 25 MG PO TABS: 25.0000 mg | ORAL_TABLET | Freq: Two times a day (BID) | ORAL | Status: DC

## 2014-03-29 MED ORDER — CLOPIDOGREL BISULFATE 75 MG PO TABS: 75.0000 mg | ORAL_TABLET | Freq: Every day | ORAL | Status: DC

## 2014-03-29 NOTE — Patient Instructions (Signed)
Thank you for coming in, today!  Keep taking your blood pressure medicine without any changes for now. I will send in refills for carvedilol, Plavix, and Crestor.  Make sure you follow up with [name] for the ambulatory blood pressure monitoring. Come back to see me at the end of September (after you see [name] if possible). Call me if and when you need prescriptions sent to [facility]. Please feel free to call with any questions or concerns at any time, at [phone]. --Dr. Venetia Maxon

## 2014-03-29 NOTE — Assessment & Plan Note (Signed)
A: BP continues to be persistently elevated, though pt has been out of Coreg and has not taken medications today. She reports compliance, otherwise; uncertain if she is taking spironolactone as she has not had a refill ordered in a while. No red flag symptoms to suggest frank cardiac decompensation, but she has a history of stroke and has had very high pressures in the past few visits.  P: Continue lisinopril 40 mg daily, spironolactone 25 mg daily, amlodipine 10 mg daily, clonidine 0.2 mg BID, and Coreg 25 mg BID. Sent in Rx for Coreg (and Plavix and Crestor). Pt to f/u with pharmacy clinic; would strongly recommend ambulatory monitoring to see if she is persistently hypertensive at home. Next addition to consider would be hydralazine. F/u in 1 month, after pharmacy clinic visit. Will also need to address HLD, DM, etc, at that time and check labs.

## 2014-03-29 NOTE — Progress Notes (Signed)
   Subjective:    Patient ID: [name], female    DOB: [birth year], 58 y.o.   MRN: [id number]  HPI: Pt presents to clinic for f/u of HTN. Of note, she did not take her blood pressure medicines today; she went to church this morning and didn't take her meds yet as they make her drowsy. She does not check her BP at home. She feels reasonably well. She states she occasionally has chest pain related to her paroxysmal atrial fibrillation, but it has not bothered her in several months. She denies significant SOB or significant swelling in her legs that she notes.   She still complains of chronic headaches, for which she will be seen in September next. Her neurologist is adjusting her medications; she is now on nortriptyline. She is unsure of how much this is helping.  Of note, pt reports that her mail-order pharmacy has changed and she needs Rx's changed. Pt requests Crestor, Plavix, and Coreg need to be sent to [facility] on [address]; she has all of her other medications, but they will eventually need to be sent to [facility] as well.  Review of Systems: As above. She does complain of "heat waves" at bedtime.     Objective:   Physical Exam BP 192/91  Pulse 72  Temp(Src) 98.7 F (37.1 C) (Oral)  Wt 270 lb 3.2 oz (122.562 kg) Manual recheck BP: 182/98 Gen: well-appearing adult female in NAD HEENT: [place]/AT, EOMI, PERRLA Cardio: RRR, no murmur appreciated Pulm: CTAB, no wheezes or crackles, normal WOB Abd: soft, nontender, BS+ Ext: warm, well-perfused, faint trace pitting edema at ankles bilaterally     Assessment & Plan:

## 2014-04-17 ENCOUNTER — Ambulatory Visit (INDEPENDENT_AMBULATORY_CARE_PROVIDER_SITE_OTHER): Payer: Commercial Managed Care - HMO | Admitting: Pharmacist

## 2014-04-17 ENCOUNTER — Encounter: Payer: Self-pay | Admitting: Pharmacist

## 2014-04-17 VITALS — BP 150/99 | HR 109 | Ht 62.0 in | Wt 269.2 lb

## 2014-04-17 DIAGNOSIS — IMO0002 Reserved for concepts with insufficient information to code with codable children: Secondary | ICD-10-CM

## 2014-04-17 DIAGNOSIS — E1165 Type 2 diabetes mellitus with hyperglycemia: Secondary | ICD-10-CM

## 2014-04-17 DIAGNOSIS — E785 Hyperlipidemia, unspecified: Secondary | ICD-10-CM

## 2014-04-17 DIAGNOSIS — I10 Essential (primary) hypertension: Secondary | ICD-10-CM

## 2014-04-17 DIAGNOSIS — IMO0001 Reserved for inherently not codable concepts without codable children: Secondary | ICD-10-CM

## 2014-04-17 MED ORDER — GLUCOSE BLOOD VI STRP: ORAL_STRIP | Status: DC

## 2014-04-17 MED ORDER — ROSUVASTATIN CALCIUM 10 MG PO TABS: 10.0000 mg | ORAL_TABLET | Freq: Every day | ORAL | Status: DC

## 2014-04-17 NOTE — Patient Instructions (Addendum)
Started on ambulatory blood pressure monitoring today. Please refer to written instructions and contact the clinic with any questions or concerns.  Wearing the Blood Pressure Monitor  The cuff will inflate every 30 minutes during the day and every 60 minutes while you sleep.  Your blood pressure readings will NOT display after cuff inflation  Fill out the blood pressure-activity diary during the day, especially during activities that may affect your reading -- such as exercise, stress, walking, taking your blood pressure medications  Important things to know:  Avoid taking the monitor off for the next 24 hours, unless it causes you discomfort or pain.  Do NOT get the monitor wet and do NOT dry to clean the monitor with any cleaning products.  Do NOT drop the monitor.  Do NOT put the monitor on anyone else's arm.  When the cuff inflates, avoid excess movement. Let the cuffed arm hang loosely, slightly away from the body. Avoid flexing the muscles or moving the hand/fingers.  When you go to sleep, make sure that the hose is not kinked.  Remember to fill out the blood pressure activity diary.  If you experience severe pain or unusual pain (not associated with getting your blood pressure checked), remove the monitor.  Troubleshooting:  Code  Troubleshooting   1  Check cuff position, tighten cuff   2, 3  Remain still during reading   4, 87  Check air hose connections and make sure cuff is tight   85, 89  Check hose connections and make tubing is not crimped   86  Push START/STOP to restart reading   88, 91  Retry by pushing START/STOP   90  Replace batteries. If problem persists, remove monitor and bring back to   clinic at follow up   97, 98, 99  Service required - Remove monitor and bring back to clinic at follow up    Blood Pressure Activity Diary  Time Lying down/ Sleeping Walking/ Exercise Stressed/ Angry Headache/ Pain Dizzy  9 AM       10 AM       11 AM       12 PM        1 PM       2 PM       Time Lying down/ Sleeping Walking/ Exercise Stressed/ Angry Headache/ Pain Dizzy  3 PM       4 PM        5 PM       6 PM       7 PM       8 PM       Time Lying down/ Sleeping Walking/ Exercise Stressed/ Angry Headache/ Pain Dizzy  9 PM       10 PM       11 PM       12 AM       1 AM       2 AM       3 AM       Time Lying down/ Sleeping Walking/ Exercise Stressed/ Angry Headache/ Pain Dizzy  4 AM       5 AM       6 AM       7 AM       8 AM       9 AM       10 AM        Time you woke up: _________  Time you went to sleep:__________    Come back tomorrow at ___8:30AM___ to have the monitor removed  Call the [facility] if you have any questions before then [phone])

## 2014-04-17 NOTE — Assessment & Plan Note (Addendum)
Patient lost monitor recently .   New strips and monitor prescription provided.

## 2014-04-17 NOTE — Assessment & Plan Note (Addendum)
Patient provided refill of crestor.

## 2014-04-17 NOTE — Progress Notes (Signed)
S:    Patient arrives in normal pessimistic mood. She presents to the clinic for ambulatory blood pressure evaluation. Medication compliance is reported to be erratic adherence with some evening doses of clonidine to help her sleep. Discussed procedure for wearing the monitor and gave patient written instructions. Monitor was placed on non-dominant arm with instructions to return in the morning.   Current BP Medications include:  amlodipine 10mg  daily, carvedilol 25mg  twice daily, clonidine 0.2mg  twice daily, chlorthalidone 25mg  daily at noon, lisinopril 40mg  daily, and spironolactone 25mg  daily.   Patient returned to the clinic and reported no issues with monitor.   Patient left monitor for unloading data and was followed up by phone.   O:  Last 3 Office BP readings: 192/91 mmHg 152/100 mmHg 173/92 mmHg  Today's Office BP reading: 150/99 mmHg (manual reading)  ABPM Study Data: Arm Placement left arm   Overall Mean 24hr BP:   188/95 mmHg HR: 65   Daytime Mean BP:  185/94 mmHg HR: 66   Nighttime Mean BP:  199/100 mmHg HR: 62   Dipping Pattern: No.  Sys:   -7.7%   Dia: -6.5%  (Higher while asleep)  [normal dipping ~10-20%]   Non-hypertensive ABPM thresholds: daytime BP <135/85 mmHg, sleeptime BP <120/70 mmHg NICE Hypertension Guidelines ([place]) using ABPM: Stage I: >135/85 mmHg, Stage 2: >150/95 mmHg)   BMET    Component Value Date/Time   NA 141 01/28/2014 1703   K 3.8 01/28/2014 1703   CL 102 01/28/2014 1703   CO2 24 01/28/2014 1703   GLUCOSE 164* 01/28/2014 1703   BUN 11 01/28/2014 1703   CREATININE 0.79 01/28/2014 1703   CREATININE 0.79 01/24/2014 1419   CALCIUM 9.6 01/28/2014 1703   GFRNONAA 89* 01/28/2014 1703   GFRAA >90 01/28/2014 1703    A/P: History of longstanding hypertension found to have consistent readings throughout the day greater than goal.   Given 24-hour ambulatory blood pressure demonstrates poor control with an average blood pressure of 188/95 mmHg, and a  nocturnal dipping pattern that is abnormal  with minimal change in BP.  Changes to medications - called to patient - switch both amlodipine and lisinopril to QPM dosing to assist with creating artificial dipping.  Asked to bring all medications to next MD visit in 2 weeks to evaluate adherence.  After adherence in assessed, consider next dose changes including higher doses of both spironolactone AND clonidine.    Results reviewed and written information provided.   F/U Clinic Visit with Dr. Venetia Maxon.  Total time in face-to-face counseling 7minutes.  Patient seen with Gloriajean Dell, PharmD Candidate;  Fuller [place],  PharmD Resident

## 2014-04-18 NOTE — Assessment & Plan Note (Signed)
History of longstanding hypertension found to have consistent readings throughout the day greater than goal.   Given 24-hour ambulatory blood pressure demonstrates poor control with an average blood pressure of 188/95 mmHg, and a nocturnal dipping pattern that is abnormal  with minimal change in BP.  Changes to medications - called to patient - switch both amlodipine and lisinopril to QPM dosing to assist with creating artificial dipping.  Asked to bring all medications to next MD visit in 2 weeks to evaluate adherence.  After adherence in assessed, consider next dose changes including higher doses of both spironolactone AND clonidine.

## 2014-04-18 NOTE — Progress Notes (Signed)
Patient ID: [name], female   DOB: 09/11/1955, 58 y.o.   MRN: [id number] Reviewed: Agree with Dr. Koval's documentation and management. 

## 2014-04-24 ENCOUNTER — Ambulatory Visit: Payer: Commercial Managed Care - HMO | Admitting: Neurology

## 2014-04-25 ENCOUNTER — Telehealth: Payer: Self-pay | Admitting: Neurology

## 2014-04-25 NOTE — Telephone Encounter (Signed)
Pt no showed 2 month follow up appt w/ Dr. Tomi Likens 04/24/14. No show letter mailed to pt / Sherri S.

## 2014-05-01 ENCOUNTER — Ambulatory Visit: Payer: Commercial Managed Care - HMO | Admitting: Family Medicine

## 2014-06-02 ENCOUNTER — Emergency Department (HOSPITAL_COMMUNITY)
Admission: EM | Admit: 2014-06-02 | Discharge: 2014-06-02 | Disposition: A | Discharge status: Home / Self Care | Payer: Medicare HMO | Attending: Emergency Medicine | Admitting: Emergency Medicine

## 2014-06-02 ENCOUNTER — Encounter (HOSPITAL_COMMUNITY): Payer: Self-pay | Admitting: Emergency Medicine

## 2014-06-02 ENCOUNTER — Emergency Department (HOSPITAL_COMMUNITY): Payer: Medicare HMO

## 2014-06-02 DIAGNOSIS — E780 Pure hypercholesterolemia: Secondary | ICD-10-CM | POA: Diagnosis not present

## 2014-06-02 DIAGNOSIS — Z7902 Long term (current) use of antithrombotics/antiplatelets: Secondary | ICD-10-CM | POA: Diagnosis not present

## 2014-06-02 DIAGNOSIS — I4891 Unspecified atrial fibrillation: Secondary | ICD-10-CM | POA: Insufficient documentation

## 2014-06-02 DIAGNOSIS — Z8673 Personal history of transient ischemic attack (TIA), and cerebral infarction without residual deficits: Secondary | ICD-10-CM | POA: Diagnosis not present

## 2014-06-02 DIAGNOSIS — E119 Type 2 diabetes mellitus without complications: Secondary | ICD-10-CM | POA: Insufficient documentation

## 2014-06-02 DIAGNOSIS — Z7982 Long term (current) use of aspirin: Secondary | ICD-10-CM | POA: Insufficient documentation

## 2014-06-02 DIAGNOSIS — I10 Essential (primary) hypertension: Secondary | ICD-10-CM | POA: Diagnosis not present

## 2014-06-02 DIAGNOSIS — Z794 Long term (current) use of insulin: Secondary | ICD-10-CM | POA: Insufficient documentation

## 2014-06-02 DIAGNOSIS — R519 Headache, unspecified: Secondary | ICD-10-CM

## 2014-06-02 DIAGNOSIS — N390 Urinary tract infection, site not specified: Secondary | ICD-10-CM

## 2014-06-02 DIAGNOSIS — R51 Headache: Secondary | ICD-10-CM | POA: Diagnosis present

## 2014-06-02 DIAGNOSIS — Z79899 Other long term (current) drug therapy: Secondary | ICD-10-CM | POA: Insufficient documentation

## 2014-06-02 LAB — COMPREHENSIVE METABOLIC PANEL
ALBUMIN: 3.6 g/dL (ref 3.5–5.2)
ALT: 20 U/L (ref 0–35)
AST: 24 U/L (ref 0–37)
Alkaline Phosphatase: 128 U/L — ABNORMAL HIGH (ref 39–117)
Anion gap: 16 — ABNORMAL HIGH (ref 5–15)
BUN: 10 mg/dL (ref 6–23)
CALCIUM: 9.2 mg/dL (ref 8.4–10.5)
CO2: 21 mEq/L (ref 19–32)
Chloride: 101 mEq/L (ref 96–112)
Creatinine, Ser: 0.73 mg/dL (ref 0.50–1.10)
GFR calc Af Amer: >90 mL/min (ref >90)
GFR calc non Af Amer: >90 mL/min (ref >90)
Glucose, Bld: 257 mg/dL — ABNORMAL HIGH (ref 70–99)
Potassium: 4.8 mEq/L (ref 3.7–5.3)
Sodium: 138 mEq/L (ref 137–147)
TOTAL PROTEIN: 7.4 g/dL (ref 6.0–8.3)
Total Bilirubin: 0.2 mg/dL — ABNORMAL LOW (ref 0.3–1.2)

## 2014-06-02 LAB — URINALYSIS, ROUTINE W REFLEX MICROSCOPIC
Bilirubin Urine: NEGATIVE
GLUCOSE, UA: 100 mg/dL — AB
KETONES UR: NEGATIVE mg/dL
Nitrite: POSITIVE — AB
PH: 5.5 (ref 5.0–8.0)
PROTEIN: NEGATIVE mg/dL
Specific Gravity, Urine: 1.012 (ref 1.005–1.030)
Urobilinogen, UA: 0.2 mg/dL (ref 0.0–1.0)

## 2014-06-02 LAB — URINE MICROSCOPIC-ADD ON

## 2014-06-02 LAB — DIFFERENTIAL
BASOS ABS: 0 10*3/uL (ref 0.0–0.1)
Basophils Relative: 0 % (ref 0–1)
EOS ABS: 0.1 10*3/uL (ref 0.0–0.7)
Eosinophils Relative: 1 % (ref 0–5)
LYMPHS ABS: 3.9 10*3/uL (ref 0.7–4.0)
Lymphocytes Relative: 48 % — ABNORMAL HIGH (ref 12–46)
MONO ABS: 0.4 10*3/uL (ref 0.1–1.0)
Monocytes Relative: 5 % (ref 3–12)
NEUTROS ABS: 3.7 10*3/uL (ref 1.7–7.7)
Neutrophils Relative %: 46 % (ref 43–77)

## 2014-06-02 LAB — RAPID URINE DRUG SCREEN, HOSP PERFORMED
AMPHETAMINES: NOT DETECTED
Barbiturates: NOT DETECTED
Benzodiazepines: NOT DETECTED
Cocaine: NOT DETECTED
Opiates: NOT DETECTED
Tetrahydrocannabinol: NOT DETECTED

## 2014-06-02 LAB — CBC
HEMATOCRIT: 38.8 % (ref 36.0–46.0)
Hemoglobin: 12.7 g/dL (ref 12.0–15.0)
MCH: 23.3 pg — AB (ref 26.0–34.0)
MCHC: 32.7 g/dL (ref 30.0–36.0)
MCV: 71.1 fL — AB (ref 78.0–100.0)
Platelets: 243 10*3/uL (ref 150–400)
RBC: 5.46 MIL/uL — ABNORMAL HIGH (ref 3.87–5.11)
RDW: 14.6 % (ref 11.5–15.5)
WBC: 8.1 10*3/uL (ref 4.0–10.5)

## 2014-06-02 LAB — I-STAT CHEM 8, ED
BUN: 11 mg/dL (ref 6–23)
Calcium, Ion: 1.19 mmol/L (ref 1.12–1.23)
Chloride: 102 mEq/L (ref 96–112)
Creatinine, Ser: 0.8 mg/dL (ref 0.50–1.10)
Glucose, Bld: 269 mg/dL — ABNORMAL HIGH (ref 70–99)
HCT: 43 % (ref 36.0–46.0)
Hemoglobin: 14.6 g/dL (ref 12.0–15.0)
Potassium: 4.3 mEq/L (ref 3.7–5.3)
SODIUM: 139 meq/L (ref 137–147)
TCO2: 25 mmol/L (ref 0–100)

## 2014-06-02 LAB — CBG MONITORING, ED: Glucose-Capillary: 249 mg/dL — ABNORMAL HIGH (ref 70–99)

## 2014-06-02 LAB — PROTIME-INR
INR: 1.01 (ref 0.00–1.49)
Prothrombin Time: 13.4 seconds (ref 11.6–15.2)

## 2014-06-02 LAB — ETHANOL

## 2014-06-02 LAB — I-STAT TROPONIN, ED: TROPONIN I, POC: 0 ng/mL (ref 0.00–0.08)

## 2014-06-02 LAB — APTT: aPTT: 22 seconds — ABNORMAL LOW (ref 24–37)

## 2014-06-02 MED ORDER — LORAZEPAM 2 MG/ML IJ SOLN
INTRAMUSCULAR | Status: AC
  Administered 2014-06-02: 2 mg via INTRAMUSCULAR
  Filled 2014-06-02: qty 1

## 2014-06-02 MED ORDER — CEPHALEXIN 500 MG PO CAPS: 500.0000 mg | ORAL_CAPSULE | Freq: Four times a day (QID) | ORAL | Status: DC

## 2014-06-02 MED ORDER — HYDROCODONE-ACETAMINOPHEN 5-325 MG PO TABS: 1.0000 | ORAL_TABLET | ORAL | Status: DC | PRN

## 2014-06-02 MED ORDER — MORPHINE SULFATE 4 MG/ML IJ SOLN
4.0000 mg | Freq: Once | INTRAMUSCULAR | Status: AC
  Administered 2014-06-02: 4 mg via INTRAVENOUS
  Filled 2014-06-02: qty 1

## 2014-06-02 MED ORDER — ONDANSETRON HCL 4 MG/2ML IJ SOLN
4.0000 mg | Freq: Once | INTRAMUSCULAR | Status: AC
  Administered 2014-06-02: 4 mg via INTRAVENOUS
  Filled 2014-06-02: qty 2

## 2014-06-02 MED ORDER — LORAZEPAM 2 MG/ML IJ SOLN
2.0000 mg | Freq: Once | INTRAMUSCULAR | Status: AC
Start: 2014-06-02 — End: 2014-06-02
  Administered 2014-06-02: 2 mg via INTRAMUSCULAR

## 2014-06-02 NOTE — ED Provider Notes (Signed)
CSN: [id number]     Arrival date & time 06/02/14  0741 History   First MD Initiated Contact with Patient 06/02/14 [id number]     Chief Complaint  Patient presents with  . Headache     (Consider location/radiation/quality/duration/timing/severity/associated sxs/prior Treatment) HPI Comments: Patient is a 58 year old female with history of hypertension, atrial fibrillation, CVA, diabetes presents to the emergency department today for evaluation of headache. She reports that her headache started at 7 AM while she was getting her grandson ready for school. The headache is sharp and worse on her right side. It has been gradually worsening since onset. She denies any numbness or weakness. She denies any speech difficulty or visual disturbance. Patient reports she is very concerned because she has history of 2 prior strokes which she felt were asymptomatic, but found on MRI. She is prescribed Plavix and aspirin. She has been out and has not taken either of these medications in the past 3 months.   Patient is a 58 y.o. female presenting with headaches. The history is provided by the patient. No language interpreter was used.  Headache Associated symptoms: no abdominal pain, no fever, no nausea, no numbness and no vomiting     Past Medical History  Diagnosis Date  . Hypertension   . Hypertension     Hypertensive urgency 05/2006  . Atrial fibrillation   . Hypercholesteremia   . 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 Diabetes mellitus     type 2   Past Surgical History  Procedure Laterality Date  . Tonsilectomy, adenoidectomy, bilateral myringotomy and tubes  age 48  . Dilation and curettage of uterus  1976  . Meniscus repair  03/09    right knee  . Tonsillectomy     Family History  Problem Relation Age of Onset  . Heart failure Mother   . Diabetes Father   . Hypertension Father   . Lung cancer Sister    History  Substance Use Topics  . Smoking status:  Never Smoker   . Smokeless tobacco: Never Used  . Alcohol Use: No   OB History   Grav Para Term Preterm Abortions TAB SAB Ect Mult Living                 Review of Systems  Constitutional: Negative for fever and chills.  Respiratory: Negative for chest tightness and shortness of breath.   Cardiovascular: Negative for chest pain.  Gastrointestinal: Negative for nausea, vomiting and abdominal pain.  Genitourinary: Positive for frequency.  Neurological: Positive for headaches. Negative for weakness and numbness.  All other systems reviewed and are negative.     Allergies  Propoxyphene n-acetaminophen  Home Medications   Prior to Admission medications   Medication Sig Start Date End Date Taking? Authorizing Provider  amLODipine (NORVASC) 10 MG tablet Take 1 tablet (10 mg total) by mouth daily. 02/16/13   Kandis Nab, MD  aspirin 81 MG tablet Take 81 mg by mouth at bedtime.     Historical Provider, MD  B-D ULTRAFINE III SHORT PEN 31G X 8 MM MISC 1 each See admin instructions. Use with insulin 11/18/13   Historical Provider, MD  carvedilol (COREG) 25 MG tablet Take 1 tablet (25 mg total) by mouth 2 (two) times daily with a meal. 03/29/14   Sharon [address], MD  chlorthalidone (HYGROTON) 25 MG tablet Take 1 tablet (25 mg total) by mouth daily. Take at 12:00 noon daily 09/15/13  Zigmund Gottron, MD  cloNIDine (CATAPRES) 0.2 MG tablet Take 0.2 mg by mouth 2 (two) times daily.  09/15/13   Zigmund Gottron, MD  clopidogrel (PLAVIX) 75 MG tablet Take 1 tablet (75 mg total) by mouth daily. 03/29/14   [place], MD  cyclobenzaprine (FLEXERIL) 10 MG tablet Take 1 tablet (10 mg total) by mouth 3 (three) times daily as needed for muscle spasms. 11/24/13   Zigmund Gottron, MD  glucose blood (ONE TOUCH ULTRA TEST) test strip Check blood sugar three times daily. QS for 1 month supply. Dx 250.02 04/17/14   Zigmund Gottron, MD  insulin aspart (NOVOLOG) 100 UNIT/ML  injection Inject 10 Units into the skin daily.    Historical Provider, MD  Insulin Glargine (LANTUS) 100 UNIT/ML Solostar Pen Inject 55 Units into the skin daily. 12/22/13   Kandis Nab, MD  lisinopril (PRINIVIL,ZESTRIL) 40 MG tablet Take 1 tablet (40 mg total) by mouth daily. 11/26/12   [place], DO  metFORMIN (GLUCOPHAGE) 1000 MG tablet Take 1 tablet (1,000 mg total) by mouth 2 (two) times daily with a meal. 10/31/13   Kandis Nab, MD  nortriptyline (PAMELOR) 10 MG capsule Take 1 capsule (10 mg total) by mouth at bedtime. 02/21/14   Adam Melvern Sample, DO  pantoprazole (PROTONIX) 40 MG tablet Take 40 mg by mouth at bedtime.    Historical Provider, MD  rosuvastatin (CRESTOR) 10 MG tablet Take 1 tablet (10 mg total) by mouth at bedtime. 04/17/14   Zigmund Gottron, MD  spironolactone (ALDACTONE) 25 MG tablet Take 1 tablet (25 mg total) by mouth daily. 11/24/13   Zigmund Gottron, MD   BP 184/127  Pulse 78  Temp(Src) 97.3 F (36.3 C) (Oral)  Resp 20  Ht 5\' 2"  (1.575 m)  Wt 279 lb (126.554 kg)  BMI 51.02 kg/m2  SpO2 100% Physical Exam  Nursing note and vitals reviewed. Constitutional: She is oriented to person, place, and time. She appears well-developed and well-nourished. No distress.  HENT:  Head: Normocephalic and atraumatic.  Right Ear: External ear normal.  Left Ear: External ear normal.  Nose: Nose normal.  Mouth/Throat: Oropharynx is clear and moist.  Eyes: Conjunctivae and EOM are normal. Pupils are equal, round, and reactive to light.  Neck: Normal range of motion.  No nuchal rigidity or meningeal signs  Cardiovascular: Normal rate, regular rhythm, normal heart sounds, intact distal pulses and normal pulses.   Pulses:      Radial pulses are 2+ on the right side, and 2+ on the left side.       Posterior tibial pulses are 2+ on the right side, and 2+ on the left side.  Pulmonary/Chest: Effort normal and breath sounds normal. No stridor. No respiratory  distress. She has no wheezes. She has no rales.  Abdominal: Soft. She exhibits no distension.  Musculoskeletal: Normal range of motion.  Neurological: She is alert and oriented to person, place, and time. She has normal strength. GCS eye subscore is 4. GCS verbal subscore is 5. GCS motor subscore is 6.  Grip strength 5 out of 5 bilaterally Finger-nose-finger normal, rapid alternating movements normal, heel-knee-shin normal Normal gait No facial droop  Skin: Skin is warm and dry. She is not diaphoretic. No erythema.  Psychiatric: She has a normal mood and affect. Her behavior is normal.    ED Course  Procedures (including critical care time) Labs Review Labs Reviewed  APTT - Abnormal; Notable for the  following:    aPTT 22 (*)    All other components within normal limits  CBC - Abnormal; Notable for the following:    RBC 5.46 (*)    MCV 71.1 (*)    MCH 23.3 (*)    All other components within normal limits  DIFFERENTIAL - Abnormal; Notable for the following:    Lymphocytes Relative 48 (*)    All other components within normal limits  COMPREHENSIVE METABOLIC PANEL - Abnormal; Notable for the following:    Glucose, Bld 257 (*)    Alkaline Phosphatase 128 (*)    Total Bilirubin 0.2 (*)    Anion gap 16 (*)    All other components within normal limits  URINALYSIS, ROUTINE W REFLEX MICROSCOPIC - Abnormal; Notable for the following:    APPearance CLOUDY (*)    Glucose, UA 100 (*)    Hgb urine dipstick SMALL (*)    Nitrite POSITIVE (*)    Leukocytes, UA LARGE (*)    All other components within normal limits  URINE MICROSCOPIC-ADD ON - Abnormal; Notable for the following:    Squamous Epithelial / LPF FEW (*)    Bacteria, UA FEW (*)    All other components within normal limits  CBG MONITORING, ED - Abnormal; Notable for the following:    Glucose-Capillary 249 (*)    All other components within normal limits  I-STAT CHEM 8, ED - Abnormal; Notable for the following:    Glucose, Bld  269 (*)    All other components within normal limits  ETHANOL  PROTIME-INR  URINE RAPID DRUG SCREEN ([place])  I-STAT TROPOININ, ED  Randolm Idol, ED    Imaging Review Mr Brain Wo Contrast  06/02/2014   CLINICAL DATA:  Right-sided headaches.  Personal history of strokes.  EXAM: MRI HEAD WITHOUT CONTRAST  TECHNIQUE: Multiplanar, multiecho pulse sequences of the brain and surrounding structures were obtained without intravenous contrast.  COMPARISON:  CT head without contrast 01/28/2014. MRI brain 08/16/2008  FINDINGS: The diffusion-weighted images demonstrate no acute or subacute infarct. Remote hemorrhagic infarcts are evident within the basal ganglia bilaterally. Multiple other punctate foci of susceptibility are evident within the temporal and inferior parietal lobes bilaterally. Periventricular and subcortical white matter disease has progressed bilaterally. Remote brainstem infarcts are evident.  Flow is present in the major intracranial arteries. The globes and orbits are intact. The paranasal sinuses and mastoid air cells are clear.  IMPRESSION: 1. Progression of diffuse periventricular and subcortical white matter disease bilaterally, compatible with chronic microvascular ischemia. 2. Multiple remote hemorrhagic lacunar infarcts within the basal ganglia. 3. Additional foci of susceptibility within the temporal and inferior parietal lobes bilaterally. This raises concern for a vasculitis, specifically amyloid angiopathy. 4. No acute intracranial abnormality. 5. Remote infarcts of the thalami bilaterally as well as the brainstem.   Electronically Signed   By: Lawrence Santiago [name]   On: 06/02/2014 10:27     EKG Interpretation   Date/Time:  Friday June 02 2014 07:53:15 EDT Ventricular Rate:  70 PR Interval:  164 QRS Duration: 86 QT Interval:  397 QTC Calculation: 428 R Axis:   9 Text Interpretation:  Sinus rhythm Low voltage, precordial leads Consider  anterior infarct  Baseline wander in lead(s) II aVF since last tracing no  significant change Confirmed by Eulis Foster  MD, Leisl Spurrier ([id number]) on 06/02/2014  8:27:06 AM      MDM   Final diagnoses:  Headache  UTI (lower urinary tract infection)    Patient presents  to emergency department for evaluation of headache. She has history of prior strokes and is concerned that she is having subtle stroke. MRI negative for acute pathology. Patient feels improved after 8 mg of morphine. Patient also found to have urinary tract infection. Patient with urinary frequency, but she insists on her diabetes. Will treat with Keflex. Patient is to follow-up with primary care physician. Discussed reasons to return to emergency department immediately. Vital signs stable for discharge. Dr. Eulis Foster evaluated patient and agrees with plan. Patient / Family / Caregiver informed of clinical course, understand medical decision-making process, and agree with plan.   Elwyn Lade, PA-C 06/02/14 [place], MD 06/04/14 [phone]

## 2014-06-02 NOTE — ED Notes (Signed)
Jarrett Soho, PA at the bedside, CBG of 249

## 2014-06-02 NOTE — ED Notes (Signed)
Pt transported to MRI 

## 2014-06-02 NOTE — ED Notes (Signed)
IV team at the bedside. 

## 2014-06-02 NOTE — ED Provider Notes (Signed)
  Face-to-face evaluation   History: She complains of a headache that started this morning. She also complains of decreased appetite and fatigue for several days. She denies dysuria.  Physical exam: Obese, alert, cooperative. Normal upper extremity strength. No cranial nerve deficit. No respiratory distress.  Medical screening examination/treatment/procedure(s) were conducted as a shared visit with non-physician practitioner(s) and myself.  I personally evaluated the patient during the encounter  Richarda Blade, MD 06/04/14 1428

## 2014-06-02 NOTE — ED Notes (Addendum)
Pt states she got a pain on the right side of her head when getting grandchild ready for school this morning. Has had two previous strokes. Pt is diabetic. Grips equal and strong. No facial droop. Pt states she has been out of her plavix for a couple months and hasn't had any aspirins either. Pt states her daughter told her yesterday her face was drooping.

## 2014-06-02 NOTE — ED Notes (Signed)
Pt amb with PA without any difficulty.

## 2014-06-02 NOTE — ED Notes (Signed)
IV team paged.  

## 2014-06-02 NOTE — Discharge Instructions (Signed)
General Headache Without Cause °A headache is pain or discomfort felt around the head or neck area. The specific cause of a headache may not be found. There are many causes and types of headaches. A few common ones are: °· Tension headaches. °· Migraine headaches. °· Cluster headaches. °· Chronic daily headaches. °HOME CARE INSTRUCTIONS  °· Keep all follow-up appointments with your caregiver or any specialist referral. °· Only take over-the-counter or prescription medicines for pain or discomfort as directed by your caregiver. °· Lie down in a dark, quiet room when you have a headache. °· Keep a headache journal to find out what may trigger your migraine headaches. For example, write down: °¨ What you eat and drink. °¨ How much sleep you get. °¨ Any change to your diet or medicines. °· Try massage or other relaxation techniques. °· Put ice packs or heat on the head and neck. Use these 3 to 4 times per day for 15 to 20 minutes each time, or as needed. °· Limit stress. °· Sit up straight, and do not tense your muscles. °· Quit smoking if you smoke. °· Limit alcohol use. °· Decrease the amount of caffeine you drink, or stop drinking caffeine. °· Eat and sleep on a regular schedule. °· Get 7 to 9 hours of sleep, or as recommended by your caregiver. °· Keep lights dim if bright lights bother you and make your headaches worse. °SEEK MEDICAL CARE IF:  °· You have problems with the medicines you were prescribed. °· Your medicines are not working. °· You have a change from the usual headache. °· You have nausea or vomiting. °SEEK IMMEDIATE MEDICAL CARE IF:  °· Your headache becomes severe. °· You have a fever. °· You have a stiff neck. °· You have loss of vision. °· You have muscular weakness or loss of muscle control. °· You start losing your balance or have trouble walking. °· You feel faint or pass out. °· You have severe symptoms that are different from your first symptoms. °MAKE SURE YOU:  °· Understand these  instructions. °· Will watch your condition. °· Will get help right away if you are not doing well or get worse. °Document Released: 07/21/2005 Document Revised: 10/13/2011 Document Reviewed: 08/06/2011 °ExitCare® Patient Information ©2015 [employer]. This information is not intended to replace advice given to you by your health care provider. Make sure you discuss any questions you have with your health care provider. ° °Urinary Tract Infection °Urinary tract infections (UTIs) can develop anywhere along your urinary tract. Your urinary tract is your body's drainage system for removing wastes and extra water. Your urinary tract includes two kidneys, two ureters, a bladder, and a urethra. Your kidneys are a pair of bean-shaped organs. Each kidney is about the size of your fist. They are located below your ribs, one on each side of your spine. °CAUSES °Infections are caused by microbes, which are microscopic organisms, including fungi, viruses, and bacteria. These organisms are so small that they can only be seen through a microscope. Bacteria are the microbes that most commonly cause UTIs. °SYMPTOMS  °Symptoms of UTIs may vary by age and gender of the patient and by the location of the infection. Symptoms in young women typically include a frequent and intense urge to urinate and a painful, burning feeling in the bladder or urethra during urination. Older women and men are more likely to be tired, shaky, and weak and have muscle aches and abdominal pain. A fever may mean the   infection is in your kidneys. Other symptoms of a kidney infection include pain in your back or sides below the ribs, nausea, and vomiting. °DIAGNOSIS °To diagnose a UTI, your caregiver will ask you about your symptoms. Your caregiver also will ask to provide a urine sample. The urine sample will be tested for bacteria and white blood cells. White blood cells are made by your body to help fight infection. °TREATMENT  °Typically, UTIs can be  treated with medication. Because most UTIs are caused by a bacterial infection, they usually can be treated with the use of antibiotics. The choice of antibiotic and length of treatment depend on your symptoms and the type of bacteria causing your infection. °HOME CARE INSTRUCTIONS °· If you were prescribed antibiotics, take them exactly as your caregiver instructs you. Finish the medication even if you feel better after you have only taken some of the medication. °· Drink enough water and fluids to keep your urine clear or pale yellow. °· Avoid caffeine, tea, and carbonated beverages. They tend to irritate your bladder. °· Empty your bladder often. Avoid holding urine for long periods of time. °· Empty your bladder before and after sexual intercourse. °· After a bowel movement, women should cleanse from front to back. Use each tissue only once. °SEEK MEDICAL CARE IF:  °· You have back pain. °· You develop a fever. °· Your symptoms do not begin to resolve within 3 days. °SEEK IMMEDIATE MEDICAL CARE IF:  °· You have severe back pain or lower abdominal pain. °· You develop chills. °· You have nausea or vomiting. °· You have continued burning or discomfort with urination. °MAKE SURE YOU:  °· Understand these instructions. °· Will watch your condition. °· Will get help right away if you are not doing well or get worse. °Document Released: 04/30/2005 Document Revised: 01/20/2012 Document Reviewed: 08/29/2011 °ExitCare® Patient Information ©2015 [employer]. This information is not intended to replace advice given to you by your health care provider. Make sure you discuss any questions you have with your health care provider. ° °

## 2014-06-17 ENCOUNTER — Encounter (HOSPITAL_COMMUNITY): Payer: Self-pay | Admitting: Nurse Practitioner

## 2014-06-17 ENCOUNTER — Emergency Department (HOSPITAL_COMMUNITY): Payer: Medicare HMO

## 2014-06-17 ENCOUNTER — Emergency Department (HOSPITAL_COMMUNITY)
Admission: EM | Admit: 2014-06-17 | Discharge: 2014-06-17 | Disposition: A | Discharge status: Home / Self Care | Payer: Medicare HMO | Attending: Emergency Medicine | Admitting: Emergency Medicine

## 2014-06-17 DIAGNOSIS — R0789 Other chest pain: Secondary | ICD-10-CM | POA: Diagnosis not present

## 2014-06-17 DIAGNOSIS — R6889 Other general symptoms and signs: Secondary | ICD-10-CM

## 2014-06-17 DIAGNOSIS — R059 Cough, unspecified: Secondary | ICD-10-CM

## 2014-06-17 DIAGNOSIS — Z7982 Long term (current) use of aspirin: Secondary | ICD-10-CM | POA: Diagnosis not present

## 2014-06-17 DIAGNOSIS — I10 Essential (primary) hypertension: Secondary | ICD-10-CM | POA: Diagnosis not present

## 2014-06-17 DIAGNOSIS — Z7902 Long term (current) use of antithrombotics/antiplatelets: Secondary | ICD-10-CM | POA: Diagnosis not present

## 2014-06-17 DIAGNOSIS — Z794 Long term (current) use of insulin: Secondary | ICD-10-CM | POA: Diagnosis not present

## 2014-06-17 DIAGNOSIS — I4891 Unspecified atrial fibrillation: Secondary | ICD-10-CM | POA: Diagnosis not present

## 2014-06-17 DIAGNOSIS — Z8673 Personal history of transient ischemic attack (TIA), and cerebral infarction without residual deficits: Secondary | ICD-10-CM | POA: Diagnosis not present

## 2014-06-17 DIAGNOSIS — R0981 Nasal congestion: Secondary | ICD-10-CM | POA: Diagnosis not present

## 2014-06-17 DIAGNOSIS — M791 Myalgia: Secondary | ICD-10-CM | POA: Diagnosis not present

## 2014-06-17 DIAGNOSIS — R05 Cough: Secondary | ICD-10-CM | POA: Diagnosis not present

## 2014-06-17 DIAGNOSIS — R6883 Chills (without fever): Secondary | ICD-10-CM | POA: Diagnosis not present

## 2014-06-17 DIAGNOSIS — E119 Type 2 diabetes mellitus without complications: Secondary | ICD-10-CM | POA: Diagnosis not present

## 2014-06-17 DIAGNOSIS — Z792 Long term (current) use of antibiotics: Secondary | ICD-10-CM | POA: Diagnosis not present

## 2014-06-17 DIAGNOSIS — Z79899 Other long term (current) drug therapy: Secondary | ICD-10-CM | POA: Diagnosis not present

## 2014-06-17 DIAGNOSIS — R0602 Shortness of breath: Secondary | ICD-10-CM | POA: Diagnosis present

## 2014-06-17 LAB — BASIC METABOLIC PANEL
Anion gap: 19 — ABNORMAL HIGH (ref 5–15)
BUN: 12 mg/dL (ref 6–23)
CALCIUM: 9.6 mg/dL (ref 8.4–10.5)
CO2: 20 mEq/L (ref 19–32)
CREATININE: 0.82 mg/dL (ref 0.50–1.10)
Chloride: 99 mEq/L (ref 96–112)
GFR calc non Af Amer: 77 mL/min — ABNORMAL LOW (ref >90)
GFR, EST AFRICAN AMERICAN: 89 mL/min — AB (ref >90)
Glucose, Bld: 249 mg/dL — ABNORMAL HIGH (ref 70–99)
Potassium: 4.1 mEq/L (ref 3.7–5.3)
Sodium: 138 mEq/L (ref 137–147)

## 2014-06-17 LAB — CBC
HCT: 41.5 % (ref 36.0–46.0)
Hemoglobin: 13.5 g/dL (ref 12.0–15.0)
MCH: 23.1 pg — AB (ref 26.0–34.0)
MCHC: 32.5 g/dL (ref 30.0–36.0)
MCV: 71.1 fL — AB (ref 78.0–100.0)
Platelets: 333 10*3/uL (ref 150–400)
RBC: 5.84 MIL/uL — AB (ref 3.87–5.11)
RDW: 14.9 % (ref 11.5–15.5)
WBC: 8.4 10*3/uL (ref 4.0–10.5)

## 2014-06-17 LAB — PRO B NATRIURETIC PEPTIDE: Pro B Natriuretic peptide (BNP): 169 pg/mL — ABNORMAL HIGH (ref 0–125)

## 2014-06-17 LAB — I-STAT TROPONIN, ED: Troponin i, poc: 0.01 ng/mL (ref 0.00–0.08)

## 2014-06-17 MED ORDER — OSELTAMIVIR PHOSPHATE 75 MG PO CAPS: 75.0000 mg | ORAL_CAPSULE | Freq: Two times a day (BID) | ORAL | Status: DC

## 2014-06-17 MED ORDER — DM-GUAIFENESIN ER 30-600 MG PO TB12: 1.0000 | ORAL_TABLET | Freq: Two times a day (BID) | ORAL | Status: DC

## 2014-06-17 MED ORDER — HYDROCODONE-ACETAMINOPHEN 5-325 MG PO TABS: 1.0000 | ORAL_TABLET | Freq: Four times a day (QID) | ORAL | Status: DC | PRN

## 2014-06-17 MED ORDER — HYDROCODONE-ACETAMINOPHEN 5-325 MG PO TABS
1.0000 | ORAL_TABLET | Freq: Once | ORAL | Status: AC
  Administered 2014-06-17: 1 via ORAL
  Filled 2014-06-17: qty 1

## 2014-06-17 NOTE — ED Provider Notes (Signed)
CSN: [id number]     Arrival date & time 06/17/14  1240 History   First MD Initiated Contact with Patient 06/17/14 1354     Chief Complaint  Patient presents with  . Shortness of Breath     (Consider location/radiation/quality/duration/timing/severity/associated sxs/prior Treatment) Patient is a 58 y.o. female presenting with shortness of breath. The history is provided by the patient.  Shortness of Breath Associated symptoms: chest pain and cough   Associated symptoms: no abdominal pain, no fever, no headaches, no rash, no sore throat and no vomiting   patient with onset of flulike symptoms last evening. Cough chills feeling short of breath. Bodyaches all over. No significant sore throat. Nonproductive cough. Some congestion. [facility] is [facility]. Patient has a history of diabetes and hypertension. Patient has no significant chest pain other than the bodyaches.patient did not have the flu shot.  Past Medical History  Diagnosis Date  . Hypertension   . Hypertension     Hypertensive urgency 05/2006  . Atrial fibrillation   . Hypercholesteremia   . 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 Diabetes mellitus     type 2   Past Surgical History  Procedure Laterality Date  . Tonsilectomy, adenoidectomy, bilateral myringotomy and tubes  age 56  . Dilation and curettage of uterus  1976  . Meniscus repair  03/09    right knee  . Tonsillectomy     Family History  Problem Relation Age of Onset  . Heart failure Mother   . Diabetes Father   . Hypertension Father   . Lung cancer Sister    History  Substance Use Topics  . Smoking status: Never Smoker   . Smokeless tobacco: Never Used  . Alcohol Use: No   OB History    No data available     Review of Systems  Constitutional: Positive for chills. Negative for fever.  HENT: Positive for congestion. Negative for sore throat.   Eyes: Negative for visual disturbance.  Respiratory:  Positive for cough and shortness of breath.   Cardiovascular: Positive for chest pain.  Gastrointestinal: Negative for nausea, vomiting and abdominal pain.  Genitourinary: Negative for dysuria and hematuria.  Musculoskeletal: Positive for myalgias.  Skin: Negative for rash.  Neurological: Negative for headaches.  Hematological: Does not bruise/bleed easily.  Psychiatric/Behavioral: Negative for confusion.      Allergies  Propoxyphene n-acetaminophen  Home Medications   Prior to Admission medications   Medication Sig Start Date End Date Taking? Authorizing Provider  amLODipine (NORVASC) 10 MG tablet Take 1 tablet (10 mg total) by mouth daily. 02/16/13  Yes Kandis Nab, MD  aspirin EC 81 MG tablet Take 81 mg by mouth daily.   Yes Historical Provider, MD  carvedilol (COREG) 25 MG tablet Take 1 tablet (25 mg total) by mouth 2 (two) times daily with a meal. 03/29/14  Yes Sharon Mt [address], MD  cephALEXin (KEFLEX) 500 MG capsule Take 1 capsule (500 mg total) by mouth 4 (four) times daily. 06/02/14  Yes Richarda Blade, MD  chlorthalidone (HYGROTON) 25 MG tablet Take 1 tablet (25 mg total) by mouth daily. Take at 12:00 noon daily 09/15/13  Yes Zigmund Gottron, MD  cloNIDine (CATAPRES) 0.2 MG tablet Take 0.2 mg by mouth 2 (two) times daily.  09/15/13  Yes Zigmund Gottron, MD  clopidogrel (PLAVIX) 75 MG tablet Take 75 mg by mouth daily. 05/03/14  Yes Historical Provider, MD  CRESTOR 10 MG tablet Take 10 mg by mouth daily. 05/03/14  Yes Historical Provider, MD  insulin aspart (NOVOLOG) 100 UNIT/ML injection Inject 10 Units into the skin daily.   Yes Historical Provider, MD  Insulin Glargine (LANTUS) 100 UNIT/ML Solostar Pen Inject 55 Units into the skin daily. 12/22/13  Yes Kandis Nab, MD  lisinopril (PRINIVIL,ZESTRIL) 40 MG tablet Take 1 tablet (40 mg total) by mouth daily. 11/26/12  Yes [place], DO  metFORMIN (GLUCOPHAGE) 1000 MG tablet Take 1 tablet (1,000 mg total)  by mouth 2 (two) times daily with a meal. 10/31/13  Yes Kandis Nab, MD  pantoprazole (PROTONIX) 40 MG tablet Take 40 mg by mouth at bedtime.   Yes Historical Provider, MD  spironolactone (ALDACTONE) 25 MG tablet Take 1 tablet (25 mg total) by mouth daily. 11/24/13  Yes Zigmund Gottron, MD  B-D ULTRAFINE III SHORT PEN 31G X 8 MM MISC 1 each See admin instructions. Use with insulin 11/18/13   Historical Provider, MD  dextromethorphan-guaiFENesin (MUCINEX DM) 30-600 MG per 12 hr tablet Take 1 tablet by mouth 2 (two) times daily. 06/17/14   Fredia Sorrow, MD  glucose blood (ONE TOUCH ULTRA TEST) test strip Check blood sugar three times daily. QS for 1 month supply. Dx 250.02 04/17/14   Zigmund Gottron, MD  HYDROcodone-acetaminophen (NORCO/VICODIN) 5-325 MG per tablet Take 1-2 tablets by mouth every 6 (six) hours as needed for moderate pain. 06/17/14   Fredia Sorrow, MD  nortriptyline (PAMELOR) 10 MG capsule Take 1 capsule (10 mg total) by mouth at bedtime. Patient not taking: Reported on 06/17/2014 02/21/14   Dudley Major, DO  oseltamivir (TAMIFLU) 75 MG capsule Take 1 capsule (75 mg total) by mouth every 12 (twelve) hours. 06/17/14   Fredia Sorrow, MD   BP 202/112 mmHg  Pulse 73  Temp(Src) 98 F (36.7 C) (Oral)  Resp 14  SpO2 99% Physical Exam  Constitutional: She is oriented to person, place, and time. She appears well-developed and well-nourished. No distress.  HENT:  Head: Normocephalic and atraumatic.  Mouth/Throat: Oropharynx is clear and moist. No oropharyngeal exudate.  Eyes: Conjunctivae are normal. Pupils are equal, round, and reactive to light.  Neck: Normal range of motion. Neck supple.  Cardiovascular: Normal rate, regular rhythm and normal heart sounds.   Pulmonary/Chest: Breath sounds normal. No respiratory distress. She has no wheezes. She has no rales.  Abdominal: Soft. Bowel sounds are normal. There is no tenderness.  Musculoskeletal: Normal range of  motion.  Neurological: She is alert and oriented to person, place, and time. No cranial nerve deficit. Coordination normal.  Skin: Skin is warm. No rash noted.  Nursing note and vitals reviewed.   ED Course  Procedures (including critical care time) Labs Review Labs Reviewed  BASIC METABOLIC PANEL - Abnormal; Notable for the following:    Glucose, Bld 249 (*)    GFR calc non Af Amer 77 (*)    GFR calc Af Amer 89 (*)    Anion gap 19 (*)    All other components within normal limits  CBC - Abnormal; Notable for the following:    RBC 5.84 (*)    MCV 71.1 (*)    MCH 23.1 (*)    All other components within normal limits  PRO B NATRIURETIC PEPTIDE - Abnormal; Notable for the following:    Pro B Natriuretic peptide (BNP) 169.0 (*)    All other components within normal limits  Randolm Idol, ED  Results for orders placed or performed during the hospital encounter of 53/29/92  Basic metabolic panel  Result Value Ref Range   Sodium 138 137 - 147 mEq/L   Potassium 4.1 3.7 - 5.3 mEq/L   Chloride 99 96 - 112 mEq/L   CO2 20 19 - 32 mEq/L   Glucose, Bld 249 (H) 70 - 99 mg/dL   BUN 12 6 - 23 mg/dL   Creatinine, Ser 0.82 0.50 - 1.10 mg/dL   Calcium 9.6 8.4 - 10.5 mg/dL   GFR calc non Af Amer 77 (L) >90 mL/min   GFR calc Af Amer 89 (L) >90 mL/min   Anion gap 19 (H) 5 - 15  CBC  Result Value Ref Range   WBC 8.4 4.0 - 10.5 K/uL   RBC 5.84 (H) 3.87 - 5.11 MIL/uL   Hemoglobin 13.5 12.0 - 15.0 g/dL   HCT 41.5 36.0 - 46.0 %   MCV 71.1 (L) 78.0 - 100.0 fL   MCH 23.1 (L) 26.0 - 34.0 pg   MCHC 32.5 30.0 - 36.0 g/dL   RDW 14.9 11.5 - 15.5 %   Platelets 333 150 - 400 K/uL  BNP (order ONLY if patient complains of dyspnea/SOB AND you have documented it for THIS visit)  Result Value Ref Range   Pro B Natriuretic peptide (BNP) 169.0 (H) 0 - 125 pg/mL  I-stat troponin, ED (not at [facility])  Result Value Ref Range   Troponin i, poc 0.01 0.00 - 0.08 ng/mL   Comment 3             Imaging  Review Dg Chest 2 View  06/17/2014   CLINICAL DATA:  Productive cough, shortness of breath  EXAM: CHEST  2 VIEW  COMPARISON:  07/10/2013  FINDINGS: The heart size and mediastinal contours are within normal limits. Both lungs are clear. The visualized skeletal structures are unremarkable.  IMPRESSION: No active cardiopulmonary disease.   Electronically Signed   By: Inez Catalina [name]   On: 06/17/2014 15:18     EKG Interpretation None      MDM   Final diagnoses:  Flu-like symptoms    Patient symptoms seem to be most consistent with flu like illness. Will treat with Tamiflu Mucinex DM and pain medicine for the bodyaches. No signs of pneumonia. Blood glucose is elevated some but no signs of significant acidosis. No leukocytosis. Troponin is not elevated symptoms do not seem to be consistent with cardiac chest pain. BNP is not elevated much at all. Patient is followed by family practice and will follow-up this week. Patient blood pressure is also elevated here she will need close follow-up to make sure that that remains under control or gets under control. No hypertensive emergency as of today.    Fredia Sorrow, MD 06/17/14 [phone]

## 2014-06-17 NOTE — ED Notes (Signed)
She reports cough, chills and feeling SOB last night. States her body aches all over. A&Ox4, able to speak in complete sentences

## 2014-06-17 NOTE — ED Notes (Signed)
Pt is in a gown and on the monitor. 

## 2014-06-17 NOTE — Discharge Instructions (Signed)
Chest x-ray negative for pneumonia. Basic labs without significant abnormalities. Blood sugar up a little bit. Symptoms seem to be consistent with the flu. Take Tamiflu as directed. Take Mucinex DM as needed for cough and congestion. Take the hydrocodone as needed. Make an appointment to follow-up with your doctor. Return for any new or worse symptoms.

## 2014-06-21 ENCOUNTER — Encounter: Payer: Self-pay | Admitting: Family Medicine

## 2014-06-21 ENCOUNTER — Ambulatory Visit (INDEPENDENT_AMBULATORY_CARE_PROVIDER_SITE_OTHER): Payer: Commercial Managed Care - HMO | Admitting: Family Medicine

## 2014-06-21 VITALS — BP 178/99 | HR 67 | Temp 98.4°F | Ht 62.0 in | Wt 283.0 lb

## 2014-06-21 DIAGNOSIS — J111 Influenza due to unidentified influenza virus with other respiratory manifestations: Secondary | ICD-10-CM

## 2014-06-21 MED ORDER — HYDROCODONE-ACETAMINOPHEN 5-325 MG PO TABS: 1.0000 | ORAL_TABLET | Freq: Four times a day (QID) | ORAL | Status: DC | PRN

## 2014-06-21 NOTE — Progress Notes (Signed)
   Subjective:    Patient ID: [name], female    DOB: Dec 15, 1955, 58 y.o.   MRN: [id number]  HPI: Pt presents to clinic for ED f/u for flu-like symptoms, now present for about 5 days. She was given Rx for Tamiflu, Norco, and Mucinex DM; she hasn't noticed much help, yet. The Norco does help some with her body aches and helps her sleep. She reports chills at night, body aches, cough (greenish phlegm), post-tussive pain but without frank SOB.   She takes her regular medications for DM, HTN, HLD, without any recent changes. She has no known sick contacts.  Review of Systems: As above. Denies frank fever, N/V, chest pain. Does have very poor appetite but can keep soup down.     Objective:   Physical Exam BP 178/99 mmHg  Pulse 67  Temp(Src) 98.4 F (36.9 C) (Oral)  Ht 5\' 2"  (1.575 m)  Wt 283 lb (128.368 kg)  BMI 51.75 kg/m2 Gen: ill-appearing adult female in NAD HEENT: [place]/AT, EOMI, PERRLA, TM's clear bilaterally  MM slightly dry, posterior oropharynx mildly red without tonsillar exudate  Few anterior cervical lymph nodes Cardio: RRR, no murmurs Pulm: CTAB, no wheezes Abd: soft, nontender, BS+ Ext: warm, well-perfused, no LE edema     Assessment & Plan:  58yo female with influenza, already on Tamiflu with symptomatic management with Mucinex-DM and Norco for body aches, cough, and sore throat - continue Tamiflu until completion - continue Mucinex DM and Norco PRN - reviewed red flags that would prompt immediate return to care - f/u PRN, otherwise  Emmaline Kluver, MD PGY-3, [facility] 06/21/2014, 9:50 PM

## 2014-06-21 NOTE — Patient Instructions (Signed)
Thank you for coming in, today!  The medicines you have will help your symptoms. Hydrocodone with Tylenol will help with cough, body aches, and sleep. Mucinex-DM will help with cough and congestion. Tamiflu won't make your symptoms better, but will hopefully make you feel better sooner. I will give you another prescription for hydrocodone plus acetaminophen (Tylenol) in case you run out. If you end up not needing it, you can discard prescription without filling it.  If you have worse symptoms, trouble breathing, persistent high fever (over 101), or if you can't keep fluids down, call or come back sooner, or go to the ermegency room. Otherwise, if you're not feeling better by the beginning of December, call or come back to see me.  Please feel free to call with any questions or concerns at any time, at [phone]. --Dr. Venetia Maxon

## 2014-07-13 ENCOUNTER — Ambulatory Visit (INDEPENDENT_AMBULATORY_CARE_PROVIDER_SITE_OTHER): Payer: Commercial Managed Care - HMO | Admitting: Pharmacist

## 2014-07-13 ENCOUNTER — Encounter: Payer: Self-pay | Admitting: Pharmacist

## 2014-07-13 VITALS — BP 162/92 | Ht 62.45 in | Wt 257.0 lb

## 2014-07-13 DIAGNOSIS — E1165 Type 2 diabetes mellitus with hyperglycemia: Secondary | ICD-10-CM

## 2014-07-13 DIAGNOSIS — IMO0002 Reserved for concepts with insufficient information to code with codable children: Secondary | ICD-10-CM

## 2014-07-13 MED ORDER — ROSUVASTATIN CALCIUM 10 MG PO TABS: 10.0000 mg | ORAL_TABLET | Freq: Every day | ORAL | Status: DC

## 2014-07-13 MED ORDER — INSULIN GLARGINE 100 UNIT/ML SOLOSTAR PEN: 60.0000 [IU] | PEN_INJECTOR | Freq: Every day | SUBCUTANEOUS | Status: DC

## 2014-07-13 MED ORDER — NYSTATIN EX POWD: CUTANEOUS | Status: DC

## 2014-07-13 MED ORDER — PANTOPRAZOLE SODIUM 40 MG PO TBEC: 40.0000 mg | DELAYED_RELEASE_TABLET | Freq: Every day | ORAL | Status: DC

## 2014-07-13 NOTE — Patient Instructions (Addendum)
Please follow up with a dentist soon!  Take Lantus 60 units once daily at night.   Take Novolog 15 units three times daily.  Take your Glucophage twice daily.  Take care of yourself including getting more sleep.   Next visit with Dr. Venetia Maxon.  In 2 weeks.

## 2014-07-13 NOTE — Assessment & Plan Note (Signed)
Diabetes longstanding currently worsened control likely related to stress of minimal rest due to taking care of multiple young children, having dental pain with possible abscess.   Denies hypoglycemic events and is able to verbalize appropriate hypoglycemia management plan.  Reports some non-adherence with medication.  Encouraged her to please follow up with a dentist soon.  Several changes to drug therapy were instructed including;  Increase Lantus from 50 to 60 units each night.  Increase Novolog by taking all three doses per day. Increase frequency of adherence with metformin to 1000mg  twice daily.  Patient verbalized understanding of these instructions.  Written patient instructions provided.  Follow up in Pharmacist Clinic Visit after next visit in a few weeks with Dr. Venetia Maxon.    Total time in face to face counseling 35 minutes.   Patient was NOT interested in reordering test strips, DM supplies from [facility].  States she has adequate supply of strips at this time.

## 2014-07-13 NOTE — Assessment & Plan Note (Signed)
Hypertension with elevated BP in office today.  Reports adherence to all BP medications.   Stress including possible tooth abscess may be contributing to pain and elevated BP.   Reevaluate at next office visit.   Of NOTE patient was taking older dose of clonidine 0.3 mg BID as she had run out of the 0.2mg  dose.   She was instructed to continue using this dose until follow up visit with Dr Venetia Maxon.

## 2014-07-13 NOTE — Progress Notes (Signed)
S:    Patient arrives walking slowly.   She states she is tired and has not been sleeping much.   Presents for diabetes follow up.  She reports stress of taking care of multiple grand-children AND having a tooth that she believes is abscessed.     Patient has recently tried to stretch her medications and is NOT taking all of her prescribed doses of metformin or novolog (skips mid-day dosing).    O:  Lab Results  Component Value Date   HGBA1C 8.3 01/24/2014     home fasting CBG readings of > 200 routinely with several readings in 300 and 400  A/P: Diabetes longstanding currently worsened control likely related to stress of minimal rest due to taking care of multiple young children, having dental pain with possible abscess.   Denies hypoglycemic events and is able to verbalize appropriate hypoglycemia management plan.  Reports some non-adherence with medication.  Encouraged her to please follow up with a dentist soon.  Several changes to drug therapy were instructed including;  Increase Lantus from 50 to 60 units each night.  Increase Novolog by taking all three doses per day. Increase frequency of adherence with metformin to 1000mg  twice daily.  Patient verbalized understanding of these instructions.  Written patient instructions provided.  Follow up in Pharmacist Clinic Visit after next visit in a few weeks with Dr. Venetia Maxon.    Total time in face to face counseling 35 minutes.   Patient was NOT interested in reordering test strips, DM supplies from [facility].  States she has adequate supply of strips at this time.   Hypertension with elevated BP in office today.  Reports adherence to all BP medications.   Stress including possible tooth abscess may be contributing to pain and elevated BP.   Reevaluate at next office visit.   Of NOTE patient was taking older dose of clonidine 0.3 mg BID as she had run out of the 0.2mg  dose.   She was instructed to continue using this dose until follow up visit  with Dr Venetia Maxon.   Refilled several chronic medications at patient's request - rosuvastatin, carvedilol and nystatin.

## 2014-07-14 NOTE — Progress Notes (Signed)
Patient ID: [name], female   DOB: 11/03/1955, 58 y.o.   MRN: [id number] Reviewed: Agree with Dr. Koval's documentation and management. 

## 2014-08-02 ENCOUNTER — Ambulatory Visit: Payer: Commercial Managed Care - HMO | Admitting: Family Medicine

## 2014-08-05 ENCOUNTER — Emergency Department (INDEPENDENT_AMBULATORY_CARE_PROVIDER_SITE_OTHER)
Admission: EM | Admit: 2014-08-05 | Discharge: 2014-08-05 | Disposition: A | Discharge status: Home / Self Care | Payer: Commercial Managed Care - HMO | Source: Home / Self Care | Attending: Emergency Medicine | Admitting: Emergency Medicine

## 2014-08-05 ENCOUNTER — Encounter (HOSPITAL_COMMUNITY): Payer: Self-pay

## 2014-08-05 DIAGNOSIS — K047 Periapical abscess without sinus: Secondary | ICD-10-CM | POA: Diagnosis not present

## 2014-08-05 MED ORDER — PENICILLIN V POTASSIUM 500 MG PO TABS: 500.0000 mg | ORAL_TABLET | Freq: Four times a day (QID) | ORAL | Status: AC

## 2014-08-05 MED ORDER — TRAMADOL HCL 50 MG PO TABS: 50.0000 mg | ORAL_TABLET | Freq: Four times a day (QID) | ORAL | Status: DC | PRN

## 2014-08-05 NOTE — ED Provider Notes (Signed)
CSN: [id number]     Arrival date & time 08/05/14  1409 History   First MD Initiated Contact with Patient 08/05/14 1414     Chief Complaint  Patient presents with  . Dental Pain   (Consider location/radiation/quality/duration/timing/severity/associated sxs/prior Treatment) HPI  She is a 59 year old woman here for evaluation of dental pain. She states this is been present for over a week. She has tried warm compresses and salt water rinses without improvement. She states she no she needs to see a dentist, but she is scared of the dentist. No fevers or chills. No nausea or vomiting. She does state that her blood sugars have been running in the 300-400 range with this infection.  Past Medical History  Diagnosis Date  . Hypertension   . Hypertension     Hypertensive urgency 05/2006  . Atrial fibrillation   . Hypercholesteremia   . 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 Diabetes mellitus     type 2   Past Surgical History  Procedure Laterality Date  . Tonsilectomy, adenoidectomy, bilateral myringotomy and tubes  age 76  . Dilation and curettage of uterus  1976  . Meniscus repair  03/09    right knee  . Tonsillectomy     Family History  Problem Relation Age of Onset  . Heart failure Mother   . Diabetes Father   . Hypertension Father   . Lung cancer Sister    History  Substance Use Topics  . Smoking status: Never Smoker   . Smokeless tobacco: Never Used  . Alcohol Use: No   OB History    No data available     Review of Systems  Constitutional: Negative for fever and chills.  HENT: Positive for dental problem.   Gastrointestinal: Negative for nausea and vomiting.    Allergies  Propoxyphene n-acetaminophen  Home Medications   Prior to Admission medications   Medication Sig Start Date End Date Taking? Authorizing Provider  amLODipine (NORVASC) 10 MG tablet Take 1 tablet (10 mg total) by mouth daily. 02/16/13   Kandis Nab, MD    aspirin EC 81 MG tablet Take 81 mg by mouth daily.    Historical Provider, MD  B-D ULTRAFINE III SHORT PEN 31G X 8 MM MISC 1 each See admin instructions. Use with insulin 11/18/13   Historical Provider, MD  carvedilol (COREG) 25 MG tablet Take 1 tablet (25 mg total) by mouth 2 (two) times daily with a meal. 03/29/14   Sharon Mt [address], MD  chlorthalidone (HYGROTON) 25 MG tablet Take 1 tablet (25 mg total) by mouth daily. Take at 12:00 noon daily 09/15/13   Zigmund Gottron, MD  cloNIDine (CATAPRES) 0.2 MG tablet Take 0.2 mg by mouth 2 (two) times daily.  09/15/13   Zigmund Gottron, MD  clopidogrel (PLAVIX) 75 MG tablet Take 75 mg by mouth daily. 05/03/14   Historical Provider, MD  glucose blood (ONE TOUCH ULTRA TEST) test strip Check blood sugar three times daily. QS for 1 month supply. Dx 250.02 04/17/14   Zigmund Gottron, MD  HYDROcodone-acetaminophen (NORCO/VICODIN) 5-325 MG per tablet Take 1-2 tablets by mouth every 6 (six) hours as needed for moderate pain. 06/21/14   [place], MD  insulin aspart (NOVOLOG) 100 UNIT/ML injection Inject 15 Units into the skin 2 (two) times daily.     Historical Provider, MD  Insulin Glargine (LANTUS) 100 UNIT/ML Solostar Pen Inject 60 Units into the skin  daily. 07/13/14   Zigmund Gottron, MD  lisinopril (PRINIVIL,ZESTRIL) 40 MG tablet Take 1 tablet (40 mg total) by mouth daily. 11/26/12   [place], DO  metFORMIN (GLUCOPHAGE) 1000 MG tablet Take 1 tablet (1,000 mg total) by mouth 2 (two) times daily with a meal. 10/31/13   Kandis Nab, MD  nortriptyline (PAMELOR) 10 MG capsule Take 1 capsule (10 mg total) by mouth at bedtime. Patient not taking: Reported on 06/17/2014 02/21/14   Dudley Major, DO  NYSTATIN, TOPICAL, POWD Apply up to 3 times daily as needed. 07/13/14   Zigmund Gottron, MD  pantoprazole (PROTONIX) 40 MG tablet Take 1 tablet (40 mg total) by mouth at bedtime. 07/13/14   Zigmund Gottron, MD   penicillin v potassium (VEETID) 500 MG tablet Take 1 tablet (500 mg total) by mouth 4 (four) times daily. 08/05/14 08/12/14  Melony Overly, MD  rosuvastatin (CRESTOR) 10 MG tablet Take 1 tablet (10 mg total) by mouth daily. 07/13/14   Zigmund Gottron, MD  spironolactone (ALDACTONE) 25 MG tablet Take 1 tablet (25 mg total) by mouth daily. 11/24/13   Zigmund Gottron, MD  traMADol (ULTRAM) 50 MG tablet Take 1-2 tablets (50-100 mg total) by mouth every 6 (six) hours as needed. 08/05/14   Melony Overly, MD   BP 196/113 mmHg  Pulse 82  Temp(Src) 98.5 F (36.9 C) (Oral)  SpO2 99% Physical Exam  Constitutional: She is oriented to person, place, and time. She appears well-developed and well-nourished. She appears distressed (mild).  HENT:  Head: Normocephalic and atraumatic.  Mouth/Throat: Mucous membranes are normal.    Cardiovascular: Normal rate.   Pulmonary/Chest: Effort normal.  Neurological: She is alert and oriented to person, place, and time.    ED Course  Procedures (including critical care time) Labs Review Labs Reviewed - No data to display  Imaging Review No results found.   MDM   1. Tooth abscess    We'll treat with penicillin and tramadol. Discussed that she will need to follow up with a dentist for definitive management.  We also discussed her blood pressure. She states she is taking all of her medications and her doctor is currently adjusting medications.    Melony Overly, MD 08/05/14 1440

## 2014-08-05 NOTE — ED Notes (Signed)
C/o mouth pain x 10 days + . Is reportedly on BP medication, and states she has been taking her Rx as prescribed

## 2014-08-05 NOTE — Discharge Instructions (Signed)
You have an abscess tooth. Take penicillin 1 pill 4 times a day for 10 days. Use the tramadol as needed for pain. You will need to see a dentist to get the tooth pulled. Make sure you follow-up with your regular doctor to keep working on your blood pressure.

## 2014-08-10 ENCOUNTER — Ambulatory Visit: Payer: Commercial Managed Care - HMO | Admitting: Pharmacist

## 2014-08-12 DIAGNOSIS — G4733 Obstructive sleep apnea (adult) (pediatric): Secondary | ICD-10-CM | POA: Diagnosis not present

## 2014-09-12 DIAGNOSIS — G4733 Obstructive sleep apnea (adult) (pediatric): Secondary | ICD-10-CM | POA: Diagnosis not present

## 2014-10-11 DIAGNOSIS — G4733 Obstructive sleep apnea (adult) (pediatric): Secondary | ICD-10-CM | POA: Diagnosis not present

## 2014-10-12 ENCOUNTER — Ambulatory Visit (INDEPENDENT_AMBULATORY_CARE_PROVIDER_SITE_OTHER): Payer: Commercial Managed Care - HMO | Admitting: Family Medicine

## 2014-10-12 ENCOUNTER — Encounter: Payer: Self-pay | Admitting: Pharmacist

## 2014-10-12 ENCOUNTER — Encounter: Payer: Self-pay | Admitting: Family Medicine

## 2014-10-12 ENCOUNTER — Ambulatory Visit (INDEPENDENT_AMBULATORY_CARE_PROVIDER_SITE_OTHER): Payer: Commercial Managed Care - HMO | Admitting: Pharmacist

## 2014-10-12 VITALS — BP 208/105 | Temp 98.1°F | Ht 62.0 in | Wt 262.3 lb

## 2014-10-12 DIAGNOSIS — I10 Essential (primary) hypertension: Secondary | ICD-10-CM

## 2014-10-12 DIAGNOSIS — K089 Disorder of teeth and supporting structures, unspecified: Secondary | ICD-10-CM

## 2014-10-12 DIAGNOSIS — E1165 Type 2 diabetes mellitus with hyperglycemia: Secondary | ICD-10-CM

## 2014-10-12 DIAGNOSIS — I480 Paroxysmal atrial fibrillation: Secondary | ICD-10-CM | POA: Diagnosis not present

## 2014-10-12 DIAGNOSIS — G8929 Other chronic pain: Secondary | ICD-10-CM | POA: Diagnosis not present

## 2014-10-12 DIAGNOSIS — IMO0002 Reserved for concepts with insufficient information to code with codable children: Secondary | ICD-10-CM

## 2014-10-12 DIAGNOSIS — I160 Hypertensive urgency: Secondary | ICD-10-CM

## 2014-10-12 LAB — POCT GLYCOSYLATED HEMOGLOBIN (HGB A1C): Hemoglobin A1C: 9.6

## 2014-10-12 MED ORDER — CARVEDILOL 25 MG PO TABS: 25.0000 mg | ORAL_TABLET | Freq: Two times a day (BID) | ORAL | Status: DC

## 2014-10-12 MED ORDER — AMLODIPINE BESYLATE 10 MG PO TABS: 10.0000 mg | ORAL_TABLET | Freq: Every day | ORAL | Status: DC

## 2014-10-12 MED ORDER — SPIRONOLACTONE 25 MG PO TABS: 25.0000 mg | ORAL_TABLET | Freq: Every day | ORAL | Status: DC

## 2014-10-12 MED ORDER — CLONIDINE HCL 0.2 MG PO TABS: 0.2000 mg | ORAL_TABLET | Freq: Two times a day (BID) | ORAL | Status: DC

## 2014-10-12 MED ORDER — CLOPIDOGREL BISULFATE 75 MG PO TABS: 75.0000 mg | ORAL_TABLET | Freq: Every day | ORAL | Status: DC

## 2014-10-12 MED ORDER — LISINOPRIL 40 MG PO TABS: 40.0000 mg | ORAL_TABLET | Freq: Every day | ORAL | Status: DC

## 2014-10-12 MED ORDER — CHLORTHALIDONE 25 MG PO TABS: 25.0000 mg | ORAL_TABLET | Freq: Every day | ORAL | Status: DC

## 2014-10-12 MED ORDER — LIRAGLUTIDE 18 MG/3ML ~~LOC~~ SOPN: 0.6000 mg | PEN_INJECTOR | Freq: Every day | SUBCUTANEOUS | Status: DC

## 2014-10-12 MED ORDER — METFORMIN HCL 1000 MG PO TABS: 1000.0000 mg | ORAL_TABLET | Freq: Two times a day (BID) | ORAL | Status: DC

## 2014-10-12 NOTE — Assessment & Plan Note (Signed)
Patient's BP is uncontrolled at 225/110 due to lack of compliance with medications. Patient's BP meds have been reordered and she stated that she will be picking them up.

## 2014-10-12 NOTE — Progress Notes (Signed)
S:    Patient arrives for her appointment with Dr. Venetia Maxon prior to her appointment with pharmacy clinic. She is ambulating well with the assistance of a cane.    Presents for diabetes follow-up and initiation of Victoza.  She continues to be frustrated with her lack of success with weight loss.   Patient denies adherence with medications. Current diabetes medications include Lantus 55 units daily, Novolog 15 units two times daily, and her new medication that we started today is Victoza 0.6mg  for 1 week, then 1.2 mg for 1 week, then 1.8 mg if tolerated. Patient denies hypoglycemic events.  Patient also reports being out of all of her blood pressure medications for the last 2 to 3 months.   O:  Lab Results  Component Value Date   HGBA1C 9.6 10/12/2014     A/P: Diabetes currently uncontrolled on insulin alone.  Control is suboptimal due to a need for further medication optimization. Victoza was added to current regimen (0.6mg  for 1 week, then 1.2 mg for 1 week, then 1.8 mg if tolerated) which will help with BG and also weight loss at the 1.8mg  dose. Patient was shown how to give her Victoza injections and voiced understanding. Continue all other diabetes medications as previously prescribed. Next A1C anticipated July 2016.   Patient's BP is uncontrolled at 225/110 due to lack of compliance with medications. Patient's BP meds have been reordered and she stated that she will be picking them up.    Written patient instructions provided.  Follow up in Pharmacist Clinic Visit in 4 to 5 weeks.   Total time in face to face counseling 30 minutes.  Patient seen with Richarda Osmond, PharmD Candidate, Elicia Lamp, PharmD, and Nicholas Lose, PharmD, CPP, BCPS.

## 2014-10-12 NOTE — Patient Instructions (Signed)
Continue insulin same doses at this time.   Start Victoza 0.6mg  once daily then in 1 week increase to 1.2mg  injection.  In 2 weeks, increase to 1.8mg  daily.      Follow up with with Dr. Venetia Maxon in 3 weeks AND then Pharmacy Clinics in 4-5 weeks.

## 2014-10-12 NOTE — Progress Notes (Signed)
   Subjective:    Patient ID: [name], female    DOB: 02-17-1956, 59 y.o.   MRN: [id number]  HPI: Pt presents to clinic for f/u of HTN and DM. She is scheduled to see [name] immediately after this visit.  HTN - pt reports she has been out of her medications for "quite some time" (at least a few months) - reports her daughter thew away a lot of her "outdated" medications that she was still taking - she ran out of clonidine about 2 weeks ago - she is still taking Coreg but is out of everything else  DM - reports compliance with Novolog 15 units but only regularly once per day (skips mid-day dose and sometimes in the evening) - pt reports complaince with Lantus 55 units at night (reduced from 60 units in December) - reports she is out of metformin for a similar amount of time as above - denies hypoglycemic episodes  Note pt is still dealing with infected teeth and is having difficulty finding a dentist. She feels this is contributing to her pain, elevated BP, and high blood sugars  Review of Systems: As above. Does report SOB and "sweating" but no change in vision or LE edema. Denies vomiting but occasionally has nauseated with a metallic taste in her mouth. Denies abdominal pain but does have some chronic constipation.     Objective:   Physical Exam BP 208/105 mmHg  Temp(Src) 98.1 F (36.7 C) (Oral)  Ht 5\' 2"  (1.575 m)  Wt 262 lb 4.8 oz (118.978 kg)  BMI 47.96 kg/m2 Gen: well-appearing adult female in NAD HEENT: [place]/AT, EOMI, PERRLA, MMM Cardio: RRR, no murmur appreciated Pulm: CTAB, no wheezes, normal WOB Abd: obese, limiting exam, but nontender, soft, with BS+ Ext: warm, well-perfused, no LE edema Feet: exam grossly unremarkable with globally diminished sensation, but no frank lesions noted, no broken skin or ulcerations     Assessment & Plan:  See problem list notes.

## 2014-10-12 NOTE — Patient Instructions (Addendum)
Thank you for coming in, today!  Your A1c was 9.6 up from last year. I will refill your metformin. Continue your Lantus and Novolog. I will see what [name] recommends for any changes to diabetes medicines, for now.  For your high blood pressure, I will refill your medications, today. Come back to see me in about 1 month to follow up for your blood pressure. I will put in a referral to the heart doctors since you can't see Dr. Terrence Dupont any more.  You can try a couple of different dentist offices for your teeth. 1. Dr. Donn Pierini office [phone] or cell [phone] [address] [place], [place] for tooth removal $200 includes exam, Xray, and extraction and follow up visit.  Bring list of current medications with you.  2. [facility] at [phone] to get the details to get your tooth pulled.   Please feel free to call with any questions or concerns at any time, at [phone]. --Dr. Venetia Maxon

## 2014-10-12 NOTE — Assessment & Plan Note (Signed)
Diabetes currently uncontrolled on insulin alone.  Control is suboptimal due to a need for further medication optimization. Victoza was added to current regimen (0.6mg  for 1 week, then 1.2 mg for 1 week, then 1.8 mg if tolerated) which will help with BG and also weight loss at the 1.8mg  dose. Patient was shown how to give her Victoza injections and voiced understanding. Continue all other diabetes medications as previously prescribed. Next A1C anticipated July 2016.

## 2014-10-13 ENCOUNTER — Encounter: Payer: Self-pay | Admitting: Family Medicine

## 2014-10-13 DIAGNOSIS — K089 Disorder of teeth and supporting structures, unspecified: Secondary | ICD-10-CM

## 2014-10-13 DIAGNOSIS — G8929 Other chronic pain: Secondary | ICD-10-CM | POA: Insufficient documentation

## 2014-10-13 NOTE — Progress Notes (Signed)
Patient ID: [name], female   DOB: 1956-03-23, 59 y.o.   MRN: [id number] Reviewed: Agree with Dr. Graylin Shiver documentation and management.

## 2014-10-13 NOTE — Assessment & Plan Note (Signed)
A: Markedly elevated / uncontrolled, though pt has been out of medications for 3+ months (has been taking clonidine up until a few weeks ago). Generally asymptomatic currently, but does have occasional SOB and "sweating."  P: Refilled lisinopril 40 mg daily, Coreg 25 mg BID, chlorthalidone 25 mg daily, Norvasc 10 mg daily, clonidine 0.2 mg BID. Will plan f/u in 1 month or sooner if needed.

## 2014-10-13 NOTE — Assessment & Plan Note (Signed)
A: Partially compliant with insulin (Lantus daily, most of the time, Novolog usually once daily instead of TID). Not taking metformin for a few months or more. A1c up to 9.6.  P: Pt encouraged to continue insulin. Metformin refilled today; pt instructed to call when medications need refills sooner rather than later. Pt to see [name] immediately after this visit, so will defer other med changes, for now. Will need f/u in 1-3 months (relatively close visit to re-evaluate after medications restarted, then in 3 months for next A1c). [facility] pharmacy clinic assistance.

## 2014-10-13 NOTE — Assessment & Plan Note (Signed)
Followed by Dr. Terrence Dupont in the past; unable to see him, now due to "insurance difficulties." Pt would like to re-establish with cardiology. Currently clinically in NSR and generally asymptomatic (see other problem list notes). On ASA but has been out of Plavix for a few months. New referral placed to cardiology, today. F/u PRN, otherwise.

## 2014-10-13 NOTE — Assessment & Plan Note (Signed)
Generally very poor dentition with only a few remaining teeth in very poor condition (broken, carious). Concern for chronic infection / inflammation contributing to marked pain (elevating BP) and hyperglycemia. Pt trying to find a dentist but having difficulty. Provided information on the [facility] and [facility]. F/u PRN.

## 2014-11-03 ENCOUNTER — Ambulatory Visit (INDEPENDENT_AMBULATORY_CARE_PROVIDER_SITE_OTHER): Payer: Commercial Managed Care - HMO | Admitting: Family Medicine

## 2014-11-03 ENCOUNTER — Encounter: Payer: Self-pay | Admitting: Family Medicine

## 2014-11-03 VITALS — BP 175/98 | HR 79 | Temp 98.6°F | Ht 62.0 in | Wt 259.0 lb

## 2014-11-03 DIAGNOSIS — IMO0002 Reserved for concepts with insufficient information to code with codable children: Secondary | ICD-10-CM

## 2014-11-03 DIAGNOSIS — E1165 Type 2 diabetes mellitus with hyperglycemia: Secondary | ICD-10-CM | POA: Diagnosis not present

## 2014-11-03 DIAGNOSIS — I10 Essential (primary) hypertension: Secondary | ICD-10-CM | POA: Diagnosis not present

## 2014-11-03 MED ORDER — CLONIDINE HCL 0.2 MG PO TABS: 0.2000 mg | ORAL_TABLET | Freq: Three times a day (TID) | ORAL | Status: DC

## 2014-11-03 NOTE — Assessment & Plan Note (Signed)
Overall remains poorly controlled, though pt reports compliance with insulin and Victoza regimen. Pt to f/u in a few weeks with pharmacy clinic; would agree with increase of Victoza if appropriate as pt seems to be tolerating current dosing. Stressed compliance with medications.

## 2014-11-03 NOTE — Patient Instructions (Signed)
Thank you for coming in, today!  Your blood pressure is still pretty high. I want you to start taking CLONIDINE 0.2 mg THREE TIMES PER DAY. This is up from twice per day. I will send in a new prescription - they are the same tablets, so you can use up the ones you already have. Go ahead and pick up the new prescription, just start using the new pills when the old ones run out.  Come back to see [name] in about 2-3 weeks. He may want to adjust some medications, more. Come back to see me sometime after that (late May or early June). Please feel free to call with any questions or concerns at any time, at [phone]. --Dr. Venetia Maxon

## 2014-11-03 NOTE — Progress Notes (Signed)
   Subjective:    Patient ID: [name], female    DOB: Apr 07, 1956, 59 y.o.   MRN: [id number]  HPI: Pt presents to clinic for f/u of HTN. She was seen a few weeks ago and had been out of all of her medications. She states she is taking all of them, now. She reports she takes Coreg 25 mg twice per day, clonidine 0.2 mg twice per day, lisinopril 40 mg daily, amlodipine 10 mg daily, chlorthalidone 25 mg daily, and spironolactone 25 mg once per day. She denies chest pain, SOB, ankle swelling, headaches, or vision changes. She does occasinally get a "needles" feeling in the middle of her chest between her breasts, that is very sharp and only for a second or two -- she describes it like a muscle pull.   Of note, she is scheduled to see cardiology on 4/14 (Dr. Johnsie Cancel) at 8:45. She is also following with [name] in pharmacy clinic and recently started Victoza, which she feels like it is working well for her; her blood sugars are more consistently "high 100's to about 200" instead of being "up over 200's." She is supposed to see [name] again in about 1 month.  Review of Systems: As above. Denies frank chest pain, SOB, LE swelling.     Objective:   Physical Exam BP 175/98 mmHg  Pulse 79  Temp(Src) 98.6 F (37 C) (Oral)  Ht 5\' 2"  (1.575 m)  Wt 259 lb (117.482 kg)  BMI 47.36 kg/m2 Manual recheck BP 168/92 Gen: adult female, obese, in NAD HEENT: [place]/AT, EOMI, PERRLA, MMM Cardio: RRR, no murmur appreciated Pulm: breath sounds distant secondary to body habitus, but generally clear Abd: soft, nontender, BS+; obese, limiting exam Ext: warm, well-perfused, no LE edema appreciated     Assessment & Plan:  See problem list notes.

## 2014-11-03 NOTE — Assessment & Plan Note (Signed)
A: Remains elevated but asymptomatic, though definitely improved with restarting BP meds earlier this month. Reports compliance with Coreg, lisinopril, Aldactone, Norvasc, chlorthalidone, and clonidine (though currently taking 0.2 mg BID when she previously was taking 0.2 mg TID). Denies frank chest pain, SOB, LE swelling. Some chest wall pain described sounds like parasternal muscle pain exacerbated by body habitus.  P: Continue current meds with increase of clonidine to 0.2 mg TID. Strongly advised continued good compliance with medications and monitoring for change in symptoms, worsening functional status, or new symptoms that would suggest frank MI or onset of heart failure. F/u with pharmacy clinic in 2-3 weeks, then with me another 3-4 weeks after that.

## 2014-11-11 DIAGNOSIS — G4733 Obstructive sleep apnea (adult) (pediatric): Secondary | ICD-10-CM | POA: Diagnosis not present

## 2014-11-15 NOTE — Progress Notes (Signed)
Patient ID: [name], female   DOB: Dec 08, 1955, 59 y.o.   MRN: [id number]     Cardiology Office Note   Date:  11/16/2014   ID:  [name], [name] 1956/01/12, MRN [id number]  PCP:  Christa See, MD  Cardiologist:   Jenkins Rouge, MD   No chief complaint on file.     History of Present Illness: [name] is a 59 y.o. female who presents for atypical chest pain.  She has significant HTN and DM both poorly controlled due to body habitus and compliance issues  A1c in March was 9.6 She has atypical chest pain.  Sharp , right and left sided weekly.  Not always exertional.  Also indicates history of PAF although I cannot document with Epic records And ECGls in Epic show NSR.  Sedentary walks with cane.  Compliant with meds   Reviewed echo from July 2015    Study Conclusions  - Left ventricle: The cavity size was normal. Systolic function was normal. The estimated ejection fraction was in the range of 55% to 60%. Wall motion was normal; there were no regional wall motion abnormalities. - Atrial septum: No defect or patent foramen ovale was identified.  Seen by Dr Doylene Canard 07/2014  For evaluation after stroke.  MRI with lacunar infarcts and possible amyloid angiopathy   Past Medical History  Diagnosis Date  . Hypertension   . Hypertension     Hypertensive urgency 05/2006  . Atrial fibrillation   . Hypercholesteremia   . 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 Diabetes mellitus     type 2    Past Surgical History  Procedure Laterality Date  . Tonsilectomy, adenoidectomy, bilateral myringotomy and tubes  age 11  . Dilation and curettage of uterus  1976  . Meniscus repair  03/09    right knee  . Tonsillectomy       Current Outpatient Prescriptions  Medication Sig Dispense Refill  . amLODipine (NORVASC) 10 MG tablet Take 1 tablet (10 mg total) by mouth daily. 30 tablet 3  . aspirin EC 81 MG tablet Take 81 mg by mouth  daily.    . B-D ULTRAFINE III SHORT PEN 31G X 8 MM MISC 1 each See admin instructions. Use with insulin    . carvedilol (COREG) 25 MG tablet Take 1 tablet (25 mg total) by mouth 2 (two) times daily with a meal. 60 tablet 3  . chlorthalidone (HYGROTON) 25 MG tablet Take 1 tablet (25 mg total) by mouth daily. Take at 12:00 noon daily 30 tablet 1  . cloNIDine (CATAPRES) 0.2 MG tablet Take 1 tablet (0.2 mg total) by mouth 3 (three) times daily. 90 tablet 1  . clopidogrel (PLAVIX) 75 MG tablet Take 1 tablet (75 mg total) by mouth daily. 30 tablet 1  . glucose blood (ONE TOUCH ULTRA TEST) test strip Check blood sugar three times daily. QS for 1 month supply. Dx 250.02 100 each 11  . insulin aspart (NOVOLOG) 100 UNIT/ML injection Inject 15 Units into the skin 2 (two) times daily.     . Insulin Glargine (LANTUS) 100 UNIT/ML Solostar Pen Inject 55 Units into the skin daily.    . Liraglutide (VICTOZA) 18 MG/3ML SOPN Inject 0.1-0.3 mLs (0.6-1.8 mg total) into the skin daily. Increase in 1 week to 1.2mg  then to 1.8mg  6 mL 3  . lisinopril (PRINIVIL,ZESTRIL) 40 MG tablet Take 1 tablet (40 mg total) by mouth daily. [place]  tablet 6  . metFORMIN (GLUCOPHAGE) 1000 MG tablet Take 1 tablet (1,000 mg total) by mouth 2 (two) times daily with a meal. 60 tablet 6  . nortriptyline (PAMELOR) 10 MG capsule Take 1 capsule (10 mg total) by mouth at bedtime. 30 capsule 0  . NYSTATIN, TOPICAL, POWD Apply up to 3 times daily as needed. 1 Bottle 3  . pantoprazole (PROTONIX) 40 MG tablet Take 1 tablet (40 mg total) by mouth at bedtime. 30 tablet 1  . rosuvastatin (CRESTOR) 10 MG tablet Take 1 tablet (10 mg total) by mouth daily. 30 tablet 1  . spironolactone (ALDACTONE) 25 MG tablet Take 1 tablet (25 mg total) by mouth daily. 30 tablet 1   No current facility-administered medications for this visit.    Allergies:   Propoxyphene n-acetaminophen    Social History:  The patient  reports that she has never smoked. She has never used  smokeless tobacco. She reports that she does not drink alcohol or use illicit drugs.   Family History:  The patient's family history includes Diabetes in her father; Heart failure in her mother; Hypertension in her father; Lung cancer in her sister.    ROS:  Please see the history of present illness.   Otherwise, review of systems are positive for none.   All other systems are reviewed and negative.    PHYSICAL EXAM: VS:  BP 148/90 mmHg  Pulse 84  Ht 5\' 2"  (1.575 m)  Wt 256 lb 12.8 oz (116.484 kg)  BMI 46.96 kg/m2 , BMI Body mass index is 46.96 kg/(m^2). Obese black female  HEENT: normal Neck: no JVD, carotid bruits, or masses Cardiac:  RRR; no murmurs, rubs, or gallops,no edema  Respiratory:  clear to auscultation bilaterally, normal work of breathing GI: soft, nontender, nondistended, + BS MS: no deformity or atrophy Skin: warm and dry, no rash Neuro:  Strength and sensation are intact Psych: euthymic mood, full affect   EKG:   SR rate 87 low voltage 01/28/14     Recent Labs: 06/02/2014: ALT 20 06/17/2014: BUN 12; Creatinine 0.82; Hemoglobin 13.5; Platelets 333; Potassium 4.1; Pro B Natriuretic peptide (BNP) 169.0*; Sodium 138    Lipid Panel    Component Value Date/Time   CHOL 124 07/08/2012 0550   TRIG 105 07/08/2012 0550   HDL 38* 07/08/2012 0550   CHOLHDL 3.3 07/08/2012 0550   VLDL 21 07/08/2012 0550   LDLCALC 65 07/08/2012 0550   LDLDIRECT 71 07/12/2013 1610      Wt Readings from Last 3 Encounters:  11/16/14 256 lb 12.8 oz (116.484 kg)  11/03/14 259 lb (117.482 kg)  10/12/14 262 lb 4.8 oz (118.978 kg)      Other studies Reviewed: Additional studies/ records that were reviewed today include: Dr Glennon Mac records and Epic notes see HPI.    ASSESSMENT AND PLAN:  1.  HTN:  Well controlled.  Continue current medications and low sodium Dash type diet.   2. Chest pain atypical multiple risk factors and poorly controlled DM  F/U 2 day lexiscan myovue 3. DM:   Discussed low carb diet.  Target hemoglobin A1c is 6.5 or less.  Continue current medications. 4/ Chol:  At goal 65 continue statin 5. CVA:  Likely more lacunar and amyloid angiopathy.  Continue asa and plavix    Current medicines are reviewed at length with the patient today.  The patient does not have concerns regarding medicines.  The following changes have been made:  no change  Labs/ tests  ordered today include: Lexiscan Myovue  Orders Placed This Encounter  Procedures  . Myocardial Perfusion Imaging     Disposition:   FU with  Me in a year  If myovue normal     Signed, Jenkins Rouge, MD  11/16/2014 9:06 AM    [facility] [place], [place], [place]  [address] Phone: [phone]; Fax: [phone]

## 2014-11-16 ENCOUNTER — Ambulatory Visit (INDEPENDENT_AMBULATORY_CARE_PROVIDER_SITE_OTHER): Payer: Commercial Managed Care - HMO | Admitting: Cardiovascular Disease

## 2014-11-16 ENCOUNTER — Encounter: Payer: Self-pay | Admitting: Cardiovascular Disease

## 2014-11-16 VITALS — BP 148/90 | HR 84 | Ht 62.0 in | Wt 256.8 lb

## 2014-11-16 DIAGNOSIS — R0789 Other chest pain: Secondary | ICD-10-CM | POA: Diagnosis not present

## 2014-11-16 NOTE — Patient Instructions (Signed)
Medication Instructions:  NO  CHANGE  Labwork:   Testing/Procedures: Your physician has requested that you have a lexiscan myoview. For further information please visit [email or web address]. Please follow instruction sheet, as given.   Follow-Up: YEAR WITH  DR Johnsie Cancel  Any Other Special Instructions Will Be Listed Below (If Applicable).

## 2014-11-20 ENCOUNTER — Ambulatory Visit: Payer: Commercial Managed Care - HMO | Admitting: Pharmacist

## 2014-11-28 ENCOUNTER — Ambulatory Visit (HOSPITAL_COMMUNITY): Payer: Commercial Managed Care - HMO | Attending: Cardiovascular Disease | Admitting: Radiology

## 2014-11-28 VITALS — BP 191/105 | Ht 62.0 in | Wt 257.0 lb

## 2014-11-28 DIAGNOSIS — R079 Chest pain, unspecified: Secondary | ICD-10-CM | POA: Insufficient documentation

## 2014-11-28 DIAGNOSIS — R42 Dizziness and giddiness: Secondary | ICD-10-CM

## 2014-11-28 DIAGNOSIS — R0609 Other forms of dyspnea: Secondary | ICD-10-CM | POA: Diagnosis not present

## 2014-11-28 DIAGNOSIS — I10 Essential (primary) hypertension: Secondary | ICD-10-CM | POA: Diagnosis not present

## 2014-11-28 DIAGNOSIS — R0602 Shortness of breath: Secondary | ICD-10-CM | POA: Insufficient documentation

## 2014-11-28 DIAGNOSIS — E119 Type 2 diabetes mellitus without complications: Secondary | ICD-10-CM | POA: Insufficient documentation

## 2014-11-28 DIAGNOSIS — Z794 Long term (current) use of insulin: Secondary | ICD-10-CM | POA: Diagnosis not present

## 2014-11-28 DIAGNOSIS — R0789 Other chest pain: Secondary | ICD-10-CM

## 2014-11-28 MED ORDER — TECHNETIUM TC 99M SESTAMIBI GENERIC - CARDIOLITE
33.0000 | Freq: Once | INTRAVENOUS | Status: AC | PRN
  Administered 2014-11-28: 33 via INTRAVENOUS

## 2014-11-28 MED ORDER — AMINOPHYLLINE 25 MG/ML IV SOLN
75.0000 mg | Freq: Once | INTRAVENOUS | Status: AC
  Administered 2014-11-28: 75 mg via INTRAVENOUS

## 2014-11-28 MED ORDER — REGADENOSON 0.4 MG/5ML IV SOLN
0.4000 mg | Freq: Once | INTRAVENOUS | Status: AC
  Administered 2014-11-28: 0.4 mg via INTRAVENOUS

## 2014-11-28 NOTE — Progress Notes (Signed)
[facility] [address] [place], [place] [address] [phone]    Cardiology Nuclear Med Study  [name] is a 59 y.o. female     MRN : [id number]     DOB: 02/01/1956  Procedure Date: 11/28/2014  Nuclear Med Background Indication for Stress Test:  Evaluation for Ischemia History:  MPI: 03/26/2011 Ischemia EF: 61%, H/O AFIB Cardiac Risk Factors: Hypertension and IDDM   Symptoms:  Chest Pain, DOE and SOB   Nuclear Pre-Procedure Caffeine/Decaff Intake:  None> 12 hrs NPO After: 11:00pm   Lungs:  clear O2 Sat: 96% on room air. IV 0.9% NS with Angio Cath:  22g  IV Site: R Antecubital x 1, tolerated well IV Started by:  Irven Baltimore, RN  Chest Size (in):  48 Cup Size: DD  Height: 5\' 2"  (1.575 m)  Weight:  257 lb (116.574 kg)  BMI:  Body mass index is 46.99 kg/(m^2). Tech Comments:  No medications (Insulin or Glucophage) this am. Fasting CBG was 135 at 0840 today. Irven Baltimore, RN.    Nuclear Med Study 1 or 2 day study: 1 day  Stress Test Type:  Carlton Adam  Reading MD: N/A  Order Authorizing Provider:  Jenkins Rouge, MD  Resting Radionuclide: Technetium 68m Sestamibi  Resting Radionuclide Dose: 33.0 mCi on 11/30/2014  Stress Radionuclide:  Technetium 57m Sestamibi  Stress Radionuclide Dose: 33.0 mCi on 11/28/2014          Stress Protocol Rest HR: 68 Stress HR: 96  Rest BP: 191/105 Stress BP: 198/106  Exercise Time (min): n/a METS: n/a   Predicted Max HR: 161 bpm % Max HR: 59.63 bpm Rate Pressure Product: 19008   Dose of Adenosine (mg):  n/a Dose of Lexiscan: 0.4 mg  Dose of Atropine (mg): n/a Dose of Dobutamine: n/a mcg/kg/min (at max HR)  Stress Test Technologist: Perrin Maltese, EMT-P  Nuclear Technologist:  Earl Many, CNMT     Rest Procedure:  Myocardial perfusion imaging was performed at rest 45 minutes following the intravenous administration of Technetium 77m Sestamibi. Rest ECG: NSR with septal infarct  Stress Procedure:   The patient received IV Lexiscan 0.4 mg over 15-seconds.  Technetium 1m Sestamibi injected at 30-seconds. This patient was lt. Headed and had chest pressure with the Lexiscan injection. Quantitative spect images were obtained after a 45 minute delay. Stress ECG: No significant change from baseline ECG  QPS Raw Data Images:  There is a breast shadow that accounts for the anterior attenuation. Stress Images:  There is decreased uptake in the apex, apical anterior and basal and mid inferolateral walls. Rest Images:  There is decreased uptake in the apex. Subtraction (SDS):  Medium in size, mild in intensity defects are seen in the mid and basal inferolateral wall that appears reversible.  There is a small in size, small in intensity fixed defect in the apical anterior and apcial walls.  These defects most likely represent shifting breast attenuation artifact.  The RAW images demonstrate large pendulous breasts overlying the cardiac silhouette. Transient Ischemic Dilatation (Normal <1.22):  1.11 Lung/Heart Ratio (Normal <0.45):  0.23  Quantitative Gated Spect Images QGS EDV:  93 ml QGS ESV:  39 ml  Impression Exercise Capacity:  Lexiscan with no exercise. BP Response:  Hypertensive blood pressure response. Clinical Symptoms:  Typical chest pain. ECG Impression:  No significant ST segment change suggestive of ischemia. Comparison with Prior Nuclear Study: No significant change from previous study  Overall Impression:  Low risk  stress nuclear study with Medium in size, mild in intensity defects  in the mid and basal inferolateral wall that appear reversible.  There is a small in size, small in intensity fixed defect in the apical anterior and apical walls.  These defects most likely represent shifting breast attenuation artifact.  The RAW images demonstrate large pendulous breasts overlying the cardiac silhouette. Cannot rule out a small area of ischemia in the inferolateral wall.   LV Ejection  Fraction: 59%.  LV Wall Motion:  NL LV Function; NL Wall Motion  Signed: Fransico Him, MD [facility] 11/30/2014

## 2014-11-30 ENCOUNTER — Ambulatory Visit (HOSPITAL_COMMUNITY): Payer: Commercial Managed Care - HMO | Attending: Cardiovascular Disease

## 2014-11-30 ENCOUNTER — Encounter (HOSPITAL_COMMUNITY): Payer: Commercial Managed Care - HMO

## 2014-11-30 MED ORDER — TECHNETIUM TC 99M SESTAMIBI GENERIC - CARDIOLITE
33.0000 | Freq: Once | INTRAVENOUS | Status: AC | PRN
  Administered 2014-11-30: 33 via INTRAVENOUS

## 2014-12-04 ENCOUNTER — Emergency Department (HOSPITAL_COMMUNITY)
Admission: EM | Admit: 2014-12-04 | Discharge: 2014-12-04 | Disposition: A | Discharge status: Home / Self Care | Payer: Commercial Managed Care - HMO | Attending: Emergency Medicine | Admitting: Emergency Medicine

## 2014-12-04 ENCOUNTER — Encounter (HOSPITAL_COMMUNITY): Payer: Self-pay

## 2014-12-04 DIAGNOSIS — G479 Sleep disorder, unspecified: Secondary | ICD-10-CM | POA: Diagnosis not present

## 2014-12-04 DIAGNOSIS — K089 Disorder of teeth and supporting structures, unspecified: Secondary | ICD-10-CM

## 2014-12-04 DIAGNOSIS — E119 Type 2 diabetes mellitus without complications: Secondary | ICD-10-CM | POA: Diagnosis not present

## 2014-12-04 DIAGNOSIS — Z8673 Personal history of transient ischemic attack (TIA), and cerebral infarction without residual deficits: Secondary | ICD-10-CM | POA: Diagnosis not present

## 2014-12-04 DIAGNOSIS — G8929 Other chronic pain: Secondary | ICD-10-CM | POA: Diagnosis not present

## 2014-12-04 DIAGNOSIS — K002 Abnormalities of size and form of teeth: Secondary | ICD-10-CM | POA: Insufficient documentation

## 2014-12-04 DIAGNOSIS — I10 Essential (primary) hypertension: Secondary | ICD-10-CM | POA: Diagnosis not present

## 2014-12-04 DIAGNOSIS — Z79899 Other long term (current) drug therapy: Secondary | ICD-10-CM | POA: Diagnosis not present

## 2014-12-04 DIAGNOSIS — K088 Other specified disorders of teeth and supporting structures: Secondary | ICD-10-CM | POA: Diagnosis present

## 2014-12-04 DIAGNOSIS — E780 Pure hypercholesterolemia: Secondary | ICD-10-CM | POA: Insufficient documentation

## 2014-12-04 DIAGNOSIS — Z794 Long term (current) use of insulin: Secondary | ICD-10-CM | POA: Diagnosis not present

## 2014-12-04 DIAGNOSIS — Z7982 Long term (current) use of aspirin: Secondary | ICD-10-CM | POA: Insufficient documentation

## 2014-12-04 DIAGNOSIS — I4891 Unspecified atrial fibrillation: Secondary | ICD-10-CM | POA: Diagnosis not present

## 2014-12-04 LAB — CBG MONITORING, ED: Glucose-Capillary: 136 mg/dL — ABNORMAL HIGH (ref 70–99)

## 2014-12-04 MED ORDER — PENICILLIN V POTASSIUM 250 MG PO TABS: 250.0000 mg | ORAL_TABLET | Freq: Four times a day (QID) | ORAL | Status: AC

## 2014-12-04 NOTE — ED Notes (Addendum)
Pt.  Having bilateral toothache to 2  Upper teeth.  She reports having these toothaches for 2 months. And is unable to get into a dentist.  Pt. Is not able to take any of her medication since SAturday due to the cold and hot sensitivity to her teeth.  Pt. Has HTN

## 2014-12-04 NOTE — ED Notes (Signed)
CBG 136 

## 2014-12-04 NOTE — ED Notes (Signed)
Pt. Reports that all of the dentist that she called, will not take her [employer].  Pt. Would like to speak to a Education officer, museum or Case Management with assistance of finding a Pharmacist, community.

## 2014-12-04 NOTE — Discharge Instructions (Signed)

## 2014-12-04 NOTE — ED Notes (Signed)
Declined W/C at D/C and was escorted to lobby by RN. 

## 2014-12-04 NOTE — ED Provider Notes (Signed)
CSN: [id number]     Arrival date & time 12/04/14  1353 History   First MD Initiated Contact with Patient 12/04/14 1513     Chief Complaint  Patient presents with  . Dental Pain     (Consider location/radiation/quality/duration/timing/severity/associated sxs/prior Treatment) HPI Comments: Patient presents emergency department with chief complaint of dental pain. She states that she has had dental pain for the past 2 months. She states that the pain in her teeth keep her from wanting to eat, which keeps her from taking her regular medications. 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 She needs assistance in finding a dentist. She states the pain is moderate. She denies any fevers or chills. States pain is worsened with eating.  The history is provided by the patient. No language interpreter was used.    Past Medical History  Diagnosis Date  . Hypertension   . Hypertension     Hypertensive urgency 05/2006  . Atrial fibrillation   . Hypercholesteremia   . 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 Diabetes mellitus     type 2   Past Surgical History  Procedure Laterality Date  . Tonsilectomy, adenoidectomy, bilateral myringotomy and tubes  age 1  . Dilation and curettage of uterus  1976  . Meniscus repair  03/09    right knee  . Tonsillectomy     Family History  Problem Relation Age of Onset  . Heart failure Mother   . Diabetes Father   . Hypertension Father   . Lung cancer Sister    History  Substance Use Topics  . Smoking status: Never Smoker   . Smokeless tobacco: Never Used  . Alcohol Use: No   OB History    No data available     Review of Systems  Constitutional: Negative for fever and chills.  HENT: Positive for dental problem. Negative for drooling.   Neurological: Negative for speech difficulty.  Psychiatric/Behavioral: Positive for sleep disturbance.      Allergies  Propoxyphene n-acetaminophen  Home  Medications   Prior to Admission medications   Medication Sig Start Date End Date Taking? Authorizing Provider  amLODipine (NORVASC) 10 MG tablet Take 1 tablet (10 mg total) by mouth daily. 10/12/14   [place], MD  aspirin EC 81 MG tablet Take 81 mg by mouth daily.    Historical Provider, MD  B-D ULTRAFINE III SHORT PEN 31G X 8 MM MISC 1 each See admin instructions. Use with insulin 11/18/13   Historical Provider, MD  carvedilol (COREG) 25 MG tablet Take 1 tablet (25 mg total) by mouth 2 (two) times daily with a meal. 10/12/14   Sharon Mt Street, MD  chlorthalidone (HYGROTON) 25 MG tablet Take 1 tablet (25 mg total) by mouth daily. Take at 12:00 noon daily 10/12/14   Sharon Mt Street, MD  cloNIDine (CATAPRES) 0.2 MG tablet Take 1 tablet (0.2 mg total) by mouth 3 (three) times daily. 11/03/14   [place], MD  clopidogrel (PLAVIX) 75 MG tablet Take 1 tablet (75 mg total) by mouth daily. 10/12/14   Sharon Mt Street, MD  glucose blood (ONE TOUCH ULTRA TEST) test strip Check blood sugar three times daily. QS for 1 month supply. Dx 250.02 04/17/14   Zenia Resides, MD  insulin aspart (NOVOLOG) 100 UNIT/ML injection Inject 15 Units into the skin 2 (two) times daily.     Historical Provider, MD  Insulin Glargine (LANTUS)  100 UNIT/ML Solostar Pen Inject 55 Units into the skin daily. 10/12/14   Zenia Resides, MD  Liraglutide (VICTOZA) 18 MG/3ML SOPN Inject 0.1-0.3 mLs (0.6-1.8 mg total) into the skin daily. Increase in 1 week to 1.2mg  then to 1.8mg  10/12/14   Zenia Resides, MD  lisinopril (PRINIVIL,ZESTRIL) 40 MG tablet Take 1 tablet (40 mg total) by mouth daily. 10/12/14   [place]arez, MD  metFORMIN (GLUCOPHAGE) 1000 MG tablet Take 1 tablet (1,000 mg total) by mouth 2 (two) times daily with a meal. 10/12/14   Sharon Mt Street, MD  nortriptyline (PAMELOR) 10 MG capsule Take 1 capsule (10 mg total) by mouth at bedtime. 02/21/14   Pieter Partridge, DO  NYSTATIN, TOPICAL,  POWD Apply up to 3 times daily as needed. 07/13/14   Zenia Resides, MD  pantoprazole (PROTONIX) 40 MG tablet Take 1 tablet (40 mg total) by mouth at bedtime. 07/13/14   Zenia Resides, MD  penicillin v potassium (VEETID) 250 MG tablet Take 1 tablet (250 mg total) by mouth 4 (four) times daily. 12/04/14 12/11/14  Montine Circle, PA-C  rosuvastatin (CRESTOR) 10 MG tablet Take 1 tablet (10 mg total) by mouth daily. 07/13/14   Zenia Resides, MD  spironolactone (ALDACTONE) 25 MG tablet Take 1 tablet (25 mg total) by mouth daily. 10/12/14   Zenia Resides, MD   BP 187/102 mmHg  Pulse 81  Temp(Src) 98.3 F (36.8 C) (Oral)  Resp 18  Ht 5\' 2"  (1.575 m)  Wt 257 lb (116.574 kg)  BMI 46.99 kg/m2  SpO2 99% Physical Exam  Constitutional: She is oriented to person, place, and time. She appears well-developed and well-nourished.  HENT:  Head: Normocephalic and atraumatic.  Mouth/Throat:    Poor dentition throughout.  Affected tooth as diagrammed.  No signs of peritonsillar or tonsillar abscess.  No signs of gingival abscess. Oropharynx is clear and without exudates.  Uvula is midline.  Airway is intact. No signs of Ludwig's angina with palpation of oral and sublingual mucosa.   Eyes: Conjunctivae and EOM are normal.  Neck: Normal range of motion.  Cardiovascular: Normal rate.   Pulmonary/Chest: Effort normal.  Abdominal: She exhibits no distension.  Musculoskeletal: Normal range of motion.  Neurological: She is alert and oriented to person, place, and time.  Skin: Skin is dry.  Psychiatric: She has a normal mood and affect. Her behavior is normal. Judgment and thought content normal.  Nursing note and vitals reviewed.   ED Course  Procedures (including critical care time) Labs Review Labs Reviewed - No data to display  Imaging Review No results found.   EKG Interpretation None      MDM   Final diagnoses:  Chronic dental pain    Patient with toothache.  No gross abscess.   Exam unconcerning for Ludwig's angina or spread of infection.  Will treat with penicillin and pain medicine.  Urged patient to follow-up with dentist.    Patient given on call dentist phone number and urged to call within 24 hours.     Montine Circle, PA-C 12/04/14 1550  Debby Freiberg, MD 12/05/14 [phone]

## 2014-12-07 ENCOUNTER — Ambulatory Visit (INDEPENDENT_AMBULATORY_CARE_PROVIDER_SITE_OTHER): Payer: Self-pay | Admitting: Pharmacist

## 2014-12-07 ENCOUNTER — Encounter: Payer: Self-pay | Admitting: Pharmacist

## 2014-12-07 VITALS — Wt 263.1 lb

## 2014-12-07 DIAGNOSIS — K047 Periapical abscess without sinus: Secondary | ICD-10-CM

## 2014-12-07 DIAGNOSIS — E1165 Type 2 diabetes mellitus with hyperglycemia: Secondary | ICD-10-CM

## 2014-12-07 DIAGNOSIS — IMO0002 Reserved for concepts with insufficient information to code with codable children: Secondary | ICD-10-CM

## 2014-12-07 MED ORDER — HYDROCODONE-ACETAMINOPHEN 5-325 MG PO TABS: 1.0000 | ORAL_TABLET | Freq: Four times a day (QID) | ORAL | Status: DC | PRN

## 2014-12-07 NOTE — Progress Notes (Signed)
Patient ID: [name], female   DOB: 1955/08/19, 59 y.o.   MRN: [id number] Patient here to follow up with Dr Valentina Lucks. She has been having dental pain for few days for which she went to the ED and was given penicillin for possible dental abscess. She mentioned to Dr Valentina Lucks that she feels like popping all the medication she has and just end it. I went in to talk to her, she was teary during the whole period of interviewing her. Her depression screening ( PHQ9) was 12. She restated to me that she feel like ending everything due to the pain she is currently experiencing although her religion is against suicide. I recommended to patient and Dr Valentina Lucks that she need inpatient admission for 24 hrs supervision for suicidal ideation. Patient is against this. I discussed with patient that she might need to be committed against her will.  Dr Valentina Lucks and I discussed with Dr Andria Frames who agreed to go in and reassess patient. Patient care has been handed over to Dr Andria Frames and Valentina Lucks.

## 2014-12-07 NOTE — Progress Notes (Signed)
S:    Patient arrives stating "I am miserable".  She presents for diabetes follow up.  She was seen two days ago in the emergency room for tooth and jaw pain.  She reports being give a prescription for Penicillin, contact information for a dental clinic and no pain medications.  She states she has been "chewing up tylenol" for pain relief.   She reports recently losing her medicaid and [employer] drug coverage.  She is visibly in pain, frustrated and states "I am considering just taking all of my pills".   She was open to discussing these thoughts of "taking all of her pills" with another provider.   I discussed the above exchange with Dr. Gwendlyn Deutscher and then Dr. Erin Hearing.   Additional evaluation and discussion of treatment plans including hospitalization was discussed.  The patient refused admission.   O:    A/P: Deferred management of depressed mood/frustration with possible suicidal gesture to Dr. Gwendlyn Deutscher and Dr. Andria Frames.   Diabetes currently under fair control however patient' pain and possible infection will complicate control for the next several day.   Discussed short-term management of blood sugar - no change at this time.  Asked to call our office if blood sugars are > 300 consistently.     Deferred management of tooth pain and possible infection.  Instructed patient to Continue to take antibiotic on schedule and call our office to help with additional antibiotics supply if her dental clinic visit is delayed.  Advised to take pain medicines prescribed today while avoiding the use of extra acetaminophen.   Assistance for [employer] provided in office in by [employer].  Written patient instructions provided.    Next visit with Dr. Venetia Maxon.   Follow up in Pharmacist Clinic Visit prn.   Total time in face to face counseling 60 minutes.  Patient seen with Dr. Gwendlyn Deutscher, Dr. Andria Frames and Jonna Munro, PharmD Candidate.

## 2014-12-07 NOTE — Patient Instructions (Addendum)
Continue to take your antibiotics - please call our office to help with additional antibiotics.   Please continue to take your diabetes medicines as previously.  Call our office for your diabetes if your blood sugars are > 300 consistently.   Take you pain medicines prescribed today.   Don't take extra Acetaminophen (Tylenol) while taking your pain medicine.   Follow up with Pamala Hurry for additional financial evaluation and potential help with dental clinic visit.   Next visit with Dr. Venetia Maxon in 1-2 weeks.

## 2014-12-07 NOTE — Assessment & Plan Note (Signed)
Diabetes currently under fair control however patient' pain and possible infection will complicate control for the next several day.   Discussed short-term management of blood sugar - no change at this time.  Asked to call our office if blood sugars are > 300 consistently.

## 2014-12-07 NOTE — Assessment & Plan Note (Signed)
Deferred management of tooth pain and possible infection.  Instructed patient to Continue to take antibiotic on schedule and call our office to help with additional antibiotics supply if her dental clinic visit is delayed.  Advised to take pain medicines prescribed today while avoiding the use of extra acetaminophen.   Assistance for [employer] provided in office in by [employer].  Written patient instructions provided.

## 2014-12-08 NOTE — Progress Notes (Signed)
Patient ID: [name], female   DOB: 02/09/1956, 59 y.o.   MRN: [id number] Reviewed and patient seen after she made a comment to [name] that she would take a bunch of pills to get out of pain.  I talked with the patient and she is not suicidal.  I did contract for safety.  We agreed to short term pain medications.

## 2014-12-11 ENCOUNTER — Ambulatory Visit: Payer: Commercial Managed Care - HMO | Admitting: Family Medicine

## 2015-01-17 ENCOUNTER — Emergency Department (HOSPITAL_COMMUNITY)
Admission: EM | Admit: 2015-01-17 | Discharge: 2015-01-17 | Disposition: A | Discharge status: Home / Self Care | Payer: Medicare Other | Attending: Emergency Medicine | Admitting: Emergency Medicine

## 2015-01-17 ENCOUNTER — Encounter (HOSPITAL_COMMUNITY): Payer: Self-pay | Admitting: Emergency Medicine

## 2015-01-17 DIAGNOSIS — E669 Obesity, unspecified: Secondary | ICD-10-CM | POA: Insufficient documentation

## 2015-01-17 DIAGNOSIS — I10 Essential (primary) hypertension: Secondary | ICD-10-CM | POA: Diagnosis not present

## 2015-01-17 DIAGNOSIS — Z7982 Long term (current) use of aspirin: Secondary | ICD-10-CM | POA: Insufficient documentation

## 2015-01-17 DIAGNOSIS — Z79899 Other long term (current) drug therapy: Secondary | ICD-10-CM | POA: Diagnosis not present

## 2015-01-17 DIAGNOSIS — Z794 Long term (current) use of insulin: Secondary | ICD-10-CM | POA: Diagnosis not present

## 2015-01-17 DIAGNOSIS — M62838 Other muscle spasm: Secondary | ICD-10-CM | POA: Insufficient documentation

## 2015-01-17 DIAGNOSIS — M62830 Muscle spasm of back: Secondary | ICD-10-CM

## 2015-01-17 DIAGNOSIS — M25511 Pain in right shoulder: Secondary | ICD-10-CM | POA: Insufficient documentation

## 2015-01-17 DIAGNOSIS — M546 Pain in thoracic spine: Secondary | ICD-10-CM | POA: Diagnosis present

## 2015-01-17 DIAGNOSIS — Z8673 Personal history of transient ischemic attack (TIA), and cerebral infarction without residual deficits: Secondary | ICD-10-CM | POA: Diagnosis not present

## 2015-01-17 DIAGNOSIS — E119 Type 2 diabetes mellitus without complications: Secondary | ICD-10-CM | POA: Insufficient documentation

## 2015-01-17 DIAGNOSIS — I4891 Unspecified atrial fibrillation: Secondary | ICD-10-CM | POA: Diagnosis not present

## 2015-01-17 DIAGNOSIS — E780 Pure hypercholesterolemia: Secondary | ICD-10-CM | POA: Diagnosis not present

## 2015-01-17 LAB — BASIC METABOLIC PANEL
ANION GAP: 9 (ref 5–15)
BUN: 9 mg/dL (ref 6–20)
CO2: 23 mmol/L (ref 22–32)
Calcium: 9.3 mg/dL (ref 8.9–10.3)
Chloride: 108 mmol/L (ref 101–111)
Creatinine, Ser: 0.82 mg/dL (ref 0.44–1.00)
GFR calc non Af Amer: >60 mL/min (ref >60)
Glucose, Bld: 145 mg/dL — ABNORMAL HIGH (ref 65–99)
Potassium: 3.9 mmol/L (ref 3.5–5.1)
SODIUM: 140 mmol/L (ref 135–145)

## 2015-01-17 LAB — CBC
HEMATOCRIT: 38.4 % (ref 36.0–46.0)
HEMOGLOBIN: 12.3 g/dL (ref 12.0–15.0)
MCH: 22.7 pg — ABNORMAL LOW (ref 26.0–34.0)
MCHC: 32 g/dL (ref 30.0–36.0)
MCV: 70.8 fL — ABNORMAL LOW (ref 78.0–100.0)
Platelets: 307 10*3/uL (ref 150–400)
RBC: 5.42 MIL/uL — AB (ref 3.87–5.11)
RDW: 16.1 % — ABNORMAL HIGH (ref 11.5–15.5)
WBC: 7.6 10*3/uL (ref 4.0–10.5)

## 2015-01-17 LAB — I-STAT TROPONIN, ED: Troponin i, poc: 0 ng/mL (ref 0.00–0.08)

## 2015-01-17 LAB — BRAIN NATRIURETIC PEPTIDE: B Natriuretic Peptide: 23.9 pg/mL (ref 0.0–100.0)

## 2015-01-17 MED ORDER — NAPROXEN 500 MG PO TABS: 500.0000 mg | ORAL_TABLET | Freq: Two times a day (BID) | ORAL | Status: AC

## 2015-01-17 MED ORDER — NITROGLYCERIN 0.4 MG SL SUBL: 0.4000 mg | SUBLINGUAL_TABLET | SUBLINGUAL | Status: DC | PRN

## 2015-01-17 MED ORDER — ASPIRIN 325 MG PO TABS
325.0000 mg | ORAL_TABLET | ORAL | Status: AC
  Administered 2015-01-17: 325 mg via ORAL
  Filled 2015-01-17: qty 1

## 2015-01-17 MED ORDER — METHOCARBAMOL 500 MG PO TABS: 500.0000 mg | ORAL_TABLET | Freq: Two times a day (BID) | ORAL | Status: AC

## 2015-01-17 NOTE — ED Provider Notes (Signed)
CSN: [id number]     Arrival date & time 01/17/15  1251 History   First MD Initiated Contact with Patient 01/17/15 1542     Chief Complaint  Patient presents with  . Torticollis  . Arm Pain     (Consider location/radiation/quality/duration/timing/severity/associated sxs/prior Treatment) Patient is a 59 y.o. female presenting with back pain.  Back Pain Pain location: Left upper back pain radiates up to her neck, to the   parascapular muscles, into the upper part of the left chest. Quality:  Stiffness Stiffness is present:  All day Pain severity:  Severe Pain is:  Same all the time Onset quality:  Gradual Duration:  2 days Timing:  Constant Progression:  Waxing and waning Chronicity:  New Context comment:  Patient awoke one morning with the pain and has progressively worsened Relieved by:  Nothing Worsened by:  Movement (Patient is tried witch hazel rubs and massages) Ineffective treatments: Patient has tried witch hazel rubs and massages. Associated symptoms: no abdominal pain, no abdominal swelling, no bladder incontinence, no chest pain, no dysuria, no fever, no headaches, no leg pain, no numbness, no pelvic pain, no perianal numbness, no tingling, no weakness and no weight loss   Risk factors: obesity   Risk factors: not pregnant and no recent surgery     Past Medical History  Diagnosis Date  . Hypertension   . Hypertension     Hypertensive urgency 05/2006  . Atrial fibrillation   . Hypercholesteremia   . 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 Diabetes mellitus     type 2   Past Surgical History  Procedure Laterality Date  . Tonsilectomy, adenoidectomy, bilateral myringotomy and tubes  age 89  . Dilation and curettage of uterus  1976  . Meniscus repair  03/09    right knee  . Tonsillectomy     Family History  Problem Relation Age of Onset  . Heart failure Mother   . Diabetes Father   . Hypertension Father   . Lung cancer  Sister    History  Substance Use Topics  . Smoking status: Never Smoker   . Smokeless tobacco: Never Used  . Alcohol Use: No   OB History    No data available     Review of Systems  Constitutional: Negative for fever, chills, weight loss, appetite change and fatigue.  HENT: Negative for congestion, ear pain, facial swelling, mouth sores and sore throat.   Eyes: Negative for visual disturbance.  Respiratory: Negative for cough, chest tightness and shortness of breath.   Cardiovascular: Negative for chest pain and palpitations.  Gastrointestinal: Negative for nausea, vomiting, abdominal pain, diarrhea and blood in stool.  Endocrine: Negative for cold intolerance and heat intolerance.  Genitourinary: Negative for bladder incontinence, dysuria, frequency, decreased urine volume, difficulty urinating and pelvic pain.  Musculoskeletal: Positive for back pain. Negative for neck stiffness.  Skin: Negative for rash.  Neurological: Negative for dizziness, tingling, weakness, light-headedness, numbness and headaches.  All other systems reviewed and are negative.     Allergies  Propoxyphene n-acetaminophen  Home Medications   Prior to Admission medications   Medication Sig Start Date End Date Taking? Authorizing Provider  amLODipine (NORVASC) 10 MG tablet Take 1 tablet (10 mg total) by mouth daily. 10/12/14   [place], MD  aspirin EC 81 MG tablet Take 81 mg by mouth daily.    Historical Provider, MD  B-D ULTRAFINE III SHORT PEN 31G X 8  MM MISC 1 each See admin instructions. Use with insulin 11/18/13   Historical Provider, MD  carvedilol (COREG) 25 MG tablet Take 1 tablet (25 mg total) by mouth 2 (two) times daily with a meal. 10/12/14   Sharon Mt Street, MD  chlorthalidone (HYGROTON) 25 MG tablet Take 1 tablet (25 mg total) by mouth daily. Take at 12:00 noon daily 10/12/14   Sharon Mt Street, MD  cloNIDine (CATAPRES) 0.2 MG tablet Take 1 tablet (0.2 mg total) by mouth 3  (three) times daily. 11/03/14   [place], MD  clopidogrel (PLAVIX) 75 MG tablet Take 1 tablet (75 mg total) by mouth daily. Patient not taking: Reported on 12/07/2014 10/12/14   Sharon Mt Street, MD  glucose blood (ONE TOUCH ULTRA TEST) test strip Check blood sugar three times daily. QS for 1 month supply. Dx 250.02 04/17/14   Zenia Resides, MD  HYDROcodone-acetaminophen (NORCO/VICODIN) 5-325 MG per tablet Take 1-2 tablets by mouth every 6 (six) hours as needed for moderate pain. 12/07/14   Zenia Resides, MD  insulin aspart (NOVOLOG) 100 UNIT/ML injection Inject 15 Units into the skin 3 (three) times daily with meals.     Historical Provider, MD  Insulin Glargine (LANTUS) 100 UNIT/ML Solostar Pen Inject 55 Units into the skin daily. 10/12/14   Zenia Resides, MD  Liraglutide (VICTOZA) 18 MG/3ML SOPN Inject 0.1-0.3 mLs (0.6-1.8 mg total) into the skin daily. Increase in 1 week to 1.2mg  then to 1.8mg  Patient taking differently: Inject 1.8 mg into the skin daily.  10/12/14   Zenia Resides, MD  lisinopril (PRINIVIL,ZESTRIL) 40 MG tablet Take 1 tablet (40 mg total) by mouth daily. 10/12/14   [place], MD  metFORMIN (GLUCOPHAGE) 1000 MG tablet Take 1 tablet (1,000 mg total) by mouth 2 (two) times daily with a meal. 10/12/14   [place], MD  methocarbamol (ROBAXIN) 500 MG tablet Take 1 tablet (500 mg total) by mouth 2 (two) times daily. 01/17/15 01/25/15  Addison Lank, MD  naproxen (NAPROSYN) 500 MG tablet Take 1 tablet (500 mg total) by mouth 2 (two) times daily with a meal. 01/17/15 01/25/15  Addison Lank, MD  nortriptyline (PAMELOR) 10 MG capsule Take 1 capsule (10 mg total) by mouth at bedtime. 02/21/14   Pieter Partridge, DO  NYSTATIN, TOPICAL, POWD Apply up to 3 times daily as needed. 07/13/14   Zenia Resides, MD  pantoprazole (PROTONIX) 40 MG tablet Take 1 tablet (40 mg total) by mouth at bedtime. 07/13/14   Zenia Resides, MD  rosuvastatin (CRESTOR) 10 MG tablet  Take 1 tablet (10 mg total) by mouth daily. 07/13/14   Zenia Resides, MD  spironolactone (ALDACTONE) 25 MG tablet Take 1 tablet (25 mg total) by mouth daily. Patient not taking: Reported on 12/07/2014 10/12/14   Zenia Resides, MD   BP 198/72 mmHg  Pulse 61  Temp(Src) 97.4 F (36.3 C) (Oral)  Resp 20  SpO2 100% Physical Exam  Constitutional: She is oriented to person, place, and time. She appears well-developed and well-nourished. No distress.  HENT:  Head: Normocephalic and atraumatic.  Right Ear: External ear normal.  Left Ear: External ear normal.  Nose: Nose normal.  Eyes: Conjunctivae and EOM are normal. Pupils are equal, round, and reactive to light. Right eye exhibits no discharge. Left eye exhibits no discharge. No scleral icterus.  Neck: Normal range of motion. Neck supple.  Cardiovascular: Normal rate, regular rhythm and normal heart sounds.  Exam reveals no gallop and no friction rub.   No murmur heard. Pulmonary/Chest: Effort normal and breath sounds normal. No stridor. No respiratory distress. She has no wheezes.  Abdominal: Soft. She exhibits no distension. There is no tenderness.  Musculoskeletal: She exhibits no edema or tenderness.       Right shoulder: She exhibits pain (In areas noted and imaging) and spasm (Middle fibers of the left trapezius).       Arms: Neurological: She is alert and oriented to person, place, and time.  Skin: Skin is warm and dry. No rash noted. She is not diaphoretic. No erythema.  Psychiatric: She has a normal mood and affect.    ED Course  Procedures (including critical care time) Labs Review Labs Reviewed  CBC - Abnormal; Notable for the following:    RBC 5.42 (*)    MCV 70.8 (*)    MCH 22.7 (*)    RDW 16.1 (*)    All other components within normal limits  BASIC METABOLIC PANEL - Abnormal; Notable for the following:    Glucose, Bld 145 (*)    All other components within normal limits  BRAIN NATRIURETIC PEPTIDE  I-STAT  TROPOININ, ED    Imaging Review No results found.   EKG Interpretation   Date/Time:  Wednesday January 17 2015 13:06:23 EDT Ventricular Rate:  76 PR Interval:  164 QRS Duration: 76 QT Interval:  372 QTC Calculation: 418 R Axis:   8 Text Interpretation:  Normal sinus rhythm Cannot rule out Anterior infarct  , age undetermined Abnormal ECG since last tracing no significant change  Confirmed by BELFI  MD, MELANIE [phone]) on 01/17/2015 4:25:53 PM      MDM   59 year old female presents with 2 days of upper back and neck pain that extends into the left side of her upper chest that she noticed after waking up from sleep 2 days ago. Pain is exacerbated with movement of the head and neck and left arm. No increased shortness of breath, dyspnea on exertion, and no substernal chest pain. Rest of the history of present illness and exam as above. EKG with normal sinus rhythm, normal intervals and axis. No evidence of acute ischemia or arrhythmia or blocks. Similar to previous EKG with nonspecific T-wave abnormalities. Troponin negative. CBC without anemia or leukocytosis. BMP with no electrolyte derangements or renal insufficiency. BNP within normal limits. There is low concern for cardiac etiology. Her presentation is most consistent with torticollis and muscle spasm.   Additionally patient reports that she has not been taking her medications for the past 30 days that she is between insurance companies and has not been able to get her medication. The patient is hypertensive with systolics in the 809X. Patient denies any headache, shortness of breath, dizziness, overt chest pain, increased leg swelling, or dyspnea on exertion. No evidence of hypertensive urgency or emergency presently. I spoke with case manager who will provide the patient with assistance with her medication while she awaits for insurance to kick in.  Patient safely discharged given strict return precautions. Patient discharged in good  condition. Patient seen in conjunction with Dr. Tamera Punt.  Sibyl Parr, [name] Resident  Final diagnoses:  Muscle spasm of back        Addison Lank, MD 01/18/15 0120  Malvin Johns, MD 01/20/15 [phone]

## 2015-01-17 NOTE — Care Management (Signed)
ED CM received consult from Dr. Leonette Monarch concerning patient being without medication x1 month. Reviewed patient's record, patient receives care at [facility], [facility] listed as insurance coverage. Met with patient at bedside, patient states she was dropped by [facility] in April 2016 because of not qualifying for Medicaid. Patient states that she has recently has been granted another health care drug coverage but it does not become  effective until 03/05/15. PMH CVA, DM, Discussed the [facility] program and guideline with patient. Patient made aware of the $3 co-pay per prescription. Patient is agreeable with plan. After reviewing medication list, meds will exceed the MATCH limit., CM contacted the [facility] spoke with Loma Sousa PharmD, regarding medication issue. [facility] will assist with medications. Patient states, that her prescription were e-scribed to [facility] on [address]. [place] will pull medications from [facility] and fill at the [facility] they will be ready for pick -up tomorrow at after 3p. Patient was made aware of plan she is agreeable provided patient with [facility] address and information. Patient verbalizes understanding teach back done, and expressed appreciation for the assistance. Updated Dr. Leonette Monarch and [facility] RN on disposition plan.

## 2015-01-17 NOTE — ED Notes (Signed)
MD at bedside. 

## 2015-01-17 NOTE — ED Notes (Signed)
Called pt's cellop

## 2015-01-17 NOTE — ED Notes (Signed)
Called pt's cell phone number.  States she had just left.  Encouraged pt to be seen.  Pt states she will be back inside in 2 minutes.

## 2015-01-17 NOTE — ED Notes (Signed)
Pt reports pain in the left side of her neck down into her chest,advised she has had pain x 2 days, and has been out of bp meds x 1 month. Pt alert, oriented, nad.

## 2015-01-17 NOTE — Discharge Instructions (Signed)

## 2015-01-17 NOTE — ED Notes (Signed)
Pt reports 2 day hx of neck pain, soreness and left arm pain. Pt reports SOB with walking and is diaphoretic at present.

## 2015-01-17 NOTE — ED Notes (Signed)
Pt called x 2 to be roomed with no response

## 2015-01-26 ENCOUNTER — Other Ambulatory Visit: Payer: Self-pay | Admitting: Family Medicine

## 2015-01-26 DIAGNOSIS — Z1231 Encounter for screening mammogram for malignant neoplasm of breast: Secondary | ICD-10-CM

## 2015-01-29 ENCOUNTER — Ambulatory Visit: Payer: Commercial Managed Care - HMO

## 2015-02-01 ENCOUNTER — Ambulatory Visit
Admission: RE | Admit: 2015-02-01 | Discharge: 2015-02-01 | Disposition: A | Discharge status: Home / Self Care | Payer: Medicare Other | Source: Ambulatory Visit | Attending: Family Medicine | Admitting: Family Medicine

## 2015-02-01 DIAGNOSIS — Z1231 Encounter for screening mammogram for malignant neoplasm of breast: Secondary | ICD-10-CM | POA: Diagnosis not present

## 2015-02-15 ENCOUNTER — Emergency Department (INDEPENDENT_AMBULATORY_CARE_PROVIDER_SITE_OTHER)
Admission: EM | Admit: 2015-02-15 | Discharge: 2015-02-15 | Disposition: A | Discharge status: Home / Self Care | Payer: Medicare Other | Source: Home / Self Care | Attending: Family Medicine | Admitting: Family Medicine

## 2015-02-15 ENCOUNTER — Encounter (HOSPITAL_COMMUNITY): Payer: Self-pay | Admitting: Emergency Medicine

## 2015-02-15 DIAGNOSIS — M25511 Pain in right shoulder: Secondary | ICD-10-CM | POA: Diagnosis not present

## 2015-02-15 MED ORDER — METHOCARBAMOL 500 MG PO TABS: 500.0000 mg | ORAL_TABLET | Freq: Four times a day (QID) | ORAL | Status: DC | PRN

## 2015-02-15 NOTE — ED Notes (Signed)
C/o right shoulder pain for two weeks States she woke with pain  Denies any injury to shoulder No hx shoulder pain

## 2015-02-15 NOTE — Discharge Instructions (Signed)
You have likely strained or sprained some of the muscles around the shoulder blade. This should improve with time but will improve faster with gentle heat and massage as well as stretching exercises and a muscle relaxer. There is no evidence of significant medical illness at this point time. Best of luck

## 2015-02-15 NOTE — ED Provider Notes (Signed)
CSN: [id number]     Arrival date & time 02/15/15  1626 History   First MD Initiated Contact with Patient 02/15/15 1819     Chief Complaint  Patient presents with  . Shoulder Pain   (Consider location/radiation/quality/duration/timing/severity/associated sxs/prior Treatment) HPI   [place] pain: Started approximately 1 week ago. Started after sleeping on her right side which is something the patient really does not do. His persisted for 3 days and then resolve spontaneously. Pain then started up again approximately 3 days ago. Described as dull pain with occasional tingling sensation or feeling of having a splinter in her upper back and shoulder blade area. Does not radiate. No loss of strength or function. No change in sensation. Aleve without benefit. Tylenol 650 without benefit. Denies shortness of breath, chest pain, palpitations, headache, neck stiffness, fevers.   Past Medical History  Diagnosis Date  . Hypertension   . Hypertension     Hypertensive urgency 05/2006  . Atrial fibrillation   . Hypercholesteremia   . 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 Diabetes mellitus     type 2   Past Surgical History  Procedure Laterality Date  . Tonsilectomy, adenoidectomy, bilateral myringotomy and tubes  age 86  . Dilation and curettage of uterus  1976  . Meniscus repair  03/09    right knee  . Tonsillectomy     Family History  Problem Relation Age of Onset  . Heart failure Mother   . Diabetes Father   . Hypertension Father   . Lung cancer Sister    History  Substance Use Topics  . Smoking status: Never Smoker   . Smokeless tobacco: Never Used  . Alcohol Use: No   OB History    No data available     Review of Systems Per HPI with all other pertinent systems negative.   Allergies  Propoxyphene n-acetaminophen  Home Medications   Prior to Admission medications   Medication Sig Start Date End Date Taking? Authorizing Provider   amLODipine (NORVASC) 10 MG tablet Take 1 tablet (10 mg total) by mouth daily. 10/12/14   [place], MD  aspirin EC 81 MG tablet Take 81 mg by mouth daily.    Historical Provider, MD  B-D ULTRAFINE III SHORT PEN 31G X 8 MM MISC 1 each See admin instructions. Use with insulin 11/18/13   Historical Provider, MD  carvedilol (COREG) 25 MG tablet Take 1 tablet (25 mg total) by mouth 2 (two) times daily with a meal. 10/12/14   Sharon Mt Street, MD  chlorthalidone (HYGROTON) 25 MG tablet Take 1 tablet (25 mg total) by mouth daily. Take at 12:00 noon daily 10/12/14   Sharon Mt [address], MD  cloNIDine (CATAPRES) 0.2 MG tablet Take 1 tablet (0.2 mg total) by mouth 3 (three) times daily. 11/03/14   [place], MD  clopidogrel (PLAVIX) 75 MG tablet Take 1 tablet (75 mg total) by mouth daily. Patient not taking: Reported on 12/07/2014 10/12/14   Sharon Mt [address], MD  glucose blood (ONE TOUCH ULTRA TEST) test strip Check blood sugar three times daily. QS for 1 month supply. Dx 250.02 04/17/14   Zenia Resides, MD  HYDROcodone-acetaminophen (NORCO/VICODIN) 5-325 MG per tablet Take 1-2 tablets by mouth every 6 (six) hours as needed for moderate pain. 12/07/14   Zenia Resides, MD  insulin aspart (NOVOLOG) 100 UNIT/ML injection Inject 15 Units into the skin 3 (three) times daily with meals.  Historical Provider, MD  Insulin Glargine (LANTUS) 100 UNIT/ML Solostar Pen Inject 55 Units into the skin daily. 10/12/14   Zenia Resides, MD  Liraglutide (VICTOZA) 18 MG/3ML SOPN Inject 0.1-0.3 mLs (0.6-1.8 mg total) into the skin daily. Increase in 1 week to 1.2mg  then to 1.8mg  Patient taking differently: Inject 1.8 mg into the skin daily.  10/12/14   Zenia Resides, MD  lisinopril (PRINIVIL,ZESTRIL) 40 MG tablet Take 1 tablet (40 mg total) by mouth daily. 10/12/14   [place], MD  metFORMIN (GLUCOPHAGE) 1000 MG tablet Take 1 tablet (1,000 mg total) by mouth 2 (two) times daily with a  meal. 10/12/14   [place], MD  methocarbamol (ROBAXIN) 500 MG tablet Take 1-2 tablets (500-1,000 mg total) by mouth every 6 (six) hours as needed for muscle spasms. 02/15/15   Waldemar Dickens, MD  nortriptyline (PAMELOR) 10 MG capsule Take 1 capsule (10 mg total) by mouth at bedtime. 02/21/14   Pieter Partridge, DO  NYSTATIN, TOPICAL, POWD Apply up to 3 times daily as needed. 07/13/14   Zenia Resides, MD  pantoprazole (PROTONIX) 40 MG tablet Take 1 tablet (40 mg total) by mouth at bedtime. 07/13/14   Zenia Resides, MD  rosuvastatin (CRESTOR) 10 MG tablet Take 1 tablet (10 mg total) by mouth daily. 07/13/14   Zenia Resides, MD  spironolactone (ALDACTONE) 25 MG tablet Take 1 tablet (25 mg total) by mouth daily. Patient not taking: Reported on 12/07/2014 10/12/14   Zenia Resides, MD   BP 104/70 mmHg  Pulse 82  Temp(Src) 98.2 F (36.8 C) (Oral)  Resp 16  SpO2 100% Physical Exam Physical Exam  Constitutional: oriented to person, place, and time. appears well-developed and well-nourished. No distress.  HENT:  Head: Normocephalic and atraumatic.  Eyes: EOMI. PERRL.  Neck: Normal range of motion.  Cardiovascular: RRR, no m/r/g, 2+ distal pulses,  Pulmonary/Chest: Effort normal and breath sounds normal. No respiratory distress.  Abdominal: Soft. Bowel sounds are normal. NonTTP, no distension.  Musculoskeletal: Right arm with decreased range of motion with abduction. Lift off mildly positive, cross body and Hawkins negative. Diffuse tenderness over the scapular region without noted abnormality..  Neurological: alert and oriented to person, place, and time.  Skin: Skin is warm. No rash noted. non diaphoretic.  Psychiatric: normal mood and affect. behavior is normal. Judgment and thought content normal.   ED Course  Procedures (including critical care time) Labs Review Labs Reviewed - No data to display  Imaging Review No results found.   MDM   1. Shoulder pain, acute,  right    Continue Aleve as tolerated. Start exercises, heat, massage, muscle relaxer/Robaxin. Follow-up with primary care physician or sports medicine as needed. No evidence of recurrence of stroke or other significant medical condition.    Waldemar Dickens, MD 02/15/15 [phone]

## 2015-02-22 ENCOUNTER — Emergency Department (HOSPITAL_COMMUNITY)
Admission: EM | Admit: 2015-02-22 | Discharge: 2015-02-22 | Disposition: A | Discharge status: Home / Self Care | Payer: Medicare Other | Attending: Emergency Medicine | Admitting: Emergency Medicine

## 2015-02-22 ENCOUNTER — Encounter (HOSPITAL_COMMUNITY): Payer: Self-pay | Admitting: *Deleted

## 2015-02-22 ENCOUNTER — Emergency Department (HOSPITAL_COMMUNITY): Payer: Medicare Other

## 2015-02-22 DIAGNOSIS — Z8673 Personal history of transient ischemic attack (TIA), and cerebral infarction without residual deficits: Secondary | ICD-10-CM | POA: Diagnosis not present

## 2015-02-22 DIAGNOSIS — M546 Pain in thoracic spine: Secondary | ICD-10-CM

## 2015-02-22 DIAGNOSIS — E782 Mixed hyperlipidemia: Secondary | ICD-10-CM | POA: Insufficient documentation

## 2015-02-22 DIAGNOSIS — S299XXA Unspecified injury of thorax, initial encounter: Secondary | ICD-10-CM | POA: Diagnosis not present

## 2015-02-22 DIAGNOSIS — E119 Type 2 diabetes mellitus without complications: Secondary | ICD-10-CM | POA: Diagnosis not present

## 2015-02-22 DIAGNOSIS — Z7982 Long term (current) use of aspirin: Secondary | ICD-10-CM | POA: Insufficient documentation

## 2015-02-22 DIAGNOSIS — I10 Essential (primary) hypertension: Secondary | ICD-10-CM | POA: Insufficient documentation

## 2015-02-22 DIAGNOSIS — M25511 Pain in right shoulder: Secondary | ICD-10-CM | POA: Diagnosis not present

## 2015-02-22 DIAGNOSIS — Z79899 Other long term (current) drug therapy: Secondary | ICD-10-CM | POA: Diagnosis not present

## 2015-02-22 DIAGNOSIS — M25519 Pain in unspecified shoulder: Secondary | ICD-10-CM | POA: Diagnosis not present

## 2015-02-22 DIAGNOSIS — Z794 Long term (current) use of insulin: Secondary | ICD-10-CM | POA: Insufficient documentation

## 2015-02-22 DIAGNOSIS — S4991XA Unspecified injury of right shoulder and upper arm, initial encounter: Secondary | ICD-10-CM | POA: Diagnosis not present

## 2015-02-22 DIAGNOSIS — M898X1 Other specified disorders of bone, shoulder: Secondary | ICD-10-CM

## 2015-02-22 DIAGNOSIS — Z7902 Long term (current) use of antithrombotics/antiplatelets: Secondary | ICD-10-CM | POA: Diagnosis not present

## 2015-02-22 MED ORDER — LIDOCAINE 5 % EX PTCH: 1.0000 | MEDICATED_PATCH | CUTANEOUS | Status: DC

## 2015-02-22 NOTE — ED Provider Notes (Signed)
CSN: [id number]     Arrival date & time 02/22/15  1508 History   This chart was scribed for Carla Butts, PA-C working with Orpah Greek, MD by Mercy Moore, ED Scribe. This patient was seen in room TR06C/TR06C and the patient's care was started at 3:57 PM.   Chief Complaint  Patient presents with  . Shoulder Pain   The history is provided by the patient. No language interpreter was used.   HPI Comments: [name] is a 59 y.o. female who presents to the Emergency Department complaining of right scapular shoulder pain for three weeks now. Patient likens pain to something "clawing in or out of her back." Patient reports fall one day prior to onset of pain stating that she fell into wall of her shower. Patient reports stiffening the following morning and worsening pain when playing with her grandchildren. Patient seen in [facility] for right shoulder pain 7/14 for one week after sleeping on her right side; patient denies any change in her pain with treatment. Patient states that she was discharged with Robaxin, but this is not touching her pain. Patient reports additional treatment Tylenol and Aleve, without any relief. Patient reports pain with movement. Patient denies cough or shortness of breath.   Past Medical History  Diagnosis Date  . Hypertension   . Hypertension     Hypertensive urgency 05/2006  . Atrial fibrillation   . Hypercholesteremia   . 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 Diabetes mellitus     type 2   Past Surgical History  Procedure Laterality Date  . Tonsilectomy, adenoidectomy, bilateral myringotomy and tubes  age 42  . Dilation and curettage of uterus  1976  . Meniscus repair  03/09    right knee  . Tonsillectomy     Family History  Problem Relation Age of Onset  . Heart failure Mother   . Diabetes Father   . Hypertension Father   . Lung cancer Sister    History  Substance Use Topics  . Smoking  status: Never Smoker   . Smokeless tobacco: Never Used  . Alcohol Use: No   OB History    No data available     Review of Systems  Constitutional: Negative for fever, chills, diaphoresis, appetite change, fatigue and unexpected weight change.  HENT: Negative for mouth sores.   Eyes: Negative for visual disturbance.  Respiratory: Negative for cough, chest tightness, shortness of breath and wheezing.   Cardiovascular: Negative for chest pain.  Gastrointestinal: Negative for nausea, vomiting, abdominal pain, diarrhea and constipation.  Endocrine: Negative for polydipsia, polyphagia and polyuria.  Genitourinary: Negative for dysuria, urgency, frequency and hematuria.  Musculoskeletal: Positive for myalgias, back pain, joint swelling and arthralgias. Negative for neck pain and neck stiffness.  Skin: Negative for rash and wound.  Allergic/Immunologic: Negative for immunocompromised state.  Neurological: Negative for syncope, weakness, light-headedness, numbness and headaches.  Hematological: Does not bruise/bleed easily.  Psychiatric/Behavioral: Negative for sleep disturbance. The patient is not nervous/anxious.   All other systems reviewed and are negative.     Allergies  Propoxyphene n-acetaminophen  Home Medications   Prior to Admission medications   Medication Sig Start Date End Date Taking? Authorizing Provider  amLODipine (NORVASC) 10 MG tablet Take 1 tablet (10 mg total) by mouth daily. 10/12/14   [place], MD  aspirin EC 81 MG tablet Take 81 mg by mouth daily.    Historical Provider,  MD  B-D ULTRAFINE III SHORT PEN 31G X 8 MM MISC 1 each See admin instructions. Use with insulin 11/18/13   Historical Provider, MD  carvedilol (COREG) 25 MG tablet Take 1 tablet (25 mg total) by mouth 2 (two) times daily with a meal. 10/12/14   Sharon Mt Street, MD  chlorthalidone (HYGROTON) 25 MG tablet Take 1 tablet (25 mg total) by mouth daily. Take at 12:00 noon daily 10/12/14    Sharon Mt Street, MD  cloNIDine (CATAPRES) 0.2 MG tablet Take 1 tablet (0.2 mg total) by mouth 3 (three) times daily. 11/03/14   [place], MD  clopidogrel (PLAVIX) 75 MG tablet Take 1 tablet (75 mg total) by mouth daily. Patient not taking: Reported on 12/07/2014 10/12/14   Sharon Mt Street, MD  glucose blood (ONE TOUCH ULTRA TEST) test strip Check blood sugar three times daily. QS for 1 month supply. Dx 250.02 04/17/14   Zenia Resides, MD  HYDROcodone-acetaminophen (NORCO/VICODIN) 5-325 MG per tablet Take 1-2 tablets by mouth every 6 (six) hours as needed for moderate pain. 12/07/14   Zenia Resides, MD  insulin aspart (NOVOLOG) 100 UNIT/ML injection Inject 15 Units into the skin 3 (three) times daily with meals.     Historical Provider, MD  Insulin Glargine (LANTUS) 100 UNIT/ML Solostar Pen Inject 55 Units into the skin daily. 10/12/14   Zenia Resides, MD  lidocaine (LIDODERM) 5 % Place 1 patch onto the skin daily. Remove & Discard patch within 12 hours or as directed by MD 02/22/15   Jarrett Soho Ovie E[place]p, PA-C  Liraglutide (VICTOZA) 18 MG/3ML SOPN Inject 0.1-0.3 mLs (0.6-1.8 mg total) into the skin daily. Increase in 1 week to 1.2mg  then to 1.8mg  Patient taking differently: Inject 1.8 mg into the skin daily.  10/12/14   Zenia Resides, MD  lisinopril (PRINIVIL,ZESTRIL) 40 MG tablet Take 1 tablet (40 mg total) by mouth daily. 10/12/14   [place], MD  metFORMIN (GLUCOPHAGE) 1000 MG tablet Take 1 tablet (1,000 mg total) by mouth 2 (two) times daily with a meal. 10/12/14   [place], MD  methocarbamol (ROBAXIN) 500 MG tablet Take 1-2 tablets (500-1,000 mg total) by mouth every 6 (six) hours as needed for muscle spasms. 02/15/15   Waldemar Dickens, MD  nortriptyline (PAMELOR) 10 MG capsule Take 1 capsule (10 mg total) by mouth at bedtime. 02/21/14   Pieter Partridge, DO  NYSTATIN, TOPICAL, POWD Apply up to 3 times daily as needed. 07/13/14   Zenia Resides, MD   pantoprazole (PROTONIX) 40 MG tablet Take 1 tablet (40 mg total) by mouth at bedtime. 07/13/14   Zenia Resides, MD  rosuvastatin (CRESTOR) 10 MG tablet Take 1 tablet (10 mg total) by mouth daily. 07/13/14   Zenia Resides, MD  spironolactone (ALDACTONE) 25 MG tablet Take 1 tablet (25 mg total) by mouth daily. Patient not taking: Reported on 12/07/2014 10/12/14   Zenia Resides, MD   Triage Vitals: BP 192/91 mmHg  Pulse 76  Temp(Src) 98.2 F (36.8 C) (Oral)  Resp 18  Ht 5\' 2"  (1.575 m)  Wt 261 lb (118.389 kg)  BMI 47.73 kg/m2  SpO2 99% Physical Exam  Constitutional: She appears well-developed and well-nourished. No distress.  HENT:  Head: Normocephalic and atraumatic.  Mouth/Throat: Oropharynx is clear and moist. No oropharyngeal exudate.  Eyes: Conjunctivae are normal.  Neck: Normal range of motion. Neck supple.  Full ROM without pain  Cardiovascular: Normal rate, regular rhythm,  normal heart sounds and intact distal pulses.   No murmur heard. Pulmonary/Chest: Effort normal and breath sounds normal. No respiratory distress. She has no wheezes.  Equal chest rise Clear and equal breath sounds  Abdominal: Soft. She exhibits no distension. There is no tenderness.  Musculoskeletal: Normal range of motion. She exhibits tenderness (right scapula).  Full range of motion of the T-spine and L-spine No tenderness to palpation of the spinous processes of the T-spine or L-spine  No tenderness to palpation of the paraspinous muscles of the L-spine Tenderness to palpation over right scapula and right paraspinal muscles.  Full ROM of right shoulder with pain only at extremes of motion. No swelling, ecchymosis, rash, or lesion noted over the site where her pain is present.    Lymphadenopathy:    She has no cervical adenopathy.  Neurological: She is alert. She has normal reflexes.  Reflex Scores:      Bicep reflexes are 2+ on the right side and 2+ on the left side.      Brachioradialis  reflexes are 2+ on the right side and 2+ on the left side.      Patellar reflexes are 2+ on the right side and 2+ on the left side.      Achilles reflexes are 2+ on the right side and 2+ on the left side. Speech is clear and goal oriented, follows commands Normal 5/5 strength in upper and lower extremities bilaterally including dorsiflexion and plantar flexion, strong and equal grip strength Sensation normal to light and sharp touch Moves extremities without ataxia, coordination intact Normal gait Normal balance No Clonus   Skin: Skin is warm and dry. No rash noted. She is not diaphoretic. No erythema.  Psychiatric: She has a normal mood and affect. Her behavior is normal.  Nursing note and vitals reviewed.   ED Course  Procedures (including critical care time)  COORDINATION OF CARE: 4:03 PM- Will obtain imaging of scapula. Discussed treatment plan with patient at bedside and patient agreed to plan.   Labs Review Labs Reviewed - No data to display  Imaging Review Dg Chest 2 View  02/22/2015   CLINICAL DATA:  Fall 3 weeks ago. Pain in the region of the right scapula. History of hypertension and atrial fibrillation.  EXAM: CHEST  2 VIEW  COMPARISON:  Chest x-ray dated 06/17/2014  FINDINGS: Cardiomediastinal silhouette remains normal in size and configuration. Lungs are clear. Osseous structures are unremarkable.  No osseous fracture or dislocation seen. Specifically, there is no evidence of fracture within the right scapula or within the adjacent right upper ribs. Small chronic osseous spurring along the undersurface of the right acromioclavicular joint space is unchanged compared to an earlier chest x-ray of 07/10/2013. Mild degenerative change is again noted within the thoracic spine  IMPRESSION: No evidence of acute cardiopulmonary abnormality. Heart size is normal. Lungs are clear.  No osseous fracture or dislocation seen.   Electronically Signed   By: Franki Cabot [name]   On: 02/22/2015  16:39   Dg Scapula Right  02/22/2015   CLINICAL DATA:  Right scapular pain following a fall 3 weeks ago in the shower.  EXAM: RIGHT SCAPULA - 2+ VIEWS  COMPARISON:  None.  FINDINGS: Normal appearing scapula. No fracture or dislocation seen. Mild glenohumeral and AC joint degenerative changes.  IMPRESSION: No fracture or dislocation seen.  Mild degenerative changes.   Electronically Signed   By: Claudie Revering [name]   On: 02/22/2015 16:37     EKG  Interpretation None      MDM   Final diagnoses:  Thoracic back pain  Pain of right scapula   [place] presents with 3 weeks of scapular pain after minor fall.  Patient X-Ray negative for obvious fracture or dislocation of the scapula; CXR without evidence of PNA or other pulmonary component to pt's pain.  Physical exam without evidence of lesions to suggest shingles. Pt without pleuritic pain, tachycardia or SOB; doubt PE.  Pain managed in ED. Pt advised to follow up with orthopedics if symptoms persist.   Conservative therapy recommended and discussed. Patient will be dc home with lidoderm patch & is agreeable with above plan.  BP 192/91 mmHg  Pulse 80  Temp(Src) 98.2 F (36.8 C) (Oral)  Resp 18  Ht 5\' 2"  (1.575 m)  Wt 261 lb (118.389 kg)  BMI 47.73 kg/m2  SpO2 100%  I personally performed the services described in this documentation, which was scribed in my presence. The recorded information has been reviewed and is accurate.   Jarrett Soho Harce Volden, PA-C 02/22/15 [place], MD 02/23/15 [phone]

## 2015-02-22 NOTE — Discharge Instructions (Signed)
1. Medications: lidoderm, usual home medications 2. Treatment: rest, drink plenty of fluids, gentle stretching 3. Follow Up: Please followup with [facility] in 3-5 days for discussion of your diagnoses and further evaluation after today's visit; Please also follow-up with you PCP   Arthralgia Your caregiver has diagnosed you as suffering from an arthralgia. Arthralgia means there is pain in a joint. This can come from many reasons including:  Bruising the joint which causes soreness (inflammation) in the joint.  Wear and tear on the joints which occur as we grow older (osteoarthritis).  Overusing the joint.  Various forms of arthritis.  Infections of the joint. Regardless of the cause of pain in your joint, most of these different pains respond to anti-inflammatory drugs and rest. The exception to this is when a joint is infected, and these cases are treated with antibiotics, if it is a bacterial infection. HOME CARE INSTRUCTIONS   Rest the injured area for as long as directed by your caregiver. Then slowly start using the joint as directed by your caregiver and as the pain allows. Crutches as directed may be useful if the ankles, knees or hips are involved. If the knee was splinted or casted, continue use and care as directed. If an stretchy or elastic wrapping bandage has been applied today, it should be removed and re-applied every 3 to 4 hours. It should not be applied tightly, but firmly enough to keep swelling down. Watch toes and feet for swelling, bluish discoloration, coldness, numbness or excessive pain. If any of these problems (symptoms) occur, remove the ace bandage and re-apply more loosely. If these symptoms persist, contact your caregiver or return to this location.  For the first 24 hours, keep the injured extremity elevated on pillows while lying down.  Apply ice for 15-20 minutes to the sore joint every couple hours while awake for the first half day. Then 03-04  times per day for the first 48 hours. Put the ice in a plastic bag and place a towel between the bag of ice and your skin.  Wear any splinting, casting, elastic bandage applications, or slings as instructed.  Only take over-the-counter or prescription medicines for pain, discomfort, or fever as directed by your caregiver. Do not use aspirin immediately after the injury unless instructed by your physician. Aspirin can cause increased bleeding and bruising of the tissues.  If you were given crutches, continue to use them as instructed and do not resume weight bearing on the sore joint until instructed. Persistent pain and inability to use the sore joint as directed for more than 2 to 3 days are warning signs indicating that you should see a caregiver for a follow-up visit as soon as possible. Initially, a hairline fracture (break in bone) may not be evident on X-rays. Persistent pain and swelling indicate that further evaluation, non-weight bearing or use of the joint (use of crutches or slings as instructed), or further X-rays are indicated. X-rays may sometimes not show a small fracture until a week or 10 days later. Make a follow-up appointment with your own caregiver or one to whom we have referred you. A radiologist (specialist in reading X-rays) may read your X-rays. Make sure you know how you are to obtain your X-ray results. Do not assume everything is normal if you do not hear from [place]. SEEK MEDICAL CARE IF: Bruising, swelling, or pain increases. SEEK IMMEDIATE MEDICAL CARE IF:   Your fingers or toes are numb or blue.  The pain  is not responding to medications and continues to stay the same or get worse.  The pain in your joint becomes severe.  You develop a fever over 102 F (38.9 C).  It becomes impossible to move or use the joint. MAKE SURE YOU:   Understand these instructions.  Will watch your condition.  Will get help right away if you are not doing well or get worse. Document  Released: 07/21/2005 Document Revised: 10/13/2011 Document Reviewed: 03/08/2008 [facility] Patient Information 2015 [place], [place]. This information is not intended to replace advice given to you by your health care provider. Make sure you discuss any questions you have with your health care provider.

## 2015-02-22 NOTE — ED Notes (Signed)
Returned from xray

## 2015-03-14 ENCOUNTER — Encounter: Payer: Self-pay | Admitting: Obstetrics and Gynecology

## 2015-03-14 ENCOUNTER — Ambulatory Visit (INDEPENDENT_AMBULATORY_CARE_PROVIDER_SITE_OTHER): Payer: Medicare Other | Admitting: Obstetrics and Gynecology

## 2015-03-14 VITALS — BP 147/79 | HR 71 | Temp 98.1°F | Ht 62.0 in | Wt 262.0 lb

## 2015-03-14 DIAGNOSIS — E1165 Type 2 diabetes mellitus with hyperglycemia: Secondary | ICD-10-CM | POA: Diagnosis not present

## 2015-03-14 DIAGNOSIS — M898X1 Other specified disorders of bone, shoulder: Secondary | ICD-10-CM | POA: Diagnosis not present

## 2015-03-14 DIAGNOSIS — IMO0002 Reserved for concepts with insufficient information to code with codable children: Secondary | ICD-10-CM

## 2015-03-14 LAB — POCT GLYCOSYLATED HEMOGLOBIN (HGB A1C): HEMOGLOBIN A1C: 8.2

## 2015-03-14 NOTE — Patient Instructions (Signed)
Trigger Point Injection Trigger points are areas where you have muscle pain. A trigger point injection is a shot given in the trigger point to relieve that pain. A trigger point might feel like a knot in your muscle. It hurts to press on a trigger point. Sometimes the pain spreads out (radiates) to other parts of the body. For example, pressing on a trigger point in your shoulder might cause pain in your arm or neck. You might have one trigger point. Or, you might have more than one. People often have trigger points in their upper back and lower back. They also occur often in the neck and shoulders. Pain from a trigger point lasts for a long time. It can make it hard to keep moving. You might not be able to do the exercise or physical therapy that could help you deal with the pain. A trigger point injection may help. It does not work for everyone. But, it may relieve your pain for a few days or a few months. A trigger point injection does not cure long-lasting (chronic) pain. LET YOUR CAREGIVER KNOW ABOUT:  Any allergies (especially to latex, lidocaine, or steroids).  Blood-thinning medicines that you take. These drugs can lead to bleeding or bruising after an injection. They include:  Aspirin.  Ibuprofen.  Clopidogrel.  Warfarin.  Other medicines you take. This includes all vitamins, herbs, eyedrops, over-the-counter medicines, and creams.  Use of steroids.  Recent infections.  Past problems with numbing medicines.  Bleeding problems.  Surgeries you have had.  Other health problems. RISKS AND COMPLICATIONS A trigger point injection is a safe treatment. However, problems may develop, such as:  Minor side effects usually go away in 1 to 2 days. These may include:  Soreness.  Bruising.  Stiffness.  More serious problems are rare. But, they may include:  Bleeding under the skin (hematoma).  Skin infection.  Breaking off of the needle under your skin.  Lung  puncture.  The trigger point injection may not work for you. BEFORE THE PROCEDURE You may need to stop taking any medicine that thins your blood. This is to prevent bleeding and bruising. Usually these medicines are stopped several days before the injection. No other preparation is needed. PROCEDURE  A trigger point injection can be given in your caregiver's office or in a clinic. Each injection takes 2 minutes or less.  Your caregiver will feel for trigger points. The caregiver may use a marker to circle the area for the injection.  The skin over the trigger point will be washed with a germ-killing (antiseptic) solution.  The caregiver pinches the spot for the injection.  Then, a very thin needle is used for the shot. You may feel pain or a twitching feeling when the needle enters the trigger point.  A numbing solution may be injected into the trigger point. Sometimes a drug to keep down swelling, redness, and warmth (inflammation) is also injected.  Your caregiver moves the needle around the trigger zone until the tightness and twitching goes away.  After the injection, your caregiver may put gentle pressure over the injection site.  Then it is covered with a bandage. AFTER THE PROCEDURE  You can go right home after the injection.  The bandage can be taken off after a few hours.  You may feel sore and stiff for 1 to 2 days.  Go back to your regular activities slowly. Your caregiver may ask you to stretch your muscles. Do not do anything that takes   extra energy for a few days.  Follow your caregiver's instructions to manage and treat other pain. Document Released: 07/10/2011 Document Revised: 11/15/2012 Document Reviewed: 07/10/2011 [facility] Patient Information 2015 [place], [place]. This information is not intended to replace advice given to you by your health care provider. Make sure you discuss any questions you have with your health care provider.

## 2015-03-14 NOTE — Assessment & Plan Note (Addendum)
A: Acute localized pain over right scapula. Patient has been evaluated on several occasions for pain in office and ED and diagnosed as having muscle strain. Trigger point appreciated on exam today.  P: -trigger point injection administered with some relief shortly there after -OTC pain mediation prn -handout given -RTC if not improved or worsening in next 1-2 weeks

## 2015-03-14 NOTE — Progress Notes (Signed)
Procedure Note: OPERATION: Trigger Point Injection. ANESTHESIA: Local COMPLICATIONS: None.  DESCRIPTION: The procedure risks, hazards and alternatives were discussed with the patient and a proper consent was obtained. The area over the myofascial spasm was prepped with alcohol utilizing sterile technique. After isolating it between two palpating fingertips a 30-gauge 5" needle was placed in the center of the myofascial spasm and a negative aspiration was performed. Then 2 cc of Lidocaine 1% was injected into trigger point. The patient tolerated the procedure well without any apparent difficulties or complications. They were feeling relief by the time the block had set.

## 2015-03-14 NOTE — Progress Notes (Signed)
   Subjective:   Patient ID: [name], female    DOB: [birth year], 59 y.o.   MRN: [id number]  Patient presents for Same Day Appointment  CC: R. Shoulder Pain  HPI: #EXTREMITY PAIN Woke up one morning felt like something "clawing from inside out" Went to ED and UC and work-up was negative; they did nothing for her pain Imaging was negative Pain around scapula Location: R. Scapula Most intense at night Pain started: last month Constant pain Severity: 8/10, annoying pain  Medications tried: aleve, warm compress, tylenol >>no releif Recent trauma: no Similar pain previously: no  Symptoms Redness: no Swelling: no  Fever: no Weakness: no Rash: no  Review of Systems   See HPI for ROS.   Past medical history, surgical, family, and social history reviewed and updated in the EMR as appropriate.  Objective:  BP 147/79 mmHg  Pulse 71  Temp(Src) 98.1 F (36.7 C) (Oral)  Ht 5\' 2"  (1.575 m)  Wt 262 lb (118.842 kg)  BMI 47.91 kg/m2  Physical Exam  Constitutional: She is oriented to person, place, and time and well-developed, well-nourished, and in no distress.  Musculoskeletal:       Arms: Decreased ROM in bilateral shoulders  Neurological: She is alert and oriented to person, place, and time. She displays no weakness. No sensory deficit.  No focal deficits    Assessment & Plan:  See Problem List Documentation   Luiz Blare, DO 03/14/2015, 10:59 AM PGY-2, [facility]

## 2015-03-14 NOTE — Assessment & Plan Note (Signed)
A1c collected on a same day visit by CMA. Will defer results and management to PCP.

## 2015-03-16 ENCOUNTER — Telehealth: Payer: Self-pay | Admitting: Internal Medicine

## 2015-03-16 NOTE — Telephone Encounter (Signed)
Can you please call patient to schedule an appointment for DM and HTN follow-up within the next month? Thanks!

## 2015-03-16 NOTE — Telephone Encounter (Signed)
-----   Message from Katheren Shams, DO sent at 03/14/2015 11:25 AM EDT -----   ----- Message -----    From: [place] Pink, CMA    Sent: 03/14/2015  10:36 AM      To: Katheren Shams, DO

## 2015-03-19 NOTE — Telephone Encounter (Signed)
Pt was only available on certain days, she is scheduled for 05/10/15 @ 1:30. Veverly Larimer Kennon Holter, CMA

## 2015-03-23 ENCOUNTER — Other Ambulatory Visit: Payer: Self-pay | Admitting: Family Medicine

## 2015-03-28 ENCOUNTER — Other Ambulatory Visit: Payer: Self-pay | Admitting: *Deleted

## 2015-03-28 DIAGNOSIS — IMO0002 Reserved for concepts with insufficient information to code with codable children: Secondary | ICD-10-CM

## 2015-03-28 DIAGNOSIS — E1165 Type 2 diabetes mellitus with hyperglycemia: Secondary | ICD-10-CM

## 2015-03-28 MED ORDER — LIRAGLUTIDE 18 MG/3ML ~~LOC~~ SOPN: 1.8000 mg | PEN_INJECTOR | Freq: Every day | SUBCUTANEOUS | Status: DC

## 2015-04-02 ENCOUNTER — Ambulatory Visit: Payer: Medicare Other | Admitting: Internal Medicine

## 2015-04-03 ENCOUNTER — Other Ambulatory Visit: Payer: Self-pay | Admitting: *Deleted

## 2015-04-03 DIAGNOSIS — E1165 Type 2 diabetes mellitus with hyperglycemia: Secondary | ICD-10-CM

## 2015-04-03 DIAGNOSIS — IMO0002 Reserved for concepts with insufficient information to code with codable children: Secondary | ICD-10-CM

## 2015-04-03 MED ORDER — INSULIN GLARGINE 100 UNIT/ML SOLOSTAR PEN: 55.0000 [IU] | PEN_INJECTOR | Freq: Every day | SUBCUTANEOUS | Status: DC

## 2015-05-01 ENCOUNTER — Telehealth: Payer: Self-pay | Admitting: Internal Medicine

## 2015-05-01 NOTE — Telephone Encounter (Signed)
[facility] called and would like the office notes for the patient. Please fax them to [phone] and if you have questions please call [phone]. [email or web address]

## 2015-05-08 NOTE — Telephone Encounter (Signed)
No orders for home health in patient chart, patient has appointment on 10/6 will clarify with patient before faxing any information.

## 2015-05-10 ENCOUNTER — Ambulatory Visit (INDEPENDENT_AMBULATORY_CARE_PROVIDER_SITE_OTHER): Payer: Medicare Other | Admitting: Internal Medicine

## 2015-05-10 ENCOUNTER — Encounter: Payer: Self-pay | Admitting: Internal Medicine

## 2015-05-10 VITALS — BP 220/102 | HR 76 | Temp 97.7°F | Ht 62.0 in | Wt 265.1 lb

## 2015-05-10 DIAGNOSIS — E1169 Type 2 diabetes mellitus with other specified complication: Secondary | ICD-10-CM | POA: Diagnosis not present

## 2015-05-10 DIAGNOSIS — G4733 Obstructive sleep apnea (adult) (pediatric): Secondary | ICD-10-CM

## 2015-05-10 DIAGNOSIS — R079 Chest pain, unspecified: Secondary | ICD-10-CM

## 2015-05-10 DIAGNOSIS — E119 Type 2 diabetes mellitus without complications: Secondary | ICD-10-CM | POA: Diagnosis present

## 2015-05-10 DIAGNOSIS — R51 Headache: Secondary | ICD-10-CM | POA: Diagnosis not present

## 2015-05-10 DIAGNOSIS — Z794 Long term (current) use of insulin: Secondary | ICD-10-CM

## 2015-05-10 DIAGNOSIS — E785 Hyperlipidemia, unspecified: Secondary | ICD-10-CM

## 2015-05-10 DIAGNOSIS — E118 Type 2 diabetes mellitus with unspecified complications: Secondary | ICD-10-CM

## 2015-05-10 DIAGNOSIS — E1165 Type 2 diabetes mellitus with hyperglycemia: Secondary | ICD-10-CM | POA: Diagnosis not present

## 2015-05-10 DIAGNOSIS — I10 Essential (primary) hypertension: Secondary | ICD-10-CM | POA: Diagnosis not present

## 2015-05-10 DIAGNOSIS — IMO0002 Reserved for concepts with insufficient information to code with codable children: Secondary | ICD-10-CM

## 2015-05-10 DIAGNOSIS — R519 Headache, unspecified: Secondary | ICD-10-CM

## 2015-05-10 MED ORDER — NYSTATIN 100000 UNIT/GM EX POWD: Freq: Three times a day (TID) | CUTANEOUS | Status: DC

## 2015-05-10 MED ORDER — AMLODIPINE BESYLATE 10 MG PO TABS: 10.0000 mg | ORAL_TABLET | Freq: Every day | ORAL | Status: DC

## 2015-05-10 MED ORDER — CHLORTHALIDONE 25 MG PO TABS: 25.0000 mg | ORAL_TABLET | Freq: Every day | ORAL | Status: DC

## 2015-05-10 MED ORDER — INSULIN ASPART 100 UNIT/ML ~~LOC~~ SOLN: 15.0000 [IU] | Freq: Three times a day (TID) | SUBCUTANEOUS | Status: DC

## 2015-05-10 MED ORDER — CLOPIDOGREL BISULFATE 75 MG PO TABS: 75.0000 mg | ORAL_TABLET | Freq: Every day | ORAL | Status: DC

## 2015-05-10 MED ORDER — ROSUVASTATIN CALCIUM 10 MG PO TABS: 10.0000 mg | ORAL_TABLET | Freq: Every day | ORAL | Status: DC

## 2015-05-10 MED ORDER — OMEPRAZOLE 20 MG PO CPDR: 20.0000 mg | DELAYED_RELEASE_CAPSULE | Freq: Two times a day (BID) | ORAL | Status: DC

## 2015-05-10 MED ORDER — GLUCOSE BLOOD VI STRP: ORAL_STRIP | Status: DC

## 2015-05-10 MED ORDER — KETOROLAC TROMETHAMINE 60 MG/2ML IM SOLN
60.0000 mg | Freq: Once | INTRAMUSCULAR | Status: AC
  Administered 2015-05-10: 60 mg via INTRAMUSCULAR

## 2015-05-10 MED ORDER — LIRAGLUTIDE 18 MG/3ML ~~LOC~~ SOPN: 1.8000 mg | PEN_INJECTOR | Freq: Every day | SUBCUTANEOUS | Status: DC

## 2015-05-10 MED ORDER — INSULIN GLARGINE 100 UNIT/ML SOLOSTAR PEN: 55.0000 [IU] | PEN_INJECTOR | Freq: Every day | SUBCUTANEOUS | Status: DC

## 2015-05-10 MED ORDER — CARVEDILOL 25 MG PO TABS: 25.0000 mg | ORAL_TABLET | Freq: Two times a day (BID) | ORAL | Status: DC

## 2015-05-10 MED ORDER — INSULIN PEN NEEDLE 31G X 8 MM MISC: 1.0000 | Status: DC

## 2015-05-10 MED ORDER — LISINOPRIL 40 MG PO TABS: ORAL_TABLET | ORAL | Status: DC

## 2015-05-10 NOTE — Assessment & Plan Note (Signed)
-   Refilled Crestor - Will draw labs at one month follow-up visit

## 2015-05-10 NOTE — Progress Notes (Signed)
Subjective:    Patient ID: [name], female    DOB: 07-11-1956, 59 y.o.   MRN: [id number]  HPI  Ms. Loring is a 59 yo F with extensive PMH significant for Type II DM, HTN, HLD, and hx CVA (2010).   Patient reports that for the past six months, she has not been taking any of her medications. She lost her insurance in April, and only had one month's worth of all of her prescriptions. She gained insurance coverage again two months ago, however she never went to the pharmacy to refill her prescriptions. She said her memory has declined since she had a stroke six years ago, and she does not easily remember things like picking up prescriptions.   She reports a constant HA that is present during our encounter. She describes the HA as a sharp pain in one area, that will move to a different area after she gets agitated or emotional (for example, the pain will move to a different point of her head after yelling at one of her grandchildren). She has taken Aleve for this pain, and says it helps for about an hour. She has a history of migraines, but says this pain feels different than her migraines. Standing up or sitting up quickly may also precipitate a HA. She has associated blurry vision, and a feeling of tightness in her eyes, as though her eyes may "pop out."   She also reports chest pain and tightness, though she says this is not new. She formerly took nitroglycerin tablets for this, but she has been out for years. She sees a cardiologist regularly. She describes the pain as feeling like pins and needles in the middle of her chest. It worsens with exertion and improves with rest.   She denies any numbness, but endorses L-sided weakness which she attributes to her stroke.   Review of Systems  Eyes: Positive for pain and visual disturbance.  Respiratory: Positive for chest tightness.   Cardiovascular: Positive for chest pain. Negative for palpitations and leg swelling.  Gastrointestinal:  Positive for constipation. Negative for abdominal pain.  Neurological: Positive for weakness and headaches. Negative for numbness.       Objective:   Physical Exam  Constitutional: She is oriented to person, place, and time.  Obese, in no acute distress  HENT:  Head: Normocephalic and atraumatic.  Eyes: EOM are normal. Pupils are equal, round, and reactive to light. Right eye exhibits no discharge. Left eye exhibits no discharge. No scleral icterus.  Arcus senilis present bilaterally  Cardiovascular: Normal rate, regular rhythm and normal heart sounds.   No murmur heard. Pulmonary/Chest: Effort normal and breath sounds normal. No respiratory distress. She has no wheezes.  Abdominal: Soft. Bowel sounds are normal. She exhibits no distension. There is no tenderness.  Musculoskeletal: She exhibits no edema or tenderness.  Neurological: She is alert and oriented to person, place, and time. No cranial nerve deficit. Coordination normal.  5/5 strength bilateral upper and lower extremities   Skin: Skin is warm and dry.  Psychiatric: She has a normal mood and affect. Her behavior is normal.  Vitals reviewed.      Assessment & Plan:  Essential hypertension BP increased today to 220/102 with repeat of 198/109, however patient has not taken medication in 6 months - Refilled norvasc, coreg, chlorthalidone, and lisinopril - Did not refill clonidine today, but will reconsider at follow-up appointment - Patient will return in ~4 days (Monday) for BP recheck, then will follow-up  with me in one mont for med adjustment if necessary   Mild obstructive sleep apnea Patient has CPAP but has not used in months because her grandson broke one of the parts - Will readdress at follow-up visit   Diabetes mellitus out of control Patient has not taken medication in six months. Surprisingly, A1C drawn last month (at that point patient would not have taken medication for 5 months) was 8.2 - Refilled test  strips, Novolog, Lantus, and Victoza - Will follow-up in one month  Hyperlipidemia associated with type 2 diabetes mellitus - Refilled Crestor - Will draw labs at one month follow-up visit   Headache Patient believes HA today is due to increased blood pressure. Considered clonidine to decrease BP and possibly relieve HA, but given that patient has not been taking clonidine for six months, was concerned about rebound HTN.  - Toradol given in office for pain relief - Will readdress at follow-up  Chest pain Patient followed by cardiologist and has been seen recently - Continue current med regimen - Will consider prescribing nitroglycerin at next visit

## 2015-05-10 NOTE — Assessment & Plan Note (Signed)
Patient has not taken medication in six months. Surprisingly, A1C drawn last month (at that point patient would not have taken medication for 5 months) was 8.2 - Refilled test strips, Novolog, Lantus, and Victoza - Will follow-up in one month

## 2015-05-10 NOTE — Assessment & Plan Note (Signed)
Patient believes HA today is due to increased blood pressure. Considered clonidine to decrease BP and possibly relieve HA, but given that patient has not been taking clonidine for six months, was concerned about rebound HTN.  - Toradol given in office for pain relief - Will readdress at follow-up

## 2015-05-10 NOTE — Patient Instructions (Signed)
It was very nice meeting you today!  It is VERY important to pick up your medications from the pharmacy TODAY.  Please make sure to come to your appointment on Monday or Tuesday for a blood pressure check. Also, it is important to come to your follow-up appointment with me in one month so we can make changes to your medications if needed.   If your headache becomes worse, if you have worsening chest pain, or if you start to have more vision changes, please call our office or go to the emergency room right away.   If you have any questions, please do not hesitate to call us.   Thank you, Dr. Avon Gully

## 2015-05-10 NOTE — Assessment & Plan Note (Signed)
Patient has CPAP but has not used in months because her grandson broke one of the parts - Will readdress at follow-up visit

## 2015-05-10 NOTE — Assessment & Plan Note (Addendum)
BP increased today to 220/102 with repeat of 198/109, however patient has not taken medication in 6 months - Refilled norvasc, coreg, chlorthalidone, and lisinopril - Did not refill clonidine today, but will reconsider at follow-up appointment - Patient will return in ~4 days (Monday) for BP recheck, then will follow-up with me in one mont for med adjustment if necessary

## 2015-05-10 NOTE — Assessment & Plan Note (Signed)
Patient followed by cardiologist and has been seen recently - Continue current med regimen - Will consider prescribing nitroglycerin at next visit

## 2015-05-14 ENCOUNTER — Ambulatory Visit (INDEPENDENT_AMBULATORY_CARE_PROVIDER_SITE_OTHER): Payer: Medicare Other | Admitting: *Deleted

## 2015-05-14 VITALS — BP 182/110 | HR 68

## 2015-05-14 DIAGNOSIS — I10 Essential (primary) hypertension: Secondary | ICD-10-CM

## 2015-05-14 DIAGNOSIS — Z136 Encounter for screening for cardiovascular disorders: Secondary | ICD-10-CM

## 2015-05-14 DIAGNOSIS — Z0130 Encounter for examination of blood pressure without abnormal findings: Secondary | ICD-10-CM

## 2015-05-14 NOTE — Telephone Encounter (Signed)
[name] called again about the notes from pt chart about Cpap use. Please fax  [phone]

## 2015-05-14 NOTE — Progress Notes (Signed)
   Patient in nurse clinic for blood pressure check.  Blood pressure 200/110 left arm manually.  Patient stated she has a constant muscle ache in the middle of her chest and headache.  Patient denies any SOB, dizziness, numbness/tingling of extremities, n/v, abdominal pain or visual changes.  Patient stated that her grandson has chewed a small hole in her CPAP tubing.  Advised patient to contact company where she got the machine to see if they can provide a replacement tubing.  Blood pressure recheck 182/110 manually.  Patient stated she can not walk but a few steps at a time and have to stop and catch her breathe.  Advised patient that she need to follow with PCP sooner than 06/11/15.  Spoke with [name]; agreed to see patient this week.  Appointment scheduled for 05/18/15 per patient request at 9 AM.  Patient advised if chest pain worsen or she develops any other symptom to call clinic for an appointment or go to ED.  Will forward to PCP.  Derl Barrow, RN

## 2015-05-16 NOTE — Telephone Encounter (Signed)
Last OV note faxed to number provided  

## 2015-05-18 ENCOUNTER — Encounter: Payer: Self-pay | Admitting: Pharmacist

## 2015-05-18 ENCOUNTER — Ambulatory Visit (INDEPENDENT_AMBULATORY_CARE_PROVIDER_SITE_OTHER): Payer: Medicare Other | Admitting: Pharmacist

## 2015-05-18 VITALS — BP 178/90 | HR 71 | Ht 63.0 in | Wt 264.0 lb

## 2015-05-18 DIAGNOSIS — I10 Essential (primary) hypertension: Secondary | ICD-10-CM | POA: Diagnosis not present

## 2015-05-18 DIAGNOSIS — E0869 Diabetes mellitus due to underlying condition with other specified complication: Secondary | ICD-10-CM | POA: Diagnosis not present

## 2015-05-18 DIAGNOSIS — Z794 Long term (current) use of insulin: Secondary | ICD-10-CM | POA: Diagnosis not present

## 2015-05-18 DIAGNOSIS — E0865 Diabetes mellitus due to underlying condition with hyperglycemia: Secondary | ICD-10-CM

## 2015-05-18 DIAGNOSIS — IMO0002 Reserved for concepts with insufficient information to code with codable children: Secondary | ICD-10-CM

## 2015-05-18 MED ORDER — SPIRONOLACTONE 25 MG PO TABS: 25.0000 mg | ORAL_TABLET | Freq: Every day | ORAL | Status: DC

## 2015-05-18 NOTE — Progress Notes (Signed)
S:    Patient arrives walking with assistance of a cane. Presents for reevaluation of blood pressure  Patient reports non-adherence with medications. Current only taking insulin, 4 of her bp medication, plavix and pantoprazole.  She reports this has been going on since she had a lapse in her health insurance.  She states she recently got Medicare including medication coverage.  She plans to go to the pharmacy to pick up other medications after this visit. Note: She has been taking insulin from another person (has NOT run out of her insulin).  Admits non-adherence has been due to feeling stressed, anxious and worried.  Willing to be evaluated and assisted today by same day support service.   Patient denies hypoglycemic events. Reports blood sugars have been 100-200 with few readings > 200 despite NOT takign Victoza for 2-3 months.   O:  Lab Results  Component Value Date   HGBA1C 8.2 03/14/2015    A/P: Diabetes currently with fair control despite insurance challenges as she has a supply of insulin from another person.   Patient denies hypoglycemic events and is able to verbalize appropriate hypoglycemia management plan.  Patient reports adherence with insulin however she has not been able to afford her Vicotza.  Control is suboptimal due to not taking Victoza - will restart this medication gradually and then reevaluate blood glucose control.  Hypertension, longstanding currently with poor control secondary to poor adherence related to insurance coverage gap that appears to have recently been resolved. Restart carvedilol at 12.5mg  BID for one week (splitting the 25mg  tablets) then increase to 25mg  BID.   Restart spironolactone 25mg  daily.   Reassess blood pressure control in 10-14 days in pharmacy clinic. Stressed importance of adherence. Requested patient to bring all medications to the next visit.     Support appreciated by same day evaluation and counseling service Elton Sin).  Written patient  instructions provided.  Total time in face to face counseling 35 minutes.   Follow up in Pharmacist Clinic Visit 10-14 days  Patient seen with Viann Fish, PharmD Candidate.

## 2015-05-18 NOTE — Assessment & Plan Note (Signed)
Diabetes currently with fair control despite insurance challenges as she has a supply of insulin from another person.   Patient denies hypoglycemic events and is able to verbalize appropriate hypoglycemia management plan.  Patient reports adherence with insulin however she has not been able to afford her Vicotza.  Control is suboptimal due to not taking Victoza - will restart this medication gradually and then reevaluate blood glucose control.

## 2015-05-18 NOTE — Patient Instructions (Addendum)
Remember to go to your pharmacy to pick up your medications.    Restart Victoza at 0.6 mg daily for 3-5 days, then increase to 1.2 mg daily for 3-5 day, and then 1.8 mg daily as tolerated. Restart Carvedilol at 12.5 mg twice daily with food. Restart Spironolactone 25 mg once daily.  Follow-up visit with [name] in 10-14 days

## 2015-05-18 NOTE — Progress Notes (Signed)
[name] requested a [place].   Presenting Issue: [name] presents with difficulty coping with significant life stressors.  Report of symptoms: Patient reports feeling "stressed" and overwhelmed due to numerous stressful environmental circumstances. She also reports that she is frequently angry and annoyed, and "has trouble letting things go." Last, she reports difficulty sleeping.  Impact on function: Patient reports that   Psychiatric History - Diagnoses: Depression (per chart), Other Social Stressor (per chart) - Outpatient therapy: Patient does not meet with a therapist, but receives emotional support from a bishop at her church.  Current and history of substance use:  Patient reports a history of alcohol abuse, but states that she has abstained from alcohol use for the past 6 years.  Medical conditions that might explain or contribute to symptoms: Patient has hypertension, which may contribute to feelings of anxiety (and may reciprocally be exacerbated by anxiety).  Assessment / Plan / Recommendations:Patient reports that she has numerous environmental stressors contributing to feelings of anxiety. For example, she is the primary caregiver for her grandson, who she reports has behavioral problems. She also has difficulty with her neighbors, and until recently, did not have financial means to purchase medical supplies (she recently had Medicaid reinstated). As a result, she typically feels overwhelmed and is quick to feel angry or annoyed. She is also experiencing difficulty sleeping - she reports that she sometimes goes to bed at 9:00 p.m. and is not able to sleep until 2:00 a.m. When trying to sleep, she lays in bed and watches t.v. She is also eating and drinking Pepsi max to cope with stress, and drinks several 2-liters of Pepsi Max per week, including at night, which may be contributing to sleep difficulties. [facility] engaged in motivational interviewing to discuss  cessation of stress-eating and replace with diaphragmatic breathing. Patient said she was willing to try diaphragmatic breathing for "at least a few days". Regarding sleep, [place] discussed decreasing patient's consumption of Pepsi Max, especially at night; however, patient denied that it contains caffeine or contributes to her sleep difficulty. [facility] discussed sleep hygiene, including getting out of bed for several minutes and completing a boring activity (e.g., completing a cross-word puzzle) instead of watching television. Patient was receptive to psychoeducation and agreed to implement sleep hygiene strategies. Last, patient agreed to walk more each day. At the end of the appointment, patient repeated her action plan (practicing diaphragmatic breathing, getting out of bed and not watching television when she can't sleep, and walking 30 minutes per day) back to the [facility].

## 2015-05-18 NOTE — Assessment & Plan Note (Signed)
Hypertension, longstanding currently with poor control secondary to poor adherence related to insurance coverage gap that appears to have recently been resolved. Restart carvedilol at 12.5mg  BID for one week (splitting the 25mg  tablets) then increase to 25mg  BID.   Restart spironolactone 25mg  daily.   Reassess blood pressure control in 10-14 days in pharmacy clinic. Stressed importance of adherence. Requested patient to bring all medications to the next visit.

## 2015-05-21 NOTE — Progress Notes (Addendum)
Patient ID: [name], female   DOB: 1955/10/01, 59 y.o.   MRN: [id number] Reviewed: agree with Dr. Graylin Shiver and Dr. Tod Persia documentation and management.

## 2015-05-25 ENCOUNTER — Telehealth: Payer: Self-pay | Admitting: Psychology

## 2015-05-25 NOTE — Telephone Encounter (Signed)
Called patient to check on status and see if she was interested in scheduling a follow-up appointment. Left message saying I was checking-in on whether she would like a follow-up appointment. Also stated that I researched Pepsi Max and found out that it has almost double the caffeine of regular Pepsi, and that she might benefit from refraining from drinking it after 6 pm and tapering her consumption in general. Stated that if she would like a follow-up appointment, she should call the front desk and requested an integrated care appointment.

## 2015-05-31 ENCOUNTER — Encounter: Payer: Self-pay | Admitting: Pharmacist

## 2015-05-31 ENCOUNTER — Ambulatory Visit (INDEPENDENT_AMBULATORY_CARE_PROVIDER_SITE_OTHER): Payer: Medicare Other | Admitting: Pharmacist

## 2015-05-31 VITALS — BP 138/81 | HR 77 | Ht 63.0 in | Wt 261.4 lb

## 2015-05-31 DIAGNOSIS — I480 Paroxysmal atrial fibrillation: Secondary | ICD-10-CM

## 2015-05-31 DIAGNOSIS — I10 Essential (primary) hypertension: Secondary | ICD-10-CM

## 2015-05-31 MED ORDER — RIVAROXABAN 20 MG PO TABS: 20.0000 mg | ORAL_TABLET | Freq: Every day | ORAL | Status: DC

## 2015-05-31 NOTE — Patient Instructions (Addendum)
Thank you for coming in today!   Continue to take Lantus 55 units daily and increase Novolog to 20 units with meals.    Start taking Xarelto 20mg  daily.    Follow up with Dr. Avon Gully on 06/11/2015 at 10:00 AM.

## 2015-05-31 NOTE — Assessment & Plan Note (Addendum)
Patient has history of Atrial Fibrillation with reports of transient facial numbness and drooling.  No current signs or symptoms of facial drooping.  Started patient on Xarelto (rivaroxaban) 20 mg once daily with food.  Patient educated on purpose, proper use, and potential adverse effects including the signs and symptoms of bleeding.  Patient provided with 15 day supply of samples.  Reconsider combination therapy with dual antiplatelet therapy at next PCP visit.   Patient is currently taking PPI for GI prophylaxis.

## 2015-05-31 NOTE — Assessment & Plan Note (Signed)
Diabetes currently improving.   Patient denies hypoglycemic events and is able to verbalize appropriate hypoglycemia management plan.  Patient reports adherence with medication. Control is suboptimal due to body weight, diet and lack of exercise. Continued basal insulin Lantus (insulin glargine) 55 units daily. Increased dose of rapid insulin Novolog (insulin aspart) to 20 units with meals.  Continued Victoza (liraglutide) to 1.8mg  daily. Next A1C anticipated in 1-3 months.

## 2015-05-31 NOTE — Assessment & Plan Note (Signed)
Hypertension is currently controlled.  Patient reports adherence to all medications.  Patient currently taking chlorthalidone at night, possibly contributing to nocturia.  Advised to take in the morning.  Will continue amlodipine, carvedilol, chlorthalidone, lisinopril, and spironolactone.  Patient's last BMP in June, recommend obtaining BMP at next visit.

## 2015-05-31 NOTE — Progress Notes (Signed)
Patient ID: [name], female   DOB: Dec 09, 1955, 59 y.o.   MRN: [id number] Reviewed: Agree with Dr. Graylin Shiver documentation and management.

## 2015-05-31 NOTE — Progress Notes (Signed)
S:    Patient arrives walking with assistance of a cane.  Presents for blood pressure and diabetes follow up.    Patient reports adherence with medications. Current diabetes medications include Lantus 55 units daily, Novolog 15 units before meals, and Victoza 1.8mg  daily.  Current hypertension medications include amlodipine, carvedilol, chlorthalidone, lisinopril, and spironolactone.    Patient denies hypoglycemic events.  Patient reported dietary habits:  Likes to eat fried foods, does not drink water. Drinks Pepsi Max (diet).    Patient reported exercise habits:  None    Patient reports nocturia (6-7 times per night).  Patient denies neuropathy.  Patient reports visual changes.  Patient denies self foot exams.  Sees podiatrist once to twice per year.   Patient reports having episodes of numbness on one side of her face and drooling out of side of her mouth.     O:  Lab Results  Component Value Date   HGBA1C 8.2 03/14/2015    Home fasting CBG: 150 to 200s 2 hour post-prandial/random CBG:  150 to 250s  Today's Blood Pressure: 138/81  CHADS2VASc = 6  A/P: Diabetes currently improving.   Patient denies hypoglycemic events and is able to verbalize appropriate hypoglycemia management plan.  Patient reports adherence with medication. Control is suboptimal due to body weight, diet and lack of exercise. Continued basal insulin Lantus (insulin glargine) 55 units daily. Increased dose of rapid insulin Novolog (insulin aspart) to 20 units with meals.  Continued Victoza (liraglutide) to 1.8mg  daily. Next A1C anticipated in 1-3 months.    Hypertension is currently controlled.  Patient reports adherence to all medications.  Patient currently taking chlorthalidone at night, possibly contributing to nocturia.  Advised to take in the morning.  Will continue amlodipine, carvedilol, chlorthalidone, lisinopril, and spironolactone.  Patient's last BMP in June, recommend obtaining BMP at next  visit.  Patient has history of Atrial Fibrillation with reports of transient facial numbness and drooling.  No current signs or symptoms of facial drooping.  Started patient on Xarelto (rivaroxaban) 20 mg once daily with food.  Patient educated on purpose, proper use, and potential adverse effects including the signs and symptoms of bleeding.  Patient provided with 15 day supply of samples.  Written patient instructions provided.  Total time in face to face counseling 30 minutes.   Follow up with Dr. Avon Gully on June 11, 2015.   Patient seen with Viann Fish, PharmD Candidate, Dimitri Ped, PharmD Resident.and Elisabeth Most, PharmD, resident.  Marland Kitchen

## 2015-06-11 ENCOUNTER — Ambulatory Visit (INDEPENDENT_AMBULATORY_CARE_PROVIDER_SITE_OTHER): Payer: Medicare Other | Admitting: Internal Medicine

## 2015-06-11 VITALS — BP 130/82 | HR 83 | Temp 97.9°F | Wt 257.0 lb

## 2015-06-11 DIAGNOSIS — G4733 Obstructive sleep apnea (adult) (pediatric): Secondary | ICD-10-CM

## 2015-06-11 DIAGNOSIS — E118 Type 2 diabetes mellitus with unspecified complications: Secondary | ICD-10-CM | POA: Diagnosis present

## 2015-06-11 DIAGNOSIS — Z8673 Personal history of transient ischemic attack (TIA), and cerebral infarction without residual deficits: Secondary | ICD-10-CM | POA: Diagnosis not present

## 2015-06-11 DIAGNOSIS — I480 Paroxysmal atrial fibrillation: Secondary | ICD-10-CM | POA: Diagnosis not present

## 2015-06-11 DIAGNOSIS — E1165 Type 2 diabetes mellitus with hyperglycemia: Secondary | ICD-10-CM

## 2015-06-11 DIAGNOSIS — I10 Essential (primary) hypertension: Secondary | ICD-10-CM | POA: Diagnosis not present

## 2015-06-11 DIAGNOSIS — K219 Gastro-esophageal reflux disease without esophagitis: Secondary | ICD-10-CM | POA: Diagnosis not present

## 2015-06-11 DIAGNOSIS — Z0000 Encounter for general adult medical examination without abnormal findings: Secondary | ICD-10-CM | POA: Diagnosis not present

## 2015-06-11 DIAGNOSIS — R1084 Generalized abdominal pain: Secondary | ICD-10-CM | POA: Diagnosis not present

## 2015-06-11 DIAGNOSIS — G4762 Sleep related leg cramps: Secondary | ICD-10-CM | POA: Diagnosis not present

## 2015-06-11 DIAGNOSIS — IMO0002 Reserved for concepts with insufficient information to code with codable children: Secondary | ICD-10-CM

## 2015-06-11 DIAGNOSIS — R109 Unspecified abdominal pain: Secondary | ICD-10-CM | POA: Insufficient documentation

## 2015-06-11 DIAGNOSIS — Z794 Long term (current) use of insulin: Secondary | ICD-10-CM | POA: Diagnosis not present

## 2015-06-11 DIAGNOSIS — Z23 Encounter for immunization: Secondary | ICD-10-CM

## 2015-06-11 LAB — CBC WITH DIFFERENTIAL/PLATELET
BASOS PCT: 0 % (ref 0–1)
Basophils Absolute: 0 10*3/uL (ref 0.0–0.1)
EOS PCT: 1 % (ref 0–5)
Eosinophils Absolute: 0.1 10*3/uL (ref 0.0–0.7)
HCT: 39.2 % (ref 36.0–46.0)
Hemoglobin: 12.3 g/dL (ref 12.0–15.0)
LYMPHS PCT: 44 % (ref 12–46)
Lymphs Abs: 3.3 10*3/uL (ref 0.7–4.0)
MCH: 22.8 pg — AB (ref 26.0–34.0)
MCHC: 31.4 g/dL (ref 30.0–36.0)
MCV: 72.6 fL — ABNORMAL LOW (ref 78.0–100.0)
MONO ABS: 0.5 10*3/uL (ref 0.1–1.0)
MPV: 10.1 fL (ref 8.6–12.4)
Monocytes Relative: 7 % (ref 3–12)
Neutro Abs: 3.6 10*3/uL (ref 1.7–7.7)
Neutrophils Relative %: 48 % (ref 43–77)
Platelets: 319 10*3/uL (ref 150–400)
RBC: 5.4 MIL/uL — AB (ref 3.87–5.11)
RDW: 14.6 % (ref 11.5–15.5)
WBC: 7.6 10*3/uL (ref 4.0–10.5)

## 2015-06-11 LAB — COMPREHENSIVE METABOLIC PANEL
ALBUMIN: 3.6 g/dL (ref 3.6–5.1)
ALT: 13 U/L (ref 6–29)
AST: 15 U/L (ref 10–35)
Alkaline Phosphatase: 114 U/L (ref 33–130)
BILIRUBIN TOTAL: 0.3 mg/dL (ref 0.2–1.2)
BUN: 18 mg/dL (ref 7–25)
CO2: 24 mmol/L (ref 20–31)
Calcium: 8.8 mg/dL (ref 8.6–10.4)
Chloride: 99 mmol/L (ref 98–110)
Creat: 1.03 mg/dL — ABNORMAL HIGH (ref 0.50–0.99)
Glucose, Bld: 354 mg/dL — ABNORMAL HIGH (ref 65–99)
Potassium: 4.5 mmol/L (ref 3.5–5.3)
Sodium: 135 mmol/L (ref 135–146)
Total Protein: 7 g/dL (ref 6.1–8.1)

## 2015-06-11 LAB — POCT GLYCOSYLATED HEMOGLOBIN (HGB A1C): Hemoglobin A1C: 8.7

## 2015-06-11 LAB — TSH: TSH: 2.441 u[IU]/mL (ref 0.350–4.500)

## 2015-06-11 MED ORDER — INSULIN ASPART 100 UNIT/ML ~~LOC~~ SOLN: 20.0000 [IU] | Freq: Three times a day (TID) | SUBCUTANEOUS | Status: DC

## 2015-06-11 MED ORDER — OMEPRAZOLE 20 MG PO CPDR: 20.0000 mg | DELAYED_RELEASE_CAPSULE | Freq: Two times a day (BID) | ORAL | Status: DC

## 2015-06-11 MED ORDER — SPIRONOLACTONE 25 MG PO TABS: 25.0000 mg | ORAL_TABLET | Freq: Every day | ORAL | Status: DC

## 2015-06-11 MED ORDER — LISINOPRIL 40 MG PO TABS: ORAL_TABLET | ORAL | Status: DC

## 2015-06-11 MED ORDER — RIVAROXABAN 20 MG PO TABS: 20.0000 mg | ORAL_TABLET | Freq: Every day | ORAL | Status: DC

## 2015-06-11 MED ORDER — INSULIN GLARGINE 100 UNIT/ML SOLOSTAR PEN: 55.0000 [IU] | PEN_INJECTOR | Freq: Every day | SUBCUTANEOUS | Status: DC

## 2015-06-11 MED ORDER — LIRAGLUTIDE 18 MG/3ML ~~LOC~~ SOPN: 1.8000 mg | PEN_INJECTOR | Freq: Every day | SUBCUTANEOUS | Status: DC

## 2015-06-11 NOTE — Assessment & Plan Note (Addendum)
Stable. Last echo one year ago with EF of 55-60%. CHADS VASc 6.  - Continue Xarelto 20 mg qd

## 2015-06-11 NOTE — Progress Notes (Signed)
Subjective:    Patient ID: [name], female    DOB: Mar 10, 1956, 59 y.o.   MRN: [id number]  HPI  [name] is a 59 yo F with extensive PMH history presenting for follow-up.   A.fib Patient was started on Xarelto 20 mg qd at last appointment, however she was concerned this may be causing leg cramps, so she only took the medication for four days. She has also stopped taking ASA and Plavix, so has been on no anti-coagulation at all recently. Has infrequent chest pain with exertion, but this is not new.   Essential hypertension As suggested at last visit, patient has started taking chlorthalidone in AM. This has greatly decreased nocturia. No HA, changes in vision.   OSA Patient is not currently using CPAP at night because her grandson broke the hose. She is having difficulty sleeping due to leg cramps, so she cannot tell if her sleep is worsened without use of the CPAP.   Type II DM Denies any symptoms of hypoglycemia. Does not check blood glucose regularly.   History of CVA Patient reports recent episodes of numbness and drooping of her face. She says this has been happening for a few months 2-3 times per month. The episodes last approximately 5 minutes. Her face becomes numb on the L and the L side of her mouth starts to droop. It resolves spontaneously. She denies numbness, tingling, or paralysis elsewhere. Not taking ASA or Plavix as previously prescribed.   Abdominal pain Began 3 weeks ago. Is now occurring almost every day. Pain begins above umbilicus and quickly radiates to RUQ. She describes the pain as feeling like someone is eating her from the inside. Has alternating constipation and diarrhea, but says this is not a new problem and does not seem to be related to the pain. Denies N/V. Has not taken anything to try to improve the pain.   Leg cramps Began about a week ago. Started after she began Xarelto, so she believes it is related to Xarelto. Occurs almost every night  now. Pain begins in her thighs bilaterally, then travels down her legs to her calves. Sitting on the side of the bed and walking around improves the pain somewhat, but eventually the pain resolves spontaneously. She denies any symptoms during the day. She denies feelings of restlessness or feeling like she needs to move her legs.   Review of Systems See HPI.    Objective:   Physical Exam  Constitutional: She appears well-developed and well-nourished. No distress.  HENT:  Head: Normocephalic and atraumatic.  Cardiovascular: Normal rate, regular rhythm and normal heart sounds.   No murmur heard. Pulmonary/Chest: Effort normal and breath sounds normal. No respiratory distress. She has no wheezes.  Abdominal: Soft. Bowel sounds are normal. She exhibits no distension. There is tenderness (TTP of RUQ; no tenderness in umbilical region). There is no rebound and no guarding.  Musculoskeletal: She exhibits edema (Trace bilatearlly). She exhibits no tenderness.  Neurological: She is alert.  Psychiatric: She has a normal mood and affect. Her behavior is normal.      Assessment & Plan:  Atrial fibrillation Stable. Last echo one year ago with EF of 55-60%. CHADS VASc 6.  - Continue Xarelto 20 mg qd  Essential hypertension Improving. BP 130/82 today.  - Cont chlorthalidone (in the AM), amlodipine, carvedilol, lisinopril, and spironolactone  Mild obstructive sleep apnea Stable. Patient is going to the medical supply store today to pick up replacement hose for CPAP.  Diabetes  mellitus out of control Stable.  - Repeat A1C today - Patient to schedule appt with [facility]  - Continue Lantus 55U qd, Novolog 20U with meals, Victoza 1.8mg  qd  History of CVA (cerebrovascular accident) Patient currently on aspirin and Plavix. Given increased risk of bleeding with two anti-platelets but no significant benefit of dual therapy, will discontinue aspirin.  - Continue Plavix  Abdominal  pain New problem. Given location and description of pain, concern for cholelithiasis/cholecystitis.  - CMP today to look for abnormal LFTs - Will consider RUQ [place] at follow-up appointment if pain has continued/labs are abnormal  Leg cramps, sleep related New problem. Differential includes hypokalemia, anemia, or thyroid abnormalities.  - CMP, CBC, TSH today - Will reassess at follow-up appt   Adin Hector, MD PGY-1 [facility]

## 2015-06-11 NOTE — Assessment & Plan Note (Signed)
Stable. Patient is going to the medical supply store today to pick up replacement hose for CPAP.

## 2015-06-11 NOTE — Assessment & Plan Note (Signed)
Stable.  - Repeat A1C today - Patient to schedule appt with [facility]  - Continue Lantus 55U qd, Novolog 20U with meals, Victoza 1.8mg  qd

## 2015-06-11 NOTE — Assessment & Plan Note (Signed)
New problem. Given location and description of pain, concern for cholelithiasis/cholecystitis.  - CMP today to look for abnormal LFTs - Will consider RUQ [place] at follow-up appointment if pain has continued/labs are abnormal

## 2015-06-11 NOTE — Assessment & Plan Note (Signed)
Patient currently on aspirin and Plavix. Given increased risk of bleeding with two anti-platelets but no significant benefit of dual therapy, will discontinue aspirin.  - Continue Plavix

## 2015-06-11 NOTE — Assessment & Plan Note (Signed)
New problem. Differential includes hypokalemia, anemia, or thyroid abnormalities.  - CMP, CBC, TSH today - Will reassess at follow-up appt

## 2015-06-11 NOTE — Patient Instructions (Signed)
It was nice seeing you again today, Ms. Roark!   To prevent another stroke, I would like you to start taking Xarelto and Plavix again. Please STOP taking aspirin. Like we discussed, there is a risk of increased bleeding with these medications, but the benefit of preventing another stroke is greater than the risk of increased bleeding.   Please also call your eye doctor to schedule an appointment.   I will see you in two weeks and we will discuss your lab results at that time. If you need anything in the meantime, please feel free to call the clinic.   Be well,  Dr. Avon Gully

## 2015-06-11 NOTE — Assessment & Plan Note (Addendum)
Improving. BP 130/82 today.  - Cont chlorthalidone (in the AM), amlodipine, carvedilol, lisinopril, and spironolactone

## 2015-06-12 ENCOUNTER — Encounter: Payer: Self-pay | Admitting: Internal Medicine

## 2015-06-13 NOTE — Telephone Encounter (Signed)
Contacted patient to f/u with message below. Contact was successful. I informed pt of message below and she was very appreciative of this new information regarding Pepsi Max. She reports that she was unaware and expressed understanding of the importance of declining her intake of this beverage. I ask pt if she was interested in following up with Brad anytime in the future. Pt reports that she does intend on pursuing a follow up with [facility] but not at this time as she feels overwhelmed with appts. I would like to suggest a follow up phone call with patient from [facility] to perhaps meet the scheduling needs of pt. Additionally, I assured pt that when and if she does decide to follow up that we are here to support her in any way that we can and we are only a "phone call away". Pt expressed appreciation. Sadie Reynolds, ASA

## 2015-06-26 ENCOUNTER — Ambulatory Visit: Payer: Medicare Other | Admitting: Internal Medicine

## 2015-08-05 DIAGNOSIS — G459 Transient cerebral ischemic attack, unspecified: Secondary | ICD-10-CM

## 2015-08-05 HISTORY — DX: Transient cerebral ischemic attack, unspecified: G45.9

## 2015-09-12 ENCOUNTER — Emergency Department (HOSPITAL_COMMUNITY): Payer: No Typology Code available for payment source

## 2015-09-12 ENCOUNTER — Emergency Department (HOSPITAL_COMMUNITY)
Admission: EM | Admit: 2015-09-12 | Discharge: 2015-09-13 | Disposition: A | Discharge status: Home / Self Care | Payer: No Typology Code available for payment source | Attending: Emergency Medicine | Admitting: Emergency Medicine

## 2015-09-12 ENCOUNTER — Encounter (HOSPITAL_COMMUNITY): Payer: Self-pay | Admitting: *Deleted

## 2015-09-12 DIAGNOSIS — Z8673 Personal history of transient ischemic attack (TIA), and cerebral infarction without residual deficits: Secondary | ICD-10-CM | POA: Diagnosis not present

## 2015-09-12 DIAGNOSIS — Z794 Long term (current) use of insulin: Secondary | ICD-10-CM | POA: Insufficient documentation

## 2015-09-12 DIAGNOSIS — I10 Essential (primary) hypertension: Secondary | ICD-10-CM | POA: Diagnosis not present

## 2015-09-12 DIAGNOSIS — E119 Type 2 diabetes mellitus without complications: Secondary | ICD-10-CM | POA: Diagnosis not present

## 2015-09-12 DIAGNOSIS — S3992XA Unspecified injury of lower back, initial encounter: Secondary | ICD-10-CM | POA: Insufficient documentation

## 2015-09-12 DIAGNOSIS — G8929 Other chronic pain: Secondary | ICD-10-CM | POA: Diagnosis not present

## 2015-09-12 DIAGNOSIS — Y998 Other external cause status: Secondary | ICD-10-CM | POA: Insufficient documentation

## 2015-09-12 DIAGNOSIS — S29001A Unspecified injury of muscle and tendon of front wall of thorax, initial encounter: Secondary | ICD-10-CM | POA: Diagnosis not present

## 2015-09-12 DIAGNOSIS — Z7902 Long term (current) use of antithrombotics/antiplatelets: Secondary | ICD-10-CM | POA: Insufficient documentation

## 2015-09-12 DIAGNOSIS — Y92410 Unspecified street and highway as the place of occurrence of the external cause: Secondary | ICD-10-CM | POA: Insufficient documentation

## 2015-09-12 DIAGNOSIS — Y9389 Activity, other specified: Secondary | ICD-10-CM | POA: Diagnosis not present

## 2015-09-12 DIAGNOSIS — Z79899 Other long term (current) drug therapy: Secondary | ICD-10-CM | POA: Diagnosis not present

## 2015-09-12 DIAGNOSIS — R079 Chest pain, unspecified: Secondary | ICD-10-CM | POA: Diagnosis not present

## 2015-09-12 DIAGNOSIS — G44319 Acute post-traumatic headache, not intractable: Secondary | ICD-10-CM

## 2015-09-12 DIAGNOSIS — S0990XA Unspecified injury of head, initial encounter: Secondary | ICD-10-CM | POA: Diagnosis present

## 2015-09-12 DIAGNOSIS — R51 Headache: Secondary | ICD-10-CM | POA: Diagnosis not present

## 2015-09-12 LAB — COMPREHENSIVE METABOLIC PANEL
ALBUMIN: 3.6 g/dL (ref 3.5–5.0)
ALT: 12 U/L — ABNORMAL LOW (ref 14–54)
AST: 22 U/L (ref 15–41)
Alkaline Phosphatase: 84 U/L (ref 38–126)
Anion gap: 11 (ref 5–15)
BILIRUBIN TOTAL: 0.6 mg/dL (ref 0.3–1.2)
BUN: 10 mg/dL (ref 6–20)
CALCIUM: 9.5 mg/dL (ref 8.9–10.3)
CO2: 25 mmol/L (ref 22–32)
Chloride: 107 mmol/L (ref 101–111)
Creatinine, Ser: 0.91 mg/dL (ref 0.44–1.00)
GLUCOSE: 154 mg/dL — AB (ref 65–99)
POTASSIUM: 3.9 mmol/L (ref 3.5–5.1)
Sodium: 143 mmol/L (ref 135–145)
Total Protein: 8.1 g/dL (ref 6.5–8.1)

## 2015-09-12 LAB — I-STAT TROPONIN, ED
TROPONIN I, POC: 0 ng/mL (ref 0.00–0.08)
TROPONIN I, POC: 0.01 ng/mL (ref 0.00–0.08)

## 2015-09-12 LAB — CBC WITH DIFFERENTIAL/PLATELET
BASOS ABS: 0 10*3/uL (ref 0.0–0.1)
Basophils Relative: 0 %
Eosinophils Absolute: 0.1 10*3/uL (ref 0.0–0.7)
Eosinophils Relative: 1 %
HEMATOCRIT: 40.1 % (ref 36.0–46.0)
Hemoglobin: 13.1 g/dL (ref 12.0–15.0)
Lymphocytes Relative: 43 %
Lymphs Abs: 3.4 10*3/uL (ref 0.7–4.0)
MCH: 23.2 pg — AB (ref 26.0–34.0)
MCHC: 32.7 g/dL (ref 30.0–36.0)
MCV: 71 fL — AB (ref 78.0–100.0)
MONO ABS: 0.5 10*3/uL (ref 0.1–1.0)
Monocytes Relative: 6 %
Neutro Abs: 4.1 10*3/uL (ref 1.7–7.7)
Neutrophils Relative %: 51 %
Platelets: 296 10*3/uL (ref 150–400)
RBC: 5.65 MIL/uL — AB (ref 3.87–5.11)
RDW: 14.9 % (ref 11.5–15.5)
WBC: 8.1 10*3/uL (ref 4.0–10.5)

## 2015-09-12 MED ORDER — PROCHLORPERAZINE MALEATE 10 MG PO TABS
10.0000 mg | ORAL_TABLET | Freq: Once | ORAL | Status: AC
  Administered 2015-09-12: 10 mg via ORAL
  Filled 2015-09-12: qty 1

## 2015-09-12 MED ORDER — DIPHENHYDRAMINE HCL 25 MG PO CAPS
25.0000 mg | ORAL_CAPSULE | Freq: Once | ORAL | Status: AC
  Administered 2015-09-12: 25 mg via ORAL
  Filled 2015-09-12: qty 1

## 2015-09-12 MED ORDER — PROCHLORPERAZINE EDISYLATE 5 MG/ML IJ SOLN
10.0000 mg | Freq: Four times a day (QID) | INTRAMUSCULAR | Status: DC | PRN
  Filled 2015-09-12: qty 2

## 2015-09-12 MED ORDER — DIPHENHYDRAMINE HCL 50 MG/ML IJ SOLN
25.0000 mg | Freq: Once | INTRAMUSCULAR | Status: DC
  Filled 2015-09-12: qty 1

## 2015-09-12 MED ORDER — CARVEDILOL 12.5 MG PO TABS
25.0000 mg | ORAL_TABLET | Freq: Two times a day (BID) | ORAL | Status: DC
  Administered 2015-09-12: 25 mg via ORAL
  Filled 2015-09-12: qty 2

## 2015-09-12 MED ORDER — AMLODIPINE BESYLATE 5 MG PO TABS
10.0000 mg | ORAL_TABLET | Freq: Once | ORAL | Status: AC
  Administered 2015-09-12: 10 mg via ORAL
  Filled 2015-09-12: qty 2

## 2015-09-12 MED ORDER — LISINOPRIL 20 MG PO TABS
40.0000 mg | ORAL_TABLET | Freq: Once | ORAL | Status: AC
  Administered 2015-09-12: 40 mg via ORAL
  Filled 2015-09-12: qty 2

## 2015-09-12 NOTE — ED Notes (Signed)
No chest pain nay longer  C/o a headache
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
## 2015-09-12 NOTE — ED Notes (Signed)
Pt to ct 

## 2015-09-12 NOTE — ED Notes (Signed)
Pt states she has not taken her BP meds in at least 2 days. Has refills available, but no money to get them filled.

## 2015-09-12 NOTE — ED Notes (Signed)
Med given for bp and her headache.  She reports that she cannot afford her bp medication at times

## 2015-09-13 DIAGNOSIS — S0990XA Unspecified injury of head, initial encounter: Secondary | ICD-10-CM | POA: Diagnosis not present

## 2015-09-13 NOTE — ED Notes (Signed)
The pt reports that she thought she was ready to go home so she took all her  Wires off then was told by the doctor that she had to wait for more lab results  Hooked back up

## 2015-09-13 NOTE — Discharge Instructions (Signed)

## 2015-09-13 NOTE — ED Notes (Signed)
Pt wants to go home no discharge papers yet  The pt reports that her headache is so much better maybe a 2 pain

## 2015-10-03 ENCOUNTER — Ambulatory Visit: Payer: Medicare Other | Admitting: Internal Medicine

## 2015-10-12 ENCOUNTER — Ambulatory Visit (INDEPENDENT_AMBULATORY_CARE_PROVIDER_SITE_OTHER): Payer: Medicare Other | Admitting: Internal Medicine

## 2015-10-12 ENCOUNTER — Encounter: Payer: Self-pay | Admitting: Internal Medicine

## 2015-10-12 VITALS — BP 184/90 | HR 71 | Temp 97.8°F | Wt <= 1120 oz

## 2015-10-12 DIAGNOSIS — M546 Pain in thoracic spine: Secondary | ICD-10-CM

## 2015-10-12 DIAGNOSIS — I480 Paroxysmal atrial fibrillation: Secondary | ICD-10-CM

## 2015-10-12 DIAGNOSIS — M549 Dorsalgia, unspecified: Secondary | ICD-10-CM

## 2015-10-12 MED ORDER — IBUPROFEN 600 MG PO TABS: 600.0000 mg | ORAL_TABLET | Freq: Three times a day (TID) | ORAL | Status: DC | PRN

## 2015-10-12 MED ORDER — CYCLOBENZAPRINE HCL 5 MG PO TABS: 5.0000 mg | ORAL_TABLET | Freq: Three times a day (TID) | ORAL | Status: DC | PRN

## 2015-10-12 MED ORDER — RIVAROXABAN 20 MG PO TABS: 20.0000 mg | ORAL_TABLET | Freq: Every day | ORAL | Status: DC
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
## 2015-10-12 NOTE — Patient Instructions (Signed)
It was nice seeing you again today Ms. Rollo!  For your back pain, you can take ibuprofen 600 mg every 8 hours as needed. You can also take Flexeril up to three times a day, but only as needed. Please also complete the back stretches and exercises listed below every day.   It is very important to start taking Xarelto again. I have called in a refill so you will have plenty of medication. If for some reason you would like to switch to coumadin, please call the office and I will call that in to your pharmacy in place of Xarelto.   If you have any questions or concerns, feel free to call the office at any time.   Be well,  Dr. Fay Records and Exercises for Upper Back Pain   Pectoralis stretch: Stand in an open doorway or corner with both hands slightly above your head on the door frame or wall. Slowly lean forward until you feel a stretch in the front of your shoulders. Hold 15 to 30 seconds. Repeat 3 times.   Thoracic extension: Sit in a chair and clasp both arms behind your head. Gently arch backward and look up toward the ceiling. Repeat 10 times. Do this several times each day.   Arm slide on wall: Sit or stand with your back against a wall and your elbows and wrists against the wall. Slowly slide your arms upward as high as you can while keeping your elbows and wrists against the wall. Do 2 sets of 8 to 12.   Scapular squeeze: While sitting or standing with your arms by your sides, squeeze your shoulder blades together and hold for 5 seconds. Do 2 sets of 15.   Mid-trap exercise: Lie on your stomach on a firm surface and place a folded pillow underneath your chest. Place your arms out straight to your sides with your elbows straight and thumbs toward the ceiling. Slowly raise your arms toward the ceiling as you squeeze your shoulder blades together. Lower slowly. Do 3 sets of 15. As the exercise gets easier to do, hold soup cans or small weights in your hands.   Thoracic stretch:  Sit on the floor with your legs out straight in front of you. Hold your mid-thighs with your hands. Curl you head and neck toward your belly button. Hold for a count of 15. Repeat 3 times.   Quadruped arm and leg raise: Get down on your hands and knees. Pull in your belly button and tighten your abdominal muscles to stiffen your spine. While keeping your abdominals tight, raise one arm a nd the opposite leg away from you. Hold this position for 5 seconds. Lower your arm and leg slowly and change sides. Do this 10 times on each side.   Rowing exercise: Close middle of elastic tubing in a door or wrap tubing around an immovable object. Hold 1 end in each hand. Sit in a chair, bend your arms 90 degrees, and hold one end of the tubing in each hand. Keep your forearms vertical and your elbows at shoulder level and bent 90 degrees. Pull backward on the band and squeeze your shoulder blades together. Do 2 sets of 15.

## 2015-10-12 NOTE — Assessment & Plan Note (Signed)
Has not been taking Xarelto for two months. Discussed importance of anticoagulation with a.fib, and that benefits of Xarelto outweigh the risks in this patient. Discussed possibility of resuming coumadin, however patient reports that she does not want to have to come in for regular INR checks and would prefer to remain on Xarelto. Explained that aspirin alone is not sufficient treatment with a.fib. Patient agreeable to resuming Xarelto.  - Refill Xarelto today  - Encouraged patient to call office if she decides that she would like to transition to coumadin. Will write prescription for coumadin at that time if patient so desires.

## 2015-12-07 NOTE — Progress Notes (Signed)
Patient ID: [name], female   DOB: [birth year], 60 y.o.   MRN: [id number]     Cardiology Office Note   Date:  12/10/2015   ID:  Carla, [name] 11-26-1955, MRN [id number]  PCP:  Adin Hector, MD  Cardiologist:   Jenkins Rouge, MD   Chief Complaint  Patient presents with  . Hypertension    fatigue & SOB, per pt      History of Present Illness: [name] is a 60 y.o. female who presents for atypical chest pain.  She has significant HTN and DM both poorly controlled due to body habitus and compliance issues  A1c in March was 9.6 She has atypical chest pain.  Sharp , right and left sided weekly.  Not always exertional.  Also indicates history of PAF although I cannot document with Epic records And ECGls in Epic show NSR.  Sedentary walks with cane.  Compliant with meds   Reviewed echo from July 2015    Study Conclusions  - Left ventricle: The cavity size was normal. Systolic function was normal. The estimated ejection fraction was in the range of 55% to 60%. Wall motion was normal; there were no regional wall motion abnormalities. - Atrial septum: No defect or patent foramen ovale was identified.  Seen by Dr Doylene Canard 07/2014  For evaluation after stroke.  MRI with lacunar infarcts and possible amyloid angiopathy  11/28/14:  Myovue  Overall Impression:  Low risk stress nuclear study with Medium in size, mild in intensity defects  in the mid and basal inferolateral wall that appear reversible.  There is a small in size, small in intensity fixed defect in the apical anterior and apical walls.  These defects most likely represent shifting breast attenuation artifact.  The RAW images demonstrate large pendulous breasts overlying the cardiac silhouette. Cannot rule out a small area of ischemia in the inferolateral wall.   LV Ejection Fraction: 59%.  LV Wall Motion:  NL LV Function; NL Wall Motion  History of afib. Was on coumadin. Then changed to xarelto but  stopped for 2 months Children told her to after seeing TV commercials.   Primary resumed it on 10/12/15  Has URI no fever Has cough but no sputum BP up this am but did not take meds this am due to early appt  Past Medical History  Diagnosis Date  . Hypertension   . Hypertension     Hypertensive urgency 05/2006  . Atrial fibrillation ([place])   . Hypercholesteremia   . 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 Diabetes mellitus     type 2    Past Surgical History  Procedure Laterality Date  . Tonsilectomy, adenoidectomy, bilateral myringotomy and tubes  age 9  . Dilation and curettage of uterus  1976  . Meniscus repair  03/09    right knee  . Tonsillectomy       Current Outpatient Prescriptions  Medication Sig Dispense Refill  . amLODipine (NORVASC) 10 MG tablet Take 1 tablet (10 mg total) by mouth daily. 30 tablet 3  . carvedilol (COREG) 25 MG tablet Take 1 tablet (25 mg total) by mouth 2 (two) times daily with a meal. 60 tablet 3  . clopidogrel (PLAVIX) 75 MG tablet Take 1 tablet (75 mg total) by mouth daily. 30 tablet 1  . cyclobenzaprine (FLEXERIL) 5 MG tablet Take 1 tablet (5 mg total) by mouth 3 (three) times daily as needed  for muscle spasms. 15 tablet 0  . ibuprofen (ADVIL,MOTRIN) 600 MG tablet take 1 tablet by mouth four times a day if needed for pain  0  . ibuprofen (ADVIL,MOTRIN) 600 MG tablet Take 1 tablet (600 mg total) by mouth every 8 (eight) hours as needed. 30 tablet 0  . insulin aspart (NOVOLOG) 100 UNIT/ML injection Inject 20 Units into the skin 3 (three) times daily with meals. 10 mL 2  . Insulin Glargine (LANTUS) 100 UNIT/ML Solostar Pen Inject 55 Units into the skin daily. 15 mL 1  . Liraglutide (VICTOZA) 18 MG/3ML SOPN Inject 0.3 mLs (1.8 mg total) into the skin daily. 6 mL 2  . lisinopril (PRINIVIL,ZESTRIL) 40 MG tablet TAKE 1 TABLET(40 MG) BY MOUTH DAILY 30 tablet 2  . nystatin (MYCOSTATIN) powder Apply topically 3 (three)  times daily. Apply up to 3 times daily as needed. 30 g 2  . omeprazole (PRILOSEC) 20 MG capsule Take 1 capsule (20 mg total) by mouth 2 (two) times daily before a meal. 60 capsule 2  . rosuvastatin (CRESTOR) 10 MG tablet Take 1 tablet (10 mg total) by mouth daily. 30 tablet 1  . spironolactone (ALDACTONE) 25 MG tablet Take 1 tablet (25 mg total) by mouth daily. 30 tablet 2   No current facility-administered medications for this visit.    Allergies:   Propoxyphene n-acetaminophen    Social History:  The patient  reports that she has never smoked. She has never used smokeless tobacco. She reports that she does not drink alcohol or use illicit drugs.   Family History:  The patient's family history includes Diabetes in her father; Heart failure in her mother; Hypertension in her father; Lung cancer in her sister.    ROS:  Please see the history of present illness.   Otherwise, review of systems are positive for none.   All other systems are reviewed and negative.    PHYSICAL EXAM: VS:  BP 180/100 mmHg  Pulse 79  Ht 5\' 2"  (1.575 m)  Wt 120.112 kg (264 lb 12.8 oz)  BMI 48.42 kg/m2  SpO2 96% , BMI Body mass index is 48.42 kg/(m^2). Obese black female  HEENT: normal Neck: no JVD, carotid bruits, or masses Cardiac:  RRR; no murmurs, rubs, or gallops,no edema  Respiratory:  clear to auscultation bilaterally, normal work of breathing GI: soft, nontender, nondistended, + BS MS: no deformity or atrophy Skin: warm and dry, no rash Neuro:  Strength and sensation are intact Psych: euthymic mood, full affect   EKG:   SR rate 87 low voltage 01/28/14  09/14/15  SR rate 77 normal ECG    Recent Labs: 01/17/2015: B Natriuretic Peptide 23.9 06/11/2015: TSH 2.441 09/12/2015: ALT 12*; BUN 10; Creatinine, Ser 0.91; Hemoglobin 13.1; Platelets 296; Potassium 3.9; Sodium 143    Lipid Panel    Component Value Date/Time   CHOL 124 07/08/2012 0550   TRIG 105 07/08/2012 0550   HDL 38* 07/08/2012 0550    CHOLHDL 3.3 07/08/2012 0550   VLDL 21 07/08/2012 0550   LDLCALC 65 07/08/2012 0550   LDLDIRECT 71 07/12/2013 1610      Wt Readings from Last 3 Encounters:  12/10/15 120.112 kg (264 lb 12.8 oz)  10/12/15 30.845 kg (68 lb)  06/11/15 116.574 kg (257 lb)      Other studies Reviewed: Additional studies/ records that were reviewed today include: Dr Glennon Mac records and Epic notes see HPI.    ASSESSMENT AND PLAN:  1.  HTN:  Well controlled when she takes her meds Discussed low salt diet weight loss and exercise importance  2. Chest Pain  low risk myovue  3. DM:  Discussed low carb diet.  Target hemoglobin A1c is 6.5 or less.  Continue current medications. 4/ Chol:  At goal 65 continue statin 5. CVA:  Likely more lacunar and amyloid angiopathy.  Continue asa and plavix  6. URI  F/u primary CXR likely needed with robitussin    Current medicines are reviewed at length with the patient today.  The patient does not have concerns regarding medicines.  The following changes have been made:  no change  Labs/ tests ordered today include:   No orders of the defined types were placed in this encounter.     Disposition:   FU with  Me in 6 months     Signed, Jenkins Rouge, MD  12/10/2015 8:57 AM    [facility] [address], [place], [place]  [address] Phone: [phone]; Fax: [phone]

## 2015-12-10 ENCOUNTER — Ambulatory Visit (INDEPENDENT_AMBULATORY_CARE_PROVIDER_SITE_OTHER): Payer: Medicare Other | Admitting: Cardiovascular Disease

## 2015-12-10 ENCOUNTER — Encounter: Payer: Self-pay | Admitting: Cardiovascular Disease

## 2015-12-10 ENCOUNTER — Emergency Department (HOSPITAL_COMMUNITY): Payer: Medicare Other

## 2015-12-10 ENCOUNTER — Encounter (HOSPITAL_COMMUNITY): Payer: Self-pay | Admitting: *Deleted

## 2015-12-10 ENCOUNTER — Emergency Department (HOSPITAL_COMMUNITY)
Admission: EM | Admit: 2015-12-10 | Discharge: 2015-12-10 | Disposition: A | Discharge status: Home / Self Care | Payer: Medicare Other | Attending: Emergency Medicine | Admitting: Emergency Medicine

## 2015-12-10 VITALS — BP 150/90 | HR 79 | Ht 62.0 in | Wt 264.8 lb

## 2015-12-10 DIAGNOSIS — Z79899 Other long term (current) drug therapy: Secondary | ICD-10-CM | POA: Insufficient documentation

## 2015-12-10 DIAGNOSIS — E119 Type 2 diabetes mellitus without complications: Secondary | ICD-10-CM | POA: Diagnosis not present

## 2015-12-10 DIAGNOSIS — R0602 Shortness of breath: Secondary | ICD-10-CM | POA: Diagnosis present

## 2015-12-10 DIAGNOSIS — Z8673 Personal history of transient ischemic attack (TIA), and cerebral infarction without residual deficits: Secondary | ICD-10-CM | POA: Diagnosis not present

## 2015-12-10 DIAGNOSIS — Z7902 Long term (current) use of antithrombotics/antiplatelets: Secondary | ICD-10-CM | POA: Insufficient documentation

## 2015-12-10 DIAGNOSIS — R05 Cough: Secondary | ICD-10-CM | POA: Diagnosis not present

## 2015-12-10 DIAGNOSIS — Z794 Long term (current) use of insulin: Secondary | ICD-10-CM | POA: Diagnosis not present

## 2015-12-10 DIAGNOSIS — E7800 Pure hypercholesterolemia, unspecified: Secondary | ICD-10-CM | POA: Insufficient documentation

## 2015-12-10 DIAGNOSIS — I10 Essential (primary) hypertension: Secondary | ICD-10-CM | POA: Insufficient documentation

## 2015-12-10 DIAGNOSIS — J40 Bronchitis, not specified as acute or chronic: Secondary | ICD-10-CM | POA: Diagnosis not present

## 2015-12-10 LAB — BASIC METABOLIC PANEL
Anion gap: 11 (ref 5–15)
BUN: 13 mg/dL (ref 6–20)
CHLORIDE: 108 mmol/L (ref 101–111)
CO2: 22 mmol/L (ref 22–32)
CREATININE: 0.96 mg/dL (ref 0.44–1.00)
Calcium: 9.2 mg/dL (ref 8.9–10.3)
GFR calc non Af Amer: >60 mL/min (ref >60)
Glucose, Bld: 181 mg/dL — ABNORMAL HIGH (ref 65–99)
POTASSIUM: 4 mmol/L (ref 3.5–5.1)
SODIUM: 141 mmol/L (ref 135–145)

## 2015-12-10 LAB — I-STAT TROPONIN, ED: Troponin i, poc: 0 ng/mL (ref 0.00–0.08)

## 2015-12-10 LAB — CBC
HEMATOCRIT: 38 % (ref 36.0–46.0)
HEMOGLOBIN: 12 g/dL (ref 12.0–15.0)
MCH: 22.3 pg — ABNORMAL LOW (ref 26.0–34.0)
MCHC: 31.6 g/dL (ref 30.0–36.0)
MCV: 70.8 fL — ABNORMAL LOW (ref 78.0–100.0)
PLATELETS: 301 10*3/uL (ref 150–400)
RBC: 5.37 MIL/uL — AB (ref 3.87–5.11)
RDW: 15.3 % (ref 11.5–15.5)
WBC: 7.9 10*3/uL (ref 4.0–10.5)

## 2015-12-10 MED ORDER — IPRATROPIUM-ALBUTEROL 0.5-2.5 (3) MG/3ML IN SOLN
3.0000 mL | Freq: Once | RESPIRATORY_TRACT | Status: AC
  Administered 2015-12-10: 3 mL via RESPIRATORY_TRACT
  Filled 2015-12-10: qty 3

## 2015-12-10 MED ORDER — ALBUTEROL SULFATE HFA 108 (90 BASE) MCG/ACT IN AERS: 2.0000 | INHALATION_SPRAY | Freq: Four times a day (QID) | RESPIRATORY_TRACT | Status: DC | PRN

## 2015-12-10 MED ORDER — HYDROCODONE-ACETAMINOPHEN 5-325 MG PO TABS: 1.0000 | ORAL_TABLET | Freq: Four times a day (QID) | ORAL | Status: DC | PRN

## 2015-12-10 MED ORDER — DM-GUAIFENESIN ER 30-600 MG PO TB12: 1.0000 | ORAL_TABLET | Freq: Two times a day (BID) | ORAL | Status: DC

## 2015-12-10 MED ORDER — ACETAMINOPHEN 325 MG PO TABS
650.0000 mg | ORAL_TABLET | Freq: Once | ORAL | Status: AC
  Administered 2015-12-10: 650 mg via ORAL
  Filled 2015-12-10: qty 2

## 2015-12-10 NOTE — Patient Instructions (Signed)

## 2015-12-10 NOTE — Discharge Instructions (Signed)
Use albuterol inhaler 2 puffs every 6 hours for the next 7 days. Use the Mucinex DM twice a day. Today's chest x-ray was negative. Take hydrocodone as needed for bodyaches and pain. Make an appointment to follow-up with your regular doctor. Return for any new or worse symptoms.

## 2015-12-10 NOTE — ED Notes (Signed)
PT is here with shortness of breath, cough with green sputum, pain in chest with coughing, and reports fever.  Symptoms since Thursday evening

## 2015-12-10 NOTE — ED Provider Notes (Signed)
CSN: [id number]     Arrival date & time 12/10/15  [id number] History   First MD Initiated Contact with Patient 12/10/15 1122     Chief Complaint  Patient presents with  . Shortness of Breath  . Cough     (Consider location/radiation/quality/duration/timing/severity/associated sxs/prior Treatment) Patient is a 60 y.o. female presenting with shortness of breath and cough. The history is provided by the patient.  Shortness of Breath Associated symptoms: chest pain, cough and fever   Associated symptoms: no abdominal pain, no headaches and no rash   Cough Associated symptoms: chest pain, fever, myalgias and shortness of breath   Associated symptoms: no headaches and no rash    Patient with a complaint of congestion shortness of breath cough with green sputum intermittent fever since Thursday evening. Patient's concerned about having pneumonia. Patient with some chest discomfort mostly with coughing no persistent constant chest pain. Denies any nausea vomiting or diarrhea.  Past Medical History  Diagnosis Date  . Hypertension   . Hypertension     Hypertensive urgency 05/2006  . Atrial fibrillation ([place])   . Hypercholesteremia   . 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 Diabetes mellitus     type 2   Past Surgical History  Procedure Laterality Date  . Tonsilectomy, adenoidectomy, bilateral myringotomy and tubes  age 28  . Dilation and curettage of uterus  1976  . Meniscus repair  03/09    right knee  . Tonsillectomy     Family History  Problem Relation Age of Onset  . Heart failure Mother   . Diabetes Father   . Hypertension Father   . Lung cancer Sister    Social History  Substance Use Topics  . Smoking status: Never Smoker   . Smokeless tobacco: Never Used  . Alcohol Use: No   OB History    No data available     Review of Systems  Constitutional: Positive for fever.  HENT: Positive for congestion.   Eyes: Negative for redness.   Respiratory: Positive for cough and shortness of breath.   Cardiovascular: Positive for chest pain.  Gastrointestinal: Negative for nausea, abdominal pain and diarrhea.  Genitourinary: Negative for dysuria.  Musculoskeletal: Positive for myalgias.  Skin: Negative for rash.  Neurological: Negative for headaches.  Hematological: Does not bruise/bleed easily.  Psychiatric/Behavioral: Negative for confusion.      Allergies  Propoxyphene n-acetaminophen  Home Medications   Prior to Admission medications   Medication Sig Start Date End Date Taking? Authorizing Provider  amLODipine (NORVASC) 10 MG tablet Take 1 tablet (10 mg total) by mouth daily. 05/10/15  Yes Verner Mould, MD  carvedilol (COREG) 25 MG tablet Take 1 tablet (25 mg total) by mouth 2 (two) times daily with a meal. 05/10/15  Yes Verner Mould, MD  clopidogrel (PLAVIX) 75 MG tablet Take 1 tablet (75 mg total) by mouth daily. 05/10/15  Yes Verner Mould, MD  cyclobenzaprine (FLEXERIL) 5 MG tablet Take 1 tablet (5 mg total) by mouth 3 (three) times daily as needed for muscle spasms. 10/12/15  Yes Verner Mould, MD  ibuprofen (ADVIL,MOTRIN) 600 MG tablet Take 1 tablet (600 mg total) by mouth every 8 (eight) hours as needed. 10/12/15  Yes Verner Mould, MD  insulin aspart (NOVOLOG) 100 UNIT/ML injection Inject 20 Units into the skin 3 (three) times daily with meals. 06/11/15  Yes Verner Mould, MD  Insulin Glargine (LANTUS) 100 UNIT/ML  Solostar Pen Inject 55 Units into the skin daily. 06/11/15  Yes Verner Mould, MD  Liraglutide (VICTOZA) 18 MG/3ML SOPN Inject 0.3 mLs (1.8 mg total) into the skin daily. 06/11/15  Yes Verner Mould, MD  lisinopril (PRINIVIL,ZESTRIL) 40 MG tablet TAKE 1 TABLET(40 MG) BY MOUTH DAILY 06/11/15  Yes Verner Mould, MD  nystatin (MYCOSTATIN) powder Apply topically 3 (three) times daily. Apply up to 3 times daily as  needed. Patient taking differently: Apply 1 g topically 3 (three) times daily. Apply up to 3 times daily as needed. 05/10/15  Yes Verner Mould, MD  omeprazole (PRILOSEC) 20 MG capsule Take 1 capsule (20 mg total) by mouth 2 (two) times daily before a meal. 06/11/15  Yes Verner Mould, MD  rosuvastatin (CRESTOR) 10 MG tablet Take 1 tablet (10 mg total) by mouth daily. 05/10/15  Yes Verner Mould, MD  spironolactone (ALDACTONE) 25 MG tablet Take 1 tablet (25 mg total) by mouth daily. 06/11/15  Yes Verner Mould, MD  albuterol (PROVENTIL HFA;VENTOLIN HFA) 108 (90 Base) MCG/ACT inhaler Inhale 2 puffs into the lungs every 6 (six) hours as needed for wheezing or shortness of breath. 12/10/15   Fredia Sorrow, MD  dextromethorphan-guaiFENesin (MUCINEX DM) 30-600 MG 12hr tablet Take 1 tablet by mouth 2 (two) times daily. 12/10/15   Fredia Sorrow, MD  HYDROcodone-acetaminophen (NORCO/VICODIN) 5-325 MG tablet Take 1-2 tablets by mouth every 6 (six) hours as needed for moderate pain. 12/10/15   Fredia Sorrow, MD   BP 191/84 mmHg  Pulse 83  Temp(Src) 97.9 F (36.6 C) (Oral)  Resp 17  SpO2 95% Physical Exam  Constitutional: She is oriented to person, place, and time. She appears well-developed and well-nourished. No distress.  HENT:  Head: Normocephalic and atraumatic.  Mouth/Throat: Oropharynx is clear and moist.  Eyes: Conjunctivae and EOM are normal. Pupils are equal, round, and reactive to light.  Neck: Normal range of motion. Neck supple.  Cardiovascular: Normal rate, regular rhythm and normal heart sounds.   No murmur heard. Pulmonary/Chest: Effort normal and breath sounds normal. No respiratory distress. She has no wheezes. She has no rales.  Abdominal: Soft. Bowel sounds are normal. There is no tenderness.  Musculoskeletal: Normal range of motion.  Neurological: She is alert and oriented to person, place, and time. No cranial nerve deficit. Coordination  normal.  Skin: Skin is warm.  Nursing note and vitals reviewed.   ED Course  Procedures (including critical care time) Labs Review Labs Reviewed  BASIC METABOLIC PANEL - Abnormal; Notable for the following:    Glucose, Bld 181 (*)    All other components within normal limits  CBC - Abnormal; Notable for the following:    RBC 5.37 (*)    MCV 70.8 (*)    MCH 22.3 (*)    All other components within normal limits  I-STAT TROPOININ, ED   Results for orders placed or performed during the hospital encounter of [id number]  Basic metabolic panel  Result Value Ref Range   Sodium 141 135 - 145 mmol/L   Potassium 4.0 3.5 - 5.1 mmol/L   Chloride 108 101 - 111 mmol/L   CO2 22 22 - 32 mmol/L   Glucose, Bld 181 (H) 65 - 99 mg/dL   BUN 13 6 - 20 mg/dL   Creatinine, Ser 0.96 0.44 - 1.00 mg/dL   Calcium 9.2 8.9 - 10.3 mg/dL   GFR calc non Af Amer >60 >60 mL/min   GFR calc Af  Amer >60 >60 mL/min   Anion gap 11 5 - 15  CBC  Result Value Ref Range   WBC 7.9 4.0 - 10.5 K/uL   RBC 5.37 (H) 3.87 - 5.11 MIL/uL   Hemoglobin 12.0 12.0 - 15.0 g/dL   HCT 38.0 36.0 - 46.0 %   MCV 70.8 (L) 78.0 - 100.0 fL   MCH 22.3 (L) 26.0 - 34.0 pg   MCHC 31.6 30.0 - 36.0 g/dL   RDW 15.3 11.5 - 15.5 %   Platelets 301 150 - 400 K/uL  I-stat troponin, ED  Result Value Ref Range   Troponin i, poc 0.00 0.00 - 0.08 ng/mL   Comment 3            Imaging Review Dg Chest 2 View  12/10/2015  CLINICAL DATA:  Chest pain and cough for 4 days EXAM: CHEST  2 VIEW COMPARISON:  September 12, 2015 FINDINGS: There is no edema or consolidation. The heart size and pulmonary vascularity are normal. No adenopathy. There is degenerative change in the mid thoracic spine. IMPRESSION: No edema or consolidation. Electronically Signed   By: Lowella Grip III [name]   On: 12/10/2015 11:14   I have personally reviewed and evaluated these images and lab results as part of my medical decision-making.   EKG Interpretation   Date/Time:   Monday Dec 10 2015 10:23:12 EDT Ventricular Rate:  76 PR Interval:  162 QRS Duration: 80 QT Interval:  370 QTC Calculation: 416 R Axis:   -2 Text Interpretation:  Normal sinus rhythm Minimal voltage criteria for  LVH, may be normal variant Cannot rule out Anterior infarct , age  undetermined Abnormal ECG Confirmed by Jolyne Laye  MD, [place] [id number]) on  12/10/2015 11:27:23 AM      MDM   Final diagnoses:  Bronchitis    Patient with symptoms consistent of upper respiratory infection bronchitis. Chest x-ray negative for pneumonia. No hypoxia. Patient improved with albuterol Atrovent nebulizer. Patient will be continued on albuterol inhaler and Mucinex DM. Patient will return for any new or worse symptoms.  Patient without chest pain consistent with an acute cardiac event. Patient was switched soreness in her chest with coughing.  Fredia Sorrow, MD 12/10/15 [phone]

## 2016-01-06 ENCOUNTER — Emergency Department (HOSPITAL_COMMUNITY)
Admission: EM | Admit: 2016-01-06 | Discharge: 2016-01-06 | Disposition: A | Discharge status: Home / Self Care | Payer: Medicare Other | Attending: Emergency Medicine | Admitting: Emergency Medicine

## 2016-01-06 ENCOUNTER — Encounter (HOSPITAL_COMMUNITY): Payer: Self-pay | Admitting: Nurse Practitioner

## 2016-01-06 ENCOUNTER — Emergency Department (HOSPITAL_COMMUNITY): Payer: Medicare Other

## 2016-01-06 DIAGNOSIS — E7800 Pure hypercholesterolemia, unspecified: Secondary | ICD-10-CM | POA: Diagnosis not present

## 2016-01-06 DIAGNOSIS — I10 Essential (primary) hypertension: Secondary | ICD-10-CM | POA: Insufficient documentation

## 2016-01-06 DIAGNOSIS — Z794 Long term (current) use of insulin: Secondary | ICD-10-CM | POA: Insufficient documentation

## 2016-01-06 DIAGNOSIS — Z79899 Other long term (current) drug therapy: Secondary | ICD-10-CM | POA: Diagnosis not present

## 2016-01-06 DIAGNOSIS — R519 Headache, unspecified: Secondary | ICD-10-CM

## 2016-01-06 DIAGNOSIS — I4891 Unspecified atrial fibrillation: Secondary | ICD-10-CM | POA: Insufficient documentation

## 2016-01-06 DIAGNOSIS — R51 Headache: Secondary | ICD-10-CM | POA: Diagnosis present

## 2016-01-06 DIAGNOSIS — Z7902 Long term (current) use of antithrombotics/antiplatelets: Secondary | ICD-10-CM | POA: Insufficient documentation

## 2016-01-06 DIAGNOSIS — I160 Hypertensive urgency: Secondary | ICD-10-CM | POA: Diagnosis not present

## 2016-01-06 DIAGNOSIS — E119 Type 2 diabetes mellitus without complications: Secondary | ICD-10-CM | POA: Diagnosis not present

## 2016-01-06 DIAGNOSIS — Z8673 Personal history of transient ischemic attack (TIA), and cerebral infarction without residual deficits: Secondary | ICD-10-CM | POA: Diagnosis not present

## 2016-01-06 LAB — COMPREHENSIVE METABOLIC PANEL
ALBUMIN: 3.7 g/dL (ref 3.5–5.0)
ALT: 19 U/L (ref 14–54)
AST: 25 U/L (ref 15–41)
Alkaline Phosphatase: 90 U/L (ref 38–126)
Anion gap: 7 (ref 5–15)
BUN: 10 mg/dL (ref 6–20)
CHLORIDE: 106 mmol/L (ref 101–111)
CO2: 26 mmol/L (ref 22–32)
Calcium: 9.5 mg/dL (ref 8.9–10.3)
Creatinine, Ser: 1.01 mg/dL — ABNORMAL HIGH (ref 0.44–1.00)
GFR calc Af Amer: >60 mL/min (ref >60)
GFR, EST NON AFRICAN AMERICAN: 59 mL/min — AB (ref >60)
Glucose, Bld: 176 mg/dL — ABNORMAL HIGH (ref 65–99)
POTASSIUM: 4.6 mmol/L (ref 3.5–5.1)
Sodium: 139 mmol/L (ref 135–145)
Total Bilirubin: 0.8 mg/dL (ref 0.3–1.2)
Total Protein: 7.7 g/dL (ref 6.5–8.1)

## 2016-01-06 LAB — CBC WITH DIFFERENTIAL/PLATELET
Basophils Absolute: 0 10*3/uL (ref 0.0–0.1)
Basophils Relative: 0 %
EOS PCT: 1 %
Eosinophils Absolute: 0.1 10*3/uL (ref 0.0–0.7)
HEMATOCRIT: 38.1 % (ref 36.0–46.0)
Hemoglobin: 12 g/dL (ref 12.0–15.0)
LYMPHS ABS: 3.3 10*3/uL (ref 0.7–4.0)
LYMPHS PCT: 43 %
MCH: 22.2 pg — AB (ref 26.0–34.0)
MCHC: 31.5 g/dL (ref 30.0–36.0)
MCV: 70.4 fL — AB (ref 78.0–100.0)
Monocytes Absolute: 0.4 10*3/uL (ref 0.1–1.0)
Monocytes Relative: 5 %
NEUTROS PCT: 51 %
Neutro Abs: 3.9 10*3/uL (ref 1.7–7.7)
Platelets: 300 10*3/uL (ref 150–400)
RBC: 5.41 MIL/uL — ABNORMAL HIGH (ref 3.87–5.11)
RDW: 15.3 % (ref 11.5–15.5)
WBC: 7.7 10*3/uL (ref 4.0–10.5)

## 2016-01-06 LAB — TROPONIN I: Troponin I: <0.03 ng/mL (ref <0.031)

## 2016-01-06 LAB — APTT: APTT: 30 s (ref 24–37)

## 2016-01-06 LAB — PROTIME-INR
INR: 1.06 (ref 0.00–1.49)
Prothrombin Time: 14 seconds (ref 11.6–15.2)

## 2016-01-06 LAB — ETHANOL: Alcohol, Ethyl (B): <5 mg/dL (ref <5)

## 2016-01-06 MED ORDER — SODIUM CHLORIDE 0.9 % IV SOLN
100.0000 mL/h | INTRAVENOUS | Status: DC
  Administered 2016-01-06: 100 mL/h via INTRAVENOUS

## 2016-01-06 MED ORDER — KETOROLAC TROMETHAMINE 30 MG/ML IJ SOLN
15.0000 mg | Freq: Once | INTRAMUSCULAR | Status: AC
  Administered 2016-01-06: 15 mg via INTRAMUSCULAR
  Filled 2016-01-06: qty 1

## 2016-01-06 MED ORDER — CLONIDINE HCL 0.2 MG PO TABS
0.2000 mg | ORAL_TABLET | Freq: Once | ORAL | Status: AC
  Administered 2016-01-06: 0.2 mg via ORAL
  Filled 2016-01-06: qty 1

## 2016-01-06 MED ORDER — LORAZEPAM 2 MG/ML IJ SOLN
1.0000 mg | Freq: Once | INTRAMUSCULAR | Status: AC
  Administered 2016-01-06: 1 mg via INTRAVENOUS
  Filled 2016-01-06: qty 1

## 2016-01-06 MED ORDER — AMLODIPINE BESYLATE 10 MG PO TABS: 10.0000 mg | ORAL_TABLET | Freq: Every day | ORAL | Status: DC

## 2016-01-06 MED ORDER — CARVEDILOL 25 MG PO TABS: 25.0000 mg | ORAL_TABLET | Freq: Two times a day (BID) | ORAL | Status: DC

## 2016-01-06 MED ORDER — LISINOPRIL 40 MG PO TABS: ORAL_TABLET | ORAL | Status: DC

## 2016-01-06 MED ORDER — LABETALOL HCL 5 MG/ML IV SOLN
20.0000 mg | INTRAVENOUS | Status: DC | PRN
  Administered 2016-01-06: 20 mg via INTRAVENOUS
  Filled 2016-01-06: qty 4

## 2016-01-06 MED ORDER — CLONIDINE HCL 0.2 MG PO TABS
0.2000 mg | ORAL_TABLET | Freq: Two times a day (BID) | ORAL | Status: DC
Start: 2016-01-06 — End: 2016-03-05

## 2016-01-06 MED ORDER — SODIUM CHLORIDE 0.9 % IV BOLUS (SEPSIS)
500.0000 mL | Freq: Once | INTRAVENOUS | Status: AC
  Administered 2016-01-06: 500 mL via INTRAVENOUS

## 2016-01-06 NOTE — ED Notes (Signed)
Passed swallow screen

## 2016-01-06 NOTE — ED Notes (Signed)
Pt still has headache no difference  bp still up

## 2016-01-06 NOTE — ED Provider Notes (Signed)
CSN: [id number]     Arrival date & time 01/06/16  1701 History   First MD Initiated Contact with Patient 01/06/16 1843     Chief Complaint  Patient presents with  . Headache     (Consider location/radiation/quality/duration/timing/severity/associated sxs/prior Treatment) HPI Patient presents with concern of headache Over the past few days she has had a recurrence of typical headache symptoms for her, intermittent episodes of sharp stabbing pain in both temples, diffuse. No relief with OTC medication. No new vision changes, weakness in any extremity, speech difficulty. No chest pain, belly pain. No fever, chills per Patient has multiple medical issues including prior stroke, A. fib Patient states that she does not take her Xarelto as she has concern of medication side effects.  Past Medical History  Diagnosis Date  . Hypertension   . Hypertension     Hypertensive urgency 05/2006  . Atrial fibrillation ([place])   . Hypercholesteremia   . 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 Diabetes mellitus     type 2   Past Surgical History  Procedure Laterality Date  . Tonsilectomy, adenoidectomy, bilateral myringotomy and tubes  age 9  . Dilation and curettage of uterus  1976  . Meniscus repair  03/09    right knee  . Tonsillectomy     Family History  Problem Relation Age of Onset  . Heart failure Mother   . Diabetes Father   . Hypertension Father   . Lung cancer Sister    Social History  Substance Use Topics  . Smoking status: Never Smoker   . Smokeless tobacco: Never Used  . Alcohol Use: No   OB History    No data available     Review of Systems  Constitutional:       Per HPI, otherwise negative  HENT:       Per HPI, otherwise negative  Respiratory:       Per HPI, otherwise negative  Cardiovascular:       Per HPI, otherwise negative  Gastrointestinal: Negative for vomiting.  Endocrine:       Negative aside from HPI   Genitourinary:       Neg aside from HPI   Musculoskeletal:       Per HPI, otherwise negative  Skin: Negative.   Neurological: Positive for headaches. Negative for syncope.      Allergies  Propoxyphene n-acetaminophen  Home Medications   Prior to Admission medications   Medication Sig Start Date End Date Taking? Authorizing Provider  albuterol (PROVENTIL HFA;VENTOLIN HFA) 108 (90 Base) MCG/ACT inhaler Inhale 2 puffs into the lungs every 6 (six) hours as needed for wheezing or shortness of breath. 12/10/15  Yes Fredia Sorrow, MD  amLODipine (NORVASC) 10 MG tablet Take 1 tablet (10 mg total) by mouth daily. 05/10/15  Yes Verner Mould, MD  carvedilol (COREG) 25 MG tablet Take 1 tablet (25 mg total) by mouth 2 (two) times daily with a meal. 05/10/15  Yes Verner Mould, MD  clopidogrel (PLAVIX) 75 MG tablet Take 1 tablet (75 mg total) by mouth daily. 05/10/15  Yes Verner Mould, MD  cyclobenzaprine (FLEXERIL) 5 MG tablet Take 1 tablet (5 mg total) by mouth 3 (three) times daily as needed for muscle spasms. 10/12/15  Yes Verner Mould, MD  insulin aspart (NOVOLOG) 100 UNIT/ML injection Inject 20 Units into the skin 3 (three) times daily with meals. Patient taking differently: Inject 20-25 Units into  the skin 3 (three) times daily as needed for high blood sugar (CBG >150).  06/11/15  Yes Verner Mould, MD  Insulin Glargine (LANTUS) 100 UNIT/ML Solostar Pen Inject 55 Units into the skin daily. Patient taking differently: Inject 55 Units into the skin at bedtime.  06/11/15  Yes Verner Mould, MD  Liraglutide (VICTOZA) 18 MG/3ML SOPN Inject 0.3 mLs (1.8 mg total) into the skin daily. Patient taking differently: Inject 1.8 mg into the skin at bedtime.  06/11/15  Yes Verner Mould, MD  lisinopril (PRINIVIL,ZESTRIL) 40 MG tablet TAKE 1 TABLET(40 MG) BY MOUTH DAILY Patient taking differently: Take 40 mg by mouth daily. TAKE 1  TABLET(40 MG) BY MOUTH DAIL 06/11/15  Yes Verner Mould, MD  nystatin (MYCOSTATIN) powder Apply topically 3 (three) times daily. Apply up to 3 times daily as needed. Patient taking differently: Apply 1 g topically 3 (three) times daily as needed (rash under breasts).  05/10/15  Yes Verner Mould, MD  omeprazole (PRILOSEC) 20 MG capsule Take 1 capsule (20 mg total) by mouth 2 (two) times daily before a meal. 06/11/15  Yes Verner Mould, MD  rosuvastatin (CRESTOR) 10 MG tablet Take 1 tablet (10 mg total) by mouth daily. Patient taking differently: Take 10 mg by mouth at bedtime.  05/10/15  Yes Verner Mould, MD  spironolactone (ALDACTONE) 25 MG tablet Take 1 tablet (25 mg total) by mouth daily. 06/11/15  Yes Verner Mould, MD  dextromethorphan-guaiFENesin [facility] DM) 30-600 MG 12hr tablet Take 1 tablet by mouth 2 (two) times daily. Patient not taking: Reported on 01/06/2016 12/10/15   Fredia Sorrow, MD  HYDROcodone-acetaminophen (NORCO/VICODIN) 5-325 MG tablet Take 1-2 tablets by mouth every 6 (six) hours as needed for moderate pain. Patient not taking: Reported on 01/06/2016 12/10/15   Fredia Sorrow, MD  ibuprofen (ADVIL,MOTRIN) 600 MG tablet Take 1 tablet (600 mg total) by mouth every 8 (eight) hours as needed. Patient not taking: Reported on 01/06/2016 10/12/15   Verner Mould, MD   BP 190/99 mmHg  Pulse 72  Temp(Src) 97.6 F (36.4 C) (Oral)  Resp 22  Ht 5\' 2"  (1.575 m)  Wt 265 lb 5 oz (120.345 kg)  BMI 48.51 kg/m2  SpO2 98% Physical Exam  Constitutional: She is oriented to person, place, and time. She appears well-developed and well-nourished. No distress.  HENT:  Head: Normocephalic and atraumatic.  Eyes: Conjunctivae and EOM are normal.  Cardiovascular: Normal rate and regular rhythm.   Pulmonary/Chest: Effort normal and breath sounds normal. No stridor. No respiratory distress.  Abdominal: She exhibits no distension.   Musculoskeletal: She exhibits no edema.  Neurological: She is alert and oriented to person, place, and time. She displays no atrophy and no tremor. No cranial nerve deficit. She exhibits normal muscle tone. She displays no seizure activity. Coordination normal.  Skin: Skin is warm and dry.  Psychiatric: She has a normal mood and affect.  Nursing note and vitals reviewed.   ED Course  Procedures (including critical care time) Labs Review Labs Reviewed  COMPREHENSIVE METABOLIC PANEL - Abnormal; Notable for the following:    Glucose, Bld 176 (*)    Creatinine, Ser 1.01 (*)    GFR calc non Af Amer 59 (*)    All other components within normal limits  CBC WITH DIFFERENTIAL/PLATELET - Abnormal; Notable for the following:    RBC 5.41 (*)    MCV 70.4 (*)    MCH 22.2 (*)    All other  components within normal limits  APTT  ETHANOL  TROPONIN I  PROTIME-INR  URINE RAPID DRUG SCREEN, HOSP PERFORMED  URINALYSIS, ROUTINE W REFLEX MICROSCOPIC (NOT AT [facility])    Imaging Review Ct Head Wo Contrast  01/06/2016  CLINICAL DATA:  60 year old female with sharp stabbing head pain intermittently for 2 days. Headache. Initial encounter. EXAM: CT HEAD WITHOUT CONTRAST TECHNIQUE: Contiguous axial images were obtained from the base of the skull through the vertex without intravenous contrast. COMPARISON:  Head CT 09/12/2015, brain MRI 06/02/2014, and earlier. FINDINGS: Mild ethmoid sinus mucosal thickening appears not significantly changed with other visible paranasal sinuses and mastoids well pneumatized. No osseous abnormality identified. Negative orbit soft tissues. Visualized scalp soft tissues are within normal limits. Chronic Calcified atherosclerosis at the skull base. Abnormal hypodensity re - demonstrated in the thalami and cerebral white matter compatible with chronic small vessel disease. Gray-white matter differentiation throughout the brain appears stable. No acute cortically based infarct identified.  No acute intracranial hemorrhage identified. No suspicious intracranial vascular hyperdensity. IMPRESSION: Stable CT appearance of chronic small vessel disease since 09/12/2015. No acute intracranial abnormality. Electronically Signed   By: Genevie Ann [name]   On: 01/06/2016 20:01   I have personally reviewed and evaluated these images and lab results as part of my medical decision-making.   EKG Interpretation   Date/Time:  Sunday January 06 2016 19:29:22 EDT Ventricular Rate:  79 PR Interval:  175 QRS Duration: 84 QT Interval:  383 QTC Calculation: 439 R Axis:   32 Text Interpretation:  Sinus rhythm Low voltage, precordial leads T wave  abnormality Abnormal ekg Confirmed by Dewayne Severe  MD ([id number]) on  01/06/2016 7:45:28 PM     11 :06 PM BP 175/85. Headache resolved. I discussed the patient's presentation with her and 2 daughters. We discussed the new blood pressure medication regimen, primary care follow-up. MDM  Patient presents with concern of ongoing head pain. Patient is initially hypertensive, and with her history of substantial hypertension she required multiple medications for the pressure lowering. This was achieved, headache resolved, and with no red flags for neurologic dysfunction the patient was discharged in stable condition to follow-up with primary care.   Carmin Muskrat, MD 01/06/16 [phone]

## 2016-01-06 NOTE — ED Notes (Signed)
Pt c/o 2 day history intermittent stabbing head pains. She tried 600mg  ibuprofen with no relief. She c/o feeling dizzy on Thursday. She denies n/v, fevers, loc. She is alert and breathing easily

## 2016-01-06 NOTE — ED Notes (Signed)
The pts headache is no better

## 2016-01-06 NOTE — ED Notes (Signed)
The pt is c/o a headache for several days with some blurred vision no  Facial droop good equal grips no arm or leg drift

## 2016-01-06 NOTE — Discharge Instructions (Signed)
As discussed, your evaluation today has been largely reassuring.  But, it is important that you monitor your condition carefully, and do not hesitate to return to the ED if you develop new, or concerning changes in your condition. ? ?Otherwise, please follow-up with your physician for appropriate ongoing care. ? ?

## 2016-01-06 NOTE — ED Notes (Signed)
Headache better  

## 2016-02-01 ENCOUNTER — Encounter (HOSPITAL_COMMUNITY): Payer: Self-pay | Admitting: Nurse Practitioner

## 2016-02-01 ENCOUNTER — Ambulatory Visit (HOSPITAL_COMMUNITY)
Admission: EM | Admit: 2016-02-01 | Discharge: 2016-02-01 | Disposition: A | Discharge status: Home / Self Care | Payer: Medicare Other | Attending: Emergency Medicine | Admitting: Emergency Medicine

## 2016-02-01 DIAGNOSIS — T8131XA Disruption of external operation (surgical) wound, not elsewhere classified, initial encounter: Secondary | ICD-10-CM | POA: Diagnosis not present

## 2016-02-01 NOTE — ED Provider Notes (Signed)
CSN: [id number]     Arrival date & time 02/01/16  1539 History   First MD Initiated Contact with Patient 02/01/16 1615     Chief Complaint  Patient presents with  . Skin Problem   (Consider location/radiation/quality/duration/timing/severity/associated sxs/prior Treatment) HPI Comments: Pleasant 60 year old morbidly obese female with a history of type 2 diabetes mellitus presents to the urgent care after "encouragement" by her family to have a small wound, left upper arm evaluated. She states that a small pimple formed to this area approximate 6 weeks ago and then opened up. It appears as though the area now has a very narrow epidermal separation that is exacerbated by flexion and extension of the arm. It measures approximately 6-7 mm in length and 2 mm in width. No other complaints. No systemic symptoms.   Past Medical History  Diagnosis Date  . Hypertension   . Hypertension     Hypertensive urgency 05/2006  . Atrial fibrillation ([place])   . Hypercholesteremia   . 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 Diabetes mellitus     type 2   Past Surgical History  Procedure Laterality Date  . Tonsilectomy, adenoidectomy, bilateral myringotomy and tubes  age 39  . Dilation and curettage of uterus  1976  . Meniscus repair  03/09    right knee  . Tonsillectomy     Family History  Problem Relation Age of Onset  . Heart failure Mother   . Diabetes Father   . Hypertension Father   . Lung cancer Sister    Social History  Substance Use Topics  . Smoking status: Never Smoker   . Smokeless tobacco: Never Used  . Alcohol Use: No   OB History    No data available     Review of Systems  Constitutional: Negative.   HENT: Negative.   Respiratory: Negative.   Skin: Positive for wound.  Neurological: Negative.   All other systems reviewed and are negative.   Allergies  Propoxyphene n-acetaminophen  Home Medications   Prior to Admission  medications   Medication Sig Start Date End Date Taking? Authorizing Provider  albuterol (PROVENTIL HFA;VENTOLIN HFA) 108 (90 Base) MCG/ACT inhaler Inhale 2 puffs into the lungs every 6 (six) hours as needed for wheezing or shortness of breath. 12/10/15   Fredia Sorrow, MD  amLODipine (NORVASC) 10 MG tablet Take 1 tablet (10 mg total) by mouth daily. 01/06/16   Carmin Muskrat, MD  carvedilol (COREG) 25 MG tablet Take 1 tablet (25 mg total) by mouth 2 (two) times daily with a meal. 01/06/16   Carmin Muskrat, MD  cloNIDine (CATAPRES) 0.2 MG tablet Take 1 tablet (0.2 mg total) by mouth 2 (two) times daily. 01/06/16   Carmin Muskrat, MD  clopidogrel (PLAVIX) 75 MG tablet Take 1 tablet (75 mg total) by mouth daily. 05/10/15   Verner Mould, MD  insulin aspart (NOVOLOG) 100 UNIT/ML injection Inject 20 Units into the skin 3 (three) times daily with meals. Patient taking differently: Inject 20-25 Units into the skin 3 (three) times daily as needed for high blood sugar (CBG >150).  06/11/15   Verner Mould, MD  Insulin Glargine (LANTUS) 100 UNIT/ML Solostar Pen Inject 55 Units into the skin daily. Patient taking differently: Inject 55 Units into the skin at bedtime.  06/11/15   Verner Mould, MD  Liraglutide (VICTOZA) 18 MG/3ML SOPN Inject 0.3 mLs (1.8 mg total) into the skin daily. Patient taking differently:  Inject 1.8 mg into the skin at bedtime.  06/11/15   Verner Mould, MD  lisinopril (PRINIVIL,ZESTRIL) 40 MG tablet TAKE 1 TABLET(40 MG) BY MOUTH DAILY 01/06/16   Carmin Muskrat, MD  nystatin (MYCOSTATIN) powder Apply topically 3 (three) times daily. Apply up to 3 times daily as needed. Patient taking differently: Apply 1 g topically 3 (three) times daily as needed (rash under breasts).  05/10/15   Verner Mould, MD  omeprazole (PRILOSEC) 20 MG capsule Take 1 capsule (20 mg total) by mouth 2 (two) times daily before a meal. 06/11/15   Verner Mould,  MD  rosuvastatin (CRESTOR) 10 MG tablet Take 1 tablet (10 mg total) by mouth daily. Patient taking differently: Take 10 mg by mouth at bedtime.  05/10/15   Verner Mould, MD  spironolactone (ALDACTONE) 25 MG tablet Take 1 tablet (25 mg total) by mouth daily. 06/11/15   Verner Mould, MD   Meds Ordered and Administered this Visit  Medications - No data to display  BP 179/90 mmHg  Pulse 83  Temp(Src) 98.4 F (36.9 C) (Oral)  Resp 16  SpO2 98% No data found.   Physical Exam  Constitutional: She is oriented to person, place, and time. She appears well-developed and well-nourished. No distress.  Eyes: EOM are normal.  Neck: Normal range of motion. Neck supple.  Cardiovascular: Normal rate.   Pulmonary/Chest: Effort normal. No respiratory distress.  Musculoskeletal: She exhibits no edema.  Neurological: She is alert and oriented to person, place, and time. She exhibits normal muscle tone.  Skin: Skin is warm and dry.  There is a fat crease to the left upper arm above the elbow over the bicep. Within the small crease is a superficial lesion of the epidermis. Dimensions as above. The depth is 1 mm or less. There is no current drainage. It is dry, no surrounding erythema or lymphangitis. No drainage or purulence. No underlying induration or other signs of infection. No odor is detected.  Psychiatric: She has a normal mood and affect.  Nursing note and vitals reviewed.   ED Course  Procedures (including critical care time)  Labs Review Labs Reviewed - No data to display  Imaging Review No results found.   Visual Acuity Review  Right Eye Distance:   Left Eye Distance:   Bilateral Distance:    Right Eye Near:   Left Eye Near:    Bilateral Near:         MDM   1. Wound dehiscence, initial encounter    Although this is not a wound resulting from a surgical incision is a wound that opens up due to extension of the arm and remains moist and open with  flexion due to body habitus. We will keep the wound. Dry with a 2 x 2 and she will wash it and dry it daily. Wound Dehiscence This is a small opening of the wound of the top of the skin, likely due to movement of the arm. There are currently no signs of infection. It is not deep at all it is very shallow. Keeping it dry is the best treatment. Make sure it is clean and dried every day and keep it dry bandage over the wound to keep from having scant to skin contact. This should allow healing to occur. This does not look bad and should heal up within the next week or so. He may return if there are any problems. Continue to watch for any signs of infection  as listed below, especially redness, pus, tenderness or pain or red streaks. At this time with no signs of infection and antibiotics are necessary.    Janne Napoleon, NP 02/01/16 [phone]

## 2016-02-01 NOTE — ED Notes (Signed)
Pt presents with an open sore to L upper inner arm in skin fold. Reports it has been present since may. She has been cleaning daily with soap and water. It began as a bump and progressed to an open wound. She reports a foul smell from the wound. States it is itchy but not painful

## 2016-02-01 NOTE — Discharge Instructions (Signed)
Wound Dehiscence This is a small opening of the wound of the top of the skin, likely due to movement of the arm. There are currently no signs of infection. It is not deep at all it is very shallow. Keeping it dry is the best treatment. Make sure it is clean and dried every day and keep it dry bandage over the wound to keep from having scant to skin contact. This should allow healing to occur. This does not look bad and should heal up within the next week or so. He may return if there are any problems. Continue to watch for any signs of infection as listed below, especially redness, pus, tenderness or pain or red streaks.  You may have bleeding from the cut. You may also have pain or a fever. This condition should be treated early. HOME CARE  Only take medicines as told by your doctor.  Take your antibiotic medicine as told. Finish it even if you start to feel better.  Wash your wound with warm, soapy water 2 times a day, or as told. Pat the wound dry. Do not rub the wound.  Change bandages (dressings) as often as told. Wash your hands before and after changing bandages. Apply bandages as told.  Take showers. Do not soak the wound, bathe, swim, or use a hot tub until directed by your doctor.  Avoid exercises that make you sweat.  Use medicines that stop itching as told by your doctor. The wound may itch as it heals. Do not pick or scratch at the wound.  Do not lift more than 10 pounds (4.5 kilograms) until the wound is healed, or as told by your doctor.  Keep all doctor visits as told. GET HELP IF:  You have a lot of bleeding from your surgical cut.  Your wound does not seem to be healing right.  You have a fever. GET HELP RIGHT AWAY IF:  You have more puffiness (swelling) or redness around the wound.  You have more pain in the wound.  You have yellowish white fluid (pus) coming from the wound.  More of the wound breaks open. MAKE SURE YOU:  Understand these  instructions.  Will watch your condition.  Will get help right away if you are not doing well or get worse.   This information is not intended to replace advice given to you by your health care provider. Make sure you discuss any questions you have with your health care provider.   Document Released: 07/09/2009 Document Revised: 08/11/2014 Document Reviewed: 03/28/2013 Elsevier Interactive Patient Education 2016 [place].  Wound Dehiscence Wound dehiscence is when a surgical cut (incision) opens up and does not heal properly. It usually happens 7-10 days after surgery. You may have bleeding from the cut. You may also have pain or a fever. This condition should be treated early. HOME CARE  Only take medicines as told by your doctor.  Take your antibiotic medicine as told. Finish it even if you start to feel better.  Wash your wound with warm, soapy water 2 times a day, or as told. Pat the wound dry. Do not rub the wound.  Change bandages (dressings) as often as told. Wash your hands before and after changing bandages. Apply bandages as told.  Take showers. Do not soak the wound, bathe, swim, or use a hot tub until directed by your doctor.  Avoid exercises that make you sweat.  Use medicines that stop itching as told by your doctor. The wound  may itch as it heals. Do not pick or scratch at the wound.  Do not lift more than 10 pounds (4.5 kilograms) until the wound is healed, or as told by your doctor.  Keep all doctor visits as told. GET HELP IF:  You have a lot of bleeding from your surgical cut.  Your wound does not seem to be healing right.  You have a fever. GET HELP RIGHT AWAY IF:  You have more puffiness (swelling) or redness around the wound.  You have more pain in the wound.  You have yellowish white fluid (pus) coming from the wound.  More of the wound breaks open. MAKE SURE YOU:  Understand these instructions.  Will watch your condition.  Will get  help right away if you are not doing well or get worse.   This information is not intended to replace advice given to you by your health care provider. Make sure you discuss any questions you have with your health care provider.   Document Released: 07/09/2009 Document Revised: 08/11/2014 Document Reviewed: 03/28/2013 Elsevier Interactive Patient Education [employer].

## 2016-02-16 ENCOUNTER — Inpatient Hospital Stay (HOSPITAL_COMMUNITY)
Admission: EM | Admit: 2016-02-16 | Discharge: 2016-02-18 | DRG: 065 | Disposition: A | Discharge status: Home Health | Payer: Medicare Other | Attending: Family Medicine | Admitting: Family Medicine

## 2016-02-16 ENCOUNTER — Encounter (HOSPITAL_COMMUNITY): Payer: Self-pay

## 2016-02-16 ENCOUNTER — Emergency Department (HOSPITAL_COMMUNITY): Payer: Medicare Other

## 2016-02-16 DIAGNOSIS — N179 Acute kidney failure, unspecified: Secondary | ICD-10-CM | POA: Diagnosis present

## 2016-02-16 DIAGNOSIS — I639 Cerebral infarction, unspecified: Principal | ICD-10-CM | POA: Diagnosis present

## 2016-02-16 DIAGNOSIS — I480 Paroxysmal atrial fibrillation: Secondary | ICD-10-CM | POA: Insufficient documentation

## 2016-02-16 DIAGNOSIS — E1165 Type 2 diabetes mellitus with hyperglycemia: Secondary | ICD-10-CM | POA: Insufficient documentation

## 2016-02-16 DIAGNOSIS — F329 Major depressive disorder, single episode, unspecified: Secondary | ICD-10-CM | POA: Diagnosis present

## 2016-02-16 DIAGNOSIS — I63411 Cerebral infarction due to embolism of right middle cerebral artery: Secondary | ICD-10-CM

## 2016-02-16 DIAGNOSIS — G8194 Hemiplegia, unspecified affecting left nondominant side: Secondary | ICD-10-CM | POA: Diagnosis present

## 2016-02-16 DIAGNOSIS — Z801 Family history of malignant neoplasm of trachea, bronchus and lung: Secondary | ICD-10-CM | POA: Diagnosis not present

## 2016-02-16 DIAGNOSIS — Z8249 Family history of ischemic heart disease and other diseases of the circulatory system: Secondary | ICD-10-CM

## 2016-02-16 DIAGNOSIS — M6289 Other specified disorders of muscle: Secondary | ICD-10-CM | POA: Diagnosis not present

## 2016-02-16 DIAGNOSIS — IMO0002 Reserved for concepts with insufficient information to code with codable children: Secondary | ICD-10-CM | POA: Diagnosis present

## 2016-02-16 DIAGNOSIS — I10 Essential (primary) hypertension: Secondary | ICD-10-CM | POA: Diagnosis not present

## 2016-02-16 DIAGNOSIS — Z833 Family history of diabetes mellitus: Secondary | ICD-10-CM

## 2016-02-16 DIAGNOSIS — Z794 Long term (current) use of insulin: Secondary | ICD-10-CM | POA: Diagnosis not present

## 2016-02-16 DIAGNOSIS — E785 Hyperlipidemia, unspecified: Secondary | ICD-10-CM | POA: Diagnosis present

## 2016-02-16 DIAGNOSIS — Z9114 Patient's other noncompliance with medication regimen: Secondary | ICD-10-CM | POA: Diagnosis not present

## 2016-02-16 DIAGNOSIS — R531 Weakness: Secondary | ICD-10-CM | POA: Diagnosis not present

## 2016-02-16 DIAGNOSIS — B9689 Other specified bacterial agents as the cause of diseases classified elsewhere: Secondary | ICD-10-CM | POA: Insufficient documentation

## 2016-02-16 DIAGNOSIS — N760 Acute vaginitis: Secondary | ICD-10-CM | POA: Insufficient documentation

## 2016-02-16 DIAGNOSIS — I6502 Occlusion and stenosis of left vertebral artery: Secondary | ICD-10-CM | POA: Diagnosis not present

## 2016-02-16 DIAGNOSIS — G4733 Obstructive sleep apnea (adult) (pediatric): Secondary | ICD-10-CM | POA: Diagnosis present

## 2016-02-16 DIAGNOSIS — E1122 Type 2 diabetes mellitus with diabetic chronic kidney disease: Secondary | ICD-10-CM | POA: Diagnosis present

## 2016-02-16 DIAGNOSIS — I4891 Unspecified atrial fibrillation: Secondary | ICD-10-CM | POA: Diagnosis present

## 2016-02-16 DIAGNOSIS — E669 Obesity, unspecified: Secondary | ICD-10-CM | POA: Diagnosis present

## 2016-02-16 DIAGNOSIS — I129 Hypertensive chronic kidney disease with stage 1 through stage 4 chronic kidney disease, or unspecified chronic kidney disease: Secondary | ICD-10-CM | POA: Diagnosis present

## 2016-02-16 DIAGNOSIS — E7800 Pure hypercholesterolemia, unspecified: Secondary | ICD-10-CM | POA: Diagnosis present

## 2016-02-16 DIAGNOSIS — Z6841 Body Mass Index (BMI) 40.0 and over, adult: Secondary | ICD-10-CM

## 2016-02-16 DIAGNOSIS — E1159 Type 2 diabetes mellitus with other circulatory complications: Secondary | ICD-10-CM | POA: Diagnosis not present

## 2016-02-16 DIAGNOSIS — N182 Chronic kidney disease, stage 2 (mild): Secondary | ICD-10-CM | POA: Diagnosis present

## 2016-02-16 DIAGNOSIS — R4701 Aphasia: Secondary | ICD-10-CM | POA: Diagnosis present

## 2016-02-16 DIAGNOSIS — E118 Type 2 diabetes mellitus with unspecified complications: Secondary | ICD-10-CM | POA: Insufficient documentation

## 2016-02-16 DIAGNOSIS — E0800 Diabetes mellitus due to underlying condition with hyperosmolarity without nonketotic hyperglycemic-hyperosmolar coma (NKHHC): Secondary | ICD-10-CM | POA: Diagnosis not present

## 2016-02-16 DIAGNOSIS — I6789 Other cerebrovascular disease: Secondary | ICD-10-CM | POA: Diagnosis not present

## 2016-02-16 DIAGNOSIS — R29810 Facial weakness: Secondary | ICD-10-CM | POA: Diagnosis present

## 2016-02-16 LAB — CBC
HCT: 41.9 % (ref 36.0–46.0)
Hemoglobin: 13.2 g/dL (ref 12.0–15.0)
MCH: 22.6 pg — ABNORMAL LOW (ref 26.0–34.0)
MCHC: 31.5 g/dL (ref 30.0–36.0)
MCV: 71.9 fL — ABNORMAL LOW (ref 78.0–100.0)
PLATELETS: 324 10*3/uL (ref 150–400)
RBC: 5.83 MIL/uL — ABNORMAL HIGH (ref 3.87–5.11)
RDW: 15.2 % (ref 11.5–15.5)
WBC: 9.3 10*3/uL (ref 4.0–10.5)

## 2016-02-16 LAB — BASIC METABOLIC PANEL
Anion gap: 9 (ref 5–15)
BUN: 15 mg/dL (ref 6–20)
CO2: 23 mmol/L (ref 22–32)
CREATININE: 1.27 mg/dL — AB (ref 0.44–1.00)
Calcium: 9.7 mg/dL (ref 8.9–10.3)
Chloride: 107 mmol/L (ref 101–111)
GFR calc Af Amer: 52 mL/min — ABNORMAL LOW (ref >60)
GFR calc non Af Amer: 45 mL/min — ABNORMAL LOW (ref >60)
Glucose, Bld: 176 mg/dL — ABNORMAL HIGH (ref 65–99)
POTASSIUM: 3.7 mmol/L (ref 3.5–5.1)
Sodium: 139 mmol/L (ref 135–145)

## 2016-02-16 LAB — TROPONIN I: Troponin I: <0.03 ng/mL (ref <0.03)

## 2016-02-16 LAB — GLUCOSE, CAPILLARY: GLUCOSE-CAPILLARY: 195 mg/dL — AB (ref 65–99)

## 2016-02-16 MED ORDER — INSULIN GLARGINE 100 UNIT/ML ~~LOC~~ SOLN
25.0000 [IU] | Freq: Every day | SUBCUTANEOUS | Status: DC
  Administered 2016-02-17 (×2): 25 [IU] via SUBCUTANEOUS
  Filled 2016-02-16 (×4): qty 0.25

## 2016-02-16 MED ORDER — CLONIDINE HCL 0.2 MG PO TABS
0.2000 mg | ORAL_TABLET | Freq: Two times a day (BID) | ORAL | Status: DC
  Administered 2016-02-16: 0.2 mg via ORAL
  Filled 2016-02-16: qty 1

## 2016-02-16 MED ORDER — ASPIRIN 300 MG RE SUPP: 300.0000 mg | Freq: Once | RECTAL | Status: AC

## 2016-02-16 MED ORDER — INSULIN ASPART 100 UNIT/ML ~~LOC~~ SOLN
0.0000 [IU] | Freq: Three times a day (TID) | SUBCUTANEOUS | Status: DC
  Administered 2016-02-17: 5 [IU] via SUBCUTANEOUS
  Administered 2016-02-18: 2 [IU] via SUBCUTANEOUS
  Administered 2016-02-18: 3 [IU] via SUBCUTANEOUS

## 2016-02-16 MED ORDER — STROKE: EARLY STAGES OF RECOVERY BOOK
Freq: Once | Status: DC
  Filled 2016-02-16: qty 1

## 2016-02-16 MED ORDER — SODIUM CHLORIDE 0.9 % IV SOLN
INTRAVENOUS | Status: DC
  Administered 2016-02-16: via INTRAVENOUS

## 2016-02-16 MED ORDER — ATORVASTATIN CALCIUM 80 MG PO TABS
80.0000 mg | ORAL_TABLET | Freq: Every day | ORAL | Status: DC
  Administered 2016-02-17: 80 mg via ORAL
  Filled 2016-02-16: qty 1

## 2016-02-16 MED ORDER — LORAZEPAM 1 MG PO TABS
1.0000 mg | ORAL_TABLET | Freq: Once | ORAL | Status: AC
  Administered 2016-02-16: 1 mg via ORAL
  Filled 2016-02-16: qty 1

## 2016-02-16 MED ORDER — SENNOSIDES-DOCUSATE SODIUM 8.6-50 MG PO TABS: 1.0000 | ORAL_TABLET | Freq: Every evening | ORAL | Status: DC | PRN

## 2016-02-16 MED ORDER — ASPIRIN 325 MG PO TABS
325.0000 mg | ORAL_TABLET | Freq: Once | ORAL | Status: AC
  Administered 2016-02-16: 325 mg via ORAL
  Filled 2016-02-16 (×2): qty 1

## 2016-02-16 MED ORDER — AMLODIPINE BESYLATE 5 MG PO TABS
10.0000 mg | ORAL_TABLET | Freq: Every day | ORAL | Status: DC
  Administered 2016-02-16: 10 mg via ORAL
  Filled 2016-02-16: qty 2

## 2016-02-16 MED ORDER — LISINOPRIL 20 MG PO TABS
40.0000 mg | ORAL_TABLET | Freq: Every day | ORAL | Status: DC
  Administered 2016-02-16: 40 mg via ORAL
  Filled 2016-02-16: qty 2

## 2016-02-16 MED ORDER — CARVEDILOL 12.5 MG PO TABS
25.0000 mg | ORAL_TABLET | Freq: Two times a day (BID) | ORAL | Status: DC
  Administered 2016-02-16: 25 mg via ORAL
  Filled 2016-02-16: qty 2

## 2016-02-16 MED ORDER — LORAZEPAM 2 MG/ML IJ SOLN
1.0000 mg | Freq: Once | INTRAMUSCULAR | Status: AC
  Administered 2016-02-16: 1 mg via INTRAVENOUS
  Filled 2016-02-16: qty 1

## 2016-02-16 MED ORDER — INSULIN ASPART 100 UNIT/ML ~~LOC~~ SOLN: 0.0000 [IU] | Freq: Every day | SUBCUTANEOUS | Status: DC

## 2016-02-16 NOTE — Consult Note (Signed)
NEURO HOSPITALIST CONSULT NOTE   Requestig physician: Dr. Wyvonnia Dusky   Reason for Consult: CVA   History obtained from:  Patient and Chart     [name] is a lovely 60 year old patient she presents today with a history of palpitations however there is suspicion of a possible mild left pronator drift during her evaluation. Given this finding but he underwent MRI of the brain or in the MRI of the brain has been reviewed reveals a new small acute cerebral infarction in the right subcortical semi-ovale white matter. There is evidence of an old infarct in the right pontine region there is also significant microvascular ischemic change several other small likely subclinical infarcts are also noted however these appear chronic in nature.  [name] does not appreciate any focal neurological deficits she reports no cranial nerve symptoms there is no dysphagia dysarthria or diplopia she reports no incoordination she's describing no changes in her vision.  [name] had been prescribed Xarelto given the fact that she had a prior ischemic event and has a known history of atrial fibrillation. Unfortunately there was an issue with the cost of the medication she has not been on any prophylaxis for stroke due to that issue.     HPI:                                                                                                                                          [name] is an 60 y.o. female   Past Medical History  Diagnosis Date  . Hypertension   . Hypertension     Hypertensive urgency 05/2006  . Atrial fibrillation ([place])   . Hypercholesteremia   . 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 Diabetes mellitus     type 2    Past Surgical History  Procedure Laterality Date  . Tonsilectomy, adenoidectomy, bilateral myringotomy and tubes  age 57  . Dilation and curettage of uterus  1976  . Meniscus repair  03/09    right knee  . Tonsillectomy       Family History  Problem Relation Age of Onset  . Heart failure Mother   . Diabetes Father   . Hypertension Father   . Lung cancer Sister     Family History: reviewed  Social History:  reports that she has never smoked. She has never used smokeless tobacco. She reports that she does not drink alcohol or use illicit drugs.  Allergies  Allergen Reactions  . Propoxyphene N-Acetaminophen Hives, Itching, Swelling and Other (See Comments)    REACTION: Hallucinations    MEDICATIONS:  I have reviewed the patient's current medications.   ROS:                                                                                                                                       History obtained from chart review  General ROS: negative for - chills, fatigue, fever, night sweats, weight gain or weight loss Psychological ROS: negative for - behavioral disorder, hallucinations, memory difficulties, mood swings or suicidal ideation Ophthalmic ROS: negative for - blurry vision, double vision, eye pain or loss of vision ENT ROS: negative for - epistaxis, nasal discharge, oral lesions, sore throat, tinnitus or vertigo Allergy and Immunology ROS: negative for - hives or itchy/watery eyes Hematological and Lymphatic ROS: negative for - bleeding problems, bruising or swollen lymph nodes Endocrine ROS: negative for - galactorrhea, hair pattern changes, polydipsia/polyuria or temperature intolerance Respiratory ROS: negative for - cough, hemoptysis, shortness of breath or wheezing Cardiovascular ROS: negative for - chest pain, dyspnea on exertion, edema or irregular heartbeat Gastrointestinal ROS: negative for - abdominal pain, diarrhea, hematemesis, nausea/vomiting or stool incontinence Genito-Urinary ROS: negative for - dysuria, hematuria, incontinence or urinary  frequency/urgency Musculoskeletal ROS: negative for - joint swelling or muscular weakness Neurological ROS: as noted in HPI Dermatological ROS: negative for rash and skin lesion changes   Blood pressure 133/91, pulse 92, temperature 98.4 F (36.9 C), temperature source Oral, resp. rate 16, height 5\' 2"  (1.575 m), weight 117.527 kg (259 lb 1.6 oz), SpO2 95 %.   Neurologic Examination:                                                                                                      HEENT-  Normocephalic, no lesions, without obvious abnormality.  Normal external eye and conjunctiva.  Normal TM's bilaterally.  Normal auditory canals and external ears. Normal external nose, mucus membranes and septum.  Normal pharynx. Cardiovascular- irregularly irregular rhythm, pulses palpable throughout   Lungs- chest clear, no wheezing, rales, normal symmetric air entry, Heart exam - S1, S2 normal, no murmur, no gallop, rate regular Abdomen- soft, non-tender; bowel sounds normal; no masses,  no organomegaly Extremities- less then 2 second capillary refill Lymph-no adenopathy palpable Musculoskeletal-no joint tenderness, deformity or swelling Skin-warm and dry, no hyperpigmentation, vitiligo, or suspicious lesions  Neurological Examination Mental Status: Alert, oriented, thought content appropriate.  Speech fluent without evidence of aphasia.  Able to follow 3 step commands without difficulty. Cranial Nerves: II: Discs flat bilaterally; Visual fields grossly normal,  pupils equal, round, reactive to light and accommodation III,IV, VI: ptosis not present, extra-ocular motions intact bilaterally V,VII: smile symmetric, facial light touch sensation normal bilaterally VIII: hearing normal bilaterally IX,X: uvula rises symmetrically XI: bilateral shoulder shrug XII: midline tongue extension Motor: Right : Upper extremity   5/5    Left:     Upper extremity   5/5  Lower extremity   5/5     Lower extremity    5/5 Tone and bulk:normal tone throughout; no atrophy noted Sensory: Pinprick and light touch intact throughout, bilaterally Deep Tendon Reflexes: 2+ and symmetric throughout Plantars: Right: downgoing   Left: downgoing Cerebellar: normal finger-to-nose, normal rapid alternating movements and normal heel-to-shin test Gait: normal gait and station      Lab Results: Basic Metabolic Panel:  Recent Labs Lab 02/16/16 1150  NA 139  K 3.7  CL 107  CO2 23  GLUCOSE 176*  BUN 15  CREATININE 1.27*  CALCIUM 9.7    Liver Function Tests: No results for input(s): AST, ALT, ALKPHOS, BILITOT, PROT, ALBUMIN in the last 168 hours. No results for input(s): LIPASE, AMYLASE in the last 168 hours. No results for input(s): AMMONIA in the last 168 hours.  CBC:  Recent Labs Lab 02/16/16 1150  WBC 9.3  HGB 13.2  HCT 41.9  MCV 71.9*  PLT 324    Cardiac Enzymes:  Recent Labs Lab 02/16/16 1150  TROPONINI <0.03    Lipid Panel: No results for input(s): CHOL, TRIG, HDL, CHOLHDL, VLDL, LDLCALC in the last 168 hours.  CBG: No results for input(s): GLUCAP in the last 168 hours.  Microbiology: Results for orders placed or performed during the hospital encounter of 10/30/12  Urine culture     Status: None   Collection Time: 10/29/12 10:46 PM  Result Value Ref Range Status   Specimen Description URINE, RANDOM  Final   Special Requests CX ADDED AT 2305 ON [id number]  Final   Culture  Setup Time 10/30/2012 03:33  Final   Colony Count >=100,000 COLONIES/ML  Final   Culture   Final    Multiple bacterial morphotypes present, none predominant. Suggest appropriate recollection if clinically indicated.   Report Status 10/31/2012 FINAL  Final    Coagulation Studies: No results for input(s): LABPROT, INR in the last 72 hours.  Imaging: Dg Chest 2 View  02/16/2016  CLINICAL DATA:  Atrial fibrillation. EXAM: CHEST  2 VIEW COMPARISON:  12/10/2015 FINDINGS: The heart size and mediastinal  contours are within normal limits. Both lungs are clear. The visualized skeletal structures are unremarkable. IMPRESSION: No active cardiopulmonary disease. Electronically Signed   By: Misty Stanley [name]   On: 02/16/2016 13:11    Assessment/Plan:  This is a lovely 60 year old patient presents today with palpitations she was noted to have a subtle left pronator drift on exam although patient herself did not appreciate weakness. An MRI of the brain was performed reveals evidence of old ischemic change however there is a very small new's lacunar infarct in the right semi-ovale region. A significant risk factor for stroke and that he is a known history of atrial fibrillation but unfortunately she was having issues affording Xarelto. But he also has a known history of hyperlipidemia and is on medication for this at this time.  Plan:  1. Tionni is being admitted to the hospitalist service. Standard stroke protocol is recommended. 2. Further evaluation with echocardiogram and carotid Dopplers 3. Routine DVT prophylaxis 4. Dysphagia screen. 5. Routine telemetry monitoring 6. Continue appropriate  anticoagulation Xarelto is an option 7. Lipid panel, the patient is noted to be on Crestor at this time.  Thank you for consulting neurology service to assist in the care of your patient!    James A. Tasia Catchings, [name] Neurohospitalist Phone: [phone]  02/16/2016, 4:26 PM

## 2016-02-16 NOTE — H&P (Signed)
[facility] Admission History and Physical Service Pager: [phone]  Patient name: [name] Medical record number: [id number] Date of birth: 21-Jun-1956 Age: 60 y.o. Gender: female  Primary Care Provider: Adin Hector, MD Consultants: Neurology  Code Status: Full (obtained on admission)  Chief Complaint: Palpitation and left-sided weakness  Assessment and Plan: [name] is a 60 y.o. female presenting with stroke. PMH is significant for stroke, diabetes, hypertension, atrial fibrillation, depression, obesity  Stroke: CT head negative for hemorrhagic process.  MRI brain with an 8 mm acute nonhemorrhagic linear infarct within the right external capsule. MRA benign. Neuro exam within normal limits except for mild left-sided weakness in both upper and lower extremities and mild aphasia. Patient has history of flutter fibrillation but not on any anticoagulation due to cost. She is also not on any statin. -Admit to SDU. Attending Mcdiarmd.  -Neuro consulted and appreciate Rex -Stroke work up:  -Carotid doppler (if no CTA) -Echo -Risk stratification labs (lipid panel, A1c and TSH) -UDS -Neurovascular check q2hrs -Cardiac monitor -PT/OT/SLP -NPO pending SLP eval -IVF (No dextrose containing fluid) -Fall precaution  Hypertension: Hypertensive to 190s over 120s in ED. Was given a home blood pressure medication in ED. -Old home blood pressure medications -Permissive hypertension  Diabetes: On Lantus 55 units,  NovoLog 20-25 units 3 times a day with meals and Victoza 1.8 at bedtime at home. BMP sugar 176 -Lantus 20 units at night -SSI intermediates -A1c  Atrial fibrillation: Not on any medication for this mainly due to cost issue. CHAD-Vasc score 4-5. She is in sinus rhythm now.  -We will hold off anticoagulation at this point due to risk of hemorrhagic conversion -Troponin negative 2  AKI: Creatinine  1.27. Baseline 0.9-1.  -Normal saline at 100 mL per hour -BMP in the morning  Depression: She was a little bit tearful during interview. Otherwise is stable. Not on any medication.  Social: Patient was not able to afford her medications -Consult care management for assistance with his medications  FEN/GI:  -Normal saline at 100 mL/h -Nothing by mouth pending SLP  Prophylaxis:  -SCD  Disposition: Admit to stepdown for further stroke workup and management  History of Present Illness:  [name] is a 60 y.o. female presenting with stroke  About 10 am this morning,  she was outside waiting for a truck to come when she felt hear heart racing, sweating and shaky, left face droopy, slurred speech, weakness in her left arm and legs. She also felt like her blood sugar is low. Didn't check her blood sugar. Daughter gave her some juice but this didn't help with heart racing. She went home.   About 11 am, her symptoms started to ease up. Daughter recommended going to Ed. After she got here she started having headache which is frontal. Denies numbness or tingling. Reports intermittent blurry vision at baseline. Denies chest pain  She reports taking her blood pressure medication except this morning.   She states she was on Xarelto for Afib but couldn't afford it. So she hasn't taken this in 6 months.  She is also not taking any statin or aspirin.   ED course: Vital signs remarkable for low pressure to 187/113. Was given a home blood pressure medication (amlodipine, carvedilol, clonidine, lisinopril as well as lorazepam). CT head negative for hemorrhagic stroke. MRI brain with an 8 mm linear white matter 19 hemorrhagic infarct within the right external capsule. MRA benign. Neurology consulted in ED and recommended  a routine standard stroke protocol.  Review Of Systems: Per HPI Otherwise the remainder of the systems were negative.  Patient Active Problem List   Diagnosis Date Noted  . Upper  back pain 10/12/2015  . Abdominal pain 06/11/2015  . Leg cramps, sleep related 06/11/2015  . Pain of right scapula 03/14/2015  . Dental abscess 12/07/2014  . Chronic dental pain 10/13/2014  . Intractable migraine without aura and without status migrainosus 02/21/2014  . Dyspnea 01/24/2014  . Mild obstructive sleep apnea 11/18/2013  . Insomnia 11/18/2013  . Trapezius muscle strain 09/07/2013  . Other social stressor 07/15/2013  . Sinus congestion 07/12/2013  . Toenail fungus 12/30/2012  . Obese 10/22/2012  . Abnormality of gait and mobility 08/18/2011  . Edema 08/12/2011  . Constipation 05/28/2011  . History of CVA (cerebrovascular accident) 05/20/2011  . Chest pain 05/20/2011  . Hot flashes 04/28/2011  . Headache 11/18/2010  . Long term (current) use of anticoagulants 09/14/2010  . CARPAL TUNNEL SYNDROME, LEFT 02/19/2010  . Diabetes mellitus out of control ([place]) 06/12/2009  . Atrial fibrillation ([place]) 06/07/2009  . Dermatophytosis of foot 03/13/2009  . DEPRESSION 09/29/2008  . Hyperlipidemia associated with type 2 diabetes mellitus ([place]) 08/22/2008  . CHRONIC KIDNEY DISEASE STAGE II (MILD) 08/01/2008  . STRESS INCONTINENCE 07/31/2008  . Essential hypertension 04/16/2007    Past Medical History: Past Medical History  Diagnosis Date  . Hypertension   . Hypertension     Hypertensive urgency 05/2006  . Atrial fibrillation ([place])   . Hypercholesteremia   . 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 Diabetes mellitus     type 2    Past Surgical History: Past Surgical History  Procedure Laterality Date  . Tonsilectomy, adenoidectomy, bilateral myringotomy and tubes  age 40  . Dilation and curettage of uterus  1976  . Meniscus repair  03/09    right knee  . Tonsillectomy      Social History: Social History  Substance Use Topics  . Smoking status: Never Smoker   . Smokeless tobacco: Never Used  . Alcohol Use: No   Additional  social history Please also refer to relevant sections of EMR.  Family History: Family History  Problem Relation Age of Onset  . Heart failure Mother   . Diabetes Father   . Hypertension Father   . Lung cancer Sister     Allergies and Medications: Allergies  Allergen Reactions  . Propoxyphene N-Acetaminophen Hives, Itching, Swelling and Other (See Comments)    REACTION: Hallucinations   No current facility-administered medications on file prior to encounter.   Current Outpatient Prescriptions on File Prior to Encounter  Medication Sig Dispense Refill  . albuterol (PROVENTIL HFA;VENTOLIN HFA) 108 (90 Base) MCG/ACT inhaler Inhale 2 puffs into the lungs every 6 (six) hours as needed for wheezing or shortness of breath. 1 Inhaler 2  . amLODipine (NORVASC) 10 MG tablet Take 1 tablet (10 mg total) by mouth daily. 30 tablet 0  . cloNIDine (CATAPRES) 0.2 MG tablet Take 1 tablet (0.2 mg total) by mouth 2 (two) times daily. 60 tablet 0  . insulin aspart (NOVOLOG) 100 UNIT/ML injection Inject 20 Units into the skin 3 (three) times daily with meals. (Patient taking differently: Inject 20-25 Units into the skin 3 (three) times daily as needed for high blood sugar (CBG >150). ) 10 mL 2  . Insulin Glargine (LANTUS) 100 UNIT/ML Solostar Pen Inject 55 Units into the skin daily. (  Patient taking differently: Inject 55 Units into the skin at bedtime. ) 15 mL 1  . Liraglutide (VICTOZA) 18 MG/3ML SOPN Inject 0.3 mLs (1.8 mg total) into the skin daily. (Patient taking differently: Inject 1.8 mg into the skin at bedtime. ) 6 mL 2  . lisinopril (PRINIVIL,ZESTRIL) 40 MG tablet TAKE 1 TABLET(40 MG) BY MOUTH DAILY 30 tablet 0  . omeprazole (PRILOSEC) 20 MG capsule Take 1 capsule (20 mg total) by mouth 2 (two) times daily before a meal. 60 capsule 2  . rosuvastatin (CRESTOR) 10 MG tablet Take 1 tablet (10 mg total) by mouth daily. (Patient taking differently: Take 10 mg by mouth at bedtime. ) 30 tablet 1  .  clopidogrel (PLAVIX) 75 MG tablet Take 1 tablet (75 mg total) by mouth daily. (Patient not taking: Reported on 02/16/2016) 30 tablet 1  . nystatin (MYCOSTATIN) powder Apply topically 3 (three) times daily. Apply up to 3 times daily as needed. (Patient not taking: Reported on 02/16/2016) 30 g 2  . spironolactone (ALDACTONE) 25 MG tablet Take 1 tablet (25 mg total) by mouth daily. (Patient not taking: Reported on 02/16/2016) 30 tablet 2    Objective: BP 133/91 mmHg  Pulse 92  Temp(Src) 98.4 F (36.9 C) (Oral)  Resp 16  Ht 5\' 2"  (1.575 m)  Wt 259 lb 1.6 oz (117.527 kg)  BMI 47.38 kg/m2  SpO2 95% Exam: GEN:  appears obese, NAD Oropharynx: clear, moist CVS: regular rate and rythm, normal s1 and s2, no murmurs, no edema RESP: no increased work of breathing, no crackles or wheeze GI: normal bowel sound, soft, non-tender,non-distended MSK: no tenderness to palpation NEURO: alert and oriented x4, positive for mild aphasia, cranial nerves II-12 intact, motor 4 out of 5 in left upper and lower extremities, 5 out of 5 in the right. Sensation intact bilaterally. No pronator drift. Patellar reflex 2+ bilaterally.  PSYCH: A little bit tearful   Labs and Imaging: CBC BMET   Recent Labs Lab 02/16/16 1150  WBC 9.3  HGB 13.2  HCT 41.9  PLT 324    Recent Labs Lab 02/16/16 1150  NA 139  K 3.7  CL 107  CO2 23  BUN 15  CREATININE 1.27*  GLUCOSE 176*  CALCIUM 9.7     Dg Chest 2 View  02/16/2016  CLINICAL DATA:  Atrial fibrillation. EXAM: CHEST  2 VIEW COMPARISON:  12/10/2015 FINDINGS: The heart size and mediastinal contours are within normal limits. Both lungs are clear. The visualized skeletal structures are unremarkable. IMPRESSION: No active cardiopulmonary disease. Electronically Signed   By: Misty Stanley [name]   On: 02/16/2016 13:11   Mr Jodene Nam Head Wo Contrast  02/16/2016  CLINICAL DATA:  New onset left-sided weakness and slurred speech. Symptoms began 3 days ago. EXAM: MRI HEAD WITHOUT  CONTRAST MRA HEAD WITHOUT CONTRAST TECHNIQUE: Multiplanar, multiecho pulse sequences of the brain and surrounding structures were obtained without intravenous contrast. Angiographic images of the head were obtained using MRA technique without contrast. COMPARISON:  CT head without contrast 01/06/2016. MRI brain 06/02/2014 FINDINGS: MRI HEAD FINDINGS An 8 mm linear white matter nonhemorrhagic infarct is present within the right external capsule. No other acute infarct is present. The basal ganglia are otherwise intact. Insular ribbon is normal. Moderate diffuse periventricular and subcortical T2 changes are advanced for age. There are remote lacunar infarcts within the bowel my bilaterally. Dilated perivascular spaces are present throughout the basal ganglia. A remote right paramedian lacunar infarct is present in the midbrain.  There is a remote lacunar infarct of the right cerebellum. White matter changes extend into the brainstem as well. Flow is present in the major intracranial arteries. The globes and orbits are intact. Ethmoid air cells are opacified anteriorly, left greater than right. There is mild diffuse mucosal thickening in the right greater left sphenoid sinus. Remaining paranasal sinuses and the mastoid air cells are clear. MRA HEAD FINDINGS Internal carotid arteries are within normal limits from the high cervical segments through the ICA termini bilaterally. Posterior communicating arteries are present bilaterally. The A1 and M1 segments are within normal limits. A small anterior communicating artery is present. ACA and MCA branch vessels are within normal limits. The MCA bifurcations are intact bilaterally. Vertebral arteries are codominant. The PICA origins are visualized and normal. There is mild narrowing of the distal left vertebral artery proximal to the vertebrobasilar junction. Basilar artery is within normal limits. Fetal type posterior cerebral arteries are present bilaterally with small P1  segments, left greater than right. PCA branch vessels are within normal limits. IMPRESSION: 1. 8 mm acute nonhemorrhagic linear infarct within the right external capsule. 2. Moderate diffuse periventricular and subcortical T2 changes bilaterally reflect the sequela of chronic microvascular ischemia. White matter changes extend into the brainstem 3. Remote lacunar infarcts of the thalami bilaterally and right paramedian midbrain. 4. Mild sinus disease. 5. No significant stenosis within the anterior circulation. 6. Mild narrowing of the distal left vertebral artery without a significant stenosis relative to the more distal vessels. Electronically Signed   By: San Morelle [name]   On: 02/16/2016 16:38   Mr Brain Wo Contrast  02/16/2016  CLINICAL DATA:  New onset left-sided weakness and slurred speech. Symptoms began 3 days ago. EXAM: MRI HEAD WITHOUT CONTRAST MRA HEAD WITHOUT CONTRAST TECHNIQUE: Multiplanar, multiecho pulse sequences of the brain and surrounding structures were obtained without intravenous contrast. Angiographic images of the head were obtained using MRA technique without contrast. COMPARISON:  CT head without contrast 01/06/2016. MRI brain 06/02/2014 FINDINGS: MRI HEAD FINDINGS An 8 mm linear white matter nonhemorrhagic infarct is present within the right external capsule. No other acute infarct is present. The basal ganglia are otherwise intact. Insular ribbon is normal. Moderate diffuse periventricular and subcortical T2 changes are advanced for age. There are remote lacunar infarcts within the bowel my bilaterally. Dilated perivascular spaces are present throughout the basal ganglia. A remote right paramedian lacunar infarct is present in the midbrain. There is a remote lacunar infarct of the right cerebellum. White matter changes extend into the brainstem as well. Flow is present in the major intracranial arteries. The globes and orbits are intact. Ethmoid air cells are opacified  anteriorly, left greater than right. There is mild diffuse mucosal thickening in the right greater left sphenoid sinus. Remaining paranasal sinuses and the mastoid air cells are clear. MRA HEAD FINDINGS Internal carotid arteries are within normal limits from the high cervical segments through the ICA termini bilaterally. Posterior communicating arteries are present bilaterally. The A1 and M1 segments are within normal limits. A small anterior communicating artery is present. ACA and MCA branch vessels are within normal limits. The MCA bifurcations are intact bilaterally. Vertebral arteries are codominant. The PICA origins are visualized and normal. There is mild narrowing of the distal left vertebral artery proximal to the vertebrobasilar junction. Basilar artery is within normal limits. Fetal type posterior cerebral arteries are present bilaterally with small P1 segments, left greater than right. PCA branch vessels are within normal limits. IMPRESSION: 1.  8 mm acute nonhemorrhagic linear infarct within the right external capsule. 2. Moderate diffuse periventricular and subcortical T2 changes bilaterally reflect the sequela of chronic microvascular ischemia. White matter changes extend into the brainstem 3. Remote lacunar infarcts of the thalami bilaterally and right paramedian midbrain. 4. Mild sinus disease. 5. No significant stenosis within the anterior circulation. 6. Mild narrowing of the distal left vertebral artery without a significant stenosis relative to the more distal vessels. Electronically Signed   By: San Morelle [name]   On: 02/16/2016 16:38     Mercy Riding, MD 02/16/2016, 5:14 PM PGY-2, [place] Intern pager: [phone], text pages welcome

## 2016-02-16 NOTE — ED Notes (Signed)
Declined W/C at D/C and was escorted to lobby by RN. 

## 2016-02-16 NOTE — ED Notes (Signed)
Pt reports she does not take BP meds as recommended .

## 2016-02-16 NOTE — ED Notes (Signed)
PT in MRI scanner Dept . Unable to draw troponin now.

## 2016-02-16 NOTE — ED Notes (Signed)
RN to MRI scanner to give PT PO ativan  For anxiety.

## 2016-02-16 NOTE — Care Management Note (Signed)
Case Management Note  Patient Details  Name: RIVERLYN COYKENDALL MRN: [id number] Date of Birth: 07/20/1956  Subjective/Objective:   60 y.o. F seen in the ED today for palpitations. CM consulted for medication assistance for this pt as she was on Xarelto which can be quite pricey. Does not want to switch to Coumadin d/t need for INR levels. Will give coupon for Xarelto. Pt aware she needs to follow-up with PCP for medication follow up                  Action/Plan: Discharge.   Expected Discharge Date:                  Expected Discharge Plan:     In-House Referral:     Discharge planning Services  CM Consult, Medication Assistance  Post Acute Care Choice:    Choice offered to:     DME Arranged:    DME Agency:     HH Arranged:    HH Agency:     Status of Service:  Completed, signed off  If discussed at [employer] of Stay Meetings, dates discussed:    Additional Comments:  Delrae Sawyers, RN 02/16/2016, 2:28 PM

## 2016-02-16 NOTE — ED Notes (Signed)
Patient walked outside today and developed palpitations and heart racing, states that she goes in/out of atrial fib. On arrival alert and oriented and denies pain

## 2016-02-16 NOTE — ED Notes (Signed)
Attempted Report 

## 2016-02-16 NOTE — ED Provider Notes (Signed)
CSN: [id number]     Arrival date & time 02/16/16  1137 History   First MD Initiated Contact with Patient 02/16/16 1206     Chief Complaint  Patient presents with  . heart racing   . Palpitations     (Consider location/radiation/quality/duration/timing/severity/associated sxs/prior Treatment) HPI Comments: Patient reports episode of "racing heart" while she was standing at rest associated with diaphoresis and palpitations. Denies chest pain, near syncope, cough or fever. After about 20 minutes, the symptoms resolved. She now feels back to baseline. She is concerned it was an episode of atrial fibrillation. She has had atrial fibrillation before. She is not compliant with her anticoagulation and has not had xarelto in 6 months due to cost. She also has been out of her coreg for several months. She states compliance with her other blood pressure medications and insulin. She feels back to baseline now. Her daughter at bedside feels that her speech might be a little bit slurred compared to yesterday. Had a stroke previously with residual right-sided weakness.  The history is provided by the patient and a relative.    Past Medical History  Diagnosis Date  . Hypertension   . Hypertension     Hypertensive urgency 05/2006  . Atrial fibrillation ([place])   . Hypercholesteremia   . 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 Diabetes mellitus     type 2   Past Surgical History  Procedure Laterality Date  . Tonsilectomy, adenoidectomy, bilateral myringotomy and tubes  age 17  . Dilation and curettage of uterus  1976  . Meniscus repair  03/09    right knee  . Tonsillectomy     Family History  Problem Relation Age of Onset  . Heart failure Mother   . Diabetes Father   . Hypertension Father   . Lung cancer Sister    Social History  Substance Use Topics  . Smoking status: Never Smoker   . Smokeless tobacco: Never Used  . Alcohol Use: No   OB History    No data available     Review of Systems  Constitutional: Negative for fever, activity change and appetite change.  HENT: Negative for congestion.   Respiratory: Positive for shortness of breath. Negative for cough.   Cardiovascular: Positive for palpitations. Negative for chest pain.  Gastrointestinal: Negative for nausea, vomiting and abdominal pain.  Genitourinary: Negative for dysuria, hematuria, vaginal bleeding and vaginal discharge.  Musculoskeletal: Negative for myalgias and arthralgias.  Skin: Negative for rash.  Neurological: Negative for dizziness and weakness.  A complete 10 system review of systems was obtained and all systems are negative except as noted in the HPI and PMH.      Allergies  Propoxyphene n-acetaminophen  Home Medications   Prior to Admission medications   Medication Sig Start Date End Date Taking? Authorizing Provider  albuterol (PROVENTIL HFA;VENTOLIN HFA) 108 (90 Base) MCG/ACT inhaler Inhale 2 puffs into the lungs every 6 (six) hours as needed for wheezing or shortness of breath. 12/10/15   Fredia Sorrow, MD  amLODipine (NORVASC) 10 MG tablet Take 1 tablet (10 mg total) by mouth daily. 01/06/16   Carmin Muskrat, MD  carvedilol (COREG) 25 MG tablet Take 1 tablet (25 mg total) by mouth 2 (two) times daily with a meal. 01/06/16   Carmin Muskrat, MD  cloNIDine (CATAPRES) 0.2 MG tablet Take 1 tablet (0.2 mg total) by mouth 2 (two) times daily. 01/06/16   Carmin Muskrat, MD  clopidogrel (PLAVIX) 75 MG tablet Take 1 tablet (75 mg total) by mouth daily. 05/10/15   Verner Mould, MD  insulin aspart (NOVOLOG) 100 UNIT/ML injection Inject 20 Units into the skin 3 (three) times daily with meals. Patient taking differently: Inject 20-25 Units into the skin 3 (three) times daily as needed for high blood sugar (CBG >150).  06/11/15   Verner Mould, MD  Insulin Glargine (LANTUS) 100 UNIT/ML Solostar Pen Inject 55 Units into the skin daily. Patient  taking differently: Inject 55 Units into the skin at bedtime.  06/11/15   Verner Mould, MD  Liraglutide (VICTOZA) 18 MG/3ML SOPN Inject 0.3 mLs (1.8 mg total) into the skin daily. Patient taking differently: Inject 1.8 mg into the skin at bedtime.  06/11/15   Verner Mould, MD  lisinopril (PRINIVIL,ZESTRIL) 40 MG tablet TAKE 1 TABLET(40 MG) BY MOUTH DAILY 01/06/16   Carmin Muskrat, MD  nystatin (MYCOSTATIN) powder Apply topically 3 (three) times daily. Apply up to 3 times daily as needed. Patient taking differently: Apply 1 g topically 3 (three) times daily as needed (rash under breasts).  05/10/15   Verner Mould, MD  omeprazole (PRILOSEC) 20 MG capsule Take 1 capsule (20 mg total) by mouth 2 (two) times daily before a meal. 06/11/15   Verner Mould, MD  rosuvastatin (CRESTOR) 10 MG tablet Take 1 tablet (10 mg total) by mouth daily. Patient taking differently: Take 10 mg by mouth at bedtime.  05/10/15   Verner Mould, MD  spironolactone (ALDACTONE) 25 MG tablet Take 1 tablet (25 mg total) by mouth daily. 06/11/15   Verner Mould, MD   BP 187/113 mmHg  Pulse 91  Temp(Src) 98 F (36.7 C) (Oral)  Resp 18  Ht 5\' 2"  (1.575 m)  Wt 259 lb 1.6 oz (117.527 kg)  BMI 47.38 kg/m2  SpO2 98% Physical Exam  Constitutional: She is oriented to person, place, and time. She appears well-developed and well-nourished. No distress.  HENT:  Head: Normocephalic and atraumatic.  Mouth/Throat: Oropharynx is clear and moist. No oropharyngeal exudate.  Eyes: Conjunctivae and EOM are normal. Pupils are equal, round, and reactive to light.  Neck: Normal range of motion. Neck supple.  No meningismus.  Cardiovascular: Normal rate, regular rhythm, normal heart sounds and intact distal pulses.   No murmur heard. Pulmonary/Chest: Effort normal and breath sounds normal. No respiratory distress.  Abdominal: Soft. There is no tenderness. There is no rebound and  no guarding.  Musculoskeletal: Normal range of motion. She exhibits no edema or tenderness.  Neurological: She is alert and oriented to person, place, and time. No cranial nerve deficit. She exhibits normal muscle tone. Coordination normal.  No dysarthria. No ataxia on finger to nose bilaterally. LEft pronator drift. Slightly decreased on LUE compared to R.  CN 2-12 intact.Equal grip strength. Sensation intact.   Skin: Skin is warm.  Psychiatric: She has a normal mood and affect. Her behavior is normal.  Nursing note and vitals reviewed.   ED Course  Procedures (including critical care time) Labs Review Labs Reviewed  BASIC METABOLIC PANEL - Abnormal; Notable for the following:    Glucose, Bld 176 (*)    Creatinine, Ser 1.27 (*)    GFR calc non Af Amer 45 (*)    GFR calc Af Amer 52 (*)    All other components within normal limits  CBC - Abnormal; Notable for the following:    RBC 5.83 (*)    MCV 71.9 (*)  MCH 22.6 (*)    All other components within normal limits  TROPONIN I    Imaging Review No results found. I have personally reviewed and evaluated these images and lab results as part of my medical decision-making.   EKG Interpretation   Date/Time:  Saturday February 16 2016 11:42:52 EDT Ventricular Rate:  102 PR Interval:  148 QRS Duration: 82 QT Interval:  340 QTC Calculation: 443 R Axis:   14 Text Interpretation:  Sinus tachycardia Nonspecific T wave abnormality  Abnormal ECG Nonspecific T wave abnormality Confirmed by Wyvonnia Dusky  MD,  Ambrose Wile [phone]) on 02/16/2016 12:02:51 PM      MDM   Final diagnoses:  None  Racing heart episode, now resolved.  NSR on arrival. No chest pain or SOB.  Did have diaphoresis and nausea.  Feels back to baseline now.  Seems to have L sided weakness, daughter feels speech is slurred as well.  LSN yesterday. Labs unremarkable, troponin negative.  Case manager involved for medication assistance. Home BP meds given since she didn't take  them today. MRI obtained to evaluate for stroke with new L sided weakness and possible slurred speech.  MRI tech states positive for infarct, will add MRA.  D/w Dr. Tasia Catchings of neurology who will see.  Will need admission.  Dr Roderic Palau will assume care and admit for CVA once MRI read is back.  Second troponin pending.      Ezequiel Essex, MD 02/16/16 [phone]

## 2016-02-17 ENCOUNTER — Inpatient Hospital Stay (HOSPITAL_COMMUNITY): Payer: Medicare Other

## 2016-02-17 DIAGNOSIS — N760 Acute vaginitis: Secondary | ICD-10-CM | POA: Insufficient documentation

## 2016-02-17 DIAGNOSIS — I639 Cerebral infarction, unspecified: Principal | ICD-10-CM

## 2016-02-17 DIAGNOSIS — M6289 Other specified disorders of muscle: Secondary | ICD-10-CM

## 2016-02-17 DIAGNOSIS — B9689 Other specified bacterial agents as the cause of diseases classified elsewhere: Secondary | ICD-10-CM | POA: Insufficient documentation

## 2016-02-17 DIAGNOSIS — E0800 Diabetes mellitus due to underlying condition with hyperosmolarity without nonketotic hyperglycemic-hyperosmolar coma (NKHHC): Secondary | ICD-10-CM

## 2016-02-17 DIAGNOSIS — Z794 Long term (current) use of insulin: Secondary | ICD-10-CM

## 2016-02-17 LAB — GLUCOSE, CAPILLARY
GLUCOSE-CAPILLARY: 117 mg/dL — AB (ref 65–99)
Glucose-Capillary: 214 mg/dL — ABNORMAL HIGH (ref 65–99)
Glucose-Capillary: 124 mg/dL — ABNORMAL HIGH (ref 65–99)
Glucose-Capillary: 152 mg/dL — ABNORMAL HIGH (ref 65–99)

## 2016-02-17 LAB — BASIC METABOLIC PANEL
Anion gap: 6 (ref 5–15)
BUN: 12 mg/dL (ref 6–20)
CHLORIDE: 109 mmol/L (ref 101–111)
CO2: 24 mmol/L (ref 22–32)
CREATININE: 0.79 mg/dL (ref 0.44–1.00)
Calcium: 8.7 mg/dL — ABNORMAL LOW (ref 8.9–10.3)
GFR calc Af Amer: >60 mL/min (ref >60)
GFR calc non Af Amer: >60 mL/min (ref >60)
GLUCOSE: 125 mg/dL — AB (ref 65–99)
Potassium: 3.9 mmol/L (ref 3.5–5.1)
Sodium: 139 mmol/L (ref 135–145)

## 2016-02-17 LAB — LIPID PANEL
CHOLESTEROL: 145 mg/dL (ref 0–200)
HDL: 32 mg/dL — ABNORMAL LOW (ref >40)
LDL CALC: 88 mg/dL (ref 0–99)
Total CHOL/HDL Ratio: 4.5 RATIO
Triglycerides: 123 mg/dL (ref <150)
VLDL: 25 mg/dL (ref 0–40)

## 2016-02-17 LAB — TSH: TSH: 1.862 u[IU]/mL (ref 0.350–4.500)

## 2016-02-17 LAB — MRSA PCR SCREENING: MRSA by PCR: NEGATIVE

## 2016-02-17 MED ORDER — ENOXAPARIN SODIUM 40 MG/0.4ML ~~LOC~~ SOLN: 40.0000 mg | SUBCUTANEOUS | Status: DC

## 2016-02-17 MED ORDER — METRONIDAZOLE 500 MG PO TABS
500.0000 mg | ORAL_TABLET | Freq: Two times a day (BID) | ORAL | Status: DC
  Administered 2016-02-17 – 2016-02-18 (×3): 500 mg via ORAL
  Filled 2016-02-17 (×3): qty 1

## 2016-02-17 MED ORDER — IBUPROFEN 200 MG PO TABS: 400.0000 mg | ORAL_TABLET | Freq: Once | ORAL | Status: DC

## 2016-02-17 MED ORDER — CLONIDINE HCL 0.1 MG PO TABS: 0.1000 mg | ORAL_TABLET | Freq: Three times a day (TID) | ORAL | Status: DC

## 2016-02-17 MED ORDER — LABETALOL HCL 5 MG/ML IV SOLN: 10.0000 mg | INTRAVENOUS | Status: DC | PRN

## 2016-02-17 MED ORDER — CLONIDINE HCL 0.2 MG PO TABS
0.2000 mg | ORAL_TABLET | Freq: Two times a day (BID) | ORAL | Status: DC
Start: 2016-02-17 — End: 2016-02-18
  Administered 2016-02-17 – 2016-02-18 (×3): 0.2 mg via ORAL
  Filled 2016-02-17 (×3): qty 1

## 2016-02-17 MED ORDER — CLOTRIMAZOLE 2 % VA CREA
1.0000 | TOPICAL_CREAM | Freq: Every day | VAGINAL | Status: DC
  Administered 2016-02-17: 1 via VAGINAL
  Filled 2016-02-17: qty 21

## 2016-02-17 MED ORDER — CHOLINE-MAG TRISALICYLATE 500 MG/5ML PO LIQD: 500.0000 mg | Freq: Three times a day (TID) | ORAL | Status: DC | PRN

## 2016-02-17 MED ORDER — RIVAROXABAN 20 MG PO TABS
20.0000 mg | ORAL_TABLET | Freq: Every day | ORAL | Status: DC
  Administered 2016-02-17: 20 mg via ORAL
  Filled 2016-02-17: qty 1

## 2016-02-17 MED ORDER — OXYCODONE HCL 5 MG PO TABS
2.5000 mg | ORAL_TABLET | Freq: Four times a day (QID) | ORAL | Status: DC | PRN
  Administered 2016-02-17: 2.5 mg via ORAL
  Filled 2016-02-17: qty 1

## 2016-02-17 NOTE — Evaluation (Signed)
Physical Therapy Evaluation Patient Details Name: [name] MRN: [id number] DOB: August 16, 1955 Today's Date: 02/17/2016   History of Present Illness  pt is a 60 y/o female with h/o stroke, DM, HTN, afib, depression, admitted with stroke.  MRI showed 24mm acut infarct in the Right external capsule.  Clinical Impression  Pt admitted with/for s/s of stroke with L sided weakness.  Pt currently limited functionally due to the problems listed below.  (see problems list.)  Pt will benefit from PT to maximize function and safety to be able to get home safely with available assist.     Follow Up Recommendations Home health PT    Equipment Recommendations  None recommended by PT    Recommendations for Other Services       Precautions / Restrictions Precautions Precautions: Fall      Mobility  Bed Mobility Overal bed mobility: Needs Assistance Bed Mobility: Supine to Sit     Supine to sit: Supervision     General bed mobility comments: effortful, but no need for assist.  Transfers Overall transfer level: Needs assistance Equipment used: Rolling walker (2 wheeled);None Transfers: Sit to/from [employer] to Stand: Min guard Stand pivot transfers: Min guard       General transfer comment: cues for hand placement  Ambulation/Gait Ambulation/Gait assistance: Min guard Ambulation Distance (Feet): 250 Feet Assistive device: Rolling walker (2 wheeled) Gait Pattern/deviations: Step-through pattern   Gait velocity interpretation: Below normal speed for age/gender General Gait Details: generally steady, improving with distance.  Stairs            Wheelchair Mobility    Modified Rankin (Stroke Patients Only) Modified Rankin (Stroke Patients Only) Pre-Morbid Rankin Score: Slight disability Modified Rankin: Moderate disability     Balance Overall balance assessment: Needs assistance Sitting-balance support: No upper extremity supported Sitting  balance-Leahy Scale: Good     Standing balance support: No upper extremity supported;Bilateral upper extremity supported Standing balance-Leahy Scale: Fair                               Pertinent Vitals/Pain Pain Assessment: Faces Faces Pain Scale: No hurt    Home Living Family/patient expects to be discharged to:: Private residence Living Arrangements: Other relatives (both pt and housemate are handicapped) Available Help at Discharge: Family;Available PRN/intermittently Type of Home: House Home Access: Stairs to enter Entrance Stairs-Rails: Psychiatric nurse of Steps: 3 Home Layout: One level Home Equipment: Walker - 4 wheels;Tub bench;Cane - single point      Prior Function Level of Independence: Independent with assistive device(s) (in the home,  needs assist other than basic ADL's)               Hand Dominance        Extremity/Trunk Assessment   Upper Extremity Assessment: Defer to OT evaluation (L mildly weak)           Lower Extremity Assessment: Overall WFL for tasks assessed;RLE deficits/detail;LLE deficits/detail RLE Deficits / Details: mild proximal weakness LLE Deficits / Details: mild proximal weakness, quads 4/5     Communication   Communication: No difficulties  Cognition Arousal/Alertness: Awake/alert Behavior During Therapy: WFL for tasks assessed/performed Overall Cognitive Status: Within Functional Limits for tasks assessed                      General Comments      Exercises  Assessment/Plan    PT Assessment Patient needs continued PT services  PT Diagnosis Abnormality of gait;Generalized weakness (mild hemiparesis)   PT Problem List Decreased strength;Decreased activity tolerance;Decreased balance;Decreased mobility;Decreased coordination;Decreased knowledge of use of DME  PT Treatment Interventions DME instruction;Gait training;Stair training;Functional mobility training;Therapeutic  activities;Balance training;Neuromuscular re-education;Patient/family education   PT Goals (Current goals can be found in the Care Plan section) Acute Rehab PT Goals Patient Stated Goal: be able to do for myself. PT Goal Formulation: With patient Time For Goal Achievement: 02/24/16 Potential to Achieve Goals: Good    Frequency Min 3X/week   Barriers to discharge Decreased caregiver support      Co-evaluation               End of Session   Activity Tolerance: Patient tolerated treatment well Patient left: in chair;with call bell/phone within reach;with nursing/sitter in room;with family/visitor present Nurse Communication: Mobility status         Time: [id number] PT Time Calculation (min) (ACUTE ONLY): 34 min   Charges:   PT Evaluation $PT Eval Moderate Complexity: 1 Procedure PT Treatments $Gait Training: 8-22 mins   PT G Codes:        Maveric Debono, Tessie Fass 02/17/2016, 11:36 AM  02/17/2016  Donnella Sham, PT [phone]  (pager)

## 2016-02-17 NOTE — Progress Notes (Signed)
VASCULAR LAB PRELIMINARY  PRELIMINARY  PRELIMINARY  PRELIMINARY  Carotid duplex has been completed.    Preliminary report:  Bilateral:  No evidence of ICA stenosis.  Vertebral artery flow is antegrade.     Channing Savich, RVT, RDMS 02/17/2016, 10:06 AM

## 2016-02-17 NOTE — Evaluation (Signed)
Clinical/Bedside Swallow Evaluation Patient Details  Name: [name] MRN: [id number] Date of Birth: April 07, 1956  Today's Date: 02/17/2016 Time: SLP Start Time (ACUTE ONLY): 1022 SLP Stop Time (ACUTE ONLY): 1036 SLP Time Calculation (min) (ACUTE ONLY): 14 min  Past Medical History:  Past Medical History  Diagnosis Date  . Hypertension   . Hypertension     Hypertensive urgency 05/2006  . Atrial fibrillation ([place])   . Hypercholesteremia   . 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 Diabetes mellitus     type 2   Past Surgical History:  Past Surgical History  Procedure Laterality Date  . Tonsilectomy, adenoidectomy, bilateral myringotomy and tubes  age 14  . Dilation and curettage of uterus  1976  . Meniscus repair  03/09    right knee  . Tonsillectomy     HPI:  JOANY HASLETT is a 60 y.o. female presenting with stroke. PMH is significant for stroke, diabetes, hypertension, atrial fibrillation, depression, obesity. MRI/MRA brain with an 8 mm acute nonhemorrhagic linear infarct within the right external capsule and some chronic microvascular changes   Assessment / Plan / Recommendation Clinical Impression  Pt presents with mild oral dysphagia secondary to limited dentition. Oral motor exam appeared unremarkable though family reports prior left sided facial droop.  No overt signs or symptoms of aspiration this date with any consistencies, though pt reports intermittent episodic difficulty prior to CVA. Pt with consistent facial grimacing during swallow, though denies pain. Pt with prolonged mastication of solid PO. Recommend dysphagia 3 (mechanical soft) consistencies with thin liquids and medicines whole. ST to monitor for diet tolerance. Pts daughter present who reports changes in pts speech. SLP noted slowed processing. Notified RN of pt needs for cognitive linguistic evaluation. SLP to assess as soon as possible.     Aspiration Risk  Mild  aspiration risk    Diet Recommendation   Dysphagia 3 (mechanical soft) thin liquids   Medication Administration: Whole meds with liquid    Other  Recommendations Oral Care Recommendations: Oral care BID   Follow up Recommendations  24 hour supervision/assistance    Frequency and Duration min 2x/week  1 week       Prognosis Prognosis for Safe Diet Advancement: Fair Barriers to Reach Goals: Severity of deficits      Swallow Study   General Date of Onset: 02/16/16 HPI: CHAELYN MONTOY is a 60 y.o. female presenting with stroke. PMH is significant for stroke, diabetes, hypertension, atrial fibrillation, depression, obesity. MRI/MRA brain with an 8 mm acute nonhemorrhagic linear infarct within the right external capsule and some chronic microvascular changes Type of Study: Bedside Swallow Evaluation Previous Swallow Assessment: none Diet Prior to this Study: Regular;Thin liquids Temperature Spikes Noted: No Respiratory Status: Room air History of Recent Intubation: No Behavior/Cognition: Alert;Other (Comment) (disoriented ) Oral Cavity - Dentition: Other (Comment);Missing dentition (edentulous upper oral cavity, ) Vision: Functional for self-feeding Self-Feeding Abilities: Able to feed self Patient Positioning: Upright in bed Baseline Vocal Quality: Low vocal intensity Volitional Cough: Strong Volitional Swallow: Able to elicit    Oral/Motor/Sensory Function Overall Oral Motor/Sensory Function: Within functional limits   Ice Chips Ice chips: Within functional limits   Thin Liquid Thin Liquid: Within functional limits    Nectar Thick Nectar Thick Liquid: Not tested   Honey Thick Honey Thick Liquid: Not tested   Puree Puree: Within functional limits   Solid   GO   Solid: Impaired Presentation:  Self Fed Oral Phase Impairments: Impaired mastication Oral Phase Functional Implications: Prolonged oral transit;Impaired mastication Pharyngeal Phase Impairments: Multiple  swallows       Arvil Chaco MA, CCC-SLP Acute Care Speech Language Pathologist    Arvil Chaco E 02/17/2016,10:54 AM

## 2016-02-17 NOTE — Progress Notes (Signed)
ANTICOAGULATION CONSULT NOTE - Initial Consult  Pharmacy Consult for Xarelto Indication: atrial fibrillation  Allergies  Allergen Reactions  . Propoxyphene N-Acetaminophen Hives, Itching, Swelling and Other (See Comments)    REACTION: Hallucinations    Patient Measurements: Height: 5\' 3"  (160 cm) Weight: 253 lb 15.5 oz (115.2 kg) IBW/kg (Calculated) : 52.4  Vital Signs: Temp: 98.5 F (36.9 C) (07/16 0528) Temp Source: Oral (07/16 0528) BP: 145/86 mmHg (07/16 0528) Pulse Rate: 80 (07/16 0528)  Labs:  Recent Labs  02/16/16 1150 02/16/16 1626  HGB 13.2  --   HCT 41.9  --   PLT 324  --   CREATININE 1.27*  --   TROPONINI <0.03 <0.03    Estimated Creatinine Clearance: 56.9 mL/min (by C-G formula based on Cr of 1.27).   Medical History: Past Medical History  Diagnosis Date  . Hypertension   . Hypertension     Hypertensive urgency 05/2006  . Atrial fibrillation ([place])   . Hypercholesteremia   . 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 Diabetes mellitus     type 2    Medications:  Prescriptions prior to admission  Medication Sig Dispense Refill Last Dose  . albuterol (PROVENTIL HFA;VENTOLIN HFA) 108 (90 Base) MCG/ACT inhaler Inhale 2 puffs into the lungs every 6 (six) hours as needed for wheezing or shortness of breath. 1 Inhaler 2 rescue at rescue  . amLODipine (NORVASC) 10 MG tablet Take 1 tablet (10 mg total) by mouth daily. 30 tablet 0 02/15/2016 at Unknown time  . cloNIDine (CATAPRES) 0.2 MG tablet Take 1 tablet (0.2 mg total) by mouth 2 (two) times daily. 60 tablet 0 02/15/2016 at Unknown time  . insulin aspart (NOVOLOG) 100 UNIT/ML injection Inject 20 Units into the skin 3 (three) times daily with meals. (Patient taking differently: Inject 20-25 Units into the skin 3 (three) times daily as needed for high blood sugar (CBG >150). ) 10 mL 2 02/15/2016 at Unknown time  . Insulin Glargine (LANTUS) 100 UNIT/ML Solostar Pen Inject 55  Units into the skin daily. (Patient taking differently: Inject 55 Units into the skin at bedtime. ) 15 mL 1 02/15/2016 at Unknown time  . Liraglutide (VICTOZA) 18 MG/3ML SOPN Inject 0.3 mLs (1.8 mg total) into the skin daily. (Patient taking differently: Inject 1.8 mg into the skin at bedtime. ) 6 mL 2 02/15/2016 at Unknown time  . lisinopril (PRINIVIL,ZESTRIL) 40 MG tablet TAKE 1 TABLET(40 MG) BY MOUTH DAILY 30 tablet 0 Past Week at Unknown time  . omeprazole (PRILOSEC) 20 MG capsule Take 1 capsule (20 mg total) by mouth 2 (two) times daily before a meal. 60 capsule 2 Past Month at Unknown time  . rosuvastatin (CRESTOR) 10 MG tablet Take 1 tablet (10 mg total) by mouth daily. (Patient taking differently: Take 10 mg by mouth at bedtime. ) 30 tablet 1 Past Month at Unknown time  . spironolactone (ALDACTONE) 25 MG tablet Take 1 tablet (25 mg total) by mouth daily. (Patient not taking: Reported on 02/16/2016) 30 tablet 2 Not Taking at Unknown time    Assessment: 60 y.o. F presents with CVA. Noted pt with h/o afib (CHAD-Vasc score 4-5) but was having trouble affording Xarelto so has not been receiving anticoagulation. CBC stable. SCr 1.27, est CrCl 56 ml/min.  Goal of Therapy:  Prevention of CVA Monitor platelets by anticoagulation protocol: Yes   Plan:  Xarelto 20mg  po daily with supper - ok to give first  dose now Will f/u SCr and s/s bleeding Case management consult for new Xarelto  Sherlon Handing, PharmD, BCPS Clinical pharmacist, pager [phone] 02/17/2016,6:24 AM

## 2016-02-17 NOTE — Progress Notes (Signed)
STROKE TEAM PROGRESS NOTE   HISTORY OF PRESENT ILLNESS (per record) [name] is a lovely 60 year old patient she presents today with a history of palpitations however there is suspicion of a possible mild left pronator drift during her evaluation. Given this finding but he underwent MRI of the brain or in the MRI of the brain has been reviewed reveals a new small acute cerebral infarction in the right subcortical semi-ovale white matter. There is evidence of an old infarct in the right pontine region there is also significant microvascular ischemic change several other small likely subclinical infarcts are also noted however these appear chronic in nature.  [name] does not appreciate any focal neurological deficits she reports no cranial nerve symptoms there is no dysphagia dysarthria or diplopia she reports no incoordination she's describing no changes in her vision.  [name] had been prescribed Xarelto given the fact that she had a prior ischemic event and has a known history of atrial fibrillation. Unfortunately there was an issue with the cost of the medication she has not been on any prophylaxis for stroke due to that issue.   SUBJECTIVE (INTERVAL HISTORY) Her daughter is at the bedside.  Overall she feels her condition is unchanged. She states feeling exhausted.  She denies feeling palpitations at this time.  She complained of chronic vaginal discharge with odor and itching.  Otherwise ROS was negative.  She reports losing approximately 30 pounds since 07/2015 with efforts to watch what she eats.  She admits drinking many diet cokes a day.  Her daughter estimated 1-2 6-packs of diet coke a day.  We discussed the risks of too much caffeine as well as stroke risk factors   OBJECTIVE Temp:  [97.5 F (36.4 C)-98.5 F (36.9 C)] 97.7 F (36.5 C) (07/16 0802) Pulse Rate:  [80-105] 80 (07/16 0802) Cardiac Rhythm:  [-] Normal sinus rhythm (07/15 2100) Resp:  [16-29] 18 (07/16 0528) BP:  (109-195)/(69-125) 152/94 mmHg (07/16 0802) SpO2:  [92 %-100 %] 99 % (07/16 0802) Weight:  [115.2 kg (253 lb 15.5 oz)-117.527 kg (259 lb 1.6 oz)] 115.2 kg (253 lb 15.5 oz) (07/15 2055)  CBC:  Recent Labs Lab 02/16/16 1150  WBC 9.3  HGB 13.2  HCT 41.9  MCV 71.9*  PLT 0000000    Basic Metabolic Panel:  Recent Labs Lab 02/16/16 1150 02/17/16 0639  NA 139 139  K 3.7 3.9  CL 107 109  CO2 23 24  GLUCOSE 176* 125*  BUN 15 12  CREATININE 1.27* 0.79  CALCIUM 9.7 8.7*    Lipid Panel:    Component Value Date/Time   CHOL 145 02/17/2016 0307   TRIG 123 02/17/2016 0307   HDL 32* 02/17/2016 0307   CHOLHDL 4.5 02/17/2016 0307   VLDL 25 02/17/2016 0307   LDLCALC 88 02/17/2016 0307   HgbA1c:  Lab Results  Component Value Date   HGBA1C 8.7 06/11/2015   Urine Drug Screen:    Component Value Date/Time   LABOPIA NONE DETECTED 06/02/2014 1104   COCAINSCRNUR NONE DETECTED 06/02/2014 1104   LABBENZ NONE DETECTED 06/02/2014 1104   AMPHETMU NONE DETECTED 06/02/2014 1104   THCU NONE DETECTED 06/02/2014 1104   LABBARB NONE DETECTED 06/02/2014 1104      IMAGING  Dg Chest 2 View 02/16/2016   No active cardiopulmonary disease. Electronically Signed   By: Misty Stanley [name]   On: 02/16/2016 13:11   Mr Jodene Nam Head Wo Contrast 02/16/2016   1. 8 mm acute nonhemorrhagic linear infarct within the right  external capsule.  2. Moderate diffuse periventricular and subcortical T2 changes bilaterally reflect the sequela of chronic microvascular ischemia. White matter changes extend into the brainstem  3. Remote lacunar infarcts of the thalami bilaterally and right paramedian midbrain.  4. Mild sinus disease.  5. No significant stenosis within the anterior circulation.  6. Mild narrowing of the distal left vertebral artery without a significant stenosis relative to the more distal vessels.   PHYSICAL EXAM HEENT- Normocephalic, no lesions, without obvious abnormality. Normal external eye and  conjunctiva.Normal external nose, mucus membranes and septum. Normal pharynx.  Cardiovascular- irregularly irregular rhythm, pulses palpable throughout   Lungs- chest clear, no wheezing, rales, normal symmetric air entry  Abdomen- soft, non-tender; bowel sounds normal; no masses, no organomegaly  Extremities- less then 2 second capillary refill  Neurological Examination Mental Status: Alert, oriented, thought content appropriate. Speech fluent without evidence of aphasia. Able to follow 3 step commands without difficulty. Cranial Nerves: II: Visual fields grossly normal, pupils equal, round, reactive to light and accommodation III,IV, VI: ptosis not present, extra-ocular motions intact bilaterally V,VII: smile symmetric, facial light touch sensation normal bilaterally VIII: hearing normal bilaterally IX,X: uvula rises symmetrically XI: bilateral shoulder shrug XII: midline tongue extension Motor: Right :Upper extremity 5/5Left: Upper extremity 4+/5 Lower extremity 5/5Lower extremity 5/5 Tone and bulk:normal tone throughout; no atrophy noted Sensory: light touch intact throughout, bilaterally Cerebellar: normal finger-to-nose Gait: deferred   ASSESSMENT/PLAN [name] is a 60 y.o. female with history of atrial fibrillation (patient stopped Xarelto secondary to cost), hypertension, hyperlipidemia, previous strokes, and diabetes mellitus presenting with palpitations and mild left pronator drift. She did not receive IV t-PA due to unknown time of onset.   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   Resultant  Left sided weakness that is resolving  MRI - 8 mm acute nonhemorrhagic linear infarct within the right external capsule with remote lacunar infarcts.   MRA - no high-grade stenosis.  Carotid Doppler - pending   2D  Echo - pending  LDL - 88  HgbA1c pending  VTE prophylaxis - Xarelto  Diet Heart Room service appropriate?: Yes; Fluid consistency:: Thin  No antithrombotic prior to admission, now on Xarelto (rivaroxaban) daily  Patient counseled to be compliant with her antithrombotic medications  Ongoing aggressive stroke risk factor management  Therapy recommendations:  pending   Disposition: Pending  Hypertension  Stable Permissive hypertension (OK if < 220/120) but gradually normalize in 5-7 days Long-term BP goal normotensive  Hyperlipidemia  Home meds: Crestor 10 mg daily resumed in hospital  LDL 88, goal < 70  Now on Lipitor 80 mg daily   Continue statin at discharge  Diabetes  HgbA1c pending, goal < 7.0  Unc/Controlled: status pending  Other Stroke Risk Factors  Advanced age  Obesity, Body mass index is 45 kg/(m^2)., recommend weight loss, diet and exercise as appropriate   Hx stroke/TIA  Atrial fibrillation  Other Active Problems  Medical noncompliance secondary to financial concerns.  Hospital day # 1  ATTENDING NOTE: Patient was seen and examined by me personally. Documentation reflects findings. The laboratory and radiographic studies reviewed by me. ROS completed by me personally and pertinent positives fully documented  Condition:  unchanged  Assessment and plan completed by me personally and fully documented above. Plans/Recommendations include:     Stroke risk factor work-up ongoing.  Patient with known afib.  Will need Case Management assistance beyond the free 30-day supply programs for NOAC if we hope  for patient compliance.  I discussed with patient the added benefits of NOACs over coumadin but explained that some opt for coumadin if finances are prohibitive  Dietary education needed  Ordered Diflucan and Monistat   SIGNED BY: Dr. Elissa Hefty      To contact Stroke Continuity provider, please refer to [email or web address] After hours,  contact General Neurology

## 2016-02-17 NOTE — Progress Notes (Signed)
Family Medicine Teaching Service Daily Progress Note Intern Pager: [phone]  Patient name: [name] Medical record number: [id number] Date of birth: [birth year] Age: 60 y.o. Gender: female  Primary Care Provider: Adin Hector, MD Consultants: neurology Code Status: full  Pt Overview and Major Events to Date:  7/15: admitted with right IC infarct  Assessment and Plan: [name] is a 60 y.o. female presenting with stroke. PMH is significant for stroke, diabetes, hypertension, atrial fibrillation, depression, obesity  Right IC CVA: improved. CT head negative for hemorrhagic process. MRI/MRA brain with an 8 mm acute nonhemorrhagic linear infarct within the right external capsule and some chronic microvascular changes. Neuro exam within normal limit this morning.  ASCVD risk score 23%.  -Neuro consulted and appreciate Rex -Stroke work up: -Carotid doppler (if no CTA) -Echo -f/u A1c and TSH -UDS -Neurovascular check q4hrs -Cardiac monitor -PT/OT/SLP -Fall precaution  Hypertension: Hypertensive to 190s over 120s in ED. Was given a home blood pressure medication in ED. Mostly normotensive since then.  -resume home clonidine today -will resume other blood pressure meds slowly  Diabetes: On Lantus 55 units, NovoLog 20-25 units 3 times a day with meals and Victoza 1.8 at bedtime at home. BMP sugar 176. No A1c in chart. -Lantus 20 units at night -SSI intermediates -A1c  Atrial fibrillation without RVR: Not on any medication for this mainly due to cost issue. CHAD-Vasc score 4-5. She is in sinus rhythm now.  -Start Xarelto today -Troponin negative 2 -Discuss about rate control  AKI: resolved. Creatinine 0.79 (1.27 on admission). Baseline 0.9-1.  -Saline lock  Depression: She was a little bit tearful during interview. Otherwise is stable. Not on any medication.  Social: Patient was not able to afford her  medications -Consult care management for assistance with his medications  FEN/GI:  -Saline lock -Carb modified diet. Passed bedside swallow test  Prophylaxis:  -On Xarelto  Disposion: regular floor  Subjective:  Reports mild bitemporal headache similar to her usual headache. She says the pain in mild. Denies vision changes, weakness or numbness. Has no other complaints.   Objective: Temp:  [97.5 F (36.4 C)-98.5 F (36.9 C)] 98.5 F (36.9 C) (07/16 0528) Pulse Rate:  [80-105] 80 (07/16 0528) Resp:  [16-29] 18 (07/16 0528) BP: (109-195)/(69-125) 145/86 mmHg (07/16 0528) SpO2:  [92 %-100 %] 99 % (07/16 0528) Weight:  [253 lb 15.5 oz (115.2 kg)-259 lb 1.6 oz (117.527 kg)] 253 lb 15.5 oz (115.2 kg) (07/15 2055) Physical Exam: GEN: appears obese, NAD Oropharynx: clear, moist CVS: regular rate and rythm, normal s1 and s2, no murmurs, no edema RESP: no increased work of breathing, no crackles or wheeze GI: normal bowel sound, soft, non-tender,non-distended MSK: no tenderness to palpation NEURO: alert and oriented x4, otherwise within normal limit PSYCH: A little bit tearful  Laboratory:  Recent Labs Lab 02/16/16 1150  WBC 9.3  HGB 13.2  HCT 41.9  PLT 324    Recent Labs Lab 02/16/16 1150  NA 139  K 3.7  CL 107  CO2 23  BUN 15  CREATININE 1.27*  CALCIUM 9.7  GLUCOSE 176*   Lipid Panel     Component Value Date/Time   CHOL 145 02/17/2016 0307   TRIG 123 02/17/2016 0307   HDL 32* 02/17/2016 0307   CHOLHDL 4.5 02/17/2016 0307   VLDL 25 02/17/2016 0307   LDLCALC 88 02/17/2016 0307   LDLDIRECT 71 07/12/2013 1610    Imaging/Diagnostic Tests: Dg Chest 2 View  02/16/2016  CLINICAL DATA:  Atrial fibrillation. EXAM: CHEST  2 VIEW COMPARISON:  12/10/2015 FINDINGS: The heart size and mediastinal contours are within normal limits. Both lungs are clear. The visualized skeletal structures are unremarkable. IMPRESSION: No active cardiopulmonary disease. Electronically  Signed   By: Misty Stanley [name]   On: 02/16/2016 13:11   Mr Jodene Nam Head Wo Contrast  02/16/2016   IMPRESSION: 1. 8 mm acute nonhemorrhagic linear infarct within the right external capsule. 2. Moderate diffuse periventricular and subcortical T2 changes bilaterally reflect the sequela of chronic microvascular ischemia. White matter changes extend into the brainstem 3. Remote lacunar infarcts of the thalami bilaterally and right paramedian midbrain. 4. Mild sinus disease. 5. No significant stenosis within the anterior circulation. 6. Mild narrowing of the distal left vertebral artery without a significant stenosis relative to the more distal vessels. Electronically Signed   By: San Morelle [name]   On: 02/16/2016 16:38   Mr Brain Wo Contrast  02/16/2016 IMPRESSION: 1. 8 mm acute nonhemorrhagic linear infarct within the right external capsule. 2. Moderate diffuse periventricular and subcortical T2 changes bilaterally reflect the sequela of chronic microvascular ischemia. White matter changes extend into the brainstem 3. Remote lacunar infarcts of the thalami bilaterally and right paramedian midbrain. 4. Mild sinus disease. 5. No significant stenosis within the anterior circulation. 6. Mild narrowing of the distal left vertebral artery without a significant stenosis relative to the more distal vessels. Electronically Signed   By: San Morelle [name]   On: 02/16/2016 16:38    Mercy Riding, MD 02/17/2016, 6:05 AM PGY-2, [place] Intern pager: [phone], text pages welcome

## 2016-02-18 ENCOUNTER — Inpatient Hospital Stay (HOSPITAL_COMMUNITY): Payer: Medicare Other

## 2016-02-18 DIAGNOSIS — I63411 Cerebral infarction due to embolism of right middle cerebral artery: Secondary | ICD-10-CM

## 2016-02-18 DIAGNOSIS — E1159 Type 2 diabetes mellitus with other circulatory complications: Secondary | ICD-10-CM

## 2016-02-18 DIAGNOSIS — I10 Essential (primary) hypertension: Secondary | ICD-10-CM

## 2016-02-18 DIAGNOSIS — E1165 Type 2 diabetes mellitus with hyperglycemia: Secondary | ICD-10-CM

## 2016-02-18 DIAGNOSIS — I480 Paroxysmal atrial fibrillation: Secondary | ICD-10-CM

## 2016-02-18 DIAGNOSIS — I6789 Other cerebrovascular disease: Secondary | ICD-10-CM

## 2016-02-18 LAB — ECHOCARDIOGRAM COMPLETE
Height: 63 in
Weight: 4063.52 oz

## 2016-02-18 LAB — GLUCOSE, CAPILLARY
GLUCOSE-CAPILLARY: 145 mg/dL — AB (ref 65–99)
GLUCOSE-CAPILLARY: 157 mg/dL — AB (ref 65–99)

## 2016-02-18 LAB — HEMOGLOBIN A1C
Hgb A1c MFr Bld: 9.3 % — ABNORMAL HIGH (ref 4.8–5.6)
Mean Plasma Glucose: 220 mg/dL

## 2016-02-18 MED ORDER — RIVAROXABAN 20 MG PO TABS: 20.0000 mg | ORAL_TABLET | Freq: Every day | ORAL | Status: DC

## 2016-02-18 MED ORDER — PERFLUTREN LIPID MICROSPHERE
1.0000 mL | INTRAVENOUS | Status: AC | PRN
  Administered 2016-02-18: 2 mL via INTRAVENOUS
  Filled 2016-02-18: qty 10

## 2016-02-18 MED ORDER — PNEUMOCOCCAL VAC POLYVALENT 25 MCG/0.5ML IJ INJ: 0.5000 mL | INJECTION | INTRAMUSCULAR | Status: DC

## 2016-02-18 MED ORDER — ATORVASTATIN CALCIUM 80 MG PO TABS: 80.0000 mg | ORAL_TABLET | Freq: Every day | ORAL | Status: DC

## 2016-02-18 NOTE — Discharge Instructions (Signed)
You were admitted to the hospital for a stroke affecting the right side of your brain and the left side of your body.  We have checked for a number of sources of stroke and we feel that it is likely due to your lack of medication for your atrial fibrillation (xarelto) and also due to uncontrolled diabetes as well as hypertension.  We feel that you have improved enough to go home and physical therapy has recommended that you receive home physical therapy at least 3x/week.  As for your medications, it is very important that you understand the following information: 1.  Stop taking crestor 2.  Continue 80mg  lipitor (we have given you a prescription) 3.  Continue diabetes medication (Lantus 55 units daily, novolog 20-25 units 3x a day with big meals and Victoza 1.8 at bedtime).    I have scheduled a follow up appointment with your primary care doctor for 02/25/16 at 9:00AM.

## 2016-02-18 NOTE — Progress Notes (Signed)
Patient was given discharge instructions, reviewed all medications, and importance of going to follow up appointments. PIV removed without any issues. Patient reported that her daughter didn't want to wait on her and would be negative about coming to get her. Pt's daughter upon arrival did seem upset and said "let's go." Patient was discharged.

## 2016-02-18 NOTE — Care Management Note (Signed)
Case Management Note  Patient Details  Name: [name] MRN: [id number] Date of Birth: May 03, 1956  Subjective/Objective:  NCM spoke with patient, she lives with her sister, who works here at [employer],  Patient has Medicare/Medicaid, she has a PCP at [facility].  NCM offered choice of [facility] agency in [facility], patient chose [facility].  Referral made to [facility] with [facility] for HHPT/HHOT, soc will begin 24-48 hrs post discharge. Patient will be on xarelto, per benefit check , co pay is 3.50 with Medicare/Medicaid.  NCM will give patient 30 day savings card.                     Action/Plan:   Expected Discharge Date:  02/21/16               Expected Discharge Plan:  [place]  In-House Referral:     Discharge planning Services  CM Consult, Medication Assistance  Post Acute Care Choice:    Choice offered to:  Patient  DME Arranged:    DME Agency:     HH Arranged:  PT, OT HH Agency:  [facility] (now [facility]  Status of Service:  Completed, signed off  If discussed at [place] of Stay Meetings, dates discussed:    Additional Comments:  Zenon Mayo, RN 02/18/2016, 1:59 PM

## 2016-02-18 NOTE — Progress Notes (Signed)
Occupational Therapy Evaluation Patient Details Name: [name] MRN: [id number] DOB: [birth year] Today's Date: 02/18/2016    History of Present Illness pt is a 60 y/o female with h/o stroke, DM, HTN, afib, depression, admitted with stroke.  MRI showed 70mm acut infarct in the Right external capsule.   Clinical Impression   Patient presents to OT with decreased ADL independence due to the deficits listed below. Will benefit from skilled OT to maximize function and to facilitate a safe discharge. OT will follow.    Follow Up Recommendations  Home health OT;Supervision - Intermittent    Equipment Recommendations  None recommended by OT    Recommendations for Other Services       Precautions / Restrictions Precautions Precautions: Fall Restrictions Weight Bearing Restrictions: No      Mobility Bed Mobility               General bed mobility comments: NT -- up in recliner  Transfers Overall transfer level: Needs assistance Equipment used: None Transfers: Sit to/from Stand;Stand Pivot Transfers Sit to Stand: Supervision Stand pivot transfers: Supervision            Balance                                            ADL Overall ADL's : Needs assistance/impaired Eating/Feeding: Independent;Sitting   Grooming: Set up;Sitting   Upper Body Bathing: Set up;Sitting   Lower Body Bathing: Supervison/ safety;Sit to/from stand   Upper Body Dressing : Set up;Sitting   Lower Body Dressing: Supervision/safety;Sit to/from stand   Toilet Transfer: Psychologist, counselling Details (indicate cue type and reason):  (simulated)         Functional mobility during ADLs: Supervision/safety       Vision     Perception     Praxis      Pertinent Vitals/Pain Pain Assessment: No/denies pain Pain Score: 0-No pain     Hand Dominance Right   Extremity/Trunk Assessment Upper Extremity Assessment Upper Extremity  Assessment: Overall WFL for tasks assessed   Lower Extremity Assessment Lower Extremity Assessment: Defer to PT evaluation   Cervical / Trunk Assessment Cervical / Trunk Assessment: Normal   Communication Communication Communication: No difficulties   Cognition Arousal/Alertness: Awake/alert Behavior During Therapy: WFL for tasks assessed/performed Overall Cognitive Status: Within Functional Limits for tasks assessed                     General Comments       Exercises       Shoulder Instructions      Home Living Family/patient expects to be discharged to:: Private residence Living Arrangements: Non-relatives/Friends Available Help at Discharge: Family;Available PRN/intermittently Type of Home: House Home Access: Stairs to enter [employer] of Steps: 3 Entrance Stairs-Rails: Right;Left Home Layout: One level     Bathroom Shower/Tub: Teacher,  years/pre: Standard     Home Equipment: Environmental consultant - 4 wheels;Tub bench;Kasandra Knudsen - single point      Lives With: Family    Prior Functioning/Environment Level of Independence: Independent with assistive device(s)             OT Diagnosis: Generalized weakness   OT Problem List: Decreased strength;Impaired balance (sitting and/or standing)   OT Treatment/Interventions: Self-care/ADL training;DME and/or AE instruction;Therapeutic activities;Patient/family education    OT Goals(Current goals can be found in the  care plan section) Acute Rehab OT Goals Patient Stated Goal: be able to do for myself. OT Goal Formulation: With patient Time For Goal Achievement: 03/03/16 Potential to Achieve Goals: Good ADL Goals Pt Will Perform Upper Body Bathing: with modified independence Pt Will Perform Lower Body Bathing: with modified independence Pt Will Perform Upper Body Dressing: with modified independence Pt Will Perform Lower Body Dressing: with modified independence Pt Will Transfer to Toilet: with  modified independence Pt Will Perform Toileting - Clothing Manipulation and hygiene: with modified independence  OT Frequency: Min 2X/week   Barriers to D/C:            Co-evaluation              End of Session    Activity Tolerance: Patient tolerated treatment well Patient left: in chair;with call bell/phone within reach;with family/visitor present   Time: 1215-1228 OT Time Calculation (min): 13 min Charges:  OT General Charges $OT Visit: 1 Procedure OT Evaluation $OT Eval Low Complexity: 1 Procedure G-Codes:    Dougles Kimmey A Mar 05, 2016, 1:02 PM

## 2016-02-18 NOTE — Progress Notes (Signed)
  Echocardiogram 2D Echocardiogram with Definity has been performed.  Darlina Sicilian M 02/18/2016, 12:11 PM

## 2016-02-18 NOTE — Progress Notes (Signed)
Speech Language Pathology Treatment: Dysphagia  Patient Details Name: [name] MRN: [id number] DOB: April 12, 1956 Today's Date: 02/18/2016 Time: 1040-1050 SLP Time Calculation (min) (ACUTE ONLY): 10 min  Assessment / Plan / Recommendation Clinical Impression  Pt seen for follow up after BSE completed 02/17/16. RN reports pt tolerating current diet, but prefers regular consistency. No overt s/s aspiration observed or reported. Pt indicated difficulty with large pieces of food if she eats too quickly or doesn't chew well, hence the Dys 3 recommendation. This is exacerbated by the fact that pt has no upper teeth. Pt was encouraged to take small bites and chew thoroughly once she returns home this afternoon. No further ST intervention recommended at this time.    HPI HPI: [name] is a 60 y.o. female presenting with stroke. PMH is significant for stroke, diabetes, hypertension, atrial fibrillation, depression, obesity. MRI/MRA brain with an 8 mm acute nonhemorrhagic linear infarct within the right external capsule and some chronic microvascular changes      SLP Plan  Discharge SLP treatment due to (comment);All goals met (DC home)     Recommendations  Diet recommendations: Dysphagia 3 (mechanical soft);Thin liquid Liquids provided via: Straw Medication Administration: Whole meds with liquid Supervision: Patient able to self feed Compensations: Minimize environmental distractions;Slow rate;Small sips/bites Postural Changes and/or Swallow Maneuvers: Seated upright 90 degrees;Upright 30-60 min after meal   Oral Care Recommendations: Oral care BID Follow up Recommendations: None Plan: Discharge SLP treatment due to (comment);All goals met (DC home)     Shonna Chock 02/18/2016, 11:16 AM  [facility] [place] [phone]

## 2016-02-18 NOTE — Care Management Important Message (Signed)
Important Message  Patient Details  Name: [name] MRN: [id number] Date of Birth: 1955-12-19   Medicare Important Message Given:  Yes    Nathen May 02/18/2016, 3:10 PM

## 2016-02-18 NOTE — Progress Notes (Signed)
Pharmacy - Xarelto  Indication: embolic stroke secondary to Afib Renal function back to normal. Last CBC was normal on 7/15. Dosing is appropriate.  Plan: Pharmacy signing off but will continue to follow peripherally  Renold Genta, PharmD, BCPS Clinical Pharmacist Pager: [phone] 02/18/2016 2:40 PM

## 2016-02-18 NOTE — Progress Notes (Signed)
Physical Therapy Treatment Patient Details Name: [name] MRN: [id number] DOB: 14-Jun-1956 Today's Date: 02/18/2016    History of Present Illness pt is a 60 y/o female with h/o stroke, DM, HTN, afib, depression, admitted with stroke.  MRI showed 76mm acut infarct in the Right external capsule.    PT Comments    Pt was seen in chair with house mate present. Pt is afraid that she will not get back to her normal gait and starting crying because "she could not go as far as she used to." Pt needed both seated and standing rest breaks during ambulation. Pt stated she switches between using a RW and straight cane at home. Pt would benefit from continued PT to help increased her functional independence at home.   Follow Up Recommendations  Home health PT     Equipment Recommendations  None recommended by PT    Recommendations for Other Services       Precautions / Restrictions Precautions Precautions: Fall Restrictions Weight Bearing Restrictions: No    Mobility  Bed Mobility               General bed mobility comments: NT -- up in recliner  Transfers Overall transfer level: Needs assistance Equipment used: Straight cane Transfers: Sit to/from Stand Sit to Stand: Supervision         General transfer comment: cue for hand placement, slow to rise. Sit <> stand x2  Ambulation/Gait Ambulation/Gait assistance: Supervision;Min guard Ambulation Distance (Feet): 100 Feet Assistive device: Straight cane Gait Pattern/deviations: Step-through pattern     General Gait Details: Pt supervision at beginning, then switched to min guard after rest break. pt needed 2 total  standing rest break. Chair was brought and pt sat down into chair to continue her 1st rest break. Vitals within normal range during ambulation. After stair training, pt stated her low back was starting to hurt. Pt presented with forward flexion of truck while walking and stated that she couldn't straighten up  because her back hurt. Pt needed 2nd standing rest break and asked therapist to push on her low back to relieve pain while she flexed her trunk. Decreased distance since last session due to fatigue and low back pain. Pt is able to walk short distances (to bathroom) without assistive device, but uses an assistive device (straight cane) walking long distances.   Stairs Stairs: Yes Stairs assistance: Min guard Stair Management: One rail Right Number of Stairs: 3 General stair comments: Pt used straight cane and rail ascending and descending stairs. Step-to pattern descending and ascending stairs.  Wheelchair Mobility    Modified Rankin (Stroke Patients Only) Modified Rankin (Stroke Patients Only) Pre-Morbid Rankin Score: Slight disability Modified Rankin: Moderate disability     Balance Overall balance assessment: Needs assistance Sitting-balance support: Feet supported;No upper extremity supported Sitting balance-Leahy Scale: Good     Standing balance support: No upper extremity supported;Single extremity supported;During functional activity Standing balance-Leahy Scale: Fair                      Cognition Arousal/Alertness: Awake/alert Behavior During Therapy: WFL for tasks assessed/performed Overall Cognitive Status: Within Functional Limits for tasks assessed                      Exercises      General Comments General comments (skin integrity, edema, etc.): Pt unplugged herself from her heart monitor and proceeded to use the bathroom. Nurse came in and advised pt to  keep herself hooked up to the the monitor. Therapist reminded pt at end of session to call for nurse to help her prepare to use the restroom. During rest break, pt started crying and stated she cannot walk like she used to. Pt stated she has both a RW and straight cane at home and switches back and forth with her roommate as to which one she uses.       Pertinent Vitals/Pain Pain Assessment:  0-10 Pain Score: 2  Faces Pain Scale: Hurts even more Pain Location: low back Pain Descriptors / Indicators: Aching;Discomfort Pain Intervention(s): Monitored during session;Limited activity within patient's tolerance    Home Living                      Prior Function            PT Goals (current goals can now be found in the care plan section) Acute Rehab PT Goals Patient Stated Goal: be able to do for myself. Progress towards PT goals: Progressing toward goals    Frequency  Min 3X/week    PT Plan      Co-evaluation             End of Session   Activity Tolerance: Patient limited by fatigue;Patient limited by pain Patient left: in chair;with call bell/phone within reach;with family/visitor present     Time: 1225-1253 PT Time Calculation (min) (ACUTE ONLY): 28 min  Charges:                       G CodesNash Mantis, [place] [phone]  02/18/2016, 5:19 PM

## 2016-02-18 NOTE — Progress Notes (Signed)
STROKE TEAM PROGRESS NOTE   SUBJECTIVE (INTERVAL HISTORY) No family is at the bedside.  Overall she feels her condition is stable and much improved. She had stroke at left pons in 08/2008 without residue. She has afib but stopped Xarelto by herself due to financial reasons. She is encouraged to speak to SW.    OBJECTIVE Temp:  [97.5 F (36.4 C)-97.9 F (36.6 C)] 97.5 F (36.4 C) (07/17 1229) Pulse Rate:  [66-78] 70 (07/17 1249) Cardiac Rhythm:  [-] Normal sinus rhythm (07/17 0824) Resp:  [14-23] 22 (07/17 1249) BP: (148-161)/(73-96) 148/73 mmHg (07/17 1249) SpO2:  [97 %-99 %] 98 % (07/17 1249)  CBC:   Recent Labs Lab 02/16/16 1150  WBC 9.3  HGB 13.2  HCT 41.9  MCV 71.9*  PLT 0000000    Basic Metabolic Panel:   Recent Labs Lab 02/16/16 1150 02/17/16 0639  NA 139 139  K 3.7 3.9  CL 107 109  CO2 23 24  GLUCOSE 176* 125*  BUN 15 12  CREATININE 1.27* 0.79  CALCIUM 9.7 8.7*    Lipid Panel:     Component Value Date/Time   CHOL 145 02/17/2016 0307   TRIG 123 02/17/2016 0307   HDL 32* 02/17/2016 0307   CHOLHDL 4.5 02/17/2016 0307   VLDL 25 02/17/2016 0307   LDLCALC 88 02/17/2016 0307   HgbA1c:  Lab Results  Component Value Date   HGBA1C 9.3* 02/17/2016   Urine Drug Screen:     Component Value Date/Time   LABOPIA NONE DETECTED 06/02/2014 1104   COCAINSCRNUR NONE DETECTED 06/02/2014 1104   LABBENZ NONE DETECTED 06/02/2014 1104   AMPHETMU NONE DETECTED 06/02/2014 1104   THCU NONE DETECTED 06/02/2014 1104   LABBARB NONE DETECTED 06/02/2014 1104      IMAGING I have personally reviewed the radiological images below and agree with the radiology interpretations.  Mri and Mra Head Wo Contrast 02/16/2016   1. 8 mm acute nonhemorrhagic linear infarct within the right external capsule.  2. Moderate diffuse periventricular and subcortical T2 changes bilaterally reflect the sequela of chronic microvascular ischemia. White matter changes extend into the brainstem  3.  Remote lacunar infarcts of the thalami bilaterally and right paramedian midbrain.  4. Mild sinus disease.  5. No significant stenosis within the anterior circulation.  6. Mild narrowing of the distal left vertebral artery without a significant stenosis relative to the more distal vessels.   CUS - Bilateral: No evidence of ICA stenosis. Vertebral artery flow is antegrade.  TTE - Left ventricle: The cavity size was normal. There was moderate  concentric hypertrophy. Systolic function was normal. The  estimated ejection fraction was in the range of 60% to 65%. Wall  motion was normal; there were no regional wall motion  abnormalities. Doppler parameters are consistent with abnormal  left ventricular relaxation (grade 1 diastolic dysfunction).  Doppler parameters are consistent with elevated ventricular  end-diastolic filling pressure. - Aortic valve: Trileaflet; normal thickness leaflets.  Transvalvular velocity was within the normal range. There was no  stenosis. There was no regurgitation. - Aortic root: The aortic root was normal in size. - Left atrium: The atrium was normal in size. - Right ventricle: The cavity size was normal. Wall thickness was  normal. Systolic function was normal. - Right atrium: The atrium was normal in size. - Tricuspid valve: There was no regurgitation. - Pulmonic valve: There was no regurgitation. - Pulmonary arteries: Systolic pressure was within the normal  range. - Inferior vena cava: The vessel  was normal in size. - Pericardium, extracardiac: There was no pericardial effusion.   PHYSICAL EXAM HEENT- Normocephalic, no lesions, without obvious abnormality. Normal external eye and conjunctiva.Normal external nose, mucus membranes and septum. Normal pharynx.  Cardiovascular- irregularly irregular rhythm, pulses palpable throughout   Lungs- chest clear, no wheezing, rales, normal symmetric air entry  Abdomen- soft, non-tender; bowel  sounds normal; no masses, no organomegaly  Extremities- less then 2 second capillary refill  Neurological Examination Mental Status: Alert, oriented, thought content appropriate. Speech fluent without evidence of aphasia. Able to follow 3 step commands without difficulty. Cranial Nerves: II: Visual fields grossly normal, pupils equal, round, reactive to light and accommodation III,IV, VI: ptosis not present, extra-ocular motions intact bilaterally V,VII: smile symmetric, facial light touch sensation normal bilaterally VIII: hearing normal bilaterally IX,X: uvula rises symmetrically XI: bilateral shoulder shrug XII: midline tongue extension Motor: Right :Upper extremity 5/5Left: Upper extremity 4+/5 Lower extremity 5/5Lower extremity 5/5 Tone and bulk:normal tone throughout; no atrophy noted Sensory: light touch intact throughout, bilaterally Cerebellar: normal finger-to-nose Gait: deferred   ASSESSMENT/PLAN [name] is a 60 y.o. female with history of atrial fibrillation (patient stopped Xarelto secondary to cost), hypertension, hyperlipidemia, previous strokes, and diabetes mellitus presenting with palpitations and mild left pronator drift. She did not receive IV t-PA due to unknown time of onset.  Stroke:  Right external capsule infarct likely embolic secondary to atrial fibrillation not on AC.   Resultant  Left sided weakness resolved  MRI - 8 mm acute infarct within the right external capsule with remote lacunar infarcts.   MRA - no high-grade stenosis.  Carotid Doppler unremarkable   2D Echo - EF 60-65%  LDL - 88  HgbA1c 9.3  VTE prophylaxis - Xarelto DIET DYS 3 Room service appropriate?: Yes; Fluid consistency:: Thin  No antithrombotic prior to admission, now on Xarelto (rivaroxaban) daily  Patient counseled to be compliant with her  antithrombotic medications  Ongoing aggressive stroke risk factor management  Therapy recommendations:  pending   Disposition: Pending  Afib not on AC  Was on Xarelto but stop by pt due to financial issue  Resumed this admission  SW consult for financial support  Hypertension  Stable Permissive hypertension (OK if < 220/120) but gradually normalize in 5-7 days Long-term BP goal normotensive  Hyperlipidemia  Home meds: Crestor 10 mg daily resumed in hospital  LDL 88, goal < 70  Now on Lipitor 80 mg daily   Continue statin at discharge  Diabetes  HgbA1c 9.3, goal < 7.0  Uncontrolled  Need close follow up with PCP  Management as per primary team  Other Stroke Risk Factors  Advanced age  Obesity, Body mass index is 45 kg/(m^2)., recommend weight loss, diet and exercise as appropriate   Hx stroke/TIA - 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  Hospital day # 2  Neurology will sign off. Please call with questions. Pt will follow up with Dr. Erlinda Hong at [facility] in about 2 months. Thanks for the consult.  Rosalin Hawking, MD PhD Stroke Neurology 02/18/2016 3:15 PM    To contact Stroke Continuity provider, please refer to [email or web address] After hours, contact General Neurology

## 2016-02-18 NOTE — Evaluation (Signed)
Speech Language Pathology Evaluation Patient Details Name: [name] MRN: [id number] DOB: Jun 02, 1956 Today's Date: 02/18/2016 Time: [id number] SLP Time Calculation (min) (ACUTE ONLY): 18 min  Problem List:  Patient Active Problem List   Diagnosis Date Noted  . BV (bacterial vaginosis)   . Stroke (cerebrum) ([place]) 02/16/2016  . Stroke ([place]) 02/16/2016  . Acute ischemic stroke ([place])   . Type 2 diabetes mellitus with circulatory disorder, with long-term current use of insulin ([place])   . Paroxysmal atrial fibrillation ([facility])   . Upper back pain 10/12/2015  . Abdominal pain 06/11/2015  . Leg cramps, sleep related 06/11/2015  . Pain of right scapula 03/14/2015  . Dental abscess 12/07/2014  . Chronic dental pain 10/13/2014  . Intractable migraine without aura and without status migrainosus 02/21/2014  . Dyspnea 01/24/2014  . Mild obstructive sleep apnea 11/18/2013  . Insomnia 11/18/2013  . Trapezius muscle strain 09/07/2013  . Other social stressor 07/15/2013  . Sinus congestion 07/12/2013  . Toenail fungus 12/30/2012  . Obese 10/22/2012  . Abnormality of gait and mobility 08/18/2011  . Edema 08/12/2011  . Constipation 05/28/2011  . History of CVA (cerebrovascular accident) 05/20/2011  . Chest pain 05/20/2011  . Hot flashes 04/28/2011  . Headache 11/18/2010  . Long term (current) use of anticoagulants 09/14/2010  . CARPAL TUNNEL SYNDROME, LEFT 02/19/2010  . Diabetes mellitus out of control ([place]) 06/12/2009  . Atrial fibrillation ([place]) 06/07/2009  . Dermatophytosis of foot 03/13/2009  . DEPRESSION 09/29/2008  . Hyperlipidemia associated with type 2 diabetes mellitus ([place]) 08/22/2008  . CHRONIC KIDNEY DISEASE STAGE II (M[facility]) 08/01/2008  . STRESS INCONTINENCE 07/31/2008  . Essential hypertension 04/16/2007   Past Medical History:  Past Medical History  Diagnosis Date  . Hypertension   . Hypertension     Hypertensive urgency 05/2006  . Atrial fibrillation ([place])   .  Hypercholesteremia   . CVA (cerebral vascular accident) ([place])     left pontine and frontal lobe  . Diabetes mellitus     type 2   Past Surgical History:  Past Surgical History  Procedure Laterality Date  . Tonsilectomy, adenoidectomy, bilateral myringotomy and tubes  age 38  . Dilation and curettage of uterus  1976  . Meniscus repair  03/09    right knee  . Tonsillectomy     HPI:  [name] is a 60 y.o. female presenting with stroke. PMH is significant for stroke, diabetes, hypertension, atrial fibrillation, depression, obesity. MRI/MRA brain with an 8 mm acute nonhemorrhagic linear infarct within the right external capsule and some chronic microvascular changes   Assessment / Plan / Recommendation Clinical Impression  The Mini-mental State Exam (MMSE) was administered. Pt scored 30/30, indicating cognition within functional limits. Pt indicated she has occasional difficulty with speech, largely due to being edentulous upper. No further ST intervention recommended at this time.    SLP Assessment  Patient does not need any further Speech Lanaguage Pathology Services    Follow Up Recommendations    n/a   Frequency and Duration   n/a        SLP Evaluation Prior Functioning  Cognitive/Linguistic Baseline: Within functional limits Type of Home: House  Lives With: Family Available Help at Discharge: Family;Available PRN/intermittently Education: GED, 2 months college Vocation: Unemployed   Cognition  Overall Cognitive Status: Within Functional Limits for tasks assessed Arousal/Alertness: Awake/alert Orientation Level: Oriented X4 Memory: Appears intact Awareness: Appears intact Problem Solving: Appears intact Comments: 30/30 on MMSE    Comprehension  Auditory Comprehension Overall Auditory Comprehension: Appears within functional limits for tasks assessed Reading Comprehension Reading Status: Within funtional limits    Expression Expression Primary Mode of  Expression: Verbal Verbal Expression Overall Verbal Expression: Appears within functional limits for tasks assessed Written Expression Dominant Hand: Right Written Expression: Within Functional Limits   Oral / Motor  Oral Motor/Sensory Function Overall Oral Motor/Sensory Function: Within functional limits Motor Speech Overall Motor Speech: Appears within functional limits for tasks assessed         Shonna Chock 02/18/2016, 11:12 AM  [facility] [facility] [place], [place] [phone]

## 2016-02-18 NOTE — Discharge Summary (Signed)
[facility] Discharge Summary  Patient name: [name] Medical record number: [id number] Date of birth: Dec 16, 1955 Age: 60 y.o. Gender: female Date of Admission: 02/16/2016  Date of Discharge: 02/18/16  Admitting Physician: Blane Ohara McDiarmid, MD  Primary Care Provider: Adin Hector, MD Consultants: Neurology  Indication for Hospitalization: Stroke  Discharge Diagnoses/Problem List:  Patient Active Problem List   Diagnosis Date Noted  . BV (bacterial vaginosis)   . Stroke (cerebrum) ([place]) 02/16/2016  . Stroke ([place]) 02/16/2016  . Acute ischemic stroke ([place])   . Type 2 diabetes mellitus with circulatory disorder, with long-term current use of insulin ([place])   . Paroxysmal atrial fibrillation (HCC)   . Upper back pain 10/12/2015  . Abdominal pain 06/11/2015  . Leg cramps, sleep related 06/11/2015  . Pain of right scapula 03/14/2015  . Dental abscess 12/07/2014  . Chronic dental pain 10/13/2014  . Intractable migraine without aura and without status migrainosus 02/21/2014  . Dyspnea 01/24/2014  . Mild obstructive sleep apnea 11/18/2013  . Insomnia 11/18/2013  . Trapezius muscle strain 09/07/2013  . Other social stressor 07/15/2013  . Sinus congestion 07/12/2013  . Toenail fungus 12/30/2012  . Obese 10/22/2012  . Abnormality of gait and mobility 08/18/2011  . Edema 08/12/2011  . Constipation 05/28/2011  . History of CVA (cerebrovascular accident) 05/20/2011  . Chest pain 05/20/2011  . Hot flashes 04/28/2011  . Headache 11/18/2010  . Long term (current) use of anticoagulants 09/14/2010  . CARPAL TUNNEL SYNDROME, LEFT 02/19/2010  . Diabetes mellitus out of control ([place]) 06/12/2009  . Atrial fibrillation ([place]) 06/07/2009  . Dermatophytosis of foot 03/13/2009  . DEPRESSION 09/29/2008  . Hyperlipidemia associated with type 2 diabetes mellitus ([place]) 08/22/2008  . CHRONIC KIDNEY DISEASE STAGE II ([facility]) 08/01/2008  . STRESS INCONTINENCE  07/31/2008  . Essential hypertension 04/16/2007     Disposition: Home with home PT  Discharge Condition: Stable, improved  Discharge Exam:  GEN: appears obese, NAD Oropharynx: clear, moist CVS: regular rate and rythm, normal s1 and s2, no murmurs, no edema RESP: no increased work of breathing, no crackles or wheeze GI: normal bowel sound, soft, non-tender,non-distended MSK: no tenderness to palpation, 4/5 stregth LUE, 5/5 RUE.  NEURO: alert and oriented x4, otherwise within normal limits PSYCH: A little bit tearful  Brief Hospital Course:  [name] is a 60 y.o. female presenting with stroke. PMH is significant for stroke, diabetes, hypertension, atrial fibrillation, depression, obesity  Right IC CVA:  Patient presented after a couple of hours of left arm and leg weakness, facial drooping and slurred speech.  She had a CT head negative for hemorrhagic process and an MRI/MRA brain with an 8 mm acute nonhemorrhagic linear infarct within the right external capsule and some chronic microvascular changes. Neuro saw her in the ED and recommended stroke work up.  She was previously on xarelto for afib, but couldn't afford it.  She hasn't taken it in 6 months.  She is also not on any kind of anticoagulation.  Neuro exam was largely normal on presentation and throughout her admission.  She had 4/5 weakness in her upper and lower extremities on the left side and some slurring of speech that improved during her admission.  She was seen by PT and OT and they both recommend outpatient PT/OT for a minimum of 3x per week.  Additionally, she had a carotid doppler that showed no ICA stenosis and anterograde flow.  She had an A1C of 9.1 despite  being on lantus, novolog and victoza and a normal echo except for hypertrophy in the LV.  Was started on xarelto prior to discharge and was recommended to continue this medication.   Hypertension:  The patient presented with hypertension to the 190s over 120s  in ED and was treated with permissive hypertension.  She slowly decreased her blood pressure and was mostly stable in the 150s-160s throughout her admission.  Patient will resume blood pressure medications after discharge.    Diabetes: On Lantus 55 units, NovoLog 20-25 units 3 times a day with meals and Victoza 1.8 at bedtime at home. Patient reports that she sometimes takes novolog and sometimes doesn't. A1C was 9.1 and glucose in the high 100s.  She was on lantus 20 units at night with SSI intermediates and was stable throughout admission.  She was educated to take her medicines as prescribed and to follow up with her primary doctor.   Atrial fibrillation without RVR: Not on any medication for this mainly due to cost issue. CHAD-Vasc score 4-5. She was in sinus rhytnm while admitted. Started xarelto yesterday and will get 30 day supply.  Patient said her sons will help her pay for the medication and social work confirmed that medicare/medicaid will provide the medication for 3-4 dollars.   Issues for Follow Up:  1.  Stroke:  No significant deficits, although patient does have 4/5 weakness in her LUE and LLE.  She will receive home PT/OT and has a follow up appointment with her primary doctor on 02/25/16 at 9:00AM.  Will continue xarelto and will need to stress at primary appointment the importance of taking this medication and the increased risk of stroke without it.   She is also recommended to schedule a follow up appointment with the stroke clinic to see  Xu,Jindong, MD.   2.  Diabetes:  Needs to be reeducated on how to properly take her medication.  A1C >9.    Significant Procedures:  None  Significant Labs and Imaging:   Recent Labs Lab 02/16/16 1150  WBC 9.3  HGB 13.2  HCT 41.9  PLT 324    Recent Labs Lab 02/16/16 1150 02/17/16 0639  NA 139 139  K 3.7 3.9  CL 107 109  CO2 23 24  GLUCOSE 176* 125*  BUN 15 12  CREATININE 1.27* 0.79  CALCIUM 9.7 8.7*    Results/Tests Pending  at Time of Discharge: None  Discharge Medications:    Medication List    STOP taking these medications        rosuvastatin 10 MG tablet  Commonly known as:  CRESTOR      TAKE these medications        albuterol 108 (90 Base) MCG/ACT inhaler  Commonly known as:  PROVENTIL HFA;VENTOLIN HFA  Inhale 2 puffs into the lungs every 6 (six) hours as needed for wheezing or shortness of breath.     amLODipine 10 MG tablet  Commonly known as:  NORVASC  Take 1 tablet (10 mg total) by mouth daily.     atorvastatin 80 MG tablet  Commonly known as:  LIPITOR  Take 1 tablet (80 mg total) by mouth daily at 6 PM.     cloNIDine 0.2 MG tablet  Commonly known as:  CATAPRES  Take 1 tablet (0.2 mg total) by mouth 2 (two) times daily.     insulin aspart 100 UNIT/ML injection  Commonly known as:  novoLOG  Inject 20 Units into the skin 3 (three) times daily with  meals.     Insulin Glargine 100 UNIT/ML Solostar Pen  Commonly known as:  LANTUS  Inject 55 Units into the skin daily.     Liraglutide 18 MG/3ML Sopn  Commonly known as:  VICTOZA  Inject 0.3 mLs (1.8 mg total) into the skin daily.     lisinopril 40 MG tablet  Commonly known as:  PRINIVIL,ZESTRIL  TAKE 1 TABLET(40 MG) BY MOUTH DAILY     omeprazole 20 MG capsule  Commonly known as:  PRILOSEC  Take 1 capsule (20 mg total) by mouth 2 (two) times daily before a meal.     rivaroxaban 20 MG Tabs tablet  Commonly known as:  XARELTO  Take 1 tablet (20 mg total) by mouth daily with supper.     spironolactone 25 MG tablet  Commonly known as:  ALDACTONE  Take 1 tablet (25 mg total) by mouth daily.        Discharge Instructions: Please refer to Patient Instructions section of EMR for full details.  Patient was counseled important signs and symptoms that should prompt return to medical care, changes in medications, dietary instructions, activity restrictions, and follow up appointments.   Follow-Up Appointments: Follow-up Information     Follow up with [place] Bing, DO. Go on 02/25/2016.   Why:  @ 9:00AM for hospital follow up   Contact information:   [address] [place] [address] [phone]       Follow up with [facility].   Why:  HHPT, HHOT   Contact information:   [address] [place] [address] [phone]       Follow up with Xu,Jindong, MD. Schedule an appointment as soon as possible for a visit in 2 months.   Specialty:  Neurology   Why:  stroke clinic   Contact information:   [address] [place] [address] [phone]       Eloise Levels, MD 02/18/2016, 3:34 PM PGY-1, [place]

## 2016-02-19 DIAGNOSIS — E669 Obesity, unspecified: Secondary | ICD-10-CM | POA: Diagnosis not present

## 2016-02-19 DIAGNOSIS — E119 Type 2 diabetes mellitus without complications: Secondary | ICD-10-CM | POA: Diagnosis not present

## 2016-02-19 DIAGNOSIS — I129 Hypertensive chronic kidney disease with stage 1 through stage 4 chronic kidney disease, or unspecified chronic kidney disease: Secondary | ICD-10-CM | POA: Diagnosis not present

## 2016-02-19 DIAGNOSIS — I4891 Unspecified atrial fibrillation: Secondary | ICD-10-CM | POA: Diagnosis not present

## 2016-02-19 DIAGNOSIS — Z8673 Personal history of transient ischemic attack (TIA), and cerebral infarction without residual deficits: Secondary | ICD-10-CM | POA: Diagnosis not present

## 2016-02-19 DIAGNOSIS — N182 Chronic kidney disease, stage 2 (mild): Secondary | ICD-10-CM | POA: Diagnosis not present

## 2016-02-19 LAB — VAS US CAROTID
LCCADDIAS: -25 cm/s
LCCAPDIAS: 21 cm/s
LEFT ECA DIAS: -7 cm/s
LEFT VERTEBRAL DIAS: -22 cm/s
LICADDIAS: -25 cm/s
LICAPSYS: -63 cm/s
Left CCA dist sys: -81 cm/s
Left CCA prox sys: 114 cm/s
Left ICA dist sys: -75 cm/s
Left ICA prox dias: -20 cm/s
RIGHT ECA DIAS: -11 cm/s
RIGHT VERTEBRAL DIAS: -11 cm/s
Right CCA prox dias: 14 cm/s
Right CCA prox sys: 71 cm/s
Right cca dist sys: -76 cm/s

## 2016-02-22 DIAGNOSIS — E119 Type 2 diabetes mellitus without complications: Secondary | ICD-10-CM | POA: Diagnosis not present

## 2016-02-22 DIAGNOSIS — N182 Chronic kidney disease, stage 2 (mild): Secondary | ICD-10-CM | POA: Diagnosis not present

## 2016-02-22 DIAGNOSIS — I129 Hypertensive chronic kidney disease with stage 1 through stage 4 chronic kidney disease, or unspecified chronic kidney disease: Secondary | ICD-10-CM | POA: Diagnosis not present

## 2016-02-22 DIAGNOSIS — Z8673 Personal history of transient ischemic attack (TIA), and cerebral infarction without residual deficits: Secondary | ICD-10-CM | POA: Diagnosis not present

## 2016-02-22 DIAGNOSIS — I4891 Unspecified atrial fibrillation: Secondary | ICD-10-CM | POA: Diagnosis not present

## 2016-02-22 DIAGNOSIS — E669 Obesity, unspecified: Secondary | ICD-10-CM | POA: Diagnosis not present

## 2016-02-25 ENCOUNTER — Encounter: Payer: Self-pay | Admitting: Family Medicine

## 2016-02-25 ENCOUNTER — Ambulatory Visit (INDEPENDENT_AMBULATORY_CARE_PROVIDER_SITE_OTHER): Payer: Medicare Other | Admitting: Family Medicine

## 2016-02-25 DIAGNOSIS — Z794 Long term (current) use of insulin: Secondary | ICD-10-CM | POA: Diagnosis not present

## 2016-02-25 DIAGNOSIS — Z8673 Personal history of transient ischemic attack (TIA), and cerebral infarction without residual deficits: Secondary | ICD-10-CM

## 2016-02-25 DIAGNOSIS — E1159 Type 2 diabetes mellitus with other circulatory complications: Secondary | ICD-10-CM | POA: Diagnosis not present

## 2016-02-25 DIAGNOSIS — I10 Essential (primary) hypertension: Secondary | ICD-10-CM | POA: Diagnosis not present

## 2016-02-25 DIAGNOSIS — I639 Cerebral infarction, unspecified: Secondary | ICD-10-CM | POA: Diagnosis not present

## 2016-02-25 NOTE — Assessment & Plan Note (Signed)
Reason for visit s/p hospital admission. D/c on Xarelto 20 mg. Tolerating well. No worsening or new focal deficits.  - Discussed importance of compliance with Xarelto as benefits outweigh risk of bleeding. Patient understands and can afford medication since d/c. - Ambulating with friends cane. Seeing HH PT and OT. - Scheduled visit with neurologist on 9/20. - Discussed reasons why patient would need to seek emergent medical attention.

## 2016-02-25 NOTE — Assessment & Plan Note (Addendum)
Pressure 160/90 at present. Patient took blood pressure meds this AM and has left sided headache without red flag symptoms. - Continue meds. - BP acceptable today s/p stoke.

## 2016-02-25 NOTE — Assessment & Plan Note (Signed)
>>  ASSESSMENT AND PLAN FOR UNCONTROLLED DIABETES MELLITUS WITH COMPLICATION, WITH LONG-TERM CURRENT USE OF INSULIN WRITTEN ON 02/25/2016 10:18 AM BY MCMULLEN, DAVID J, DO  A1C was 9.1 during admission. On insulin regimen. Needs follow up with PCP.

## 2016-02-25 NOTE — Assessment & Plan Note (Signed)
A1C was 9.1 during admission. On insulin regimen. Needs follow up with PCP.

## 2016-02-25 NOTE — Progress Notes (Signed)
   CC: Hospitalization follow-up  Subjective: HPI: [name] is a 59 y.o. female who presents to clinic today for follow-up relating to a stroke. Admitted on 02/16/16 and discharged on 02/18/16.  Problems discussed today are as follows:  Stroke: Patient presented to the hospital with a right IC CVA with slurred speech and left sided weakness in both upper and lower extremities. History of prior strokes and a-fib. Was suppose to be on Xarelto but not taken in 6 months due to cost. Stroke is likely due to her a-fib. Patient was given 30 day prescription of Xarelto, tolerating well. Patient is now able to afford mediation since d/c. Patient says she has improved since admission but needs to walk with her friends cane. No new complaints. Denies fevers, loss of vision, worsening weakness, or slurred speech.  High Blood Pressure: Systolic was 99991111 during admission but discharged with systolic in the [id number]. Complains of sharp throbbing intermittent left sided headache but consistent with previous headaches. No thunderclap headaches, fevers, change of vision.   Review of Symptoms:  See HPI for pertinent ROS.    Banks: The patient's pertinent past medical, surgical, family, and social history were reviewed and updated as appropriate. Smoking Status noted.  Objective: Pulse 81   Temp 98.5 F (36.9 C) (Oral)   Ht 5\' 3"  (1.6 m)   Wt 260 lb 6.4 oz (118.1 kg)   SpO2 99%   BMI 46.13 kg/m  GEN: patient is cooperative and pleasant, no acute distress. HEENT: PERRLA, EOM with full ROM, no conjunctival injection, no lid lag, normal tympanic light reflex, no nasal polyps, no rhinorrhea, no pharyngeal erythema or exudates, neck has full ROM without cervical or supraclavicular adenopathy, no thyromegaly or nodules. LUNG: clear to auscultation bilaterally, no wheezes/rhonchi/rales, no use of accessory muscles. CV: RRR, no m/r/g, no carotid bruits bilaterally, no peripheral edema. GI: soft,  non-distended, non-tender, normoactive bowel sounds, no hepatosplenomegaly. SKIN: warm and dry, no rashes or lesions. MSK: 5/5 upper and lower motor stregth bilaterally. NEURO: II-XII grossly intact, normal but sluggish gait with cane, peripheral sensation intact. PSYCH: alert & oriented x3, tearful during exam, appropriate affect. Denies suicidal or homicidal thoughts or actions.  Assessment & Plan:  History of CVA (cerebrovascular accident) Reason for visit s/p hospital admission. D/c on Xarelto 20 mg. Tolerating well. No worsening or new focal deficits.  - Discussed importance of compliance with Xarelto as benefits outweigh risk of bleeding. Patient understands and can afford medication since d/c. - Ambulating with friends cane. Seeing HH PT and OT. - Scheduled visit with neurologist on 9/20. - Discussed reasons why patient would need to seek emergent medical attention.  Essential hypertension Pressure 160/90 at present. Patient took blood pressure meds this AM and has left sided headache without red flag symptoms. - Continue meds. - BP acceptable today s/p stoke.  Type 2 diabetes mellitus with circulatory disorder, with long-term current use of insulin (HCC) A1C was 9.1 during admission. On insulin regimen. Needs follow up with PCP.   No orders of the defined types were placed in this encounter.   No orders of the defined types were placed in this encounter.    Harriet Butte, DO, PGY-1 02/25/2016 12:27 PM

## 2016-02-25 NOTE — Patient Instructions (Addendum)
It was a pleasure to meet you today. Please see below to review our plan for today's visit.  Stroke: - Please take you Xarelto every day as prescribed as this will prevent future strokes and the benefits outweigh the risk of bleeding.  - Follow up with your neurologist for further management. - Continue using cane as needed and continue seeing home health for PT and OT. - If symptoms worsen or you begin to have change in speech, vision, or change in movement, please go to the emergency room immediately.  High Blood Pressure: - Pressure today was 160/90 which is reasonable after a stroke. - Continue taking blood pressure medication as prescribed. - Please go to the emergency room if you begin to feel like you have the worst headache of your life or you loose your vision. - Follow up with PCP for management of blood pressure.  Diabetes: - Your A1C was 9.1 during admission, this elevated. - Continue taking your diabetes medication as prescribed.  - Please log your sugar levels first thing in the morning and 1-hour after meals and record them in a diary so you can review them with your PCP.  Please call the clinic at [phone] if you have any questions or concerns. Have a great day. -- Dr. Yisroel Ramming, [place]  Embolectomy and Thrombectomy, Care After Refer to this sheet in the next few weeks. These discharge instructions provide you with general information on caring for yourself after you leave the hospital. Your health care provider may also give you specific instructions. Your treatment has been planned according to the most current medical practices available, but unavoidable complications sometimes occur. If you have any problems or questions after discharge, call your health care provider. HOME CARE INSTRUCTIONS  You will probably be put on blood thinners. Take them as directed.  Tell your health care provider about any unusual bruising or bleeding,  including nosebleeds, blood in your urine, blood in your stools, or if you are vomiting blood. Blood in the stools may be black and tarry or red.  Keep your follow-up appointments to check how thin your blood is.  Change your bandages (dressings) as instructed.  Do not swim, bathe, or shower except as allowed by your health care provider.  Do not smoke.  Do not drink alcohol.  Do not use any tobacco products including cigarettes, chewing tobacco, or electronic cigarettes. SEEK IMMEDIATE MEDICAL CARE IF:  You have bleeding from the surgical site.  You have a sudden pain in the foot, leg, hand, or arm.  You have sudden abdominal pain.  You have a sudden facial droop, weakness on one side of your body, or trouble speaking.  You have bloody stools.  You vomit blood.  You suddenly feel short of breath, weak, or dizzy.  You have chest pain.  You have drainage, redness, swelling, or pain at the surgery site.  You have a fever. MAKE SURE YOU:  Understand these instructions.  Will watch your condition.  Will get help right away if you are not doing well or get worse.   This information is not intended to replace advice given to you by your health care provider. Make sure you discuss any questions you have with your health care provider.   Document Released: 11/15/2010 Document Revised: 08/11/2014 Document Reviewed: 11/15/2010 Elsevier Interactive Patient Education [employer].

## 2016-02-26 DIAGNOSIS — E669 Obesity, unspecified: Secondary | ICD-10-CM | POA: Diagnosis not present

## 2016-02-26 DIAGNOSIS — I129 Hypertensive chronic kidney disease with stage 1 through stage 4 chronic kidney disease, or unspecified chronic kidney disease: Secondary | ICD-10-CM | POA: Diagnosis not present

## 2016-02-26 DIAGNOSIS — N182 Chronic kidney disease, stage 2 (mild): Secondary | ICD-10-CM | POA: Diagnosis not present

## 2016-02-26 DIAGNOSIS — Z8673 Personal history of transient ischemic attack (TIA), and cerebral infarction without residual deficits: Secondary | ICD-10-CM | POA: Diagnosis not present

## 2016-02-26 DIAGNOSIS — I4891 Unspecified atrial fibrillation: Secondary | ICD-10-CM | POA: Diagnosis not present

## 2016-02-26 DIAGNOSIS — E119 Type 2 diabetes mellitus without complications: Secondary | ICD-10-CM | POA: Diagnosis not present

## 2016-02-28 ENCOUNTER — Telehealth: Payer: Self-pay | Admitting: Internal Medicine

## 2016-02-28 NOTE — Telephone Encounter (Signed)
[name] called from [facility] stating the pt was recently released from the hospital and needs a skilled nursing evaluation with lots information on diet, diabetes, and community resources, but not hand outs because pt is illiterate. Please fax Erin at [phone] ep

## 2016-03-04 DIAGNOSIS — N182 Chronic kidney disease, stage 2 (mild): Secondary | ICD-10-CM | POA: Diagnosis not present

## 2016-03-04 DIAGNOSIS — Z8673 Personal history of transient ischemic attack (TIA), and cerebral infarction without residual deficits: Secondary | ICD-10-CM | POA: Diagnosis not present

## 2016-03-04 DIAGNOSIS — I129 Hypertensive chronic kidney disease with stage 1 through stage 4 chronic kidney disease, or unspecified chronic kidney disease: Secondary | ICD-10-CM | POA: Diagnosis not present

## 2016-03-04 DIAGNOSIS — I4891 Unspecified atrial fibrillation: Secondary | ICD-10-CM | POA: Diagnosis not present

## 2016-03-04 DIAGNOSIS — E669 Obesity, unspecified: Secondary | ICD-10-CM | POA: Diagnosis not present

## 2016-03-04 DIAGNOSIS — E119 Type 2 diabetes mellitus without complications: Secondary | ICD-10-CM | POA: Diagnosis not present

## 2016-03-05 ENCOUNTER — Other Ambulatory Visit: Payer: Self-pay | Admitting: *Deleted

## 2016-03-05 MED ORDER — CLONIDINE HCL 0.2 MG PO TABS: 0.2000 mg | ORAL_TABLET | Freq: Two times a day (BID) | ORAL | 0 refills | Status: DC

## 2016-03-05 MED ORDER — AMLODIPINE BESYLATE 10 MG PO TABS: 10.0000 mg | ORAL_TABLET | Freq: Every day | ORAL | 0 refills | Status: DC

## 2016-03-07 ENCOUNTER — Telehealth: Payer: Self-pay | Admitting: Internal Medicine

## 2016-03-07 DIAGNOSIS — N182 Chronic kidney disease, stage 2 (mild): Secondary | ICD-10-CM | POA: Diagnosis not present

## 2016-03-07 DIAGNOSIS — E119 Type 2 diabetes mellitus without complications: Secondary | ICD-10-CM | POA: Diagnosis not present

## 2016-03-07 DIAGNOSIS — E669 Obesity, unspecified: Secondary | ICD-10-CM | POA: Diagnosis not present

## 2016-03-07 DIAGNOSIS — I129 Hypertensive chronic kidney disease with stage 1 through stage 4 chronic kidney disease, or unspecified chronic kidney disease: Secondary | ICD-10-CM | POA: Diagnosis not present

## 2016-03-07 DIAGNOSIS — Z8673 Personal history of transient ischemic attack (TIA), and cerebral infarction without residual deficits: Secondary | ICD-10-CM | POA: Diagnosis not present

## 2016-03-07 DIAGNOSIS — I4891 Unspecified atrial fibrillation: Secondary | ICD-10-CM | POA: Diagnosis not present

## 2016-03-07 NOTE — Telephone Encounter (Signed)
Jim from [facility] called and would like to have verbal orders to see the patient for OT 1 time a week for 4 weeks. He is also request a Education officer, museum to go out and speak to the patient. Please call him at [phone]. jw

## 2016-03-11 ENCOUNTER — Other Ambulatory Visit: Payer: Self-pay | Admitting: Family Medicine

## 2016-03-11 DIAGNOSIS — E119 Type 2 diabetes mellitus without complications: Secondary | ICD-10-CM | POA: Diagnosis not present

## 2016-03-11 DIAGNOSIS — I4891 Unspecified atrial fibrillation: Secondary | ICD-10-CM | POA: Diagnosis not present

## 2016-03-11 DIAGNOSIS — I129 Hypertensive chronic kidney disease with stage 1 through stage 4 chronic kidney disease, or unspecified chronic kidney disease: Secondary | ICD-10-CM | POA: Diagnosis not present

## 2016-03-11 DIAGNOSIS — N182 Chronic kidney disease, stage 2 (mild): Secondary | ICD-10-CM | POA: Diagnosis not present

## 2016-03-11 DIAGNOSIS — E669 Obesity, unspecified: Secondary | ICD-10-CM | POA: Diagnosis not present

## 2016-03-11 DIAGNOSIS — Z8673 Personal history of transient ischemic attack (TIA), and cerebral infarction without residual deficits: Secondary | ICD-10-CM | POA: Diagnosis not present

## 2016-03-14 DIAGNOSIS — Z8673 Personal history of transient ischemic attack (TIA), and cerebral infarction without residual deficits: Secondary | ICD-10-CM | POA: Diagnosis not present

## 2016-03-14 DIAGNOSIS — N182 Chronic kidney disease, stage 2 (mild): Secondary | ICD-10-CM | POA: Diagnosis not present

## 2016-03-14 DIAGNOSIS — I129 Hypertensive chronic kidney disease with stage 1 through stage 4 chronic kidney disease, or unspecified chronic kidney disease: Secondary | ICD-10-CM | POA: Diagnosis not present

## 2016-03-14 DIAGNOSIS — I4891 Unspecified atrial fibrillation: Secondary | ICD-10-CM | POA: Diagnosis not present

## 2016-03-14 DIAGNOSIS — E669 Obesity, unspecified: Secondary | ICD-10-CM | POA: Diagnosis not present

## 2016-03-14 DIAGNOSIS — E119 Type 2 diabetes mellitus without complications: Secondary | ICD-10-CM | POA: Diagnosis not present

## 2016-03-18 ENCOUNTER — Ambulatory Visit (INDEPENDENT_AMBULATORY_CARE_PROVIDER_SITE_OTHER): Payer: Medicare Other | Admitting: Internal Medicine

## 2016-03-18 ENCOUNTER — Encounter: Payer: Self-pay | Admitting: Internal Medicine

## 2016-03-18 DIAGNOSIS — Z794 Long term (current) use of insulin: Secondary | ICD-10-CM | POA: Diagnosis not present

## 2016-03-18 DIAGNOSIS — F329 Major depressive disorder, single episode, unspecified: Secondary | ICD-10-CM

## 2016-03-18 DIAGNOSIS — I639 Cerebral infarction, unspecified: Secondary | ICD-10-CM

## 2016-03-18 DIAGNOSIS — F32A Depression, unspecified: Secondary | ICD-10-CM

## 2016-03-18 DIAGNOSIS — E1159 Type 2 diabetes mellitus with other circulatory complications: Secondary | ICD-10-CM

## 2016-03-18 DIAGNOSIS — I10 Essential (primary) hypertension: Secondary | ICD-10-CM | POA: Diagnosis not present

## 2016-03-18 MED ORDER — SERTRALINE HCL 50 MG PO TABS: 50.0000 mg | ORAL_TABLET | Freq: Every day | ORAL | 3 refills | Status: DC

## 2016-03-18 MED ORDER — RIVAROXABAN 20 MG PO TABS: 20.0000 mg | ORAL_TABLET | Freq: Every day | ORAL | 11 refills | Status: DC

## 2016-03-18 MED ORDER — GLUCOSE BLOOD VI STRP: ORAL_STRIP | 12 refills | Status: DC

## 2016-03-18 MED ORDER — ATORVASTATIN CALCIUM 80 MG PO TABS: 80.0000 mg | ORAL_TABLET | Freq: Every day | ORAL | 5 refills | Status: DC

## 2016-03-18 MED ORDER — CLONIDINE HCL 0.2 MG PO TABS: 0.2000 mg | ORAL_TABLET | Freq: Two times a day (BID) | ORAL | 5 refills | Status: DC

## 2016-03-18 MED ORDER — SPIRONOLACTONE 50 MG PO TABS: 50.0000 mg | ORAL_TABLET | Freq: Every day | ORAL | 5 refills | Status: DC

## 2016-03-18 NOTE — Progress Notes (Signed)
Reviewed home meds with pt for Dr. Avon Gully in anticipation of visit.   Pt reports she stopped taking Xarelto at her son's advice x several months, had another stroke in July, and has since restarted Xarelto.  Pt is not taking clonidine at this time and reports a refill request was denied after her last hospitalization.   Pt currently taking lantus 55 units daily and novolog 20 units 2-3 times daily with meals. She does not check her BG daily as she is trying to make her test strips last. Pt denies s/sx hypoglycemia. Per BG meter, avg BG ~250.   Pt asks for refills of xarelto, lipitor, test strips at this visit. Pt expressed to me that she feels discouraged about her health and states her mood has been lower than usual. She does report that she has several family members that rely on her.  Pt is not interested in counseling at this time but is open to short-term trial of antidepressants.   All of the above was relayed to Dr. Avon Gully.  Carlean Jews, Pharm.D. PGY1 Pharmacy Resident 8/15/201710:29 AM Pager [phone]

## 2016-03-18 NOTE — Patient Instructions (Addendum)
It was nice seeing you again today Ms. Gidney!  Please start taking clonidine again two times a day. We have also increased your dose of spironolactone to 50 mg (rather than 25 mg) once a day.   For your mood, please start taking sertraline (Zoloft) 50 mg once a day. I will see you back in one month to see if your symptoms have improved.   Also, please be sure to keep your appointment with [name] to discuss your medications. Please start measuring your blood sugar three times a day, and writing down the measurements. It is important to bring this blood sugar log with you to your appointment with [name].   If you have any questions or concerns, please feel free to call the clinic.   Be well,  Dr. Avon Gully

## 2016-03-18 NOTE — Assessment & Plan Note (Signed)
Poorly controlled, with last A1C one month ago at 9.  - Continue current meds - Novolog 20U BID, Lantus 55U qd, Victoza 1.8 qd - Schedule appt with [name] to discuss possible med changes - Refilled test strips and encouraged patient to check blood sugar more frequently. Also encouraged patient to write down blood sugars and bring log to appointment with [name].

## 2016-03-18 NOTE — Progress Notes (Signed)
   Subjective:    Patient ID: [name], female    DOB: March 19, 1956, 60 y.o.   MRN: [id number]  HPI  Patient presenting for mood change as well as HTN and DM.   Mood change Endorses sad mood recently after the death of four family members in six weeks. Three of the deaths were unexpected, and the patient and her children are having a hard time dealing with these deaths. She reports that her children are arguing both with each other and with other family members, and she feels she has to keep the peace. She is interested in beginning a medication today if that would be helpful, but says she only wants to take a medication if it can be temporary. Denies SI or HI. No known prior diagnosis of psychiatric disorders, does not think that she has ever taken any medications for mood.   HTN Reports blood pressure always elevated when measured by her physical therapist. Endorses HA and cloudy vision when BP is particularly elevated. Is unsure of recent measurements, as her PT simply tells her her BP is high. Is not taking clonidine, but is taking all other prescribed BP meds. Denies missing doses.   Type II DM Patient reporting persistently elevated blood sugars, typically in 250s. Measuring blood sugar only twice a day, as she is concerned she will run out of test strips. Taking Novolog 20U BID. Does not take at lunchtime because she does not typically eat lunch. Taking Lantus 55U and Victoza 1.8 at night. Denies missing doses.   Review of Systems     Objective:   Physical Exam  Constitutional: She is oriented to person, place, and time.  Obese elderly female in NAD  HENT:  Head: Normocephalic and atraumatic.  Eyes: Conjunctivae and EOM are normal. Pupils are equal, round, and reactive to light. Right eye exhibits no discharge. Left eye exhibits no discharge.  Cardiovascular: Normal rate, regular rhythm and normal heart sounds.   No murmur heard. Pulmonary/Chest: Effort normal and breath  sounds normal. No respiratory distress. She has no wheezes.  Abdominal: Soft. Bowel sounds are normal. She exhibits no distension. There is no tenderness.  Neurological: She is alert and oriented to person, place, and time.  Skin: Skin is warm and dry.  Psychiatric: She has a normal mood and affect. Her behavior is normal.      Assessment & Plan:  Essential hypertension Poorly controlled. BP 164/95 in office today.  - Resume clonidine 0.2 mg BID - Increase spironolactone to 50mg  qd - Continue Norvasc and Lipitor - F/u at next visit in one month  Type 2 diabetes mellitus with circulatory disorder, with long-term current use of insulin ([place]) Poorly controlled, with last A1C one month ago at 9.  - Continue current meds - Novolog 20U BID, Lantus 55U qd, Victoza 1.8 qd - Schedule appt with [name] to discuss possible med changes - Refilled test strips and encouraged patient to check blood sugar more frequently. Also encouraged patient to write down blood sugars and bring log to appointment with [name].   Depression Worsening sadness after death of four family members. PHQ-9 score 17. Denies HI or SI. Declines meeting with [facility] consultant in office today or f/u with Dr. Gwenlyn Saran.  - Begin sertraline 50mg  qd - F/u in one month. Consider increasing dose if appropriate.   Adin Hector, MD, MPH PGY-2 [facility] Pager [phone]

## 2016-03-18 NOTE — Assessment & Plan Note (Signed)
Poorly controlled. BP 164/95 in office today.  - Resume clonidine 0.2 mg BID - Increase spironolactone to 50mg  qd - Continue Norvasc and Lipitor - F/u at next visit in one month

## 2016-03-18 NOTE — Assessment & Plan Note (Signed)
>>  ASSESSMENT AND PLAN FOR UNCONTROLLED DIABETES MELLITUS WITH COMPLICATION, WITH LONG-TERM CURRENT USE OF INSULIN WRITTEN ON 03/18/2016 12:00 PM BY LANCASTER, ABIGAIL JOSEPH, MD  Poorly controlled, with last A1C one month ago at 9.  - Continue current meds - Novolog 20U BID, Lantus 55U qd, Victoza 1.8 qd - Schedule appt with [name] to discuss possible med changes - Refilled test strips and encouraged patient to check blood sugar more frequently. Also encouraged patient to write down blood sugars and bring log to appointment with [name].

## 2016-03-18 NOTE — Assessment & Plan Note (Signed)
Worsening sadness after death of four family members. PHQ-9 score 17. Denies HI or SI. Declines meeting with [facility] consultant in office today or f/u with Dr. Gwenlyn Saran.  - Begin sertraline 50mg  qd - F/u in one month. Consider increasing dose if appropriate.

## 2016-03-19 DIAGNOSIS — I129 Hypertensive chronic kidney disease with stage 1 through stage 4 chronic kidney disease, or unspecified chronic kidney disease: Secondary | ICD-10-CM | POA: Diagnosis not present

## 2016-03-19 DIAGNOSIS — I4891 Unspecified atrial fibrillation: Secondary | ICD-10-CM | POA: Diagnosis not present

## 2016-03-19 DIAGNOSIS — Z8673 Personal history of transient ischemic attack (TIA), and cerebral infarction without residual deficits: Secondary | ICD-10-CM | POA: Diagnosis not present

## 2016-03-19 DIAGNOSIS — E119 Type 2 diabetes mellitus without complications: Secondary | ICD-10-CM | POA: Diagnosis not present

## 2016-03-19 DIAGNOSIS — E669 Obesity, unspecified: Secondary | ICD-10-CM | POA: Diagnosis not present

## 2016-03-19 DIAGNOSIS — N182 Chronic kidney disease, stage 2 (mild): Secondary | ICD-10-CM | POA: Diagnosis not present

## 2016-03-20 DIAGNOSIS — Z8673 Personal history of transient ischemic attack (TIA), and cerebral infarction without residual deficits: Secondary | ICD-10-CM | POA: Diagnosis not present

## 2016-03-20 DIAGNOSIS — I4891 Unspecified atrial fibrillation: Secondary | ICD-10-CM | POA: Diagnosis not present

## 2016-03-20 DIAGNOSIS — E119 Type 2 diabetes mellitus without complications: Secondary | ICD-10-CM | POA: Diagnosis not present

## 2016-03-20 DIAGNOSIS — E669 Obesity, unspecified: Secondary | ICD-10-CM | POA: Diagnosis not present

## 2016-03-20 DIAGNOSIS — N182 Chronic kidney disease, stage 2 (mild): Secondary | ICD-10-CM | POA: Diagnosis not present

## 2016-03-20 DIAGNOSIS — I129 Hypertensive chronic kidney disease with stage 1 through stage 4 chronic kidney disease, or unspecified chronic kidney disease: Secondary | ICD-10-CM | POA: Diagnosis not present

## 2016-03-21 DIAGNOSIS — I4891 Unspecified atrial fibrillation: Secondary | ICD-10-CM | POA: Diagnosis not present

## 2016-03-21 DIAGNOSIS — E669 Obesity, unspecified: Secondary | ICD-10-CM | POA: Diagnosis not present

## 2016-03-21 DIAGNOSIS — E119 Type 2 diabetes mellitus without complications: Secondary | ICD-10-CM | POA: Diagnosis not present

## 2016-03-21 DIAGNOSIS — I129 Hypertensive chronic kidney disease with stage 1 through stage 4 chronic kidney disease, or unspecified chronic kidney disease: Secondary | ICD-10-CM | POA: Diagnosis not present

## 2016-03-21 DIAGNOSIS — Z8673 Personal history of transient ischemic attack (TIA), and cerebral infarction without residual deficits: Secondary | ICD-10-CM | POA: Diagnosis not present

## 2016-03-21 DIAGNOSIS — N182 Chronic kidney disease, stage 2 (mild): Secondary | ICD-10-CM | POA: Diagnosis not present

## 2016-03-21 IMAGING — MR MR HEAD W/O CM
9 of 11 series · 29 of 48 positions shown · non-contrast
Comparison: CT head without contrast 01/28/2014. MRI brain
08/16/2008

CLINICAL DATA: Right-sided headaches.  Personal history of strokes.

EXAM:
MRI HEAD WITHOUT CONTRAST
TECHNIQUE: Multiplanar, multiecho pulse sequences of the brain and surrounding
structures were obtained without intravenous contrast.

[Series 2: FLAIR · sagittal · 5.0mm · 0.47mm/px · 1 of 25 slices shown (1 of 2)]
[im 1/25]
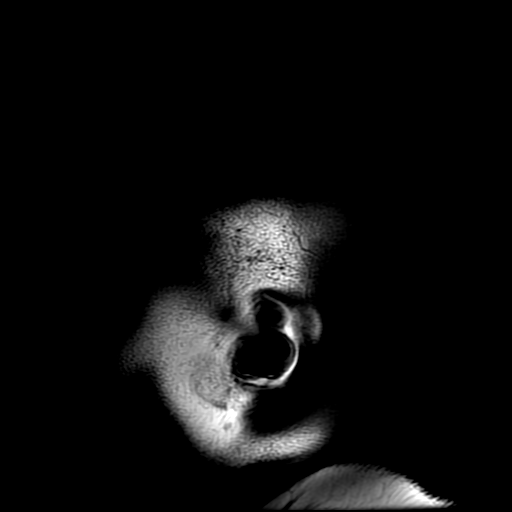

[Series 4: DWI · axial · 5.0mm · 1.02mm/px · z∈[-66,+84]mm · 4 of 62 slices shown (1 of 4)]
[im 1/62]
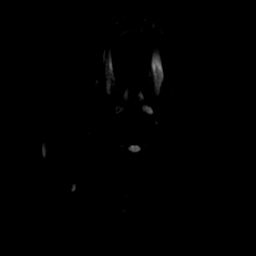
[im 21/62]
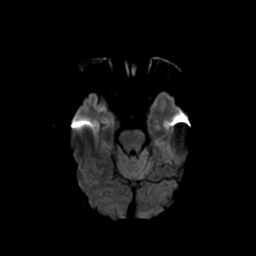
[im 41/62]
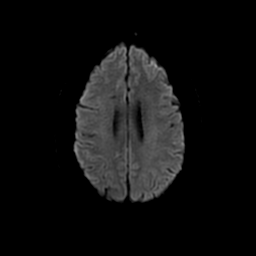
[im 62/62]
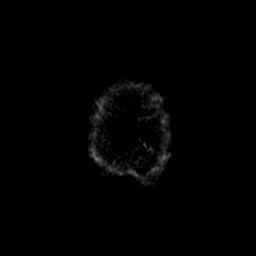

[Series 5: T2 · axial · 5.0mm · 0.47mm/px · z∈[-66,+78]mm · 2 of 25 slices shown]
[im 1/25]
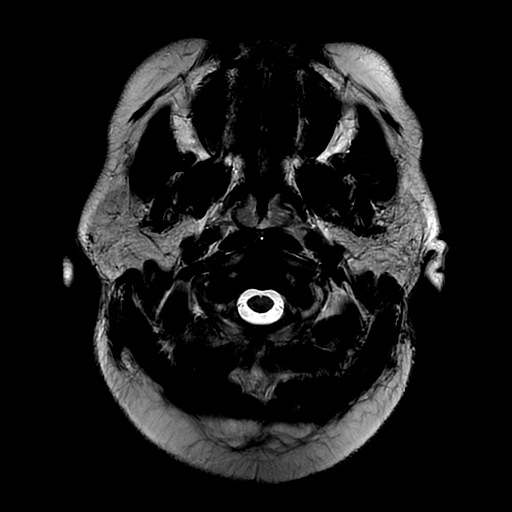
[im 25/25]
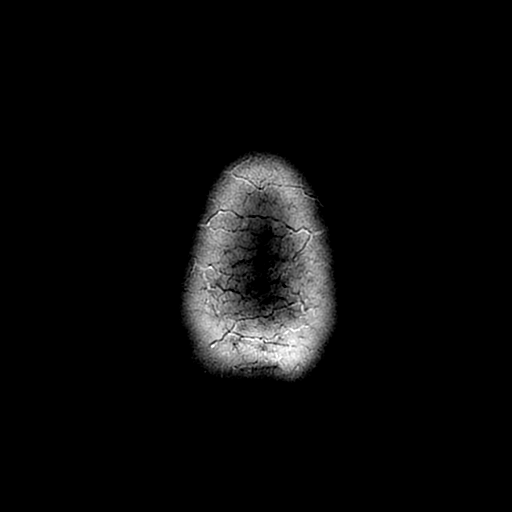

[Series 6: FLAIR · axial · 5.0mm · 0.47mm/px · z∈[-66,+78]mm · 2 of 25 slices shown (2 of 2)]
[im 1/25]
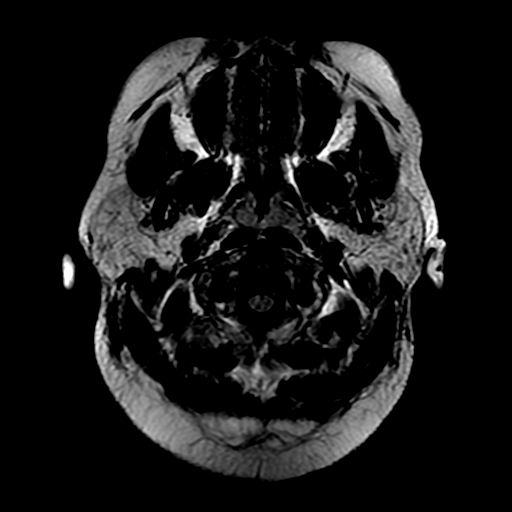
[im 25/25]
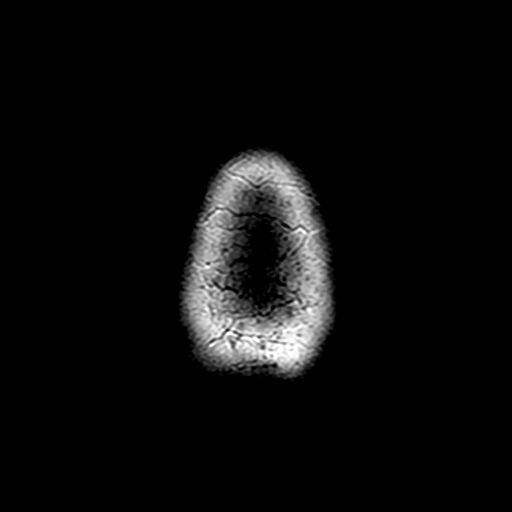

[Series 7: DWI · coronal · 5.0mm · 1.02mm/px · 5 of 74 slices shown (2 of 4)]
[im 1/74]
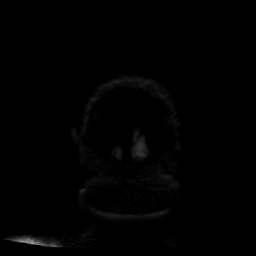
[im 19/74]
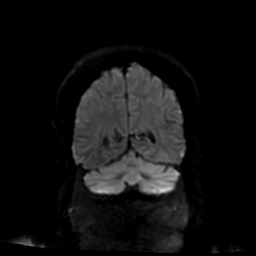
[im 37/74]
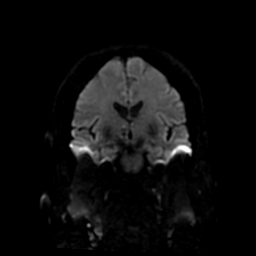
[im 55/74]
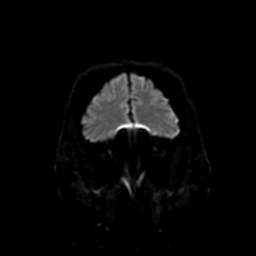
[im 74/74]
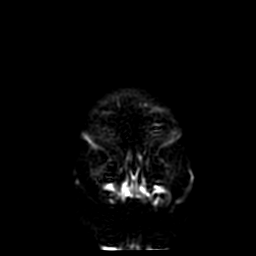

[Series 8: (person_name) · axial · 3.6mm · 0.47mm/px · z∈[-73,+70]mm · 8 of 160 slices shown]
[im 1/160]
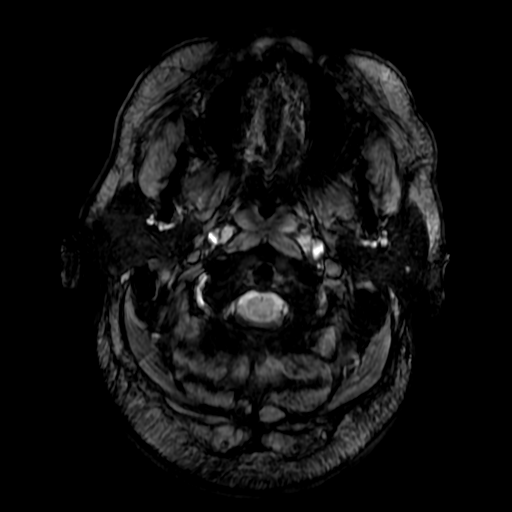
[im 32/160]
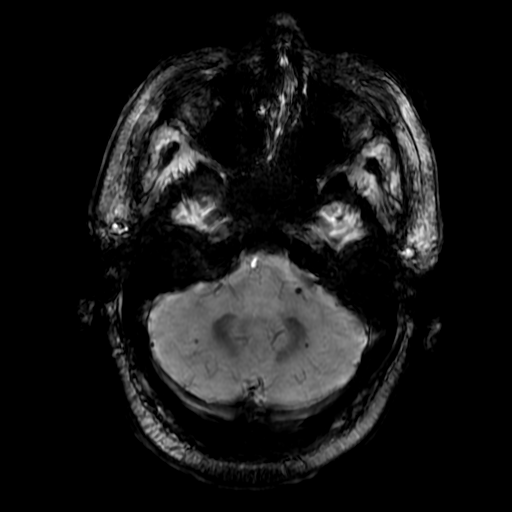
[im 48/160]
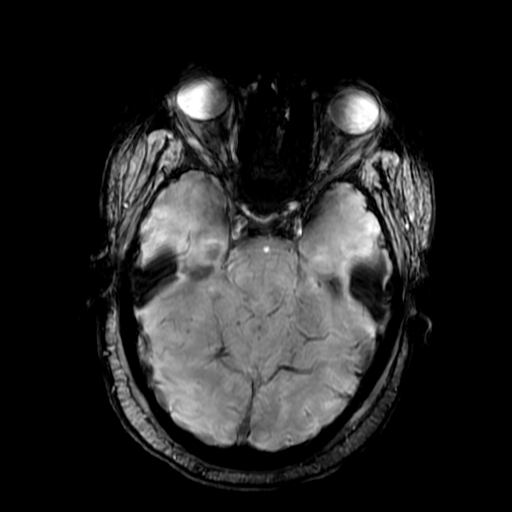
[im 64/160]
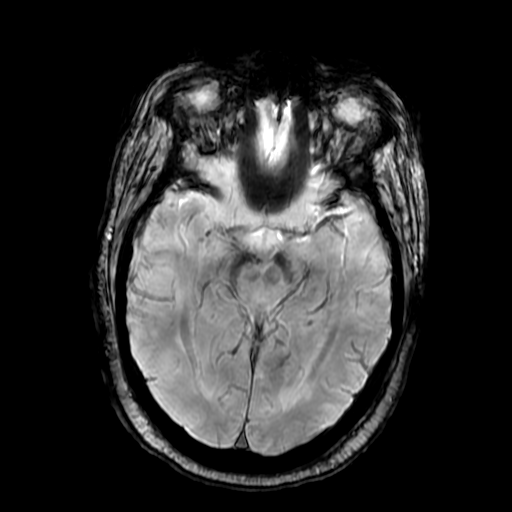
[im 96/160]
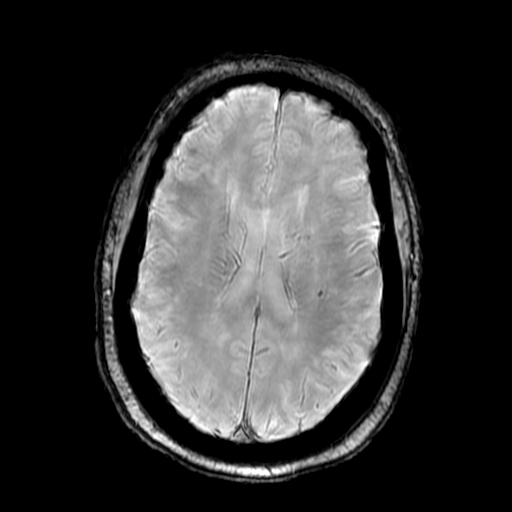
[im 112/160]
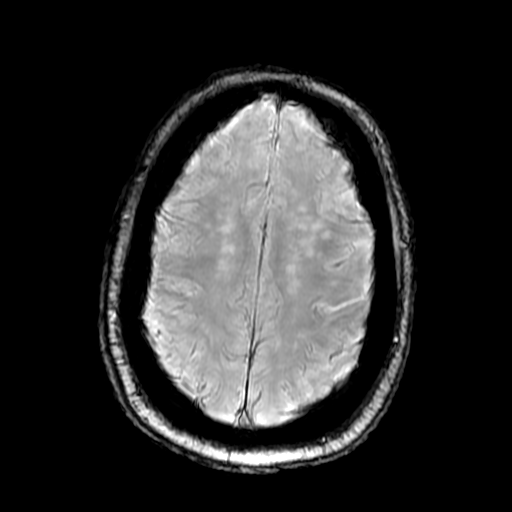
[im 128/160]
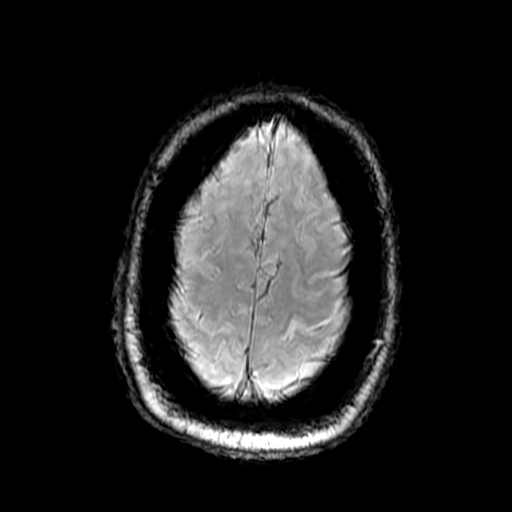
[im 160/160]
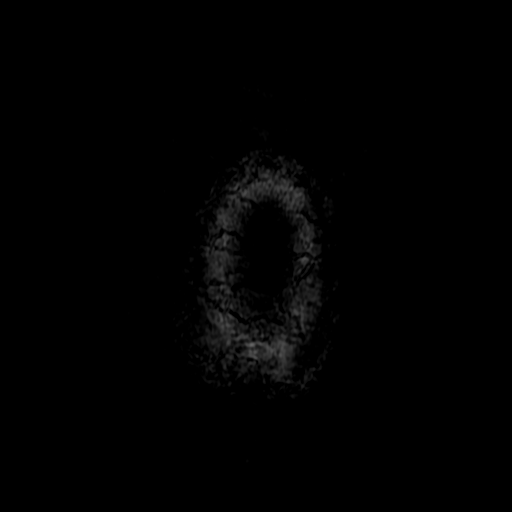

[Series 10: T2 post-contrast · coronal · 5.0mm · 0.47mm/px · 2 of 30 slices shown]
[im 1/30]
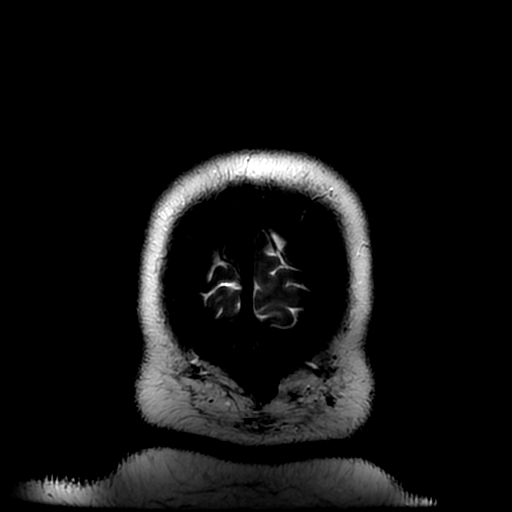
[im 30/30]
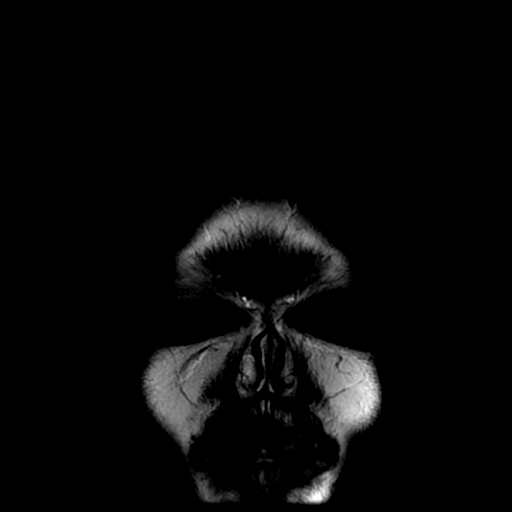

[Series 400: DWI · axial · 5.0mm · 1.02mm/px · z∈[-66,+84]mm · 2 of 31 slices shown (3 of 4)]
[im 1/31]
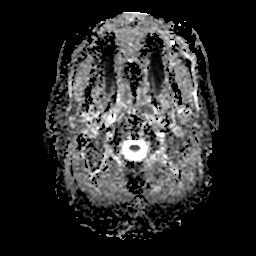
[im 31/31]
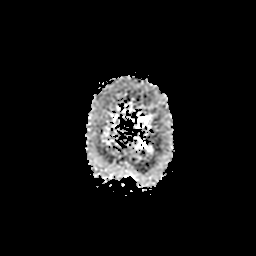

[Series 700: DWI · coronal · 5.0mm · 1.02mm/px · 3 of 37 slices shown (4 of 4)]
[im 1/37]
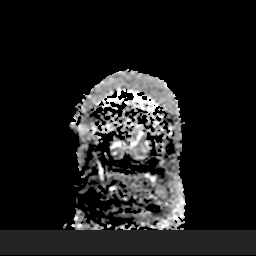
[im 19/37]
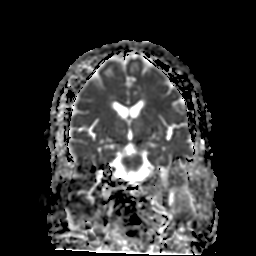
[im 37/37]
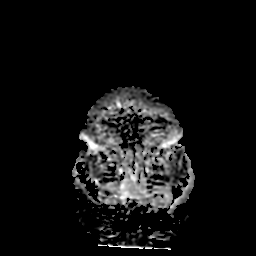

[29 of 48 positions shown; findings below may reference images not displayed]

FINDINGS: The diffusion-weighted images demonstrate no acute or subacute
infarct. Remote hemorrhagic infarcts are evident within the basal
ganglia bilaterally. Multiple other punctate foci of susceptibility
are evident within the temporal and inferior parietal lobes
bilaterally. Periventricular and subcortical white matter disease
has progressed bilaterally. Remote brainstem infarcts are evident.

Flow is present in the major intracranial arteries. The globes and
orbits are intact. The paranasal sinuses and mastoid air cells are
clear.
IMPRESSION: 1. Progression of diffuse periventricular and subcortical white
matter disease bilaterally, compatible with chronic microvascular
ischemia.
2. Multiple remote hemorrhagic lacunar infarcts within the basal
ganglia.
3. Additional foci of susceptibility within the temporal and
inferior parietal lobes bilaterally. This raises concern for a
vasculitis, specifically amyloid angiopathy.
4. No acute intracranial abnormality.
5. Remote infarcts of the thalami bilaterally as well as the
brainstem.

## 2016-03-25 DIAGNOSIS — E669 Obesity, unspecified: Secondary | ICD-10-CM | POA: Diagnosis not present

## 2016-03-25 DIAGNOSIS — N182 Chronic kidney disease, stage 2 (mild): Secondary | ICD-10-CM | POA: Diagnosis not present

## 2016-03-25 DIAGNOSIS — E119 Type 2 diabetes mellitus without complications: Secondary | ICD-10-CM | POA: Diagnosis not present

## 2016-03-25 DIAGNOSIS — I129 Hypertensive chronic kidney disease with stage 1 through stage 4 chronic kidney disease, or unspecified chronic kidney disease: Secondary | ICD-10-CM | POA: Diagnosis not present

## 2016-03-25 DIAGNOSIS — I4891 Unspecified atrial fibrillation: Secondary | ICD-10-CM | POA: Diagnosis not present

## 2016-03-25 DIAGNOSIS — Z8673 Personal history of transient ischemic attack (TIA), and cerebral infarction without residual deficits: Secondary | ICD-10-CM | POA: Diagnosis not present

## 2016-03-27 ENCOUNTER — Encounter: Payer: Self-pay | Admitting: Pharmacist

## 2016-03-27 ENCOUNTER — Ambulatory Visit (INDEPENDENT_AMBULATORY_CARE_PROVIDER_SITE_OTHER): Payer: Medicare Other | Admitting: Pharmacist

## 2016-03-27 DIAGNOSIS — E118 Type 2 diabetes mellitus with unspecified complications: Secondary | ICD-10-CM

## 2016-03-27 DIAGNOSIS — E785 Hyperlipidemia, unspecified: Secondary | ICD-10-CM

## 2016-03-27 DIAGNOSIS — E1165 Type 2 diabetes mellitus with hyperglycemia: Secondary | ICD-10-CM | POA: Diagnosis not present

## 2016-03-27 DIAGNOSIS — E1169 Type 2 diabetes mellitus with other specified complication: Secondary | ICD-10-CM

## 2016-03-27 DIAGNOSIS — I10 Essential (primary) hypertension: Secondary | ICD-10-CM | POA: Diagnosis not present

## 2016-03-27 DIAGNOSIS — Z794 Long term (current) use of insulin: Secondary | ICD-10-CM

## 2016-03-27 DIAGNOSIS — I639 Cerebral infarction, unspecified: Secondary | ICD-10-CM | POA: Diagnosis not present

## 2016-03-27 DIAGNOSIS — IMO0002 Reserved for concepts with insufficient information to code with codable children: Secondary | ICD-10-CM

## 2016-03-27 LAB — LDL CHOLESTEROL, DIRECT: LDL DIRECT: 51 mg/dL (ref <130)

## 2016-03-27 MED ORDER — INSULIN ASPART 100 UNIT/ML ~~LOC~~ SOLN: SUBCUTANEOUS | 2 refills | Status: DC

## 2016-03-27 MED ORDER — INSULIN GLARGINE 100 UNIT/ML SOLOSTAR PEN
65.0000 [IU] | PEN_INJECTOR | Freq: Every day | SUBCUTANEOUS | 1 refills | Status: DC
Start: 2016-03-27 — End: 2016-07-22

## 2016-03-27 NOTE — Patient Instructions (Addendum)
Thank you for coming to see [place] today!   In the mornings with your breakfast shake: Lantus 65 units and novolog 20 units  In the afternoon with your lunchtime meal: Victoza 1.8 mg and novolog 30 units  Do not take any novolog in the evening.  Start taking aspirin 81 mg daily until your see your neurologist. Ask your neurologist about the aspirin at the upcoming appointment. Start walking on mondays and fridays when the children go back to school. Our goal is for you to weigh 175 pounds - keep up the good work!  Make sure you pick up your lisinopril today and make an appointment with the eye doctor If your fasting blood sugars in the mornings are in the low 100s or are BELOW 100, call the doctor's office.  We will see you again in about 4 weeks (late September). Bring your blood sugar readings to this appointment.

## 2016-03-27 NOTE — Assessment & Plan Note (Signed)
Hypertension longstanding currently uncontrolled.  Patient denies adherence with medication. Control is suboptimal due to nonadherence to lisinopril x2 days.

## 2016-03-27 NOTE — Assessment & Plan Note (Signed)
Diabetes longstanding currently uncontrolled. Patient denies hypoglycemic events and is able to verbalize appropriate hypoglycemia management plan. Patient denies adherence with medication (novolog, lisinopril x 2days). Control is suboptimal due to inadequate insulin dose, lack of physical acitivity. Increase lantus to 65 units every morning. Change novolog to 20 units with breakfast and 30 units with lunch. Continue victoza 1.8 mg daily. Counseled on importance of daily foot checks and proper foot wear. Next A1C anticipated October, 2017.

## 2016-03-27 NOTE — Progress Notes (Addendum)
    S:    Patient arrives in good spirits, ambulating with assistance from a cane.  Presents for diabetes evaluation, education, and management at the request of Dr. Avon Gully. Patient was referred on 03/18/16.  Patient was last seen by Primary Care Provider on 03/18/16.  Patient reports nonadherence with some medications. Pt reports taking novolog 20 units TID on Mondays, Wednesdays, and Fridays. She has also been out of lisinopril x2 days Current diabetes medications include: Lantus 55 nightly, novolog 20 units TID MWF, victoza 1.8 mg nightly. Current hypertension medications include: amlodipine, lisinopril, spironolactone, clonidine  Patient denies hypoglycemic events.  Patient reported dietary habits: Eats 1.5 meals/day Breakfast:"light and fit" chocoloate protein shake (11am)  Lunch:sandwich (2-3 pm) Dinner:diet soda, no real meal Drinks: diet soda  Patient reported exercise habits: walks 1x/week. 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   Patient reports nocturia x 6 episodes each night Patient denies neuropathy, however endorses cramps in her toes 2x/week.  Patient reports visual changes. Patient reports self foot exams once weekly. She wears house slippers with a soft sole in the house.   Pt reports she does get light headed when rising quickly from a seated position since re-initiation of clonidine and dose increase of spironolactone. She states rising slowly prevents this dizziness.  Pt complains of headaches lasting 5-10 minutes 2x/week for which she sees a neurologist - next appt in 1 month. She also complains of back pain. Pt reports no new or worsening deficits since last stroke in July, 2017.   She reports her mood has greatly improved since starting zoloft and reports she is tolerating it well.  O:  Lab Results  Component Value Date   HGBA1C 9.3 (H) 02/17/2016   Vitals:   03/27/16 1537 03/27/16 1559  BP: (!) 154/78 (!) 173/90  Pulse: 83 81    Home fasting CBG: 210-268  10 year ASCVD risk: 34%.  A/P:  Diabetes longstanding currently uncontrolled. Patient denies hypoglycemic events and is able to verbalize appropriate hypoglycemia management plan. Patient denies adherence with medication (novolog, lisinopril x 2days). Control is suboptimal due to inadequate insulin dose, lack of physical acitivity. Increase lantus to 65 units every morning. Change novolog to 20 units with breakfast and 30 units with lunch. Continue victoza 1.8 mg daily. Counseled on importance of daily foot checks and proper foot wear. Next A1C anticipated October, 2017.    ASCVD risk greater than 7.5%. Restarted Aspirin 81 mg and Continued atorvastatin 80 mg. Check direct LDL today.   Hypertension longstanding currently uncontrolled.  Patient denies adherence with medication. Control is suboptimal due to nonadherence to lisinopril x2 days.   Written patient instructions provided.  Total time in face to face counseling 30 minutes.   Follow up in Pharmacist Clinic Visit 4 weeks.   Patient seen with Mechele Dawley, PharmD Candidate and Deirdre Pippins, PharmD Resident and Bennye Alm, PharmD Resident.     LDL cholesterol - 51 appears adherent and tolerating Atorvastatin 80mg  daily.  No change in therapy suggested.

## 2016-03-28 NOTE — Progress Notes (Signed)
Patient ID: [name], female   DOB: [birth year], 60 y.o.   MRN: [id number] Reviewed: Agree with Dr. Graylin Shiver documentation and management.

## 2016-04-21 ENCOUNTER — Ambulatory Visit (INDEPENDENT_AMBULATORY_CARE_PROVIDER_SITE_OTHER): Payer: Medicare Other | Admitting: Internal Medicine

## 2016-04-21 ENCOUNTER — Encounter: Payer: Self-pay | Admitting: Internal Medicine

## 2016-04-21 DIAGNOSIS — F329 Major depressive disorder, single episode, unspecified: Secondary | ICD-10-CM | POA: Diagnosis not present

## 2016-04-21 DIAGNOSIS — I639 Cerebral infarction, unspecified: Secondary | ICD-10-CM

## 2016-04-21 DIAGNOSIS — Z23 Encounter for immunization: Secondary | ICD-10-CM

## 2016-04-21 DIAGNOSIS — F32A Depression, unspecified: Secondary | ICD-10-CM

## 2016-04-21 NOTE — Patient Instructions (Signed)
It was nice seeing you again today Ms. Juday!  Please continue to take your medications as you have been.   If you have any questions or concerns, please feel free to call the clinic.   Be well,  Dr. Avon Gully

## 2016-04-21 NOTE — Progress Notes (Signed)
   Subjective:    Patient ID: [name], female    DOB: [birth year], 60 y.o.   MRN: [id number]  HPI  Patient presents for depression f/u.   Depression Patient reports improved mood on Zoloft. Says everything is "smooth sailing" now and she feels much better than at her last visit before she started taking Zoloft. Reports no new stressors. Her children are still arguing with each other, however patient says she has realized there is nothing she can do about this, and has decided not to let it affect her. Denies sadness. Reports interest in socializing, particularly through Facebook. Remains very active in her church with a good support system in place. Denies HI/SI.  Review of Systems See HPI.     Objective:   Physical Exam  Constitutional: She is oriented to person, place, and time. She appears well-developed and well-nourished.  HENT:  Head: Normocephalic and atraumatic.  Pulmonary/Chest: Effort normal. No respiratory distress.  Neurological: She is alert and oriented to person, place, and time.  Psychiatric: She has a normal mood and affect. Her behavior is normal.      Assessment & Plan:  Depression Improved. Would like to stay on Zoloft 50mg  qd. No HI/SI.  - Refilled Zoloft 50mg    Adin Hector, MD, MPH PGY-2 [facility] Pager [phone]

## 2016-04-21 NOTE — Assessment & Plan Note (Signed)
Improved. Would like to stay on Zoloft 50mg  qd. No HI/SI.  - Refilled Zoloft 50mg 

## 2016-04-23 ENCOUNTER — Ambulatory Visit (INDEPENDENT_AMBULATORY_CARE_PROVIDER_SITE_OTHER): Payer: Medicare Other | Admitting: Neurology

## 2016-04-23 ENCOUNTER — Encounter: Payer: Self-pay | Admitting: Neurology

## 2016-04-23 VITALS — BP 152/72 | HR 65 | Ht 63.0 in | Wt 248.0 lb

## 2016-04-23 DIAGNOSIS — E1169 Type 2 diabetes mellitus with other specified complication: Secondary | ICD-10-CM

## 2016-04-23 DIAGNOSIS — E785 Hyperlipidemia, unspecified: Secondary | ICD-10-CM

## 2016-04-23 DIAGNOSIS — Z794 Long term (current) use of insulin: Secondary | ICD-10-CM | POA: Diagnosis not present

## 2016-04-23 DIAGNOSIS — I63411 Cerebral infarction due to embolism of right middle cerebral artery: Secondary | ICD-10-CM | POA: Diagnosis not present

## 2016-04-23 DIAGNOSIS — E1159 Type 2 diabetes mellitus with other circulatory complications: Secondary | ICD-10-CM

## 2016-04-23 DIAGNOSIS — I639 Cerebral infarction, unspecified: Secondary | ICD-10-CM | POA: Diagnosis not present

## 2016-04-23 DIAGNOSIS — I480 Paroxysmal atrial fibrillation: Secondary | ICD-10-CM

## 2016-04-23 DIAGNOSIS — I10 Essential (primary) hypertension: Secondary | ICD-10-CM | POA: Diagnosis not present

## 2016-04-23 NOTE — Progress Notes (Signed)
STROKE NEUROLOGY FOLLOW UP NOTE  NAME: [name] DOB: 1955/12/09  REASON FOR VISIT: stroke follow up HISTORY FROM: pt and chart  Today we had the pleasure of seeing [name] in follow-up at our Neurology Clinic. Pt was accompanied by no one.   History Summary [name] is a 60 y.o. female with history of atrial fibrillation (patient stopped Xarelto secondary to cost), hypertension, hyperlipidemia, previous strokes, and diabetes mellitus as well as LBP admitted on 02/16/16 for palpitations and mild left pronator drift. MRI showed right external capsule acute infarct as well as remote lacunar infarcts at b/l thalami and right midbrain and left pons. MRA, CUS, TTE unremarkable, LDL 88 and A1C 9.3. She was restarted on Xarelto and put on lipitor 80mg . She was discharged in good condition.   Interval History During the interval time, the patient has been doing better.  Glucose still fluctuate 120-320. Now following up with endocrinologist. BP still a little high, today 152/72. Not checking BP at home. On Xarelto, compliant with medication. Not able to do more exercise due to LBP. Has appointment with cardiology in 06/2016.   REVIEW OF SYSTEMS: Full 14 system review of systems performed and notable only for those listed below and in HPI above, all others are negative:  Constitutional:   Cardiovascular: CP, palpitations Ear/Nose/Throat:  Ringing in ears Skin:  Eyes:  Blurry vision Respiratory:  SOB Gastroitestinal:   Genitourinary:  Hematology/Lymphatic:   Endocrine:  Musculoskeletal:   Allergy/Immunology:   Neurological:  Memory loss, HA Psychiatric:  Sleep:   The following represents the patient's updated allergies and side effects list: Allergies  Allergen Reactions  . Propoxyphene N-Acetaminophen Hives, Itching, Swelling and Other (See Comments)    REACTION: Hallucinations  . Glipizide Nausea And Vomiting  . Metformin And Related Diarrhea    The  neurologically relevant items on the patient's problem list were reviewed on today's visit.  Neurologic Examination  A problem focused neurological exam (12 or more points of the single system neurologic examination, vital signs counts as 1 point, cranial nerves count for 8 points) was performed.  Blood pressure (!) 152/72, pulse 65, height 5\' 3"  (1.6 m), weight 248 lb (112.5 kg).  General - obese, well developed, in no apparent distress.  Ophthalmologic - Fundi not visualized due to eye movement.  Cardiovascular - Regular rate and rhythm with no murmur, not in afib.  Mental Status -  Level of arousal and orientation to time, place, and person were intact. Language including expression, naming, repetition, comprehension was assessed and found intact, but mild psychomotor slowing Fund of Knowledge was assessed and was intact.  Cranial Nerves II - XII - II - Visual field intact OU. III, IV, VI - Extraocular movements intact. V - Facial sensation intact bilaterally. VII - Facial movement intact bilaterally. VIII - Hearing & vestibular intact bilaterally. X - Palate elevates symmetrically. XI - Chin turning & shoulder shrug intact bilaterally. XII - Tongue protrusion intact.  Motor Strength - The patient's strength was symmetrical in all extremities with BLE 4+/5 proximally and pronator drift was absent.  Bulk was normal and fasciculations were absent.   Motor Tone - Muscle tone was assessed at the neck and appendages and was normal.  Reflexes - The patient's reflexes were 1+ in all extremities and she had no pathological reflexes.  Sensory - Light touch, temperature/pinprick were assessed and were normal.    Coordination - The patient had normal movements in the hands with  no ataxia or dysmetria.  Tremor was absent.  Gait and Station -  Walk with cane, slow and small stride, stooped posturing due to LBP.   Functional score  mRS = 2   0 - No symptoms.   1 - No significant  disability. Able to carry out all usual activities, despite some symptoms.   2 - Slight disability. Able to look after own affairs without assistance, but unable to carry out all previous activities.   3 - Moderate disability. Requires some help, but able to walk unassisted.   4 - Moderately severe disability. Unable to attend to own bodily needs without assistance, and unable to walk unassisted.   5 - Severe disability. Requires constant nursing care and attention, bedridden, incontinent.   6 - Dead.   NIH Stroke Scale   Level Of Consciousness 0=Alert; keenly responsive 1=Not alert, but arousable by minor stimulation 2=Not alert, requires repeated stimulation 3=Responds only with reflex movements 0  LOC Questions to Month and Age 44=Answers both questions correctly 1=Answers one question correctly 2=Answers neither question correctly 0  LOC Commands      -Open/Close eyes     -Open/close grip 0=Performs both tasks correctly 1=Performs one task correctly 2=Performs neighter task correctly 0  Best Gaze 0=Normal 1=Partial gaze palsy 2=Forced deviation, or total gaze paresis 0  Visual 0=No visual loss 1=Partial hemianopia 2=Complete hemianopia 3=Bilateral hemianopia (blind including cortical blindness) 0  Facial Palsy 0=Normal symmetrical movement 1=Minor paralysis (asymmetry) 2=Partial paralysis (lower face) 3=Complete paralysis (upper and lower face) 0  Motor  0=No drift, limb holds posture for full 10 seconds 1=Drift, limb holds posture, no drift to bed 2=Some antigravity effort, cannot maintain posture, drifts to bed 3=No effort against gravity, limb falls 4=No movement Right Arm 0     Leg 1    Left Arm 0     Leg 1  Limb Ataxia 0=Absent 1=Present in one limb 2=Present in two limbs 0  Sensory 0=Normal 1=Mild to moderate sensory loss 2=Severe to total sensory loss 0  Best Language 0=No aphasia, normal 1=Mild to moderate aphasia 2=Mute, global aphasia 3=Mute,  global aphasia 0  Dysarthria 0=Normal 1=Mild to moderate 2=Severe, unintelligible or mute/anarthric 0  Extinction/Neglect 0=No abnormality 1=Extinction to bilateral simultaneous stimulation 2=Profound neglect 0  Total   2     Data reviewed: I personally reviewed the images and agree with the radiology interpretations.  Mri and Mra Head Wo Contrast 02/16/2016   1. 8 mm acute nonhemorrhagic linear infarct within the right external capsule.  2. Moderate diffuse periventricular and subcortical T2 changes bilaterally reflect the sequela of chronic microvascular ischemia. White matter changes extend into the brainstem  3. Remote lacunar infarcts of the thalami bilaterally and right paramedian midbrain.  4. Mild sinus disease.  5. No significant stenosis within the anterior circulation.  6. Mild narrowing of the distal left vertebral artery without a significant stenosis relative to the more distal vessels.   CUS - Bilateral: No evidence of ICA stenosis. Vertebral artery flow is antegrade.  TTE - Left ventricle: The cavity size was normal. There was moderate  concentric hypertrophy. Systolic function was normal. The  estimated ejection fraction was in the range of 60% to 65%. Wall  motion was normal; there were no regional wall motion  abnormalities. Doppler parameters are consistent with abnormal  left ventricular relaxation (grade 1 diastolic dysfunction).  Doppler parameters are consistent with elevated ventricular  end-diastolic filling pressure. - Aortic valve: Trileaflet; normal thickness leaflets.  Transvalvular velocity was within the normal range. There was no  stenosis. There was no regurgitation. - Aortic root: The aortic root was normal in size. - Left atrium: The atrium was normal in size. - Right ventricle: The cavity size was normal. Wall thickness was  normal. Systolic function was normal. - Right atrium: The atrium was normal in size. - Tricuspid  valve: There was no regurgitation. - Pulmonic valve: There was no regurgitation. - Pulmonary arteries: Systolic pressure was within the normal  range. - Inferior vena cava: The vessel was normal in size. - Pericardium, extracardiac: There was no pericardial effusion.  Component     Latest Ref Rng & Units 02/17/2016 03/27/2016  Cholesterol     0 - 200 mg/dL 145   Triglycerides     <150 mg/dL 123   HDL Cholesterol     >40 mg/dL 32 (L)   Total CHOL/HDL Ratio     RATIO 4.5   VLDL     0 - 40 mg/dL 25   LDL (calc)     0 - 99 mg/dL 88   Hemoglobin A1C     4.8 - 5.6 % 9.3 (H)   Mean Plasma Glucose     mg/dL 220   TSH     0.350 - 4.500 uIU/mL 1.862   Direct LDL     <130 mg/dL  51    Assessment: As you may recall, she is a 60 y.o. African American female with PMH of atrial fibrillation (patient stopped Xarelto secondary to cost), hypertension, hyperlipidemia, previous strokes, and diabetes mellitus as well as LBP admitted on 02/16/16 for right external capsule acute infarct as well as remote lacunar infarcts at b/l thalami and right midbrain and left pons. MRA, CUS, TTE unremarkable, LDL 88 and A1C 9.3. She was restarted on Xarelto and put on lipitor 80mg . During the interval time, the patient has been doing better.  Glucose still fluctuate 120-320. Now following up with endocrinologist. BP still a little high, today 152/72. Not checking BP at home. On Xarelto, compliant with medication.   Plan:  - continue Xarelto and lipitor for stroke prevention - Follow up with your primary care physician for stroke risk factor modification. Recommend maintain blood pressure goal <130/80, diabetes with hemoglobin A1c goal below 7.0% and lipids with LDL cholesterol goal below 70 mg/dL.  - check BP and glucose at home and record and bring over to PCP for medication adjustment if needed - diabetic diet and regular exercise  - follow up with endocrinologist for better DM control - follow up in 4 months.     I spent more than 25 minutes of face to face time with the patient. Greater than 50% of time was spent in counseling and coordination of care. We discussed medication compliance, BP and glucose monitoring at home, and follow up with endocrinologist.   No orders of the defined types were placed in this encounter.   Meds ordered this encounter  Medications  . DISCONTD: spironolactone (ALDACTONE) 25 MG tablet  . calcipotriene (DOVONOX) 0.005 % cream    There are no Patient Instructions on file for this visit.  Rosalin Hawking, MD PhD [facility] [address], [place], [place] [address] [phone]

## 2016-04-23 NOTE — Patient Instructions (Signed)
-   continue Xarelto and lipitor for stroke prevention - Follow up with your primary care physician for stroke risk factor modification. Recommend maintain blood pressure goal <130/80, diabetes with hemoglobin A1c goal below 7.0% and lipids with LDL cholesterol goal below 70 mg/dL.  - check BP and glucose at home and record and bring over to PCP for medication adjustment if needed - diabetic diet and regular exercise  - follow up with endocrinologist for better DM control - follow up in 4 months.

## 2016-05-27 ENCOUNTER — Other Ambulatory Visit: Payer: Self-pay | Admitting: Internal Medicine

## 2016-06-12 ENCOUNTER — Other Ambulatory Visit: Payer: Self-pay | Admitting: Internal Medicine

## 2016-06-12 DIAGNOSIS — I10 Essential (primary) hypertension: Secondary | ICD-10-CM

## 2016-06-12 NOTE — Telephone Encounter (Signed)
Needs refill on lisinopril.  [facility] on [address]

## 2016-06-13 ENCOUNTER — Other Ambulatory Visit: Payer: Self-pay | Admitting: Internal Medicine

## 2016-06-13 DIAGNOSIS — I10 Essential (primary) hypertension: Secondary | ICD-10-CM

## 2016-06-13 MED ORDER — LISINOPRIL 40 MG PO TABS: ORAL_TABLET | ORAL | 0 refills | Status: DC

## 2016-06-13 MED ORDER — LISINOPRIL 40 MG PO TABS: ORAL_TABLET | ORAL | 11 refills | Status: DC

## 2016-06-23 NOTE — Progress Notes (Signed)
Patient ID: [name], female   DOB: 1955/09/04, 60 y.o.   MRN: [id number]     Cardiology Office Note   Date:  06/25/2016   ID:  [name] Apr 17, 1956, MRN [id number]  PCP:  Adin Hector, MD  Cardiologist:   Jenkins Rouge, MD   Chief Complaint  Patient presents with  . Hypertension      History of Present Illness: [name] is a 60 y.o. female f/u atypical chest pain .  She has significant HTN and DM both poorly controlled due to body habitus and compliance issues  A1c in March was 9.6 She has atypical chest pain.  Sharp , right and left sided weekly.  Not always exertional.  Also indicates history of PAF although I cannot document with Epic records  And ECGls in Epic show NSR.  Sedentary walks with cane.  Compliant with meds   Reviewed echo from July 2015    Study Conclusions  - Left ventricle: The cavity size was normal. Systolic function was normal. The estimated ejection fraction was in the range of 55% to 60%. Wall motion was normal; there were no regional wall motion abnormalities. - Atrial septum: No defect or patent foramen ovale was identified.  Seen by Dr Doylene Canard 07/2014  For evaluation after stroke.  MRI with lacunar infarcts and possible amyloid angiopathy  11/28/14:  Myovue  Overall Impression:  Low risk stress nuclear study with Medium in size, mild in intensity defects  in the mid and basal inferolateral wall that appear reversible.  There is a small in size, small in intensity fixed defect in the apical anterior and apical walls.  These defects most likely represent shifting breast attenuation artifact.  The RAW images demonstrate large pendulous breasts overlying the cardiac silhouette. Cannot rule out a small area of ischemia in the inferolateral wall.   LV Ejection Fraction: 59%.  LV Wall Motion:  NL LV Function; NL Wall Motion  History of afib. Was on coumadin. Then changed to xarelto but stopped for 2 months Children told her  to after seeing TV commercials.    Primary resumed it on 10/12/15     Past Medical History:  Diagnosis Date  . Atrial fibrillation ([place])   . 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 Diabetes mellitus    type 2  . Hypercholesteremia   . Hypertension   . Hypertension    Hypertensive urgency 05/2006    Past Surgical History:  Procedure Laterality Date  . DILATION AND CURETTAGE OF UTERUS  1976  . MENISCUS REPAIR  03/09   right knee  . TONSILECTOMY, ADENOIDECTOMY, BILATERAL MYRINGOTOMY AND TUBES  age 55  . TONSILLECTOMY       Current Outpatient Prescriptions  Medication Sig Dispense Refill  . albuterol (PROVENTIL HFA;VENTOLIN HFA) 108 (90 Base) MCG/ACT inhaler Inhale 2 puffs into the lungs every 6 (six) hours as needed for wheezing or shortness of breath. 1 Inhaler 2  . amLODipine (NORVASC) 10 MG tablet Take 1 tablet (10 mg total) by mouth daily. 30 tablet 0  . amLODipine (NORVASC) 10 MG tablet TAKE 1 TABLET(10 MG) BY MOUTH DAILY 30 tablet 0  . atorvastatin (LIPITOR) 80 MG tablet Take 1 tablet (80 mg total) by mouth daily at 6 PM. 30 tablet 5  . calcipotriene (DOVONOX) 0.005 % cream     . cloNIDine (CATAPRES) 0.2 MG tablet Take 1 tablet (0.2 mg total) by mouth 2 (two)  times daily. 60 tablet 5  . glucose blood test strip Use as instructed 120 each 12  . insulin aspart (NOVOLOG) 100 UNIT/ML injection Inject 20 units under the skin with breakfast and 30 units under the skin with lunch (Patient taking differently: Inject 20 units under the skin with breakfast and 30 units under the skin with lunch) 10 mL 2  . Insulin Glargine (LANTUS) 100 UNIT/ML Solostar Pen Inject 65 Units into the skin daily. 15 mL 1  . Liraglutide (VICTOZA) 18 MG/3ML SOPN Inject 0.3 mLs (1.8 mg total) into the skin daily. (Patient taking differently: Inject 1.8 mg into the skin at bedtime. ) 6 mL 2  . lisinopril (PRINIVIL,ZESTRIL) 40 MG tablet TAKE 1 TABLET(40 MG) BY MOUTH DAILY 30  tablet 11  . rivaroxaban (XARELTO) 20 MG TABS tablet Take 1 tablet (20 mg total) by mouth daily with supper. 30 tablet 11  . sertraline (ZOLOFT) 50 MG tablet Take 1 tablet (50 mg total) by mouth daily. 30 tablet 3  . spironolactone (ALDACTONE) 50 MG tablet Take 1 tablet (50 mg total) by mouth daily. 30 tablet 5  . omeprazole (PRILOSEC) 20 MG capsule Take 1 capsule (20 mg total) by mouth 2 (two) times daily before a meal. (Patient not taking: Reported on 06/25/2016) 60 capsule 2   No current facility-administered medications for this visit.     Allergies:   Propoxyphene n-acetaminophen; Glipizide; and Metformin and related    Social History:  The patient  reports that she has never smoked. She has never used smokeless tobacco. She reports that she does not drink alcohol or use drugs.   Family History:  The patient's family history includes Diabetes in her father; Heart failure in her mother; Hypertension in her father; Lung cancer in her sister; Stroke in her brother.    ROS:  Please see the history of present illness.   Otherwise, review of systems are positive for none.   All other systems are reviewed and negative.    PHYSICAL EXAM: VS:  BP 104/70   Pulse 70   Ht 5\' 2"  (1.575 m)   Wt 109.8 kg (242 lb)   SpO2 98%   BMI 44.26 kg/m  , BMI Body mass index is 44.26 kg/m. Obese black female  HEENT: normal  Neck: no JVD, carotid bruits, or masses Cardiac:  RRR; no murmurs, rubs, or gallops,no edema  Respiratory:  clear to auscultation bilaterally, normal work of breathing GI: soft, nontender, nondistended, + BS MS: no deformity or atrophy  Skin: warm and dry, no rash Neuro:  Strength and sensation are intact Psych: euthymic mood, full affect   EKG:   SR rate 87 low voltage 01/28/14  09/14/15  SR rate 77 normal ECG    Recent Labs: 01/06/2016: ALT 19 02/16/2016: Hemoglobin 13.2; Platelets 324 02/17/2016: BUN 12; Creatinine, Ser 0.79; Potassium 3.9; Sodium 139; TSH 1.862    Lipid  Panel    Component Value Date/Time   CHOL 145 02/17/2016 0307   TRIG 123 02/17/2016 0307   HDL 32 (L) 02/17/2016 0307   CHOLHDL 4.5 02/17/2016 0307   VLDL 25 02/17/2016 0307   LDLCALC 88 02/17/2016 0307   LDLDIRECT 51 03/27/2016 1614      Wt Readings from Last 3 Encounters:  06/25/16 109.8 kg (242 lb)  04/23/16 112.5 kg (248 lb)  04/21/16 113.7 kg (250 lb 9.6 oz)      Other studies Reviewed: Additional studies/ records that were reviewed today include: Dr Glennon Mac records and  Epic notes see HPI.    ASSESSMENT AND PLAN:  1.  HTN:    Well controlled when she takes her meds Discussed low salt diet weight loss and exercise importance  2. Chest Pain  low risk myovue  3. DM:  Discussed low carb diet.  Target hemoglobin A1c is 6.5 or less.  Continue current medications. 4/ Chol:  At goal 65 continue statin 5. CVA:  Likely more lacunar and amyloid angiopathy.  But with PAF on NOAC 6. PAF:   In NSR continue anticoagulation      Current medicines are reviewed at length with the patient today.  The patient does not have concerns regarding medicines.  The following changes have been made:  no change  Labs/ tests ordered today include:   No orders of the defined types were placed in this encounter.    Disposition:   FU with  Me in a year    Signed, Jenkins Rouge, MD  06/25/2016 9:39 AM    [facility] [address], [place], [place]  [address] Phone: [phone]; Fax: [phone]

## 2016-06-25 ENCOUNTER — Ambulatory Visit (INDEPENDENT_AMBULATORY_CARE_PROVIDER_SITE_OTHER): Payer: Medicare Other | Admitting: Cardiovascular Disease

## 2016-06-25 ENCOUNTER — Encounter: Payer: Self-pay | Admitting: Cardiovascular Disease

## 2016-06-25 VITALS — BP 104/70 | HR 70 | Ht 62.0 in | Wt 242.0 lb

## 2016-06-25 DIAGNOSIS — I480 Paroxysmal atrial fibrillation: Secondary | ICD-10-CM

## 2016-06-25 DIAGNOSIS — I639 Cerebral infarction, unspecified: Secondary | ICD-10-CM | POA: Diagnosis not present

## 2016-06-25 NOTE — Patient Instructions (Signed)
Medication Instructions:  Your physician recommends that you continue on your current medications as directed. Please refer to the Current Medication list given to you today.  Labwork: None  Testing/Procedures: None  Follow-Up: Your physician wants you to follow-up in: 1 year with Dr. Nishan.  You will receive a reminder letter in the mail two months in advance. If you don't receive a letter, please call our office to schedule the follow-up appointment.   Any Other Special Instructions Will Be Listed Below (If Applicable).     If you need a refill on your cardiac medications before your next appointment, please call your pharmacy.   

## 2016-07-22 ENCOUNTER — Other Ambulatory Visit: Payer: Self-pay | Admitting: Family Medicine

## 2016-07-22 DIAGNOSIS — Z794 Long term (current) use of insulin: Principal | ICD-10-CM

## 2016-07-22 DIAGNOSIS — E1165 Type 2 diabetes mellitus with hyperglycemia: Secondary | ICD-10-CM

## 2016-07-22 DIAGNOSIS — IMO0002 Reserved for concepts with insufficient information to code with codable children: Secondary | ICD-10-CM

## 2016-07-22 DIAGNOSIS — E118 Type 2 diabetes mellitus with unspecified complications: Secondary | ICD-10-CM

## 2016-08-25 ENCOUNTER — Ambulatory Visit: Payer: Medicare Other | Admitting: Neurology

## 2016-08-27 ENCOUNTER — Encounter: Payer: Self-pay | Admitting: Neurology

## 2016-09-08 ENCOUNTER — Ambulatory Visit (INDEPENDENT_AMBULATORY_CARE_PROVIDER_SITE_OTHER): Payer: Medicare Other | Admitting: Neurology

## 2016-09-08 ENCOUNTER — Encounter: Payer: Self-pay | Admitting: Neurology

## 2016-09-08 VITALS — BP 156/86 | HR 87 | Ht 62.0 in | Wt 229.0 lb

## 2016-09-08 DIAGNOSIS — I63411 Cerebral infarction due to embolism of right middle cerebral artery: Secondary | ICD-10-CM | POA: Diagnosis not present

## 2016-09-08 DIAGNOSIS — Z794 Long term (current) use of insulin: Secondary | ICD-10-CM

## 2016-09-08 DIAGNOSIS — E1159 Type 2 diabetes mellitus with other circulatory complications: Secondary | ICD-10-CM

## 2016-09-08 DIAGNOSIS — I10 Essential (primary) hypertension: Secondary | ICD-10-CM

## 2016-09-08 DIAGNOSIS — I480 Paroxysmal atrial fibrillation: Secondary | ICD-10-CM | POA: Diagnosis not present

## 2016-09-08 DIAGNOSIS — E785 Hyperlipidemia, unspecified: Secondary | ICD-10-CM

## 2016-09-08 DIAGNOSIS — E1169 Type 2 diabetes mellitus with other specified complication: Secondary | ICD-10-CM | POA: Diagnosis not present

## 2016-09-08 MED ORDER — ATORVASTATIN CALCIUM 80 MG PO TABS: 80.0000 mg | ORAL_TABLET | Freq: Every day | ORAL | 3 refills | Status: DC

## 2016-09-08 MED ORDER — AMLODIPINE BESYLATE 10 MG PO TABS: 10.0000 mg | ORAL_TABLET | Freq: Every day | ORAL | 3 refills | Status: DC

## 2016-09-08 NOTE — Progress Notes (Signed)
STROKE NEUROLOGY FOLLOW UP NOTE  NAME: [name] DOB: June 27, 1956  REASON FOR VISIT: stroke follow up HISTORY FROM: pt and chart  Today we had Carla pleasure of seeing [name] in follow-up at our Neurology Clinic. Pt was accompanied by no one.   History Summary [name] is a 61 y.o. female with history of atrial fibrillation ([name] stopped Xarelto secondary to cost), hypertension, hyperlipidemia, previous strokes, and diabetes mellitus as well as LBP admitted on 02/16/16 for palpitations and mild left pronator drift. MRI showed right external capsule acute infarct as well as remote lacunar infarcts at b/l thalami and right midbrain and left pons. MRA, CUS, TTE unremarkable, LDL 88 and A1C 9.3. She was restarted on Xarelto and put on lipitor 80mg . She was discharged in good condition.   04/23/16 follow up - [name] has been doing better.  Glucose still fluctuate 120-320. Now following up with endocrinologist. BP still a little high, today 152/72. Not checking BP at home. On Xarelto, compliant with medication. Not able to do more exercise due to LBP. Has appointment with cardiology in 06/2016.   Interval History During Carla interval time, pt has been doing well. No stroke like symptoms. However, yesterday pt had two episode of nose bleed, not severe, stopped on her own. That was Carla first time bleeding since on Xarelto. I recommend her to continue Xarelto and close monitoring with nose moisture techniques. Following with endocrinologist and glucose better, but still not normal. BP today 156/86 and she did not check BP at home. Follows with cardiology for afib management.   REVIEW OF SYSTEMS: Full 14 system review of systems performed and notable only for those listed below and in HPI above, all others are negative:  Constitutional:  palpitations Cardiovascular:  Ear/Nose/Throat:  Nose bleed Skin:  Eyes:  Blurry vision Respiratory:  SOB, choking Gastroitestinal:     Genitourinary: urgency Hematology/Lymphatic:   Endocrine:  Musculoskeletal:  Back pain, muscle cramps, walking difficulty Allergy/Immunology:   Neurological:  HA Psychiatric:  Sleep: insomnia  Carla following represents [name]'s updated allergies and side effects list: Allergies  Allergen Reactions  . Propoxyphene N-Acetaminophen Hives, Itching, Swelling and Other (See Comments)    REACTION: Hallucinations  . Glipizide Nausea And Vomiting  . Metformin And Related Diarrhea    Carla neurologically relevant items on [name]'s problem list were reviewed on today's visit.  Neurologic Examination  A problem focused neurological exam (12 or more points of Carla single system neurologic examination, vital signs counts as 1 point, cranial nerves count for 8 points) was performed.  Blood pressure (!) 156/86, pulse 87, height 5\' 2"  (1.575 m), weight 229 lb (103.9 kg).  General - obese, well developed, in no apparent distress.  Ophthalmologic - Fundi not visualized due to eye movement.  Cardiovascular - Regular rate and rhythm with no murmur, not in afib.  Mental Status -  Level of arousal and orientation to time, place, and person were intact. Language including expression, naming, repetition, comprehension was assessed and found intact. Fund of Knowledge was assessed and was intact.  Cranial Nerves II - XII - II - Visual field intact OU. III, IV, VI - Extraocular movements intact. V - Facial sensation intact bilaterally. VII - Facial movement intact bilaterally. VIII - Hearing & vestibular intact bilaterally. X - Palate elevates symmetrically. XI - Chin turning & shoulder shrug intact bilaterally. XII - Tongue protrusion intact.  Motor Strength - [name]'s strength was symmetrical in  all extremities with BLE 4+/5 proximally and pronator drift was absent.  Bulk was normal and fasciculations were absent.   Motor Tone - Muscle tone was assessed at Carla neck and appendages  and was normal.  Reflexes - [name]'s reflexes were 1+ in all extremities and she had no pathological reflexes.  Sensory - Light touch, temperature/pinprick were assessed and were normal.    Coordination - [name] had normal movements in Carla hands with no ataxia or dysmetria.  Tremor was absent.  Gait and Station -  Walk with cane, slow and small stride, stooped posturing due to LBP.   Data reviewed: I personally reviewed Carla images and agree with Carla radiology interpretations.  Mri and Mra Head Wo Contrast 02/16/2016   1. 8 mm acute nonhemorrhagic linear infarct within Carla right external capsule.  2. Moderate diffuse periventricular and subcortical T2 changes bilaterally reflect Carla sequela of chronic microvascular ischemia. White matter changes extend into Carla brainstem  3. Remote lacunar infarcts of Carla thalami bilaterally and right paramedian midbrain.  4. Mild sinus disease.  5. No significant stenosis within Carla anterior circulation.  6. Mild narrowing of Carla distal left vertebral artery without a significant stenosis relative to Carla more distal vessels.   CUS - Bilateral: No evidence of ICA stenosis. Vertebral artery flow is antegrade.  TTE - Left ventricle: Carla cavity size was normal. There was moderate  concentric hypertrophy. Systolic function was normal. Carla  estimated ejection fraction was in Carla range of 60% to 65%. Wall  motion was normal; there were no regional wall motion  abnormalities. Doppler parameters are consistent with abnormal  left ventricular relaxation (grade 1 diastolic dysfunction).  Doppler parameters are consistent with elevated ventricular  end-diastolic filling pressure. - Aortic valve: Trileaflet; normal thickness leaflets.  Transvalvular velocity was within Carla normal range. There was no  stenosis. There was no regurgitation. - Aortic root: Carla aortic root was normal in size. - Left atrium: Carla atrium was normal in size. -  Right ventricle: Carla cavity size was normal. Wall thickness was  normal. Systolic function was normal. - Right atrium: Carla atrium was normal in size. - Tricuspid valve: There was no regurgitation. - Pulmonic valve: There was no regurgitation. - Pulmonary arteries: Systolic pressure was within Carla normal  range. - Inferior vena cava: Carla vessel was normal in size. - Pericardium, extracardiac: There was no pericardial effusion.  Component     Latest Ref Rng & Units 02/17/2016 03/27/2016  Cholesterol     0 - 200 mg/dL 145   Triglycerides     <150 mg/dL 123   HDL Cholesterol     >40 mg/dL 32 (L)   Total CHOL/HDL Ratio     RATIO 4.5   VLDL     0 - 40 mg/dL 25   LDL (calc)     0 - 99 mg/dL 88   Hemoglobin A1C     4.8 - 5.6 % 9.3 (H)   Mean Plasma Glucose     mg/dL 220   TSH     0.350 - 4.500 uIU/mL 1.862   Direct LDL     <130 mg/dL  51    Assessment: As you may recall, she is a 61 y.o. African American female with PMH of atrial fibrillation ([name] stopped Xarelto secondary to cost), hypertension, hyperlipidemia, previous strokes, and diabetes mellitus as well as LBP admitted on 02/16/16 for right external capsule acute infarct as well as remote lacunar infarcts at b/l thalami  and right midbrain and left pons. MRA, CUS, TTE unremarkable, LDL 88 and A1C 9.3. She was restarted on Xarelto and put on lipitor 80mg . During Carla interval time, [name] has been doing better. Following up with endocrinologist for DM control. BP still a little high but not checking BP at home. On Xarelto, compliant with medication. One episode of nose bleeding, not severe.    Plan:  - continue Xarelto and lipitor for stroke prevention - close monitoring nose bleed, if continues, check with your PCP for nose drop or cream. If severe and nonstop, go to ED or call 911. Keep nose moisture.  - Follow up with your primary care physician for stroke risk factor modification. Recommend maintain blood pressure goal  <130/80, diabetes with hemoglobin A1c goal below 7.0% and lipids with LDL cholesterol goal below 70 mg/dL.  - check BP and glucose at home and record and bring over to PCP for medication adjustment if needed. - diabetic diet and regular exercise  - follow up with endocrinologist for better DM control - follow up with cardiology for afib treatment - follow up in 6 months with Hoyle Sauer or [place], if stable, can be discharged after.   I spent more than 25 minutes of face to face time with [name]. Greater than 50% of time was spent in counseling and coordination of care. We discussed continue Xarelto, BP and glucose monitoring at home, and management of nose bleed.   No orders of Carla defined types were placed in this encounter.   Meds ordered this encounter  Medications  . amLODipine (NORVASC) 10 MG tablet    Sig: Take 1 tablet (10 mg total) by mouth daily.    Dispense:  90 tablet    Refill:  3  . atorvastatin (LIPITOR) 80 MG tablet    Sig: Take 1 tablet (80 mg total) by mouth daily at 6 PM.    Dispense:  90 tablet    Refill:  3    [name] Instructions  - continue Xarelto and lipitor for stroke prevention - close monitoring nose bleed, if continues, check with your PCP for nose drop or cream. If severe and nonstop, go to ED or call 911. Keep nose moisture.  - Follow up with your primary care physician for stroke risk factor modification. Recommend maintain blood pressure goal <130/80, diabetes with hemoglobin A1c goal below 7.0% and lipids with LDL cholesterol goal below 70 mg/dL.  - check BP and glucose at home and record and bring over to PCP for medication adjustment if needed. - diabetic diet and regular exercise  - follow up with endocrinologist for better DM control - follow up with cardiology for afib treatment - follow up in 6 months.    Rosalin Hawking, MD PhD [facility] [address] [place], [place] [address] [phone]

## 2016-09-08 NOTE — Patient Instructions (Addendum)
-   continue Xarelto and lipitor for stroke prevention - close monitoring nose bleed, if continues, check with your PCP for nose drop or cream. If severe and nonstop, go to ED or call 911. Keep nose moisture.  - Follow up with your primary care physician for stroke risk factor modification. Recommend maintain blood pressure goal <130/80, diabetes with hemoglobin A1c goal below 7.0% and lipids with LDL cholesterol goal below 70 mg/dL.  - check BP and glucose at home and record and bring over to PCP for medication adjustment if needed. - diabetic diet and regular exercise  - follow up with endocrinologist for better DM control - follow up with cardiology for afib treatment - follow up in 6 months.

## 2016-10-05 ENCOUNTER — Encounter (HOSPITAL_COMMUNITY): Payer: Self-pay

## 2016-10-05 ENCOUNTER — Emergency Department (HOSPITAL_COMMUNITY): Payer: Medicare Other

## 2016-10-05 ENCOUNTER — Emergency Department (HOSPITAL_COMMUNITY)
Admission: EM | Admit: 2016-10-05 | Discharge: 2016-10-05 | Disposition: A | Discharge status: Home / Self Care | Payer: Medicare Other | Attending: Emergency Medicine | Admitting: Emergency Medicine

## 2016-10-05 DIAGNOSIS — I129 Hypertensive chronic kidney disease with stage 1 through stage 4 chronic kidney disease, or unspecified chronic kidney disease: Secondary | ICD-10-CM | POA: Diagnosis not present

## 2016-10-05 DIAGNOSIS — R05 Cough: Secondary | ICD-10-CM | POA: Diagnosis present

## 2016-10-05 DIAGNOSIS — N182 Chronic kidney disease, stage 2 (mild): Secondary | ICD-10-CM | POA: Insufficient documentation

## 2016-10-05 DIAGNOSIS — Z794 Long term (current) use of insulin: Secondary | ICD-10-CM | POA: Diagnosis not present

## 2016-10-05 DIAGNOSIS — J069 Acute upper respiratory infection, unspecified: Secondary | ICD-10-CM | POA: Insufficient documentation

## 2016-10-05 DIAGNOSIS — Z8673 Personal history of transient ischemic attack (TIA), and cerebral infarction without residual deficits: Secondary | ICD-10-CM | POA: Insufficient documentation

## 2016-10-05 DIAGNOSIS — R059 Cough, unspecified: Secondary | ICD-10-CM

## 2016-10-05 LAB — CBG MONITORING, ED: GLUCOSE-CAPILLARY: 276 mg/dL — AB (ref 65–99)

## 2016-10-05 MED ORDER — GUAIFENESIN-DM 100-10 MG/5ML PO SYRP: 5.0000 mL | ORAL_SOLUTION | ORAL | 0 refills | Status: DC | PRN

## 2016-10-05 MED ORDER — BENZONATATE 100 MG PO CAPS: 100.0000 mg | ORAL_CAPSULE | Freq: Three times a day (TID) | ORAL | 0 refills | Status: DC | PRN

## 2016-10-05 NOTE — ED Triage Notes (Signed)
Patient complains of cough, congestion, and chills that she describes as deep body aches x 3 days. Alert and oriented, NAD

## 2016-10-05 NOTE — Discharge Instructions (Signed)
Cough syrup as needed. Flonase (over-the-counter) as needed for nasal congestion.  Please call your primary care provider in the morning to schedule a follow-up appointment. Return to ER for fevers, difficulty breathing, new or worsening symptoms, any additional concerns.

## 2016-10-05 NOTE — ED Provider Notes (Signed)
[facility] DEPT Provider Note   CSN: [id number] Arrival date & time: 10/05/16  1315  By signing my name below, I, [name], attest that this documentation has been prepared under the direction and in the presence of [facility], PA-C.  Electronically Signed: [name], ED Scribe. 10/05/16. 3:06 PM.  History   Chief Complaint Chief Complaint  Patient presents with  . Cough  . Nasal Congestion   The history is provided by the patient. No language interpreter was used.    HPI Comments: [name] is a 61 y.o. female w/ a h/o afib, DM2, and HTN who presents to the Emergency Department complaining of gradual onset, constant generalized myalgias beginning three days ago with associated chills, rhinorrhea, and productive cough w/ yellow sputum, and mild shortness of breath. No treatments for her symptoms were tried prior to coming into the ED. There are no alleviating or exacerbating factors to her symptoms. Her niece has recently been sick with influenza-like symptoms. Pt is unsure if she received her seasonal influenza vaccination this year. She denies fever, sore throat, chest pain, difficulty breathing or any other associated symptoms.   Past Medical History:  Diagnosis Date  . Atrial fibrillation ([place])   . 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 Diabetes mellitus    type 2  . Hypercholesteremia   . Hypertension   . Hypertension    Hypertensive urgency 05/2006   Patient Active Problem List   Diagnosis Date Noted  . BV (bacterial vaginosis)   . Stroke (cerebrum) ([place]) 02/16/2016  . Stroke ([place]) 02/16/2016  . Acute ischemic stroke ([place])   . Type 2 diabetes mellitus with circulatory disorder, with long-term current use of insulin ([place])   . Paroxysmal atrial fibrillation ([facility])   . Upper back pain 10/12/2015  . Abdominal pain 06/11/2015  . Leg cramps, sleep related 06/11/2015  . Pain of right scapula 03/14/2015  .  Dental abscess 12/07/2014  . Chronic dental pain 10/13/2014  . Intractable migraine without aura and without status migrainosus 02/21/2014  . Dyspnea 01/24/2014  . Mild obstructive sleep apnea 11/18/2013  . Insomnia 11/18/2013  . Trapezius muscle strain 09/07/2013  . Other social stressor 07/15/2013  . Sinus congestion 07/12/2013  . Toenail fungus 12/30/2012  . Obese 10/22/2012  . Abnormality of gait and mobility 08/18/2011  . Edema 08/12/2011  . Constipation 05/28/2011  . History of CVA (cerebrovascular accident) 05/20/2011  . Chest pain 05/20/2011  . Hot flashes 04/28/2011  . Headache 11/18/2010  . Long term (current) use of anticoagulants 09/14/2010  . CARPAL TUNNEL SYNDROME, LEFT 02/19/2010  . Diabetes mellitus out of control ([place]) 06/12/2009  . Atrial fibrillation ([place]) 06/07/2009  . Dermatophytosis of foot 03/13/2009  . Depression 09/29/2008  . Hyperlipidemia associated with type 2 diabetes mellitus ([place]) 08/22/2008  . CHRONIC KIDNEY DISEASE STAGE II (M[facility]) 08/01/2008  . STRESS INCONTINENCE 07/31/2008  . Essential hypertension 04/16/2007   Past Surgical History:  Procedure Laterality Date  . DILATION AND CURETTAGE OF UTERUS  1976  . MENISCUS REPAIR  03/09   right knee  . TONSILECTOMY, ADENOIDECTOMY, BILATERAL MYRINGOTOMY AND TUBES  age 81  . TONSILLECTOMY     OB History    No data available     Home Medications    Prior to Admission medications   Medication Sig Start Date End Date Taking? Authorizing Provider  albuterol (PROVENTIL HFA;VENTOLIN HFA) 108 (90 Base) MCG/ACT inhaler Inhale 2 puffs  into the lungs every 6 (six) hours as needed for wheezing or shortness of breath. 12/10/15   Fredia Sorrow, MD  amLODipine (NORVASC) 10 MG tablet Take 1 tablet (10 mg total) by mouth daily. 09/08/16   Rosalin Hawking, MD  atorvastatin (LIPITOR) 80 MG tablet Take 1 tablet (80 mg total) by mouth daily at 6 PM. 09/08/16   Rosalin Hawking, MD  calcipotriene (DOVONOX) 0.005 % cream  03/04/16    Historical Provider, MD  cloNIDine (CATAPRES) 0.2 MG tablet Take 1 tablet (0.2 mg total) by mouth 2 (two) times daily. 03/18/16   Verner Mould, MD  glucose blood test strip Use as instructed 03/18/16   Verner Mould, MD  guaiFENesin-dextromethorphan [facility] DM) 100-10 MG/5ML syrup Take 5 mLs by mouth every 4 (four) hours as needed for cough. 10/05/16   Ozella Almond Graig Hessling, PA-C  insulin aspart (NOVOLOG) 100 UNIT/ML injection Inject 20 units under the skin with breakfast and 30 units under the skin with lunch Patient taking differently: Inject 20 units under the skin with breakfast and 30 units under the skin with lunch 03/27/16   Zenia Resides, MD  LANTUS SOLOSTAR 100 UNIT/ML Solostar Pen ADMINISTER 65 UNITS UNDER THE SKIN DAILY 07/22/16   Verner Mould, MD  Liraglutide (VICTOZA) 18 MG/3ML SOPN Inject 0.3 mLs (1.8 mg total) into the skin daily. Patient taking differently: Inject 1.8 mg into the skin at bedtime.  06/11/15   Verner Mould, MD  lisinopril (PRINIVIL,ZESTRIL) 40 MG tablet TAKE 1 TABLET(40 MG) BY MOUTH DAILY 06/13/16   Verner Mould, MD  omeprazole (PRILOSEC) 20 MG capsule Take 1 capsule (20 mg total) by mouth 2 (two) times daily before a meal. 06/11/15   Verner Mould, MD  rivaroxaban (XARELTO) 20 MG TABS tablet Take 1 tablet (20 mg total) by mouth daily with supper. 03/18/16   Verner Mould, MD  sertraline (ZOLOFT) 50 MG tablet Take 1 tablet (50 mg total) by mouth daily. 03/18/16   Verner Mould, MD  spironolactone (ALDACTONE) 50 MG tablet Take 1 tablet (50 mg total) by mouth daily. 03/18/16   Verner Mould, MD   Family History Family History  Problem Relation Age of Onset  . Heart failure Mother   . Diabetes Father   . Hypertension Father   . Lung cancer Sister   . Stroke Brother    Social History Social History  Substance Use Topics  . Smoking status: Never Smoker  . Smokeless  tobacco: Never Used  . Alcohol use No   Allergies   Propoxyphene n-acetaminophen; Glipizide; and Metformin and related  Review of Systems Review of Systems  Constitutional: Positive for chills. Negative for fever.  HENT: Positive for rhinorrhea.   Respiratory: Positive for cough.   Musculoskeletal: Positive for myalgias (generalized).   Physical Exam Updated Vital Signs BP 142/88 (BP Location: Left Arm)   Pulse 73   Temp 98.2 F (36.8 C) (Oral)   Resp 16   SpO2 100%   Physical Exam  Constitutional: She is oriented to person, place, and time. She appears well-developed and well-nourished. No distress.  HENT:  Head: Normocephalic and atraumatic.  Nose: Mucosal edema and rhinorrhea present. Right sinus exhibits no maxillary sinus tenderness and no frontal sinus tenderness. Left sinus exhibits no maxillary sinus tenderness and no frontal sinus tenderness.  OP with erythema, no exudates or tonsillar hypertrophy. + nasal congestion with mucosal edema.   Neck: Normal range of motion. Neck supple.  No meningeal  signs.   Cardiovascular:  Regular rate and rhythm.   Pulmonary/Chest: Effort normal.  Lungs are clear to auscultation bilaterally - no w/r/r  Abdominal: Soft. She exhibits no distension. There is no tenderness.  Musculoskeletal: Normal range of motion.  No tenderness to palpation of the upper or lower extremities.   Neurological: She is alert and oriented to person, place, and time.  Skin: Skin is warm and dry. She is not diaphoretic.  Nursing note and vitals reviewed.  ED Treatments / Results  DIAGNOSTIC STUDIES: Oxygen Saturation is 100% on RA, normal by my interpretation.   COORDINATION OF CARE: 3:06 PM-Discussed next steps with pt. Pt verbalized understanding and is agreeable with the plan.   Labs (all labs ordered are listed, but only abnormal results are displayed) Labs Reviewed  CBG MONITORING, ED - Abnormal; Notable for the following:       Result Value    Glucose-Capillary 276 (*)    All other components within normal limits   EKG  EKG Interpretation None      Radiology Dg Chest 2 View  Result Date: 10/05/2016 CLINICAL DATA:  Cough, congestion and chills. EXAM: CHEST  2 VIEW COMPARISON:  02/16/2016 FINDINGS: The heart size and mediastinal contours are within normal limits. There is no evidence of pulmonary edema, consolidation, pneumothorax, nodule or pleural fluid. The visualized skeletal structures are unremarkable. IMPRESSION: No active cardiopulmonary disease. Electronically Signed   By: Aletta Edouard [name]   On: 10/05/2016 15:06    Procedures Procedures   Medications Ordered in ED Medications - No data to display  Initial Impression / Assessment and Plan / ED Course  I have reviewed the triage vital signs and the nursing notes.  Pertinent labs & imaging results that were available during my care of the patient were reviewed by me and considered in my medical decision making (see chart for details).     [name] is afebrile, non-toxic appearing with a clear lung exam. Mild rhinorrhea and OP with erythema, no exudates. CXR negative. Likely viral URI. Patient is agreeable to symptomatic treatment with close follow up with PCP as needed but spoke at length about emergent, changing, or worsening of symptoms that should prompt return to ER. Given co morbidities, I strongly suggested she follow up with PCP for recheck in the next few days and significant amount of time was taken to discuss return precautions. Patient voices understanding and is agreeable to plan.   Blood pressure 142/88, pulse 73, temperature 98.2 F (36.8 C), temperature source Oral, resp. rate 16, SpO2 100 %.   Final Clinical Impressions(s) / ED Diagnoses   Final diagnoses:  Cough  Upper respiratory tract infection, unspecified type   New Prescriptions Discharge Medication List as of 10/05/2016  4:14 PM    START taking these medications   Details    guaiFENesin-dextromethorphan (ROBITUSSIN DM) 100-10 MG/5ML syrup Take 5 mLs by mouth every 4 (four) hours as needed for cough., Starting Sun 10/05/2016, Print       I personally performed the services described in this documentation, which was scribed in my presence. The recorded information has been reviewed and is accurate.     [facility] Leopoldo Mazzie, PA-C 10/05/16 [place], MD 10/05/16 [phone]

## 2016-10-06 ENCOUNTER — Other Ambulatory Visit: Payer: Self-pay | Admitting: Internal Medicine

## 2016-10-06 DIAGNOSIS — Z1231 Encounter for screening mammogram for malignant neoplasm of breast: Secondary | ICD-10-CM

## 2016-10-28 ENCOUNTER — Ambulatory Visit
Admission: RE | Admit: 2016-10-28 | Discharge: 2016-10-28 | Disposition: A | Discharge status: Home / Self Care | Payer: Medicare Other | Source: Ambulatory Visit | Attending: Family Medicine | Admitting: Family Medicine

## 2016-10-28 DIAGNOSIS — Z1231 Encounter for screening mammogram for malignant neoplasm of breast: Secondary | ICD-10-CM | POA: Diagnosis not present

## 2016-11-04 ENCOUNTER — Ambulatory Visit (INDEPENDENT_AMBULATORY_CARE_PROVIDER_SITE_OTHER): Payer: Medicare Other | Admitting: Internal Medicine

## 2016-11-04 ENCOUNTER — Encounter: Payer: Self-pay | Admitting: Internal Medicine

## 2016-11-04 VITALS — BP 146/82 | HR 89 | Temp 97.8°F | Ht 62.0 in | Wt 238.4 lb

## 2016-11-04 DIAGNOSIS — E1159 Type 2 diabetes mellitus with other circulatory complications: Secondary | ICD-10-CM

## 2016-11-04 DIAGNOSIS — E118 Type 2 diabetes mellitus with unspecified complications: Secondary | ICD-10-CM | POA: Diagnosis not present

## 2016-11-04 DIAGNOSIS — Z794 Long term (current) use of insulin: Secondary | ICD-10-CM

## 2016-11-04 DIAGNOSIS — E785 Hyperlipidemia, unspecified: Secondary | ICD-10-CM

## 2016-11-04 DIAGNOSIS — E1169 Type 2 diabetes mellitus with other specified complication: Secondary | ICD-10-CM

## 2016-11-04 DIAGNOSIS — I63411 Cerebral infarction due to embolism of right middle cerebral artery: Secondary | ICD-10-CM | POA: Diagnosis not present

## 2016-11-04 DIAGNOSIS — F329 Major depressive disorder, single episode, unspecified: Secondary | ICD-10-CM

## 2016-11-04 DIAGNOSIS — I10 Essential (primary) hypertension: Secondary | ICD-10-CM | POA: Diagnosis not present

## 2016-11-04 DIAGNOSIS — R0981 Nasal congestion: Secondary | ICD-10-CM

## 2016-11-04 DIAGNOSIS — E1165 Type 2 diabetes mellitus with hyperglycemia: Secondary | ICD-10-CM | POA: Diagnosis not present

## 2016-11-04 DIAGNOSIS — F32A Depression, unspecified: Secondary | ICD-10-CM

## 2016-11-04 DIAGNOSIS — IMO0002 Reserved for concepts with insufficient information to code with codable children: Secondary | ICD-10-CM

## 2016-11-04 LAB — POCT GLYCOSYLATED HEMOGLOBIN (HGB A1C): Hemoglobin A1C: 14.1

## 2016-11-04 MED ORDER — FLUTICASONE PROPIONATE 50 MCG/ACT NA SUSP: 2.0000 | Freq: Every day | NASAL | 6 refills | Status: DC

## 2016-11-04 MED ORDER — INSULIN ASPART 100 UNIT/ML ~~LOC~~ SOLN: SUBCUTANEOUS | 2 refills | Status: DC

## 2016-11-04 MED ORDER — INSULIN GLARGINE 100 UNIT/ML SOLOSTAR PEN: PEN_INJECTOR | SUBCUTANEOUS | 2 refills | Status: DC

## 2016-11-04 MED ORDER — RIVAROXABAN 20 MG PO TABS: 20.0000 mg | ORAL_TABLET | Freq: Every day | ORAL | 3 refills | Status: DC

## 2016-11-04 MED ORDER — GLUCOSE BLOOD VI STRP: ORAL_STRIP | 12 refills | Status: DC

## 2016-11-04 MED ORDER — LISINOPRIL 40 MG PO TABS: ORAL_TABLET | ORAL | 3 refills | Status: DC

## 2016-11-04 MED ORDER — CLONIDINE HCL 0.2 MG PO TABS: 0.2000 mg | ORAL_TABLET | Freq: Two times a day (BID) | ORAL | 3 refills | Status: DC

## 2016-11-04 MED ORDER — SPIRONOLACTONE 50 MG PO TABS: 50.0000 mg | ORAL_TABLET | Freq: Every day | ORAL | 3 refills | Status: DC

## 2016-11-04 MED ORDER — LIRAGLUTIDE 18 MG/3ML ~~LOC~~ SOPN: 1.8000 mg | PEN_INJECTOR | Freq: Every day | SUBCUTANEOUS | 2 refills | Status: DC

## 2016-11-04 NOTE — Assessment & Plan Note (Signed)
Continue lipitor  ?

## 2016-11-04 NOTE — Assessment & Plan Note (Signed)
BP at goal today.  - Continue lisinopril 40mg , amlodipine 10mg , clonidine 0.2mg  BID, and spironolactone 50mg   - BMP today to monitor Cr

## 2016-11-04 NOTE — Assessment & Plan Note (Signed)
Remains uncontrolled with A1C 14 today. Likely due to patient not taking Novolog as prescribed. Discussed importance of taking medication daily as prescribed and long-term sequelae if patient does not control DM better. Patient voiced understanding.  - Prescribed new glucometer today. Patient to contact office if any issues obtaining glucometer or needing any associated supplies.  - Continue Lantus 65U at breakfast - Stressed importance of taking Novolog as prescribed: 20U with breakfast, 30U with lunch - Continue Victoza 1.8mg  qhs - Check blood sugar with meals - F/u in one month - Provided patient with contact info of three ophthalmologists who appear to accept Medicare from online search. Stressed importance of schedule appt ASAP given recent vision changes.

## 2016-11-04 NOTE — Progress Notes (Signed)
Subjective:   Patient: [name]       Birthdate: 1955-12-01       MRN: [id number]      HPI  [name] is a 61 y.o. female presenting for follow-up of chronic conditions.   Uncontrolled Type II DM Lost meter in November so has not been checking blood sugar for past 6 months. Also is not taking Novolog as prescribed. Patient prescribed Novolog 20U with breakfast and 30U with lunch, however only takes it when she feels as if her blood sugar is high (when she feels "like worms are crawling on me"). Usually takes 3-4 times per week due to this feeling, but during the holidays when she was eating a lot of sweets she would sometimes take multiple times a day. Has noticed worsening vision, and now says she sees double when looking straight ahead. She is able to see better when looking at things from the side. Performs foot check about once a week. Denies any cuts, wounds, ulcers. Denies numbness or tingling in her hands or feet. Has been taking Lantus 65U in AM and Victoza 1.8mg  qhs as prescribed.   HTN Does not check blood pressure at home or at pharmacy. Denies headaches. Endorses vision changes (see above). Is taking lisinopril, amlodipine, clonidine, and spironolactone as prescribed.   HLD Is taking lipitor as prescribed.   Depression Has not taken Zoloft in many months because she did not follow up after last appointment as planned and did not have refills. Says her mood has been good recently and she does not feel that she needs to go back on medication. Reports some "ups and downs" but says these correlate to recent life stressors. Denies SI or HI.   Nasal congestion Ongoing for about a month. Patient says she was seen in ED around the time symptoms started and was told she was getting over the flu. She has continued to have nasal congestion since then that is worse at night. She has to breathe through her mouth at night due to mucous running down the back of her throat. She has  tried Mucinex which did not help. She denies fevers, chills, coughing, sore throat, difficulty breathing. Denies history of seasonal allergies.   Smoking status reviewed. Patient is never smoker.   Review of Systems See HPI.     Objective:  Physical Exam  Constitutional: She is oriented to person, place, and time.  Obese female in NAD  HENT:  Head: Normocephalic and atraumatic.  Nose: Nose normal.  Mouth/Throat: Oropharynx is clear and moist. No oropharyngeal exudate.  Eyes: Conjunctivae and EOM are normal. Pupils are equal, round, and reactive to light. Right eye exhibits no discharge. Left eye exhibits no discharge. No scleral icterus.  Arcus senilis present bilaterally.   Cardiovascular: Normal rate, regular rhythm and normal heart sounds.   No murmur heard. Pulmonary/Chest: Effort normal and breath sounds normal. No respiratory distress. She has no wheezes.  Musculoskeletal: She exhibits no edema.  Neurological: She is alert and oriented to person, place, and time.  Skin: Skin is warm and dry.  Psychiatric: Affect and judgment normal.   Diabetic Foot Exam - Simple   Simple Foot Form Diabetic Foot exam was performed with the following findings:  Yes 11/04/2016 10:02 AM  Visual Inspection No deformities, no ulcerations, no other skin breakdown bilaterally:  Yes Sensation Testing Intact to touch and monofilament testing bilaterally:  Yes Pulse Check Posterior Tibialis and Dorsalis pulse intact bilaterally:  Yes Comments  Assessment & Plan:  Diabetes mellitus out of control ([place]) Remains uncontrolled with A1C 14 today. Likely due to patient not taking Novolog as prescribed. Discussed importance of taking medication daily as prescribed and long-term sequelae if patient does not control DM better. Patient voiced understanding.  - Prescribed new glucometer today. Patient to contact office if any issues obtaining glucometer or needing any associated supplies.  - Continue  Lantus 65U at breakfast - Stressed importance of taking Novolog as prescribed: 20U with breakfast, 30U with lunch - Continue Victoza 1.8mg  qhs - Check blood sugar with meals - F/u in one month - Provided patient with contact info of three ophthalmologists who appear to accept Medicare from online search. Stressed importance of schedule appt ASAP given recent vision changes.   Sinus congestion Possibly allergic etiology as ongoing for about a month and seasons currently changing. No fevers or associated symptoms to suggest infectious etiology. Well-appearing with lungs CTAB.  - Flonase  Essential hypertension BP at goal today.  - Continue lisinopril 40mg , amlodipine 10mg , clonidine 0.2mg  BID, and spironolactone 50mg   - BMP today to monitor Cr  Depression Has not taken Zoloft in almost 6 months. Reports good mood and does not want to resume medication at this time. No SI/HI.  - Will not resume medication today given patient's current stable mood and request for no medication - Continue to monitor closely with low threshold to resume Zoloft if patient's mood declines  Hyperlipidemia associated with type 2 diabetes mellitus Continue lipitor  Adin Hector, MD, MPH PGY-2 [facility] Pager [phone]

## 2016-11-04 NOTE — Assessment & Plan Note (Signed)
Has not taken Zoloft in almost 6 months. Reports good mood and does not want to resume medication at this time. No SI/HI.  - Will not resume medication today given patient's current stable mood and request for no medication - Continue to monitor closely with low threshold to resume Zoloft if patient's mood declines

## 2016-11-04 NOTE — Assessment & Plan Note (Signed)
Possibly allergic etiology as ongoing for about a month and seasons currently changing. No fevers or associated symptoms to suggest infectious etiology. Well-appearing with lungs CTAB.  - Flonase

## 2016-11-04 NOTE — Patient Instructions (Addendum)
It was nice seeing you again today Ms. [place]a!  It is VERY important that you start taking your Novolog EVERY DAY AT [facility] AND LUNCH. Please take 20 units with breakfast and 30 units with lunch. Continue to take Lantus 65 units with breakfast and Victoza 1.8 mg at bedtime.   Please continue to take all your other medications as you have been.   For your nasal congestion, you can begin using the Flonase nasal spray. Spray two squirts into each nostril.  If you have any trouble getting your glucose meter or need any additional supplies, please let us know.   I have listed the contact information for three eye doctors that should accept Medicare. You should ask them on the phone when you schedule your appointment to make sure. Please schedule an appointment with an eye doctor as soon as possible.  Dr. Dellis Filbert Bond [phone]  Dr. Rutherford Guys [phone]  Dr. Tempie Hoist [phone]  I would like to see you back in a month to see how you are doing.    If you have any questions or concerns, please feel free to call the clinic.   Be well,  Dr. Avon Gully

## 2016-11-05 LAB — BASIC METABOLIC PANEL
BUN/Creatinine Ratio: 15 (ref 12–28)
BUN: 14 mg/dL (ref 8–27)
CALCIUM: 9 mg/dL (ref 8.7–10.3)
CO2: 24 mmol/L (ref 18–29)
Chloride: 100 mmol/L (ref 96–106)
Creatinine, Ser: 0.91 mg/dL (ref 0.57–1.00)
GFR, EST AFRICAN AMERICAN: 79 mL/min/{1.73_m2} (ref >59)
GFR, EST NON AFRICAN AMERICAN: 68 mL/min/{1.73_m2} (ref >59)
Glucose: 299 mg/dL — ABNORMAL HIGH (ref 65–99)
Potassium: 4 mmol/L (ref 3.5–5.2)
Sodium: 139 mmol/L (ref 134–144)

## 2016-11-07 ENCOUNTER — Emergency Department (HOSPITAL_COMMUNITY)
Admission: EM | Admit: 2016-11-07 | Discharge: 2016-11-07 | Disposition: A | Discharge status: Home / Self Care | Payer: Medicare Other | Attending: Emergency Medicine | Admitting: Emergency Medicine

## 2016-11-07 ENCOUNTER — Emergency Department (HOSPITAL_COMMUNITY): Payer: Medicare Other

## 2016-11-07 DIAGNOSIS — M5431 Sciatica, right side: Secondary | ICD-10-CM | POA: Diagnosis not present

## 2016-11-07 DIAGNOSIS — M549 Dorsalgia, unspecified: Secondary | ICD-10-CM | POA: Diagnosis present

## 2016-11-07 DIAGNOSIS — E1122 Type 2 diabetes mellitus with diabetic chronic kidney disease: Secondary | ICD-10-CM | POA: Diagnosis not present

## 2016-11-07 DIAGNOSIS — N182 Chronic kidney disease, stage 2 (mild): Secondary | ICD-10-CM | POA: Diagnosis not present

## 2016-11-07 DIAGNOSIS — I129 Hypertensive chronic kidney disease with stage 1 through stage 4 chronic kidney disease, or unspecified chronic kidney disease: Secondary | ICD-10-CM | POA: Diagnosis not present

## 2016-11-07 DIAGNOSIS — Z794 Long term (current) use of insulin: Secondary | ICD-10-CM | POA: Insufficient documentation

## 2016-11-07 DIAGNOSIS — M5441 Lumbago with sciatica, right side: Secondary | ICD-10-CM | POA: Insufficient documentation

## 2016-11-07 DIAGNOSIS — Z8673 Personal history of transient ischemic attack (TIA), and cerebral infarction without residual deficits: Secondary | ICD-10-CM | POA: Diagnosis not present

## 2016-11-07 DIAGNOSIS — Z7901 Long term (current) use of anticoagulants: Secondary | ICD-10-CM | POA: Diagnosis not present

## 2016-11-07 DIAGNOSIS — M62830 Muscle spasm of back: Secondary | ICD-10-CM

## 2016-11-07 DIAGNOSIS — M545 Low back pain: Secondary | ICD-10-CM | POA: Diagnosis not present

## 2016-11-07 LAB — CBC WITH DIFFERENTIAL/PLATELET
BASOS ABS: 0 10*3/uL (ref 0.0–0.1)
Basophils Relative: 0 %
Eosinophils Absolute: 0.3 10*3/uL (ref 0.0–0.7)
Eosinophils Relative: 3 %
HCT: 35.8 % — ABNORMAL LOW (ref 36.0–46.0)
HEMOGLOBIN: 11.3 g/dL — AB (ref 12.0–15.0)
LYMPHS ABS: 3.3 10*3/uL (ref 0.7–4.0)
Lymphocytes Relative: 37 %
MCH: 22.4 pg — AB (ref 26.0–34.0)
MCHC: 31.6 g/dL (ref 30.0–36.0)
MCV: 71 fL — AB (ref 78.0–100.0)
Monocytes Absolute: 0.5 10*3/uL (ref 0.1–1.0)
Monocytes Relative: 5 %
NEUTROS PCT: 55 %
Neutro Abs: 5 10*3/uL (ref 1.7–7.7)
Platelets: 303 10*3/uL (ref 150–400)
RBC: 5.04 MIL/uL (ref 3.87–5.11)
RDW: 16 % — ABNORMAL HIGH (ref 11.5–15.5)
WBC: 9.1 10*3/uL (ref 4.0–10.5)

## 2016-11-07 LAB — COMPREHENSIVE METABOLIC PANEL
ALK PHOS: 89 U/L (ref 38–126)
ALT: 13 U/L — ABNORMAL LOW (ref 14–54)
AST: 18 U/L (ref 15–41)
Albumin: 3.6 g/dL (ref 3.5–5.0)
Anion gap: 13 (ref 5–15)
BUN: 14 mg/dL (ref 6–20)
CO2: 22 mmol/L (ref 22–32)
CREATININE: 0.96 mg/dL (ref 0.44–1.00)
Calcium: 9.2 mg/dL (ref 8.9–10.3)
Chloride: 105 mmol/L (ref 101–111)
Glucose, Bld: 284 mg/dL — ABNORMAL HIGH (ref 65–99)
Potassium: 3.7 mmol/L (ref 3.5–5.1)
SODIUM: 140 mmol/L (ref 135–145)
Total Bilirubin: 0.6 mg/dL (ref 0.3–1.2)
Total Protein: 7.2 g/dL (ref 6.5–8.1)

## 2016-11-07 MED ORDER — IBUPROFEN 600 MG PO TABS: 600.0000 mg | ORAL_TABLET | Freq: Four times a day (QID) | ORAL | 0 refills | Status: DC | PRN

## 2016-11-07 MED ORDER — DIAZEPAM 5 MG/ML IJ SOLN
5.0000 mg | Freq: Once | INTRAMUSCULAR | Status: AC
  Administered 2016-11-07: 5 mg via INTRAVENOUS
  Filled 2016-11-07: qty 2

## 2016-11-07 MED ORDER — MORPHINE SULFATE (PF) 4 MG/ML IV SOLN
4.0000 mg | Freq: Once | INTRAVENOUS | Status: AC
  Administered 2016-11-07: 4 mg via INTRAVENOUS
  Filled 2016-11-07: qty 1

## 2016-11-07 MED ORDER — HYDROCODONE-ACETAMINOPHEN 5-325 MG PO TABS
2.0000 | ORAL_TABLET | Freq: Once | ORAL | Status: AC
  Administered 2016-11-07: 2 via ORAL
  Filled 2016-11-07: qty 2

## 2016-11-07 MED ORDER — CYCLOBENZAPRINE HCL 5 MG PO TABS: 5.0000 mg | ORAL_TABLET | Freq: Three times a day (TID) | ORAL | 0 refills | Status: DC | PRN

## 2016-11-07 MED ORDER — HYDROCODONE-ACETAMINOPHEN 5-325 MG PO TABS: 1.0000 | ORAL_TABLET | Freq: Four times a day (QID) | ORAL | 0 refills | Status: DC | PRN

## 2016-11-07 NOTE — ED Notes (Signed)
Pt to xray

## 2016-11-07 NOTE — Discharge Instructions (Signed)
Take motrin for pain.   Take flexeril for muscle spasms.   Take vicodin for severe pain. DO NOT drive with it.   Avoid heavy lifting or bending down.   See your doctor   Return to ER if you have severe back pain, leg numbness or weakness, abdominal pain, flank pain, vomiting.

## 2016-11-07 NOTE — ED Triage Notes (Signed)
Patient comes in per POV with complaints of back pain. Back pain is chronic however worsened yesterday. States she picked up her laundry and it worsened. Usually takes aleve for relief and not helping. Patient states it's in the sacral area. Hx htn, dm, afib, cvas. Denies numbness or tingling. Denies urinary symptoms.

## 2016-11-07 NOTE — ED Provider Notes (Signed)
[facility] DEPT Provider Note   CSN: [id number] Arrival date & time: 11/07/16  1057     History   Chief Complaint Chief Complaint  Patient presents with  . Back Pain    HPI [name] is a 61 y.o. female hx of afib on xarelto, DM, HL, HTN, here with back pain. Patient states that she was doing laundry yesterday and picked up a load of heavy laundry and suddenly had worsening right-sided back pain. She states that the pain radiates down her right buttock but denies pain down her leg. Patient denies any numbness or tingling down the legs or weakness. She says she had previous back pain is not currently on any pain medicine. Denies any abdominal pain or vomiting.   The history is provided by the patient.    Past Medical History:  Diagnosis Date  . Atrial fibrillation ([place])   . 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 Diabetes mellitus    type 2  . Hypercholesteremia   . Hypertension   . Hypertension    Hypertensive urgency 05/2006    Patient Active Problem List   Diagnosis Date Noted  . BV (bacterial vaginosis)   . Stroke (cerebrum) ([place]) 02/16/2016  . Stroke ([place]) 02/16/2016  . Acute ischemic stroke ([place])   . Type 2 diabetes mellitus with circulatory disorder, with long-term current use of insulin ([place])   . Paroxysmal atrial fibrillation ([facility])   . Upper back pain 10/12/2015  . Abdominal pain 06/11/2015  . Leg cramps, sleep related 06/11/2015  . Pain of right scapula 03/14/2015  . Dental abscess 12/07/2014  . Chronic dental pain 10/13/2014  . Intractable migraine without aura and without status migrainosus 02/21/2014  . Dyspnea 01/24/2014  . Mild obstructive sleep apnea 11/18/2013  . Insomnia 11/18/2013  . Trapezius muscle strain 09/07/2013  . Other social stressor 07/15/2013  . Sinus congestion 07/12/2013  . Toenail fungus 12/30/2012  . Obese 10/22/2012  . Abnormality of gait and mobility 08/18/2011  . Edema 08/12/2011    . Constipation 05/28/2011  . History of CVA (cerebrovascular accident) 05/20/2011  . Chest pain 05/20/2011  . Hot flashes 04/28/2011  . Headache 11/18/2010  . Long term (current) use of anticoagulants 09/14/2010  . CARPAL TUNNEL SYNDROME, LEFT 02/19/2010  . Diabetes mellitus out of control ([place]) 06/12/2009  . Atrial fibrillation ([place]) 06/07/2009  . Dermatophytosis of foot 03/13/2009  . Depression 09/29/2008  . Hyperlipidemia associated with type 2 diabetes mellitus ([place]) 08/22/2008  . CHRONIC KIDNEY DISEASE STAGE II (MILD) 08/01/2008  . STRESS INCONTINENCE 07/31/2008  . Essential hypertension 04/16/2007    Past Surgical History:  Procedure Laterality Date  . DILATION AND CURETTAGE OF UTERUS  1976  . MENISCUS REPAIR  03/09   right knee  . TONSILECTOMY, ADENOIDECTOMY, BILATERAL MYRINGOTOMY AND TUBES  age 64  . TONSILLECTOMY      OB History    No data available       Home Medications    Prior to Admission medications   Medication Sig Start Date End Date Taking? Authorizing Provider  albuterol (PROVENTIL HFA;VENTOLIN HFA) 108 (90 Base) MCG/ACT inhaler Inhale 2 puffs into the lungs every 6 (six) hours as needed for wheezing or shortness of breath. 12/10/15   Fredia Sorrow, MD  amLODipine (NORVASC) 10 MG tablet Take 1 tablet (10 mg total) by mouth daily. 09/08/16   Rosalin Hawking, MD  atorvastatin (LIPITOR) 80 MG tablet Take 1 tablet (80 mg  total) by mouth daily at 6 PM. 09/08/16   Rosalin Hawking, MD  calcipotriene (DOVONOX) 0.005 % cream  03/04/16   Historical Provider, MD  cloNIDine (CATAPRES) 0.2 MG tablet Take 1 tablet (0.2 mg total) by mouth 2 (two) times daily. 11/04/16   Verner Mould, MD  fluticasone (FLONASE) 50 MCG/ACT nasal spray Place 2 sprays into both nostrils daily. 11/04/16   Verner Mould, MD  glucose blood test strip Use as instructed 11/04/16   Verner Mould, MD  glucose blood test strip Use as instructed 11/04/16   Verner Mould, MD   insulin aspart (NOVOLOG) 100 UNIT/ML injection Inject 20 units under the skin with breakfast and 30 units under the skin with lunch 11/04/16   Verner Mould, MD  Insulin Glargine (LANTUS SOLOSTAR) 100 UNIT/ML Solostar Pen ADMINISTER 65 UNITS UNDER THE SKIN DAILY 11/04/16   Verner Mould, MD  liraglutide (VICTOZA) 18 MG/3ML SOPN Inject 0.3 mLs (1.8 mg total) into the skin daily. 11/04/16   Verner Mould, MD  lisinopril (PRINIVIL,ZESTRIL) 40 MG tablet TAKE 1 TABLET(40 MG) BY MOUTH DAILY 11/04/16   Verner Mould, MD  omeprazole (PRILOSEC) 20 MG capsule Take 1 capsule (20 mg total) by mouth 2 (two) times daily before a meal. 06/11/15   Verner Mould, MD  rivaroxaban (XARELTO) 20 MG TABS tablet Take 1 tablet (20 mg total) by mouth daily with supper. 11/04/16   Verner Mould, MD  spironolactone (ALDACTONE) 50 MG tablet Take 1 tablet (50 mg total) by mouth daily. 11/04/16   Verner Mould, MD    Family History Family History  Problem Relation Age of Onset  . Heart failure Mother   . Diabetes Father   . Hypertension Father   . Lung cancer Sister   . Stroke Brother     Social History Social History  Substance Use Topics  . Smoking status: Never Smoker  . Smokeless tobacco: Never Used  . Alcohol use No     Allergies   Propoxyphene n-acetaminophen; Glipizide; and Metformin and related   Review of Systems Review of Systems  Musculoskeletal: Positive for back pain.  All other systems reviewed and are negative.    Physical Exam Updated Vital Signs BP 131/81   Pulse 76   Temp 97.7 F (36.5 C) (Oral)   Ht 5\' 2"  (1.575 m)   Wt 238 lb (108 kg)   SpO2 99%   BMI 43.53 kg/m   Physical Exam  Constitutional: She is oriented to person, place, and time.  Uncomfortable   HENT:  Head: Normocephalic.  Mouth/Throat: Oropharynx is clear and moist.  Eyes: EOM are normal. Pupils are equal, round, and reactive to light.  Neck:  Normal range of motion. Neck supple.  Cardiovascular: Normal rate, regular rhythm and normal heart sounds.   Pulmonary/Chest: Effort normal and breath sounds normal. No respiratory distress. She has no wheezes. She has no rales.  Abdominal: Soft. Bowel sounds are normal.  nontender, no pulsatile mass.   Musculoskeletal:  R paralumbar tenderness, no CVAT. Nl pulses bilateral legs   Neurological: She is alert and oriented to person, place, and time.  No saddle anesthesia. + straight leg raise R leg, nl reflexes, nl strength bilateral lower extremities   Skin: Skin is warm.  Psychiatric: She has a normal mood and affect.  Nursing note and vitals reviewed.    ED Treatments / Results  Labs (all labs ordered are listed, but only abnormal results are displayed)  Labs Reviewed  CBC WITH DIFFERENTIAL/PLATELET - Abnormal; Notable for the following:       Result Value   Hemoglobin 11.3 (*)    HCT 35.8 (*)    MCV 71.0 (*)    MCH 22.4 (*)    RDW 16.0 (*)    All other components within normal limits  COMPREHENSIVE METABOLIC PANEL - Abnormal; Notable for the following:    Glucose, Bld 284 (*)    ALT 13 (*)    All other components within normal limits    EKG  EKG Interpretation None       Radiology Dg Lumbar Spine Complete  Result Date: 11/07/2016 CLINICAL DATA:  Worsening lower back pain. EXAM: LUMBAR SPINE - COMPLETE 4+ VIEW COMPARISON:  12/25/2008 FINDINGS: There is no evidence of lumbar spine fracture. There are mild multilevel osteoarthritic changes with joint space narrowing, mild remodeling of vertebral bodies and small osteophyte formation. There is posterior facet arthropathy, moderate-to-severe in the lower lumbosacral spine, at L3-L4, L4-L5 and L5-S1. There is mild posterior listhesis of T12 on L1, L1 on L2, L2 on L3, L3 on L4 and L4 on L5, likely caused by posterior facet arthropathy. IMPRESSION: No evidence of lumbosacral spine fracture. Moderate to severe posterior facet  arthropathy causing multilevel mild posterior listhesis. Milder degenerative disc disease. Electronically Signed   By: Fidela Salisbury [name]   On: 11/07/2016 12:30    Procedures Procedures (including critical care time)  Medications Ordered in ED Medications  morphine 4 MG/ML injection 4 mg (4 mg Intravenous Given 11/07/16 1126)  diazepam (VALIUM) injection 5 mg (5 mg Intravenous Given 11/07/16 1126)  HYDROcodone-acetaminophen (NORCO/VICODIN) 5-325 MG per tablet 2 tablet (2 tablets Oral Given 11/07/16 1338)     Initial Impression / Assessment and Plan / ED Course  I have reviewed the triage vital signs and the nursing notes.  Pertinent labs & imaging results that were available during my care of the patient were reviewed by me and considered in my medical decision making (see chart for details).     [name] is a 61 y.o. female here with R back pain with sciatica symptoms. Neurovascular intact. No abdominal pain or flank pain. Will check labs, xray lumbar spine. Will not need MRI. Will give pain meds and muscle relaxant and reassess.   2:10 PM Xrays showed multi level disease. Labs showed glucose 284, AG 13. She has diabetes. Felt better with pain meds, valium. Ambulatory at home. Will dc home with vicodin, flexeril, motrin. Told her to watch her blood sugar at home as it may be elevated from pain and stress. No heavy lifting.   Final Clinical Impressions(s) / ED Diagnoses   Final diagnoses:  None    New Prescriptions New Prescriptions   No medications on file     Drenda Freeze, MD 11/07/16 1414

## 2016-11-12 ENCOUNTER — Emergency Department (HOSPITAL_COMMUNITY)
Admission: EM | Admit: 2016-11-12 | Discharge: 2016-11-12 | Disposition: A | Discharge status: Home / Self Care | Payer: Medicare Other | Attending: Emergency Medicine | Admitting: Emergency Medicine

## 2016-11-12 ENCOUNTER — Encounter (HOSPITAL_COMMUNITY): Payer: Self-pay

## 2016-11-12 DIAGNOSIS — M545 Low back pain: Secondary | ICD-10-CM | POA: Diagnosis present

## 2016-11-12 DIAGNOSIS — I129 Hypertensive chronic kidney disease with stage 1 through stage 4 chronic kidney disease, or unspecified chronic kidney disease: Secondary | ICD-10-CM | POA: Diagnosis not present

## 2016-11-12 DIAGNOSIS — Z7901 Long term (current) use of anticoagulants: Secondary | ICD-10-CM | POA: Diagnosis not present

## 2016-11-12 DIAGNOSIS — Z8673 Personal history of transient ischemic attack (TIA), and cerebral infarction without residual deficits: Secondary | ICD-10-CM | POA: Insufficient documentation

## 2016-11-12 DIAGNOSIS — Z794 Long term (current) use of insulin: Secondary | ICD-10-CM | POA: Diagnosis not present

## 2016-11-12 DIAGNOSIS — Z79899 Other long term (current) drug therapy: Secondary | ICD-10-CM | POA: Diagnosis not present

## 2016-11-12 DIAGNOSIS — E119 Type 2 diabetes mellitus without complications: Secondary | ICD-10-CM | POA: Insufficient documentation

## 2016-11-12 DIAGNOSIS — N182 Chronic kidney disease, stage 2 (mild): Secondary | ICD-10-CM | POA: Diagnosis not present

## 2016-11-12 DIAGNOSIS — M5431 Sciatica, right side: Secondary | ICD-10-CM | POA: Diagnosis not present

## 2016-11-12 MED ORDER — KETOROLAC TROMETHAMINE 15 MG/ML IJ SOLN
15.0000 mg | Freq: Once | INTRAMUSCULAR | Status: AC
  Administered 2016-11-12: 15 mg via INTRAMUSCULAR
  Filled 2016-11-12: qty 1

## 2016-11-12 MED ORDER — LORAZEPAM 2 MG/ML IJ SOLN
1.0000 mg | Freq: Once | INTRAMUSCULAR | Status: AC
  Administered 2016-11-12: 1 mg via INTRAMUSCULAR
  Filled 2016-11-12: qty 1

## 2016-11-12 MED ORDER — DIAZEPAM 5 MG PO TABS: 2.5000 mg | ORAL_TABLET | Freq: Three times a day (TID) | ORAL | 0 refills | Status: DC | PRN

## 2016-11-12 MED ORDER — HALOPERIDOL LACTATE 5 MG/ML IJ SOLN
2.0000 mg | Freq: Once | INTRAMUSCULAR | Status: AC
  Administered 2016-11-12: 2 mg via INTRAMUSCULAR
  Filled 2016-11-12: qty 1

## 2016-11-12 MED ORDER — HYDROMORPHONE HCL 1 MG/ML IJ SOLN
1.0000 mg | Freq: Once | INTRAMUSCULAR | Status: AC
  Administered 2016-11-12: 1 mg via INTRAMUSCULAR
  Filled 2016-11-12: qty 1

## 2016-11-12 MED ORDER — OXYCODONE-ACETAMINOPHEN 5-325 MG PO TABS: 1.0000 | ORAL_TABLET | ORAL | 0 refills | Status: DC | PRN

## 2016-11-12 MED ORDER — HYDROMORPHONE HCL 1 MG/ML IJ SOLN
0.5000 mg | Freq: Once | INTRAMUSCULAR | Status: AC
  Administered 2016-11-12: 0.5 mg via INTRAVENOUS
  Filled 2016-11-12: qty 1

## 2016-11-12 NOTE — ED Triage Notes (Signed)
pt presents to the ed with complaints of pain in her back for greater than 1 week, states she has not injured her back and that this is chronic but she cant take it anymore and it is getting worse.  Patient states it is hard to ambulate due to the pain.  Denies any loss of control of bladder or bowel.

## 2016-11-12 NOTE — ED Provider Notes (Signed)
[facility] DEPT Provider Note   CSN: [id number] Arrival date & time: 11/12/16  1623  By signing my name below, I, [name], attest that this documentation has been prepared under the direction and in the presence of [name], MD. Electronically Signed: Reola [name], ED Scribe. 11/12/16. 6:02 PM.  History   Chief Complaint Chief Complaint  Patient presents with  . Back Pain   The history is provided by the patient and medical records. No language interpreter was used.    HPI Comments: [name] is a 61 y.o. female with a PMHx of AFIb, DDD, who presents to the Emergency Department complaining of persistent right-sided, lower back pain beginning one week ago. No radiation of pain. Per pt, she has a long standing h/o back pain which has acutely worsened over the past week. Her pain is exacerbated with generalized movements. Pt was seen in the ED for same on 11/07/16, at which time she was prescribed Vicodin, Flexeril, and Motrin which she has been taking with some relief of her pain; however, her pain returns several hours following. No PSHx to the back. No IVDU. Pt is currently anticoagulated on Xarelto therapy daily. Pt additionally notes that her sugars have been running higher recently, 380 by last check. Pt has been compliant with her prescribed Novalog and Lantus. She denies fever, bowel/bladder incontinence, numbness, or any other associated symptoms.   Past Medical History:  Diagnosis Date  . Atrial fibrillation ([place])   . 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 Diabetes mellitus    type 2  . Hypercholesteremia   . Hypertension   . Hypertension    Hypertensive urgency 05/2006   Patient Active Problem List   Diagnosis Date Noted  . BV (bacterial vaginosis)   . Stroke (cerebrum) ([place]) 02/16/2016  . Stroke ([place]) 02/16/2016  . Acute ischemic stroke ([place])   . Type 2 diabetes mellitus with circulatory disorder,  with long-term current use of insulin ([place])   . Paroxysmal atrial fibrillation ([facility])   . Upper back pain 10/12/2015  . Abdominal pain 06/11/2015  . Leg cramps, sleep related 06/11/2015  . Pain of right scapula 03/14/2015  . Dental abscess 12/07/2014  . Chronic dental pain 10/13/2014  . Intractable migraine without aura and without status migrainosus 02/21/2014  . Dyspnea 01/24/2014  . Mild obstructive sleep apnea 11/18/2013  . Insomnia 11/18/2013  . Trapezius muscle strain 09/07/2013  . Other social stressor 07/15/2013  . Sinus congestion 07/12/2013  . Toenail fungus 12/30/2012  . Obese 10/22/2012  . Abnormality of gait and mobility 08/18/2011  . Edema 08/12/2011  . Constipation 05/28/2011  . History of CVA (cerebrovascular accident) 05/20/2011  . Chest pain 05/20/2011  . Hot flashes 04/28/2011  . Headache 11/18/2010  . Long term (current) use of anticoagulants 09/14/2010  . CARPAL TUNNEL SYNDROME, LEFT 02/19/2010  . Diabetes mellitus out of control ([place]) 06/12/2009  . Atrial fibrillation ([place]) 06/07/2009  . Dermatophytosis of foot 03/13/2009  . Depression 09/29/2008  . Hyperlipidemia associated with type 2 diabetes mellitus ([place]) 08/22/2008  . CHRONIC KIDNEY DISEASE STAGE II ([facility]) 08/01/2008  . STRESS INCONTINENCE 07/31/2008  . Essential hypertension 04/16/2007   Past Surgical History:  Procedure Laterality Date  . DILATION AND CURETTAGE OF UTERUS  1976  . MENISCUS REPAIR  03/09   right knee  . TONSILECTOMY, ADENOIDECTOMY, BILATERAL MYRINGOTOMY AND TUBES  age 32  . TONSILLECTOMY     OB History  No data available     Home Medications    Prior to Admission medications   Medication Sig Start Date End Date Taking? Authorizing Provider  albuterol (PROVENTIL HFA;VENTOLIN HFA) 108 (90 Base) MCG/ACT inhaler Inhale 2 puffs into the lungs every 6 (six) hours as needed for wheezing or shortness of breath. 12/10/15   Fredia Sorrow, MD  amLODipine (NORVASC) 10 MG tablet  Take 1 tablet (10 mg total) by mouth daily. 09/08/16   Rosalin Hawking, MD  atorvastatin (LIPITOR) 80 MG tablet Take 1 tablet (80 mg total) by mouth daily at 6 PM. 09/08/16   Rosalin Hawking, MD  calcipotriene (DOVONOX) 0.005 % cream  03/04/16   Historical Provider, MD  cloNIDine (CATAPRES) 0.2 MG tablet Take 1 tablet (0.2 mg total) by mouth 2 (two) times daily. 11/04/16   Verner Mould, MD  cyclobenzaprine (FLEXERIL) 5 MG tablet Take 1 tablet (5 mg total) by mouth 3 (three) times daily as needed for muscle spasms. 11/07/16   Drenda Freeze, MD  fluticasone (FLONASE) 50 MCG/ACT nasal spray Place 2 sprays into both nostrils daily. 11/04/16   Verner Mould, MD  glucose blood test strip Use as instructed 11/04/16   Verner Mould, MD  glucose blood test strip Use as instructed 11/04/16   Verner Mould, MD  HYDROcodone-acetaminophen (NORCO/VICODIN) 5-325 MG tablet Take 1-2 tablets by mouth every 6 (six) hours as needed. 11/07/16   Drenda Freeze, MD  ibuprofen (ADVIL,MOTRIN) 600 MG tablet Take 1 tablet (600 mg total) by mouth every 6 (six) hours as needed. 11/07/16   Drenda Freeze, MD  insulin aspart (NOVOLOG) 100 UNIT/ML injection Inject 20 units under the skin with breakfast and 30 units under the skin with lunch 11/04/16   Verner Mould, MD  Insulin Glargine (LANTUS SOLOSTAR) 100 UNIT/ML Solostar Pen ADMINISTER 65 UNITS UNDER THE SKIN DAILY 11/04/16   Verner Mould, MD  liraglutide (VICTOZA) 18 MG/3ML SOPN Inject 0.3 mLs (1.8 mg total) into the skin daily. 11/04/16   Verner Mould, MD  lisinopril (PRINIVIL,ZESTRIL) 40 MG tablet TAKE 1 TABLET(40 MG) BY MOUTH DAILY 11/04/16   Verner Mould, MD  omeprazole (PRILOSEC) 20 MG capsule Take 1 capsule (20 mg total) by mouth 2 (two) times daily before a meal. 06/11/15   Verner Mould, MD  rivaroxaban (XARELTO) 20 MG TABS tablet Take 1 tablet (20 mg total) by mouth daily with supper. 11/04/16    Verner Mould, MD  spironolactone (ALDACTONE) 50 MG tablet Take 1 tablet (50 mg total) by mouth daily. 11/04/16   Verner Mould, MD   Family History Family History  Problem Relation Age of Onset  . Heart failure Mother   . Diabetes Father   . Hypertension Father   . Lung cancer Sister   . Stroke Brother    Social History Social History  Substance Use Topics  . Smoking status: Never Smoker  . Smokeless tobacco: Never Used  . Alcohol use No   Allergies   Propoxyphene n-acetaminophen; Glipizide; and Metformin and related  Review of Systems Review of Systems  Constitutional: Negative for fever.  Musculoskeletal: Positive for back pain and myalgias.  Neurological: Negative for numbness.       Negative for bowel/bladder incontinence.   All other systems reviewed and are negative.  Physical Exam Updated Vital Signs BP (!) 142/103   Pulse 91   Temp 98 F (36.7 C) (Oral)   Resp 18   Ht 5'  2" (1.575 m)   Wt 239 lb (108.4 kg)   SpO2 100%   BMI 43.71 kg/m   Physical Exam  Constitutional: She appears well-developed and well-nourished.  Appears uncomfortable   HENT:  Head: Normocephalic.  Right Ear: External ear normal.  Left Ear: External ear normal.  Nose: Nose normal.  Mouth/Throat: Oropharynx is clear and moist.  Eyes: Conjunctivae are normal. Right eye exhibits no discharge. Left eye exhibits no discharge.  Neck: Normal range of motion.  Abdominal: Soft. She exhibits no distension. There is no tenderness. There is no rebound and no guarding.  Musculoskeletal: Normal range of motion. She exhibits no edema or tenderness.  Tenderness along the lumbar spine midline and paraspinal. 5/5 strength to the BLE. Sensation intact to light tough. Palpable DP pulses.   Neurological: She is alert. No cranial nerve deficit. Coordination normal.  Skin: Skin is warm and dry. No rash noted. No erythema. No pallor.  Psychiatric: She has a normal mood and affect. Her  behavior is normal.  Nursing note and vitals reviewed.  ED Treatments / Results  DIAGNOSTIC STUDIES: Oxygen Saturation is 100% on RA, normal by my interpretation.   COORDINATION OF CARE: 5:58 PM-Discussed next steps with pt. Pt verbalized understanding and is agreeable with the plan.   Labs (all labs ordered are listed, but only abnormal results are displayed) Labs Reviewed - No data to display  EKG  EKG Interpretation None      Radiology No results found.  Procedures Procedures   Medications Ordered in ED Medications - No data to display  Initial Impression / Assessment and Plan / ED Course  I have reviewed the triage vital signs and the nursing notes.  Pertinent labs & imaging results that were available during my care of the patient were reviewed by me and considered in my medical decision making (see chart for details).    Back pain with nonfocal neuro exam. Afebrile.  No particularly concerning "red flags."  Final Clinical Impressions(s) / ED Diagnoses   Final diagnoses:  Sciatica of right side    New Prescriptions New Prescriptions   No medications on file   I personally preformed the services scribed in my presence. The recorded information has been reviewed is accurate. [name], MD.     [name], MD 11/20/16 [phone]

## 2016-11-18 ENCOUNTER — Emergency Department (HOSPITAL_COMMUNITY)
Admission: EM | Admit: 2016-11-18 | Discharge: 2016-11-19 | Disposition: A | Discharge status: Home / Self Care | Payer: Medicare Other | Attending: Emergency Medicine | Admitting: Emergency Medicine

## 2016-11-18 ENCOUNTER — Emergency Department (HOSPITAL_COMMUNITY): Payer: Medicare Other

## 2016-11-18 ENCOUNTER — Encounter (HOSPITAL_COMMUNITY): Payer: Self-pay | Admitting: Emergency Medicine

## 2016-11-18 DIAGNOSIS — E119 Type 2 diabetes mellitus without complications: Secondary | ICD-10-CM | POA: Insufficient documentation

## 2016-11-18 DIAGNOSIS — I10 Essential (primary) hypertension: Secondary | ICD-10-CM | POA: Diagnosis not present

## 2016-11-18 DIAGNOSIS — Z794 Long term (current) use of insulin: Secondary | ICD-10-CM | POA: Insufficient documentation

## 2016-11-18 DIAGNOSIS — Z7901 Long term (current) use of anticoagulants: Secondary | ICD-10-CM | POA: Insufficient documentation

## 2016-11-18 DIAGNOSIS — M25561 Pain in right knee: Secondary | ICD-10-CM

## 2016-11-18 DIAGNOSIS — Z8673 Personal history of transient ischemic attack (TIA), and cerebral infarction without residual deficits: Secondary | ICD-10-CM | POA: Diagnosis not present

## 2016-11-18 DIAGNOSIS — M25551 Pain in right hip: Secondary | ICD-10-CM | POA: Insufficient documentation

## 2016-11-18 DIAGNOSIS — Z79899 Other long term (current) drug therapy: Secondary | ICD-10-CM | POA: Diagnosis not present

## 2016-11-18 LAB — URINALYSIS, ROUTINE W REFLEX MICROSCOPIC
BILIRUBIN URINE: NEGATIVE
Glucose, UA: >=500 mg/dL — AB
Ketones, ur: NEGATIVE mg/dL
NITRITE: NEGATIVE
PH: 6 (ref 5.0–8.0)
Protein, ur: NEGATIVE mg/dL
SPECIFIC GRAVITY, URINE: 1.021 (ref 1.005–1.030)

## 2016-11-18 LAB — CBC
HCT: 37.4 % (ref 36.0–46.0)
Hemoglobin: 12 g/dL (ref 12.0–15.0)
MCH: 22.5 pg — ABNORMAL LOW (ref 26.0–34.0)
MCHC: 32.1 g/dL (ref 30.0–36.0)
MCV: 70.2 fL — ABNORMAL LOW (ref 78.0–100.0)
Platelets: 391 10*3/uL (ref 150–400)
RBC: 5.33 MIL/uL — ABNORMAL HIGH (ref 3.87–5.11)
RDW: 15.5 % (ref 11.5–15.5)
WBC: 8.1 10*3/uL (ref 4.0–10.5)

## 2016-11-18 LAB — BASIC METABOLIC PANEL
Anion gap: 13 (ref 5–15)
BUN: 12 mg/dL (ref 6–20)
CALCIUM: 9.9 mg/dL (ref 8.9–10.3)
CO2: 23 mmol/L (ref 22–32)
CREATININE: 1.06 mg/dL — AB (ref 0.44–1.00)
Chloride: 103 mmol/L (ref 101–111)
GFR calc Af Amer: >60 mL/min (ref >60)
GFR, EST NON AFRICAN AMERICAN: 55 mL/min — AB (ref >60)
Glucose, Bld: 257 mg/dL — ABNORMAL HIGH (ref 65–99)
Potassium: 4 mmol/L (ref 3.5–5.1)
Sodium: 139 mmol/L (ref 135–145)

## 2016-11-18 LAB — CBG MONITORING, ED
GLUCOSE-CAPILLARY: 179 mg/dL — AB (ref 65–99)
Glucose-Capillary: 261 mg/dL — ABNORMAL HIGH (ref 65–99)

## 2016-11-18 MED ORDER — HYDROMORPHONE HCL 1 MG/ML IJ SOLN
1.0000 mg | Freq: Once | INTRAMUSCULAR | Status: AC
  Administered 2016-11-18: 1 mg via INTRAMUSCULAR
  Filled 2016-11-18: qty 1

## 2016-11-18 NOTE — ED Triage Notes (Signed)
Pt arrives with back pain radiating to R hip and knee ongoing x2 weeks. States she can only walk a few steps at a time d/t the pain. Also reports hyperglycemia, CBG at home 400.

## 2016-11-18 NOTE — ED Provider Notes (Signed)
[facility] DEPT Provider Note   CSN: [id number] Arrival date & time: 11/18/16  1911     History   Chief Complaint Chief Complaint  Patient presents with  . Hyperglycemia  . Back Pain    HPI [name] is a 61 y.o. female.  The history is provided by the patient and medical records.  Hyperglycemia  Back Pain       61 y.o. F with hx of AFIB, CVA, DM, HTN, hypercholesterolemia, presenting to the ED for right hip and leg pain.  States initially she was having pain in her back a few weeks ago, but states now it is her right hip and knee.  States when she walks she feels grinding in her hip and knee.  States it makes it very painful to walk so she has been using her neighbors rolling walker.  Denies any back pain at present.  No numbness/weakness of her legs.  No bowel or bladder incontinence.  States she did have imaging of her back a few weeks ago which she was told had some arthritis, but did not have any imaging of her hip or knee.  She has not had any new falls or trauma.  States she has been taking the pain medication she received from the ER  Which did help however she has since run out.  States she has started using some muscle rub but has not noticed any improvement.  Patient also reports her sugar has been running somewhat high. She's been compliant with her medications. States she may have eaten more carbs and sugar recently.  No recent steroid use.  Denies urinary symptoms or frequency.  No excessive thirst.  Past Medical History:  Diagnosis Date  . Atrial fibrillation ([place])   . 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 Diabetes mellitus    type 2  . Hypercholesteremia   . Hypertension   . Hypertension    Hypertensive urgency 05/2006    Patient Active Problem List   Diagnosis Date Noted  . BV (bacterial vaginosis)   . Stroke (cerebrum) ([place]) 02/16/2016  . Stroke ([place]) 02/16/2016  . Acute ischemic stroke ([place])   . Type 2  diabetes mellitus with circulatory disorder, with long-term current use of insulin ([place])   . Paroxysmal atrial fibrillation ([facility])   . Upper back pain 10/12/2015  . Abdominal pain 06/11/2015  . Leg cramps, sleep related 06/11/2015  . Pain of right scapula 03/14/2015  . Dental abscess 12/07/2014  . Chronic dental pain 10/13/2014  . Intractable migraine without aura and without status migrainosus 02/21/2014  . Dyspnea 01/24/2014  . Mild obstructive sleep apnea 11/18/2013  . Insomnia 11/18/2013  . Trapezius muscle strain 09/07/2013  . Other social stressor 07/15/2013  . Sinus congestion 07/12/2013  . Toenail fungus 12/30/2012  . Obese 10/22/2012  . Abnormality of gait and mobility 08/18/2011  . Edema 08/12/2011  . Constipation 05/28/2011  . History of CVA (cerebrovascular accident) 05/20/2011  . Chest pain 05/20/2011  . Hot flashes 04/28/2011  . Headache 11/18/2010  . Long term (current) use of anticoagulants 09/14/2010  . CARPAL TUNNEL SYNDROME, LEFT 02/19/2010  . Diabetes mellitus out of control ([place]) 06/12/2009  . Atrial fibrillation ([place]) 06/07/2009  . Dermatophytosis of foot 03/13/2009  . Depression 09/29/2008  . Hyperlipidemia associated with type 2 diabetes mellitus ([place]) 08/22/2008  . CHRONIC KIDNEY DISEASE STAGE II ([facility]) 08/01/2008  . STRESS INCONTINENCE 07/31/2008  . Essential hypertension 04/16/2007  Past Surgical History:  Procedure Laterality Date  . DILATION AND CURETTAGE OF UTERUS  1976  . MENISCUS REPAIR  03/09   right knee  . TONSILECTOMY, ADENOIDECTOMY, BILATERAL MYRINGOTOMY AND TUBES  age 45  . TONSILLECTOMY      OB History    No data available       Home Medications    Prior to Admission medications   Medication Sig Start Date End Date Taking? Authorizing Provider  albuterol (PROVENTIL HFA;VENTOLIN HFA) 108 (90 Base) MCG/ACT inhaler Inhale 2 puffs into the lungs every 6 (six) hours as needed for wheezing or shortness of breath. 12/10/15   Fredia Sorrow, MD  amLODipine (NORVASC) 10 MG tablet Take 1 tablet (10 mg total) by mouth daily. 09/08/16   Rosalin Hawking, MD  atorvastatin (LIPITOR) 80 MG tablet Take 1 tablet (80 mg total) by mouth daily at 6 PM. 09/08/16   Rosalin Hawking, MD  calcipotriene (DOVONOX) 0.005 % cream  03/04/16   Historical Provider, MD  cloNIDine (CATAPRES) 0.2 MG tablet Take 1 tablet (0.2 mg total) by mouth 2 (two) times daily. 11/04/16   Verner Mould, MD  cyclobenzaprine (FLEXERIL) 5 MG tablet Take 1 tablet (5 mg total) by mouth 3 (three) times daily as needed for muscle spasms. 11/07/16   Drenda Freeze, MD  diazepam (VALIUM) 5 MG tablet Take 0.5-1 tablets (2.5-5 mg total) by mouth every 8 (eight) hours as needed for anxiety. 11/12/16   [name], MD  fluticasone (FLONASE) 50 MCG/ACT nasal spray Place 2 sprays into both nostrils daily. 11/04/16   Verner Mould, MD  glucose blood test strip Use as instructed 11/04/16   Verner Mould, MD  glucose blood test strip Use as instructed 11/04/16   Verner Mould, MD  HYDROcodone-acetaminophen (NORCO/VICODIN) 5-325 MG tablet Take 1-2 tablets by mouth every 6 (six) hours as needed. 11/07/16   Drenda Freeze, MD  ibuprofen (ADVIL,MOTRIN) 600 MG tablet Take 1 tablet (600 mg total) by mouth every 6 (six) hours as needed. 11/07/16   Drenda Freeze, MD  insulin aspart (NOVOLOG) 100 UNIT/ML injection Inject 20 units under the skin with breakfast and 30 units under the skin with lunch 11/04/16   Verner Mould, MD  Insulin Glargine (LANTUS SOLOSTAR) 100 UNIT/ML Solostar Pen ADMINISTER 65 UNITS UNDER THE SKIN DAILY 11/04/16   Verner Mould, MD  liraglutide (VICTOZA) 18 MG/3ML SOPN Inject 0.3 mLs (1.8 mg total) into the skin daily. 11/04/16   Verner Mould, MD  lisinopril (PRINIVIL,ZESTRIL) 40 MG tablet TAKE 1 TABLET(40 MG) BY MOUTH DAILY 11/04/16   Verner Mould, MD  omeprazole (PRILOSEC) 20 MG capsule Take 1 capsule (20 mg  total) by mouth 2 (two) times daily before a meal. 06/11/15   Verner Mould, MD  oxyCODONE-acetaminophen (PERCOCET/ROXICET) 5-325 MG tablet Take 1-2 tablets by mouth every 4 (four) hours as needed for severe pain. 11/12/16   [name], MD  rivaroxaban (XARELTO) 20 MG TABS tablet Take 1 tablet (20 mg total) by mouth daily with supper. 11/04/16   Verner Mould, MD  spironolactone (ALDACTONE) 50 MG tablet Take 1 tablet (50 mg total) by mouth daily. 11/04/16   Verner Mould, MD    Family History Family History  Problem Relation Age of Onset  . Heart failure Mother   . Diabetes Father   . Hypertension Father   . Lung cancer Sister   . Stroke Brother     Social History  Social History  Substance Use Topics  . Smoking status: Never Smoker  . Smokeless tobacco: Never Used  . Alcohol use No     Allergies   Propoxyphene n-acetaminophen; Glipizide; and Metformin and related   Review of Systems Review of Systems  Musculoskeletal: Positive for arthralgias (right hip and knee).  All other systems reviewed and are negative.    Physical Exam Updated Vital Signs BP (!) 177/103 (BP Location: Left Arm)   Pulse 99   Temp 98.5 F (36.9 C) (Oral)   Resp 20   Ht 5\' 2"  (1.575 m)   Wt 103.9 kg   SpO2 100%   BMI 41.88 kg/m   Physical Exam  Constitutional: She is oriented to person, place, and time. She appears well-developed and well-nourished.  HENT:  Head: Normocephalic and atraumatic.  Mouth/Throat: Oropharynx is clear and moist.  Eyes: Conjunctivae and EOM are normal. Pupils are equal, round, and reactive to light.  Neck: Normal range of motion.  Cardiovascular: Normal rate, regular rhythm and normal heart sounds.   Pulmonary/Chest: Effort normal and breath sounds normal. No respiratory distress. She has no wheezes.  Abdominal: Soft. Bowel sounds are normal. There is no tenderness. There is no rebound and no CVA tenderness.  Soft, benign, no CVA  tenderness  Musculoskeletal: Normal range of motion.  LS non-tender, no noted spasm Pain with attempted flexion/extension of the right hip/knee; there is some crepitus in the knee; no bony deformities or signs of trauma noted; no leg shortening; DP pulses intact; normal sensation throughout both legs  Neurological: She is alert and oriented to person, place, and time.  Skin: Skin is warm and dry.  Psychiatric: She has a normal mood and affect.  Nursing note and vitals reviewed.    ED Treatments / Results  Labs (all labs ordered are listed, but only abnormal results are displayed) Labs Reviewed  BASIC METABOLIC PANEL - Abnormal; Notable for the following:       Result Value   Glucose, Bld 257 (*)    Creatinine, Ser 1.06 (*)    GFR calc non Af Amer 55 (*)    All other components within normal limits  CBC - Abnormal; Notable for the following:    RBC 5.33 (*)    MCV 70.2 (*)    MCH 22.5 (*)    All other components within normal limits  URINALYSIS, ROUTINE W REFLEX MICROSCOPIC - Abnormal; Notable for the following:    Glucose, UA >=500 (*)    Hgb urine dipstick SMALL (*)    Leukocytes, UA MODERATE (*)    Bacteria, UA FEW (*)    Squamous Epithelial / LPF 0-5 (*)    All other components within normal limits  CBG MONITORING, ED - Abnormal; Notable for the following:    Glucose-Capillary 261 (*)    All other components within normal limits  CBG MONITORING, ED - Abnormal; Notable for the following:    Glucose-Capillary 179 (*)    All other components within normal limits  URINE CULTURE    EKG  EKG Interpretation None       Radiology Dg Knee Complete 4 Views Right  Result Date: 11/18/2016 CLINICAL DATA:  Initial evaluation for acute right hip pain radiating to right knee. No injury. EXAM: RIGHT KNEE - COMPLETE 4+ VIEW COMPARISON:  Prior MRI from 09/05/2007. FINDINGS: No acute fracture or dislocation. No joint effusion. Mild to moderate tricompartmental degenerative  osteoarthrosis, greatest within the medial femoral tibial and patellofemoral joint space compartments.  Osseous mineralization normal. No soft tissue abnormality. IMPRESSION: 1. No acute osseous abnormality about the right knee. 2. Mild to moderate tricompartmental degenerative osteoarthrosis. Electronically Signed   By: Jeannine Boga [name]   On: 11/18/2016 23:54   Dg Hip Unilat W Or Wo Pelvis 2-3 Views Right  Result Date: 11/18/2016 CLINICAL DATA:  Initial evaluation for acute right hip pain radiating to right knee for 1 week. No injury. EXAM: DG HIP (WITH OR WITHOUT PELVIS) 2-3V RIGHT COMPARISON:  None. FINDINGS: No acute fracture or dislocation. Femoral heads in normal alignment within the acetabula. Femoral head heights preserved. Bony pelvis intact. SI joints approximated. Mild asymmetric sclerosis noted about the right SI joint. Moderate degenerative osteoarthrosis noted about the hips bilaterally. Advanced degenerative changes present within the lower lumbar spine. No soft tissue abnormality. IMPRESSION: 1. No acute osseous abnormality about the right hip. 2. Moderate degenerative osteoarthrosis about the hips bilaterally, with prominent degenerative changes within the lower lumbar spine. Electronically Signed   By: Jeannine Boga [name]   On: 11/18/2016 23:51    Procedures Procedures (including critical care time)  Medications Ordered in ED Medications  HYDROmorphone (DILAUDID) injection 1 mg (1 mg Intramuscular Given 11/18/16 2245)  oxyCODONE-acetaminophen (PERCOCET/ROXICET) 5-325 MG per tablet 2 tablet (2 tablets Oral Given 11/19/16 0142)     Initial Impression / Assessment and Plan / ED Course  I have reviewed the triage vital signs and the nursing notes.  Pertinent labs & imaging results that were available during my care of the patient were reviewed by me and considered in my medical decision making (see chart for details).  61 year old female here with right hip and knee  pain. Triage note reports back pain, however patient denies this to me. She has been seen recently for back pain, but denies this currently.  Reports a "grinding" sensation in her right hip and knee with movement. Does have some crepitus of the knee with attempted range of motion on exam, however no bony deformities or leg shortening. Legs are neurovascularly intact bilaterally.  Screening lab work obtained from triage that are overall reassuring. Her glucose is elevated but bicarband anion gap remains normal. No ketones in the urine. Clinically consistent with DKA. UA does have some white blood cells, however only few bacteria noted. Patient denies any current urinary symptoms, no flank or abdominal pain. Will send for culture. I did obtain x-rays of her right hip and knee which reveal degenerative changes which may be contributing to her symptoms. Will start her on low dose meloxicam, short supply pain medication given. I've encouraged her to follow-up closely with her primary care doctor. I've also given her referral to orthopedics if symptoms persist.  Discussed plan with patient, he/she acknowledged understanding and agreed with plan of care.  Return precautions given for new or worsening symptoms.  Final Clinical Impressions(s) / ED Diagnoses   Final diagnoses:  Right hip pain  Acute pain of right knee    New Prescriptions Discharge Medication List as of 11/19/2016  1:34 AM    START taking these medications   Details  meloxicam (MOBIC) 7.5 MG tablet Take 1 tablet (7.5 mg total) by mouth daily., Starting Wed 11/19/2016, Print         Larene Pickett, PA-C 11/19/16 [place], MD 11/19/16 1504

## 2016-11-18 NOTE — ED Notes (Signed)
Patient transported to X-ray 

## 2016-11-19 MED ORDER — HYDROCODONE-ACETAMINOPHEN 5-325 MG PO TABS: 1.0000 | ORAL_TABLET | ORAL | 0 refills | Status: DC | PRN

## 2016-11-19 MED ORDER — MELOXICAM 7.5 MG PO TABS: 7.5000 mg | ORAL_TABLET | Freq: Every day | ORAL | 0 refills | Status: DC

## 2016-11-19 MED ORDER — OXYCODONE-ACETAMINOPHEN 5-325 MG PO TABS
2.0000 | ORAL_TABLET | Freq: Once | ORAL | Status: AC
  Administered 2016-11-19: 2 via ORAL
  Filled 2016-11-19: qty 2

## 2016-11-19 NOTE — Discharge Instructions (Signed)
Take the prescribed medication as directed. Follow-up with your primary care doctor. May also be of some benefit to see an orthopedist-- can call to make appt. Return to the ED for new or worsening symptoms.

## 2016-11-24 DIAGNOSIS — M545 Low back pain: Secondary | ICD-10-CM | POA: Diagnosis not present

## 2016-12-09 ENCOUNTER — Encounter: Payer: Self-pay | Admitting: Internal Medicine

## 2016-12-09 ENCOUNTER — Ambulatory Visit (INDEPENDENT_AMBULATORY_CARE_PROVIDER_SITE_OTHER): Payer: Medicare Other | Admitting: Internal Medicine

## 2016-12-09 DIAGNOSIS — E1159 Type 2 diabetes mellitus with other circulatory complications: Secondary | ICD-10-CM | POA: Diagnosis not present

## 2016-12-09 DIAGNOSIS — R0981 Nasal congestion: Secondary | ICD-10-CM

## 2016-12-09 DIAGNOSIS — I10 Essential (primary) hypertension: Secondary | ICD-10-CM

## 2016-12-09 DIAGNOSIS — I63411 Cerebral infarction due to embolism of right middle cerebral artery: Secondary | ICD-10-CM

## 2016-12-09 DIAGNOSIS — E1165 Type 2 diabetes mellitus with hyperglycemia: Secondary | ICD-10-CM

## 2016-12-09 MED ORDER — CETIRIZINE HCL 10 MG PO TABS: 10.0000 mg | ORAL_TABLET | Freq: Every day | ORAL | 11 refills | Status: DC

## 2016-12-09 MED ORDER — AMOXICILLIN-POT CLAVULANATE 875-125 MG PO TABS: 1.0000 | ORAL_TABLET | Freq: Two times a day (BID) | ORAL | 0 refills | Status: DC

## 2016-12-09 NOTE — Patient Instructions (Addendum)
It was nice seeing you again today [name]!  For your nasal congestion, please begin taking the antibiotic (Augmentin) every 12 hours for the next 10 days. Also please begin taking Zyrtec every day, in addition to using the Flonase as you have been.   It is very important to take ALL of your diabetes medications EVERY SINGLE DAY. If your blood sugars have not improved at your next appointment, we will need to increase your insulin.   I will see you back in one month to follow-up on your diabetes.   If you have any questions or concerns, please feel free to call the clinic.   Be well,  Dr. Avon Gully

## 2016-12-09 NOTE — Progress Notes (Signed)
Subjective:   Patient: [name]       Birthdate: 02/23/1956       MRN: [id number]      HPI  TAHEERAH GULDIN is a 61 y.o. female presenting for Type II DM and HTN f/u.   Type II DM Currently prescribed Lantus 65U with breakfast, Victoza 1.8mg  qhs, and Novolog 20U with breakfast, 30U with lunch. She has been taking both Lantus and Novolog as prescribed, but is not taking Victoza daily. She misses up to 4 times per week. She says she simply forgets to take it but knows that it is important. She feels that this is why her blood sugars remain elevated. Measures blood sugar in the mornings typically before breakfast. Normal numbers are in high 200s. Highest was 343, lowest 89. She is no longer performing weekly foot checks as she was at last visit. She has been trying to cut down on sweets with the help of her granddaughter. She has not seen ophthalmology but says that her vision has not worsened since last visit.   HTN Currently taking lisinopril, amlodipine, clonidine, and spironolactone. Endorses blurry vision but not new and unchanged from last visit. Denies headaches.   Nasal congestion Worsened from last visit one month ago despite using Flonase daily as prescribed. Denies sneezing or coughing, just nasal congestion. Denies sinus pressure or headaches. Endorses sore throat. Endorses fevers at night, but highest reported temp 100.69F. Endorses decreased smell but normal sense of taste. Has difficulty breathing when mouth is closed. Reports dry mouth as well since she has to breathe through her mouth only.   Smoking status reviewed. Patient is never smoker.   Review of Systems See HPI.     Objective:  Physical Exam  Constitutional: She is oriented to person, place, and time.  Pleasant obese female in NAD  HENT:  No TTP of sinuses. No nasal discharge present. Oropharynx erythematous but no exudates noted. MMM. NCAT.   Eyes: Conjunctivae and EOM are normal. Right eye exhibits no  discharge. Left eye exhibits no discharge.  Arcus senilis present bilaterally  Cardiovascular: Normal rate, regular rhythm and normal heart sounds.   No murmur heard. Pulmonary/Chest:  Transmitted upper airway noises. Otherwise CTAB. Normal WOB on RA.   Abdominal:  Obese  Neurological: She is alert and oriented to person, place, and time.  Skin: Skin is warm and dry.  Psychiatric: Affect and judgment normal.   Diabetic Foot Exam - Simple   Simple Foot Form Diabetic Foot exam was performed with the following findings:  Yes 12/09/2016  3:35 PM  Visual Inspection No deformities, no ulcerations, no other skin breakdown bilaterally:  Yes Sensation Testing Intact to touch and monofilament testing bilaterally:  Yes Pulse Check Posterior Tibialis and Dorsalis pulse intact bilaterally:  Yes Comments        Assessment & Plan:  Diabetes mellitus out of control ([place]) Still with poor glycemic control, with average fasting blood sugar in mid to high 200s. No episodes of hypoglycemia. Persistent hyperglycemia likely due at least in part to non-adherence to medications, as patient not taking Victoza. Is taking insulin as prescribed now, which she was not doing at last visit. Still has not scheduled appt with ophtho. Patient asking to try taking all medications as prescribed for an additional month, then agreeable to increasing insulin if blood sugars still elevated at that time. Foot exam with no signs of neuropathy today.  - Continue Lantus 65U - Continue Novolog 30U breakfast, 20U lunch -  Resume Victoza 1.8mg  qhs - Stressed importance of taking all medications daily - F/u in one month. Will increase insulin at that time if CBGs still elevated.  - Next A1C in two months - Stressed importance of scheduling ophtho appt asap  Sinus congestion No improvement in past month despite Flonase usage. No accompanying symptoms other than dry mouth from breathing through mouth. Not entirely consistent with  bacterial sinusitis, as no sinus pressure or pain and no fevers, however given persistence with treat with course of antibiotics. Will also treat with Zyrtec for possible allergic component.  - Augmentin BID x10d - Zyrtec - Continue Flonase  Essential hypertension BP at goal today.  - Continue current med regimen   Adin Hector, MD, MPH PGY-2 [facility] Pager [phone]

## 2016-12-09 NOTE — Assessment & Plan Note (Signed)
Still with poor glycemic control, with average fasting blood sugar in mid to high 200s. No episodes of hypoglycemia. Persistent hyperglycemia likely due at least in part to non-adherence to medications, as patient not taking Victoza. Is taking insulin as prescribed now, which she was not doing at last visit. Still has not scheduled appt with ophtho. Patient asking to try taking all medications as prescribed for an additional month, then agreeable to increasing insulin if blood sugars still elevated at that time. Foot exam with no signs of neuropathy today.  - Continue Lantus 65U - Continue Novolog 30U breakfast, 20U lunch - Resume Victoza 1.8mg  qhs - Stressed importance of taking all medications daily - F/u in one month. Will increase insulin at that time if CBGs still elevated.  - Next A1C in two months - Stressed importance of scheduling ophtho appt asap

## 2016-12-09 NOTE — Assessment & Plan Note (Signed)
BP at goal today.  - Continue current med regimen

## 2016-12-09 NOTE — Assessment & Plan Note (Signed)
No improvement in past month despite Flonase usage. No accompanying symptoms other than dry mouth from breathing through mouth. Not entirely consistent with bacterial sinusitis, as no sinus pressure or pain and no fevers, however given persistence with treat with course of antibiotics. Will also treat with Zyrtec for possible allergic component.  - Augmentin BID x10d - Zyrtec - Continue Flonase

## 2017-01-12 ENCOUNTER — Ambulatory Visit: Payer: Medicare Other | Admitting: Internal Medicine

## 2017-03-05 NOTE — Progress Notes (Signed)
[facility]  PATIENT: [name] DOB: Jun 12, 1956   REASON FOR VISIT: Follow-up for history of stroke and atrial fibrillation HISTORY FROM: Patient    HISTORY OF PRESENT ILLNESS: History Summary Ms.Carla Macfarlane Cauthenis a 60 y.o.femalewith history of atrial fibrillation (patient stopped Xarelto secondary to cost), hypertension, hyperlipidemia, previous strokes, and diabetes mellitus as well as LBPadmitted on 02/16/16 for palpitations and mild left pronatordrift.MRI showed right external capsule acute infarct as well as remote lacunar infarcts at b/l thalami and right midbrain and left pons. MRA, CUS, TTE unremarkable, LDL 88 and A1C 9.3. She was restarted on Xarelto and put on lipitor 80mg . She was discharged in good condition.   04/23/16 follow up - the patient has been doing better.  Glucose still fluctuate 120-320. Now following up with endocrinologist. BP still a little high, today 152/72. Not checking BP at home. On Xarelto, compliant with medication. Not able to do more exercise due to LBP. Has appointment with cardiology in 06/2016.   Interval History2/5/18 Dr. Erlinda [name] During the interval time, pt has been doing well. No stroke like symptoms. However, yesterday pt had two episode of nose bleed, not severe, stopped on her own. That was the first time bleeding since on Xarelto. I recommend her to continue Xarelto and close monitoring with nose moisture techniques. Following with endocrinologist and glucose better, but still not normal. BP today 156/86 and she did not check BP at home. Follows with cardiology for afib management.  UPDATE 08/03/2018[name], 61 year old female returns for follow-up with history of stroke in July 2017. Patient had stopped her Xarelto at the time due to cost for her atrial fibrillation.. She remains on Xarelto without recurrent stroke like symptoms. She has no bruising and bleeding. No further nosebleeds His blood pressure in the  office today 118/76. She remains only for Lipitor. Her diabetes remains out of control her most recent hemoglobin A1c 14.1 on 11/04/2016. She does not follow a diabetic diet. She does little exercise. She ambulates with single-point cane no recent falls. She returns for reevaluation.  REVIEW OF SYSTEMS: Full 14 system review of systems performed and notable only for those listed, all others are neg:  Constitutional: neg  Cardiovascular: neg Ear/Nose/Throat: neg  Skin: neg Eyes: neg Respiratory: neg Gastroitestinal: neg  Hematology/Lymphatic: neg  Endocrine: neg Musculoskeletal: Back pain Allergy/Immunology: neg Neurological: neg Psychiatric: Depression and anxiety Sleep : neg   ALLERGIES: Allergies  Allergen Reactions  . Propoxyphene N-Acetaminophen Hives, Itching, Swelling and Other (See Comments)    REACTION: Hallucinations  . Glipizide Nausea And Vomiting  . Metformin And Related Diarrhea    HOME MEDICATIONS: Outpatient Medications Prior to Visit  Medication Sig Dispense Refill  . albuterol (PROVENTIL HFA;VENTOLIN HFA) 108 (90 Base) MCG/ACT inhaler Inhale 2 puffs into the lungs every 6 (six) hours as needed for wheezing or shortness of breath. 1 Inhaler 2  . amLODipine (NORVASC) 10 MG tablet Take 1 tablet (10 mg total) by mouth daily. 90 tablet 3  . amoxicillin-clavulanate (AUGMENTIN) 875-125 MG tablet Take 1 tablet by mouth 2 (two) times daily. 20 tablet 0  . atorvastatin (LIPITOR) 80 MG tablet Take 1 tablet (80 mg total) by mouth daily at 6 PM. 90 tablet 3  . calcipotriene (DOVONOX) 0.005 % cream     . cloNIDine (CATAPRES) 0.2 MG tablet Take 1 tablet (0.2 mg total) by mouth 2 (two) times daily. 180 tablet 3  . cyclobenzaprine (FLEXERIL) 5 MG tablet Take 1 tablet (5 mg total) by  mouth 3 (three) times daily as needed for muscle spasms. 10 tablet 0  . fluticasone (FLONASE) 50 MCG/ACT nasal spray Place 2 sprays into both nostrils daily. 16 g 6  . glucose blood test strip Use  as instructed 120 each 12  . glucose blood test strip Use as instructed 100 each 12  . insulin aspart (NOVOLOG) 100 UNIT/ML injection Inject 20 units under the skin with breakfast and 30 units under the skin with lunch 10 mL 2  . Insulin Glargine (LANTUS SOLOSTAR) 100 UNIT/ML Solostar Pen ADMINISTER 65 UNITS UNDER THE SKIN DAILY 15 mL 2  . liraglutide (VICTOZA) 18 MG/3ML SOPN Inject 0.3 mLs (1.8 mg total) into the skin daily. 6 mL 2  . lisinopril (PRINIVIL,ZESTRIL) 40 MG tablet TAKE 1 TABLET(40 MG) BY MOUTH DAILY 90 tablet 3  . meloxicam (MOBIC) 7.5 MG tablet Take 1 tablet (7.5 mg total) by mouth daily. 14 tablet 0  . rivaroxaban (XARELTO) 20 MG TABS tablet Take 1 tablet (20 mg total) by mouth daily with supper. 90 tablet 3  . spironolactone (ALDACTONE) 50 MG tablet Take 1 tablet (50 mg total) by mouth daily. 90 tablet 3  . cetirizine (ZYRTEC) 10 MG tablet Take 1 tablet (10 mg total) by mouth daily. (Patient not taking: Reported on 03/06/2017) 30 tablet 11  . diazepam (VALIUM) 5 MG tablet Take 0.5-1 tablets (2.5-5 mg total) by mouth every 8 (eight) hours as needed for anxiety. 10 tablet 0  . HYDROcodone-acetaminophen (NORCO/VICODIN) 5-325 MG tablet Take 1 tablet by mouth every 4 (four) hours as needed. 10 tablet 0  . ibuprofen (ADVIL,MOTRIN) 600 MG tablet Take 1 tablet (600 mg total) by mouth every 6 (six) hours as needed. 30 tablet 0  . omeprazole (PRILOSEC) 20 MG capsule Take 1 capsule (20 mg total) by mouth 2 (two) times daily before a meal. 60 capsule 2  . oxyCODONE-acetaminophen (PERCOCET/ROXICET) 5-325 MG tablet Take 1-2 tablets by mouth every 4 (four) hours as needed for severe pain. 12 tablet 0   No facility-administered medications prior to visit.     PAST MEDICAL HISTORY: Past Medical History:  Diagnosis Date  . Atrial fibrillation ([place])   . CVA (cerebral vascular accident) ([place])    left pontine and frontal lobe  . Diabetes mellitus    type 2  . Hypercholesteremia   . Hypertension    . Hypertension    Hypertensive urgency 05/2006    PAST SURGICAL HISTORY: Past Surgical History:  Procedure Laterality Date  . DILATION AND CURETTAGE OF UTERUS  1976  . MENISCUS REPAIR  03/09   right knee  . TONSILECTOMY, ADENOIDECTOMY, BILATERAL MYRINGOTOMY AND TUBES  age 6  . TONSILLECTOMY      FAMILY HISTORY: Family History  Problem Relation Age of Onset  . Heart failure Mother   . Diabetes Father   . Hypertension Father   . Lung cancer Sister   . Stroke Brother     SOCIAL HISTORY: Social History   Social History  . Marital status: Legally Separated    Spouse name: N/A  . Number of children: 3  . Years of education: N/A   Occupational History  . med tech    Social History Main Topics  . Smoking status: Never Smoker  . Smokeless tobacco: Never Used  . Alcohol use No  . Drug use: No  . Sexual activity: Not on file   Other Topics Concern  . Not on file   Social History Narrative   Boyfriend has  lung cancer.   Lives with grandson and youngest daughter.     PHYSICAL EXAM  Vitals:   03/06/17 0932  BP: 118/76  Pulse: 70  Weight: 242 lb 3.2 oz (109.9 kg)   Body mass index is 44.3 kg/m.  Generalized: Well developed, Obese female in no acute distress  Head: normocephalic and atraumatic,. Oropharynx benign  Neck: Supple, no carotid bruits  Cardiac: Regular rate rhythm, no murmur, not in atrial fib  Musculoskeletal: No deformity   Neurological examination   Mentation: Alert oriented to time, place, history taking. Attention span and concentration appropriate. Recent and remote memory intact.  Follows all commands speech and language fluent.   Cranial nerve II-XII: Pupils were equal round reactive to light extraocular movements were full, visual field were full on confrontational test. Facial sensation and strength were normal. hearing was intact to finger rubbing bilaterally. Uvula tongue midline. head turning and shoulder shrug were normal and  symmetric.Tongue protrusion into cheek strength was normal. Motor: normal bulk and tone, full strength in the BUE, BLE, fine finger movements normal, no pronator drift. No focal weakness Sensory: normal and symmetric to light touch, pinprick, and  Vibration, in the upper and lower extremities Coordination: finger-nose-finger, heel-to-shin bilaterally, no dysmetria, no tremor Reflexes: 1+ upper lower and symmetric, plantar responses were flexor bilaterally. Gait and Station: Rising up from seated position without assistance, normal stance,  moderate stride, stooped posture, ambulates with single-point cane, no difficulty with turns. DIAGNOSTIC DATA (LABS, IMAGING, TESTING) - I reviewed patient records, labs, notes, testing and imaging myself where available.  Lab Results  Component Value Date   WBC 8.1 11/18/2016   HGB 12.0 11/18/2016   HCT 37.4 11/18/2016   MCV 70.2 (L) 11/18/2016   PLT 391 11/18/2016      Component Value Date/Time   NA 139 11/18/2016 1927   NA 139 11/04/2016 0957   K 4.0 11/18/2016 1927   CL 103 11/18/2016 1927   CO2 23 11/18/2016 1927   GLUCOSE 257 (H) 11/18/2016 1927   BUN 12 11/18/2016 1927   BUN 14 11/04/2016 0957   CREATININE 1.06 (H) 11/18/2016 1927   CREATININE 1.03 (H) 06/11/2015 1056   CALCIUM 9.9 11/18/2016 1927   PROT 7.2 11/07/2016 1111   ALBUMIN 3.6 11/07/2016 1111   AST 18 11/07/2016 1111   ALT 13 (L) 11/07/2016 1111   ALKPHOS 89 11/07/2016 1111   BILITOT 0.6 11/07/2016 1111   GFRNONAA 55 (L) 11/18/2016 1927   GFRAA >60 11/18/2016 1927   Lab Results  Component Value Date   CHOL 145 02/17/2016   HDL 32 (L) 02/17/2016   LDLCALC 88 02/17/2016   LDLDIRECT 51 03/27/2016   TRIG 123 02/17/2016   CHOLHDL 4.5 02/17/2016   Lab Results  Component Value Date   HGBA1C 14.1 11/04/2016   No results found for: [id number] Lab Results  Component Value Date   TSH 1.862 02/17/2016      ASSESSMENT AND PLAN  61 y.o. African American female with  PMH of atrial fibrillation (patient stopped Xarelto secondary to cost), hypertension, hyperlipidemia, previous strokes, and diabetes mellitus as well as LBPadmitted on 02/16/16 for right external capsule acute infarct as well as remote lacunar infarcts at b/l thalami and right midbrain and left pons. MRA, CUS, TTE unremarkable, LDL 88 and A1C 9.3. She was restarted on Xarelto and put on lipitor 80mg . During the interval time, the patient has been doing better. Following up with endocrinologist for DM control which remains out of control.  On Xarelto, compliant with medication. The patient is a current patient of Dr. Erlinda [name] who is out of the office today . This note is sent to the work in doctor.      PLAN: Stressed the importance of management of risk factors to prevent further stroke Continue Xarelto and Lipitor for secondary stroke prevention Maintain strict control of hypertension with blood pressure goal below 130/90, today's reading118/76 continue antihypertensive medications Control of diabetes with hemoglobin A1c below 6.5 followed by primary care most recent hemoglobin A1c14.1 continue diabetic medications  Cholesterol with LDL cholesterol less than 70, followed by primary care,  continue statin drugs Continue Follow up with cardiology for atrial fib treatment Continue close monitoring of glucose and follow up for medication adjustments Exercise by walking, use cane for safe ambulation eat healthy diabetic diet with whole grains,  fresh fruits and vegetables Will discharge from stroke clinic I spent 25 minutes in total face to face time with the patient more than 50% of which was spent counseling and coordination of care, reviewing test results reviewing medications and discussing and reviewing the diagnosis of stroke importance of managing risk factors. Written information given [name], [facility], [facility], APRN  [facility] [address], [place], [place]  [address] [phone]

## 2017-03-06 ENCOUNTER — Encounter (INDEPENDENT_AMBULATORY_CARE_PROVIDER_SITE_OTHER): Payer: Self-pay

## 2017-03-06 ENCOUNTER — Encounter: Payer: Self-pay | Admitting: Nurse Practitioner

## 2017-03-06 ENCOUNTER — Ambulatory Visit (INDEPENDENT_AMBULATORY_CARE_PROVIDER_SITE_OTHER): Payer: Medicare Other | Admitting: Nurse Practitioner

## 2017-03-06 VITALS — BP 118/76 | HR 70 | Wt 242.2 lb

## 2017-03-06 DIAGNOSIS — R269 Unspecified abnormalities of gait and mobility: Secondary | ICD-10-CM

## 2017-03-06 DIAGNOSIS — I10 Essential (primary) hypertension: Secondary | ICD-10-CM

## 2017-03-06 DIAGNOSIS — E785 Hyperlipidemia, unspecified: Secondary | ICD-10-CM | POA: Diagnosis not present

## 2017-03-06 DIAGNOSIS — Z8673 Personal history of transient ischemic attack (TIA), and cerebral infarction without residual deficits: Secondary | ICD-10-CM | POA: Diagnosis not present

## 2017-03-06 DIAGNOSIS — I63411 Cerebral infarction due to embolism of right middle cerebral artery: Secondary | ICD-10-CM

## 2017-03-06 DIAGNOSIS — E1169 Type 2 diabetes mellitus with other specified complication: Secondary | ICD-10-CM | POA: Diagnosis not present

## 2017-03-06 DIAGNOSIS — I480 Paroxysmal atrial fibrillation: Secondary | ICD-10-CM

## 2017-03-06 DIAGNOSIS — E1165 Type 2 diabetes mellitus with hyperglycemia: Secondary | ICD-10-CM

## 2017-03-06 DIAGNOSIS — E1159 Type 2 diabetes mellitus with other circulatory complications: Secondary | ICD-10-CM

## 2017-03-06 NOTE — Patient Instructions (Addendum)
Stressed the importance of management of risk factors to prevent further stroke Continue Xarelto and Lipitor for secondary stroke prevention Maintain strict control of hypertension with blood pressure goal below 130/90, today's reading118/76 continue antihypertensive medications Control of diabetes with hemoglobin A1c below 6.5 followed by primary care most recent hemoglobin A1c14.1 continue diabetic medications Cholesterol with LDL cholesterol less than 70, followed by primary care,  continue statin drugs Continue Follow up with cardiology for atrial fib treatment Continue close monitoring of glucose and follow up for medication adjustments Exercise by walking, use cane for safe ambulation eat healthy diabetic diet with whole grains,  fresh fruits and vegetables Will discharge from stroke clinic   Stroke Prevention Some health problems and behaviors may make it more likely for you to have a stroke. Below are ways to lessen your risk of having a stroke.  Be active for at least 30 minutes on most or all days.  Do not smoke. Try not to be around others who smoke.  Do not drink too much alcohol. ? Do not have more than 2 drinks a day if you are a man. ? Do not have more than 1 drink a day if you are a woman and are not pregnant.  Eat healthy foods, such as fruits and vegetables. If you were put on a specific diet, follow the diet as told.  Keep your cholesterol levels under control through diet and medicines. Look for foods that are low in saturated fat, trans fat, cholesterol, and are high in fiber.  If you have diabetes, follow all diet plans and take your medicine as told.  Ask your doctor if you need treatment to lower your blood pressure. If you have high blood pressure (hypertension), follow all diet plans and take your medicine as told by your doctor.  If you are 92-59 years old, have your blood pressure checked every 3-5 years. If you are age 30 or older, have your blood pressure  checked every year.  Keep a healthy weight. Eat foods that are low in calories, salt, saturated fat, trans fat, and cholesterol.  Do not take drugs.  Avoid birth control pills, if this applies. Talk to your doctor about the risks of taking birth control pills.  Talk to your doctor if you have sleep problems (sleep apnea).  Take all medicine as told by your doctor. ? You may be told to take aspirin or blood thinner medicine. Take this medicine as told by your doctor. ? Understand your medicine instructions.  Make sure any other conditions you have are being taken care of.  Get help right away if:  You suddenly lose feeling (you feel numb) or have weakness in your face, arm, or leg.  Your face or eyelid hangs down to one side.  You suddenly feel confused.  You have trouble talking (aphasia) or understanding what people are saying.  You suddenly have trouble seeing in one or both eyes.  You suddenly have trouble walking.  You are dizzy.  You lose your balance or your movements are clumsy (uncoordinated).  You suddenly have a very bad headache and you do not know the cause.  You have new chest pain.  Your heart feels like it is fluttering or skipping a beat (irregular heartbeat). Do not wait to see if the symptoms above go away. Get help right away. Call your local emergency services (911 in [place] Do not drive yourself to the hospital. This information is not intended to replace advice given to  you by your health care provider. Make sure you discuss any questions you have with your health care provider. Document Released: 01/20/2012 Document Revised: 12/27/2015 Document Reviewed: 01/21/2013 Elsevier Interactive Patient Education  [employer].

## 2017-03-06 NOTE — Progress Notes (Signed)
I have read the note, and I agree with the clinical assessment and plan.  Raelee Rossmann KEITH   

## 2017-03-09 ENCOUNTER — Ambulatory Visit: Payer: Medicare Other | Admitting: Nurse Practitioner

## 2017-06-28 DIAGNOSIS — Z23 Encounter for immunization: Secondary | ICD-10-CM | POA: Diagnosis not present

## 2017-09-10 ENCOUNTER — Encounter: Payer: Medicare Other | Admitting: Internal Medicine

## 2017-09-15 ENCOUNTER — Inpatient Hospital Stay (HOSPITAL_COMMUNITY)
Admission: EM | Admit: 2017-09-15 | Discharge: 2017-09-19 | DRG: 041 | Disposition: A | Discharge status: Home / Self Care | Payer: Medicare HMO | Attending: Family Medicine | Admitting: Family Medicine

## 2017-09-15 ENCOUNTER — Emergency Department (HOSPITAL_COMMUNITY): Payer: Medicare HMO

## 2017-09-15 ENCOUNTER — Encounter (HOSPITAL_COMMUNITY): Payer: Self-pay | Admitting: Emergency Medicine

## 2017-09-15 DIAGNOSIS — I69351 Hemiplegia and hemiparesis following cerebral infarction affecting right dominant side: Secondary | ICD-10-CM

## 2017-09-15 DIAGNOSIS — M2669 Other specified disorders of temporomandibular joint: Secondary | ICD-10-CM | POA: Diagnosis present

## 2017-09-15 DIAGNOSIS — Z6841 Body Mass Index (BMI) 40.0 and over, adult: Secondary | ICD-10-CM | POA: Diagnosis not present

## 2017-09-15 DIAGNOSIS — E1122 Type 2 diabetes mellitus with diabetic chronic kidney disease: Secondary | ICD-10-CM | POA: Diagnosis present

## 2017-09-15 DIAGNOSIS — R519 Headache, unspecified: Secondary | ICD-10-CM

## 2017-09-15 DIAGNOSIS — R51 Headache: Secondary | ICD-10-CM | POA: Diagnosis not present

## 2017-09-15 DIAGNOSIS — Z794 Long term (current) use of insulin: Secondary | ICD-10-CM | POA: Diagnosis not present

## 2017-09-15 DIAGNOSIS — T380X5A Adverse effect of glucocorticoids and synthetic analogues, initial encounter: Secondary | ICD-10-CM | POA: Diagnosis present

## 2017-09-15 DIAGNOSIS — Z9989 Dependence on other enabling machines and devices: Secondary | ICD-10-CM | POA: Diagnosis not present

## 2017-09-15 DIAGNOSIS — Z888 Allergy status to other drugs, medicaments and biological substances status: Secondary | ICD-10-CM

## 2017-09-15 DIAGNOSIS — R200 Anesthesia of skin: Secondary | ICD-10-CM | POA: Diagnosis present

## 2017-09-15 DIAGNOSIS — D72829 Elevated white blood cell count, unspecified: Secondary | ICD-10-CM | POA: Diagnosis present

## 2017-09-15 DIAGNOSIS — D631 Anemia in chronic kidney disease: Secondary | ICD-10-CM | POA: Diagnosis present

## 2017-09-15 DIAGNOSIS — N182 Chronic kidney disease, stage 2 (mild): Secondary | ICD-10-CM | POA: Diagnosis present

## 2017-09-15 DIAGNOSIS — G4733 Obstructive sleep apnea (adult) (pediatric): Secondary | ICD-10-CM | POA: Diagnosis present

## 2017-09-15 DIAGNOSIS — H538 Other visual disturbances: Secondary | ICD-10-CM | POA: Diagnosis present

## 2017-09-15 DIAGNOSIS — E1159 Type 2 diabetes mellitus with other circulatory complications: Secondary | ICD-10-CM | POA: Diagnosis present

## 2017-09-15 DIAGNOSIS — Z79899 Other long term (current) drug therapy: Secondary | ICD-10-CM

## 2017-09-15 DIAGNOSIS — R29702 NIHSS score 2: Secondary | ICD-10-CM | POA: Diagnosis present

## 2017-09-15 DIAGNOSIS — Z886 Allergy status to analgesic agent status: Secondary | ICD-10-CM

## 2017-09-15 DIAGNOSIS — I639 Cerebral infarction, unspecified: Secondary | ICD-10-CM | POA: Diagnosis not present

## 2017-09-15 DIAGNOSIS — E1151 Type 2 diabetes mellitus with diabetic peripheral angiopathy without gangrene: Secondary | ICD-10-CM | POA: Diagnosis present

## 2017-09-15 DIAGNOSIS — I10 Essential (primary) hypertension: Secondary | ICD-10-CM | POA: Diagnosis not present

## 2017-09-15 DIAGNOSIS — Z791 Long term (current) use of non-steroidal anti-inflammatories (NSAID): Secondary | ICD-10-CM

## 2017-09-15 DIAGNOSIS — M316 Other giant cell arteritis: Secondary | ICD-10-CM | POA: Diagnosis not present

## 2017-09-15 DIAGNOSIS — R634 Abnormal weight loss: Secondary | ICD-10-CM | POA: Diagnosis present

## 2017-09-15 DIAGNOSIS — R700 Elevated erythrocyte sedimentation rate: Secondary | ICD-10-CM | POA: Diagnosis present

## 2017-09-15 DIAGNOSIS — G43909 Migraine, unspecified, not intractable, without status migrainosus: Secondary | ICD-10-CM | POA: Diagnosis present

## 2017-09-15 DIAGNOSIS — I129 Hypertensive chronic kidney disease with stage 1 through stage 4 chronic kidney disease, or unspecified chronic kidney disease: Secondary | ICD-10-CM | POA: Diagnosis present

## 2017-09-15 DIAGNOSIS — E1165 Type 2 diabetes mellitus with hyperglycemia: Secondary | ICD-10-CM | POA: Diagnosis present

## 2017-09-15 DIAGNOSIS — Z885 Allergy status to narcotic agent status: Secondary | ICD-10-CM

## 2017-09-15 DIAGNOSIS — E785 Hyperlipidemia, unspecified: Secondary | ICD-10-CM | POA: Diagnosis present

## 2017-09-15 DIAGNOSIS — I4891 Unspecified atrial fibrillation: Secondary | ICD-10-CM | POA: Diagnosis not present

## 2017-09-15 DIAGNOSIS — IMO0002 Reserved for concepts with insufficient information to code with codable children: Secondary | ICD-10-CM

## 2017-09-15 DIAGNOSIS — Z7901 Long term (current) use of anticoagulants: Secondary | ICD-10-CM

## 2017-09-15 DIAGNOSIS — I480 Paroxysmal atrial fibrillation: Secondary | ICD-10-CM | POA: Diagnosis present

## 2017-09-15 DIAGNOSIS — E118 Type 2 diabetes mellitus with unspecified complications: Secondary | ICD-10-CM

## 2017-09-15 DIAGNOSIS — E119 Type 2 diabetes mellitus without complications: Secondary | ICD-10-CM

## 2017-09-15 HISTORY — DX: Transient cerebral ischemic attack, unspecified: G45.9

## 2017-09-15 LAB — COMPREHENSIVE METABOLIC PANEL
ALT: 13 U/L — ABNORMAL LOW (ref 14–54)
AST: 17 U/L (ref 15–41)
Albumin: 3.5 g/dL (ref 3.5–5.0)
Alkaline Phosphatase: 82 U/L (ref 38–126)
Anion gap: 12 (ref 5–15)
BILIRUBIN TOTAL: 0.7 mg/dL (ref 0.3–1.2)
BUN: 18 mg/dL (ref 6–20)
CO2: 19 mmol/L — ABNORMAL LOW (ref 22–32)
Calcium: 9.4 mg/dL (ref 8.9–10.3)
Chloride: 105 mmol/L (ref 101–111)
Creatinine, Ser: 1.19 mg/dL — ABNORMAL HIGH (ref 0.44–1.00)
GFR calc Af Amer: 56 mL/min — ABNORMAL LOW (ref >60)
GFR, EST NON AFRICAN AMERICAN: 48 mL/min — AB (ref >60)
Glucose, Bld: 359 mg/dL — ABNORMAL HIGH (ref 65–99)
POTASSIUM: 4.2 mmol/L (ref 3.5–5.1)
Sodium: 136 mmol/L (ref 135–145)
Total Protein: 7.3 g/dL (ref 6.5–8.1)

## 2017-09-15 LAB — DIFFERENTIAL
Basophils Absolute: 0 10*3/uL (ref 0.0–0.1)
Basophils Relative: 0 %
EOS PCT: 1 %
Eosinophils Absolute: 0.1 10*3/uL (ref 0.0–0.7)
LYMPHS ABS: 4 10*3/uL (ref 0.7–4.0)
Lymphocytes Relative: 47 %
Monocytes Absolute: 0.4 10*3/uL (ref 0.1–1.0)
Monocytes Relative: 5 %
Neutro Abs: 4 10*3/uL (ref 1.7–7.7)
Neutrophils Relative %: 47 %

## 2017-09-15 LAB — CBC
HEMATOCRIT: 37.4 % (ref 36.0–46.0)
Hemoglobin: 11.8 g/dL — ABNORMAL LOW (ref 12.0–15.0)
MCH: 22.5 pg — ABNORMAL LOW (ref 26.0–34.0)
MCHC: 31.6 g/dL (ref 30.0–36.0)
MCV: 71.2 fL — ABNORMAL LOW (ref 78.0–100.0)
PLATELETS: 308 10*3/uL (ref 150–400)
RBC: 5.25 MIL/uL — ABNORMAL HIGH (ref 3.87–5.11)
RDW: 14.8 % (ref 11.5–15.5)
WBC: 8.5 10*3/uL (ref 4.0–10.5)

## 2017-09-15 LAB — I-STAT CHEM 8, ED
BUN: 25 mg/dL — ABNORMAL HIGH (ref 6–20)
Calcium, Ion: 1.2 mmol/L (ref 1.15–1.40)
Chloride: 103 mmol/L (ref 101–111)
Creatinine, Ser: 1 mg/dL (ref 0.44–1.00)
Glucose, Bld: 361 mg/dL — ABNORMAL HIGH (ref 65–99)
HEMATOCRIT: 39 % (ref 36.0–46.0)
HEMOGLOBIN: 13.3 g/dL (ref 12.0–15.0)
Potassium: 4.7 mmol/L (ref 3.5–5.1)
Sodium: 139 mmol/L (ref 135–145)
TCO2: 28 mmol/L (ref 22–32)

## 2017-09-15 LAB — CBG MONITORING, ED: Glucose-Capillary: 324 mg/dL — ABNORMAL HIGH (ref 65–99)

## 2017-09-15 LAB — SEDIMENTATION RATE: Sed Rate: 51 mm/hr — ABNORMAL HIGH (ref 0–22)

## 2017-09-15 LAB — PROTIME-INR
INR: 2.13
PROTHROMBIN TIME: 23.6 s — AB (ref 11.4–15.2)

## 2017-09-15 LAB — I-STAT TROPONIN, ED: Troponin i, poc: 0 ng/mL (ref 0.00–0.08)

## 2017-09-15 LAB — APTT: APTT: 42 s — AB (ref 24–36)

## 2017-09-15 MED ORDER — DIPHENHYDRAMINE HCL 50 MG/ML IJ SOLN
25.0000 mg | Freq: Once | INTRAMUSCULAR | Status: AC
  Administered 2017-09-15: 25 mg via INTRAVENOUS
  Filled 2017-09-15: qty 1

## 2017-09-15 MED ORDER — SODIUM CHLORIDE 0.9 % IV BOLUS (SEPSIS)
1000.0000 mL | Freq: Once | INTRAVENOUS | Status: AC
  Administered 2017-09-15: 1000 mL via INTRAVENOUS

## 2017-09-15 MED ORDER — LORAZEPAM 2 MG/ML IJ SOLN
1.0000 mg | Freq: Once | INTRAMUSCULAR | Status: AC
  Administered 2017-09-15: 1 mg via INTRAVENOUS
  Filled 2017-09-15: qty 1

## 2017-09-15 MED ORDER — METOCLOPRAMIDE HCL 5 MG/ML IJ SOLN
10.0000 mg | Freq: Once | INTRAMUSCULAR | Status: AC
  Administered 2017-09-15: 10 mg via INTRAVENOUS
  Filled 2017-09-15: qty 2

## 2017-09-15 NOTE — ED Provider Notes (Addendum)
[facility] EMERGENCY DEPARTMENT Provider Note   CSN: [id number] Arrival date & time: 09/15/17  1535     History   Chief Complaint Chief Complaint  Patient presents with  . Headache    HPI [name] is a 62 y.o. female.  The history is provided by the patient.  Headache   This is a new problem. The current episode started yesterday. The problem occurs constantly. The problem has been gradually worsening. The headache is associated with an unknown factor. The pain is located in the right unilateral and temporal region. The quality of the pain is described as sharp. The pain is at a severity of 8/10. The pain is severe. The pain radiates to the face. Associated symptoms include nausea. Pertinent negatives include no shortness of breath and no vomiting. Associated symptoms comments: Some blurry vision to the right eye that started about 1-2 days before the headache and some numbness to the right side of face.  No leg or arm sx.  Denies diplopia.  No eye tenderness, redness or drainage.  No fever, SOB, chest or abd pain.  Pt states sugar has been higher due to insurance no longer covering lantus so still taking novolog and victoza.  Pt states similar sx with prior stroke.Marland Kitchen She has tried nothing for the symptoms. The treatment provided no relief.    Past Medical History:  Diagnosis Date  . Atrial fibrillation ([place])   . 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 Diabetes mellitus    type 2  . Hypercholesteremia   . Hypertension   . Hypertension    Hypertensive urgency 05/2006  . TIA (transient ischemic attack) 2017    Patient Active Problem List   Diagnosis Date Noted  . History of stroke 03/06/2017  . BV (bacterial vaginosis)   . Stroke (cerebrum) ([place]) 02/16/2016  . Stroke ([place]) 02/16/2016  . Acute ischemic stroke ([place])   . Type 2 diabetes mellitus with circulatory disorder, with long-term current use of insulin ([place])     . Paroxysmal atrial fibrillation ([facility])   . Upper back pain 10/12/2015  . Abdominal pain 06/11/2015  . Leg cramps, sleep related 06/11/2015  . Pain of right scapula 03/14/2015  . Dental abscess 12/07/2014  . Chronic dental pain 10/13/2014  . Intractable migraine without aura and without status migrainosus 02/21/2014  . Dyspnea 01/24/2014  . Mild obstructive sleep apnea 11/18/2013  . Insomnia 11/18/2013  . Trapezius muscle strain 09/07/2013  . Other social stressor 07/15/2013  . Sinus congestion 07/12/2013  . Toenail fungus 12/30/2012  . Obese 10/22/2012  . Abnormality of gait and mobility 08/18/2011  . Edema 08/12/2011  . Constipation 05/28/2011  . History of CVA (cerebrovascular accident) 05/20/2011  . Chest pain 05/20/2011  . Hot flashes 04/28/2011  . Headache 11/18/2010  . Long term (current) use of anticoagulants 09/14/2010  . CARPAL TUNNEL SYNDROME, LEFT 02/19/2010  . Diabetes mellitus out of control ([place]) 06/12/2009  . Atrial fibrillation ([place]) 06/07/2009  . Dermatophytosis of foot 03/13/2009  . Depression 09/29/2008  . Hyperlipidemia associated with type 2 diabetes mellitus ([place]) 08/22/2008  . CHRONIC KIDNEY DISEASE STAGE II ([facility]) 08/01/2008  . STRESS INCONTINENCE 07/31/2008  . Essential hypertension 04/16/2007    Past Surgical History:  Procedure Laterality Date  . DILATION AND CURETTAGE OF UTERUS  1976  . MENISCUS REPAIR  03/09   right knee  . TONSILECTOMY, ADENOIDECTOMY, BILATERAL MYRINGOTOMY AND TUBES  age 77  .  TONSILLECTOMY      OB History    No data available       Home Medications    Prior to Admission medications   Medication Sig Start Date End Date Taking? Authorizing Provider  albuterol (PROVENTIL HFA;VENTOLIN HFA) 108 (90 Base) MCG/ACT inhaler Inhale 2 puffs into the lungs every 6 (six) hours as needed for wheezing or shortness of breath. 12/10/15   Fredia Sorrow, MD  amLODipine (NORVASC) 10 MG tablet Take 1 tablet (10 mg total) by mouth  daily. 09/08/16   Rosalin Hawking, MD  amoxicillin-clavulanate (AUGMENTIN) 875-125 MG tablet Take 1 tablet by mouth 2 (two) times daily. 12/09/16   Verner Mould, MD  atorvastatin (LIPITOR) 80 MG tablet Take 1 tablet (80 mg total) by mouth daily at 6 PM. 09/08/16   Rosalin Hawking, MD  calcipotriene (DOVONOX) 0.005 % cream  03/04/16   [provider]  cetirizine (ZYRTEC) 10 MG tablet Take 1 tablet (10 mg total) by mouth daily. Patient not taking: Reported on 03/06/2017 12/09/16   Verner Mould, MD  cloNIDine (CATAPRES) 0.2 MG tablet Take 1 tablet (0.2 mg total) by mouth 2 (two) times daily. 11/04/16   Verner Mould, MD  cyclobenzaprine (FLEXERIL) 5 MG tablet Take 1 tablet (5 mg total) by mouth 3 (three) times daily as needed for muscle spasms. 11/07/16   Drenda Freeze, MD  fluticasone (FLONASE) 50 MCG/ACT nasal spray Place 2 sprays into both nostrils daily. 11/04/16   Verner Mould, MD  glucose blood test strip Use as instructed 11/04/16   Verner Mould, MD  glucose blood test strip Use as instructed 11/04/16   Verner Mould, MD  insulin aspart (NOVOLOG) 100 UNIT/ML injection Inject 20 units under the skin with breakfast and 30 units under the skin with lunch 11/04/16   Verner Mould, MD  Insulin Glargine (LANTUS SOLOSTAR) 100 UNIT/ML Solostar Pen ADMINISTER 65 UNITS UNDER THE SKIN DAILY 11/04/16   Verner Mould, MD  liraglutide (VICTOZA) 18 MG/3ML SOPN Inject 0.3 mLs (1.8 mg total) into the skin daily. 11/04/16   Verner Mould, MD  lisinopril (PRINIVIL,ZESTRIL) 40 MG tablet TAKE 1 TABLET(40 MG) BY MOUTH DAILY 11/04/16   Verner Mould, MD  meloxicam (MOBIC) 7.5 MG tablet Take 1 tablet (7.5 mg total) by mouth daily. 11/19/16   Larene Pickett, PA-C  rivaroxaban (XARELTO) 20 MG TABS tablet Take 1 tablet (20 mg total) by mouth daily with supper. 11/04/16   Verner Mould, MD  spironolactone  (ALDACTONE) 50 MG tablet Take 1 tablet (50 mg total) by mouth daily. 11/04/16   Verner Mould, MD    Family History Family History  Problem Relation Age of Onset  . Heart failure Mother   . Diabetes Father   . Hypertension Father   . Lung cancer Sister   . Stroke Brother     Social History Social History   Tobacco Use  . Smoking status: Never Smoker  . Smokeless tobacco: Never Used  Substance Use Topics  . Alcohol use: No  . Drug use: No     Allergies   Propoxyphene n-acetaminophen; Glipizide; and Metformin and related   Review of Systems Review of Systems  Respiratory: Negative for shortness of breath.   Gastrointestinal: Positive for nausea. Negative for vomiting.  Neurological: Positive for headaches.  All other systems reviewed and are negative.    Physical Exam Updated Vital Signs BP (!) 148/83   Pulse 60  Resp 15   Wt 110.2 kg (243 lb)   SpO2 100%   BMI 44.45 kg/m   Physical Exam  Constitutional: She is oriented to person, place, and time. She appears well-developed and well-nourished. She does not appear ill. No distress.  HENT:  Head: Normocephalic and atraumatic.  Mouth/Throat: Oropharynx is clear and moist.  Eyes: Conjunctivae and EOM are normal. Pupils are equal, round, and reactive to light.  Neck: Normal range of motion. Neck supple.  Cardiovascular: Normal rate, regular rhythm and intact distal pulses.  No murmur heard. Pulmonary/Chest: Effort normal and breath sounds normal. No respiratory distress. She has no wheezes. She has no rales.  Abdominal: Soft. She exhibits no distension. There is no tenderness. There is no rebound and no guarding.  Musculoskeletal: Normal range of motion. She exhibits no edema or tenderness.  Neurological: She is alert and oriented to person, place, and time. She has normal strength. A sensory deficit is present. Coordination normal.  Mild decreased sensation in the right side of face but no obvious  droop.  EOMI.  Tenderness with palpation over the right temporal artery.  5/5 strength in bilateral upper and lower ext.  No pronator drift.  No aphasia.  Skin: Skin is warm and dry. No rash noted. No erythema.  Psychiatric: She has a normal mood and affect. Her behavior is normal.  Nursing note and vitals reviewed.    ED Treatments / Results  Labs (all labs ordered are listed, but only abnormal results are displayed) Labs Reviewed  PROTIME-INR - Abnormal; Notable for the following components:      Result Value   Prothrombin Time 23.6 (*)    All other components within normal limits  APTT - Abnormal; Notable for the following components:   aPTT 42 (*)    All other components within normal limits  CBC - Abnormal; Notable for the following components:   RBC 5.25 (*)    Hemoglobin 11.8 (*)    MCV 71.2 (*)    MCH 22.5 (*)    All other components within normal limits  COMPREHENSIVE METABOLIC PANEL - Abnormal; Notable for the following components:   CO2 19 (*)    Glucose, Bld 359 (*)    Creatinine, Ser 1.19 (*)    ALT 13 (*)    GFR calc non Af Amer 48 (*)    GFR calc Af Amer 56 (*)    All other components within normal limits  CBG MONITORING, ED - Abnormal; Notable for the following components:   Glucose-Capillary 324 (*)    All other components within normal limits  I-STAT CHEM 8, ED - Abnormal; Notable for the following components:   BUN 25 (*)    Glucose, Bld 361 (*)    All other components within normal limits  DIFFERENTIAL  SEDIMENTATION RATE  I-STAT TROPONIN, ED  CBG MONITORING, ED    EKG  EKG Interpretation  Date/Time:  Tuesday September 15 2017 15:39:46 EST Ventricular Rate:  72 PR Interval:  158 QRS Duration: 84 QT Interval:  368 QTC Calculation: 402 R Axis:   6 Text Interpretation:  Normal sinus rhythm Low voltage QRS Cannot rule out Anterior infarct , age undetermined Nonspecific T wave abnormality RESOLVED SINCE PREVIOUS Confirmed by Blanchie Dessert  [phone]) on 09/15/2017 9:10:09 PM       Radiology Ct Head Wo Contrast  Result Date: 09/15/2017 CLINICAL DATA:  Acute headache, blurry vision. EXAM: CT HEAD WITHOUT CONTRAST TECHNIQUE: Contiguous axial images were obtained from the base  of the skull through the vertex without intravenous contrast. COMPARISON:  CT scan of January 06, 2016. FINDINGS: Brain: Mild chronic ischemic white matter disease is noted. No mass effect or midline shift is noted. Ventricular size is within normal limits. There is no evidence of mass lesion, hemorrhage or acute infarction. Vascular: No hyperdense vessel or unexpected calcification. Skull: Normal. Negative for fracture or focal lesion. Sinuses/Orbits: Probable chronic right ethmoid and maxillary sinusitis is noted. Other: None. IMPRESSION: Mild chronic ischemic white matter disease. No acute intracranial abnormality seen. Probable chronic right ethmoid and maxillary sinusitis. Electronically Signed   By: [name], [name]   On: 09/15/2017 16:33    Procedures Procedures (including critical care time)  Medications Ordered in ED Medications  sodium chloride 0.9 % bolus 1,000 mL (not administered)  metoCLOPramide (REGLAN) injection 10 mg (not administered)  diphenhydrAMINE (BENADRYL) injection 25 mg (not administered)  LORazepam (ATIVAN) injection 1 mg (not administered)     Initial Impression / Assessment and Plan / ED Course  I have reviewed the triage vital signs and the nursing notes.  Pertinent labs & imaging results that were available during my care of the patient were reviewed by me and considered in my medical decision making (see chart for details).  Clinical Course as of Sep 18 802  Wed Sep 16, 2017  0059 Elevated Sed Rate: (!) 51 [HM]  0059 No acute CVA. I personally reviewed the images. MR Brain Wo Contrast [HM]  0134 Elevated Glucose: (!) 359 [HM]    Clinical Course User Index [HM] Muthersbaugh, Jarrett Soho, PA-C   Patient is a 62 year old  female with a history of hypertension, atrial fibrillation on Xarelto, multiple strokes and diabetes presenting today with a 2-day history of sharp pains in the right side of her head, blurry vision and numbness to her face.  She denies any arm symptoms, swallowing or speech deficits.  Patient does states she has memory issues however that is been worsening over months and is not acute today.  Patient states she noticed some blurry vision of her right eye 1-2 days before the headache started.  She states this feels similar to prior strokes that she has had.  She denies any chest pain or shortness of breath.  She has had no infectious symptoms.  She has no eye pain.  Low suspicion for glaucoma, cardiac etiology.  Given temporal artery tenderness concern for possible temporal arteritis and will do a sed rate.  Patient CT without acute findings and labs are unchanged except for hyperglycemia in the 300s.  This is most likely due to patient no longer being able to afford Lantus because it is no longer covered by insurance.  If there is no focal neurologic abnormalities on exam except for some right-sided facial numbness.  Given patient's history will do an MRI to ensure no acute stroke.  Patient given headache cocktail and sed rate is pending  Pt checked out to [employer] at 2200.   Final Clinical Impressions(s) / ED Diagnoses   Final diagnoses:  None    ED Discharge Orders    None       Blanchie Dessert, MD 09/15/17 2128    Blanchie Dessert, MD 09/18/17 [phone]

## 2017-09-15 NOTE — ED Notes (Signed)
No reply when called for vitals at 19:49.

## 2017-09-15 NOTE — ED Triage Notes (Addendum)
Pt c/o of HA with blurry vision starting last night. Pt states she is also having issues remembering things. LKW at 2000 09-14-17. A&Ox4 no weakness noted. Pt states " maybe this is just my sugar" CBG 324.

## 2017-09-16 ENCOUNTER — Encounter (HOSPITAL_COMMUNITY): Payer: Self-pay | Admitting: *Deleted

## 2017-09-16 DIAGNOSIS — G43909 Migraine, unspecified, not intractable, without status migrainosus: Secondary | ICD-10-CM | POA: Diagnosis present

## 2017-09-16 DIAGNOSIS — E1151 Type 2 diabetes mellitus with diabetic peripheral angiopathy without gangrene: Secondary | ICD-10-CM | POA: Diagnosis present

## 2017-09-16 DIAGNOSIS — R200 Anesthesia of skin: Secondary | ICD-10-CM

## 2017-09-16 DIAGNOSIS — E1165 Type 2 diabetes mellitus with hyperglycemia: Secondary | ICD-10-CM | POA: Diagnosis present

## 2017-09-16 DIAGNOSIS — G4733 Obstructive sleep apnea (adult) (pediatric): Secondary | ICD-10-CM | POA: Diagnosis present

## 2017-09-16 DIAGNOSIS — I129 Hypertensive chronic kidney disease with stage 1 through stage 4 chronic kidney disease, or unspecified chronic kidney disease: Secondary | ICD-10-CM | POA: Diagnosis present

## 2017-09-16 DIAGNOSIS — Z794 Long term (current) use of insulin: Secondary | ICD-10-CM | POA: Diagnosis not present

## 2017-09-16 DIAGNOSIS — I480 Paroxysmal atrial fibrillation: Secondary | ICD-10-CM | POA: Diagnosis present

## 2017-09-16 DIAGNOSIS — D72829 Elevated white blood cell count, unspecified: Secondary | ICD-10-CM | POA: Diagnosis present

## 2017-09-16 DIAGNOSIS — Z791 Long term (current) use of non-steroidal anti-inflammatories (NSAID): Secondary | ICD-10-CM | POA: Diagnosis not present

## 2017-09-16 DIAGNOSIS — M2669 Other specified disorders of temporomandibular joint: Secondary | ICD-10-CM | POA: Diagnosis present

## 2017-09-16 DIAGNOSIS — I10 Essential (primary) hypertension: Secondary | ICD-10-CM | POA: Diagnosis not present

## 2017-09-16 DIAGNOSIS — D631 Anemia in chronic kidney disease: Secondary | ICD-10-CM | POA: Diagnosis present

## 2017-09-16 DIAGNOSIS — R51 Headache: Secondary | ICD-10-CM | POA: Diagnosis not present

## 2017-09-16 DIAGNOSIS — I4891 Unspecified atrial fibrillation: Secondary | ICD-10-CM | POA: Diagnosis not present

## 2017-09-16 DIAGNOSIS — Z9989 Dependence on other enabling machines and devices: Secondary | ICD-10-CM | POA: Diagnosis not present

## 2017-09-16 DIAGNOSIS — E785 Hyperlipidemia, unspecified: Secondary | ICD-10-CM | POA: Diagnosis present

## 2017-09-16 DIAGNOSIS — N182 Chronic kidney disease, stage 2 (mild): Secondary | ICD-10-CM | POA: Diagnosis present

## 2017-09-16 DIAGNOSIS — H538 Other visual disturbances: Secondary | ICD-10-CM | POA: Diagnosis present

## 2017-09-16 DIAGNOSIS — T380X5A Adverse effect of glucocorticoids and synthetic analogues, initial encounter: Secondary | ICD-10-CM | POA: Diagnosis present

## 2017-09-16 DIAGNOSIS — I69351 Hemiplegia and hemiparesis following cerebral infarction affecting right dominant side: Secondary | ICD-10-CM | POA: Diagnosis not present

## 2017-09-16 DIAGNOSIS — R29702 NIHSS score 2: Secondary | ICD-10-CM | POA: Diagnosis present

## 2017-09-16 DIAGNOSIS — Z6841 Body Mass Index (BMI) 40.0 and over, adult: Secondary | ICD-10-CM | POA: Diagnosis not present

## 2017-09-16 DIAGNOSIS — R634 Abnormal weight loss: Secondary | ICD-10-CM | POA: Diagnosis present

## 2017-09-16 DIAGNOSIS — R700 Elevated erythrocyte sedimentation rate: Secondary | ICD-10-CM | POA: Diagnosis present

## 2017-09-16 DIAGNOSIS — E1122 Type 2 diabetes mellitus with diabetic chronic kidney disease: Secondary | ICD-10-CM | POA: Diagnosis present

## 2017-09-16 DIAGNOSIS — E1159 Type 2 diabetes mellitus with other circulatory complications: Secondary | ICD-10-CM | POA: Diagnosis present

## 2017-09-16 LAB — GLUCOSE, CAPILLARY
GLUCOSE-CAPILLARY: 318 mg/dL — AB (ref 65–99)
Glucose-Capillary: 280 mg/dL — ABNORMAL HIGH (ref 65–99)

## 2017-09-16 LAB — LIPID PANEL
CHOL/HDL RATIO: 3.3 ratio
CHOLESTEROL: 85 mg/dL (ref 0–200)
HDL: 26 mg/dL — AB (ref >40)
LDL Cholesterol: 43 mg/dL (ref 0–99)
Triglycerides: 82 mg/dL (ref <150)
VLDL: 16 mg/dL (ref 0–40)

## 2017-09-16 LAB — CBC
HCT: 34.9 % — ABNORMAL LOW (ref 36.0–46.0)
Hemoglobin: 11 g/dL — ABNORMAL LOW (ref 12.0–15.0)
MCH: 22.5 pg — ABNORMAL LOW (ref 26.0–34.0)
MCHC: 31.5 g/dL (ref 30.0–36.0)
MCV: 71.4 fL — ABNORMAL LOW (ref 78.0–100.0)
Platelets: 308 10*3/uL (ref 150–400)
RBC: 4.89 MIL/uL (ref 3.87–5.11)
RDW: 14.8 % (ref 11.5–15.5)
WBC: 8.4 10*3/uL (ref 4.0–10.5)

## 2017-09-16 LAB — CBG MONITORING, ED
GLUCOSE-CAPILLARY: 387 mg/dL — AB (ref 65–99)
GLUCOSE-CAPILLARY: 381 mg/dL — AB (ref 65–99)
Glucose-Capillary: 410 mg/dL — ABNORMAL HIGH (ref 65–99)
Glucose-Capillary: 367 mg/dL — ABNORMAL HIGH (ref 65–99)
Glucose-Capillary: 288 mg/dL — ABNORMAL HIGH (ref 65–99)

## 2017-09-16 LAB — HEMOGLOBIN A1C
Hgb A1c MFr Bld: 14.9 % — ABNORMAL HIGH (ref 4.8–5.6)
MEAN PLASMA GLUCOSE: 380.93 mg/dL

## 2017-09-16 LAB — BASIC METABOLIC PANEL
Anion gap: 10 (ref 5–15)
BUN: 16 mg/dL (ref 6–20)
CALCIUM: 9 mg/dL (ref 8.9–10.3)
CO2: 22 mmol/L (ref 22–32)
Chloride: 106 mmol/L (ref 101–111)
Creatinine, Ser: 1.11 mg/dL — ABNORMAL HIGH (ref 0.44–1.00)
GFR calc Af Amer: >60 mL/min (ref >60)
GFR, EST NON AFRICAN AMERICAN: 52 mL/min — AB (ref >60)
GLUCOSE: 332 mg/dL — AB (ref 65–99)
Potassium: 4.1 mmol/L (ref 3.5–5.1)
Sodium: 138 mmol/L (ref 135–145)

## 2017-09-16 LAB — PROTIME-INR
INR: 3.46
Prothrombin Time: 34.5 seconds — ABNORMAL HIGH (ref 11.4–15.2)

## 2017-09-16 LAB — C-REACTIVE PROTEIN: CRP: 0.9 mg/dL (ref <1.0)

## 2017-09-16 LAB — IRON AND TIBC
Iron: 38 ug/dL (ref 28–170)
SATURATION RATIOS: 12 % (ref 10.4–31.8)
TIBC: 305 ug/dL (ref 250–450)
UIBC: 267 ug/dL

## 2017-09-16 LAB — FERRITIN: FERRITIN: 266 ng/mL (ref 11–307)

## 2017-09-16 LAB — HIV ANTIBODY (ROUTINE TESTING W REFLEX): HIV Screen 4th Generation wRfx: NONREACTIVE

## 2017-09-16 MED ORDER — SODIUM CHLORIDE 0.9 % IV SOLN
1000.0000 mg | INTRAVENOUS | Status: AC
  Administered 2017-09-17 – 2017-09-18 (×2): 1000 mg via INTRAVENOUS
  Filled 2017-09-16 (×2): qty 8

## 2017-09-16 MED ORDER — INSULIN ASPART 100 UNIT/ML ~~LOC~~ SOLN
0.0000 [IU] | Freq: Every day | SUBCUTANEOUS | Status: DC
  Administered 2017-09-16 – 2017-09-18 (×3): 4 [IU] via SUBCUTANEOUS

## 2017-09-16 MED ORDER — HEPARIN (PORCINE) IN NACL 100-0.45 UNIT/ML-% IJ SOLN
1100.0000 [IU]/h | INTRAMUSCULAR | Status: DC
  Administered 2017-09-17: 1100 [IU]/h via INTRAVENOUS
  Administered 2017-09-17: 1200 [IU]/h via INTRAVENOUS
  Filled 2017-09-16 (×2): qty 250

## 2017-09-16 MED ORDER — INSULIN ASPART 100 UNIT/ML ~~LOC~~ SOLN
0.0000 [IU] | Freq: Three times a day (TID) | SUBCUTANEOUS | Status: DC
  Administered 2017-09-16 (×2): 20 [IU] via SUBCUTANEOUS
  Administered 2017-09-16: 11 [IU] via SUBCUTANEOUS
  Administered 2017-09-17 (×2): 20 [IU] via SUBCUTANEOUS
  Administered 2017-09-17: 15 [IU] via SUBCUTANEOUS
  Administered 2017-09-18 (×3): 11 [IU] via SUBCUTANEOUS
  Administered 2017-09-19: 15 [IU] via SUBCUTANEOUS
  Administered 2017-09-19: 11 [IU] via SUBCUTANEOUS
  Filled 2017-09-16 (×2): qty 1

## 2017-09-16 MED ORDER — CLONIDINE HCL 0.1 MG PO TABS
0.2000 mg | ORAL_TABLET | Freq: Two times a day (BID) | ORAL | Status: DC
  Administered 2017-09-17 – 2017-09-19 (×4): 0.2 mg via ORAL
  Filled 2017-09-16 (×4): qty 2

## 2017-09-16 MED ORDER — RIVAROXABAN 20 MG PO TABS
20.0000 mg | ORAL_TABLET | Freq: Every day | ORAL | Status: DC
  Administered 2017-09-16: 20 mg via ORAL
  Filled 2017-09-16: qty 1

## 2017-09-16 MED ORDER — SPIRONOLACTONE 25 MG PO TABS
50.0000 mg | ORAL_TABLET | Freq: Every day | ORAL | Status: DC
  Administered 2017-09-16 – 2017-09-19 (×3): 50 mg via ORAL
  Filled 2017-09-16 (×2): qty 2
  Filled 2017-09-16 (×3): qty 1

## 2017-09-16 MED ORDER — ONDANSETRON HCL 4 MG/2ML IJ SOLN: 4.0000 mg | Freq: Four times a day (QID) | INTRAMUSCULAR | Status: DC | PRN

## 2017-09-16 MED ORDER — ONDANSETRON HCL 4 MG PO TABS: 4.0000 mg | ORAL_TABLET | Freq: Four times a day (QID) | ORAL | Status: DC | PRN

## 2017-09-16 MED ORDER — LISINOPRIL 20 MG PO TABS
40.0000 mg | ORAL_TABLET | Freq: Every day | ORAL | Status: DC
  Administered 2017-09-16 – 2017-09-19 (×3): 40 mg via ORAL
  Filled 2017-09-16 (×3): qty 2

## 2017-09-16 MED ORDER — ATORVASTATIN CALCIUM 80 MG PO TABS
80.0000 mg | ORAL_TABLET | Freq: Every day | ORAL | Status: DC
  Administered 2017-09-16 – 2017-09-18 (×3): 80 mg via ORAL
  Filled 2017-09-16 (×4): qty 1

## 2017-09-16 MED ORDER — AMLODIPINE BESYLATE 10 MG PO TABS
10.0000 mg | ORAL_TABLET | Freq: Every day | ORAL | Status: DC
  Administered 2017-09-16 – 2017-09-19 (×3): 10 mg via ORAL
  Filled 2017-09-16: qty 2
  Filled 2017-09-16 (×2): qty 1

## 2017-09-16 MED ORDER — INSULIN ASPART 100 UNIT/ML ~~LOC~~ SOLN
20.0000 [IU] | Freq: Once | SUBCUTANEOUS | Status: AC
  Administered 2017-09-16: 20 [IU] via SUBCUTANEOUS

## 2017-09-16 MED ORDER — SODIUM CHLORIDE 0.9 % IV SOLN
1000.0000 mg | Freq: Once | INTRAVENOUS | Status: AC
  Administered 2017-09-16: 1000 mg via INTRAVENOUS
  Filled 2017-09-16: qty 8

## 2017-09-16 MED ORDER — INSULIN GLARGINE 100 UNIT/ML ~~LOC~~ SOLN
30.0000 [IU] | Freq: Every day | SUBCUTANEOUS | Status: DC
  Administered 2017-09-16: 30 [IU] via SUBCUTANEOUS
  Filled 2017-09-16: qty 0.3

## 2017-09-16 MED ORDER — INSULIN GLARGINE 100 UNIT/ML ~~LOC~~ SOLN
20.0000 [IU] | Freq: Every day | SUBCUTANEOUS | Status: DC
  Filled 2017-09-16: qty 0.2

## 2017-09-16 MED ORDER — POLYETHYLENE GLYCOL 3350 17 G PO PACK
17.0000 g | PACK | Freq: Every day | ORAL | Status: DC | PRN
Start: 2017-09-16 — End: 2017-09-19

## 2017-09-16 MED ORDER — INSULIN GLARGINE 100 UNIT/ML ~~LOC~~ SOLN
10.0000 [IU] | Freq: Every day | SUBCUTANEOUS | Status: DC
  Filled 2017-09-16: qty 0.1

## 2017-09-16 MED ORDER — ACETAMINOPHEN 325 MG PO TABS
650.0000 mg | ORAL_TABLET | Freq: Four times a day (QID) | ORAL | Status: DC | PRN
  Administered 2017-09-16 – 2017-09-18 (×3): 650 mg via ORAL
  Filled 2017-09-16 (×4): qty 2

## 2017-09-16 NOTE — ED Notes (Signed)
Checked patient blood sugar was 367 notified RN Bonnie of blood sugar

## 2017-09-16 NOTE — ED Notes (Signed)
MD at the bedside  

## 2017-09-16 NOTE — Progress Notes (Signed)
ANTICOAGULATION CONSULT NOTE - Initial Consult  Pharmacy Consult for Heparin Indication: atrial fibrillation  Allergies  Allergen Reactions  . Propoxyphene N-Acetaminophen Hives, Itching, Swelling and Other (See Comments)    REACTION: Hallucinations  . Glipizide Nausea And Vomiting  . Metformin And Related Diarrhea    Patient Measurements: Weight: 243 lb (110.2 kg)  Ideal Body Weight: 50 kg Heparin Dosing Weight: 76 kg  Vital Signs: Temp: 98.1 F (36.7 C) (02/13 0718) Temp Source: Oral (02/13 0718) BP: 152/81 (02/13 1005) Pulse Rate: 86 (02/13 1006)  Labs: Recent Labs    09/15/17 1555 09/15/17 1615 09/16/17 0302 09/16/17 0748  HGB 11.8* 13.3 11.0*  --   HCT 37.4 39.0 34.9*  --   PLT 308  --  308  --   APTT 42*  --   --   --   LABPROT 23.6*  --   --  34.5*  INR 2.13  --   --  3.46  CREATININE 1.19* 1.00 1.11*  --     CrCl cannot be calculated (Unknown ideal weight.).   Medical History: Past Medical History:  Diagnosis Date  . Atrial fibrillation ([place])   . 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 Diabetes mellitus    type 2  . Hypercholesteremia   . Hypertension   . Hypertension    Hypertensive urgency 05/2006  . TIA (transient ischemic attack) 2017    Medications:   (Not in a hospital admission)  Assessment: 71 YOF who presented to the ED with headache and blurred vision. On rivaroxaban at home for h/o Afib. Pharmacy consulted to start IV heparin until INR down to 1.7 for surgery. Last dose of rivaroxaban was this AM at 0322. H/H low. Plt wnl. BL aPTT was 42 on 2/12  Goal of Therapy:  Heparin level 0.3-0.7 units/ml  APTT 66-102 s Monitor platelets by anticoagulation protocol: Yes   Plan:  -Start IV heparin at 1200 units/hr at 0330 tomorrow  -F/u 6 hr HL and aPTT -Monitor daily HL, aPTT, CBC and s/s of bleeding  Albertina Parr, PharmD., BCPS Clinical Pharmacist Pager [phone]

## 2017-09-16 NOTE — Progress Notes (Signed)
CALL PAGER [phone] for any questions or notifications regarding this patient  FMTS Attending Note: Dorcas Mcmurray MD Interim progress note.  I admitted this patient last night for the inpatient team, attending Dr. Mingo Amber.  I had recommended ophthalmological consult.  When the resident spoke to ophthalmology, the ophthalmologist was not certain that the patient needed to be seen inpatient so I called Dr. Posey Pronto and discussed with him on the phone.  I explained to him my concern that there is something we are missing here in this patient's presentation.  I really do not think this is temporal arteritis but have no way to prove that other than getting a biopsy so I think it is fine to continue down that path.  I asked Dr. Posey Pronto if there was some other issue that we would be missing such is venous occlusion etc.  He indicated that it is unlikely given her intact visual acuity however he kindly agreed to come by and see her this evening in the hospital.  We appreciate his care and evaluation.

## 2017-09-16 NOTE — Progress Notes (Addendum)
Per ophthalmology request, the following information was obtained. Visual Acuity  Left eyes 20/40 Right eye 20/50  Endorses having jaw pain after vigorous chewing consistent with jaw claudication.  Endorses roughly 40 pound unintentional weight loss over the past 3 months.  Endorses is increasing fatigue over the past several months.  CRP was 0.9, within normal limits.  We will follow-up with ophthalmology regarding this information.  Appreciate their assistance in patient's care.

## 2017-09-16 NOTE — Progress Notes (Addendum)
Spoke with Dr. Posey Pronto with ophthalmology, who recommended further workup including CRP, asking patient about jaw claudication, decreased appetite, visual acuity.  Opthalmothology said they would see her once these were obtained. Will obtain and follow-up with ophthalmology.  Appreciate ophthalmology's recommendations and care.

## 2017-09-16 NOTE — ED Provider Notes (Signed)
Care assumed from Blanchie Dessert, MD  Please see her full H&P.  In short,  [name] is a 62 y.o. female presents for right-sided temporal headache, blurred vision of the right eye and right-sided facial numbness.  Patient has a history of A. fib and CVA.  She is anticoagulated on Xarelto.  Patient reports that the symptoms are similar to previous CVAs.  She also reports her blood sugar has been high at home because she is only taking NovoLog and Victoza.  Physical Exam  BP 127/72   Pulse 65   Resp 17   Wt 110.2 kg (243 lb)   SpO2 98%   BMI 44.45 kg/m   Physical Exam  Constitutional: She appears well-developed and well-nourished. No distress.  HENT:  Head: Normocephalic.  Tenderness to palpation over the temporal area.  Eyes: Conjunctivae are normal. No scleral icterus.  Neck: Normal range of motion.  Cardiovascular: Normal rate and intact distal pulses.  Pulmonary/Chest: Effort normal.  Musculoskeletal: Normal range of motion.  Neurological: She is alert.  Skin: Skin is warm and dry.  No rash -specifically, no rash or vesicles over the temporal area of the right side of the face.  Nursing note and vitals reviewed.   ED Course/Procedures   Clinical Course as of Sep 16 134  Wed Sep 16, 2017  0059 Elevated Sed Rate: (!) 51 [HM]  0059 No acute CVA. I personally reviewed the images. MR Brain Wo Contrast [HM]  0134 Elevated Glucose: (!) 359 [HM]    Clinical Course User Index [HM] Si Jachim, Jarrett Soho, PA-C    Procedures  MDM   Patient presents with right-sided headache and vision changes.  Her MRI is negative for acute CVA.  Microhemorrhages are noted.  Concern for possible temporal arteritis.  Patient has been given Solu-Medrol 1000 mg.  She will need admission for high-dose steroids, possible biopsy and blood sugar control.  Temporal headache  Blurred vision, right eye  Right facial numbness  Elevated sed rate (elev SR)      Mekenna Finau, Gwenlyn Perking 09/16/17 0136    Tegeler, Gwenyth Allegra, MD 09/16/17 [phone]

## 2017-09-16 NOTE — Progress Notes (Signed)
Inpatient Diabetes Program Recommendations  AACE/ADA: New Consensus Statement on Inpatient Glycemic Control (2015)  Target Ranges:  Prepandial:   less than 140 mg/dL      Peak postprandial:   less than 180 mg/dL (1-2 hours)      Critically ill patients:  140 - 180 mg/dL   Lab Results  Component Value Date   GLUCAP 367 (H) 09/16/2017   HGBA1C 14.9 (H) 09/16/2017   Spoke with patient about diabetes and home regimen for diabetes control. Patient reports that she is followed by her PCP for diabetes management and was taken off her basal insulin due to insurance changing preferred brands of insulin and was never placed back on.  Patient reports that they taking insulin as prescribed but also says she will need refills on all of her DM medications in addition to a new blood glucose meter.  Patient reports her glucose being in the 300 range currently. Discussed A1C results (14.9% on 09/16/2017). Discussed glucose and A1C goals. Discussed importance of checking CBGs and maintaining good CBG control to prevent long-term and short-term complications. Explained how hyperglycemia leads to damage within blood vessels which lead to the common complications seen with uncontrolled diabetes. Discussed impact of nutrition, exercise, stress, sickness, and medications on diabetes control. Discussed carbohydrates, carbohydrate goals per day and meal, along with portion sizes.  Patient verbalized understanding of information discussed and has no further questions at this time related to diabetes.  Patient will need basal insulin that is tier 1 per her insurance at time of d/c.  Thanks,  Tama Headings RN, MSN, [facility] Inpatient Diabetes Coordinator Team Pager [phone] (8a-5p)

## 2017-09-16 NOTE — ED Notes (Signed)
Pt ambulated to bathroom and back to bed with minimal assistance and use of cane.

## 2017-09-16 NOTE — Progress Notes (Signed)
Patient arrived from ED to room. Safety precautions and orders reviewed with patient. VSS,   Patient verbalized that tylenol will not help her HD and decline med at this time and requests for other regimen. MD notified.  Will continue to monitor.    Ave Filter, RN

## 2017-09-16 NOTE — ED Notes (Signed)
Notified MD of CBG 410 which is greater than 400. Instructed to administer maximum dose, and recheck in one hour.

## 2017-09-16 NOTE — Progress Notes (Signed)
Inpatient Diabetes Program Recommendations  AACE/ADA: New Consensus Statement on Inpatient Glycemic Control (2015)  Target Ranges:  Prepandial:   less than 140 mg/dL      Peak postprandial:   less than 180 mg/dL (1-2 hours)      Critically ill patients:  140 - 180 mg/dL   Review of Glycemic Control  Diabetes history: DM 2 Outpatient Diabetes medications: Novolog 33 units tid, Victoza 0.3 mg Daily Current orders for Inpatient glycemic control: Novolog Resistant Correction 0-20 units tid + Novolog HS scale 0-5 units  Inpatient Diabetes Program Recommendations:    Glucose elevated in the 300's. Glucose 300's on admission before steroid administration. Consider starting Lantus 20 units (0.2 units/kg).  A1c 14.9% this admission. Will need basal insulin at time of discharge. However based on insurance may need to convert patient to 70/30.  Will see patient today.   Thanks,  Tama Headings RN, MSN, [facility] Inpatient Diabetes Coordinator Team Pager [phone] (8a-5p)

## 2017-09-16 NOTE — ED Notes (Signed)
Will give insulin when pt. Meal tray arrives. Pt refused first tray, called service response and placed order for patient preference.

## 2017-09-16 NOTE — ED Notes (Signed)
Patient got up walked to the bathroom

## 2017-09-16 NOTE — ED Notes (Signed)
Received verbal order to administer 20 units d/t CBG 367 from Dr. Shawna Orleans.

## 2017-09-16 NOTE — Consult Note (Signed)
[facility] Surgery Consult Note  [place] 02/28/1956  [id number].    Requesting MD: Mayo Chief Complaint/Reason for Consult: Concern for R temporal arteritis HPI:  Patient is a 62 y/o female with multiple chronic medical problems who presented to [facility] yesterday with R sided headache and R blurred vision since Monday evening. Patient describes headache as feeling like something is stuck that improves with a heartbeat and feels like a clot passes. Currently she does not have any headache but does still have some R blurred vision. Not having any eye discharge, tinnitus. Patient has been experiencing headaches like this for sometime. Hx of past CVA with residual right sided weakness. Denies fever/chills, SOB, chest pain, abdominal pain, neck/back pain, urinary symptoms, numbness or tingling. Patient is on xarelto for AFib. Allergic to darvacet. Denies alcohol, illicit drug, or tobacco use.   ROS: Review of Systems  Constitutional: Negative for chills and fever.  HENT: Negative for ear discharge, ear pain, hearing loss and sinus pain.   Eyes: Positive for blurred vision (R sided). Negative for photophobia, discharge and redness.  Respiratory: Negative for shortness of breath.   Cardiovascular: Positive for palpitations. Negative for chest pain.  Gastrointestinal: Positive for nausea. Negative for abdominal pain, constipation, diarrhea and vomiting.  Genitourinary: Negative for dysuria, frequency and urgency.  Musculoskeletal: Negative for back pain and neck pain.  Neurological: Positive for focal weakness (chronic R sided weakness) and headaches. Negative for tingling.    Family History  Problem Relation Age of Onset  . Heart failure Mother   . Diabetes Father   . Hypertension Father   . Lung cancer Sister   . Stroke Brother     Past Medical History:  Diagnosis Date  . Atrial fibrillation ([place])   . CVA (cerebral vascular accident) ([place])    left pontine and frontal lobe  .  Diabetes mellitus    type 2  . Hypercholesteremia   . Hypertension   . Hypertension    Hypertensive urgency 05/2006  . TIA (transient ischemic attack) 2017    Past Surgical History:  Procedure Laterality Date  . DILATION AND CURETTAGE OF UTERUS  1976  . MENISCUS REPAIR  03/09   right knee  . TONSILECTOMY, ADENOIDECTOMY, BILATERAL MYRINGOTOMY AND TUBES  age 49  . TONSILLECTOMY      Social History:  reports that  has never smoked. she has never used smokeless tobacco. She reports that she does not drink alcohol or use drugs.  Allergies:  Allergies  Allergen Reactions  . Propoxyphene N-Acetaminophen Hives, Itching, Swelling and Other (See Comments)    REACTION: Hallucinations  . Glipizide Nausea And Vomiting  . Metformin And Related Diarrhea     (Not in a hospital admission)  Blood pressure (!) 152/81, pulse 86, temperature 98.1 F (36.7 C), temperature source Oral, resp. rate 16, weight 110.2 kg (243 lb), SpO2 97 %. Physical Exam: Physical Exam  Constitutional: She is oriented to person, place, and time. She appears well-developed and well-nourished. She is cooperative.  Non-toxic appearance. No distress.  HENT:  Head: Normocephalic and atraumatic. Head is without right periorbital erythema and without left periorbital erythema.  Right Ear: Tympanic membrane, external ear and ear canal normal.  Left Ear: Tympanic membrane, external ear and ear canal normal.  Nose: Nose normal.  Mouth/Throat: Oropharynx is clear and moist and mucous membranes are normal. Abnormal dentition.  Temporal pulses 2+ bilaterally. Patient mildly TTP over right temple  Eyes: Conjunctivae, EOM and lids are normal. Pupils are  equal, round, and reactive to light. No scleral icterus.  Red reflexes present bilaterally   Cardiovascular: Normal rate, regular rhythm, S1 normal and S2 normal.  Pulses:      Radial pulses are 2+ on the right side, and 2+ on the left side.       Dorsalis pedis pulses are 2+  on the right side, and 2+ on the left side.  Pulmonary/Chest: Effort normal and breath sounds normal.  Abdominal: Soft. She exhibits no distension. There is no tenderness.  Obese  Musculoskeletal:  ROM grossly intact in bilateral upper extremities  Neurological: She is alert and oriented to person, place, and time. No cranial nerve deficit.  Skin: Skin is warm, dry and intact. No rash noted.  Psychiatric: She has a normal mood and affect. Her speech is normal and behavior is normal.    Results for orders placed or performed during the hospital encounter of 09/15/17 (from the past 48 hour(s))  CBG monitoring, ED     Status: Abnormal   Collection Time: 09/15/17  3:46 PM  Result Value Ref Range   Glucose-Capillary 324 (H) 65 - 99 mg/dL   Comment 1 Notify RN    Comment 2 Document in Chart   Protime-INR     Status: Abnormal   Collection Time: 09/15/17  3:55 PM  Result Value Ref Range   Prothrombin Time 23.6 (H) 11.4 - 15.2 seconds   INR 2.13     Comment: Performed at [facility], [place] [address] [place], [place] [address]  APTT     Status: Abnormal   Collection Time: 09/15/17  3:55 PM  Result Value Ref Range   aPTT 42 (H) 24 - 36 seconds    Comment:        IF BASELINE aPTT IS ELEVATED, SUGGEST PATIENT RISK ASSESSMENT BE USED TO DETERMINE APPROPRIATE ANTICOAGULANT THERAPY. Performed at [facility], [place] [address] [place], [place] [address]   CBC     Status: Abnormal   Collection Time: 09/15/17  3:55 PM  Result Value Ref Range   WBC 8.5 4.0 - 10.5 K/uL   RBC 5.25 (H) 3.87 - 5.11 MIL/uL   Hemoglobin 11.8 (L) 12.0 - 15.0 g/dL   HCT 37.4 36.0 - 46.0 %   MCV 71.2 (L) 78.0 - 100.0 fL   MCH 22.5 (L) 26.0 - 34.0 pg   MCHC 31.6 30.0 - 36.0 g/dL   RDW 14.8 11.5 - 15.5 %   Platelets 308 150 - 400 K/uL    Comment: Performed at [facility] [address] [place], [place] [address]  Differential     Status: None   Collection Time: 09/15/17  3:55 PM  Result Value  Ref Range   Neutrophils Relative % 47 %   Lymphocytes Relative 47 %   Monocytes Relative 5 %   Eosinophils Relative 1 %   Basophils Relative 0 %   Neutro Abs 4.0 1.7 - 7.7 K/uL   Lymphs Abs 4.0 0.7 - 4.0 K/uL   Monocytes Absolute 0.4 0.1 - 1.0 K/uL   Eosinophils Absolute 0.1 0.0 - 0.7 K/uL   Basophils Absolute 0.0 0.0 - 0.1 K/uL   RBC Morphology ELLIPTOCYTES     Comment: Performed at [facility] [address], [place], [place] [address]  Comprehensive metabolic panel     Status: Abnormal   Collection Time: 09/15/17  3:55 PM  Result Value Ref Range   Sodium 136 135 - 145  mmol/L   Potassium 4.2 3.5 - 5.1 mmol/L   Chloride 105 101 - 111 mmol/L   CO2 19 (L) 22 - 32 mmol/L   Glucose, Bld 359 (H) 65 - 99 mg/dL   BUN 18 6 - 20 mg/dL   Creatinine, Ser 1.19 (H) 0.44 - 1.00 mg/dL   Calcium 9.4 8.9 - 10.3 mg/dL   Total Protein 7.3 6.5 - 8.1 g/dL   Albumin 3.5 3.5 - 5.0 g/dL   AST 17 15 - 41 U/L   ALT 13 (L) 14 - 54 U/L   Alkaline Phosphatase 82 38 - 126 U/L   Total Bilirubin 0.7 0.3 - 1.2 mg/dL   GFR calc non Af Amer 48 (L) >60 mL/min   GFR calc Af Amer 56 (L) >60 mL/min    Comment: (NOTE) The eGFR has been calculated using the CKD EPI equation. This calculation has not been validated in all clinical situations. eGFR's persistently <60 mL/min signify possible Chronic Kidney Disease.    Anion gap 12 5 - 15    Comment: Performed at [facility] [address] [place], [place] [address]  I-stat troponin, ED     Status: None   Collection Time: 09/15/17  4:13 PM  Result Value Ref Range   Troponin i, poc 0.00 0.00 - 0.08 ng/mL   Comment 3            Comment: Due to the release kinetics of cTnI, a negative result within the first hours of the onset of symptoms does not rule out myocardial infarction with certainty. If myocardial infarction is still suspected, repeat the test at appropriate intervals.   I-Stat Chem 8, ED     Status: Abnormal   Collection Time:  09/15/17  4:15 PM  Result Value Ref Range   Sodium 139 135 - 145 mmol/L   Potassium 4.7 3.5 - 5.1 mmol/L   Chloride 103 101 - 111 mmol/L   BUN 25 (H) 6 - 20 mg/dL   Creatinine, Ser 1.00 0.44 - 1.00 mg/dL   Glucose, Bld 361 (H) 65 - 99 mg/dL   Calcium, Ion 1.20 1.15 - 1.40 mmol/L   TCO2 28 22 - 32 mmol/L   Hemoglobin 13.3 12.0 - 15.0 g/dL   HCT 39.0 36.0 - 46.0 %  Sedimentation rate     Status: Abnormal   Collection Time: 09/15/17  9:38 PM  Result Value Ref Range   Sed Rate 51 (H) 0 - 22 mm/hr    Comment: Performed at [facility] [address], [place], [place] [address]  Basic metabolic panel     Status: Abnormal   Collection Time: 09/16/17  3:02 AM  Result Value Ref Range   Sodium 138 135 - 145 mmol/L   Potassium 4.1 3.5 - 5.1 mmol/L   Chloride 106 101 - 111 mmol/L   CO2 22 22 - 32 mmol/L   Glucose, Bld 332 (H) 65 - 99 mg/dL   BUN 16 6 - 20 mg/dL   Creatinine, Ser 1.11 (H) 0.44 - 1.00 mg/dL   Calcium 9.0 8.9 - 10.3 mg/dL   GFR calc non Af Amer 52 (L) >60 mL/min   GFR calc Af Amer >60 >60 mL/min    Comment: (NOTE) The eGFR has been calculated using the CKD EPI equation. This calculation has not been validated in all clinical situations. eGFR's persistently <60 mL/min signify possible Chronic Kidney Disease.    Anion gap 10 5 - 15  Comment: Performed at [facility], [place] [address] [place], [place] [address]  CBC     Status: Abnormal   Collection Time: 09/16/17  3:02 AM  Result Value Ref Range   WBC 8.4 4.0 - 10.5 K/uL   RBC 4.89 3.87 - 5.11 MIL/uL   Hemoglobin 11.0 (L) 12.0 - 15.0 g/dL   HCT 34.9 (L) 36.0 - 46.0 %   MCV 71.4 (L) 78.0 - 100.0 fL   MCH 22.5 (L) 26.0 - 34.0 pg   MCHC 31.5 30.0 - 36.0 g/dL   RDW 14.8 11.5 - 15.5 %   Platelets 308 150 - 400 K/uL    Comment: Performed at [facility], [place] [address], [place], [place] [address]  Hemoglobin A1c     Status: Abnormal   Collection Time: 09/16/17  3:02 AM  Result Value Ref Range   Hgb  A1c MFr Bld 14.9 (H) 4.8 - 5.6 %    Comment: (NOTE) Pre diabetes:          5.7%-6.4% Diabetes:              >6.4% Glycemic control for   <7.0% adults with diabetes    Mean Plasma Glucose 380.93 mg/dL    Comment: Performed at [facility] [address] [place], [place] [address]  Lipid panel     Status: Abnormal   Collection Time: 09/16/17  3:02 AM  Result Value Ref Range   Cholesterol 85 0 - 200 mg/dL   Triglycerides 82 <150 mg/dL   HDL 26 (L) >40 mg/dL   Total CHOL/HDL Ratio 3.3 RATIO   VLDL 16 0 - 40 mg/dL   LDL Cholesterol 43 0 - 99 mg/dL    Comment:        Total Cholesterol/HDL:CHD Risk Coronary Heart Disease Risk Table                     Men   Women  1/2 Average Risk   3.4   3.3  Average Risk       5.0   4.4  2 X Average Risk   9.6   7.1  3 X Average Risk  23.4   11.0        Use the calculated Patient Ratio above and the CHD Risk Table to determine the patient's CHD Risk.        ATP III CLASSIFICATION (LDL):  <100     mg/dL   Optimal  100-129  mg/dL   Near or Above                    Optimal  130-159  mg/dL   Borderline  160-189  mg/dL   High  >190     mg/dL   Very High Performed at [facility] [address] [place], [place] [address]   Protime-INR     Status: Abnormal   Collection Time: 09/16/17  7:48 AM  Result Value Ref Range   Prothrombin Time 34.5 (H) 11.4 - 15.2 seconds   INR 3.46     Comment: Performed at [facility] [address], [place],  [address]  CBG monitoring, ED     Status: Abnormal   Collection Time: 09/16/17  7:53 AM  Result Value Ref Range   Glucose-Capillary 387 (H) 65 - 99 mg/dL  CBG monitoring, ED     Status: Abnormal   Collection Time: 09/16/17  9:49 AM  Result Value Ref Range   Glucose-Capillary 381 (H) 65 - 99 mg/dL   Ct Head Wo Contrast  Result Date: 09/15/2017 CLINICAL DATA:  Acute headache, blurry vision. EXAM: CT HEAD WITHOUT CONTRAST TECHNIQUE: Contiguous axial images were obtained from the  base of the skull through the vertex without intravenous contrast. COMPARISON:  CT scan of January 06, 2016. FINDINGS: Brain: Mild chronic ischemic white matter disease is noted. No mass effect or midline shift is noted. Ventricular size is within normal limits. There is no evidence of mass lesion, hemorrhage or acute infarction. Vascular: No hyperdense vessel or unexpected calcification. Skull: Normal. Negative for fracture or focal lesion. Sinuses/Orbits: Probable chronic right ethmoid and maxillary sinusitis is noted. Other: None. IMPRESSION: Mild chronic ischemic white matter disease. No acute intracranial abnormality seen. Probable chronic right ethmoid and maxillary sinusitis. Electronically Signed   By: [name], [name]   On: 09/15/2017 16:33   Mr Brain Wo Contrast  Result Date: 09/15/2017 CLINICAL DATA:  New onset RIGHT severe headache, blurry vision. Similar symptoms with prior stroke. History of diabetes, hypertension, hypercholesterolemia and stroke. EXAM: MRI HEAD WITHOUT CONTRAST TECHNIQUE: Multiplanar, multiecho pulse sequences of the brain and surrounding structures were obtained without intravenous contrast. COMPARISON:  CT HEAD September 15, 2017 and MRI of the head February 16, 2016 FINDINGS: INTRACRANIAL CONTENTS: No reduced diffusion to suggest acute ischemia. Numerous chronic micro hemorrhages in the general distribution similar to prior MRI. Patchy to confluent supratentorial white matter FLAIR T2 hyperintensities. Old small RIGHT cerebellar, RIGHT pontine and bilateral thalamus lacunar infarcts. Prominent basal ganglia perivascular spaces. No midline shift, mass effect or masses. No advanced parenchymal brain volume loss for age. No hydrocephalus. No abnormal extra-axial fluid collections. VASCULAR: Normal major intracranial vascular flow voids present at skull base. SKULL AND UPPER CERVICAL SPINE: No abnormal sellar expansion. No suspicious calvarial bone marrow signal. Craniocervical  junction maintained. SINUSES/ORBITS: Mild paranasal sinus mucosal thickening without air-fluid levels. Included mastoid air cells are well aerated.The included ocular globes and orbital contents are non-suspicious. OTHER: None. IMPRESSION: 1. No acute intracranial process. 2. Extensive chronic micro hemorrhages associated with severe chronic hypertension, less likely amyloid angiopathy. 3. Moderate to severe chronic small vessel ischemic disease and old lacunar infarcts. Electronically Signed   By: [name]   On: 09/15/2017 23:30      Assessment/Plan Uncontrolled T2DM - A1C 14.9, CBGs in 380s this AM Microcytic anemia PAF  Hx of CVA x3 HTN CKD stage II - Cr 1.11 OSA  R sided headache/blurred vision - CT/MRI negative for acute pathology yesterday  - treating with IV steroids x3 days  - ESR elevated at 51, CRP pending - INR is 3.46 this AM, patient on xarelto for PAF - can plan for temporal artery bx while patient hospitalized or can see patient in the outpatient setting and plan outpatient bx - either way would need to be cleared medically for surgery  - if inpatient dx desired - transition to heparin, would want INR 1.7 or less prior to OR  FEN: HH/CM diet VTE: SCDs, ?hold xarelto and switch to heparin? ID: no current abx  Brigid Re, [facility] Surgery 09/16/2017, 10:39 AM Pager: [phone] Consults: [phone] Mon-Fri 7:00 am-4:30 pm Sat-Sun 7:00 am-11:30 am

## 2017-09-16 NOTE — H&P (Signed)
[facility] Admission History and Physical Service Pager: [phone]  Patient name: [name] Medical record number: [id number] Date of birth: February 10, 1956 Age: 62 y.o. Gender: female  Primary Care Provider: Verner Mould, MD Consultants: General Surgery Code Status: FULL  Chief Complaint: headache  Assessment and Plan: [name] is a 62 y.o. female presenting with right temporal headache and blurred vision of the right eye. PMH is significant for paroxysmal A-fib on xarelto, hx CVA x 3, uncontrolled T2DM, HTN, CKD, HLD, OSA.  Right sided headache / blurred vision: Concern for temporal arteritis, given patient's unilateral headache, blurred vision, and focal tenderness to palpation of the temporal artery on the right. Patient is also in the right age range to develop this and her ESR was elevated at 51. Optic neuritis is also on the differential, given that patient has eye pain and blurred vision; although optic neuritis usually presents with central scotoma. Would also have expected to see evidence of this on her MRI. Retinal detachment unlikely without flashes/floaters and also does not usually present with eye pain. Migraine is also on the differential, as patient has a history of this. Cluster headache affecting the trigeminal nerve is also a possibility, as this could cause unilateral headache with pain behind the eye; however, patient not having associated tearing of the eye, rhinorrhea, or facial flushing. Stroke ruled out with negative MRI brain. - Admit to med-surg under inpatient status, attending Dr. Gwendlyn Deutscher.  - Treat with Solumedrol 1g x 3 days - Consult surgery in the AM for temporal artery biopsy - Consider ophthalmology consult if blurred vision persists - Obtain visual acuity - Neuro checks every 4 hours  Uncontrolled T2DM: Stopped taking Lantus in the summer of last year. Has been using Novolog 33 units three times per day and  Victoza 1.61m daily. Last A1c 14.1% on 11/04/2016. - Resistant SSI given obesity + very high dose steroids - Will need to start Lantus tonight - Will monitor blood sugars closely - Repeat A1c  Microcytic Anemia: Hgb 11.8, MCV 71.2.  - Will obtain iron, TIBC, ferritin  Paroxysmal A-fib: In sinus rhythm this admission. On Xarelto at home. CHADS-VASc is a 6. - Continue home Xarelto 237mdaily  Hx CVA: Has had three strokes, two in 2012 and one in 2017. Denies any residual deficits, but states she walks with a cane at home. - Continue home Xarelto 2027maily and Lipitor 66m73mily  HTN: Initially hypertensive to 157/68 in the ED. - Continue home Norvasc 10mg59mly, Clonidine 0.2mg b10m Lisinopril 40mg d57m, and Spironolactone 50mg da20m CKD II: Cr 1.11 on admission (BL 0.9-1.1). - Avoid nephrotoxic agents as able - Repeat AM BMP  HLD: Last lipid panel Chol 85, HDL 26, LDL 43, TG 82. - Continue home Lipitor 66mg dai60m Recheck lipid panel  OSA: Stable. - CPAP nightly  FEN/GI: Heart healthy carb-modified diet Prophylaxis: On Xarelto for A-fib  Disposition: Anticipate discharge home  History of Present Illness:  Carla M CALESIA OSHIELDSy.o. f6ale presenting with headaches. The headaches have started 1-2 days ago. The headache felt like "something was clogged and it took a good heartbeat to push it through".  The pain is located in her right temple and the back of her right eye. The pain is severe. The pain radiates to the top of her head. The pain comes in waves and lasts for 15-20 minutes and goes away for about an hour on its own. The  pain then returns. She is having blurriness of her right eye that started yesterday when her headache started. The blurriness has persisted. No loss of visual field. No flashing lights or floaters. No photophobia. She also endorses slight numbness of the right corner of her mouth, right arm, and right leg that started today. Her daughter noticed that she  was slurring her speech. She states she always feels weak, but no new weakness. She did not take any medication for the headache.    In the ED, she was mildly hypertensive to 157/68 with HR in the 60s. O2 saturations were 98-100% on RA. Labs were significant for blood sugar of 359, Cr 1.19, Hgb 11.8, INR 2.13. ESR 51. CT head was negative for any acute changes. MRI brain did not show any acute changes, but did show extensive chronic microhemorrhages associated with severe chronic hypertension. She was given a migraine cocktail, which didn't help her headache. There was concern that she may have temporal arteritis, so she was given 1g IV solumedrol and was admitted for further management.  Review Of Systems: Per HPI with the following additions: see below  Review of Systems  Constitutional: Negative for chills and fever.  HENT: Negative for hearing loss, sinus pain and sore throat.   Eyes: Positive for blurred vision and pain. Negative for photophobia.  Respiratory: Negative for cough and shortness of breath.   Cardiovascular: Positive for palpitations. Negative for chest pain.  Gastrointestinal: Negative for nausea and vomiting.  Genitourinary: Positive for frequency and urgency. Negative for dysuria.  Musculoskeletal: Positive for falls. Negative for myalgias.  Neurological: Positive for headaches. Negative for speech change.    Patient Active Problem List   Diagnosis Date Noted  . History of stroke 03/06/2017  . BV (bacterial vaginosis)   . Stroke (cerebrum) ([place]) 02/16/2016  . Stroke ([place]) 02/16/2016  . Acute ischemic stroke ([place])   . Type 2 diabetes mellitus with circulatory disorder, with long-term current use of insulin ([place])   . Paroxysmal atrial fibrillation ([facility])   . Upper back pain 10/12/2015  . Abdominal pain 06/11/2015  . Leg cramps, sleep related 06/11/2015  . Pain of right scapula 03/14/2015  . Dental abscess 12/07/2014  . Chronic dental pain 10/13/2014  . Intractable  migraine without aura and without status migrainosus 02/21/2014  . Dyspnea 01/24/2014  . Mild obstructive sleep apnea 11/18/2013  . Insomnia 11/18/2013  . Trapezius muscle strain 09/07/2013  . Other social stressor 07/15/2013  . Sinus congestion 07/12/2013  . Toenail fungus 12/30/2012  . Obese 10/22/2012  . Abnormality of gait and mobility 08/18/2011  . Edema 08/12/2011  . Constipation 05/28/2011  . History of CVA (cerebrovascular accident) 05/20/2011  . Chest pain 05/20/2011  . Hot flashes 04/28/2011  . Headache 11/18/2010  . Long term (current) use of anticoagulants 09/14/2010  . CARPAL TUNNEL SYNDROME, LEFT 02/19/2010  . Diabetes mellitus out of control ([place]) 06/12/2009  . Atrial fibrillation ([place]) 06/07/2009  . Dermatophytosis of foot 03/13/2009  . Depression 09/29/2008  . Hyperlipidemia associated with type 2 diabetes mellitus ([place]) 08/22/2008  . CHRONIC KIDNEY DISEASE STAGE II (M[facility]) 08/01/2008  . STRESS INCONTINENCE 07/31/2008  . Essential hypertension 04/16/2007    Past Medical History: Past Medical History:  Diagnosis Date  . Atrial fibrillation ([place])   . CVA (cerebral vascular accident) ([place])    left pontine and frontal lobe  . Diabetes mellitus    type 2  . Hypercholesteremia   . Hypertension   . Hypertension  Hypertensive urgency 05/2006  . TIA (transient ischemic attack) 2017    Past Surgical History: Past Surgical History:  Procedure Laterality Date  . DILATION AND CURETTAGE OF UTERUS  1976  . MENISCUS REPAIR  03/09   right knee  . TONSILECTOMY, ADENOIDECTOMY, BILATERAL MYRINGOTOMY AND TUBES  age 76  . TONSILLECTOMY      Social History: Social History   Tobacco Use  . Smoking status: Never Smoker  . Smokeless tobacco: Never Used  Substance Use Topics  . Alcohol use: No  . Drug use: No   Additional social history: Lives at home with her daughter. Uses a cane at home. She requires assistance with bathing. Please also refer to relevant  sections of EMR.  Family History: Family History  Problem Relation Age of Onset  . Heart failure Mother   . Diabetes Father   . Hypertension Father   . Lung cancer Sister   . Stroke Brother     Allergies and Medications: Allergies  Allergen Reactions  . Propoxyphene N-Acetaminophen Hives, Itching, Swelling and Other (See Comments)    REACTION: Hallucinations  . Glipizide Nausea And Vomiting  . Metformin And Related Diarrhea   No current facility-administered medications on file prior to encounter.    Current Outpatient Medications on File Prior to Encounter  Medication Sig Dispense Refill  . albuterol (PROVENTIL HFA;VENTOLIN HFA) 108 (90 Base) MCG/ACT inhaler Inhale 2 puffs into the lungs every 6 (six) hours as needed for wheezing or shortness of breath. 1 Inhaler 2  . amLODipine (NORVASC) 10 MG tablet Take 1 tablet (10 mg total) by mouth daily. 90 tablet 3  . amoxicillin-clavulanate (AUGMENTIN) 875-125 MG tablet Take 1 tablet by mouth 2 (two) times daily. 20 tablet 0  . atorvastatin (LIPITOR) 80 MG tablet Take 1 tablet (80 mg total) by mouth daily at 6 PM. 90 tablet 3  . calcipotriene (DOVONOX) 0.005 % cream     . cetirizine (ZYRTEC) 10 MG tablet Take 1 tablet (10 mg total) by mouth daily. (Patient not taking: Reported on 03/06/2017) 30 tablet 11  . cloNIDine (CATAPRES) 0.2 MG tablet Take 1 tablet (0.2 mg total) by mouth 2 (two) times daily. 180 tablet 3  . cyclobenzaprine (FLEXERIL) 5 MG tablet Take 1 tablet (5 mg total) by mouth 3 (three) times daily as needed for muscle spasms. 10 tablet 0  . fluticasone (FLONASE) 50 MCG/ACT nasal spray Place 2 sprays into both nostrils daily. 16 g 6  . insulin aspart (NOVOLOG) 100 UNIT/ML injection Inject 20 units under the skin with breakfast and 30 units under the skin with lunch 10 mL 2  . Insulin Glargine (LANTUS SOLOSTAR) 100 UNIT/ML Solostar Pen ADMINISTER 65 UNITS UNDER THE SKIN DAILY 15 mL 2  . liraglutide (VICTOZA) 18 MG/3ML SOPN Inject  0.3 mLs (1.8 mg total) into the skin daily. 6 mL 2  . lisinopril (PRINIVIL,ZESTRIL) 40 MG tablet TAKE 1 TABLET(40 MG) BY MOUTH DAILY 90 tablet 3  . meloxicam (MOBIC) 7.5 MG tablet Take 1 tablet (7.5 mg total) by mouth daily. 14 tablet 0  . rivaroxaban (XARELTO) 20 MG TABS tablet Take 1 tablet (20 mg total) by mouth daily with supper. 90 tablet 3  . spironolactone (ALDACTONE) 50 MG tablet Take 1 tablet (50 mg total) by mouth daily. 90 tablet 3    Objective: BP 127/72   Pulse 65   Resp 17   Wt 243 lb (110.2 kg)   SpO2 98%   BMI 44.45 kg/m  Exam: General:  Well-appearing, sitting up in bed in NAD Eyes: PERRLA, EOMI ENTM: Nose normal, MMM Head/neck: neck supple, no cervical lymphadenopathy; +focal tenderness to palpation over the temporal artery on the right. Cardiovascular: RRR, no murmurs Respiratory: CTAB, normal work of breathing Gastrointestinal: +BS, soft, non-tender, non-distended MSK: No edema, warm and well-perfused Derm: No rashes or lesions on exposed skin Neuro: Awake, alert, oriented, no visual field deficits, CN 2-12 intact, 5/5 muscle strength in upper and lower extremities bilaterally, ?mildly decreased sensation to light touch in the right arm and leg, reflexes symmetric, normal finger-to-nose bilaterally Psych: Appropriate affect, normal behavior.  Labs and Imaging: CBC BMET  Recent Labs  Lab 09/15/17 1555 09/15/17 1615  WBC 8.5  --   HGB 11.8* 13.3  HCT 37.4 39.0  PLT 308  --    Recent Labs  Lab 09/15/17 1555 09/15/17 1615  NA 136 139  K 4.2 4.7  CL 105 103  CO2 19*  --   BUN 18 25*  CREATININE 1.19* 1.00  GLUCOSE 359* 361*  CALCIUM 9.4  --      -INR 2.13 -ESR 51 -CT head was negative for any acute changes -MRI brain: no acute changes, but did show extensive chronic microhemorrhages associated with severe chronic hypertension  Mayo, Pete Pelt, MD 09/16/2017, 1:37 AM PGY-3, [facility] pager: [phone], text pages  welcome

## 2017-09-17 ENCOUNTER — Encounter: Payer: Medicare Other | Admitting: Internal Medicine

## 2017-09-17 DIAGNOSIS — E1122 Type 2 diabetes mellitus with diabetic chronic kidney disease: Secondary | ICD-10-CM

## 2017-09-17 DIAGNOSIS — I4891 Unspecified atrial fibrillation: Secondary | ICD-10-CM

## 2017-09-17 DIAGNOSIS — Z794 Long term (current) use of insulin: Secondary | ICD-10-CM

## 2017-09-17 DIAGNOSIS — N182 Chronic kidney disease, stage 2 (mild): Secondary | ICD-10-CM

## 2017-09-17 LAB — CBC
HCT: 37.7 % (ref 36.0–46.0)
Hemoglobin: 12.4 g/dL (ref 12.0–15.0)
MCH: 23 pg — AB (ref 26.0–34.0)
MCHC: 32.9 g/dL (ref 30.0–36.0)
MCV: 70.1 fL — AB (ref 78.0–100.0)
Platelets: 348 10*3/uL (ref 150–400)
RBC: 5.38 MIL/uL — ABNORMAL HIGH (ref 3.87–5.11)
RDW: 14.6 % (ref 11.5–15.5)
WBC: 25.7 10*3/uL — AB (ref 4.0–10.5)

## 2017-09-17 LAB — BASIC METABOLIC PANEL
ANION GAP: 13 (ref 5–15)
BUN: 25 mg/dL — ABNORMAL HIGH (ref 6–20)
CO2: 19 mmol/L — AB (ref 22–32)
Calcium: 9.5 mg/dL (ref 8.9–10.3)
Chloride: 103 mmol/L (ref 101–111)
Creatinine, Ser: 1.4 mg/dL — ABNORMAL HIGH (ref 0.44–1.00)
GFR calc Af Amer: 46 mL/min — ABNORMAL LOW (ref >60)
GFR calc non Af Amer: 39 mL/min — ABNORMAL LOW (ref >60)
GLUCOSE: 366 mg/dL — AB (ref 65–99)
Potassium: 4.3 mmol/L (ref 3.5–5.1)
Sodium: 135 mmol/L (ref 135–145)

## 2017-09-17 LAB — GLUCOSE, CAPILLARY
GLUCOSE-CAPILLARY: 386 mg/dL — AB (ref 65–99)
GLUCOSE-CAPILLARY: 365 mg/dL — AB (ref 65–99)
Glucose-Capillary: 335 mg/dL — ABNORMAL HIGH (ref 65–99)
Glucose-Capillary: 456 mg/dL — ABNORMAL HIGH (ref 65–99)
Glucose-Capillary: 421 mg/dL — ABNORMAL HIGH (ref 65–99)
Glucose-Capillary: 336 mg/dL — ABNORMAL HIGH (ref 65–99)

## 2017-09-17 LAB — APTT
aPTT: 34 seconds (ref 24–36)
aPTT: 96 seconds — ABNORMAL HIGH (ref 24–36)

## 2017-09-17 LAB — PROTIME-INR
INR: 1.61
Prothrombin Time: 19 seconds — ABNORMAL HIGH (ref 11.4–15.2)

## 2017-09-17 LAB — HEPARIN LEVEL (UNFRACTIONATED): Heparin Unfractionated: >2.2 IU/mL — ABNORMAL HIGH (ref 0.30–0.70)

## 2017-09-17 MED ORDER — INSULIN ASPART 100 UNIT/ML ~~LOC~~ SOLN
5.0000 [IU] | Freq: Once | SUBCUTANEOUS | Status: AC
  Administered 2017-09-17: 5 [IU] via SUBCUTANEOUS

## 2017-09-17 MED ORDER — TRAMADOL HCL 50 MG PO TABS
50.0000 mg | ORAL_TABLET | Freq: Four times a day (QID) | ORAL | Status: DC | PRN
  Administered 2017-09-17 – 2017-09-19 (×6): 100 mg via ORAL
  Filled 2017-09-17 (×6): qty 2

## 2017-09-17 MED ORDER — INSULIN GLARGINE 100 UNIT/ML ~~LOC~~ SOLN
45.0000 [IU] | Freq: Every day | SUBCUTANEOUS | Status: DC
  Administered 2017-09-17: 45 [IU] via SUBCUTANEOUS
  Filled 2017-09-17: qty 0.45

## 2017-09-17 NOTE — Progress Notes (Signed)
Consent signed and in chart.  Hav, RN

## 2017-09-17 NOTE — Progress Notes (Signed)
Subjective/Chief Complaint: Still with right sided headache   Objective: Vital signs in last 24 hours: Temp:  [98 F (36.7 C)-99.2 F (37.3 C)] 99.1 F (37.3 C) (02/14 0534) Pulse Rate:  [71-105] 71 (02/14 0534) Resp:  [18-20] 18 (02/14 0534) BP: (136-152)/(68-87) 149/68 (02/14 0534) SpO2:  [96 %-100 %] 98 % (02/14 0534)    Intake/Output from previous day: 02/13 0701 - 02/14 0700 In: 538 [P.O.:480; IV Piggyback:58] Out: -  Intake/Output this shift: No intake/output data recorded.  Exam: Palp right temporal artery  Lab Results:  Recent Labs    09/16/17 0302 09/17/17 0258  WBC 8.4 25.7*  HGB 11.0* 12.4  HCT 34.9* 37.7  PLT 308 348   BMET Recent Labs    09/16/17 0302 09/17/17 0258  NA 138 135  K 4.1 4.3  CL 106 103  CO2 22 19*  GLUCOSE 332* 366*  BUN 16 25*  CREATININE 1.11* 1.40*  CALCIUM 9.0 9.5   PT/INR Recent Labs    09/16/17 0748 09/17/17 0258  LABPROT 34.5* 19.0*  INR 3.46 1.61   ABG No results for input(s): PHART, HCO3 in the last 72 hours.  Invalid input(s): PCO2, PO2  Studies/Results: Ct Head [name] Contrast  Result Date: 09/15/2017 CLINICAL DATA:  Acute headache, blurry vision. EXAM: CT HEAD WITHOUT CONTRAST TECHNIQUE: Contiguous axial images were obtained from the base of the skull through the vertex without intravenous contrast. COMPARISON:  CT scan of January 06, 2016. FINDINGS: Brain: Mild chronic ischemic white matter disease is noted. No mass effect or midline shift is noted. Ventricular size is within normal limits. There is no evidence of mass lesion, hemorrhage or acute infarction. Vascular: No hyperdense vessel or unexpected calcification. Skull: Normal. Negative for fracture or focal lesion. Sinuses/Orbits: Probable chronic right ethmoid and maxillary sinusitis is noted. Other: None. IMPRESSION: Mild chronic ischemic white matter disease. No acute intracranial abnormality seen. Probable chronic right ethmoid and maxillary sinusitis.  Electronically Signed   By: [name], [name]   On: 09/15/2017 16:33   [name] Contrast  Result Date: 09/15/2017 CLINICAL DATA:  New onset RIGHT severe headache, blurry vision. Similar symptoms with prior stroke. History of diabetes, hypertension, hypercholesterolemia and stroke. EXAM: MRI HEAD WITHOUT CONTRAST TECHNIQUE: Multiplanar, multiecho pulse sequences of the brain and surrounding structures were obtained without intravenous contrast. COMPARISON:  CT HEAD September 15, 2017 and MRI of the head February 16, 2016 FINDINGS: INTRACRANIAL CONTENTS: No reduced diffusion to suggest acute ischemia. Numerous chronic micro hemorrhages in the general distribution similar to prior MRI. Patchy to confluent supratentorial white matter FLAIR T2 hyperintensities. Old small RIGHT cerebellar, RIGHT pontine and bilateral thalamus lacunar infarcts. Prominent basal ganglia perivascular spaces. No midline shift, mass effect or masses. No advanced parenchymal brain volume loss for age. No hydrocephalus. No abnormal extra-axial fluid collections. VASCULAR: Normal major intracranial vascular flow voids present at skull base. SKULL AND UPPER CERVICAL SPINE: No abnormal sellar expansion. No suspicious calvarial bone marrow signal. Craniocervical junction maintained. SINUSES/ORBITS: Mild paranasal sinus mucosal thickening without air-fluid levels. Included mastoid air cells are well aerated.The included ocular globes and orbital contents are non-suspicious. OTHER: None. IMPRESSION: 1. No acute intracranial process. 2. Extensive chronic micro hemorrhages associated with severe chronic hypertension, less likely amyloid angiopathy. 3. Moderate to severe chronic small vessel ischemic disease and old lacunar infarcts. Electronically Signed   By: [name]   On: 09/15/2017 23:30    Anti-infectives: Anti-infectives (From admission, onward)   None  Assessment/Plan: Headache, r/o temporal arteritis  WBC up  today.  ?steroids Plan OR tomorrow.  Heparin needs to stop at 5 AM tomorrow morning. I discussed the procedure and the risks with her again today.  LOS: 1 day    [name] A 09/17/2017

## 2017-09-17 NOTE — Progress Notes (Signed)
BG at 456. SSI given per order and to call MD.  MD paged. Awaiting for call  Back.   Dr. Grandville Silos returned call to recheck BG within an hour and notify MD of result. Patient appears asymptomatic. Will continue to monitor.   Ave Filter, RN

## 2017-09-17 NOTE — Progress Notes (Signed)
Occupational Therapy Evaluation Patient Details Name: [name] MRN: [id number] DOB: 01-29-1956 Today's Date: 09/17/2017    History of Present Illness [name] is a 62 y.o. female presenting with right temporal headache and blurred vision of the right eye. PMH:  paroxysmal A-fib on xarelto, hx CVA x 3, uncontrolle T2DM, HTN, CKD, HLD, OSA.   MRI negative; Plan is for temporal artery bx 09/18/17      Clinical Impression   PTA, pt was modified independent with ADL and mobility using her cane. Pt states her daughter lives with her to assist as needed. Pt demonstrates difficulty with LB ADL and is interesting in trying AE to increase her independence. Pt states it is difficult for her to see to read and that her vision is worse in her R eye- began discussing compensatory strategies, including increasing light and contrast and reducing clutter. Will follow acutely to maximize functional level of independence to facilitate safe DC home.     Follow Up Recommendations  [name] OT follow up;Supervision - Intermittent    Equipment Recommendations  3 in 1 bedside commode    Recommendations for Other Services       Precautions / Restrictions Precautions Precautions: Fall -  Restrictions Weight Bearing Restrictions: [name]      Mobility Bed Mobility Overal bed mobility: Modified Independent             General bed mobility comments: HOB elevated  Transfers Overall transfer level: Modified independent Equipment used: None                  Balance Overall balance assessment: Needs assistance(remote hx of 1 fall, when "getting up too fast") Sitting-balance support: Feet supported;[name] upper extremity supported;Feet unsupported Sitting balance-Leahy Scale: Normal       Standing balance-Leahy Scale: Good Standing balance comment: fair     had a fall in October                       ADL either performed or assessed with clinical judgement   ADL Overall ADL's :  Needs assistance/impaired     Grooming: Set up   Upper Body Bathing: Set up;Sitting   Lower Body Bathing: Minimal assistance;Sit to/from stand   Upper Body Dressing : Set up;Sitting   Lower Body Dressing: Minimal assistance Lower Body Dressing Details (indicate cue type and reason): Pt states it is difficult for her to donn/doff socks Toilet Transfer: Modified Independent Toilet Transfer Details (indicate cue type and reason): from higher toilet. Pt states it is difficult to get off of her toilet at home         Functional mobility during ADLs: Modified independent       Vision   Vision Assessment?: Vision impaired- to be further tested in functional context Additional Comments: Complains of blurred vision R eye; States it is difficult for her to read. Began to educate pt on compensatory strategies for low vision regarding light, contrast and reducing clutter     Perception     Praxis      Pertinent Vitals/Pain Pain Assessment: [name]/denies pain     Hand Dominance Right   Extremity/Trunk Assessment Upper Extremity Assessment Upper Extremity Assessment: Overall WFL for tasks assessed   Lower Extremity Assessment Lower Extremity Assessment: Overall WFL for tasks assessed   Cervical / Trunk Assessment Cervical / Trunk Assessment: Normal   Communication Communication Communication: [name] difficulties   Cognition Arousal/Alertness: Awake/alert Behavior During Therapy: WFL for tasks assessed/performed  Overall Cognitive Status: Within Functional Limits for tasks assessed                                     General Comments       Exercises     Shoulder Instructions      Home Living Family/patient expects to be discharged to:: Private residence Living Arrangements: Children Available Help at Discharge: Family;Available 24 hours/day Type of Home: House Home Access: Stairs to enter [employer] of Steps: 2   Home Layout: One level      Bathroom Shower/Tub: Teacher, early years/pre: Standard Bathroom Accessibility: [name]   Home Equipment: Environmental consultant - 4 wheels;Cane - single point;Shower seat;Tub bench   Additional Comments: 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"      Prior Functioning/Environment Level of Independence: Independent with assistive device(s)        Comments: amb with cane inside home; uses rollator for longer distances d/t back pain and fatigue with standing        OT Problem List: Impaired vision/perception;Decreased knowledge of use of DME or AE;Obesity      OT Treatment/Interventions: Self-care/ADL training;DME and/or AE instruction;Therapeutic activities;Visual/perceptual remediation/compensation;Balance training    OT Goals(Current goals can be found in the care plan section) Acute Rehab OT Goals Patient Stated Goal: to feel better OT Goal Formulation: With patient Time For Goal Achievement: 10/01/17 Potential to Achieve Goals: Good  OT Frequency: Min 2X/week   Barriers to D/C:            Co-evaluation              AM-PAC PT "6 Clicks" Daily Activity     Outcome Measure Help from another person eating meals?: None Help from another person taking care of personal grooming?: None Help from another person toileting, which includes using toliet, bedpan, or urinal?: None Help from another person bathing (including washing, rinsing, drying)?: A Little Help from another person to put on and taking off regular upper body clothing?: None Help from another person to put on and taking off regular lower body clothing?: A Little 6 Click Score: 22   End of Session Nurse Communication: Mobility status  Activity Tolerance: Patient tolerated treatment well Patient left: in bed;with call bell/phone within reach  OT Visit Diagnosis: Low vision, both eyes (H54.2);Muscle weakness (generalized) (M62.81)                Time: 9449-6759 OT Time  Calculation (min): 17 min Charges:  OT General Charges $OT Visit: 1 Visit OT Evaluation $OT Eval Low Complexity: 1 Low G-Codes:     [place], OT/L  [phone] 09/17/2017  Jeanne Terrance,HILLARY 09/17/2017, 3:41 PM

## 2017-09-17 NOTE — Discharge Summary (Signed)
[name]  Patient name: [name] Medical record number: [id number] Date of birth: Feb 05, 1956 Age: 62 y.o. Gender: female Date of Admission: 09/15/2017  Date of Discharge: 09/19/2017 Admitting Physician: Kinnie Feil, MD  Primary Care Provider: Verner Mould, MD Consultants: Ophthalmology, general surgery  Indication for Hospitalization: Right-sided headache with blurred vision   Discharge Diagnoses/Problem List:  Migraine Headache Paroxysmal atrial fibrillation History of CVA x3 Uncontrolled type 2 diabetes CKD stage II  Disposition: Home  Discharge Condition: Improved  Discharge Exam:  General: sittting up in bed comfortably, in NAD HEENT: [place]. EOMI, PERRL. Peripheral fields intact. Able to read sign across room with L eye, unable to read sign with R eye but can see the sign. R temporal incision clean and dry without erythema. Cardiovascular: RRR, no murmurs Respiratory: CTAB, normal work of breathing on room air Abdomen: soft, nontender, nondistended, + bowel sounds Extremities: warm and well perfused, no LE edema  Neuro: alert and awake, grossly normal. 5/5 strength in UE and LE. Sensation intact throughout.  Brief Hospital Course:  [name] is a 62 y.o. female who presented with 2 days of right-sided headache with some blurry vision and right numb this of the lip.  Initially presentation was concerning for stroke given patient's significant history.  However,patient had negative CT and MRI for stroke. Patient care was transitioned for concern of GCA versus other intraocular process such as retinal detachment, venous occlusion. CBC and CMP were grossly normal on admission.  ESR was slightly elevated at 51, CRP was within normal limits.  Patient did endorse some jaw claudication and increasing fatigue.  Patient was started on 1 mg of IV Solu-Medrol for 3 days. Visual acuity was intact on admit. Patient  experienced significant increase in leukocytosis status post receiving steroids.  Patient went from WBC of 8 to 25.  Patient showed no signs of active infection including being afebrile, hemodynamically stable, no URI symptoms of cough, congestion, chest pain, no dysuria.  Her only persistent complaint was her headache which was treated with tramadol.  General surgery was consulted for temporal artery biopsy.  Patient's Xarelto was discontinued and INR was titrated to around 1.7 per surgery's recommendations.  Patient underwent GCA biopsy on 02/215/19.  Biopsy results were negative for vasculitis.  During hospitalization patient was found to have an A1c of 14.9.  Patient uses Victoza 1.8 mg daily and 33 units of NovoLog 3 times daily with meals. Patient exhibited significant hyperglycemia secondary to steroids with CBGs up to 400, but the majority of them ranging in the 300s.  Inpatient she was started on sliding scale resistant and basal insulin Lantus.  She was discharged on  55 Lantus and to continue her NovoLog and Victoza.  After the procedure, patient was restarted on her Xarelto.  Her headache was improved and well controlled on PO medications.  Patient was discharged with a taper of steroids.   Issues for Follow Up:  1. Needs outpatient ophthalmology referral as was never evaluated inpatient. Safety zone portal was filed.  2. Titrate patient's basal Lantus insulin as necessary to maintain adequate CBG especially while on steroids.  Consider addition of metformin. 3. Prednisone 62m for 2 weeks on DC followed by taper outpatient. Taper for giant cell arteritis is prolonged. If biopsy neg and evaluated by ophthalmology then may be able to DC faster 4. Recheck BMP to ensure Cr back to baseline 5. One time Rx for tramadol given on DC for  headaches, please ascertain if continued use is necessary.   Significant Procedures: Temporal artery biopsy on 2/15  Significant Labs and Imaging:   Artery,  temporal, biopsy Pathology  - BENIGN VASCULAR STRUCTURE. - THERE IS NO EVIDENCE OF SIGNIFICANT INFLAMMATION (ARTERITIS).  Recent Labs  Lab 09/17/17 0258 09/18/17 0226 09/19/17 0242  WBC 25.7* 30.1* 23.8*  HGB 12.4 11.8* 11.2*  HCT 37.7 36.5 35.4*  PLT 348 355 341   Recent Labs  Lab 09/15/17 1555 09/15/17 1615 09/16/17 0302 09/17/17 0258 09/18/17 0226 09/19/17 0242  NA 136 139 138 135 136 135  K 4.2 4.7 4.1 4.3 4.6 4.9  CL 105 103 106 103 104 103  CO2 19*  --  22 19* 21* 22  GLUCOSE 359* 361* 332* 366* 327* 310*  BUN 18 25* 16 25* 32* 34*  CREATININE 1.19* 1.00 1.11* 1.40* 1.40* 1.25*  CALCIUM 9.4  --  9.0 9.5 9.6 8.9  ALKPHOS 82  --   --   --   --   --   AST 17  --   --   --   --   --   ALT 13*  --   --   --   --   --   ALBUMIN 3.5  --   --   --   --   --    ESR 09/15/17: 51 elevated   CRP 09/16/17: 0.9 normal   Hemoglobin a1c 09/16/17: 14.9  Ct Head Wo Contrast Result Date: 09/15/2017 IMPRESSION: Mild chronic ischemic white matter disease. No acute intracranial abnormality seen. Probable chronic right ethmoid and maxillary sinusitis. Electronically Signed   By: [name], [name]   On: 09/15/2017 16:33   Mr Brain Wo Contrast Result Date: 09/15/2017 MPRESSION: 1. No acute intracranial process. 2. Extensive chronic micro hemorrhages associated with severe chronic hypertension, less likely amyloid angiopathy. 3. Moderate to severe chronic small vessel ischemic disease and old lacunar infarcts. Electronically Signed   By: [name]   On: 09/15/2017 23:30      Results/Tests Pending at Time of Discharge: Temporal artery biopsy results  Discharge Medications:  Allergies as of 09/19/2017      Reactions   Propoxyphene N-acetaminophen Hives, Itching, Swelling, Other (See Comments)   REACTION: Hallucinations   Glipizide Nausea And Vomiting   Metformin And Related Diarrhea      Medication List    TAKE these medications   acetaminophen 325 MG  tablet Commonly known as:  TYLENOL Take 2 tablets (650 mg total) by mouth every 6 (six) hours as needed for headache.   amLODipine 10 MG tablet Commonly known as:  NORVASC Take 1 tablet (10 mg total) by mouth daily.   atorvastatin 80 MG tablet Commonly known as:  LIPITOR Take 1 tablet (80 mg total) by mouth daily at 6 PM.   cloNIDine 0.2 MG tablet Commonly known as:  CATAPRES Take 1 tablet (0.2 mg total) by mouth 2 (two) times daily.   fluticasone 50 MCG/ACT nasal spray Commonly known as:  FLONASE Place 2 sprays into both nostrils daily.   insulin aspart 100 UNIT/ML injection Commonly known as:  novoLOG Inject 20 units under the skin with breakfast and 30 units under the skin with lunch What changed:    how much to take  how to take this  when to take this  additional instructions   insulin glargine 100 UNIT/ML injection Commonly known as:  LANTUS Inject 0.55 mLs (55 Units total) into the skin  at bedtime.   liraglutide 18 MG/3ML Sopn Commonly known as:  VICTOZA Inject 0.3 mLs (1.8 mg total) into the skin daily.   lisinopril 40 MG tablet Commonly known as:  PRINIVIL,ZESTRIL TAKE 1 TABLET(40 MG) BY MOUTH DAILY   meloxicam 7.5 MG tablet Commonly known as:  MOBIC Take 1 tablet (7.5 mg total) by mouth daily.   predniSONE 10 MG tablet Commonly known as:  DELTASONE Take 6 tablets (60 mg total) by mouth daily with breakfast for 14 days, THEN 5 tablets (50 mg total) daily with breakfast for 14 days, THEN 4 tablets (40 mg total) daily with breakfast for 14 days, THEN 3 tablets (30 mg total) daily with breakfast for 14 days, THEN 2 tablets (20 mg total) daily with breakfast for 14 days, THEN 1 tablet (10 mg total) daily with breakfast for 14 days. Start taking on:  09/20/2017   rivaroxaban 20 MG Tabs tablet Commonly known as:  XARELTO Take 1 tablet (20 mg total) by mouth daily with supper.   spironolactone 50 MG tablet Commonly known as:  ALDACTONE Take 1 tablet (50 mg  total) by mouth daily.   traMADol 50 MG tablet Commonly known as:  ULTRAM Take 1-2 tablets (50-100 mg total) by mouth every 6 (six) hours as needed for severe pain.       Discharge Instructions: Please refer to Patient Instructions section of EMR for full details.  Patient was counseled important signs and symptoms that should prompt return to medical care, changes in medications, dietary instructions, activity restrictions, and follow up appointments.   Follow-Up Appointments: Follow-up Information    Sherene Sires, DO. Go on 09/22/2017.   Specialty:  Family Medicine Why:  Hospital follow up appointment at 9:25am, arrive 15 min early for check in. Contact information: [address] [place] [address] [phone]           Bonnita Hollow, MD 09/21/2017, 1:54 PM PGY-1, [place]

## 2017-09-17 NOTE — Progress Notes (Signed)
[place] for Heparin Indication: atrial fibrillation  Allergies  Allergen Reactions  . Propoxyphene N-Acetaminophen Hives, Itching, Swelling and Other (See Comments)    REACTION: Hallucinations  . Glipizide Nausea And Vomiting  . Metformin And Related Diarrhea    Patient Measurements: Weight: 243 lb (110.2 kg)  Ideal Body Weight: 50 kg Heparin Dosing Weight: 76 kg  Vital Signs: Temp: 98.4 F (36.9 C) (02/14 0942) Temp Source: Oral (02/14 0942) BP: 136/70 (02/14 0942) Pulse Rate: 76 (02/14 0942)  Labs: Recent Labs    09/15/17 1555 09/15/17 1615 09/16/17 0302 09/16/17 0748 09/17/17 0226 09/17/17 0258  HGB 11.8* 13.3 11.0*  --   --  12.4  HCT 37.4 39.0 34.9*  --   --  37.7  PLT 308  --  308  --   --  348  APTT 42*  --   --   --   --  34  LABPROT 23.6*  --   --  34.5*  --  19.0*  INR 2.13  --   --  3.46  --  1.61  HEPARINUNFRC  --   --   --   --  >2.20*  --   CREATININE 1.19* 1.00 1.11*  --   --  1.40*    CrCl cannot be calculated (Unknown ideal weight.).   Medical History: Past Medical History:  Diagnosis Date  . Atrial fibrillation ([place])   . CVA (cerebral vascular accident) ([place])    left pontine and frontal lobe  . Diabetes mellitus    type 2  . Hypercholesteremia   . Hypertension   . Hypertension    Hypertensive urgency 05/2006  . TIA (transient ischemic attack) 2017    Medications:  Medications Prior to Admission  Medication Sig Dispense Refill Last Dose  . amLODipine (NORVASC) 10 MG tablet Take 1 tablet (10 mg total) by mouth daily. 90 tablet 3 09/15/2017 at Unknown time  . atorvastatin (LIPITOR) 80 MG tablet Take 1 tablet (80 mg total) by mouth daily at 6 PM. 90 tablet 3 09/15/2017 at Unknown time  . cloNIDine (CATAPRES) 0.2 MG tablet Take 1 tablet (0.2 mg total) by mouth 2 (two) times daily. 180 tablet 3 09/15/2017 at Unknown time  . fluticasone (FLONASE) 50 MCG/ACT nasal spray Place 2 sprays into both nostrils  daily. 16 g 6 Past Month at Unknown time  . insulin aspart (NOVOLOG) 100 UNIT/ML injection Inject 20 units under the skin with breakfast and 30 units under the skin with lunch (Patient taking differently: Inject 33 Units into the skin 3 (three) times daily with meals. ) 10 mL 2 09/15/2017 at Unknown time  . liraglutide (VICTOZA) 18 MG/3ML SOPN Inject 0.3 mLs (1.8 mg total) into the skin daily. 6 mL 2 09/15/2017 at Unknown time  . lisinopril (PRINIVIL,ZESTRIL) 40 MG tablet TAKE 1 TABLET(40 MG) BY MOUTH DAILY 90 tablet 3 09/15/2017 at Unknown time  . meloxicam (MOBIC) 7.5 MG tablet Take 1 tablet (7.5 mg total) by mouth daily. 14 tablet 0 09/15/2017 at Unknown time  . rivaroxaban (XARELTO) 20 MG TABS tablet Take 1 tablet (20 mg total) by mouth daily with supper. 90 tablet 3 09/14/2017 at 2300  . spironolactone (ALDACTONE) 50 MG tablet Take 1 tablet (50 mg total) by mouth daily. 90 tablet 3 09/15/2017 at Unknown time   Assessment: 89 YOF who presented to the ED with headache and blurred vision. On rivaroxaban at home for h/o Afib. Pharmacy consulted to start IV  heparin until INR down to 1.7 for surgery. Last dose of rivaroxaban was 2/13 at 0322. CBC stable. H/H low. Plt wnl. BL aPTT was 42 on 2/12.  Heparin started ~0330 this morning; AM have not been collected; heparin infusion is to be stopped tomorrow morning at 0500 per Dr. Grandville Silos. No overt bleeding documented.  Goal of Therapy:  Heparin level 0.3-0.7 units/ml  APTT 66-102 s Monitor platelets by anticoagulation protocol: Yes   Plan:  -Continue heparin at 1200 units/hr   -Ordered stat aPTT -Monitor daily Heparin Level, aPTT, CBC while on heparin -Monitor for s/sx of bleeding  Georga Bora, PharmD Clinical Pharmacist 09/17/2017 12:30 PM

## 2017-09-17 NOTE — Progress Notes (Signed)
Family Medicine Teaching Service Daily Progress Note Intern Pager: [phone]  Patient name: [name] Medical record number: [id number] Date of birth: [birth year] Age: 62 y.o. Gender: female  Primary Care Provider: Verner Mould, MD Consultants: Surgery, ophthalmology Code Status: Full  Pt Overview and Major Events to Date:  [name] is a 62 y.o. female presenting with right temporal headache and blurred vision of the right eye. PMH is significant for paroxysmal A-fib on xarelto, hx CVA x 3, uncontrolled T2DM, HTN, CKD, HLD, OSA.  Assessment and Plan:  Right sided headache / blurred vision:  Unsure exact etiology. History is concerning for GCA given significant stroke history, right-sided headache, and history of blurry vision, endorsing jaw claudication, fatigue, weight loss.  There are some elements though that do not make this as likely given she has a mildly elevated sed rate of 50, normal CRP, intact visual acuity when examined at bedside.  Still endorsing headache.  Have consulted general surgery for temporal artery biopsy.  Patient is on day 2 of 3 of IV Solu-Medrol.  Patient had increase in WBC from 8 to 25 overnight.  Patient has received steroids and these can cause a mild increase in leukocytosis, it is uncommon that this would cause such a market increase overnight.  Concern for some occult intraocular versus intracranial infection given symptoms.  Although this seems unlikely as patient has been afebrile, and has had negative head CT and MRI although these may not be the most effective etiologies to assess for intraocular infection.  Patient is planned for artery biopsy when INR is below 1.7.  Patient has INR at this level now.  Plan for temporal artery biopsy tomorrow a.m.  Ophthalmology has been consulted.  -Day 2 of 3 Solumedrol 1g  -Tylenol for pain -General surgery recommendations -Ophthalmology recommendations -Daily CBC -Daily BMP -Tramadol for  pain. -DC heparin at 5 AM per surgery.  Uncontrolled T2DM: Last A1c 14.9% on admit.  Increase gross resistance likely due to receiving high-dose IV steroids. CBGs ranged from 280-360.  Required 90 units of aspart and received 30 units of Lantus.  Titrate Lantus this evening based on requirements of aspart.   - Resistant SSI  - Increase Lantus to 45 based  - Will monitor blood sugars closely  Microcytic Anemia: Hgb 11.8, MCV 71.2.  Hemoglobin stable at 12.4. Iron panel within normal limits. -Daily CBC  Paroxysmal A-fib: In sinus rhythm on admission.   Xarelto was stopped.  Patient was placed on heparin with a goal of INR 1.7 per surgery.  INR was 1.6 on 2/14. CHADS-VASc is a 6.  HTN: Initially hypertensive to 157/68 in the ED. - Continue home Norvasc 10mg  daily, Clonidine 0.2mg  bid, Lisinopril 40mg  daily, and Spironolactone 50mg  daily  CKD II: Cr 1.11>1.4 on admission (BL 0.9-1.1). - Avoid nephrotoxic agents as able -BMP  OSA: Stable. - CPAP nightly  FEN/GI: Heart healthy carb-modified diet Prophylaxis: On Xarelto for A-fib   Disposition: Patient pending surgery  Subjective:  Patient endorses continued right-sided headache.  Denies any worsening vision.  Denies any weakness or shortness of breath or chest pain.  Reports not sleeping well even with CPAP due to headache.  Headache is not getting worse.  Objective: Temp:  [98 F (36.7 C)-99.2 F (37.3 C)] 99.1 F (37.3 C) (02/14 0534) Pulse Rate:  [71-105] 71 (02/14 0534) Resp:  [16-20] 18 (02/14 0534) BP: (136-152)/(68-89) 149/68 (02/14 0534) SpO2:  [96 %-100 %] 98 % (02/14 0534) Physical Exam: General: No  acute distress, resting in bed HEENT: Extra ocular motions intact, PERRLA, eyes are nonicteric noninjected Cardiovascular: Regular rhythm, no murmurs rubs gallops Respiratory: Clear to auscultation laterally, normal work of breathing Abdomen: Soft nontender nondistended Extremities: 2+ DP, no lower extremity  edema  Laboratory: Recent Labs  Lab 09/15/17 1555 09/15/17 1615 09/16/17 0302 09/17/17 0258  WBC 8.5  --  8.4 25.7*  HGB 11.8* 13.3 11.0* 12.4  HCT 37.4 39.0 34.9* 37.7  PLT 308  --  308 348   Recent Labs  Lab 09/15/17 1555 09/15/17 1615 09/16/17 0302 09/17/17 0258  NA 136 139 138 135  K 4.2 4.7 4.1 4.3  CL 105 103 106 103  CO2 19*  --  22 19*  BUN 18 25* 16 25*  CREATININE 1.19* 1.00 1.11* 1.40*  CALCIUM 9.4  --  9.0 9.5  PROT 7.3  --   --   --   BILITOT 0.7  --   --   --   ALKPHOS 82  --   --   --   ALT 13*  --   --   --   AST 17  --   --   --   GLUCOSE 359* 361* 332* 366*    Bonnita Hollow, MD 09/17/2017, 7:13 AM PGY-1, [place] Intern pager: [phone], text pages welcome

## 2017-09-17 NOTE — Progress Notes (Addendum)
[facility] for Heparin Indication: atrial fibrillation  Allergies  Allergen Reactions  . Propoxyphene N-Acetaminophen Hives, Itching, Swelling and Other (See Comments)    REACTION: Hallucinations  . Glipizide Nausea And Vomiting  . Metformin And Related Diarrhea   Patient Measurements: Weight: 243 lb (110.2 kg)  Ideal Body Weight: 50 kg Heparin Dosing Weight: 76 kg  Vital Signs: Temp: 98.2 F (36.8 C) (02/14 1420) Temp Source: Oral (02/14 1420) BP: 130/60 (02/14 1420) Pulse Rate: 72 (02/14 1420)  Labs: Recent Labs    09/15/17 1555 09/15/17 1615 09/16/17 0302 09/16/17 0748 09/17/17 0226 09/17/17 0258 09/17/17 1215  HGB 11.8* 13.3 11.0*  --   --  12.4  --   HCT 37.4 39.0 34.9*  --   --  37.7  --   PLT 308  --  308  --   --  348  --   APTT 42*  --   --   --   --  34 96*  LABPROT 23.6*  --   --  34.5*  --  19.0*  --   INR 2.13  --   --  3.46  --  1.61  --   HEPARINUNFRC  --   --   --   --  >2.20*  --   --   CREATININE 1.19* 1.00 1.11*  --   --  1.40*  --    CrCl cannot be calculated (Unknown ideal weight.).  Medical History: Past Medical History:  Diagnosis Date  . Atrial fibrillation ([place])   . CVA (cerebral vascular accident) ([place])    left pontine and frontal lobe  . Diabetes mellitus    type 2  . Hypercholesteremia   . Hypertension   . Hypertension    Hypertensive urgency 05/2006  . TIA (transient ischemic attack) 2017    Medications:  Medications Prior to Admission  Medication Sig Dispense Refill Last Dose  . amLODipine (NORVASC) 10 MG tablet Take 1 tablet (10 mg total) by mouth daily. 90 tablet 3 09/15/2017 at Unknown time  . atorvastatin (LIPITOR) 80 MG tablet Take 1 tablet (80 mg total) by mouth daily at 6 PM. 90 tablet 3 09/15/2017 at Unknown time  . cloNIDine (CATAPRES) 0.2 MG tablet Take 1 tablet (0.2 mg total) by mouth 2 (two) times daily. 180 tablet 3 09/15/2017 at Unknown time  . fluticasone (FLONASE) 50  MCG/ACT nasal spray Place 2 sprays into both nostrils daily. 16 g 6 Past Month at Unknown time  . insulin aspart (NOVOLOG) 100 UNIT/ML injection Inject 20 units under the skin with breakfast and 30 units under the skin with lunch (Patient taking differently: Inject 33 Units into the skin 3 (three) times daily with meals. ) 10 mL 2 09/15/2017 at Unknown time  . liraglutide (VICTOZA) 18 MG/3ML SOPN Inject 0.3 mLs (1.8 mg total) into the skin daily. 6 mL 2 09/15/2017 at Unknown time  . lisinopril (PRINIVIL,ZESTRIL) 40 MG tablet TAKE 1 TABLET(40 MG) BY MOUTH DAILY 90 tablet 3 09/15/2017 at Unknown time  . meloxicam (MOBIC) 7.5 MG tablet Take 1 tablet (7.5 mg total) by mouth daily. 14 tablet 0 09/15/2017 at Unknown time  . rivaroxaban (XARELTO) 20 MG TABS tablet Take 1 tablet (20 mg total) by mouth daily with supper. 90 tablet 3 09/14/2017 at 2300  . spironolactone (ALDACTONE) 50 MG tablet Take 1 tablet (50 mg total) by mouth daily. 90 tablet 3 09/15/2017 at Unknown time   Assessment: 50 YOF who  presented to the ED with headache and blurred vision. On rivaroxaban at home for h/o Afib. Pharmacy consulted to start IV heparin until INR down to 1.7 for surgery. Last dose of rivaroxaban was 2/13 at 0322. CBC stable. H/H low. Plt wnl. BL aPTT was 42 on 2/12.  Heparin started ~0330 this morning; confirmatory aPTT level therapeutic: 96; will emperically decrease heparin level to prevent supra therpaeutic level. heparin infusion is to be stopped tomorrow morning at 0500 per Dr. Grandville Silos. No overt bleeding documented.  Goal of Therapy:  Heparin level 0.3-0.7 units/ml  APTT 66-102 s Monitor platelets by anticoagulation protocol: Yes   Plan:  -Reduce heparin to 1100 units/hr   -Ordered stat aPTT -Monitor daily Heparin Level, aPTT, CBC while on heparin -Monitor for s/sx of bleeding  Georga Bora, PharmD Clinical Pharmacist 09/17/2017 4:05 PM

## 2017-09-17 NOTE — Evaluation (Addendum)
Physical Therapy Evaluation Patient Details Name: [name] MRN: [id number] DOB: 1956/01/13 Today's Date: 09/17/2017   History of Present Illness  [name] is a 62 y.o. female presenting with right temporal headache and blurred vision of the right eye. PMH:  paroxysmal A-fib on xarelto, hx CVA x 3, uncontrolled DM, HTN, CKD, HLD, OSA.   MRI negative;  Plan is for temporal artery bx 09/18/17     Clinical Impression  Patient evaluated by Physical Therapy with no further acute PT needs identified. All education has been completed and the patient has no further questions. *See below for any follow-up Physical Therapy or equipment needs. PT is signing off. Thank you for this referral. Pt is mod I at her baseline, amb with SPC, uses rollator for community amb; pt is able to amb 160' with SPC, no LOB, steady gait with slightly decr velocity; pt feels she is at her baseline for mobility. She has been getting up to bathroom without difficulty; no further PT needs at this time    Follow Up Recommendations No PT follow up    Equipment Recommendations  None recommended by PT    Recommendations for Other Services       Precautions / Restrictions Restrictions Weight Bearing Restrictions: No      Mobility  Bed Mobility Overal bed mobility: Modified Independent             General bed mobility comments: HOB elevated  Transfers Overall transfer level: Modified independent Equipment used: None                Ambulation/Gait Ambulation/Gait assistance: Supervision;Modified independent (Device/Increase time) Ambulation Distance (Feet): 160 Feet Assistive device: Straight cane Gait Pattern/deviations: Step-through pattern Gait velocity: decr   General Gait Details: steady gait although guarded initially, no LOB using cane over level and smooth hallway surface (amb with cane at baseline)  Stairs            Wheelchair Mobility    Modified Rankin (Stroke Patients  Only)       Balance Overall balance assessment: Needs assistance(remote hx of 1 fall, when "getting up too fast") Sitting-balance support: Feet supported;No upper extremity supported;Feet unsupported Sitting balance-Leahy Scale: Normal       Standing balance-Leahy Scale: Good Standing balance comment: mod perturbations not provided             High level balance activites: Side stepping;Backward walking;Direction changes;Turns;Head turns High Level Balance Comments: no LOB with above             Pertinent Vitals/Pain Pain Assessment: 0-10 Pain Score: 4  Pain Location: right side head Pain Descriptors / Indicators: Aching Pain Intervention(s): Monitored during session    Home Living Family/patient expects to be discharged to:: Private residence   Available Help at Discharge: Family Type of Home: House Home Access: Stairs to enter   Technical brewer of Steps: 2 Home Layout: One level Home Equipment: Environmental consultant - 4 wheels;Cane - single point Additional Comments: 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"    Prior Function Level of Independence: Independent with assistive device(s)         Comments: amb with cane inside home; uses rollator for longer distances d/t back pain and fatigue with standing     Hand Dominance        Extremity/Trunk Assessment   Upper Extremity Assessment Upper Extremity Assessment: Overall WFL for tasks assessed  Lower Extremity Assessment Lower Extremity Assessment: Overall WFL for tasks assessed       Communication   Communication: No difficulties  Cognition Arousal/Alertness: Awake/alert Behavior During Therapy: WFL for tasks assessed/performed Overall Cognitive Status: Within Functional Limits for tasks assessed                                        General Comments      Exercises     Assessment/Plan    PT Assessment Patent does not need any further PT  services  PT Problem List         PT Treatment Interventions      PT Goals (Current goals can be found in the Care Plan section)  Acute Rehab PT Goals Patient Stated Goal: to feel better PT Goal Formulation: All assessment and education complete, DC therapy    Frequency     Barriers to discharge        Co-evaluation               AM-PAC PT "6 Clicks" Daily Activity  Outcome Measure Difficulty turning over in bed (including adjusting bedclothes, sheets and blankets)?: None Difficulty moving from lying on back to sitting on the side of the bed? : None Difficulty sitting down on and standing up from a chair with arms (e.g., wheelchair, bedside commode, etc,.)?: None Help needed moving to and from a bed to chair (including a wheelchair)?: None Help needed walking in hospital room?: None Help needed climbing 3-5 steps with a railing? : A Little 6 Click Score: 23    End of Session Equipment Utilized During Treatment: Gait belt Activity Tolerance: Patient tolerated treatment well Patient left: in bed;with call bell/phone within reach(alarm not on at arrival)   PT Visit Diagnosis: Difficulty in walking, not elsewhere classified (R26.2)    Time: 1010-1023 PT Time Calculation (min) (ACUTE ONLY): 13 min   Charges:   PT Evaluation $PT Eval Low Complexity: 1 Low     PT G CodesKenyon [name], PT Pager: [phone] 09/17/2017   [facility] 09/17/2017, 10:35 AM

## 2017-09-18 ENCOUNTER — Other Ambulatory Visit: Payer: Self-pay

## 2017-09-18 ENCOUNTER — Encounter (HOSPITAL_COMMUNITY)
Admission: EM | Disposition: A | Discharge status: Home / Self Care | Payer: Self-pay | Source: Home / Self Care | Attending: Family Medicine

## 2017-09-18 ENCOUNTER — Inpatient Hospital Stay (HOSPITAL_COMMUNITY): Payer: Medicare HMO | Admitting: Certified Registered"

## 2017-09-18 ENCOUNTER — Encounter (HOSPITAL_COMMUNITY): Payer: Self-pay | Admitting: General Practice

## 2017-09-18 DIAGNOSIS — I10 Essential (primary) hypertension: Secondary | ICD-10-CM

## 2017-09-18 HISTORY — PX: ARTERY BIOPSY: SHX891

## 2017-09-18 LAB — GLUCOSE, CAPILLARY
GLUCOSE-CAPILLARY: 288 mg/dL — AB (ref 65–99)
Glucose-Capillary: 285 mg/dL — ABNORMAL HIGH (ref 65–99)
Glucose-Capillary: 283 mg/dL — ABNORMAL HIGH (ref 65–99)
Glucose-Capillary: 287 mg/dL — ABNORMAL HIGH (ref 65–99)
Glucose-Capillary: 291 mg/dL — ABNORMAL HIGH (ref 65–99)
Glucose-Capillary: 345 mg/dL — ABNORMAL HIGH (ref 65–99)

## 2017-09-18 LAB — DIFFERENTIAL
BASOS ABS: 0 10*3/uL (ref 0.0–0.1)
Basophils Relative: 0 %
Eosinophils Absolute: 0.4 10*3/uL (ref 0.0–0.7)
Eosinophils Relative: 1 %
LYMPHS PCT: 6 %
Lymphs Abs: 1.7 10*3/uL (ref 0.7–4.0)
MONOS PCT: 3 %
Monocytes Absolute: 0.8 10*3/uL (ref 0.1–1.0)
NEUTROS PCT: 90 %
Neutro Abs: 27.7 10*3/uL — ABNORMAL HIGH (ref 1.7–7.7)

## 2017-09-18 LAB — CBC
HEMATOCRIT: 36.5 % (ref 36.0–46.0)
Hemoglobin: 11.8 g/dL — ABNORMAL LOW (ref 12.0–15.0)
MCH: 23 pg — ABNORMAL LOW (ref 26.0–34.0)
MCHC: 32.3 g/dL (ref 30.0–36.0)
MCV: 71.2 fL — ABNORMAL LOW (ref 78.0–100.0)
Platelets: 355 10*3/uL (ref 150–400)
RBC: 5.13 MIL/uL — ABNORMAL HIGH (ref 3.87–5.11)
RDW: 15 % (ref 11.5–15.5)
WBC: 30.1 10*3/uL — ABNORMAL HIGH (ref 4.0–10.5)

## 2017-09-18 LAB — BASIC METABOLIC PANEL
Anion gap: 11 (ref 5–15)
BUN: 32 mg/dL — ABNORMAL HIGH (ref 6–20)
CALCIUM: 9.6 mg/dL (ref 8.9–10.3)
CO2: 21 mmol/L — AB (ref 22–32)
CREATININE: 1.4 mg/dL — AB (ref 0.44–1.00)
Chloride: 104 mmol/L (ref 101–111)
GFR calc non Af Amer: 39 mL/min — ABNORMAL LOW (ref >60)
GFR, EST AFRICAN AMERICAN: 46 mL/min — AB (ref >60)
Glucose, Bld: 327 mg/dL — ABNORMAL HIGH (ref 65–99)
Potassium: 4.6 mmol/L (ref 3.5–5.1)
Sodium: 136 mmol/L (ref 135–145)

## 2017-09-18 LAB — MRSA PCR SCREENING: MRSA by PCR: NEGATIVE

## 2017-09-18 SURGERY — BIOPSY TEMPORAL ARTERY
Anesthesia: General | Site: Face | Laterality: Right

## 2017-09-18 MED ORDER — LIDOCAINE HCL (CARDIAC) 20 MG/ML IV SOLN
INTRAVENOUS | Status: DC | PRN
  Administered 2017-09-18: 100 mg via INTRATRACHEAL

## 2017-09-18 MED ORDER — MIDAZOLAM HCL 2 MG/2ML IJ SOLN
INTRAMUSCULAR | Status: AC
  Filled 2017-09-18: qty 2

## 2017-09-18 MED ORDER — ONDANSETRON HCL 4 MG/2ML IJ SOLN: 4.0000 mg | Freq: Once | INTRAMUSCULAR | Status: DC | PRN

## 2017-09-18 MED ORDER — PROPOFOL 10 MG/ML IV BOLUS
INTRAVENOUS | Status: AC
  Filled 2017-09-18: qty 20

## 2017-09-18 MED ORDER — CEFAZOLIN SODIUM-DEXTROSE 2-3 GM-%(50ML) IV SOLR
INTRAVENOUS | Status: DC | PRN
  Administered 2017-09-18: 2 g via INTRAVENOUS

## 2017-09-18 MED ORDER — RIVAROXABAN 15 MG PO TABS
15.0000 mg | ORAL_TABLET | Freq: Every day | ORAL | Status: DC
  Administered 2017-09-18: 15 mg via ORAL
  Filled 2017-09-18: qty 1

## 2017-09-18 MED ORDER — FENTANYL CITRATE (PF) 250 MCG/5ML IJ SOLN
INTRAMUSCULAR | Status: AC
  Filled 2017-09-18: qty 5

## 2017-09-18 MED ORDER — INSULIN ASPART 100 UNIT/ML ~~LOC~~ SOLN
SUBCUTANEOUS | Status: AC
  Administered 2017-09-18: 11 [IU] via SUBCUTANEOUS
  Filled 2017-09-18: qty 1

## 2017-09-18 MED ORDER — LACTATED RINGERS IV SOLN
INTRAVENOUS | Status: DC
  Administered 2017-09-18 (×2): via INTRAVENOUS

## 2017-09-18 MED ORDER — ONDANSETRON HCL 4 MG/2ML IJ SOLN
INTRAMUSCULAR | Status: DC | PRN
  Administered 2017-09-18: 4 mg via INTRAVENOUS

## 2017-09-18 MED ORDER — ONDANSETRON HCL 4 MG/2ML IJ SOLN
INTRAMUSCULAR | Status: AC
  Filled 2017-09-18: qty 2

## 2017-09-18 MED ORDER — FENTANYL CITRATE (PF) 100 MCG/2ML IJ SOLN: 25.0000 ug | INTRAMUSCULAR | Status: DC | PRN

## 2017-09-18 MED ORDER — BUPIVACAINE HCL (PF) 0.25 % IJ SOLN
INTRAMUSCULAR | Status: AC
Start: 2017-09-18 — End: ?
  Filled 2017-09-18: qty 30

## 2017-09-18 MED ORDER — INSULIN GLARGINE 100 UNIT/ML ~~LOC~~ SOLN
50.0000 [IU] | Freq: Every day | SUBCUTANEOUS | Status: DC
  Administered 2017-09-18: 50 [IU] via SUBCUTANEOUS
  Filled 2017-09-18: qty 0.5

## 2017-09-18 MED ORDER — INSULIN ASPART 100 UNIT/ML ~~LOC~~ SOLN
11.0000 [IU] | Freq: Once | SUBCUTANEOUS | Status: AC
  Administered 2017-09-18: 11 [IU] via SUBCUTANEOUS

## 2017-09-18 MED ORDER — CEFAZOLIN SODIUM-DEXTROSE 2-4 GM/100ML-% IV SOLN
INTRAVENOUS | Status: AC
  Filled 2017-09-18: qty 100

## 2017-09-18 MED ORDER — PROPOFOL 10 MG/ML IV BOLUS
INTRAVENOUS | Status: DC | PRN
  Administered 2017-09-18: 150 mg via INTRAVENOUS

## 2017-09-18 MED ORDER — MIDAZOLAM HCL 5 MG/5ML IJ SOLN
INTRAMUSCULAR | Status: DC | PRN
  Administered 2017-09-18: 2 mg via INTRAVENOUS

## 2017-09-18 MED ORDER — BUPIVACAINE HCL (PF) 0.25 % IJ SOLN
INTRAMUSCULAR | Status: DC | PRN
  Administered 2017-09-18: 2 mL

## 2017-09-18 SURGICAL SUPPLY — 38 items
ADH SKN CLS APL DERMABOND .7 (GAUZE/BANDAGES/DRESSINGS) ×1
BALL CTTN LRG ABS STRL LF (GAUZE/BANDAGES/DRESSINGS) ×1
CHLORAPREP W/TINT 10.5 ML (MISCELLANEOUS) ×3 IMPLANT
COTTONBALL LRG STERILE PKG (GAUZE/BANDAGES/DRESSINGS) ×3 IMPLANT
COVER SURGICAL LIGHT HANDLE (MISCELLANEOUS) ×3 IMPLANT
CRADLE DONUT ADULT HEAD (MISCELLANEOUS) ×3 IMPLANT
DERMABOND ADVANCED (GAUZE/BANDAGES/DRESSINGS) ×2
DERMABOND ADVANCED .7 DNX12 (GAUZE/BANDAGES/DRESSINGS) ×1 IMPLANT
DRAPE LAPAROTOMY 100X72 PEDS (DRAPES) ×3 IMPLANT
DRAPE UTILITY XL STRL (DRAPES) ×6 IMPLANT
DRSG TELFA 3X8 NADH (GAUZE/BANDAGES/DRESSINGS) ×3 IMPLANT
ELECT CAUTERY BLADE 6.4 (BLADE) ×3 IMPLANT
ELECT REM PT RETURN 9FT ADLT (ELECTROSURGICAL) ×3
ELECTRODE REM PT RTRN 9FT ADLT (ELECTROSURGICAL) ×1 IMPLANT
GAUZE SPONGE 4X4 16PLY XRAY LF (GAUZE/BANDAGES/DRESSINGS) ×3 IMPLANT
GEL ULTRASOUND 20GR AQUASONIC (MISCELLANEOUS) IMPLANT
GLOVE SURG SIGNA 7.5 PF LTX (GLOVE) ×3 IMPLANT
GOWN STRL REUS W/ TWL LRG LVL3 (GOWN DISPOSABLE) ×1 IMPLANT
GOWN STRL REUS W/ TWL XL LVL3 (GOWN DISPOSABLE) ×1 IMPLANT
GOWN STRL REUS W/TWL LRG LVL3 (GOWN DISPOSABLE) ×6
GOWN STRL REUS W/TWL XL LVL3 (GOWN DISPOSABLE) ×3
KIT BASIN OR (CUSTOM PROCEDURE TRAY) ×3 IMPLANT
KIT ROOM TURNOVER OR (KITS) ×3 IMPLANT
NDL HYPO 25GX1X1/2 BEV (NEEDLE) ×1 IMPLANT
NEEDLE HYPO 25GX1X1/2 BEV (NEEDLE) ×3 IMPLANT
NS IRRIG 1000ML POUR BTL (IV SOLUTION) ×3 IMPLANT
PACK SURGICAL SETUP 50X90 (CUSTOM PROCEDURE TRAY) ×3 IMPLANT
PAD ARMBOARD 7.5X6 YLW CONV (MISCELLANEOUS) ×3 IMPLANT
PAD DRESSING TELFA 3X8 NADH (GAUZE/BANDAGES/DRESSINGS) ×1 IMPLANT
PENCIL BUTTON HOLSTER BLD 10FT (ELECTRODE) ×3 IMPLANT
SPECIMEN JAR SMALL (MISCELLANEOUS) ×3 IMPLANT
SUT MNCRL AB 4-0 PS2 18 (SUTURE) ×3 IMPLANT
SUT SILK 3 0 TIES 17X18 (SUTURE) ×3
SUT SILK 3-0 18XBRD TIE BLK (SUTURE) ×1 IMPLANT
SUT VIC AB 3-0 SH 27 (SUTURE) ×3
SUT VIC AB 3-0 SH 27XBRD (SUTURE) ×1 IMPLANT
SYR CONTROL 10ML LL (SYRINGE) ×3 IMPLANT
TOWEL OR 17X26 10 PK STRL BLUE (TOWEL DISPOSABLE) ×3 IMPLANT

## 2017-09-18 NOTE — Progress Notes (Signed)
FPTS Interim Progress Note  S: Patient is now back from procedure.  She is complaining of right-sided headache around the incision and behind her right eye.  Pain is 9 out of 10.  She initially was refusing Tylenol, convinced her to try the Tylenol before taking the tramadol.  I explained to her that these work together for synergistic analgesia.  Denies any chest pain, shortness of breath, abdominal pain.  O: BP 129/69 (BP Location: Left Arm)   Pulse 60   Temp 97.6 F (36.4 C) (Oral)   Resp 18   Wt 243 lb (110.2 kg)   SpO2 92%   BMI 44.45 kg/m   General: Resting in bed, no acute distress HEENT: Incision along right side of head along the temporal region.  Incision is clean dry and intact.  The area is tender to palpation.  PERRLA, EOMI  CVS: Regular rhythm no murmurs rubs or gallops Lungs: Clear to auscultation bilaterally, normal work of breathing MSK: SCDs in place, moves extremities spontaneously,  Neuro: Alert and oriented x3  A/P: Please see today's H&P  Bonnita Hollow, MD 09/18/2017, 11:38 AM PGY-1, [place] [facility] Service pager [phone]

## 2017-09-18 NOTE — Anesthesia Procedure Notes (Signed)
Procedure Name: LMA Insertion Date/Time: 09/18/2017 9:07 AM Performed by: Gwyndolyn Saxon, CRNA Pre-anesthesia Checklist: Patient identified, Emergency Drugs available, Suction available, Patient being monitored and Timeout performed Patient Re-evaluated:Patient Re-evaluated prior to induction Oxygen Delivery Method: Circle system utilized Preoxygenation: Pre-oxygenation with 100% oxygen Induction Type: IV induction Ventilation: Mask ventilation without difficulty and Oral airway inserted - appropriate to patient size LMA: LMA inserted LMA Size: 4.0 Number of attempts: 1 Placement Confirmation: positive ETCO2,  CO2 detector and breath sounds checked- equal and bilateral Tube secured with: Tape Dental Injury: Teeth and Oropharynx as per pre-operative assessment

## 2017-09-18 NOTE — Transfer of Care (Signed)
Immediate Anesthesia Transfer of Care Note  Patient: [name]  Procedure(s) Performed: BIOPSY OF RIGHT TEMPORAL ARTERY (Right Face)  Patient Location: PACU  Anesthesia Type:General  Level of Consciousness: sedated  Airway & Oxygen Therapy: Patient Spontanous Breathing and Patient connected to face mask oxygen  Post-op Assessment: Report given to RN and Post -op Vital signs reviewed and stable  Post vital signs: Reviewed and stable  Last Vitals:  Vitals:   09/18/17 0538 09/18/17 0940  BP: (!) 149/73 123/71  Pulse: (!) 59 64  Resp: 18 12  Temp: 36.7 C (!) 36.1 C  SpO2: 98% 100%    Last Pain:  Vitals:   09/18/17 0940  TempSrc:   PainSc: Asleep      Patients Stated Pain Goal: 3 (97/67/34 1937)  Complications: No apparent anesthesia complications

## 2017-09-18 NOTE — Progress Notes (Signed)
ANTICOAGULATION CONSULT NOTE - Initial Consult  Pharmacy Consult for Xarelto Indication: atrial fibrillation  Allergies  Allergen Reactions  . Propoxyphene N-Acetaminophen Hives, Itching, Swelling and Other (See Comments)    REACTION: Hallucinations  . Glipizide Nausea And Vomiting  . Metformin And Related Diarrhea   Patient Measurements: Weight: 243 lb (110.2 kg)  Vital Signs: Temp: 97.6 F (36.4 C) (02/15 1051) Temp Source: Oral (02/15 1051) BP: 129/69 (02/15 1051) Pulse Rate: 60 (02/15 1051)  Labs: Recent Labs    09/15/17 1555  09/16/17 0302 09/16/17 0748 09/17/17 0226 09/17/17 0258 09/17/17 1215 09/18/17 0226  HGB 11.8*   < > 11.0*  --   --  12.4  --  11.8*  HCT 37.4   < > 34.9*  --   --  37.7  --  36.5  PLT 308  --  308  --   --  348  --  355  APTT 42*  --   --   --   --  34 96*  --   LABPROT 23.6*  --   --  34.5*  --  19.0*  --   --   INR 2.13  --   --  3.46  --  1.61  --   --   HEPARINUNFRC  --   --   --   --  >2.20*  --   --   --   CREATININE 1.19*   < > 1.11*  --   --  1.40*  --  1.40*   < > = values in this interval not displayed.   CrCl cannot be calculated (Unknown ideal weight.).  Medical History: Past Medical History:  Diagnosis Date  . Atrial fibrillation ([place])   . CVA (cerebral vascular accident) ([place])    left pontine and frontal lobe  . Diabetes mellitus    type 2  . Hypercholesteremia   . Hypertension   . Hypertension    Hypertensive urgency 05/2006  . TIA (transient ischemic attack) 2017    Assessment: 61yoF  with atrial fibrillation on Xarelto PTA transitioned to heparin for procedure, now transitioning back to Xarelto post procedure. Surgery has asked that anticoagulation restart tonight at 2000. HgB low but stable. No overt bleeding reported.   Of note patients adjusted CrCl ~70mL/min. sCr up from baseline, will continue to monitor renal function. Pharmacy will reduce the dose to help prevent adverse bleeding.   Goal of Therapy:   Monitor platelets by anticoagulation protocol: Yes   Plan:  Xarelto 15mg  PO QD  Monitor renal function and for s/sx of bleeding  Haston Casebolt L Javanna Patin 09/18/2017,1:20 PM

## 2017-09-18 NOTE — Progress Notes (Signed)
OT Cancellation    09/18/17 1045  OT Visit Information  Last OT Received On 09/18/17  Reason Eval/Treat Not Completed Patient at procedure or test/ unavailable  [facility], OT/L  [phone] 09/18/2017

## 2017-09-18 NOTE — Op Note (Signed)
BIOPSY OF RIGHT TEMPORAL ARTERY  Procedure Note  [name] 09/18/2017   Pre-op Diagnosis: HEADACHE RULE OUT ARTERITIS     Post-op Diagnosis: same  Procedure(s): BIOPSY OF RIGHT TEMPORAL ARTERY  Surgeon(s): Coralie Keens, MD  Anesthesia: General  Staff:  Circulator: Cyd Silence, RN Scrub Person: Caswell Corwin T  Estimated Blood Loss: Minimal               Specimens: sent to path   Procedure: The patient was brought to the operating room and identified as the correct patient.  She is placed upon the operating table and general anesthesia was induced.  Her right temple area was then prepped and draped in usual sterile fashion.  I anesthetized the skin over the palpable temporal artery with Marcaine.  I then made an incision with [name] scalpel.  I took this down through the fascial layer with the electrocautery.  I was then able to easily identify the right temporal artery.  I clamped it proximally and distally and then transected it with [name] scalpel.  [name] section of the artery was then sent to pathology for evaluation.  I tied off the proximal distal ends with 3-0 silk sutures.  Hemostasis appeared to be achieved.  I then closed the fascial layer with interrupted 3-0 Vicryl sutures and closed the skin with [name] running 4-0 Monocryl.  Dermabond was then applied.  The patient tolerated the procedure well.  All the counts were correct at the end of the procedure.  The patient was then extubated in the operating room and taken in [name] stable condition to the recovery room.          [name]   Date: 09/18/2017  Time: 9:33 AM

## 2017-09-18 NOTE — Anesthesia Postprocedure Evaluation (Signed)
Anesthesia Post Note  Patient: [name]  Procedure(s) Performed: BIOPSY OF RIGHT TEMPORAL ARTERY (Right Face)     Patient location during evaluation: PACU Anesthesia Type: General Level of consciousness: awake and alert Pain management: pain level controlled Vital Signs Assessment: post-procedure vital signs reviewed and stable Respiratory status: spontaneous breathing, nonlabored ventilation and respiratory function stable Cardiovascular status: blood pressure returned to baseline and stable Postop Assessment: no apparent nausea or vomiting Anesthetic complications: no    Last Vitals:  Vitals:   09/18/17 1035 09/18/17 1051  BP: 131/75 129/69  Pulse: 60 60  Resp: 18 18  Temp: 36.4 C 36.4 C  SpO2: 95% 92%    Last Pain:  Vitals:   09/18/17 1051  TempSrc: Oral  PainSc: [place]

## 2017-09-18 NOTE — Progress Notes (Signed)
Spoke with Dr. Ninfa Linden regarding patient's anticoagulation.  General surgery said that Xarelto could be started within 8 hours of procedure.  Will restart Xarelto this evening at 8 PM

## 2017-09-18 NOTE — Progress Notes (Signed)
Patient ID: [name], female   DOB: 10/05/1955, 62 y.o.   MRN: [id number] Pre Procedure note for inpatients:   [name] has been scheduled for Procedure(s): BIOPSY OF RIGHT TEMPORAL ARTERY (Right) today. The various methods of treatment have been discussed with the patient. After consideration of the risks, benefits and treatment options the patient has consented to the planned procedure.   The patient has been seen and labs reviewed. There are no changes in the patient's condition to prevent proceeding with the planned procedure today.  Recent labs:  Lab Results  Component Value Date   WBC 30.1 (H) 09/18/2017   HGB 11.8 (L) 09/18/2017   HCT 36.5 09/18/2017   PLT 355 09/18/2017   GLUCOSE 327 (H) 09/18/2017   CHOL 85 09/16/2017   TRIG 82 09/16/2017   HDL 26 (L) 09/16/2017   LDLDIRECT 51 03/27/2016   LDLCALC 43 09/16/2017   ALT 13 (L) 09/15/2017   AST 17 09/15/2017   NA 136 09/18/2017   K 4.6 09/18/2017   CL 104 09/18/2017   CREATININE 1.40 (H) 09/18/2017   BUN 32 (H) 09/18/2017   CO2 21 (L) 09/18/2017   TSH 1.862 02/17/2016   INR 1.61 09/17/2017   HGBA1C 14.9 (H) 09/16/2017    Laren Whaling A, MD 09/18/2017 8:26 AM

## 2017-09-18 NOTE — Progress Notes (Signed)
Pt back from procedure, vital signs are stable. Pt complaining of 9/10 pain from headache but doesn't want any medication. Will continue to monitor.

## 2017-09-18 NOTE — Progress Notes (Signed)
Family Medicine Teaching Service Daily Progress Note Intern Pager: [phone]  Patient name: [name] Medical record number: [id number] Date of birth: 01/03/1956 Age: 62 y.o. Gender: female  Primary Care Provider: Verner Mould, MD Consultants: Surgery, ophthalmology Code Status: Full  Pt Overview and Major Events to Date:  [name] is a 62 y.o. female presenting with right temporal headache and blurred vision of the right eye. PMH is significant for paroxysmal A-fib on xarelto, hx CVA x 3, uncontrolled T2DM, HTN, CKD, HLD, OSA.  Assessment and Plan:  Right sided headache / blurred vision: Patient away at temporal artery biopsy.   Headache history more consistent with history of migraine.  Will continue steroid treatment to complete 3-day course of IV Solu-Medrol.  Leukocytosis 25 > 30 with a left shift.  Which is likely likely due to her steroids.   -Restart Xarelto after surgery for anticoagulation 24 to 48 hours after surgery. -Day 3 of 3 Solumedrol 1g  -Tylenol and tramadol for pain -General surgery recommendations -Ophthalmology recommendations -Daily CBC, differential pending -Daily BMP  Uncontrolled T2DM: Last A1c 14.9% on admit.  Increase gross resistance likely due to receiving high-dose IV steroids. CBGs ranged from 280-450.  Required 49 units of aspart and received 45 units of Lantus.    - Resistant SSI  - Increase Lantus to 50 based  - Will monitor blood sugars closely  Paroxysmal A-fib: In sinus rhythm on admission. CHADS-VASc is a 6. Xarelto was stopped for surgery.  -Restart anticoagulation 24-48 status post procedure -Appreciate surgery recommendations  HTN: Chronic - Continue home Norvasc 10mg  daily, Clonidine 0.2mg  bid, Lisinopril 40mg  daily, and Spironolactone 50mg  daily -Vital signs per floor  CKD II: Cr 1.11>1.4 on admission (BL 0.9-1.1). -BMP  OSA: Stable. - CPAP nightly  FEN/GI: Heart healthy carb-modified  diet Prophylaxis:  No anticoagulation, restart Xarelto within 24-48 hours  Disposition: Inpatient hospitalization  Subjective:  Patient way at surgery.  We will have team member evaluate later today.  Objective: Temp:  [97.7 F (36.5 C)-98.4 F (36.9 C)] 98 F (36.7 C) (02/15 0538) Pulse Rate:  [53-78] 59 (02/15 0538) Resp:  [16-18] 18 (02/15 0538) BP: (122-149)/(60-74) 149/73 (02/15 0538) SpO2:  [95 %-100 %] 98 % (02/15 0538) Physical Exam:   Laboratory: Recent Labs  Lab 09/16/17 0302 09/17/17 0258 09/18/17 0226  WBC 8.4 25.7* 30.1*  HGB 11.0* 12.4 11.8*  HCT 34.9* 37.7 36.5  PLT 308 348 355   Recent Labs  Lab 09/15/17 1555  09/16/17 0302 09/17/17 0258 09/18/17 0226  NA 136   < > 138 135 136  K 4.2   < > 4.1 4.3 4.6  CL 105   < > 106 103 104  CO2 19*  --  22 19* 21*  BUN 18   < > 16 25* 32*  CREATININE 1.19*   < > 1.11* 1.40* 1.40*  CALCIUM 9.4  --  9.0 9.5 9.6  PROT 7.3  --   --   --   --   BILITOT 0.7  --   --   --   --   ALKPHOS 82  --   --   --   --   ALT 13*  --   --   --   --   AST 17  --   --   --   --   GLUCOSE 359*   < > 332* 366* 327*   < > = values in this interval not displayed.  Bonnita Hollow, MD 09/18/2017, 8:50 AM PGY-1, [place] Intern pager: [phone], text pages welcome

## 2017-09-18 NOTE — Anesthesia Preprocedure Evaluation (Addendum)
Anesthesia Evaluation  Patient identified by MRN, date of birth, ID band Patient awake    Reviewed: Allergy & Precautions, NPO status , Patient's Chart, lab work & pertinent test results  Airway Mallampati: III  TM Distance: >3 FB Neck ROM: Full    Dental  (+) Dental Advisory Given, Edentulous Upper, Missing   Pulmonary sleep apnea ,    Pulmonary exam normal breath sounds clear to auscultation       Cardiovascular hypertension, Pt. on medications + Peripheral Vascular Disease  Normal cardiovascular exam+ dysrhythmias (PAF) Atrial Fibrillation  Rhythm:Regular Rate:Normal     Neuro/Psych  Headaches, PSYCHIATRIC DISORDERS Depression TIACVA    GI/Hepatic negative GI ROS, Neg liver ROS,   Endo/Other  diabetes, Poorly Controlled, Type 2, Insulin DependentMorbid obesity  Renal/GU Renal InsufficiencyRenal disease     Musculoskeletal negative musculoskeletal ROS (+)   Abdominal   Peds  Hematology  (+) Blood dyscrasia (Xarelto), anemia ,   Anesthesia Other Findings Day of surgery medications reviewed with the patient.  Reproductive/Obstetrics                            Anesthesia Physical Anesthesia Plan  ASA: III  Anesthesia Plan: General   Post-op Pain Management:    Induction: Intravenous  PONV Risk Score and Plan: 3 and Ondansetron, Treatment may vary due to age or medical condition and Midazolam  Airway Management Planned: LMA  Additional Equipment:   Intra-op Plan:   Post-operative Plan: Extubation in OR  Informed Consent: I have reviewed the patients History and Physical, chart, labs and discussed the procedure including the risks, benefits and alternatives for the proposed anesthesia with the patient or authorized representative who has indicated his/her understanding and acceptance.   Dental advisory given  Plan Discussed with: CRNA  Anesthesia Plan Comments:          Anesthesia Quick Evaluation

## 2017-09-18 NOTE — Progress Notes (Signed)
Inpatient Diabetes Program Recommendations  AACE/ADA: New Consensus Statement on Inpatient Glycemic Control (2015)  Target Ranges:  Prepandial:   less than 140 mg/dL      Peak postprandial:   less than 180 mg/dL (1-2 hours)      Critically ill patients:  140 - 180 mg/dL   Lab Results  Component Value Date   GLUCAP 285 (H) 09/18/2017   HGBA1C 14.9 (H) 09/16/2017   Review of Glycemic Control  Diabetes history: DM 2 Outpatient Diabetes medications: Novolog 33 units tid, Victoza 0.3 mg Daily Current orders for Inpatient glycemic control: Lantus 45 units, Novolog Resistant Correction scale 0-20 units tid + Novolog HS scale 0-5 units  Inpatient Diabetes Program Recommendations:    Fasting glucose 285 mg/dl this am. Consider increasing Lantus to 52 units.  Thanks,  Tama Headings RN, MSN, [facility] Inpatient Diabetes Coordinator Team Pager [phone] (8a-5p)

## 2017-09-18 NOTE — Progress Notes (Signed)
Patient ID: [name], female   DOB: 1956-02-01, 62 y.o.   MRN: [id number]   Postop check:  She is doing well Incision looks good Ok to restart heparin this evening  She may shower and shampoo starting tomorrow  No post op follow-up with surgery needed.

## 2017-09-19 ENCOUNTER — Encounter (HOSPITAL_COMMUNITY): Payer: Self-pay | Admitting: Surgery

## 2017-09-19 LAB — CBC WITH DIFFERENTIAL/PLATELET
BASOS ABS: 0 10*3/uL (ref 0.0–0.1)
Basophils Relative: 0 %
Eosinophils Absolute: 0 10*3/uL (ref 0.0–0.7)
Eosinophils Relative: 0 %
HCT: 35.4 % — ABNORMAL LOW (ref 36.0–46.0)
Hemoglobin: 11.2 g/dL — ABNORMAL LOW (ref 12.0–15.0)
Lymphocytes Relative: 5 %
Lymphs Abs: 1.2 10*3/uL (ref 0.7–4.0)
MCH: 22.5 pg — ABNORMAL LOW (ref 26.0–34.0)
MCHC: 31.6 g/dL (ref 30.0–36.0)
MCV: 71.2 fL — ABNORMAL LOW (ref 78.0–100.0)
MONO ABS: 0.5 10*3/uL (ref 0.1–1.0)
Monocytes Relative: 2 %
Neutro Abs: 22.1 10*3/uL — ABNORMAL HIGH (ref 1.7–7.7)
Neutrophils Relative %: 93 %
PLATELETS: 341 10*3/uL (ref 150–400)
RBC: 4.97 MIL/uL (ref 3.87–5.11)
RDW: 15.2 % (ref 11.5–15.5)
WBC: 23.8 10*3/uL — AB (ref 4.0–10.5)

## 2017-09-19 LAB — BASIC METABOLIC PANEL
Anion gap: 10 (ref 5–15)
BUN: 34 mg/dL — ABNORMAL HIGH (ref 6–20)
CO2: 22 mmol/L (ref 22–32)
Calcium: 8.9 mg/dL (ref 8.9–10.3)
Chloride: 103 mmol/L (ref 101–111)
Creatinine, Ser: 1.25 mg/dL — ABNORMAL HIGH (ref 0.44–1.00)
GFR calc Af Amer: 52 mL/min — ABNORMAL LOW (ref >60)
GFR, EST NON AFRICAN AMERICAN: 45 mL/min — AB (ref >60)
GLUCOSE: 310 mg/dL — AB (ref 65–99)
Potassium: 4.9 mmol/L (ref 3.5–5.1)
SODIUM: 135 mmol/L (ref 135–145)

## 2017-09-19 LAB — GLUCOSE, CAPILLARY
GLUCOSE-CAPILLARY: 255 mg/dL — AB (ref 65–99)
GLUCOSE-CAPILLARY: 324 mg/dL — AB (ref 65–99)

## 2017-09-19 MED ORDER — PREDNISONE 20 MG PO TABS
60.0000 mg | ORAL_TABLET | Freq: Every day | ORAL | Status: DC
  Administered 2017-09-19: 60 mg via ORAL
  Filled 2017-09-19: qty 3

## 2017-09-19 MED ORDER — TRAMADOL HCL 50 MG PO TABS: 50.0000 mg | ORAL_TABLET | Freq: Four times a day (QID) | ORAL | 0 refills | Status: DC | PRN

## 2017-09-19 MED ORDER — ACETAMINOPHEN 325 MG PO TABS: 650.0000 mg | ORAL_TABLET | Freq: Four times a day (QID) | ORAL | 0 refills | Status: DC | PRN

## 2017-09-19 MED ORDER — PREDNISONE 10 MG PO TABS: ORAL_TABLET | ORAL | 0 refills | Status: DC

## 2017-09-19 MED ORDER — INSULIN GLARGINE 100 UNIT/ML ~~LOC~~ SOLN: 55.0000 [IU] | Freq: Every day | SUBCUTANEOUS | 11 refills | Status: DC

## 2017-09-19 MED ORDER — INSULIN GLARGINE 100 UNIT/ML ~~LOC~~ SOLN
55.0000 [IU] | Freq: Every day | SUBCUTANEOUS | Status: DC
  Filled 2017-09-19: qty 0.55

## 2017-09-19 NOTE — Discharge Instructions (Signed)
It has been a pleasure taking care of you! You were admitted due to headache. We have treated you with steroids and pain medicine. With that your symptoms improved to the point we think it is safe to let you go home and follow up with your primary care doctor.   We are discharging you on prednisone that you need to continue taking after you leave the hospital. You will need to taper very slowly off this medication unless otherwise told by your primary care doctor. The taper is as below: - prednisone 60mg  daily for 2 weeks - then 50mg  daily for 2 weeks - then 40mg  daily for 2 weeks - then 30mg  daily for 2 weeks - then 20mg  daily for 2 weeks - then 10mg  a day for 2 weeks. Your primary care doctor will instruct you on this process at your hospital follow up appointment.    Being on steroids means that your blood sugar must be followed very closely and you may need more insulin than usual. Please make sure you discuss this at your hospital follow up.  Since the eye doctor was not able to see you in the hospital, please ask your primary care doctor about seeing one as an outpatient. You will need to see an ophthalmologist eye doctor not an optometrist.   For your headaches, you were given a one time prescription for tramadol pain medicine on discharge. Your primary care doctor will discuss this with you about whether it is appropriate to continue this after you have left the hospital.  There could be some changes made to your home medications during this hospitalization. Please, make sure to read the directions before you take them. The names and directions on how to take these medications are found on this discharge paper under medication section.  Please follow up at your primary care doctor's office, you will see a different doctor on the same team. The address, date and time are found on the discharge paper under follow up section.      Information on my medicine - XARELTO  (Rivaroxaban)  This medication education was reviewed with me or my healthcare representative as part of my discharge preparation.    Why was Xarelto prescribed for you? Xarelto was prescribed for you to reduce the risk of a blood clot forming that can cause a stroke if you have a medical condition called atrial fibrillation (a type of irregular heartbeat).  What do you need to know about xarelto ? Take your Xarelto ONCE DAILY at the same time every day with your evening meal. If you have difficulty swallowing the tablet whole, you may crush it and mix in applesauce just prior to taking your dose.  Take Xarelto exactly as prescribed by your doctor and DO NOT stop taking Xarelto without talking to the doctor who prescribed the medication.  Stopping without other stroke prevention medication to take the place of Xarelto may increase your risk of developing a clot that causes a stroke.  Refill your prescription before you run out.  After discharge, you should have regular check-up appointments with your healthcare provider that is prescribing your Xarelto.  In the future your dose may need to be changed if your kidney function or weight changes by a significant amount.  What do you do if you miss a dose? If you are taking Xarelto ONCE DAILY and you miss a dose, take it as soon as you remember on the same day then continue your regularly scheduled once  daily regimen the next day. Do not take two doses of Xarelto at the same time or on the same day.   Important Safety Information A possible side effect of Xarelto is bleeding. You should call your healthcare provider right away if you experience any of the following: ? Bleeding from an injury or your nose that does not stop. ? Unusual colored urine (red or dark brown) or unusual colored stools (red or black). ? Unusual bruising for unknown reasons. ? A serious fall or if you hit your head (even if there is no bleeding).  Some medicines  may interact with Xarelto and might increase your risk of bleeding while on Xarelto. To help avoid this, consult your healthcare provider or pharmacist prior to using any new prescription or non-prescription medications, including herbals, vitamins, non-steroidal anti-inflammatory drugs (NSAIDs) and supplements.    Temporal Artery Biopsy, Care After Refer to this sheet in the next few weeks. These instructions provide you with information about caring for yourself after your procedure. Your health care provider may also give you more specific instructions. Your treatment has been planned according to current medical practices, but problems sometimes occur. Call your health care provider if you have any problems or questions after your procedure. What can I expect after the procedure? After the procedure, it is common to have:  Soreness.  Bruising.  Numbness.  Swelling.  Follow these instructions at home: Incision care  Follow instructions from your health care provider about how to take care of the cut made during surgery (incision). Make sure you: ? Wash your hands with soap and water before you change your bandage (dressing). If soap and water are not available, use hand sanitizer. ? Change your dressing as told by your health care provider. ? Leave stitches (sutures), skin glue, or adhesive strips in place. These skin closures may need to stay in place for 2 weeks or longer. If adhesive strip edges start to loosen and curl up, you may trim the loose edges. Do not remove adhesive strips completely unless your health care provider tells you to do that.  Check your incision area every day for signs of infection. Watch for: ? More redness, swelling, or pain. ? More fluid or blood. ? Warmth. ? Pus or a bad smell. Activity  Do not do any physical work or strenuous exercise until your health care provider approves.  Do not lift anything that is heavier than 10 lb (4.5 kg). General  instructions   Take over-the-counter and prescription medicines only as told by your health care provider. Do not take aspirin or other NSAIDs unless told by your health care provider. Aspirin may increase your risk of bleeding at the incision site.  Follow your health care provider's instructions on bathing or showering.  Keep all follow-up visits as told by your health care provider. This is important. Contact a health care provider if:  Medicine does not help your pain.  You have a fever or chills.  You have more redness, swelling, or pain around your incision site.  You have more fluid or blood coming from your incision site.  Your incision feels warm to the touch.  You have pus or a bad smell coming from your incision site.  You have a fever.  You develop nausea or vomiting. Get help right away if:  You have bleeding from the incision that does not stop after 30 minutes of applying heavy pressure.  You have chest pain.  You have shortness of  breath.  You faint.  You have sudden vision loss.  You develop weakness or drooping in your face or eye. This information is not intended to replace advice given to you by your health care provider. Make sure you discuss any questions you have with your health care provider. Document Released: 08/17/2015 Document Revised: 03/20/2016 Document Reviewed: 01/08/2015 Elsevier Interactive Patient Education  2018 [employer].   This website has more information on Xarelto: [email or web address]

## 2017-09-19 NOTE — Progress Notes (Signed)
Family Medicine Teaching Service Daily Progress Note Intern Pager: [phone]  Patient name: [name] Medical record number: [id number] Date of birth: Apr 22, 1956 Age: 62 y.o. Gender: female  Primary Care Provider: Verner Mould, MD Consultants: Surgery, ophthalmology Code Status: Full  Pt Overview and Major Events to Date:  [name] is a 62 y.o. female who presented with right temporal headache and blurred vision of the right eye. PMH is significant for paroxysmal A-fib on xarelto, hx CVA x 3, uncontrolled T2DM, HTN, CKD, HLD, OSA.  Assessment and Plan: Headache with blurred vision, improved.  Initially concerning for giant cell arteritis now more consistent with chronic migraines. S/p temporal artery biopsy and completed solumedrol x3d.  Leukocytosis improving 8.4 > peak 30.1> now 23.8 likely from steroids. -s/p 3 d of Solumedrol 1g (2/13-2/15) -start prednisone 60mg  x 2 weeks with taper as outpatient  -Tylenol and tramadol prn for pain -General surgery: no post op follow up needed -Ophthalmology has unfortunately not seen patient, will need outpatient follow up  Uncontrolled T2DM: Last A1c 14.9% on admit. With persistent hyperglycemia from steroids. CBGs ranged from 255-345.  Required 59 units of aspart in addition to 50U lantus in last 24 h, anticipate decreased insulin requirement now that transitioned off solumedrol to prednisone   - Resistant SSI  - Lantus increased from 50 to 55U tonight - monitor CBGs  Paroxysmal A-fib: Stable. HR 71. CHADS-VASc 6.  -Xarelto resumed post surgery   HTN: Chronic, stable.  - Continue home Norvasc 10mg  qd, Clonidine 0.2mg  bid, Lisinopril 40mg  qd, and Spironolactone 50mg  qd  CKD II: Stable, improved. Cr peak 1.4 > now 1.2 (BL 0.9-1.1). -will recheck as outpatient  OSA: Stable. - CPAP nightly  FEN/GI: carb-modified diet Prophylaxis:  Xarelto   Disposition: discharge home today with close outpatient follow up  scheduled for 09/22/17.  Subjective:  Patient states her headache is much improved this morning. She took a tramadol last night which helped. She states she still has blurry vision in her R eye that has been going for at least a month to several months but that this also has improved somewhat since admission. She otherwise feels well. States that has not yet seen an eye doctor in the hospital and is agreeable to outpatient follow up.  Objective: Temp:  [97 F (36.1 C)-98.3 F (36.8 C)] 97.9 F (36.6 C) (02/16 0523) Pulse Rate:  [52-80] 52 (02/16 0523) Resp:  [12-20] 18 (02/16 0523) BP: (111-150)/(60-78) 150/77 (02/16 0523) SpO2:  [92 %-100 %] 98 % (02/16 0523)   Physical Exam: General: sittting up in bed comfortably, in NAD HEENT: [place]. EOMI, PERRL. Peripheral fields intact. Able to read sign across room with L eye, unable to read sign with R eye but can see the sign. R temporal incision clean and dry without erythema. Cardiovascular: RRR, no murmurs Respiratory: CTAB, normal work of breathing on room air Abdomen: soft, nontender, nondistended, + bowel sounds Extremities: warm and well perfused, no LE edema  Neuro: alert and awake, grossly normal. 5/5 strength in UE and LE. Sensation intact throughout.  Laboratory: Recent Labs  Lab 09/17/17 0258 09/18/17 0226 09/19/17 0242  WBC 25.7* 30.1* 23.8*  HGB 12.4 11.8* 11.2*  HCT 37.7 36.5 35.4*  PLT 348 355 341   Recent Labs  Lab 09/15/17 1555  09/17/17 0258 09/18/17 0226 09/19/17 0242  NA 136   < > 135 136 135  K 4.2   < > 4.3 4.6 4.9  CL 105   < >  103 104 103  CO2 19*   < > 19* 21* 22  BUN 18   < > 25* 32* 34*  CREATININE 1.19*   < > 1.40* 1.40* 1.25*  CALCIUM 9.4   < > 9.5 9.6 8.9  PROT 7.3  --   --   --   --   BILITOT 0.7  --   --   --   --   ALKPHOS 82  --   --   --   --   ALT 13*  --   --   --   --   AST 17  --   --   --   --   GLUCOSE 359*   < > 366* 327* 310*   < > = values in this interval not displayed.     Bufford Lope, DO 09/19/2017, 8:24 AM PGY-2, [place] Intern pager: [phone], text pages welcome

## 2017-09-19 NOTE — Care Management Note (Signed)
Case Management Note  Patient Details  Name: [name] MRN: [id number] Date of Birth: 1955/11/05  Subjective/Objective:         Pt from home alone for temporal artery biopsy.  Pt feels she has returned to mobility baseline and can continue to use walker and cane at home as PTA.  Pt states she does need a 3n1 because of the low height of her toilet at home.  Pt states she may go home in a cab or uber.           Action/Plan: 3n1 ordered from [facility] to be delivered to room.  No other d/c needs at this time.   Expected Discharge Date:                  Expected Discharge Plan:  Home/Self Care  In-House Referral:  NA  Discharge planning Services  CM Consult  Post Acute Care Choice:  Durable Medical Equipment Choice offered to:  Patient  DME Arranged:  3-N-1 DME Agency:  [place]:  NA [place] Agency:  NA  Status of Service:  In process, will continue to follow  If discussed at Long Length of Stay Meetings, dates discussed:    Additional Comments:  Claudie Leach, RN 09/19/2017, 11:07 AM

## 2017-09-22 ENCOUNTER — Other Ambulatory Visit: Payer: Self-pay

## 2017-09-22 ENCOUNTER — Ambulatory Visit (INDEPENDENT_AMBULATORY_CARE_PROVIDER_SITE_OTHER): Payer: Medicare HMO | Admitting: Family Medicine

## 2017-09-22 ENCOUNTER — Encounter: Payer: Self-pay | Admitting: Licensed Clinical Social Worker

## 2017-09-22 ENCOUNTER — Encounter: Payer: Self-pay | Admitting: Family Medicine

## 2017-09-22 VITALS — BP 160/92 | HR 104 | Temp 98.1°F | Ht 62.0 in | Wt 242.0 lb

## 2017-09-22 DIAGNOSIS — H538 Other visual disturbances: Secondary | ICD-10-CM

## 2017-09-22 DIAGNOSIS — R519 Headache, unspecified: Secondary | ICD-10-CM

## 2017-09-22 DIAGNOSIS — H5213 Myopia, bilateral: Secondary | ICD-10-CM

## 2017-09-22 DIAGNOSIS — R51 Headache: Secondary | ICD-10-CM

## 2017-09-22 DIAGNOSIS — Z23 Encounter for immunization: Secondary | ICD-10-CM

## 2017-09-22 DIAGNOSIS — E118 Type 2 diabetes mellitus with unspecified complications: Secondary | ICD-10-CM

## 2017-09-22 MED ORDER — INSULIN GLARGINE 100 UNIT/ML ~~LOC~~ SOLN: 55.0000 [IU] | Freq: Every day | SUBCUTANEOUS | 11 refills | Status: DC

## 2017-09-22 MED ORDER — INSULIN ASPART 100 UNIT/ML ~~LOC~~ SOLN: SUBCUTANEOUS | 2 refills | Status: DC

## 2017-09-22 NOTE — Progress Notes (Signed)
    Subjective:  [name] is a 62 y.o. female who presents to the [facility] today with a chief complaint of hospital followup from right sided temporal pain and blurry vision.   HPI: Patient's biopsy for concern of arteritis was negative and she is happy about that.  She never did fill the steroid/tramadol prescription for concern of cost.  She has not been filling her insulin for concern of cost.  Her right sided blurry vision has resolved to symettric/chronic baseline blurriness at a distance.  She has no  More right sided temporal pain.  Wound from biopsy is healing well.  She would like some help figuring out if she can get her meds covered by insurance.  Objective:  Physical Exam: BP (!) 160/92   Pulse (!) 104   Temp 98.1 F (36.7 C) (Oral)   Ht 5\' 2"  (1.575 m)   Wt 242 lb (109.8 kg)   SpO2 98%   BMI 44.26 kg/m   Gen: NAD, conversing comfortably CV: RRR with no murmurs appreciated Eye: acuity is 20/40 right 20/50 left.  Near vision is 20/20.    No deficits to peripheral on gross exam.  Sclera clear, no obvious abnormalities on ophtalmic exam although pupils were not dilated. Pulm: NWOB, CTAB with no crackles, wheezes, or rhonchi GI: Normal bowel sounds present. Soft, Nontender, Nondistended. MSK: no edema, cyanosis, or clubbing noted Skin: Wound on right temple from biopsy is healing appropriately with no drainage/erythema.  warm, dry Neuro: grossly normal, moves all extremities Psych: Normal affect and thought content  No results found for this or any previous visit (from the past 72 hour(s)).   Assessment/Plan:  DM (diabetes mellitus) with complications ([place]) We discussed with patient her concern about  Affording her medications and checked against the humana site that they should be covered meds.   We also discussed that some of the increased charges she might see in January could be related to deductibles annually resetting.    We asked her to contact us before not  buying medications  Temporal headache Resolved per patient.  Biopsy taken for concern of arteritis but it was negatiive.  Patient never filled steroids.  We discussed risk of not filling medication without notifying doctor but she was concerned about cost.  Blurred vision, right eye Resolved per patient.  Now at baseline, symettric blurriness that she has been at for years when looking at things far away.  She is 20/40-20/50 bilaterally.  Will try to help her obtain optho f/u but cost could be a factor.  She was not seen inpatient during concern for unilateral blurry vision.  Near sighted, bilateral 20/40-20/50 bilaterally  Does not currently have glasses and has concern for affording them.   Have referred to Dr. Posey Pronto.  Jeanett Schlein is aware of costs concerns and is evaluating case for potential financial assistance   Sherene Sires, [place] - PGY1 09/23/2017 7:06 AM

## 2017-09-22 NOTE — Consult Note (Signed)
            [facility] CM Primary Care Navigator  09/22/2017  Anaya Bovee [facility] 06-04-1956 [id number]   Attempt to seepatient at the bedsideto identify possible discharge needs but she was alreadydischargedper staff.   Per chart review, patient was seen for right-sided headache with blurred vision. Temporal artery biopsy done on 2/15. During hospitalization, patient exhibited significant hyperglycemia secondary to steroids. She was discharged on  55 units of Lantus and to continue her NovoLog and Victoza. Patient was also discharged with a taper of steroids.  Primary care provider's officeis noted to be providing transition of care (TOC).  Patient has discharge instruction to follow-up with primary care provider on 2/19.  Primary care provider's office called Ander Purpura) to notify of patient's health issues needing follow-up- mainly DM.  Made aware to refer patient to [facility] CM if deemed necessary and appropriate for services.   For additional questions please contact:  Edwena Felty A. Julie-Anne Torain, BSN, RN-BC [facility] PRIMARY CARE Navigator Cell: [phone]

## 2017-09-22 NOTE — Progress Notes (Signed)
Type of Service: Clinical Social Work  Social work consult from Dr. Criss Rosales reference  Patient needs assistance with locating a provider for a diabetic eye exam. LCSW met with patient to assess the needs and barriers to getting the eye exam.  Patient's does not like riding the bus, is not interested in [facility] at this time.  Daughter and son alternate taking patient to medical appointments. No barriers identified, patient just needed assist with locating a provider.   Based on recommendation from Dr. Criss Rosales, LCSW called Dr. Celine Ahr Patel's office [phone] with patient while in exam room to assist her with scheduling the appointment.   Appointment scheduled March 5th at 1:15.  Location [place].  Patient appreciative of assistance with making the appointment.   Patient will have a specialist co-pay and $39.00 for refraction.  Casimer Lanius, LCSW Licensed Clinical Social Worker [place]   [phone] 10:44 AM

## 2017-09-22 NOTE — Patient Instructions (Signed)
It was a pleasure to see you today! Thank you for choosing [facility] for your primary care. [name] was seen for hospital followup. Come back to the clinic if you have any new concerns, and go to the emergency room if you have any life threatening symptoms.  Today we talked about your need to buy and take the lantus as well as seeing an eye doctor and trying to get glasses.  We also discussed that the biopsy result from the hospital said you did not have arteritis which is a good thing.  Our social worker will followup with you.   If we did any lab work today, and the results require attention, either me or my nurse will get in touch with you. If everything is normal, you will get a letter in mail and a message via . If you don't hear from [place] in two weeks, please give [place] a call. Otherwise, we look forward to seeing you again at your next visit. If you have any questions or concerns before then, please call the clinic at [phone].  Please bring all your medications to every doctors visit  Sign up for My Chart to have easy access to your labs results, and communication with your Primary care physician.    Please check-out at the front desk before leaving the clinic.    Best,  Dr. Sherene Sires FAMILY MEDICINE RESIDENT - PGY1 09/22/2017 9:57 AM

## 2017-09-23 ENCOUNTER — Other Ambulatory Visit: Payer: Self-pay | Admitting: Neurology

## 2017-09-23 ENCOUNTER — Encounter: Payer: Self-pay | Admitting: Family Medicine

## 2017-09-23 DIAGNOSIS — E785 Hyperlipidemia, unspecified: Principal | ICD-10-CM

## 2017-09-23 DIAGNOSIS — I10 Essential (primary) hypertension: Secondary | ICD-10-CM

## 2017-09-23 DIAGNOSIS — E1169 Type 2 diabetes mellitus with other specified complication: Secondary | ICD-10-CM

## 2017-09-23 NOTE — Assessment & Plan Note (Signed)
Resolved per patient.  Biopsy taken for concern of arteritis but it was negatiive.  Patient never filled steroids.  We discussed risk of not filling medication without notifying doctor but she was concerned about cost.

## 2017-09-23 NOTE — Assessment & Plan Note (Signed)
Resolved per patient.  Now at baseline, symettric blurriness that she has been at for years when looking at things far away.  She is 20/40-20/50 bilaterally.  Will try to help her obtain optho f/u but cost could be a factor.  She was not seen inpatient during concern for unilateral blurry vision.

## 2017-09-23 NOTE — Assessment & Plan Note (Signed)
We discussed with patient her concern about  Affording her medications and checked against the humana site that they should be covered meds.   We also discussed that some of the increased charges she might see in January could be related to deductibles annually resetting.    We asked her to contact us before not buying medications

## 2017-09-23 NOTE — Assessment & Plan Note (Signed)
20/40-20/50 bilaterally  Does not currently have glasses and has concern for affording them.   Have referred to Dr. Posey Pronto.  Jeanett Schlein is aware of costs concerns and is evaluating case for potential financial assistance

## 2017-09-23 NOTE — Assessment & Plan Note (Signed)
>>  ASSESSMENT AND PLAN FOR UNCONTROLLED DIABETES MELLITUS WITH COMPLICATION, WITH LONG-TERM CURRENT USE OF INSULIN WRITTEN ON 09/23/2017  6:56 AM BY BLAND, SCOTT, DO  We discussed with patient her concern about  Affording her medications and checked against the humana site that they should be covered meds.   We also discussed that some of the increased charges she might see in January could be related to deductibles annually resetting.    We asked her to contact us before not buying medications

## 2017-10-01 ENCOUNTER — Telehealth: Payer: Self-pay | Admitting: Licensed Clinical Social Worker

## 2017-10-01 NOTE — Progress Notes (Signed)
Type of Service: Clinical Social Work  LCSW called patient to remind her of eye appointment on March 5th with Dr. Posey Pronto.  Also instructed her to call Dr. Lynnell Chad office to ask questions about the cost of the appointment as it relates to her speciality co-pay and other possible cost. LCSW provided phone number to Dr. Lynnell Chad office.       Casimer Lanius, LCSW Licensed Clinical Social Worker [place]   [phone] 9:46 AM

## 2017-10-06 DIAGNOSIS — H25813 Combined forms of age-related cataract, bilateral: Secondary | ICD-10-CM | POA: Diagnosis not present

## 2017-10-06 DIAGNOSIS — E119 Type 2 diabetes mellitus without complications: Secondary | ICD-10-CM | POA: Diagnosis not present

## 2017-10-06 DIAGNOSIS — H52223 Regular astigmatism, bilateral: Secondary | ICD-10-CM | POA: Diagnosis not present

## 2017-10-06 LAB — HM DIABETES EYE EXAM

## 2017-10-06 NOTE — Progress Notes (Signed)
Type of Service: Clinical Social Work Consult  2nd call to patient to remind her of eye appointment today with Dr. Posey Pronto. Patient did not receive the message LCSW left due to problems with her phone.  Discussed speciality co-pay, other possible cost and transportation.  No barriers identified for appointment today.  Patient was appreciative of the F/U call.    Casimer Lanius, LCSW Licensed Clinical Social Worker [place]   [phone] 9:26 AM

## 2017-10-20 ENCOUNTER — Encounter: Payer: Self-pay | Admitting: Internal Medicine

## 2017-10-21 ENCOUNTER — Other Ambulatory Visit: Payer: Self-pay | Admitting: Neurology

## 2017-10-21 DIAGNOSIS — I10 Essential (primary) hypertension: Secondary | ICD-10-CM

## 2017-10-21 DIAGNOSIS — E785 Hyperlipidemia, unspecified: Principal | ICD-10-CM

## 2017-10-21 DIAGNOSIS — E1169 Type 2 diabetes mellitus with other specified complication: Secondary | ICD-10-CM

## 2017-11-19 ENCOUNTER — Ambulatory Visit (INDEPENDENT_AMBULATORY_CARE_PROVIDER_SITE_OTHER): Payer: Medicare HMO | Admitting: Internal Medicine

## 2017-11-19 ENCOUNTER — Encounter: Payer: Self-pay | Admitting: Internal Medicine

## 2017-11-19 VITALS — BP 150/60 | HR 81 | Temp 98.1°F | Wt 240.6 lb

## 2017-11-19 DIAGNOSIS — E118 Type 2 diabetes mellitus with unspecified complications: Secondary | ICD-10-CM

## 2017-11-19 DIAGNOSIS — E785 Hyperlipidemia, unspecified: Secondary | ICD-10-CM

## 2017-11-19 DIAGNOSIS — E1169 Type 2 diabetes mellitus with other specified complication: Secondary | ICD-10-CM

## 2017-11-19 DIAGNOSIS — Z794 Long term (current) use of insulin: Secondary | ICD-10-CM

## 2017-11-19 DIAGNOSIS — Z0001 Encounter for general adult medical examination with abnormal findings: Secondary | ICD-10-CM | POA: Diagnosis not present

## 2017-11-19 DIAGNOSIS — Z1211 Encounter for screening for malignant neoplasm of colon: Secondary | ICD-10-CM

## 2017-11-19 DIAGNOSIS — Z1231 Encounter for screening mammogram for malignant neoplasm of breast: Secondary | ICD-10-CM

## 2017-11-19 DIAGNOSIS — I482 Chronic atrial fibrillation, unspecified: Secondary | ICD-10-CM

## 2017-11-19 DIAGNOSIS — E1165 Type 2 diabetes mellitus with hyperglycemia: Secondary | ICD-10-CM | POA: Diagnosis not present

## 2017-11-19 DIAGNOSIS — IMO0002 Reserved for concepts with insufficient information to code with codable children: Secondary | ICD-10-CM

## 2017-11-19 DIAGNOSIS — Z1239 Encounter for other screening for malignant neoplasm of breast: Secondary | ICD-10-CM

## 2017-11-19 DIAGNOSIS — I10 Essential (primary) hypertension: Secondary | ICD-10-CM

## 2017-11-19 MED ORDER — RIVAROXABAN 20 MG PO TABS: 20.0000 mg | ORAL_TABLET | Freq: Every day | ORAL | 3 refills | Status: DC

## 2017-11-19 MED ORDER — MELOXICAM 7.5 MG PO TABS: 7.5000 mg | ORAL_TABLET | Freq: Every day | ORAL | 2 refills | Status: DC

## 2017-11-19 MED ORDER — LIRAGLUTIDE 18 MG/3ML ~~LOC~~ SOPN: 1.8000 mg | PEN_INJECTOR | Freq: Every day | SUBCUTANEOUS | 2 refills | Status: DC

## 2017-11-19 MED ORDER — INSULIN GLARGINE 100 UNIT/ML SOLOSTAR PEN: 55.0000 [IU] | PEN_INJECTOR | Freq: Every day | SUBCUTANEOUS | 11 refills | Status: DC

## 2017-11-19 MED ORDER — NYSTATIN 100000 UNIT/GM EX POWD: Freq: Four times a day (QID) | CUTANEOUS | 0 refills | Status: DC

## 2017-11-19 MED ORDER — LISINOPRIL 40 MG PO TABS: ORAL_TABLET | ORAL | 3 refills | Status: DC

## 2017-11-19 MED ORDER — CLONIDINE HCL 0.2 MG PO TABS: 0.2000 mg | ORAL_TABLET | Freq: Two times a day (BID) | ORAL | 3 refills | Status: DC

## 2017-11-19 MED ORDER — SPIRONOLACTONE 50 MG PO TABS: 50.0000 mg | ORAL_TABLET | Freq: Every day | ORAL | 3 refills | Status: DC

## 2017-11-19 NOTE — Assessment & Plan Note (Signed)
Continue statin. Last lipid panel two months ago WNL, so will not recheck today.

## 2017-11-19 NOTE — Patient Instructions (Addendum)
It was nice seeing you again today [name]!  Please continue taking all of your medications as you have been. Please also call to schedule your mammogram. I have placed a referral for a colonoscopy for you. They will call you with the date and time of this appointment.   I will see you back in one month to check your A1C.   If you have any questions or concerns, please feel free to call the clinic.   Be well,  Dr. Avon Gully

## 2017-11-19 NOTE — Assessment & Plan Note (Signed)
>>  ASSESSMENT AND PLAN FOR UNCONTROLLED DIABETES MELLITUS WITH COMPLICATION, WITH LONG-TERM CURRENT USE OF INSULIN WRITTEN ON 11/19/2017  2:18 PM BY LANCASTER, ABIGAIL JOSEPH, MD  Reported home CBGs acceptable, however last A1C very elevated at 14.9. Will continue current diabetes med regimen for now, but will f/u in one month for repeat A1C and possible med changes if necessary.

## 2017-11-19 NOTE — Assessment & Plan Note (Signed)
Continue Xarelto as prescribed

## 2017-11-19 NOTE — Assessment & Plan Note (Signed)
Reported home CBGs acceptable, however last A1C very elevated at 14.9. Will continue current diabetes med regimen for now, but will f/u in one month for repeat A1C and possible med changes if necessary.

## 2017-11-19 NOTE — Progress Notes (Signed)
62 y.o. year old female presents for well woman/preventative visit and annual GYN examination.  Acute Concerns: DM Currently prescribed Novolog 20U at breakfast, 30U with lunch, as well as 55U Lantus qhs. Also taking Victoza 1.8mg  qd. Checks blood sugar 2-3x/d. 112 lowest. 150 average. Highest in 200s.   HTN Prescribed amlodipine, clonidine, lisinopril, spironolactone. Does not check BP at home. Sometimes checks at [facility]. Usually 160s/80s, once 210/96. Did see ophtho.   A.fib Taking Xarelto as prescribed.   Diet: Lunch provided by [employer]. Often does not eat three meals per day because she is not hungry. Does not take Novolog for meals she does not eat. Other than [employer], eats primarily home cooked foods, especially fried foods. Drinks only Sprite Zero and coffee with Splenda. Does not drink water or any other beverages, including sweet tea or other sodas. Does not eat sweets.   Exercise: None  Sexual/Birth History: [id number]. Not sexually active.   Birth Control:  Post-menopausal  Surgical History: Past Surgical History:  Procedure Laterality Date  . ARTERY BIOPSY Right 09/18/2017   Procedure: BIOPSY OF RIGHT TEMPORAL ARTERY;  Surgeon: Coralie Keens, MD;  Location: [facility];  Service: General;  Laterality: Right;  . DILATION AND CURETTAGE OF UTERUS  1976  . MENISCUS REPAIR  03/09   right knee  . TONSILECTOMY, ADENOIDECTOMY, BILATERAL MYRINGOTOMY AND TUBES  age 61  . TONSILLECTOMY      Allergies: Allergies  Allergen Reactions  . Propoxyphene N-Acetaminophen Hives, Itching, Swelling and Other (See Comments)    REACTION: Hallucinations  . Glipizide Nausea And Vomiting  . Metformin And Related Diarrhea    Social:  Social History   Socioeconomic History  . Marital status: Legally Separated    Spouse name: Not on file  . Number of children: 3  . Years of education: Not on file  . Highest education level: Not on file  Occupational History  .  Occupation: med Youth worker  . Financial resource strain: Not on file  . Food insecurity:    Worry: Not on file    Inability: Not on file  . Transportation needs:    Medical: Not on file    Non-medical: Not on file  Tobacco Use  . Smoking status: Never Smoker  . Smokeless tobacco: Never Used  Substance and Sexual Activity  . Alcohol use: No  . Drug use: No  . Sexual activity: Not on file  Lifestyle  . Physical activity:    Days per week: 0 days    Minutes per session: Not on file  . Stress: Only a little  Relationships  . Social connections:    Talks on phone: More than three times a week    Gets together: Once a week    Attends religious service: Not on file    Active member of club or organization: Not on file    Attends meetings of clubs or organizations: Not on file    Relationship status: Not on file  Other Topics Concern  . Not on file  Social History Narrative   Boyfriend has lung cancer.   Lives with grandson and youngest daughter.    Immunization: Immunization History  Administered Date(s) Administered  . Influenza Split 05/04/2012  . Influenza Whole 07/30/2010  . Influenza,inj,Quad PF,6+ Mos 09/06/2013, 06/11/2015, 04/21/2016, 09/22/2017  . Pneumococcal Polysaccharide-23 05/27/2011  . Td 11/05/2009    Cancer Screening:  Pap Smear: Last 2011. Declines today despite counseling.   Mammogram: Last 10/2016. No  suspicious findings. Recommended repeat in 1 yr.   Colonoscopy: Last 2006  Physical Exam: VITALS: Reviewed GEN: Pleasant female, NAD HEENT: Normocephalic, PERRL, EOMI, no scleral icterus, bilateral TM pearly grey, nasal septum midline, MMM, uvula midline, no anterior or posterior lymphadenopathy, no thyromegaly CARDIAC:RRR, S1 and S2 present, no murmur, no heaves/thrills RESP: CTAB, normal effort ABD: Soft, no tenderness, normal bowel sounds EXT: No edema, 2+ radial and DP pulses SKIN: Warm and dry, no rash  ASSESSMENT & PLAN: 62 y.o.  female presents for annual well woman/preventative exam and GYN exam. Please see problem specific assessment and plan.   Atrial fibrillation (HCC) Continue Xarelto as prescribed  Essential hypertension BP above goal today at 150/60. Patient left prior to rechecking. Did say she has taken her BP meds today, and is on an extensive regimen. Will follow up at next appt in one month to ensure BP at goal then, and if not, will need to make change to regimen.   DM (diabetes mellitus) with complications ([place]) Reported home CBGs acceptable, however last A1C very elevated at 14.9. Will continue current diabetes med regimen for now, but will f/u in one month for repeat A1C and possible med changes if necessary.   Hyperlipidemia associated with type 2 diabetes mellitus Continue statin. Last lipid panel two months ago WNL, so will not recheck today.    Adin Hector, MD, MPH PGY-3 [facility] Pager [phone]

## 2017-11-19 NOTE — Assessment & Plan Note (Signed)
BP above goal today at 150/60. Patient left prior to rechecking. Did say [name] has taken her BP meds today, and is on an extensive regimen. Will follow up at next appt in one month to ensure BP at goal then, and if not, will need to make change to regimen.

## 2017-12-17 ENCOUNTER — Ambulatory Visit: Payer: Medicare HMO | Admitting: Internal Medicine

## 2018-01-14 ENCOUNTER — Ambulatory Visit: Payer: Medicare HMO | Admitting: Internal Medicine

## 2018-06-21 ENCOUNTER — Ambulatory Visit: Payer: Medicare HMO | Admitting: Family Medicine

## 2018-06-21 ENCOUNTER — Ambulatory Visit (INDEPENDENT_AMBULATORY_CARE_PROVIDER_SITE_OTHER): Payer: Medicare HMO | Admitting: Family Medicine

## 2018-06-21 VITALS — BP 120/72 | HR 84 | Temp 98.3°F | Wt 243.4 lb

## 2018-06-21 DIAGNOSIS — E118 Type 2 diabetes mellitus with unspecified complications: Secondary | ICD-10-CM | POA: Diagnosis not present

## 2018-06-21 DIAGNOSIS — E1165 Type 2 diabetes mellitus with hyperglycemia: Secondary | ICD-10-CM | POA: Diagnosis not present

## 2018-06-21 DIAGNOSIS — G8929 Other chronic pain: Secondary | ICD-10-CM | POA: Insufficient documentation

## 2018-06-21 DIAGNOSIS — Z23 Encounter for immunization: Secondary | ICD-10-CM

## 2018-06-21 DIAGNOSIS — M545 Low back pain, unspecified: Secondary | ICD-10-CM | POA: Insufficient documentation

## 2018-06-21 DIAGNOSIS — Z794 Long term (current) use of insulin: Secondary | ICD-10-CM

## 2018-06-21 DIAGNOSIS — I4891 Unspecified atrial fibrillation: Secondary | ICD-10-CM | POA: Diagnosis not present

## 2018-06-21 DIAGNOSIS — I10 Essential (primary) hypertension: Secondary | ICD-10-CM | POA: Diagnosis not present

## 2018-06-21 DIAGNOSIS — IMO0002 Reserved for concepts with insufficient information to code with codable children: Secondary | ICD-10-CM

## 2018-06-21 DIAGNOSIS — E1140 Type 2 diabetes mellitus with diabetic neuropathy, unspecified: Secondary | ICD-10-CM | POA: Insufficient documentation

## 2018-06-21 DIAGNOSIS — E1142 Type 2 diabetes mellitus with diabetic polyneuropathy: Secondary | ICD-10-CM | POA: Diagnosis not present

## 2018-06-21 LAB — POCT GLYCOSYLATED HEMOGLOBIN (HGB A1C): HbA1c, POC (controlled diabetic range): 11 % — AB (ref 0.0–7.0)

## 2018-06-21 MED ORDER — RIVAROXABAN 20 MG PO TABS: 20.0000 mg | ORAL_TABLET | Freq: Every day | ORAL | 3 refills | Status: DC

## 2018-06-21 MED ORDER — ONETOUCH DELICA LANCETS 33G MISC: 11 refills | Status: DC

## 2018-06-21 MED ORDER — GABAPENTIN 100 MG PO CAPS: 100.0000 mg | ORAL_CAPSULE | Freq: Three times a day (TID) | ORAL | 3 refills | Status: DC

## 2018-06-21 MED ORDER — ONETOUCH VERIO W/DEVICE KIT: PACK | 0 refills | Status: DC

## 2018-06-21 MED ORDER — GLUCOSE BLOOD VI STRP: ORAL_STRIP | 12 refills | Status: DC

## 2018-06-21 NOTE — Progress Notes (Addendum)
Date of Visit: 06/21/2018   HPI:  Patient presents for routine follow up. PCP is Dr. Yisroel Ramming. Last visit here in April 2019.  Diabetes - Last time checked sugar it was in low 200s. Moved in August and hasn't had a meter since then. Current regimen is novolog 10 units in AM and 20 at lunch, Victoza 1.8mg  daily, and Lantus 65 units at night. Has felt like sugar has dropped low but not often. No chest pain or shortness of breath. Has eaten less lately as her back has been bothering her, she credits her improved A1c to this.  Hypertension - Out of amlodipine for a couple of weeks, out of clonidine for 6 months. Does take lisinopril 40mg  daily and spironolactone 50mg  daily. Tolerating these well. Last blood pressure check at home was 140/98.  Afib - takes xarelto 20mg  daily. Needs refilled. No bleeding.  Back pain - has chronic low back pain. Has done physical therapy in the past but didn't find it helpful. Not interested in epidural steroid shots. Has been told she has degenerative disc disease in the past. Takes meloxicam without relief. Nothing has helped. Has not tried gabapentin before. Does endorse tingling in both of her feet. Also has sharp pain radiating down R side of her leg, stops above the knee. Denies having fever, saddle anesthesia, lower extremity weakness, or problems with stooling or urination.   ROS: See HPI.  [place]: history of ischemic stroke, hyperlipidemia, type 2 diabetes, hypertension, afib, depression, OSA, obesity, CKD2, stress incontinence  PHYSICAL EXAM: BP 120/72   Pulse 84   Temp 98.3 F (36.8 C) (Oral)   Wt 243 lb 6.4 oz (110.4 kg)   SpO2 100%   BMI 44.52 kg/m  Gen: no acute distress, pleasant, cooperative HEENT: normocephalic, atraumatic  Heart: irregularly irregular, no murmurs, rate controlled Lungs: clear to auscultation bilaterally, normal work of breathing  Neuro: alert, grossly nonfocal, speech normal Ext: full strength & sensation bilateral lower  extremities Back: mild tenderness to palpation over bilateral low back Diabetic foot exam: 2+ DP pulses bilat, decreased sensation to monofilament testing bilaterally. No lesions or significant calluses.   ASSESSMENT/PLAN:  Health maintenance:  -diabetic foot exam done today -handout given on scheduling mammogram -flu shot given today  -attempted to discuss colonoscopy w/ patient but she prefers first to focus on mammogram  DM (diabetes mellitus) with complications ([place]) Uncontrolled with A1c of 11 but improved from 14.9 in February. Has not been checking sugars with any regularity. New meter prescribed. Will have her follow up with log in 2 weeks with PCP to adjust insulin regimen.  Diabetic neuropathy (HCC) Decreased sensation with monofilament testing today. Counseled to thoroughly inspect feet daily & keep them clean from all cuts etc. Starting gabapentin due to tingling in bilateral feet - may also help back pain.  Atrial fibrillation ([place]) Rate controlled. Refill xarelto today.  Chronic low back pain Chronic issue. Will do trial of gabapentin 100mg  three times daily with plenty of room to titrate upward if not improving. Encouraged patient to consider steroid epidural injections but she does not want any shots.  Essential hypertension Well controlled while off amlodipine and clonidine. Will remove these medications from her medication list.  FOLLOW UP: Follow up in 2 weeks for diabetes with PCP.  [place]. Ardelia Mems, [place]

## 2018-06-21 NOTE — Assessment & Plan Note (Addendum)
Chronic issue. Will do trial of gabapentin 100mg  three times daily with plenty of room to titrate upward if not improving. Encouraged patient to consider steroid epidural injections but she does not want any shots.

## 2018-06-21 NOTE — Assessment & Plan Note (Signed)
Well controlled while off amlodipine and clonidine. Will remove these medications from her medication list.

## 2018-06-21 NOTE — Assessment & Plan Note (Addendum)
Decreased sensation with monofilament testing today. Counseled to thoroughly inspect feet daily & keep them clean from all cuts etc. Starting gabapentin due to tingling in bilateral feet - may also help back pain.

## 2018-06-21 NOTE — Patient Instructions (Addendum)
See the handout on how to schedule your mammogram. This is an important test to screen for breast cancer.  Check sugar 3 times a day Bring log in to see Dr. Maryellen Pile in new meter for you  Take gabapentin 100mg  three times daily for your pain - may help back and legs  Refilled xarelto.  Follow up in 2 weeks with Dr. Yisroel Ramming for diabetes

## 2018-06-21 NOTE — Assessment & Plan Note (Signed)
Uncontrolled with A1c of 11 but improved from 14.9 in February. Has not been checking sugars with any regularity. New meter prescribed. Will have her follow up with log in 2 weeks with PCP to adjust insulin regimen.

## 2018-06-21 NOTE — Assessment & Plan Note (Signed)
>>  ASSESSMENT AND PLAN FOR UNCONTROLLED DIABETES MELLITUS WITH COMPLICATION, WITH LONG-TERM CURRENT USE OF INSULIN WRITTEN ON 06/21/2018  2:35 PM BY MCINTYRE, Delorse Limber, MD  Uncontrolled with A1c of 11 but improved from 14.9 in February. Has not been checking sugars with any regularity. New meter prescribed. Will have her follow up with log in 2 weeks with PCP to adjust insulin regimen.

## 2018-06-21 NOTE — Assessment & Plan Note (Signed)
Rate controlled. Refill xarelto today.

## 2018-10-14 ENCOUNTER — Other Ambulatory Visit: Payer: Self-pay

## 2018-10-14 DIAGNOSIS — I10 Essential (primary) hypertension: Secondary | ICD-10-CM

## 2018-10-14 MED ORDER — LISINOPRIL 40 MG PO TABS: ORAL_TABLET | ORAL | 3 refills | Status: DC

## 2018-11-08 ENCOUNTER — Telehealth: Payer: Self-pay | Admitting: Family Medicine

## 2018-11-08 ENCOUNTER — Telehealth: Payer: Medicare HMO

## 2018-11-08 ENCOUNTER — Other Ambulatory Visit: Payer: Self-pay

## 2018-11-08 NOTE — Telephone Encounter (Signed)
Attempted to call patient for her scheduled virtual visit. No answer. LVM asking her to call back if she would like to proceed with visit.  Leeanne Rio, MD

## 2018-11-09 ENCOUNTER — Other Ambulatory Visit: Payer: Self-pay

## 2018-11-09 ENCOUNTER — Telehealth (INDEPENDENT_AMBULATORY_CARE_PROVIDER_SITE_OTHER): Payer: Medicare HMO | Admitting: Family Medicine

## 2018-11-09 DIAGNOSIS — I10 Essential (primary) hypertension: Secondary | ICD-10-CM | POA: Diagnosis not present

## 2018-11-09 DIAGNOSIS — E118 Type 2 diabetes mellitus with unspecified complications: Secondary | ICD-10-CM

## 2018-11-09 DIAGNOSIS — M545 Low back pain, unspecified: Secondary | ICD-10-CM

## 2018-11-09 DIAGNOSIS — G8929 Other chronic pain: Secondary | ICD-10-CM | POA: Diagnosis not present

## 2018-11-09 DIAGNOSIS — E119 Type 2 diabetes mellitus without complications: Secondary | ICD-10-CM | POA: Diagnosis not present

## 2018-11-09 MED ORDER — AMLODIPINE BESYLATE 5 MG PO TABS: 5.0000 mg | ORAL_TABLET | Freq: Every day | ORAL | 1 refills | Status: DC

## 2018-11-09 MED ORDER — NYSTATIN 100000 UNIT/GM EX POWD: Freq: Three times a day (TID) | CUTANEOUS | 1 refills | Status: DC

## 2018-11-09 MED ORDER — GABAPENTIN 300 MG PO CAPS: 300.0000 mg | ORAL_CAPSULE | Freq: Three times a day (TID) | ORAL | 1 refills | Status: DC

## 2018-11-09 NOTE — Progress Notes (Signed)
[facility] Visit  Patient consented to have visit conducted via telephone.  Encounter participants: Patient: [name]  Provider: Chrisandra Netters  Others (if applicable): n/a  Chief Complaint: medication refills  HPI:  Diabetes - Currently taking lantus 55u at bedtime and novolog 20 u with breakfast and 30 u with lunch. Also takes victoza 1.8mg  daily. Typically sugars run in low 100s while fasting. Never has low sugars. Has not been careful with what she's eating, just eats whatever her daughter cooks. Does not typically check sugars later in the day but on the occasions she has, they've been in the high 300s.  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 Requests refill of this today.  Hypertension - out of lisinopril and spironolactone. Yesterday blood pressure was 112/92. This is typically what she's gotten lately. Never gets diastolic over 144 or top number over 140. Is not getting any low blood pressures. Patient states she is afraid to not be on any blood pressure medications.   Back pain - has chronic pain in low back. Radiates down R leg. Has trouble standing for more than 10-15 minutes due to pain. No issues with bowel/bladder control. No fevers. No true weakness, just inability tos tand for long times due to pain. No saddle anesthesia. Previously was started on gabapentin 100mg  three times daily with no relief. Took it for a whole month.  ROS: per HPI  Pertinent PMHx: history of hypertension, hyperlipidemia, diabetes, afib on xarelto, prior stroke  Exam:  Respiratory: Patient speaking normally in full sentences throughout the phone encounter, without any respiratory distress evident.   Assessment/Plan:  Essential hypertension Blood pressure sounds well controlled based on home readings despite patient not being on any medication. She is hesitant to truly go without blood  pressure medications, concerned because she says she's had a stroke and ministrokes in the past. We compromised on adding amlodipine 5mg  daily. Follow up in 2 weeks by virtual visit to see how blood pressures are running. Patient agreeable.  DM (diabetes mellitus) with complications ([place]) Difficult to adjust current insulin regimen due to paucity of blood sugar data. No low sugars though. Suspect she may  Need higher mealtime coverage. Will have her regularly check sugars 2 hours after biggest meal as well as fasting in the AM, and keep a chart. Follow up virtual visit scheduled in 2 weeks to see how sugars are running and make adjustments.  Chronic low back pain No red flags. Did not benefit from gabapentin 100mg  three times daily. Will increase to 300mg  three times daily. Follow up in 2 wks for virtual visit to monitor for improvement.   Intertrigo between breasts Chronic, Refill nystatin  Follow up in 2 weeks for virtual visit, scheduled for patient.  Encouraged patient to stay home as much as possible to avoid exposure to COVID. Educated on emphasis of telehealth right now to minimize risk of disease transmission. Advised she call us with any concerns or needs.  Patient agreeable to this plan & appreciative.  Time spent on phone with patient: 25 minutes

## 2018-11-09 NOTE — Assessment & Plan Note (Signed)
>>  ASSESSMENT AND PLAN FOR UNCONTROLLED DIABETES MELLITUS WITH COMPLICATION, WITH LONG-TERM CURRENT USE OF INSULIN WRITTEN ON 11/09/2018  2:16 PM BY MCINTYRE, BRITTANY J, MD  Difficult to adjust current insulin regimen due to paucity of blood sugar data. No low sugars though. Suspect she may  Need higher mealtime coverage. Will have her regularly check sugars 2 hours after biggest meal as well as fasting in the AM, and keep a chart. Follow up virtual visit scheduled in 2 weeks to see how sugars are running and make adjustments.

## 2018-11-09 NOTE — Assessment & Plan Note (Signed)
Blood pressure sounds well controlled based on home readings despite patient not being on any medication. She is hesitant to truly go without blood pressure medications, concerned because she says she's had a stroke and ministrokes in the past. We compromised on adding amlodipine 5mg  daily. Follow up in 2 weeks by virtual visit to see how blood pressures are running. Patient agreeable.

## 2018-11-09 NOTE — Assessment & Plan Note (Signed)
No red flags. Did not benefit from gabapentin 100mg  three times daily. Will increase to 300mg  three times daily. Follow up in 2 wks for virtual visit to monitor for improvement.

## 2018-11-09 NOTE — Assessment & Plan Note (Signed)
Difficult to adjust current insulin regimen due to paucity of blood sugar data. No low sugars though. Suspect she may  Need higher mealtime coverage. Will have her regularly check sugars 2 hours after biggest meal as well as fasting in the AM, and keep a chart. Follow up virtual visit scheduled in 2 weeks to see how sugars are running and make adjustments.

## 2018-11-22 ENCOUNTER — Other Ambulatory Visit: Payer: Self-pay | Admitting: Family Medicine

## 2018-11-22 DIAGNOSIS — E118 Type 2 diabetes mellitus with unspecified complications: Secondary | ICD-10-CM

## 2018-11-23 ENCOUNTER — Other Ambulatory Visit: Payer: Self-pay

## 2018-11-23 ENCOUNTER — Telehealth: Payer: Medicare HMO | Admitting: Family Medicine

## 2018-11-23 ENCOUNTER — Other Ambulatory Visit: Payer: Self-pay | Admitting: *Deleted

## 2018-11-23 MED ORDER — INSULIN GLARGINE 100 UNIT/ML SOLOSTAR PEN: 55.0000 [IU] | PEN_INJECTOR | Freq: Every day | SUBCUTANEOUS | 11 refills | Status: DC

## 2018-11-23 NOTE — Progress Notes (Signed)
I tried to reach this patient three times without any success. Please help reschedule whenever she calls back.

## 2019-01-01 ENCOUNTER — Other Ambulatory Visit: Payer: Self-pay | Admitting: Family Medicine

## 2019-01-19 ENCOUNTER — Emergency Department (HOSPITAL_COMMUNITY)
Admission: EM | Admit: 2019-01-19 | Discharge: 2019-01-19 | Disposition: A | Discharge status: Home / Self Care | Payer: Medicare HMO | Attending: Emergency Medicine | Admitting: Emergency Medicine

## 2019-01-19 ENCOUNTER — Emergency Department (HOSPITAL_COMMUNITY): Payer: Medicare HMO

## 2019-01-19 ENCOUNTER — Other Ambulatory Visit: Payer: Self-pay

## 2019-01-19 ENCOUNTER — Encounter (HOSPITAL_COMMUNITY): Payer: Self-pay | Admitting: Emergency Medicine

## 2019-01-19 DIAGNOSIS — E1122 Type 2 diabetes mellitus with diabetic chronic kidney disease: Secondary | ICD-10-CM | POA: Insufficient documentation

## 2019-01-19 DIAGNOSIS — R0789 Other chest pain: Secondary | ICD-10-CM

## 2019-01-19 DIAGNOSIS — E1165 Type 2 diabetes mellitus with hyperglycemia: Secondary | ICD-10-CM | POA: Insufficient documentation

## 2019-01-19 DIAGNOSIS — Z79899 Other long term (current) drug therapy: Secondary | ICD-10-CM | POA: Diagnosis not present

## 2019-01-19 DIAGNOSIS — N182 Chronic kidney disease, stage 2 (mild): Secondary | ICD-10-CM | POA: Insufficient documentation

## 2019-01-19 DIAGNOSIS — Z8673 Personal history of transient ischemic attack (TIA), and cerebral infarction without residual deficits: Secondary | ICD-10-CM | POA: Insufficient documentation

## 2019-01-19 DIAGNOSIS — Z7901 Long term (current) use of anticoagulants: Secondary | ICD-10-CM | POA: Insufficient documentation

## 2019-01-19 DIAGNOSIS — I129 Hypertensive chronic kidney disease with stage 1 through stage 4 chronic kidney disease, or unspecified chronic kidney disease: Secondary | ICD-10-CM | POA: Diagnosis not present

## 2019-01-19 DIAGNOSIS — Z794 Long term (current) use of insulin: Secondary | ICD-10-CM | POA: Diagnosis not present

## 2019-01-19 DIAGNOSIS — R079 Chest pain, unspecified: Secondary | ICD-10-CM | POA: Diagnosis not present

## 2019-01-19 DIAGNOSIS — R739 Hyperglycemia, unspecified: Secondary | ICD-10-CM

## 2019-01-19 LAB — BASIC METABOLIC PANEL
Anion gap: 13 (ref 5–15)
BUN: 12 mg/dL (ref 8–23)
CO2: 20 mmol/L — ABNORMAL LOW (ref 22–32)
Calcium: 9.3 mg/dL (ref 8.9–10.3)
Chloride: 103 mmol/L (ref 98–111)
Creatinine, Ser: 1.05 mg/dL — ABNORMAL HIGH (ref 0.44–1.00)
GFR calc Af Amer: >60 mL/min (ref >60)
GFR calc non Af Amer: 56 mL/min — ABNORMAL LOW (ref >60)
Glucose, Bld: 357 mg/dL — ABNORMAL HIGH (ref 70–99)
Potassium: 4.2 mmol/L (ref 3.5–5.1)
Sodium: 136 mmol/L (ref 135–145)

## 2019-01-19 LAB — CBC
HCT: 43.1 % (ref 36.0–46.0)
Hemoglobin: 13.1 g/dL (ref 12.0–15.0)
MCH: 21.5 pg — ABNORMAL LOW (ref 26.0–34.0)
MCHC: 30.4 g/dL (ref 30.0–36.0)
MCV: 70.9 fL — ABNORMAL LOW (ref 80.0–100.0)
Platelets: 329 10*3/uL (ref 150–400)
RBC: 6.08 MIL/uL — ABNORMAL HIGH (ref 3.87–5.11)
RDW: 15.7 % — ABNORMAL HIGH (ref 11.5–15.5)
WBC: 7.5 10*3/uL (ref 4.0–10.5)
nRBC: 0 % (ref 0.0–0.2)

## 2019-01-19 LAB — TROPONIN I
Troponin I: <0.03 ng/mL (ref <0.03)
Troponin I: <0.03 ng/mL (ref <0.03)

## 2019-01-19 MED ORDER — CYCLOBENZAPRINE HCL 10 MG PO TABS: 10.0000 mg | ORAL_TABLET | Freq: Two times a day (BID) | ORAL | 0 refills | Status: DC | PRN

## 2019-01-19 MED ORDER — HYDROCODONE-ACETAMINOPHEN 5-325 MG PO TABS
1.0000 | ORAL_TABLET | Freq: Once | ORAL | Status: AC
  Administered 2019-01-19: 1 via ORAL
  Filled 2019-01-19: qty 1

## 2019-01-19 MED ORDER — SODIUM CHLORIDE 0.9% FLUSH: 3.0000 mL | Freq: Once | INTRAVENOUS | Status: DC

## 2019-01-19 MED ORDER — TRAMADOL HCL 50 MG PO TABS: 50.0000 mg | ORAL_TABLET | Freq: Four times a day (QID) | ORAL | 0 refills | Status: DC | PRN

## 2019-01-19 NOTE — ED Provider Notes (Signed)
[place] EMERGENCY DEPARTMENT Provider Note   CSN: [id number] Arrival date & time: 01/19/19  1526     History   Chief Complaint Chief Complaint  Patient presents with  . Chest Pain    HPI [name] is a 63 y.o. female who presents with chest pain.  Past medical history significant for history of CVA, atrial fibrillation currently on Xarelto, insulin-dependent diabetes which is uncontrolled, hypertension, hyperlipidemia, chronic back pain.  The patient states that around 4 PM she was at the grocery store riding on a motorized cart when she had acute left-sided chest pain.  The pain is constant, worse with movement of her arm, nonradiating.  The pain is severe and is 8 out of 10.  She has had this pain in the past and states it feels like when she has had "angina".  She denies fever, chills, numbness, tingling, shortness of breath, cough, abdominal pain, nausea, vomiting, swelling in the legs.  She denies any trauma to the area.  She has not taken anything for pain.  She states she had a cardiac cath many years ago but denies history of coronary artery disease.  Echo in 2015 showed EF 60 to 65%     HPI  Past Medical History:  Diagnosis Date  . Atrial fibrillation ([place])   . CVA (cerebral vascular accident) ([place])    left pontine and frontal lobe  . Diabetes mellitus    type 2  . Diabetes mellitus out of control [facility]) 06/12/2009   [facility] 02/13/14 - Early cataracts OD, no diabetic retinopathy, f/u in 12 months; Dr. Claudean Kinds.   . Hypercholesteremia   . Hypertension   . Hypertension    Hypertensive urgency 05/2006  . TIA (transient ischemic attack) 2017    Patient Active Problem List   Diagnosis Date Noted  . Diabetic neuropathy ([place]) 06/21/2018  . Chronic low back pain 06/21/2018  . Near sighted, bilateral 09/22/2017  . Acute ischemic stroke ([place])   . DM (diabetes mellitus) with complications ([place])   . Mild obstructive sleep apnea  11/18/2013  . Obese 10/22/2012  . Long term (current) use of anticoagulants 09/14/2010  . CARPAL TUNNEL SYNDROME, LEFT 02/19/2010  . Atrial fibrillation ([place]) 06/07/2009  . Depression 09/29/2008  . Hyperlipidemia associated with type 2 diabetes mellitus ([place]) 08/22/2008  . CHRONIC KIDNEY DISEASE STAGE II (MILD) 08/01/2008  . STRESS INCONTINENCE 07/31/2008  . Essential hypertension 04/16/2007    Past Surgical History:  Procedure Laterality Date  . ARTERY BIOPSY Right 09/18/2017   Procedure: BIOPSY OF RIGHT TEMPORAL ARTERY;  Surgeon: Coralie Keens, MD;  Location: [facility];  Service: General;  Laterality: Right;  . DILATION AND CURETTAGE OF UTERUS  1976  . MENISCUS REPAIR  03/09   right knee  . TONSILECTOMY, ADENOIDECTOMY, BILATERAL MYRINGOTOMY AND TUBES  age 6  . TONSILLECTOMY       OB History   No obstetric history on file.      Home Medications    Prior to Admission medications   Medication Sig Start Date End Date Taking? Authorizing Provider  amLODipine (NORVASC) 5 MG tablet Take 1 tablet (5 mg total) by mouth daily. 11/09/18  Yes Leeanne Rio, MD  gabapentin (NEURONTIN) 300 MG capsule TAKE 1 CAPSULE(300 MG) BY MOUTH THREE TIMES DAILY Patient taking differently: Take 300 mg by mouth 3 (three) times daily.  01/03/19  Yes [place] Bing, DO  insulin aspart (NOVOLOG) 100 UNIT/ML injection INJECT 20 UNITS UNDER THE SKIN  WITH BREAKFAST AND 30 UNITS WITH LUNCH Patient taking differently: Inject 10-33 Units into the skin See admin instructions. INJECT 33 UNITS UNDER THE SKIN WITH BREAKFAST AND 10 UNITS WITH LUNCH 11/23/18  Yes [place] Bing, DO  Insulin Glargine (LANTUS SOLOSTAR) 100 UNIT/ML Solostar Pen Inject 55 Units into the skin daily at 10 pm. Patient taking differently: Inject 63 Units into the skin daily at 10 pm.  11/23/18  Yes [place] Bing, DO  liraglutide (VICTOZA) 18 MG/3ML SOPN Inject 0.3 mLs (1.8 mg total) into the skin daily. 11/19/17  Yes Verner Mould, MD  lisinopril (ZESTRIL) 40 MG tablet Take 40 mg by mouth daily.   Yes [provider]  rivaroxaban (XARELTO) 20 MG TABS tablet Take 1 tablet (20 mg total) by mouth daily with supper. 06/21/18  Yes Leeanne Rio, MD  Blood Glucose Monitoring Suppl (ONETOUCH VERIO) w/Device KIT Check blood sugar 3 times per day. 06/21/18   Leeanne Rio, MD  glucose blood [facility] VERIO) test strip Check blood sugar 3 times daily 06/21/18   Leeanne Rio, MD  [facility] DELICA LANCETS 94B MISC Check blood sugar 3 times daily 06/21/18   Leeanne Rio, MD    Family History Family History  Problem Relation Age of Onset  . Heart failure Mother   . Diabetes Father   . Hypertension Father   . Lung cancer Sister   . Stroke Brother     Social History Social History   Tobacco Use  . Smoking status: Never Smoker  . Smokeless tobacco: Never Used  Substance Use Topics  . Alcohol use: No  . Drug use: No     Allergies   Propoxyphene n-acetaminophen, Glipizide, and Metformin and related   Review of Systems Review of Systems  Constitutional: Negative for fever.  Respiratory: Negative for cough and shortness of breath.   Cardiovascular: Positive for chest pain.  Gastrointestinal: Negative for abdominal pain, nausea and vomiting.  Genitourinary: Negative for dysuria.  Neurological: Negative for syncope.  All other systems reviewed and are negative.    Physical Exam Updated Vital Signs BP (!) 153/94 (BP Location: Left Arm)   Pulse 78   Temp 98.1 F (36.7 C) (Oral)   Resp (!) 24   SpO2 98%   Physical Exam Vitals signs and nursing note reviewed.  Constitutional:      General: She is not in acute distress.    Appearance: She is well-developed. She is obese. She is not ill-appearing.  HENT:     Head: Normocephalic and atraumatic.  Eyes:     General: No scleral icterus.       Right eye: No discharge.        Left eye: No discharge.      Conjunctiva/sclera: Conjunctivae normal.     Pupils: Pupils are equal, round, and reactive to light.  Neck:     Musculoskeletal: Normal range of motion.  Cardiovascular:     Rate and Rhythm: Normal rate and regular rhythm.  Pulmonary:     Effort: Pulmonary effort is normal. No respiratory distress.     Breath sounds: Normal breath sounds.  Chest:     Chest wall: Tenderness (pain with palpation of left chest wall. Pain is reproduced with L shoulder movement) present.  Abdominal:     General: There is no distension.     Palpations: Abdomen is soft.     Tenderness: There is no abdominal tenderness.  Musculoskeletal:     Right lower  leg: No edema.     Left lower leg: No edema.  Skin:    General: Skin is warm and dry.  Neurological:     Mental Status: She is alert and oriented to person, place, and time.  Psychiatric:        Behavior: Behavior normal.      ED Treatments / Results  Labs (all labs ordered are listed, but only abnormal results are displayed) Labs Reviewed  BASIC METABOLIC PANEL - Abnormal; Notable for the following components:      Result Value   CO2 20 (*)    Glucose, Bld 357 (*)    Creatinine, Ser 1.05 (*)    GFR calc non Af Amer 56 (*)    All other components within normal limits  CBC - Abnormal; Notable for the following components:   RBC 6.08 (*)    MCV 70.9 (*)    MCH 21.5 (*)    RDW 15.7 (*)    All other components within normal limits  TROPONIN I  TROPONIN I    EKG None  Radiology Dg Chest 2 View  Result Date: 01/19/2019 CLINICAL DATA:  Left-sided chest pain EXAM: CHEST - 2 VIEW COMPARISON:  October 05, 2016 FINDINGS: The heart size is normal. There are advanced degenerative changes of both glenohumeral joints. No pneumothorax. No large pleural effusion. No acute osseous abnormality. IMPRESSION: No active cardiopulmonary disease. Electronically Signed   By: Constance Holster [name]   On: 01/19/2019 18:28    Procedures Procedures (including  critical care time)  Medications Ordered in ED Medications  sodium chloride flush (NS) 0.9 % injection 3 mL (has no administration in time range)  HYDROcodone-acetaminophen (NORCO/VICODIN) 5-325 MG per tablet 1 tablet (1 tablet Oral Given 01/19/19 1939)     Initial Impression / Assessment and Plan / ED Course  I have reviewed the triage vital signs and the nursing notes.  Pertinent labs & imaging results that were available during my care of the patient were reviewed by me and considered in my medical decision making (see chart for details).  63 year old female presents with acute left sided chest pain while riding on a motorized cart. Pain is worse with movement and palpation of the area. Chest pain is atypical for ACS and sounds MSK. Chest pain work up is reassuring. Doubt ACS, PE, pericarditis, esophageal rupture, tension pneumothorax, aortic dissection, cardiac tamponade. EKG is SR. CXR is negative. Initial and second troponin are 0. Labs are remarkable for hyperglycemia but are otherwise normal. No hx of cardiac disease.  HEART score is 3. Will have her f/u with her PCP. She was given medicine for pain and muscle relaxer. She states the pain is still there but better. Return precautions given.   Final Clinical Impressions(s) / ED Diagnoses   Final diagnoses:  Chest wall pain  Hyperglycemia    ED Discharge Orders    None       Iris Pert 01/19/19 2112    Daleen Bo, MD 01/20/19 1325

## 2019-01-19 NOTE — ED Triage Notes (Signed)
Pt was out shopping today when she suddenly developed chest pain 8/10 pt states she was just riding around on a cart.

## 2019-01-19 NOTE — Discharge Instructions (Signed)
Follow up with the family medicine clinic Take Tylenol as needed for mild to moderate pain Take Flexeril for muscle pain/spasms Take tramadol for moderate-severe pain Use a heating pad on the area Please return if worsening

## 2019-01-24 ENCOUNTER — Other Ambulatory Visit: Payer: Self-pay

## 2019-01-26 MED ORDER — RIVAROXABAN 20 MG PO TABS: 20.0000 mg | ORAL_TABLET | Freq: Every day | ORAL | 3 refills | Status: DC

## 2019-03-24 ENCOUNTER — Telehealth: Payer: Self-pay

## 2019-03-24 NOTE — Telephone Encounter (Signed)
Attempted to contact pt for in office visit, pt did not answer.

## 2019-04-12 ENCOUNTER — Other Ambulatory Visit: Payer: Self-pay | Admitting: Family Medicine

## 2019-04-12 DIAGNOSIS — Z1231 Encounter for screening mammogram for malignant neoplasm of breast: Secondary | ICD-10-CM

## 2019-04-28 ENCOUNTER — Other Ambulatory Visit: Payer: Self-pay | Admitting: Family Medicine

## 2019-05-16 ENCOUNTER — Emergency Department (HOSPITAL_COMMUNITY)
Admission: EM | Admit: 2019-05-16 | Discharge: 2019-05-16 | Disposition: A | Discharge status: Home / Self Care | Payer: Medicare HMO | Attending: Emergency Medicine | Admitting: Emergency Medicine

## 2019-05-16 DIAGNOSIS — N182 Chronic kidney disease, stage 2 (mild): Secondary | ICD-10-CM | POA: Diagnosis not present

## 2019-05-16 DIAGNOSIS — Z79899 Other long term (current) drug therapy: Secondary | ICD-10-CM | POA: Insufficient documentation

## 2019-05-16 DIAGNOSIS — R040 Epistaxis: Secondary | ICD-10-CM | POA: Diagnosis not present

## 2019-05-16 DIAGNOSIS — Z8673 Personal history of transient ischemic attack (TIA), and cerebral infarction without residual deficits: Secondary | ICD-10-CM | POA: Diagnosis not present

## 2019-05-16 DIAGNOSIS — E1122 Type 2 diabetes mellitus with diabetic chronic kidney disease: Secondary | ICD-10-CM | POA: Insufficient documentation

## 2019-05-16 DIAGNOSIS — I4891 Unspecified atrial fibrillation: Secondary | ICD-10-CM | POA: Insufficient documentation

## 2019-05-16 DIAGNOSIS — Z794 Long term (current) use of insulin: Secondary | ICD-10-CM | POA: Diagnosis not present

## 2019-05-16 DIAGNOSIS — I129 Hypertensive chronic kidney disease with stage 1 through stage 4 chronic kidney disease, or unspecified chronic kidney disease: Secondary | ICD-10-CM | POA: Diagnosis not present

## 2019-05-16 LAB — CBC
HCT: 42 % (ref 36.0–46.0)
Hemoglobin: 12.8 g/dL (ref 12.0–15.0)
MCH: 22.7 pg — ABNORMAL LOW (ref 26.0–34.0)
MCHC: 30.5 g/dL (ref 30.0–36.0)
MCV: 74.3 fL — ABNORMAL LOW (ref 80.0–100.0)
Platelets: 340 10*3/uL (ref 150–400)
RBC: 5.65 MIL/uL — ABNORMAL HIGH (ref 3.87–5.11)
RDW: 14.7 % (ref 11.5–15.5)
WBC: 7.7 10*3/uL (ref 4.0–10.5)
nRBC: 0 % (ref 0.0–0.2)

## 2019-05-16 LAB — PROTIME-INR
INR: 1.8 — ABNORMAL HIGH (ref 0.8–1.2)
Prothrombin Time: 20.3 seconds — ABNORMAL HIGH (ref 11.4–15.2)

## 2019-05-16 NOTE — ED Provider Notes (Signed)
[place] EMERGENCY DEPARTMENT Provider Note   CSN: [id number] Arrival date & time: 05/16/19  1314     History   Chief Complaint Chief Complaint  Patient presents with   Epistaxis    HPI [name] is a 63 y.o. female.     HPI   63 year old female presents today with complaints of epistaxis.  Patient notes a history of intermittent A. Fib currently on Xarelto.  She notes she is not in A. fib presently.  She notes at 11 AM this morning she had small amount of blood out of her right nostril.  She applied direct pressure which stopped the bleeding within 10 minutes.  She notes very minimal blood loss.  She has not had any repeat bleeds.  She denies any trauma to the nose.  No other abnormal bruising or bleeding     Past Medical History:  Diagnosis Date   Atrial fibrillation [facility])    CVA (cerebral vascular accident) ([place])    left pontine and frontal lobe   Diabetes mellitus    type 2   Diabetes mellitus out of control ([place]) 06/12/2009   [facility] 02/13/14 - Early cataracts OD, no diabetic retinopathy, f/u in 12 months; Dr. Claudean Kinds.    Hypercholesteremia    Hypertension    Hypertension    Hypertensive urgency 05/2006   TIA (transient ischemic attack) 2017    Patient Active Problem List   Diagnosis Date Noted   Diabetic neuropathy ([place]) 06/21/2018   Chronic low back pain 06/21/2018   Near sighted, bilateral 09/22/2017   Acute ischemic stroke ([place])    DM (diabetes mellitus) with complications ([place])    Mild obstructive sleep apnea 11/18/2013   Obese 10/22/2012   Long term (current) use of anticoagulants 09/14/2010   CARPAL TUNNEL SYNDROME, LEFT 02/19/2010   Atrial fibrillation ([place]) 06/07/2009   Depression 09/29/2008   Hyperlipidemia associated with type 2 diabetes mellitus ([place]) 08/22/2008   CHRONIC KIDNEY DISEASE STAGE II (M[place]) 08/01/2008   STRESS INCONTINENCE 07/31/2008   Essential hypertension  04/16/2007    Past Surgical History:  Procedure Laterality Date   ARTERY BIOPSY Right 09/18/2017   Procedure: BIOPSY OF RIGHT TEMPORAL ARTERY;  Surgeon: Coralie Keens, MD;  Location: [facility];  Service: General;  Laterality: Right;   [place]  03/09   right knee   TONSILECTOMY, ADENOIDECTOMY, BILATERAL MYRINGOTOMY AND TUBES  age 77   TONSILLECTOMY       OB History   No obstetric history on file.      Home Medications    Prior to Admission medications   Medication Sig Start Date End Date Taking? Authorizing Provider  amLODipine (NORVASC) 5 MG tablet TAKE 1 TABLET(5 MG) BY MOUTH DAILY 04/28/19   Mullis, Kiersten P, DO  Blood Glucose Monitoring Suppl (ONETOUCH VERIO) w/Device KIT Check blood sugar 3 times per day. 06/21/18   Leeanne Rio, MD  cyclobenzaprine (FLEXERIL) 10 MG tablet Take 1 tablet (10 mg total) by mouth 2 (two) times daily as needed for muscle spasms. 01/19/19   Recardo Evangelist, PA-C  gabapentin (NEURONTIN) 300 MG capsule TAKE 1 CAPSULE(300 MG) BY MOUTH THREE TIMES DAILY Patient taking differently: Take 300 mg by mouth 3 (three) times daily.  01/03/19    Bing, DO  glucose blood [facility] VERIO) test strip Check blood sugar 3 times daily 06/21/18   Leeanne Rio, MD  insulin aspart (NOVOLOG) 100 UNIT/ML  injection INJECT 20 UNITS UNDER THE SKIN WITH BREAKFAST AND 30 UNITS WITH LUNCH Patient taking differently: Inject 10-33 Units into the skin See admin instructions. INJECT 33 UNITS UNDER THE SKIN WITH BREAKFAST AND 10 UNITS WITH LUNCH 11/23/18   [place] Bing, DO  Insulin Glargine (LANTUS SOLOSTAR) 100 UNIT/ML Solostar Pen Inject 55 Units into the skin daily at 10 pm. Patient taking differently: Inject 63 Units into the skin daily at 10 pm.  11/23/18   [place] Bing, DO  liraglutide (VICTOZA) 18 MG/3ML SOPN Inject 0.3 mLs (1.8 mg total) into the skin daily. 11/19/17   Verner Mould, MD  lisinopril (ZESTRIL) 40 MG tablet Take 40 mg by mouth daily.    [provider]  [facility] LANCETS 70J MISC Check blood sugar 3 times daily 06/21/18   Leeanne Rio, MD  rivaroxaban (XARELTO) 20 MG TABS tablet Take 1 tablet (20 mg total) by mouth daily with supper. 01/26/19   [place] Bing, DO  traMADol (ULTRAM) 50 MG tablet Take 1 tablet (50 mg total) by mouth every 6 (six) hours as needed. 01/19/19   Recardo Evangelist, PA-C  amLODipine (NORVASC) 5 MG tablet Take 1 tablet (5 mg total) by mouth daily. 11/09/18   Leeanne Rio, MD    Family History Family History  Problem Relation Age of Onset   Heart failure Mother    Diabetes Father    Hypertension Father    Lung cancer Sister    Stroke Brother     Social History Social History   Tobacco Use   Smoking status: Never Smoker   Smokeless tobacco: Never Used  Substance Use Topics   Alcohol use: No   Drug use: No     Allergies   Propoxyphene n-acetaminophen, Glipizide, and Metformin and related   Review of Systems Review of Systems  All other systems reviewed and are negative.   Physical Exam Updated Vital Signs BP (!) 149/96    Pulse 80    Temp 98.1 F (36.7 C) (Oral)    Resp 16    SpO2 100%   Physical Exam Vitals signs and nursing note reviewed.  Constitutional:      Appearance: She is well-developed.  HENT:     Head: Normocephalic and atraumatic.     Nose:     Comments: Small lesion noted in the right internal ala region-no bleeding Eyes:     General: No scleral icterus.       Right eye: No discharge.        Left eye: No discharge.     Conjunctiva/sclera: Conjunctivae normal.     Pupils: Pupils are equal, round, and reactive to light.  Neck:     Musculoskeletal: Normal range of motion.     Vascular: No JVD.     Trachea: No tracheal deviation.  Pulmonary:     Effort: Pulmonary effort is normal.     Breath sounds: No stridor.  Neurological:     Mental  Status: She is alert and oriented to person, place, and time.     Coordination: Coordination normal.  Psychiatric:        Behavior: Behavior normal.        Thought Content: Thought content normal.        Judgment: Judgment normal.     ED Treatments / Results  Labs (all labs ordered are listed, but only abnormal results are displayed) Labs Reviewed  PROTIME-INR - Abnormal; Notable for the following components:  Result Value   Prothrombin Time 20.3 (*)    INR 1.8 (*)    All other components within normal limits  CBC - Abnormal; Notable for the following components:   RBC 5.65 (*)    MCV 74.3 (*)    MCH 22.7 (*)    All other components within normal limits    EKG None  Radiology No results found.  Procedures Procedures (including critical care time)  Medications Ordered in ED Medications - No data to display   Initial Impression / Assessment and Plan / ED Course  I have reviewed the triage vital signs and the nursing notes.  Pertinent labs & imaging results that were available during my care of the patient were reviewed by me and considered in my medical decision making (see chart for details).  Clinical Course as of May 16 1820  Mon May 16, 2019  1637 INR(!): 1.8 [AW]    Clinical Course User Index [AW] Burt Ek      Labs:   Imaging:  Consults:  Therapeutics:  Discharge Meds:   Assessment/Plan: 63 year old female presents today with nosebleed.  This appeared to be an uncomplicated bleed.  She has no signs of hemodynamic instability.  No active bleed presently.  No need for further evaluation or management this time.  Strict return precautions given.  Verbalized understanding and agreement to today's plan.   Final Clinical Impressions(s) / ED Diagnoses   Final diagnoses:  Epistaxis    ED Discharge Orders    None       Francee Gentile 05/17/19 1821    Hayden Rasmussen, MD 05/18/19 1003

## 2019-05-16 NOTE — Discharge Instructions (Addendum)
Please read attached information. If you experience any new or worsening signs or symptoms please return to the emergency room for evaluation.  °

## 2019-05-16 NOTE — ED Triage Notes (Signed)
Pt reports nosebleed this morning. On blood thinners. Bleeding controlled at present. NAD

## 2019-05-16 NOTE — ED Notes (Signed)
Pt called to be roomed, no response 

## 2019-05-24 ENCOUNTER — Other Ambulatory Visit: Payer: Self-pay

## 2019-05-24 ENCOUNTER — Ambulatory Visit
Admission: RE | Admit: 2019-05-24 | Discharge: 2019-05-24 | Disposition: A | Discharge status: Home / Self Care | Payer: Medicare HMO | Source: Ambulatory Visit | Attending: Internal Medicine | Admitting: Internal Medicine

## 2019-05-24 DIAGNOSIS — Z1231 Encounter for screening mammogram for malignant neoplasm of breast: Secondary | ICD-10-CM

## 2019-05-25 ENCOUNTER — Other Ambulatory Visit: Payer: Self-pay

## 2019-05-26 ENCOUNTER — Ambulatory Visit: Payer: Medicare HMO

## 2019-06-02 ENCOUNTER — Other Ambulatory Visit: Payer: Self-pay

## 2019-06-02 ENCOUNTER — Ambulatory Visit (INDEPENDENT_AMBULATORY_CARE_PROVIDER_SITE_OTHER): Payer: Medicare HMO

## 2019-06-02 VITALS — BP 138/80 | HR 98 | Temp 97.9°F | Ht 62.0 in | Wt 240.0 lb

## 2019-06-02 DIAGNOSIS — Z0000 Encounter for general adult medical examination without abnormal findings: Secondary | ICD-10-CM | POA: Diagnosis not present

## 2019-06-02 NOTE — Progress Notes (Addendum)
Subjective:   [name] is a 63 y.o. female who presents for Medicare Annual (Subsequent) preventive examination.  Review of Systems: Defer to PCP  Cardiac Risk Factors include: advanced age (>103mn, >>75women);diabetes mellitus;hypertension;obesity (BMI >30kg/m2)  Objective:   Vitals: BP 138/80   Pulse 98   Temp 97.9 F (36.6 C) (Oral)   Ht _0  (1.575 m)   Wt 240 lb (108.9 kg)   SpO2 92%   BMI 43.90 kg/m   Body mass index is 43.9 kg/m.  Advanced Directives 06/02/2019 06/21/2018 09/22/2017 09/16/2017 09/15/2017 12/09/2016 11/18/2016  Does Patient Have a Medical Advance Directive? No No No - No No No  Type of Advance Directive - - - - - - -  Would patient like information on creating a medical advance directive? Yes (MAU/Ambulatory/Procedural Areas - Information given) No - Patient declined No - Patient declined No - Patient declined - - Yes (ED - Information included in AVS)  Pre-existing out of facility DNR order (yellow form or pink MOST form) - - - - - - -   Tobacco Social History   Tobacco Use  Smoking Status Never Smoker  Smokeless Tobacco Never Used     Clinical Intake:  Pre-visit preparation completed: Yes  Pain Score: 7   How often do you need to have someone help you when you read instructions, pamphlets, or other written materials from your doctor or pharmacy?: 2 - Rarely What is the last grade level you completed in school?: high school  Interpreter Needed?: No  Past Medical History:  Diagnosis Date  . Atrial fibrillation ([facility]   . [place]    left pontine and frontal lobe  . Diabetes mellitus    type 2  . Diabetes mellitus out of control [facility] 06/12/2009   [facility] Early cataracts OD, no diabetic retinopathy, f/u in 12 months; Dr. HClaudean Kinds   . Hypercholesteremia   . Hypertension   . Hypertension    Hypertensive urgency 05/2006  . TIA (transient ischemic attack) 2017   Past Surgical  History:  Procedure Laterality Date  . ARTERY BIOPSY Right 09/18/2017   Procedure: BIOPSY OF RIGHT TEMPORAL ARTERY;  Surgeon: BCoralie Keens MD;  [facility]  Service: General;  Laterality: Right;  . DILATION AND CURETTAGE OF UTERUS  1976  . MENISCUS REPAIR  03/09   right knee  . TONSILECTOMY, ADENOIDECTOMY, BILATERAL MYRINGOTOMY AND TUBES  age 63 . TONSILLECTOMY     Family History  Problem Relation Age of Onset  . Heart failure Mother   . Diabetes Father   . Hypertension Father   . Cancer Father   . Lung cancer Sister   . Stroke Brother   . Diabetes Brother   . Stroke Maternal Grandmother   . Heart disease Maternal Grandfather    Social History   Socioeconomic History  . Marital status: Divorced    Spouse name: Not on file  . Number of children: 3  . Years of education: 113 . Highest education level: High school graduate  Occupational History  . Occupation: Retired    Comment: [employer] . Financial resource strain: Not very hard  . Food insecurity    Worry: Sometimes true    Inability: Sometimes true  . Transportation needs    Medical: No    Non-medical: No  Tobacco Use  . Smoking status: Never Smoker  . Smokeless tobacco: Never Used  Substance and Sexual Activity  . Alcohol use: No  . Drug use: No  . Sexual activity: Not Currently  Lifestyle  . Physical activity    Days per week: 0 days    Minutes per session: 0 min  . Stress: Only a little  Relationships  . Social connections    Talks on phone: More than three times a week    Gets together: Once a week    Attends religious service: More than 4 times per year    Active member of club or organization: Yes    Attends meetings of clubs or organizations: More than 4 times per year    Relationship status: Divorced  Other Topics Concern  . Not on file  Social History Narrative   Patient lives in [place] with her daughter and grandchildren.   Patient does not drive, daughter takes  her to apts and shopping.    Patient enjoys playing mind games on her phone and watching TV.     Outpatient Encounter Medications as of 06/02/2019  Medication Sig  . amLODipine (NORVASC) 5 MG tablet TAKE 1 TABLET(5 MG) BY MOUTH DAILY  . Blood Glucose Monitoring Suppl (ONETOUCH VERIO) w/Device KIT Check blood sugar 3 times per day.  . gabapentin (NEURONTIN) 300 MG capsule TAKE 1 CAPSULE(300 MG) BY MOUTH THREE TIMES DAILY (Patient taking differently: Take 300 mg by mouth 3 (three) times daily. )  . glucose blood (ONETOUCH VERIO) test strip Check blood sugar 3 times daily  . insulin aspart (NOVOLOG) 100 UNIT/ML injection INJECT 20 UNITS UNDER THE SKIN WITH BREAKFAST AND 30 UNITS WITH LUNCH (Patient taking differently: Inject 10-33 Units into the skin See admin instructions. INJECT 33 UNITS UNDER THE SKIN WITH BREAKFAST AND 10 UNITS WITH LUNCH)  . Insulin Glargine (LANTUS SOLOSTAR) 100 UNIT/ML Solostar Pen Inject 55 Units into the skin daily at 10 pm. (Patient taking differently: Inject 63 Units into the skin daily at 10 pm. )  . liraglutide (VICTOZA) 18 MG/3ML SOPN Inject 0.3 mLs (1.8 mg total) into the skin daily.  Marland Kitchen lisinopril (ZESTRIL) 40 MG tablet Take 40 mg by mouth daily.  Glory Rosebush DELICA LANCETS 10F MISC Check blood sugar 3 times daily  . rivaroxaban (XARELTO) 20 MG TABS tablet Take 1 tablet (20 mg total) by mouth daily with supper.  . cyclobenzaprine (FLEXERIL) 10 MG tablet Take 1 tablet (10 mg total) by mouth 2 (two) times daily as needed for muscle spasms. (Patient not taking: Reported on 06/02/2019)  . traMADol (ULTRAM) 50 MG tablet Take 1 tablet (50 mg total) by mouth every 6 (six) hours as needed. (Patient not taking: Reported on 06/02/2019)  . [DISCONTINUED] amLODipine (NORVASC) 5 MG tablet Take 1 tablet (5 mg total) by mouth daily.   No facility-administered encounter medications on file as of 06/02/2019.    Activities of Daily Living In your present state of health, do you have  any difficulty performing the following activities: 06/06/2019 06/02/2019  Hearing? - N  Vision? - N  Difficulty concentrating or making decisions? - N  Walking or climbing stairs? - Y  Dressing or bathing? - Y  Comment - daughter helps when needed  Doing errands, shopping? - N  Conservation officer, nature and eating ? - N  Using the Toilet? - N  In the past six months, have you accidently leaked urine? (No Data) Y  Comment depends given to patient -  Do you have problems with loss of bowel control? - N  Managing your  Medications? - N  Managing your Finances? - N  Housekeeping or managing your Housekeeping? - N  Some recent data might be hidden   Patient Care Team: Danna Hefty, DO as PCP - General (Family Medicine)    Assessment:   This is a routine wellness examination for [name].  Exercise Activities and Dietary recommendations Current Exercise Habits: The patient does not participate in regular exercise at present, Exercise limited by: orthopedic condition(s)  Goals    . Exercise 30 mins a time 3x a day     Walk 15 minutes in AM and 15 minutes PM      Fall Risk Fall Risk  06/02/2019 06/21/2018 03/06/2017 11/04/2016 04/23/2016  Falls in the past year? 1 1 Yes Yes No  Number falls in past yr: _0 or more -  Injury with Fall? 0 0 No No -  Risk for fall due to : Impaired vision - Impaired balance/gait - -  Follow up Falls prevention discussed;Education provided - - - -   Is the patient's home free of loose throw rugs in walkways, pet beds, electrical cords, etc?   yes      Grab bars in the bathroom? no      Handrails on the stairs?   no      Adequate lighting?   yes  Patient rating of health (0-10) scale: 5   Depression Screen PHQ 2/9 Scores 06/02/2019 09/22/2017 12/09/2016 11/04/2016  PHQ - 2 Score 2 0 2 2  PHQ- 9 Score - - - -    Cognitive Function  Immunization History  Administered Date(s) Administered  . Influenza Split 05/04/2012  . Influenza Whole 07/30/2010  .  Influenza,inj,Quad PF,6+ Mos 09/06/2013, 06/11/2015, 04/21/2016, 09/22/2017, 06/21/2018  . Influenza-Unspecified 04/25/2019  . Pneumococcal Polysaccharide-23 05/27/2011  . Td 11/05/2009   Screening Tests Health Maintenance  Topic Date Due  . Hepatitis C Screening  15-May-1956  . PAP SMEAR-Modifier  01/21/2013  . COLONOSCOPY  06/05/2015  . OPHTHALMOLOGY EXAM  10/07/2018  . HEMOGLOBIN A1C  12/20/2018  . FOOT EXAM  06/22/2019  . TETANUS/TDAP  11/06/2019  . MAMMOGRAM  05/23/2021  . INFLUENZA VACCINE  Completed  . HIV Screening  Completed    Cancer Screenings: Lung: Low Dose CT Chest recommended if Age 53-80 years, 30 pack-year currently smoking OR have quit w/in 15years. Patient does not qualify. Breast:  Up to date on Mammogram? Yes   Up to date of Bone Density/Dexa? Will obtain Summer 2021 Colorectal: Due- handout given  Additional Screenings: HIV Screening: Completed   Plan:  Fill out the advance directive packet and bring back to our office for processing.  You have an apt with your PCP, December 7th at 3:10pm. Consider getting a colonoscopy. Call the office if you need anymore depends. Starting walking outside!  I have personally reviewed and noted the following in the patient's chart:   . Medical and social history . Use of alcohol, tobacco or illicit drugs  . Current medications and supplements . Functional ability and status . Nutritional status . Physical activity . Advanced directives . List of other physicians . Hospitalizations, surgeries, and ER visits in previous 12 months . Vitals . Screenings to include cognitive, depression, and falls . Referrals and appointments  In addition, I have reviewed and discussed with patient certain preventive protocols, quality metrics, and best practice recommendations. A written personalized care plan for preventive services as well as general preventive health recommendations were provided to patient.  Dorna Bloom, [place]   06/06/2019    I have reviewed this visit and agree with the documentation.   Mina Marble, DO  [facility], PGY2 06/06/2019

## 2019-06-06 NOTE — Patient Instructions (Addendum)
You spoke to [name], [place] for your annual wellness visit.  We discussed goals: Goals     Exercise 30 mins a time 3x a day     Walk 15 minutes in AM and 15 minutes PM      We also discussed recommended health maintenance. As discussed, you are due for the following. We have scheduled you an apt with your PCP for December 7th.   Health Maintenance  Topic Date Due   Hepatitis C Screening  1955/11/14   PAP SMEAR-Modifier  01/21/2013   COLONOSCOPY  06/05/2015   OPHTHALMOLOGY EXAM  10/07/2018   HEMOGLOBIN A1C  12/20/2018   FOOT EXAM  06/22/2019   TETANUS/TDAP  11/06/2019   MAMMOGRAM  05/23/2021   INFLUENZA VACCINE  Completed   HIV Screening  Completed   Fill out the advance directive packet and bring back to our office for processing.  You have an apt with your PCP, December 7th at 3:10pm. Consider getting a colonoscopy. Call the office if you need anymore depends. Starting walking outside!  Colonoscopy, Adult A colonoscopy is an exam to look at the entire large intestine. During the exam, a lubricated, flexible tube that has a camera on the end of it is inserted into the anus and then passed into the rectum, colon, and other parts of the large intestine. You may have a colonoscopy as a part of normal colorectal screening or if you have certain symptoms, such as:  Lack of red blood cells (anemia).  Diarrhea that does not go away.  Abdominal pain.  Blood in your stool (feces). A colonoscopy can help screen for and diagnose medical problems, including:  Tumors.  Polyps.  Inflammation.  Areas of bleeding. Tell a health care provider about:  Any allergies you have.  All medicines you are taking, including vitamins, herbs, eye drops, creams, and over-the-counter medicines.  Any problems you or family members have had with anesthetic medicines.  Any blood disorders you have.  Any surgeries you have had.  Any medical conditions you have.  Any  problems you have had passing stool. What are the risks? Generally, this is a safe procedure. However, problems may occur, including:  Bleeding.  A tear in the intestine.  A reaction to medicines given during the exam.  Infection (rare). What happens before the procedure? Eating and drinking restrictions Follow instructions from your health care provider about eating and drinking, which may include:  A few days before the procedure - follow a low-fiber diet. Avoid nuts, seeds, dried fruit, raw fruits, and vegetables.  1-3 days before the procedure - follow a clear liquid diet. Drink only clear liquids, such as clear broth or bouillon, black coffee or tea, clear juice, clear soft drinks or sports drinks, gelatin dessert, and popsicles. Avoid any liquids that contain red or purple dye.  On the day of the procedure - do not eat or drink anything starting 2 hours before the procedure, or within the time period that your health care provider recommends. Up to 2 hours before the procedure, you may continue to drink clear liquids, such as water or clear fruit juice. Bowel prep If you were prescribed an oral bowel prep to clean out your colon:  Take it as told by your health care provider. Starting the day before your procedure, you will need to drink a large amount of medicated liquid. The liquid will cause you to have multiple loose stools until your stool is almost clear or light green.  If your skin or anus gets irritated from diarrhea, you may use these to relieve the irritation: ? Medicated wipes, such as adult wet wipes with aloe and vitamin E. ? A skin-soothing product like petroleum jelly.  If you vomit while drinking the bowel prep, take a break for up to 60 minutes and then begin the bowel prep again. If vomiting continues and you cannot take the bowel prep without vomiting, call your health care provider.  To clean out your colon, you may also be given: ? Laxative  medicines. ? Instructions about how to use an enema. General instructions  Ask your health care provider about: ? Changing or stopping your regular medicines or supplements. This is especially important if you are taking iron supplements, diabetes medicines, or blood thinners. ? Taking medicines such as aspirin and ibuprofen. These medicines can thin your blood. Do not take these medicines before the procedure if your health care provider tells you not to.  Plan to have someone take you home from the hospital or clinic. What happens during the procedure?   An IV may be inserted into one of your veins.  You will be given medicine to help you relax (sedative).  To reduce your risk of infection: ? Your health care team will wash or sanitize their hands. ? Your anal area will be washed with soap.  You will be asked to lie on your side with your knees bent.  Your health care provider will lubricate a long, thin, flexible tube. The tube will have a camera and a light on the end.  The tube will be inserted into your anus.  The tube will be gently eased through your rectum and colon.  Air will be delivered into your colon to keep it open. You may feel some pressure or cramping.  The camera will be used to take images during the procedure.  A small tissue sample may be removed to be examined under a microscope (biopsy).  If small polyps are found, your health care provider may remove them and have them checked for cancer cells.  When the exam is done, the tube will be removed. The procedure may vary among health care providers and hospitals. What happens after the procedure?  Your blood pressure, heart rate, breathing rate, and blood oxygen level will be monitored until the medicines you were given have worn off.  Do not drive for 24 hours after the exam.  You may have a small amount of blood in your stool.  You may pass gas and have mild abdominal cramping or bloating due to the  air that was used to inflate your colon during the exam.  It is up to you to get the results of your procedure. Ask your health care provider, or the department performing the procedure, when your results will be ready. Summary  A colonoscopy is an exam to look at the entire large intestine.  During a colonoscopy, a lubricated, flexible tube with a camera on the end of it is inserted into the anus and then passed into the colon and other parts of the large intestine.  Follow instructions from your health care provider about eating and drinking before the procedure.  If you were prescribed an oral bowel prep to clean out your colon, take it as told by your health care provider.  After your procedure, your blood pressure, heart rate, breathing rate, and blood oxygen level will be monitored until the medicines you were given have worn off.  This information is not intended to replace advice given to you by your health care provider. Make sure you discuss any questions you have with your health care provider. Document Released: 07/18/2000 Document Revised: 05/13/2017 Document Reviewed: 10/02/2015 Elsevier Patient Education  2020 [place] Maintenance, Female Adopting a healthy lifestyle and getting preventive care are important in promoting health and wellness. Ask your health care provider about:  The right schedule for you to have regular tests and exams.  Things you can do on your own to prevent diseases and keep yourself healthy. What should I know about diet, weight, and exercise? Eat a healthy diet   Eat a diet that includes plenty of vegetables, fruits, low-fat dairy products, and lean protein.  Do not eat a lot of foods that are high in solid fats, added sugars, or sodium. Maintain a healthy weight Body mass index (BMI) is used to identify weight problems. It estimates body fat based on height and weight. Your health care provider can help determine your BMI and help  you achieve or maintain a healthy weight. Get regular exercise Get regular exercise. This is one of the most important things you can do for your health. Most adults should:  Exercise for at least 150 minutes each week. The exercise should increase your heart rate and make you sweat (moderate-intensity exercise).  Do strengthening exercises at least twice a week. This is in addition to the moderate-intensity exercise.  Spend less time sitting. Even light physical activity can be beneficial. Watch cholesterol and blood lipids Have your blood tested for lipids and cholesterol at 64 years of age, then have this test every 5 years. Have your cholesterol levels checked more often if:  Your lipid or cholesterol levels are high.  You are older than 64 years of age.  You are at high risk for heart disease. What should I know about cancer screening? Depending on your health history and family history, you may need to have cancer screening at various ages. This may include screening for:  Breast cancer.  Cervical cancer.  Colorectal cancer.  Skin cancer.  Lung cancer. What should I know about heart disease, diabetes, and high blood pressure? Blood pressure and heart disease  High blood pressure causes heart disease and increases the risk of stroke. This is more likely to develop in people who have high blood pressure readings, are of African descent, or are overweight.  Have your blood pressure checked: ? Every 3-5 years if you are 65-80 years of age. ? Every year if you are 82 years old or older. Diabetes Have regular diabetes screenings. This checks your fasting blood sugar level. Have the screening done:  Once every three years after age 49 if you are at a normal weight and have a low risk for diabetes.  More often and at a younger age if you are overweight or have a high risk for diabetes. What should I know about preventing infection? Hepatitis B If you have a higher risk for  hepatitis B, you should be screened for this virus. Talk with your health care provider to find out if you are at risk for hepatitis B infection. Hepatitis C Testing is recommended for:  Everyone born from [birth year] through 1966.  Anyone with known risk factors for hepatitis C. Sexually transmitted infections (STIs)  Get screened for STIs, including gonorrhea and chlamydia, if: ? You are sexually active and are younger than 64 years of age. ? You are older than  64 years of age and your health care provider tells you that you are at risk for this type of infection. ? Your sexual activity has changed since you were last screened, and you are at increased risk for chlamydia or gonorrhea. Ask your health care provider if you are at risk.  Ask your health care provider about whether you are at high risk for HIV. Your health care provider may recommend a prescription medicine to help prevent HIV infection. If you choose to take medicine to prevent HIV, you should first get tested for HIV. You should then be tested every 3 months for as long as you are taking the medicine. Pregnancy  If you are about to stop having your period (premenopausal) and you may become pregnant, seek counseling before you get pregnant.  Take 400 to 800 micrograms (mcg) of folic acid every day if you become pregnant.  Ask for birth control (contraception) if you want to prevent pregnancy. Osteoporosis and menopause Osteoporosis is a disease in which the bones lose minerals and strength with aging. This can result in bone fractures. If you are 56 years old or older, or if you are at risk for osteoporosis and fractures, ask your health care provider if you should:  Be screened for bone loss.  Take a calcium or vitamin D supplement to lower your risk of fractures.  Be given hormone replacement therapy (HRT) to treat symptoms of menopause. Follow these instructions at home: Lifestyle  Do not use any products that contain  nicotine or tobacco, such as cigarettes, e-cigarettes, and chewing tobacco. If you need help quitting, ask your health care provider.  Do not use street drugs.  Do not share needles.  Ask your health care provider for help if you need support or information about quitting drugs. Alcohol use  Do not drink alcohol if: ? Your health care provider tells you not to drink. ? You are pregnant, may be pregnant, or are planning to become pregnant.  If you drink alcohol: ? Limit how much you use to 0-1 drink a day. ? Limit intake if you are breastfeeding.  Be aware of how much alcohol is in your drink. In the [place] one drink equals one 12 oz bottle of beer (355 mL), one 5 oz glass of wine (148 mL), or one 1 oz glass of hard liquor (44 mL). General instructions  Schedule regular health, dental, and eye exams.  Stay current with your vaccines.  Tell your health care provider if: ? You often feel depressed. ? You have ever been abused or do not feel safe at home. Summary  Adopting a healthy lifestyle and getting preventive care are important in promoting health and wellness.  Follow your health care provider's instructions about healthy diet, exercising, and getting tested or screened for diseases.  Follow your health care provider's instructions on monitoring your cholesterol and blood pressure. This information is not intended to replace advice given to you by your health care provider. Make sure you discuss any questions you have with your health care provider. Document Released: 02/03/2011 Document Revised: 07/14/2018 Document Reviewed: 07/14/2018 Elsevier Patient Education  2020 [place] clinic's number is [phone]. Please call with questions or concerns about what we discussed today.

## 2019-06-06 NOTE — Addendum Note (Signed)
Addended by: Danna Hefty on: 06/06/2019 04:38 PM   Modules accepted: Orders

## 2019-07-11 ENCOUNTER — Other Ambulatory Visit: Payer: Self-pay

## 2019-07-11 ENCOUNTER — Encounter: Payer: Self-pay | Admitting: Family Medicine

## 2019-07-11 ENCOUNTER — Ambulatory Visit (INDEPENDENT_AMBULATORY_CARE_PROVIDER_SITE_OTHER): Payer: Medicare HMO | Admitting: Family Medicine

## 2019-07-11 VITALS — BP 135/80 | HR 88 | Temp 97.9°F | Wt 243.0 lb

## 2019-07-11 DIAGNOSIS — Z1159 Encounter for screening for other viral diseases: Secondary | ICD-10-CM | POA: Diagnosis not present

## 2019-07-11 DIAGNOSIS — E785 Hyperlipidemia, unspecified: Secondary | ICD-10-CM | POA: Diagnosis not present

## 2019-07-11 DIAGNOSIS — B372 Candidiasis of skin and nail: Secondary | ICD-10-CM

## 2019-07-11 DIAGNOSIS — Z794 Long term (current) use of insulin: Secondary | ICD-10-CM | POA: Diagnosis not present

## 2019-07-11 DIAGNOSIS — E1142 Type 2 diabetes mellitus with diabetic polyneuropathy: Secondary | ICD-10-CM

## 2019-07-11 DIAGNOSIS — E1169 Type 2 diabetes mellitus with other specified complication: Secondary | ICD-10-CM | POA: Diagnosis not present

## 2019-07-11 DIAGNOSIS — E1165 Type 2 diabetes mellitus with hyperglycemia: Secondary | ICD-10-CM

## 2019-07-11 DIAGNOSIS — E118 Type 2 diabetes mellitus with unspecified complications: Secondary | ICD-10-CM

## 2019-07-11 DIAGNOSIS — I10 Essential (primary) hypertension: Secondary | ICD-10-CM

## 2019-07-11 DIAGNOSIS — IMO0002 Reserved for concepts with insufficient information to code with codable children: Secondary | ICD-10-CM

## 2019-07-11 DIAGNOSIS — Z6841 Body Mass Index (BMI) 40.0 and over, adult: Secondary | ICD-10-CM | POA: Diagnosis not present

## 2019-07-11 LAB — POCT GLYCOSYLATED HEMOGLOBIN (HGB A1C): Hemoglobin A1C: 13.7 % — AB (ref 4.0–5.6)

## 2019-07-11 MED ORDER — ONETOUCH VERIO W/DEVICE KIT: PACK | 0 refills | Status: DC

## 2019-07-11 MED ORDER — ONETOUCH DELICA LANCETS 33G MISC: 11 refills | Status: DC

## 2019-07-11 MED ORDER — GABAPENTIN 300 MG PO CAPS: 300.0000 mg | ORAL_CAPSULE | Freq: Three times a day (TID) | ORAL | 3 refills | Status: DC

## 2019-07-11 MED ORDER — NYSTATIN 100000 UNIT/GM EX POWD: Freq: Four times a day (QID) | CUTANEOUS | 5 refills | Status: DC | PRN

## 2019-07-11 MED ORDER — CANAGLIFLOZIN 300 MG PO TABS: 300.0000 mg | ORAL_TABLET | Freq: Every day | ORAL | 1 refills | Status: DC

## 2019-07-11 MED ORDER — ONETOUCH VERIO VI STRP: ORAL_STRIP | 12 refills | Status: DC

## 2019-07-11 MED ORDER — ATORVASTATIN CALCIUM 80 MG PO TABS: 80.0000 mg | ORAL_TABLET | Freq: Every day | ORAL | 3 refills | Status: DC

## 2019-07-11 NOTE — Patient Instructions (Addendum)
Thank you so much for coming in today. Today we discussed many things.  Diabetes: - Your Hemoglobin A1C (average blood sugars over 3 months) was higher at 13 today. - I have started you on a new medicine called Invokana (Canagliflozen) 300mg : Take every morning as prescribed. Please continue your insulin regimen as prescribed. - PLEASE check you blood sugar first thing every morning and 2 hours after breakfast AND lunch every day.  - Please keep a log of your blood sugars - I have called in all of your diabetes supplies. Please call the clinic if you EVER need any more supplies.  - I have you scheduled to follow up with me in 1 month via telemedicine to review and adjust your diabetes regimen - I have placed a referral for an eye doctor. They should call you to schedule this. Please let me know if you dont hear from them.   Cholesterol: I have restarted your cholesterol medicine. Please take the Lipitor every day as prescribed.   Blood Pressure: Please continue your blood pressure medicines (Lisinopril and Amlodipine).  I have obtained labs. I will call you if anything is abnormal.  I look forward to talking to you in 1 month!  Take care, Dr. Tarry Kos

## 2019-07-11 NOTE — Progress Notes (Signed)
Subjective:   Patient ID: [name]    DOB: 12/14/1955, 63 y.o. female   MRN: [id number]  [name] is a 63 y.o. female with a history of ischemic CVA, atrial fibrillation on xarelto, HTN, OSA, T2DM, HLD, chronic low back pain, depression here for diabetes follow up.  T2DM:  A1C today 13.7, last A1C 11. Currently taking Novolog 33U in the AM and 10U at lunch, Lantus 63U qHS, Liraglutide 1.74m QD. Endorses compliance. Patient has not been able to check her blood sugars because she lost her meter. Denies any hypoglycemic symptoms such as lightheadedness/dizziness. Endorses polyphagia, polyuria, and polydipsia. Due for diabetic eye exam and foot exam.   HTN:  BP today 138/90 today. Recheck BP 135/81. Currently taking Lisinopril 462mQD and Amlodipine 14m1mD. Endorses compliance. Denies any chest pain, SOB, LE swelling, vision changes, or headache.  HLD: Last lipid panel in 2019: Chol 85, Trig 82, HDL 26, LDL 43. Currently not taking a statin as she states she was discontinued on this medicine. History of taking Lipitor 14m57mthout any issue. She is not sure why it was discontinued but she is open to restarting.   Health Maintenance: Due for Hepatitis C screen, Pap-smear, colonoscopy, and diabetic foot exam, and diabetic eye exam. Denies any family history of colon cancer. Denies any abnormal colonoscopy.  Review of Systems:  Per HPI.   PMFSSturgisdications and smoking status reviewed.  Objective:   BP 135/80   Pulse 88   Temp 97.9 F (36.6 C) (Oral)   Wt 243 lb (110.2 kg)   SpO2 98%   BMI 44.45 kg/m  Vitals and nursing note reviewed.  General: pleasant older obese lady, appears tired/uncomfortable, sitting in exam chair with cane at side CV: regular rate and rhythm without murmurs, rubs, or gallops, no lower extremity edema Lungs: clear to auscultation bilaterally with normal work of breathing Skin: warm, dry Extremities: warm and well perfused, Right medial mid foot  with firm tender nodule below skin that is moveable with palpation, no surrounding erythema or warmth, overlying skin is well appearing   Diabetic foot exam was performed with the following findings:   No deformities, ulcerations, or other skin breakdown Normal sensation of 10g monofilament Intact posterior tibialis and dorsalis pedis pulses     Assessment & Plan:   Essential hypertension Mildly above goal today. Given addition of SGLT-2 and its mild BP lowering effect, will continue current medications at this time. If remains above goal in future, can consider increasing Amlodipine to 10mg3m - Continue Lisinopril 40mg 53mnd Amlodipine 14mg QD71mncontrolled diabetes mellitus with complication, with long-term current use of insulin (HCC) [place]rolled and worsening since last visit in April with A1C of 13.7. Patient is currently on Novolog, Lantus, and GLP-1. Unable to adjust insulin given patient has not been checking her blood sugar. Refilled all diabetes supplies to pharmacy. >50% of the visit spent on diabetes education and importance of keeping a Blood Sugar log. Given comorbidities, patient would greatly benefit from SGLT-2 inhibitor. Will start Invocana 300mg QD20mstory of intolerance to Metformin. Will plan to follow up 1 in month via telemedicine to review blood sugar log and make further adjustments. Will have patient come in for repeat CMP to monitor kidney function in 1 month. CMP ordered and notable for elevated BS of 306, stable kidney and liver function. Also referred patient to Chronic Care Management for further assistance. Diabetic foot exam performed today and WNL. Referral placed  for diabetic eye exam. Consider referral to [name] for further diabetes management if continues to be difficult to control. Repeat A1C in 3 months.   Diabetic neuropathy ([place]) Refill for Gabapentin provided.  Hyperlipidemia associated with type 2 diabetes mellitus Patient discontinued from  Lipitor for unknown reason. Lipid panel obtained and notable for Chol 189, HDL 46, Trig 139, and LDL 118.  10-year ASCVD risk score is 22% (uncontrolled diabetes, age, female, AA). Patient has tolerated Lipitor in the past. Will restart today.    Health Maintenance: Declined colon cancer screen and Pap-smear Hep C Ab ordered today Diabetic foot exam performed today and WNL Referral to ophthalmologist for Diabetic eye exam placed today   Orders Placed This Encounter  Procedures  . Comprehensive metabolic panel  . Hepatitis c antibody (reflex)  . Lipid Panel  . HCV Comment:  . Basic Metabolic Panel    Standing Status:   Future    Standing Expiration Date:   07/11/2020  . Ambulatory referral to Ophthalmology    Referral Priority:   Routine    Referral Type:   Consultation    Referral Reason:   Specialty Services Required    Requested Specialty:   Ophthalmology    Number of Visits Requested:   1  . Ambulatory referral to Chronic Care Management Services    Referral Priority:   Routine    Referral Type:   Consultation    Referral Reason:   Care Coordination    Number of Visits Requested:   1  . HgB A1c   Meds ordered this encounter  Medications  . canagliflozin (INVOKANA) 300 MG TABS tablet    Sig: Take 1 tablet (300 mg total) by mouth daily before breakfast.    Dispense:  30 tablet    Refill:  1  . atorvastatin (LIPITOR) 80 MG tablet    Sig: Take 1 tablet (80 mg total) by mouth daily.    Dispense:  90 tablet    Refill:  3  . Blood Glucose Monitoring Suppl (ONETOUCH VERIO) w/Device KIT    Sig: Check blood sugar 3 times per day.    Dispense:  1 kit    Refill:  0  . glucose blood (ONETOUCH VERIO) test strip    Sig: Check blood sugar 3 times daily    Dispense:  100 each    Refill:  12  . OneTouch Delica Lancets 33X MISC    Sig: Check blood sugar 3 times daily    Dispense:  100 each    Refill:  11  . nystatin (MYCOSTATIN/NYSTOP) powder    Sig: Apply topically 4 (four)  times daily as needed.    Dispense:  60 g    Refill:  5  . gabapentin (NEURONTIN) 300 MG capsule    Sig: Take 1 capsule (300 mg total) by mouth 3 (three) times daily.    Dispense:  90 capsule    Refill:  [place], DO PGY-2, [facility] 07/13/2019 6:49 PM

## 2019-07-12 LAB — COMPREHENSIVE METABOLIC PANEL
ALT: 10 IU/L (ref 0–32)
AST: 14 IU/L (ref 0–40)
Albumin/Globulin Ratio: 1.3 (ref 1.2–2.2)
Albumin: 4 g/dL (ref 3.8–4.8)
Alkaline Phosphatase: 119 IU/L — ABNORMAL HIGH (ref 39–117)
BUN/Creatinine Ratio: 12 (ref 12–28)
BUN: 12 mg/dL (ref 8–27)
Bilirubin Total: 0.3 mg/dL (ref 0.0–1.2)
CO2: 21 mmol/L (ref 20–29)
Calcium: 9.4 mg/dL (ref 8.7–10.3)
Chloride: 101 mmol/L (ref 96–106)
Creatinine, Ser: 1.03 mg/dL — ABNORMAL HIGH (ref 0.57–1.00)
GFR calc Af Amer: 66 mL/min/{1.73_m2} (ref >59)
GFR calc non Af Amer: 58 mL/min/{1.73_m2} — ABNORMAL LOW (ref >59)
Globulin, Total: 3.1 g/dL (ref 1.5–4.5)
Glucose: 306 mg/dL — ABNORMAL HIGH (ref 65–99)
Potassium: 4.7 mmol/L (ref 3.5–5.2)
Sodium: 139 mmol/L (ref 134–144)
Total Protein: 7.1 g/dL (ref 6.0–8.5)

## 2019-07-12 LAB — LIPID PANEL
Chol/HDL Ratio: 4.1 ratio (ref 0.0–4.4)
Cholesterol, Total: 189 mg/dL (ref 100–199)
HDL: 46 mg/dL (ref >39)
LDL Chol Calc (NIH): 118 mg/dL — ABNORMAL HIGH (ref 0–99)
Triglycerides: 139 mg/dL (ref 0–149)
VLDL Cholesterol Cal: 25 mg/dL (ref 5–40)

## 2019-07-12 LAB — HEPATITIS C ANTIBODY (REFLEX): HCV Ab: <0.1 s/co ratio (ref 0.0–0.9)

## 2019-07-12 LAB — HCV COMMENT:

## 2019-07-12 NOTE — Assessment & Plan Note (Signed)
Mildly above goal today. Given addition of SGLT-2 and its mild BP lowering effect, will continue current medications at this time. If remains above goal in future, can consider increasing Amlodipine to 10mg  QD. - Continue Lisinopril 40mg  QD and Amlodipine 5mg  QD

## 2019-07-12 NOTE — Assessment & Plan Note (Signed)
>>  ASSESSMENT AND PLAN FOR UNCONTROLLED DIABETES MELLITUS WITH COMPLICATION, WITH LONG-TERM CURRENT USE OF INSULIN WRITTEN ON 07/13/2019  6:48 PM BY MULLIS, KIERSTEN P, DO  Uncontrolled and worsening since last visit in April with A1C of 13.7. Patient is currently on Novolog, Lantus, and GLP-1. Unable to adjust insulin given patient has not been checking her blood sugar. Refilled all diabetes supplies to pharmacy. >50% of the visit spent on diabetes education and importance of keeping a Blood Sugar log. Given comorbidities, patient would greatly benefit from SGLT-2 inhibitor. Will start Invocana 300mg  QD. History of intolerance to Metformin. Will plan to follow up 1 in month via telemedicine to review blood sugar log and make further adjustments. Will have patient come in for repeat CMP to monitor kidney function in 1 month. CMP ordered and notable for elevated BS of 306, stable kidney and liver function. Also referred patient to Chronic Care Management for further assistance. Diabetic foot exam performed today and WNL. Referral placed for diabetic eye exam. Consider referral to [name] for further diabetes management if continues to be difficult to control. Repeat A1C in 3 months.

## 2019-07-12 NOTE — Assessment & Plan Note (Signed)
Refill for Gabapentin provided.

## 2019-07-12 NOTE — Assessment & Plan Note (Addendum)
Uncontrolled and worsening since last visit in April with A1C of 13.7. Patient is currently on Novolog, Lantus, and GLP-1. Unable to adjust insulin given patient has not been checking her blood sugar. Refilled all diabetes supplies to pharmacy. >50% of the visit spent on diabetes education and importance of keeping a Blood Sugar log. Given comorbidities, patient would greatly benefit from SGLT-2 inhibitor. Will start Invocana 300mg  QD. History of intolerance to Metformin. Will plan to follow up 1 in month via telemedicine to review blood sugar log and make further adjustments. Will have patient come in for repeat CMP to monitor kidney function in 1 month. CMP ordered and notable for elevated BS of 306, stable kidney and liver function. Also referred patient to Chronic Care Management for further assistance. Diabetic foot exam performed today and WNL. Referral placed for diabetic eye exam. Consider referral to [name] for further diabetes management if continues to be difficult to control. Repeat A1C in 3 months.

## 2019-07-12 NOTE — Assessment & Plan Note (Signed)
Patient discontinued from Lipitor for unknown reason. Lipid panel obtained and notable for Chol 189, HDL 46, Trig 139, and LDL 118.  10-year ASCVD risk score is 22% (uncontrolled diabetes, age, female, AA). Patient has tolerated Lipitor in the past. Will restart today.

## 2019-07-15 ENCOUNTER — Other Ambulatory Visit: Payer: Self-pay | Admitting: Family Medicine

## 2019-07-15 DIAGNOSIS — I10 Essential (primary) hypertension: Secondary | ICD-10-CM

## 2019-07-15 DIAGNOSIS — E785 Hyperlipidemia, unspecified: Secondary | ICD-10-CM

## 2019-07-15 DIAGNOSIS — I4891 Unspecified atrial fibrillation: Secondary | ICD-10-CM

## 2019-07-15 DIAGNOSIS — E1169 Type 2 diabetes mellitus with other specified complication: Secondary | ICD-10-CM

## 2019-07-15 DIAGNOSIS — IMO0002 Reserved for concepts with insufficient information to code with codable children: Secondary | ICD-10-CM

## 2019-07-15 DIAGNOSIS — E1165 Type 2 diabetes mellitus with hyperglycemia: Secondary | ICD-10-CM

## 2019-07-18 ENCOUNTER — Telehealth: Payer: Self-pay | Admitting: Family Medicine

## 2019-07-18 NOTE — Chronic Care Management (AMB) (Signed)
  Chronic Care Management   Outreach Note  07/18/2019 Name: [name] MRN: [id number] DOB: 1956/07/22  [name] is a 63 y.o. year old female who is a primary care patient of Danna Hefty, DO. I reached out to Thornell Sartorius by phone today in response to a referral sent by Ms. [place] PCP, Mina Marble DO     An unsuccessful telephone outreach was attempted today. The patient was referred to the case management team by for assistance with care management and care coordination.   Follow Up Plan: The care management team will reach out to the patient again over the next 7 days.   Glenna Durand, LPN Health Advisor, Embedded Care Coordination [facility] ??[email or web address] ??[phone]

## 2019-07-22 NOTE — Chronic Care Management (AMB) (Signed)
  Chronic Care Management   Note  07/22/2019 Name: [name] MRN: [id number] DOB: Jan 29, 1956  [name] is a 63 y.o. year old female who is a primary care patient of Danna Hefty, DO. I reached out to Thornell Sartorius by phone today in response to a referral sent by Ms. [place] PCP, Rickard Rhymes DO     Ms. Eckley was given information about Chronic Care Management services today including:  1. CCM service includes personalized support from designated clinical staff supervised by her physician, including individualized plan of care and coordination with other care providers 2. 24/7 contact phone numbers for assistance for urgent and routine care needs. 3. Service will only be billed when office clinical staff spend 20 minutes or more in a month to coordinate care. 4. Only one practitioner may furnish and bill the service in a calendar month. 5. The patient may stop CCM services at any time (effective at the end of the month) by phone call to the office staff. 6. The patient will be responsible for cost sharing (co-pay) of up to 20% of the service fee (after annual deductible is met).  Patient agreed to services and verbal consent obtained.   Follow up plan: Telephone appointment with CCM team member scheduled for:07/26/2019  Glenna Durand, LPN Health Advisor, [facility] ??[email or web address] ??[phone]

## 2019-07-26 ENCOUNTER — Other Ambulatory Visit: Payer: Self-pay

## 2019-07-26 ENCOUNTER — Telehealth: Payer: Medicare HMO

## 2019-08-12 ENCOUNTER — Other Ambulatory Visit: Payer: Self-pay

## 2019-08-12 ENCOUNTER — Telehealth (INDEPENDENT_AMBULATORY_CARE_PROVIDER_SITE_OTHER): Payer: Medicare Other | Admitting: Family Medicine

## 2019-08-12 DIAGNOSIS — IMO0002 Reserved for concepts with insufficient information to code with codable children: Secondary | ICD-10-CM

## 2019-08-12 DIAGNOSIS — E1165 Type 2 diabetes mellitus with hyperglycemia: Secondary | ICD-10-CM

## 2019-08-12 DIAGNOSIS — Z794 Long term (current) use of insulin: Secondary | ICD-10-CM | POA: Diagnosis not present

## 2019-08-12 DIAGNOSIS — G4733 Obstructive sleep apnea (adult) (pediatric): Secondary | ICD-10-CM

## 2019-08-12 NOTE — Progress Notes (Cosign Needed)
[facility] Telemedicine Visit  Patient consented to have virtual visit. Method of visit: Video was attempted, but technology challenges prevented patient from using video, so visit was conducted via telephone.  Encounter participants: Patient: [name] - located at home Provider: Danna Hefty - located at [facility] Others (if applicable): None  Chief Complaint: Diabetes follow up   HPI: Patient is here for telemedicine visit for diabetes follow up. Her A1C was significantly elevated to 13.7 at last visit 1 month ago. She was not keeping logs of her blood sugars given she was out of supplies. All supplies were refilled. Patient was started on Invokana 300mg  QD and continued on her Novolog 30U in the morning and 10U in the evening, Lantus 63U before bed, Victoza 1.8mg  QD. She endorses compliance and no adverse effects. CBG's: fasting 329-360. Preprandial: 360's. Postprandial: 360's Notes fasting blood sugar this morning (at time of visit) was 298. She notes she eats lots sweets which is likely contributing. She has trouble sleeping which leads to her snacking all throughout the night. Has been to a nutritionist before which helped initially. Drinks sugar free sodas. Uses splenda in coffee. No other teas or juices.   ROS: per HPI   Pertinent PMHx: Uncontrolled type 2 diabetes, OSA  Exam:  Respiratory: Speaking in full sentences  Assessment/Plan:  Uncontrolled diabetes mellitus with complication, with long-term current use of insulin (HCC) Continues to have uncontrolled blood sugars despite SGLT-2, GLP1, and high doses of insulin. Had long discussion about diet and high intake of sweets as control will likely be unsuccessful until she makes these diet modifications.. Referral for Nutrition and Diabetic counseling placed. Also scheduled patient with [name] for further analysis of diabetes regimen. Believe patient may benefit from an alternative insulin dosing  with better meal time coverage as well as possibly BID long acting dosing. However, as stated above, expect minimal improvement until patient imrpoves on her diet intake of sweets. Although Lantus dosing is less effective above 50U, will start by increasing her Lantus to help with her fasting blood sugars until patient can be seen by [name]. Patient amendable to working on diet changes.  - Increase Lantus to 70U qHS - Continue Novolog as prescribed - Continue Invocana 300mg  QD - continue Victoza 1.8mg  QD - Order for Nutrition and Diabetic services placed - Appointment with [name] scheduled for 1/14 - Follow appointment with PCP on 2/17  Mild obstructive sleep apnea Long history of OSA. Patient noncompliant with CPAP as she notes there is a hole in her CPAP hose. This is likely contributing to her insomnia and thus overeating. She was instructed to see a medical supply store in 2016 to replace her CPAP hose but she never did. At this point, her CPAP machine may be outdated and thus may require a new sleep study to obtain new CPAP and appropriate setting. She will attempt to find replacement hose. If unsuccessful, then will send patient for repeat sleep study for replacement CPAP. Instructed patient to try Melatonin for insomnia in the mean time.   Time spent during visit with patient: 35 minutes  Mina Marble, [place], PGY2 08/11/18

## 2019-08-14 NOTE — Assessment & Plan Note (Signed)
Continues to have uncontrolled blood sugars despite SGLT-2, GLP1, and high doses of insulin. Had long discussion about diet and high intake of sweets as control will likely be unsuccessful until she makes these diet modifications.. Referral for Nutrition and Diabetic counseling placed. Also scheduled patient with [name] for further analysis of diabetes regimen. Believe patient may benefit from an alternative insulin dosing with better meal time coverage as well as possibly BID long acting dosing. However, as stated above, expect minimal improvement until patient imrpoves on her diet intake of sweets. Although Lantus dosing is less effective above 50U, will start by increasing her Lantus to help with her fasting blood sugars until patient can be seen by [name]. Patient amendable to working on diet changes.  - Increase Lantus to 70U qHS - Continue Novolog as prescribed - Continue Invocana 300mg  QD - continue Victoza 1.8mg  QD - Order for Nutrition and Diabetic services placed - Appointment with [name] scheduled for 1/14 - Follow appointment with PCP on 2/17

## 2019-08-14 NOTE — Assessment & Plan Note (Signed)
>>  ASSESSMENT AND PLAN FOR UNCONTROLLED DIABETES MELLITUS WITH COMPLICATION, WITH LONG-TERM CURRENT USE OF INSULIN WRITTEN ON 08/14/2019  4:48 PM BY MULLIS, KIERSTEN P, DO  Continues to have uncontrolled blood sugars despite SGLT-2, GLP1, and high doses of insulin. Had long discussion about diet and high intake of sweets as control will likely be unsuccessful until she makes these diet modifications.. Referral for Nutrition and Diabetic counseling placed. Also scheduled patient with [name] for further analysis of diabetes regimen. Believe patient may benefit from an alternative insulin dosing with better meal time coverage as well as possibly BID long acting dosing. However, as stated above, expect minimal improvement until patient imrpoves on her diet intake of sweets. Although Lantus dosing is less effective above 50U, will start by increasing her Lantus to help with her fasting blood sugars until patient can be seen by [name]. Patient amendable to working on diet changes.  - Increase Lantus to 70U qHS - Continue Novolog as prescribed - Continue Invocana 300mg  QD - continue Victoza 1.8mg  QD - Order for Nutrition and Diabetic services placed - Appointment with [name] scheduled for 1/14 - Follow appointment with PCP on 2/17

## 2019-08-14 NOTE — Assessment & Plan Note (Addendum)
Long history of OSA. Patient noncompliant with CPAP as she notes there is a hole in her CPAP hose. This is likely contributing to her insomnia and thus overeating. She was instructed to see a medical supply store in 2016 to replace her CPAP hose but she never did. At this point, her CPAP machine may be outdated and thus may require a new sleep study to obtain new CPAP and appropriate setting. She will attempt to find replacement hose. If unsuccessful, then will send patient for repeat sleep study for replacement CPAP. Instructed patient to try Melatonin for insomnia in the mean time.

## 2019-08-18 ENCOUNTER — Other Ambulatory Visit: Payer: Self-pay

## 2019-08-18 ENCOUNTER — Ambulatory Visit (INDEPENDENT_AMBULATORY_CARE_PROVIDER_SITE_OTHER): Payer: Medicare Other | Admitting: Pharmacist

## 2019-08-18 VITALS — BP 140/90 | HR 82

## 2019-08-18 DIAGNOSIS — Z794 Long term (current) use of insulin: Secondary | ICD-10-CM | POA: Diagnosis not present

## 2019-08-18 DIAGNOSIS — IMO0002 Reserved for concepts with insufficient information to code with codable children: Secondary | ICD-10-CM

## 2019-08-18 DIAGNOSIS — E1165 Type 2 diabetes mellitus with hyperglycemia: Secondary | ICD-10-CM | POA: Diagnosis not present

## 2019-08-18 DIAGNOSIS — E118 Type 2 diabetes mellitus with unspecified complications: Secondary | ICD-10-CM

## 2019-08-18 MED ORDER — NOVOLOG FLEXPEN 100 UNIT/ML ~~LOC~~ SOPN: 25.0000 [IU] | PEN_INJECTOR | Freq: Two times a day (BID) | SUBCUTANEOUS | 11 refills | Status: DC

## 2019-08-18 NOTE — Patient Instructions (Signed)
It was a pleasure seeing you in clinic today!  1. INCREASE Lantus from 63 units daily to 68 units daily 2. CHANGE Novolog 25 units before lunch and dinner 3. Continue Vicotza 1.8 mg daily, Invokana 300 mg daily 4. Increase eating non-starchy vegetables from 3 days/week to 5 days/week 5. Decrease iced coffee from 2 days/week to 1 day/week  6. Start taking your medications as directed below  Morning 1) Amlodipine 2) Atorvastatin  3) Invokana 4) Gabapentin 5) Nystatin  Lunch 1) Novolog 2) Gabapentin 3) Nystatin  Dinner 1) Rivaroxaban  2) Lantus  3) Victoza  4) Lisinopril 5) Novolog 6) Gabapentin 7) Nystatin

## 2019-08-18 NOTE — Progress Notes (Signed)
S:     Chief Complaint  Patient presents with  . Medication Management    Diabetes     Patient arrives ambulating with assistance.  Presents for diabetes evaluation, education, and management. Patient was referred and last seen by Primary Care Provider, Dr. Tarry Kos, on 07/11/2019.  Patient reports Diabetes was diagnosed in 2010  Family/Social History: father (DM), grandparents on mother and father's side (DM); brother (DM)  Insurance coverage/medication affordability: [employer] (should be receiving in the mail soon)  Patient denies adherence with medications (forgets metformin/Invokana; does not forget insulin)  Current diabetes medications include: Invokana 300 mg daily, Victoza 1.8 mg daily, Novolog 10 units with breakfast and 30 units with lunch, Lantus 63 units daily Current hypertension medications include: lisinopril 40 mg daily, amlodipine 5 mg daily Current hyperlipidemia medications include: atorvastatin 80 mg daily  Patient denies hypoglycemic events.  Patient reported dietary habits: Eats 3-4 meals/day; "sometimes all day" Breakfast: yoplait yogurt, bacon sandwich  Lunch: "whatever she had to eat for dinner" Dinner: fried chicken, french fries, broccoli, brussel sprouts, rice Snacks: pretzels, yoplait yogurt,  Drinks: sprite zero, iced coffee (gets at [employer]) -Eats fast food 2x/week (fish sandwich with fries and iced coffee)  Patient-reported exercise habits: denies    Patient reports nocturia (nighttime urination) 3-5x per night  Patient reports neuropathy (nerve pain). Patient reports visual changes. -does not follow with optometrist/opthalmologist (last seen one ~2 years ago, had a bad experience with a provider) Patient performs occasional self foot exams  -has a growth on foot - reports she showed Dr. Tarry Kos -Dr. Gwendlyn Deutscher evaluated - will schedule pt for dermatology clinic -does not follow with podiatry    O:  Physical  Exam Musculoskeletal:       Feet:      Review of Systems  Genitourinary: Positive for frequency.     Lab Results  Component Value Date   HGBA1C 13.7 (A) 07/11/2019   There were no vitals filed for this visit.  Lipid Panel     Component Value Date/Time   CHOL 189 07/11/2019 1710   TRIG 139 07/11/2019 1710   HDL 46 07/11/2019 1710   CHOLHDL 4.1 07/11/2019 1710   CHOLHDL 3.3 09/16/2017 0302   VLDL 16 09/16/2017 0302   LDLCALC 118 (H) 07/11/2019 1710   LDLDIRECT 51 03/27/2016 1614   Checks her BG 3x per day (morning, before lunch, and 2 hour after lunch, "high all the time") Readings 220-398 (mostly ~300s)   Clinical Atherosclerotic Cardiovascular Disease (ASCVD): No  The 10-year ASCVD risk score Mikey Bussing DC Jr., et al., 2013) is: 22%   Values used to calculate the score:     Age: 37 years     Sex: Female     Is Non-Hispanic African American: Yes     Diabetic: Yes     Tobacco smoker: No     Systolic Blood Pressure: [id number] mmHg     Is BP treated: Yes     HDL Cholesterol: 46 mg/dL     Total Cholesterol: 189 mg/dL    A/P: Diabetes longstanding since 2010 currently uncontrolled. Patient is adherent with insulin, however, is not adherent with Invokana and Victoza. Control is suboptimal due to diet, lack of exercise, and nonadherence. -INCREASE basal insulin Lantus (insulin glargine) from 63 units daily to 65 units daily. -INCREASE rapid insulin Novolog (insulin aspart) from 10 units with breakfast and 33 units with lunch to 25 units with lunch and dinner. -Continue GLP-1 Victoza (generic  name liraglutide) 1.8 mg daily -Continue SGLT2-I Invokana (generic name canagliflozin) 300 mg daily  -Counseled thoroughly on diet. Plan for patient to increase eating non-starchy vegetables from 3x/week to 5x/week and decrease ice coffee from 2x/week to 1x/week  -Check fasting BG and 2-hr PP BG. -Extensively discussed pathophysiology of diabetes, recommended lifestyle interventions, dietary  effects on blood sugar control -Counseled on s/sx of and management of hypoglycemia -Next A1C anticipated 10/2019  ASCVD risk - primary in patient with diabetes. Last LDL is not controlled. ASCVD risk score is >20%  - High intensity statin indicated. Aspirin would likely not be appropriate considering she will be >10 years old soon. -Continued atorvastatin 80 mg -Consider initiation of Zetia 20 mg daily at future appt   Hypertension longstanding currently slightly above goal.  Blood pressure goal = <130/80 mmHg. Patient medication adherence decent.  -Continue lisinopril 40 mg daily -Continue amlodipine 5 mg daily  Written patient instructions provided.  Total time in face to face counseling 60 minutes.   Follow up Pharmacist in 2 weeks.   Patient seen with Drexel Iha, PharmD, PGY2 Ambulatory Care pharmacy resident and [name]

## 2019-08-19 ENCOUNTER — Encounter: Payer: Self-pay | Admitting: Pharmacist

## 2019-08-19 NOTE — Assessment & Plan Note (Signed)
>>  ASSESSMENT AND PLAN FOR UNCONTROLLED DIABETES MELLITUS WITH COMPLICATION, WITH LONG-TERM CURRENT USE OF INSULIN WRITTEN ON 08/19/2019  5:05 PM BY KOVAL, PETER G, RPH-CPP  Diabetes longstanding since 2010 currently uncontrolled. Patient is adherent with insulin, however, is not adherent with Invokana and Victoza. Control is suboptimal due to diet, lack of exercise, and nonadherence. -INCREASE basal insulin Lantus (insulin glargine) from 63 units daily to 65 units daily. -INCREASE rapid insulin Novolog (insulin aspart) from 10 units with breakfast and 33 units with lunch to 25 units with lunch and dinner. -Continue GLP-1 Victoza (generic name liraglutide) 1.8 mg daily -Continue SGLT2-I Invokana (generic name canagliflozin) 300 mg daily  -Counseled thoroughly on diet. Plan for patient to increase eating non-starchy vegetables from 3x/week to 5x/week and decrease ice coffee from 2x/week to 1x/week  -Check fasting BG and 2-hr PP BG. -Extensively discussed pathophysiology of diabetes, recommended lifestyle interventions, dietary effects on blood sugar control -Counseled on s/sx of and management of hypoglycemia -Next A1C anticipated 10/2019

## 2019-08-19 NOTE — Assessment & Plan Note (Signed)
Diabetes longstanding since 2010 currently uncontrolled. Patient is adherent with insulin, however, is not adherent with Invokana and Victoza. Control is suboptimal due to diet, lack of exercise, and nonadherence. -INCREASE basal insulin Lantus (insulin glargine) from 63 units daily to 65 units daily. -INCREASE rapid insulin Novolog (insulin aspart) from 10 units with breakfast and 33 units with lunch to 25 units with lunch and dinner. -Continue GLP-1 Victoza (generic name liraglutide) 1.8 mg daily -Continue SGLT2-I Invokana (generic name canagliflozin) 300 mg daily  -Counseled thoroughly on diet. Plan for patient to increase eating non-starchy vegetables from 3x/week to 5x/week and decrease ice coffee from 2x/week to 1x/week  -Check fasting BG and 2-hr PP BG. -Extensively discussed pathophysiology of diabetes, recommended lifestyle interventions, dietary effects on blood sugar control -Counseled on s/sx of and management of hypoglycemia -Next A1C anticipated 10/2019

## 2019-08-20 NOTE — Progress Notes (Signed)
Reviewed: I agree with Dr. Koval's documentation and management. 

## 2019-08-25 ENCOUNTER — Telehealth: Payer: Self-pay | Admitting: Pharmacist

## 2019-08-25 DIAGNOSIS — E1165 Type 2 diabetes mellitus with hyperglycemia: Secondary | ICD-10-CM

## 2019-08-25 DIAGNOSIS — IMO0002 Reserved for concepts with insufficient information to code with codable children: Secondary | ICD-10-CM

## 2019-08-25 MED ORDER — NOVOLOG FLEXPEN 100 UNIT/ML ~~LOC~~ SOPN: 30.0000 [IU] | PEN_INJECTOR | Freq: Two times a day (BID) | SUBCUTANEOUS | Status: DC

## 2019-08-25 MED ORDER — LANTUS SOLOSTAR 100 UNIT/ML ~~LOC~~ SOPN: 65.0000 [IU] | PEN_INJECTOR | Freq: Every day | SUBCUTANEOUS | Status: DC

## 2019-08-25 NOTE — Telephone Encounter (Signed)
Contacted patient RE diabetes control.   She reports number are improved but still "high".  States she is no longer seeing > 300 but also states lowest AM fasting are still in the high 100s (reported 182) and low 200s (204 fasting this AM).    She reported taking Lanuts 65 units daily in the PM and Novolog 25 units twice daily prior to large meals.   We agreed a dose increase would be appropriate.   She agreed to increase her Novolog to 30 units twice daily prior to her large meals. (no change in Lantus).   Plan phone follow-up in 1 week.

## 2019-08-25 NOTE — Telephone Encounter (Signed)
Noted and agree. 

## 2019-09-01 ENCOUNTER — Telehealth: Payer: Self-pay | Admitting: Pharmacist

## 2019-09-01 NOTE — Telephone Encounter (Signed)
Called patient on 09/01/2019 at 9:51 AM  FBG: 221, 331, 194, 177, 191, 250 2-hr PP BG: 157, 208  Patient reports she is taking Lantus 60- 65 units (isn't sure exact dose), Novolog 25 units BID, Victoza 1.8 mg daily, and Invokana 300 mg daily. When asked if she remembers about any medication changes made by [name] last week - she says "I can't remember I think I was half asleep when I talked to him". She reports issues with insomnia, but denies nocturia.   She denies any hypoglycemic episodes. She does state that she feels that "worms are crawling on her skin if her BG gets below <200 mg/dL" and she will eat chocolate. Encouraged patient to substitute low carb snack for chocolate. Patient states she like yoplait yogurt and is agreeable to eat yoplait light yogurt rather than chocolate when she experiences "worms crawling on her feeling" when BG <200 mg/dL.   Plan for patient to increase Novolog to 30 units BID (as [name] previously instructed) and to take Lantus 65 units daily consistently. Plan to continue Victoza 1.8 mg daily and Invokana 300 mg daily. Instructed patient to write down doses so she does not forget -- waited on the phone while she wrote down DM medication doses. Patient was also able to repeat back medication changes successfully. Patient verbalized understanding to the plan.  Will follow up with patient again in 1 week.  Thank you for involving pharmacy to assist in providing this patient's care.   Drexel Iha, PharmD PGY2 Ambulatory Care Pharmacy Resident

## 2019-09-08 ENCOUNTER — Telehealth: Payer: Self-pay | Admitting: Pharmacist

## 2019-09-08 DIAGNOSIS — IMO0002 Reserved for concepts with insufficient information to code with codable children: Secondary | ICD-10-CM

## 2019-09-08 DIAGNOSIS — E1165 Type 2 diabetes mellitus with hyperglycemia: Secondary | ICD-10-CM

## 2019-09-08 MED ORDER — LANTUS SOLOSTAR 100 UNIT/ML ~~LOC~~ SOPN: 65.0000 [IU] | PEN_INJECTOR | Freq: Every day | SUBCUTANEOUS | Status: DC

## 2019-09-08 MED ORDER — VICTOZA 18 MG/3ML ~~LOC~~ SOPN: 1.8000 mg | PEN_INJECTOR | Freq: Every day | SUBCUTANEOUS | Status: DC

## 2019-09-08 MED ORDER — NOVOLOG FLEXPEN 100 UNIT/ML ~~LOC~~ SOPN: 35.0000 [IU] | PEN_INJECTOR | Freq: Two times a day (BID) | SUBCUTANEOUS | Status: DC

## 2019-09-08 NOTE — Telephone Encounter (Signed)
Noted and agree. 

## 2019-09-08 NOTE — Assessment & Plan Note (Signed)
Contacted patient to follow-up on blood glucose control.  She reports more consistent readings in the 200-300 range.  Denies readings> 300 and noted one reading in the high 100s.   She states she feels "strange" at times after taking her Novolog in the AM and manages this by eating a piece of chocolate.   She reports taking Humalog 35 units prior to meals twice daily.   Lantus is reported as intermittently (possibly only Q 2-3 days).  Victoza use is reported as daily.   We discussed simplified timing of regimen after she reported she is tired of all the different dosing and needle sticks.  Following a brief discussion, we agreed to administer both Lantus and Victoza in the AM at the same time as the Novolog, immediately prior to breakfast.  She agreed to take her second dose of Novolog prior to dinner.  She is only taking Novolog twice daily.   Continued same doses - adjusted dosing times.   Plan phone call reevaluation and possibly adjustment in 10-14 days.

## 2019-09-08 NOTE — Telephone Encounter (Signed)
Contacted patient to follow-up on blood glucose control.  She reports more consistent readings in the 200-300 range.  Denies readings> 300 and noted one reading in the high 100s.   She states she feels "strange" at times after taking her Novolog in the AM and manages this by eating a piece of chocolate.   She reports taking Humalog 35 units prior to meals twice daily.   Lantus is reported as intermittently (possibly only Q 2-3 days).  Victoza use is reported as daily.   We discussed simplified timing of regimen after she reported she is tired of all the different dosing and needle sticks.  Following a brief discussion, we agreed to administer both Lantus and Victoza in the AM at the same time as the Novolog, immediately prior to breakfast.  She agreed to take her second dose of Novolog prior to dinner.  She is only taking Novolog twice daily.   Continued same doses - adjusted dosing times.   Plan phone call reevaluation and possibly adjustment in 10-14 days.

## 2019-09-08 NOTE — Assessment & Plan Note (Signed)
>>  ASSESSMENT AND PLAN FOR UNCONTROLLED DIABETES MELLITUS WITH COMPLICATION, WITH LONG-TERM CURRENT USE OF INSULIN WRITTEN ON 09/08/2019  2:12 PM BY KOVAL, PETER G, RPH-CPP  Contacted patient to follow-up on blood glucose control.  She reports more consistent readings in the 200-300 range.  Denies readings> 300 and noted one reading in the high 100s.   She states she feels "strange" at times after taking her Novolog in the AM and manages this by eating a piece of chocolate.   She reports taking Humalog 35 units prior to meals twice daily.   Lantus is reported as intermittently (possibly only Q 2-3 days).  Victoza use is reported as daily.   We discussed simplified timing of regimen after she reported she is tired of all the different dosing and needle sticks.  Following a brief discussion, we agreed to administer both Lantus and Victoza in the AM at the same time as the Novolog, immediately prior to breakfast.  She agreed to take her second dose of Novolog prior to dinner.  She is only taking Novolog twice daily.   Continued same doses - adjusted dosing times.   Plan phone call reevaluation and possibly adjustment in 10-14 days.

## 2019-09-20 ENCOUNTER — Encounter: Payer: Medicare Other | Attending: Family Medicine | Admitting: Skilled Nursing Facility1

## 2019-09-20 ENCOUNTER — Other Ambulatory Visit: Payer: Self-pay

## 2019-09-20 ENCOUNTER — Encounter: Payer: Self-pay | Admitting: Skilled Nursing Facility1

## 2019-09-20 DIAGNOSIS — E118 Type 2 diabetes mellitus with unspecified complications: Secondary | ICD-10-CM | POA: Diagnosis present

## 2019-09-20 DIAGNOSIS — E1165 Type 2 diabetes mellitus with hyperglycemia: Secondary | ICD-10-CM | POA: Diagnosis present

## 2019-09-20 DIAGNOSIS — Z794 Long term (current) use of insulin: Secondary | ICD-10-CM | POA: Insufficient documentation

## 2019-09-20 DIAGNOSIS — IMO0002 Reserved for concepts with insufficient information to code with codable children: Secondary | ICD-10-CM

## 2019-09-20 NOTE — Progress Notes (Signed)
Pt states she has trouble reading due to blurry vision.  Pt states she had her blood sugars controlled one time in her 11 years of having diabetes stating she changed her diet at that time.  Pt states she stopped taking her insulin for a while (suspected diabetes burnout).  Pt states her kids offered to check her blood sugars for her but she but she would not allow this. Pt states she uses the pen now which helps her to stick with taking her insulin. Pt states she does not have her lantus right now because she needs a refill.  Pt states she has seen a dietitian about 11 years ago but does not remebr anything from that visit.  Pt states her daughter and her 4 children live with her, daughter makes her meals.  Pt states her and her daughter get in arguments often due to her daughter trying to cook healthier for her and help her check her blood sugars-states she does not like a lot of vegetables and will only eat her foods a certain way: cooked with grease and fatback. Pt states she cannot stand for long periods of time or walk and feels she is a burden. Pt states she has degenertive disc disease. Pt states she does not sleep at night so she eats in the night.  Pt states she has 2 teeth that need to be pulled which cause her pain so she chews with her back teeth not stopping her from eating foods.  Pt states she checks her blood sugars 2 times a day: 144, 116, 170, 233 Pt states she has burniung in her feet.  Pt believed flossing just to be a vanity thing and not actually important for health and had a number of misunderstandings on each health topic educated on pertaining to type 2 diabetes.   Education topics: Retinopathy Her Increased risk of heart disease/heart attack Complex carbohydrates verses simple carbohydrates  What is diabetes and how does it work physiologically  The importance of proper oral care and especially flossing  Diabetic neuropathy and the importance of a podiatrist  The  importance of checking your feet daily and what to look for  Research indicating acid's negative impact on the kidneys (with reference to her only drinking sprite zero)  Goals: Try water with lemon or fruit: aim for 2 glasses a day  Make an appointment with the foot doctor Make an appointment with the dentist Floss every day Check your feet every day: looking for anything that was not there the day before Work on not eating or putting anything in your mouth between 12am-7am  Try to limit yourself to 1 sandwich at a time with 1 small bag of chips Go through your arm chair exercises once every day  Bring your daughter with you to the next appointment

## 2019-09-21 ENCOUNTER — Encounter: Payer: Self-pay | Admitting: Family Medicine

## 2019-09-21 ENCOUNTER — Telehealth: Payer: Self-pay | Admitting: Pharmacist

## 2019-09-21 ENCOUNTER — Other Ambulatory Visit: Payer: Self-pay

## 2019-09-21 ENCOUNTER — Telehealth (INDEPENDENT_AMBULATORY_CARE_PROVIDER_SITE_OTHER): Payer: Medicare Other | Admitting: Family Medicine

## 2019-09-21 DIAGNOSIS — IMO0002 Reserved for concepts with insufficient information to code with codable children: Secondary | ICD-10-CM

## 2019-09-21 DIAGNOSIS — E1165 Type 2 diabetes mellitus with hyperglycemia: Secondary | ICD-10-CM | POA: Diagnosis not present

## 2019-09-21 DIAGNOSIS — E118 Type 2 diabetes mellitus with unspecified complications: Secondary | ICD-10-CM

## 2019-09-21 DIAGNOSIS — Z794 Long term (current) use of insulin: Secondary | ICD-10-CM

## 2019-09-21 MED ORDER — LANTUS SOLOSTAR 100 UNIT/ML ~~LOC~~ SOPN: 65.0000 [IU] | PEN_INJECTOR | Freq: Every day | SUBCUTANEOUS | 3 refills | Status: DC

## 2019-09-21 NOTE — Progress Notes (Signed)
[facility] Telemedicine Visit  Patient consented to have virtual visit. Method of visit: Video  Encounter participants: Patient: [name] - located at home Provider: Danna Hefty - located at [facility] Others (if applicable): None  Chief Complaint: Uncontrolled Type 2 Diabetes follow up  HPI: Uncontrolled Type 2 Diabetes: Currently on Lantus 65U QD, Novolog 35U with lunch and dinner, Victoza 1.8mg  QD, and Invokana 300mg  QD. CBG's range: 250-270's. Denies readings >300.  Notes she has been out of Lantus for 2 weeks. She tried to talk to the pharmacy but has not heard anything. She has been working with [name] and nutritionist. She is motivated to make changes that they have recommended such as not heating throughout the night. Denies any hypoglycemic episodes.   ROS: per HPI  Pertinent PMHx: Uncontrolled type 2 DM, HLD, HTN, CKD, a-fib, neuropathy  Exam:  Respiratory: Speaking in full sentences, breathing comfortably  Assessment/Plan:  Uncontrolled diabetes mellitus with complication, with long-term current use of insulin (HCC) Improving. Has been out of lantus x 2 weeks which would likely improve her CBG's. Lantus refilled and patient instructed to restart ASAP.  - Continue Lantus 65U QD, Novolog 35U with lunch and dinner, Victoza 1.8mg  QD, and Invokana 300mg  QD - Check fasting BG and 2-hr PP BG. - Counseled on diet. Patient to follow up with nutritionist as scheduled (3/15) - Follow up on 10/21/19 for repeat A1C   Time spent during visit with patient: 21 minutes  Mina Marble, [place], PGY2 09/21/19

## 2019-09-21 NOTE — Telephone Encounter (Signed)
Phone call follow-up to evaluate blood glucose control.  Patient reports fasting blood glucose readings low 100s - 176 (much improved).  She denies hypoglycemia.   No changes today.   Patient has follow-up visit 2/19 with physician.  We discussed that if she is improved we could determine need for me to follow-up at that time.   She expressed interest in CGM system.  I shared that I believe she could be a candidate and she should discuss more with her PCP on Friday.  If they agreed then I would be happy to see her again to help with CGM placement.

## 2019-09-23 NOTE — Assessment & Plan Note (Signed)
>>  ASSESSMENT AND PLAN FOR UNCONTROLLED DIABETES MELLITUS WITH COMPLICATION, WITH LONG-TERM CURRENT USE OF INSULIN WRITTEN ON 09/23/2019  2:58 PM BY MULLIS, KIERSTEN P, DO  Improving. Has been out of lantus x 2 weeks which would likely improve her CBG's. Lantus refilled and patient instructed to restart ASAP.  - Continue Lantus 65U QD, Novolog 35U with lunch and dinner, Victoza 1.8mg  QD, and Invokana 300mg  QD - Check fasting BG and 2-hr PP BG. - Counseled on diet. Patient to follow up with nutritionist as scheduled (3/15) - Follow up on 10/21/19 for repeat A1C

## 2019-09-23 NOTE — Assessment & Plan Note (Signed)
Improving. Has been out of lantus x 2 weeks which would likely improve her CBG's. Lantus refilled and patient instructed to restart ASAP.  - Continue Lantus 65U QD, Novolog 35U with lunch and dinner, Victoza 1.8mg  QD, and Invokana 300mg  QD - Check fasting BG and 2-hr PP BG. - Counseled on diet. Patient to follow up with nutritionist as scheduled (3/15) - Follow up on 10/21/19 for repeat A1C

## 2019-09-26 ENCOUNTER — Telehealth: Payer: Self-pay | Admitting: Pharmacist

## 2019-09-26 NOTE — Telephone Encounter (Signed)
Contacted patient to determine insurance information to pursue prior authorization via insurance to obtain Dexcom G6 CGM.  Social worker Medicare Advantage Plan 2 ([place]) [place] Member ID: [id number] RxBIN: [id number] RxPCN: [id number] RxGrp: [id number]  Successfully submitted prior authorization for Dexcom G6 CGM sensors, transmitter, and receiver.  Will contact patient to notify her of approval or denial.   Will scan in insurance card to EMR at next office visit.  Thank you for involving pharmacy to assist in providing this patient's care.   Drexel Iha, PharmD PGY2 Ambulatory Care Pharmacy Resident

## 2019-09-27 ENCOUNTER — Telehealth: Payer: Self-pay | Admitting: Pharmacist

## 2019-09-27 NOTE — Telephone Encounter (Signed)
Called patient on 09/27/2019 at 4:04 PM   Patient's Dexcom G6 CGM prior authorization was denied due to  "The requested device is not a Part-D eligible product as defined by the Medicare Part-D benefit and is not covered under the Part-D prescription drug plan. Coverage may be available as a Part-B medical Durable Medical Equipment benefit. Please call Durable Medical Equipment (DME) vendors [employer] at [phone] or [employer] at [phone] for assistance obtaining this device."  Provided patient with DME supplier [employer], [place]) phone numbers and advised her to contact suppliers to determine what copay would be after Medicare Part B insurance is billed. If copay is feasible, advised patient to contact office so [name] and/or I could send Dexcom G6 CGM prescription to DME supplier and to schedule an office visit to initiate Dexcom G6 CGM and provide counseling. Patient verbalized understanding.   Will await patient's call to [facility] clinic for further management of Dexcom G6 CGM.  Drexel Iha, PharmD PGY2 Ambulatory Care Pharmacy Resident

## 2019-10-03 ENCOUNTER — Other Ambulatory Visit: Payer: Self-pay

## 2019-10-03 DIAGNOSIS — IMO0002 Reserved for concepts with insufficient information to code with codable children: Secondary | ICD-10-CM

## 2019-10-03 DIAGNOSIS — E1165 Type 2 diabetes mellitus with hyperglycemia: Secondary | ICD-10-CM

## 2019-10-04 ENCOUNTER — Other Ambulatory Visit: Payer: Self-pay | Admitting: *Deleted

## 2019-10-04 DIAGNOSIS — IMO0002 Reserved for concepts with insufficient information to code with codable children: Secondary | ICD-10-CM

## 2019-10-04 DIAGNOSIS — E1165 Type 2 diabetes mellitus with hyperglycemia: Secondary | ICD-10-CM

## 2019-10-04 MED ORDER — CANAGLIFLOZIN 300 MG PO TABS: 300.0000 mg | ORAL_TABLET | Freq: Every day | ORAL | 2 refills | Status: DC

## 2019-10-06 ENCOUNTER — Other Ambulatory Visit: Payer: Self-pay | Admitting: Family Medicine

## 2019-10-06 ENCOUNTER — Telehealth: Payer: Self-pay | Admitting: *Deleted

## 2019-10-06 DIAGNOSIS — E1165 Type 2 diabetes mellitus with hyperglycemia: Secondary | ICD-10-CM

## 2019-10-06 DIAGNOSIS — IMO0002 Reserved for concepts with insufficient information to code with codable children: Secondary | ICD-10-CM

## 2019-10-06 MED ORDER — EMPAGLIFLOZIN 25 MG PO TABS: 25.0000 mg | ORAL_TABLET | Freq: Every day | ORAL | 3 refills | Status: DC

## 2019-10-06 NOTE — Telephone Encounter (Signed)
We can transition her to Jardiance 25mg  QD. I will send in a new script to her pharmacy.

## 2019-10-06 NOTE — Telephone Encounter (Signed)
See below for message about switching to a preferred med.  If you prefer not to change med let "RN Team" know and we can pursue a PA.       Received fax from pharmacy, PA needed on [employer].   Cover My Meds info: [id number]  Lovada Barwick, CMA

## 2019-10-06 NOTE — Progress Notes (Signed)
Invokana not covered by patient insurance. Will transition to Jardiance 25mg  QD. Patient informed. Follow up scheduled for 3/19.

## 2019-10-07 NOTE — Telephone Encounter (Signed)
LMOVM for pt to return call.  LMOVM of pharmacy informing them of the change.  Christen Bame, CMA

## 2019-10-17 ENCOUNTER — Other Ambulatory Visit: Payer: Self-pay

## 2019-10-17 ENCOUNTER — Encounter: Payer: Medicare Other | Attending: Family Medicine | Admitting: Skilled Nursing Facility1

## 2019-10-17 DIAGNOSIS — E1165 Type 2 diabetes mellitus with hyperglycemia: Secondary | ICD-10-CM | POA: Diagnosis present

## 2019-10-17 DIAGNOSIS — IMO0002 Reserved for concepts with insufficient information to code with codable children: Secondary | ICD-10-CM

## 2019-10-17 DIAGNOSIS — Z794 Long term (current) use of insulin: Secondary | ICD-10-CM | POA: Insufficient documentation

## 2019-10-17 DIAGNOSIS — E118 Type 2 diabetes mellitus with unspecified complications: Secondary | ICD-10-CM | POA: Insufficient documentation

## 2019-10-17 NOTE — Progress Notes (Signed)
  Pt states she has 2 teeth that need to be pulled which cause her pain so she chews with her back teeth not stopping her from eating foods.   Pt states she has burniung in her feet.   Pt needs concise language and limited goals at once to help her be successful.  Pt is a poor historian.  Pt states her daughter was unable to come due to her baby being sick. Pt arrives with a cane and limited mobility due to back pain.  Pt states she is now taking jardiance. Pt states she is still having blurry vision (asking how can I lower my A1C so I can get glasses-dietitian reminded pt everything she has worked on is to lower her A1C).   Pt states she checks her blood sugars 2 times a day: 144, 116, 170, 233, 265 155 (same from last time). Pt states her eye doctor will not prescribed her glasses until she gets her A1C down.  Pt states she has been drinking more water: 5 glasses throughout the week.  Pt states she made an appointment with her dentist but cannot afford to the get the rotten tooth pulled. Pt states she has been flossing every day.  Pt states she gets starving in the middle of the night and has tried to just eat a yogurt stating it is going to be really hard to not eat anything because she is so hungry.  Pt has a hard time making changes but is definitely putting in the effort. Pt states she got in an argument because pts daughter did not believe dietitian advised pt to eat 2 sandwiches: Dietitian clarified her daughter was correct 2 sandwiches in one sitting is not advised as it is too many calories/carbohydrate at once.   24 hr recall: Breakfast: coffee with 3 splenda and skim milk; fast food Crackers+ Lunch: fast food Crackers+ Dinner: chicken with salad  Eating throughout the night  Beverages: orange juice, mocha drink, water, flavored water, sweet tea  Education topics: Avoidance of high sugar drinks for better glucose control Avoidance of eating in the middle of the night unless  a low blood sugar) Testing in the middle of the night when hungry to ensure it is not a low Importance of drinking enough water for overall health  Goals: Aim for 1 [place] water a day and one fruit flavored water a day Continued: Make an appointment with the foot doctor Continue to [place] every day Continued: Check your feet every day: looking for anything that was not there the day before Continued: Work on not eating or putting anything in your mouth between 12am-7am; check your blood sugar if you are feeling hungry in the middle of the night Continue to limit yourself to 1 sandwich at a time with 1 small bag of chips Continued: Go through your arm chair exercises once every day  Stop drinking orange juice and tea

## 2019-10-20 NOTE — Progress Notes (Addendum)
Subjective:   Patient ID: [name]    DOB: 21-Oct-1955, 64 y.o. female   MRN: [id number]  [name] is a 64 y.o. female with a history of essential HTN, A-fib, mild OSA, uncontrolled T2DM, HLD, diabetic neuropathy, CKD II, depression here for diabetes follow up.  Diabetes: Last three A1C's below. Currently on Jardiance 25mg  QD, Liraglutide 1.8mg  QD, Novolog 35U with lunch and dinner, and Lantus 65U QD. Endorses compliance, however notes she ran out of Lantus and Novolog this morning thus has not taken her insulin today. Notes CBGs range 150-250.  Denies readings >300. Denies any hypoglycemia. Denies any polyuria, polydipsia, polyphagia. Due for diabetic eye exam. She has been approved for a continuous glucose monitor. She said she has it at home. She has also been working with nutritionist, last seen on 3/15. She is still eating fast food and eating throughout the night. Her next appointment is scheduled for 4/13.  Lab Results  Component Value Date   HGBA1C 9.6 (A) 10/21/2019   HGBA1C 13.7 (A) 07/11/2019   HGBA1C 11.0 (A) 06/21/2018    HTN:  BP: (!) 147/80 today. Currently on Amlodipine 5mg  QD, Lisinopril 40mg  QD. Endorses compliance. Denies any chest pain, SOB, vision changes, or headaches. She notes that her back is hurting a lot and she thinks it is causing her blood pressure to be up.   Mild OSA, chronic: Last sleep study in 2015 noted to have mild OSA. Recommended Mask: Fisher Paykel Eson Nasal Mask, Size: Medium. CPAP settings: 9cm H20 EPR 2 heated humitity. O2: room air She notes she does not use her CPAP as her hose to her mask had a hole in it. She notes daytime sleepiness and frequent nighttime arousals. She notes her daughter says she snores.  Chronic low back pain:  Patient has chronic low back pain. She notes sometimes radiates down her leg. Pain is worse when she stands and walks. Leaning forward makes the pain worse, lying flat on her back makes it much worse. She  notes urinary incontinence occasionally but denies any bowel incontinence Denies any saddle anesthesia. Has been treating with Gabapentin 300mg  TID, Tylenol, advil, ibuprofen with only mild improvement for about 1 hour.  Lumbar Spine X-ray 2018: Moderate to severe posterior facet arthropathy causing multilevel mild posterior listhesis. Milder degenerative disc disease.  Review of Systems:  Per HPI.   Objective:   BP (!) 147/80   Pulse 79   Wt 245 lb 12.8 oz (111.5 kg)   SpO2 99%   BMI 45.69 kg/m  Vitals and nursing note reviewed.  General: Pleasant obese female, appears uncomfortable, in no acute distress with non-toxic appearance Resp: breading comfortably on room air, speaking in full sentences Skin: warm, dry Extremities: warm and well perfused Neuro: Alert and oriented, speech normal  Back: No gross deformity, scoliosis. TTP along lower lumbar spine along bones. Some tenderness to paraspinal muscles on the right. Limited ROM due to pain. Strength LEs 5/5 all muscle groups.   2+ MSRs in right patellar and achilles tendons, equal bilaterally. 1+ patellar reflex on the left Negative SLRs. Sensation intact to light touch bilaterally. Negative fabers and piriformis stretches.  Assessment & Plan:   Uncontrolled diabetes mellitus with complication, with long-term current use of insulin (HCC) A1C much improved - 9.6 today, down from 13.7 in Dec 2020.  CBGs range in the 150s - 250s, fasting blood glucose primarily in the 250s.  She does continue to eat overnight which is likely contributing  to her elevated fasting blood sugars in the morning.  She is working with a nutritionist.  Discussed management options with [name] and opted to break up her Lantus to twice daily dosing. -Change Lantus to 40 units every morning and 38 units every evening -Continue NovoLog 35 units with lunch and dinner -Continue Jardiance and liraglutide as prescribed -Has received the continuous blood  glucose monitor.  He has been scheduled with pharmacy clinic on 3/25 to review how to use the machine.  She will also follow-up for her diabetes at that time.  -Discussed seeing ophthalmologist for diabetic eye exam as she has endorsed blurry vision.  She plans on scheduling this appointment. -Virtual diabetes follow-up scheduled for 4/27 with me.  Essential hypertension Continues to remain above goal.  Pain could be contributing however given her comorbidities, would like better control.   - Will increase her amlodipine to 10 mg daily. -Continue lisinopril 40 mg daily -Patient instructed to monitor blood pressures at home -Follow-up in 1 month  Mild obstructive sleep apnea History of mild OSA.  Last sleep study in 2015.  Has not been compliant with CPAP.  Notes she has had a leak in her CPAP hose.  Believe better control of her OSA would improve her sleep and blood pressure.  Will have patient repeat sleep study and CPAP titration. -Referral to sleep study clinic  Chronic midline low back pain Chronic, uncontrolled, severe.  Prior imaging notable for moderate to severe posterior facet arthropathy and mild degenerative disc disease. Symptoms and physical exam concerning for spinal stenosis. No red flag symptoms.  No improvement with gabapentin.  Discussed management options including repeat imaging versus pain management versus physical therapy vs referral to sports medicine/orthopedics. Opted for pain management and physical therapy at this time.  -Increase gabapentin to 600 mg 3 times daily -Trial of Robaxin 750 mg: 1 to 2 tablets QID as needed for pain.  Patient cautioned of potential side effects and to avoid driving or operating heavy machinery when using. -OTC Tylenol/ibuprofen as needed for breakthrough pain -If above fails to improve overall quality of life for patient, can consider small RX of tramadol for severe pain as needed. - Referral to physical therapy   Orders Placed This  Encounter  Procedures  . Basic Metabolic Panel  . Ambulatory referral to Sleep Studies    Referral Priority:   Routine    Referral Type:   Consultation    Referral Reason:   Specialty Services Required    Number of Visits Requested:   1  . Ambulatory referral to Physical Therapy    Referral Priority:   Routine    Referral Type:   Physical Medicine    Referral Reason:   Specialty Services Required    Requested Specialty:   Physical Therapy    Number of Visits Requested:   1  . POCT glycosylated hemoglobin (Hb A1C)   Meds ordered this encounter  Medications  . amLODipine (NORVASC) 10 MG tablet    Sig: Take 1 tablet (10 mg total) by mouth daily.    Dispense:  90 tablet    Refill:  2  . insulin glargine (LANTUS SOLOSTAR) 100 UNIT/ML Solostar Pen    Sig: Inject 80 Units into the skin 2 (two) times daily. Take 40 units every morning and take 38 units every evening    Dispense:  30 mL    Refill:  3  . methocarbamol (ROBAXIN) 750 MG tablet    Sig: Take 1-2 tablets  up to 4 times a day as needed for back pain.    Dispense:  90 tablet    Refill:  1  . gabapentin (NEURONTIN) 300 MG capsule    Sig: Take 2 capsules (600 mg total) by mouth 3 (three) times daily. As needed for back pain    Dispense:  90 capsule    Refill:  [place], DO PGY-2, [facility] 10/21/2019 5:03 PM

## 2019-10-21 ENCOUNTER — Other Ambulatory Visit: Payer: Self-pay

## 2019-10-21 ENCOUNTER — Encounter: Payer: Self-pay | Admitting: Family Medicine

## 2019-10-21 ENCOUNTER — Ambulatory Visit (INDEPENDENT_AMBULATORY_CARE_PROVIDER_SITE_OTHER): Payer: Medicare Other | Admitting: Family Medicine

## 2019-10-21 VITALS — BP 147/80 | HR 79 | Wt 245.8 lb

## 2019-10-21 DIAGNOSIS — I10 Essential (primary) hypertension: Secondary | ICD-10-CM | POA: Diagnosis not present

## 2019-10-21 DIAGNOSIS — E1165 Type 2 diabetes mellitus with hyperglycemia: Secondary | ICD-10-CM

## 2019-10-21 DIAGNOSIS — G8929 Other chronic pain: Secondary | ICD-10-CM

## 2019-10-21 DIAGNOSIS — G4733 Obstructive sleep apnea (adult) (pediatric): Secondary | ICD-10-CM

## 2019-10-21 DIAGNOSIS — E1142 Type 2 diabetes mellitus with diabetic polyneuropathy: Secondary | ICD-10-CM

## 2019-10-21 DIAGNOSIS — Z794 Long term (current) use of insulin: Secondary | ICD-10-CM

## 2019-10-21 DIAGNOSIS — E118 Type 2 diabetes mellitus with unspecified complications: Secondary | ICD-10-CM | POA: Diagnosis not present

## 2019-10-21 DIAGNOSIS — M545 Low back pain: Secondary | ICD-10-CM | POA: Diagnosis not present

## 2019-10-21 DIAGNOSIS — IMO0002 Reserved for concepts with insufficient information to code with codable children: Secondary | ICD-10-CM

## 2019-10-21 LAB — POCT GLYCOSYLATED HEMOGLOBIN (HGB A1C): HbA1c, POC (controlled diabetic range): 9.6 % — AB (ref 0.0–7.0)

## 2019-10-21 MED ORDER — GABAPENTIN 300 MG PO CAPS: 600.0000 mg | ORAL_CAPSULE | Freq: Three times a day (TID) | ORAL | 3 refills | Status: DC

## 2019-10-21 MED ORDER — AMLODIPINE BESYLATE 10 MG PO TABS: 10.0000 mg | ORAL_TABLET | Freq: Every day | ORAL | 2 refills | Status: DC

## 2019-10-21 MED ORDER — METHOCARBAMOL 750 MG PO TABS: ORAL_TABLET | ORAL | 1 refills | Status: DC

## 2019-10-21 MED ORDER — LANTUS SOLOSTAR 100 UNIT/ML ~~LOC~~ SOPN: 80.0000 [IU] | PEN_INJECTOR | Freq: Two times a day (BID) | SUBCUTANEOUS | 3 refills | Status: DC

## 2019-10-21 NOTE — Assessment & Plan Note (Signed)
History of mild OSA.  Last sleep study in 2015.  Has not been compliant with CPAP.  Notes she has had a leak in her CPAP hose.  Believe better control of her OSA would improve her sleep and blood pressure.  Will have patient repeat sleep study and CPAP titration. -Referral to sleep study clinic

## 2019-10-21 NOTE — Patient Instructions (Addendum)
Thank you for coming to see me today. It was a pleasure to see you.   Diabetes: Please take 40 Units of Lantus every morning and 38 units every evening  Continue Novolog 35units with lunch and dinner  Continue the Jardiance and Victoza as normal Follow up with [name] next Thurday, 10/27/19 at 2:45pm. Please be sure to bring your meter with you.  Sleep Apnea: I have placed a referral for a sleep study for you to be fitted for a CPAP which will help your blood pressure and your sleep.  Blood Pressure: I will increase your Amlodipine to 10mg .   Back: For your back pain: I have increased your Gabapentin to 600mg  three times a day as needed. I have also sent in an prescription for a muscle relaxer. You can take 1-2 tablets of this up to 4 times a day as needed for back pain. This may make you sleepy so avoid driving or operating machines. Please let me know how this goes and we can consider an additional agent to help with your pain.  Follow up with me on 11/29/19 at 1:50 pm for diabetes follow up.  If you have any questions or concerns, please do not hesitate to call the office at [phone].  Take Care,   Dr. Mina Marble, DO Resident Physician [place] [phone]

## 2019-10-21 NOTE — Assessment & Plan Note (Signed)
>>  ASSESSMENT AND PLAN FOR UNCONTROLLED DIABETES MELLITUS WITH COMPLICATION, WITH LONG-TERM CURRENT USE OF INSULIN WRITTEN ON 10/21/2019  4:52 PM BY MULLIS, KIERSTEN P, DO  A1C much improved - 9.6 today, down from 13.7 in Dec 2020.  CBGs range in the 150s - 250s, fasting blood glucose primarily in the 250s.  She does continue to eat overnight which is likely contributing to her elevated fasting blood sugars in the morning.  She is working with a nutritionist.  Discussed management options with [name] and opted to break up her Lantus to twice daily dosing. -Change Lantus to 40 units every morning and 38 units every evening -Continue NovoLog 35 units with lunch and dinner -Continue Jardiance and liraglutide as prescribed -Has received the continuous blood glucose monitor.  He has been scheduled with pharmacy clinic on 3/25 to review how to use the machine.  She will also follow-up for her diabetes at that time.  -Discussed seeing ophthalmologist for diabetic eye exam as she has endorsed blurry vision.  She plans on scheduling this appointment. -Virtual diabetes follow-up scheduled for 4/27 with me.

## 2019-10-21 NOTE — Assessment & Plan Note (Signed)
Chronic, uncontrolled, severe.  Prior imaging notable for moderate to severe posterior facet arthropathy and mild degenerative disc disease. Symptoms and physical exam concerning for spinal stenosis. No red flag symptoms.  No improvement with gabapentin.  Discussed management options including repeat imaging versus pain management versus physical therapy vs referral to sports medicine/orthopedics. Opted for pain management and physical therapy at this time.  -Increase gabapentin to 600 mg 3 times daily -Trial of Robaxin 750 mg: 1 to 2 tablets QID as needed for pain.  Patient cautioned of potential side effects and to avoid driving or operating heavy machinery when using. -OTC Tylenol/ibuprofen as needed for breakthrough pain -If above fails to improve overall quality of life for patient, can consider small RX of tramadol for severe pain as needed. - Referral to physical therapy

## 2019-10-21 NOTE — Assessment & Plan Note (Signed)
A1C much improved - 9.6 today, down from 13.7 in Dec 2020.  CBGs range in the 150s - 250s, fasting blood glucose primarily in the 250s.  She does continue to eat overnight which is likely contributing to her elevated fasting blood sugars in the morning.  She is working with a nutritionist.  Discussed management options with [name] and opted to break up her Lantus to twice daily dosing. -Change Lantus to 40 units every morning and 38 units every evening -Continue NovoLog 35 units with lunch and dinner -Continue Jardiance and liraglutide as prescribed -Has received the continuous blood glucose monitor.  He has been scheduled with pharmacy clinic on 3/25 to review how to use the machine.  She will also follow-up for her diabetes at that time.  -Discussed seeing ophthalmologist for diabetic eye exam as she has endorsed blurry vision.  She plans on scheduling this appointment. -Virtual diabetes follow-up scheduled for 4/27 with me.

## 2019-10-21 NOTE — Assessment & Plan Note (Signed)
Continues to remain above goal.  Pain could be contributing however given her comorbidities, would like better control.   - Will increase her amlodipine to 10 mg daily. -Continue lisinopril 40 mg daily -Patient instructed to monitor blood pressures at home -Follow-up in 1 month

## 2019-10-22 LAB — BASIC METABOLIC PANEL
BUN/Creatinine Ratio: 13 (ref 12–28)
BUN: 14 mg/dL (ref 8–27)
CO2: 23 mmol/L (ref 20–29)
Calcium: 9.7 mg/dL (ref 8.7–10.3)
Chloride: 105 mmol/L (ref 96–106)
Creatinine, Ser: 1.07 mg/dL — ABNORMAL HIGH (ref 0.57–1.00)
GFR calc Af Amer: 63 mL/min/{1.73_m2} (ref >59)
GFR calc non Af Amer: 55 mL/min/{1.73_m2} — ABNORMAL LOW (ref >59)
Glucose: 174 mg/dL — ABNORMAL HIGH (ref 65–99)
Potassium: 4.7 mmol/L (ref 3.5–5.2)
Sodium: 141 mmol/L (ref 134–144)

## 2019-10-27 ENCOUNTER — Ambulatory Visit (INDEPENDENT_AMBULATORY_CARE_PROVIDER_SITE_OTHER): Payer: Medicare Other | Admitting: Pharmacist

## 2019-10-27 ENCOUNTER — Other Ambulatory Visit: Payer: Self-pay

## 2019-10-27 DIAGNOSIS — Z794 Long term (current) use of insulin: Secondary | ICD-10-CM

## 2019-10-27 DIAGNOSIS — I10 Essential (primary) hypertension: Secondary | ICD-10-CM

## 2019-10-27 DIAGNOSIS — E1165 Type 2 diabetes mellitus with hyperglycemia: Secondary | ICD-10-CM | POA: Diagnosis not present

## 2019-10-27 DIAGNOSIS — E118 Type 2 diabetes mellitus with unspecified complications: Secondary | ICD-10-CM | POA: Diagnosis not present

## 2019-10-27 DIAGNOSIS — IMO0002 Reserved for concepts with insufficient information to code with codable children: Secondary | ICD-10-CM

## 2019-10-27 MED ORDER — DEXCOM G6 RECEIVER DEVI: 1.0000 | 2 refills | Status: DC

## 2019-10-27 MED ORDER — DEXCOM G6 SENSOR MISC: 3.0000 | 11 refills | Status: DC

## 2019-10-27 MED ORDER — DEXCOM G6 TRANSMITTER MISC: 1.0000 | 3 refills | Status: DC

## 2019-10-27 NOTE — Progress Notes (Signed)
S:     Chief Complaint  Patient presents with  . Medication Management    diabetes - CGM    Patient arrives in good spirits.  She unfortunately waited almost 1 hour to be seen due to a scheduling error (names was placed on [facility]-pharmacy list).  Presents for diabetes management and Dexcom G6 application. Patient was referred and last seen by Primary Care Provider, Dr. Tarry Kos 10/21/2019.  Patient is unsure the difference between her rapid acting (NovoLog) and long acting insulin (Lantus)  She reports she would like to keep all Dexcom alerts/alarms on, especially when BG is high (>200 mg/dL). She states she thinks if she knows her BG readings are high it will help with her diet.   Patient taking >1 gram acetaminophen every 6 hours: NO Patient taking hydroxyrea: NO  Patient reports Diabetes was diagnosed in 2010.  Insurance coverage/medication affordability: [employer] (Medicare) -Patient receives Dexcom G6 CGM via DME supplier ([employer])  Patient reports adherence with medications.  Current diabetes medications include: Liraglutide 1.8 mg subQ daily, Empagliflozin 25 mg daily, Novolog 35 units twice daily and Lantus 80 units twice daily. Current hypertension medications include: amlodipine 10 mg daily and lisinopril 40 mg daily Current hyperlipidemia medications include: atorvastatin 80 mg daily   Patient denies hypoglycemic events.  Patient reported dietary habits: Eats 2 meals/day  Patient-reported exercise habits: limited by back pain   Dexcom G6 patient education Person(s) instructed: patient  Instruction: Patient oriented to three components of Dexcom G6 continuous glucose monitor (sensor, transmitter, receiver/cellphone) Receiver or cellphone: receiver -Patient educated that Dexom G6 app must always be running (patient should not close out of app) -If using Dexcom G6 app, patient may share blood glucose data with up to 10 followers on dexcom follow app. Sensor code:  [phone] Transmitter code: [id number]  CGM overview and set-up  1. Button, touch screen, and icons 2. Power supply and recharging 3. Home screen 4. Date and time 5. Set BG target range: 70-200 mg/dL 6. Set alarm/alert tone  7. Interstitial vs. capillary blood glucose readings  8. When to verify sensor reading with fingerstick blood glucose 9. Blood glucose reading measured every five minutes. 10. Sensor will last 10 days 11. Transmitter will last 90 days and must be reused  12. Transmitter must be within 20 feet of receiver/cell phone.  Sensor application -- sensor placed on left side of abdomen 1. Site selection and site prep with alcohol pad 2. Sensor prep-sensor pack and sensor applicator 3. Sensor applied to area away from waistband, scarring, tattoos, irritation, and bones 4. Transmitter sanitized with alcohol pad and inserted into sensor. 5. Starting the sensor: 2 hour warm up before BG readings available 6. Sensor change every 10 days and rotate site 7. Call Dexcom customer service if sensor comes off before 10 days  Safety and Troubleshooting 1. Do a fingerstick blood glucose test if the sensor readings do not match how    you feel 2. Remove sensor prior to magnetic resonance imaging (MRI), computed tomography (CT) scan, or high-frequency electrical heat (diathermy) treatment. 3. Do not allow sun screen or insect repellant to come into contact with Dexcom G6. These skin care products may lead for the plastic used in the Dexcom G6 to crack. 4. Dexcom G6 may be worn through a Environmental education officer. It may not be exposed to an advanced Imaging Technology (AIT) body scanner (also called a millimeter wave scanner) or the baggage x-ray machine. Instead, ask for hand-wanding or full-body  pat-down and visual inspection.  5. Doses of acetaminophen (Tylenol) >1 gram every 6 hours may cause false high readings. 6. Hydroxyurea (Hydrea, Droxia) may interfere with accuracy of blood glucose  readings from Dexcom G6. 7. Store sensor kit between 36 and 86 degrees Farenheit. Can be refrigerated within this temperature range.  Contact information provided for [facility] customer service and/or trainer.  O:  Physical Exam Constitutional:      Appearance: Normal appearance. She is obese.  Psychiatric:        Mood and Affect: Mood normal.        Behavior: Behavior normal.        Thought Content: Thought content normal.        Judgment: Judgment normal.      Review of Systems  All other systems reviewed and are negative.    Lab Results  Component Value Date   HGBA1C 9.6 (A) 10/21/2019   Vitals:   10/27/19 1646  BP: (!) 140/96  Pulse: 92  SpO2: 96%    Lipid Panel     Component Value Date/Time   CHOL 189 07/11/2019 1710   TRIG 139 07/11/2019 1710   HDL 46 07/11/2019 1710   CHOLHDL 4.1 07/11/2019 1710   CHOLHDL 3.3 09/16/2017 0302   VLDL 16 09/16/2017 0302   LDLCALC 118 (H) 07/11/2019 1710   LDLDIRECT 51 03/27/2016 1614    Patient states her BG are around 200 mg/dL most of the time.  Clinical Atherosclerotic Cardiovascular Disease (ASCVD): No  The 10-year ASCVD risk score Mikey Bussing DC Jr., et al., 2013) is: 23.9%   Values used to calculate the score:     Age: 64 years     Sex: Female     Is Non-Hispanic African American: Yes     Diabetic: Yes     Tobacco smoker: No     Systolic Blood Pressure: 662 mmHg     Is BP treated: Yes     HDL Cholesterol: 46 mg/dL     Total Cholesterol: 189 mg/dL    A/P: Diabetes  Diabetes longstanding since 2010 currently uncontrolled. Patient reports adherent with medication - however, it is important to note she is confused regarding difference between rapid acting and long acting insulin. Control is suboptimal due to unhealthy diet, lack of exercise, and health literacy. Dexcom G6 placed on patient's left side of abdomen successfully.  -To keep medication regimen as simple as possible considering lengthy amount of time to counsel  on using Dexcom CGM will continue all DM medications for now (Liraglutide 1.8 mg subQ daily, Empagliflozin 25 mg daily, Novolog 35 units twice daily and Lantus 80 units twice daily). Stressed the importance of being able to differentiate between rapid and long acting insulin. Patient verbalized understanding. -Extensively discussed pathophysiology of diabetes, recommended lifestyle interventions, dietary effects on blood sugar control -Scheduled follow up office visit with pharmacy team to thoroughly discuss Dexcom Clarity Report on 11/24/2019  -Next A1C anticipated 01/2020.   ASCVD risk  ASCVD risk - primary prevention in patient with diabetes. Last LDL is controlled. ASCVD risk score is >20%  - high intensity statin indicated. Aspirin could be considered. -Defer risk vs benefit discussion of aspirin initiation to follow up appt -Continued atorvastatin 80 mg.   HTN  Hypertension longstanding currently uncontrolled.  Blood pressure goal = <130/80 mmHg. Patient reports medication adherence.  Blood pressure control is suboptimal due to unhealthy diet and lack of exercise. To keep medication regimen as simple as possible considering lengthy amount of  time to counsel on using Dexcom CGM will address at follow up appt on 11/24/2019.   -Continue lisinopril 40 mg daily and amlodipine 10 mg daily  Written patient instructions provided.  Total time in face to face counseling 60 minutes.   Follow up Pharmacist Clinic Visit on 11/24/2019.   Patient seen with Drexel Iha, PharmD,  PGY2 Pharmacy Resident.

## 2019-10-27 NOTE — Patient Instructions (Addendum)
It was a pleasure seeing you in clinic today!  Please contact [name] the pharmacist if you have any concerns about your concerns   Your MEAL time insulin is NOVOLOG or insulin aspart 35 units twice daily before meals  Your LONG acting insulin is Lantus or insulin glargine 80 units twice daily before meals   PLEASE remember to bring your Dexcom charger at your next pharmacy appt  Today the plan is...  Please call the PharmD clinic at [phone] if you have any questions that you would like to speak with a pharmacist about (Dr. Nicholas Lose or Dr. Drexel Iha).   Please remember... 1. Sensor will last 10 days 2. Transmitter will last 90 days and must be reused 3. Sensor should be applied to area away from waistband, scarring, tattoos, irritation, and bones. 4. Transmitter must be within 20 feet of receiver/cell phone. 5. If using Dexcom G6 app on cell phone, please remember to keep app open (do not close out of app). 6. Do a fingerstick blood glucose test if the sensor readings do not match how    you feel 7. Remove sensor prior to magnetic resonance imaging (MRI), computed tomography (CT) scan, or high-frequency electrical heat (diathermy) treatment. 8. Do not allow sun screen or insect repellant to come into contact with Dexcom G6. These skin care products may lead for the plastic used in the Dexcom G6 to crack. 9. Dexcom G6 may be worn through a Environmental education officer. It may not be exposed to an advanced Imaging Technology (AIT) body scanner (also called a millimeter wave scanner) or the baggage x-ray machine. Instead, ask for hand-wanding or full-body pat-down and visual inspection.  10. Doses of acetaminophen (Tylenol) >1 gram every 6 hours may cause false high readings. 11. Hydroxyurea (Hydrea, Droxia) may interfere with accuracy of blood glucose readings from Dexcom G6. 12. Store sensor kit between 36 and 86 degrees Farenheit. Can be refrigerated within this temperature  range.   Dexcom Customer Service Information 1. Customer Sales Support (dexcom orders and general customer questions) Phone number: [phone] Monday - Friday  6 AM - 5 PM PST Saturday 8 AM - 4 PM PST   2. Global Technical Support (product troubleshooting or replacement inquiries) Phone number: [phone] Available 24 hours a day; 7 days a week  [place]  3. Dexcom Care (provides dexcom CGM training, software downloads, and tutorials) Phone number: [phone] Monday - Friday 6 AM - 5 PM PST Saturday 7 AM - 1:30 PM PST (All hours subject to change)  4. Website: [email or web address]

## 2019-10-28 NOTE — Assessment & Plan Note (Signed)
Hypertension longstanding currently uncontrolled.  Blood pressure goal = <130/80 mmHg. Patient reports medication adherence.  Blood pressure control is suboptimal due to unhealthy diet and lack of exercise. To keep medication regimen as simple as possible considering lengthy amount of time to counsel on using Dexcom CGM will address at follow up appt on 11/24/2019.   -Continue lisinopril 40 mg daily and amlodipine 10 mg daily If BP continues to be elevated greater than goal, suggest restart of aldosterone antagonist tx.

## 2019-10-31 NOTE — Progress Notes (Signed)
Reviewed: I agree with Dr. Koval's documentation and management. 

## 2019-11-15 ENCOUNTER — Ambulatory Visit: Payer: Medicare Other | Admitting: Skilled Nursing Facility1

## 2019-11-16 ENCOUNTER — Ambulatory Visit: Payer: Medicare Other | Attending: Family Medicine | Admitting: Physical Therapy

## 2019-11-24 ENCOUNTER — Ambulatory Visit: Payer: Medicare Other | Admitting: Pharmacist

## 2019-11-28 ENCOUNTER — Ambulatory Visit (INDEPENDENT_AMBULATORY_CARE_PROVIDER_SITE_OTHER): Payer: Medicare Other | Admitting: Pharmacist

## 2019-11-28 ENCOUNTER — Other Ambulatory Visit: Payer: Self-pay

## 2019-11-28 VITALS — BP 162/96 | HR 78 | Ht 62.0 in | Wt 243.0 lb

## 2019-11-28 DIAGNOSIS — I10 Essential (primary) hypertension: Secondary | ICD-10-CM

## 2019-11-28 DIAGNOSIS — E118 Type 2 diabetes mellitus with unspecified complications: Secondary | ICD-10-CM

## 2019-11-28 DIAGNOSIS — E1165 Type 2 diabetes mellitus with hyperglycemia: Secondary | ICD-10-CM

## 2019-11-28 DIAGNOSIS — E785 Hyperlipidemia, unspecified: Secondary | ICD-10-CM

## 2019-11-28 DIAGNOSIS — E1169 Type 2 diabetes mellitus with other specified complication: Secondary | ICD-10-CM | POA: Diagnosis not present

## 2019-11-28 DIAGNOSIS — F32A Depression, unspecified: Secondary | ICD-10-CM

## 2019-11-28 DIAGNOSIS — F329 Major depressive disorder, single episode, unspecified: Secondary | ICD-10-CM

## 2019-11-28 DIAGNOSIS — IMO0002 Reserved for concepts with insufficient information to code with codable children: Secondary | ICD-10-CM

## 2019-11-28 DIAGNOSIS — Z794 Long term (current) use of insulin: Secondary | ICD-10-CM

## 2019-11-28 MED ORDER — INDAPAMIDE 2.5 MG PO TABS: 2.5000 mg | ORAL_TABLET | Freq: Every day | ORAL | 3 refills | Status: DC

## 2019-11-28 MED ORDER — AMLODIPINE BESYLATE 10 MG PO TABS: 10.0000 mg | ORAL_TABLET | Freq: Every day | ORAL | 2 refills | Status: DC

## 2019-11-28 NOTE — Progress Notes (Signed)
[facility] Telemedicine Visit  Patient consented to have virtual visit and was identified by name and date of birth. Method of visit: Telephone  Encounter participants: Patient: [name] - located at home Provider: Danna [name] - located at [facility] Others (if applicable): None  Chief Complaint: Diabetes follow up   HPI: Uncontrolled Type 2 Diabetes: Patient here today to follow-up for diabetes. She recently obtained Dexcom G6 for better glucose monitoring.  Currently taking Lantus 65 units daily, NovoLog 35 units twice daily with meals, Victoza 1.8 mg daily, Jardiance 25 mg daily. She was transitioned to Lantus to 40 units every morning and 38 units every evening to cover for overnight foods, however she was transitioned back for easiness. Endorses compliance. Notes CBGs range 100-229. Denies any hypoglycemia. Denies any polyuria, polydipsia, polyphagia. She had visit with Dr. Valentina [name] on 4/26. Denies any readings <100. Notes Dr. Valentina [name] reset her meter to notify her when the sugars are >220 which she appreciates. She has cut down on some of the processed sugar she was consuming. She notes she is enjoying the Dexcom, feels like she is getting closer to her goal.   She notes she fell last week. She was getting up from a chair when it gave out on her. She notes she did not hit her head or loose consciousness. She denies ever having symptoms. Denies any headaches or vision changes since.  Health Maintenance: Due for COVID vaccine and TDAP She notes she got the first [place] shot on 11/21/19. Her next vaccine in due for 12/10/19.    ROS: per HPI  Pertinent PMHx: Uncontrolled Type 2 diabetes on long term insulin, CKD II  Exam:  Respiratory: Breathing comfortably, speaking in full sentences  Assessment/Plan:  Uncontrolled diabetes mellitus with complication, with long-term current use of insulin ([place]) Follow up today for uncontrolled diabetes. Patient is currently  using DexCom for continuous glucose monitoring. Followed up with Dr. Valentina [name] on 4/26 to evaluate DexCom device which is working well. CBG's noted to be ranging from 100-229 which is much improved from prior readings. Denies any hypoglycemia. Recently transitioned from BID Lantus to QD Lantus for ease of patient. At this time, will conitnue current management with plan to follow up in June 2021 for repeat A1C. Appointment scheduled. - Continue Lantus 65 units QD - continue Victoza 1.8mg  QD - Continue Jardiance 25mg  QD - Continue Novolog 35 U with meals - Continue ACE and Statin as prescribed - due for PNA vaccine in May 2021 - Follow up June 2021 for repeat A1C, appointment scheduled   Time spent during visit with patient: 30 minutes  [name], [place], PGY2 12/01/2019 10:58 AM

## 2019-11-28 NOTE — Patient Instructions (Signed)
It was great to see you today, [name].  Continue wearing the DexCom G6 and continue the same insulin regimen. We have adjusted your high alert to 220 so the receiver will not alarm you as frequently.  For your blood pressure, start taking indapamide 2.5 mg daily in addition to amlodipine 10 mg daily and lisinopril 40 mg daily.  Follow up with your PCP or with pharmacy in a month.

## 2019-11-28 NOTE — Assessment & Plan Note (Signed)
>>  ASSESSMENT AND PLAN FOR UNCONTROLLED DIABETES MELLITUS WITH COMPLICATION, WITH LONG-TERM CURRENT USE OF INSULIN WRITTEN ON 11/28/2019 11:52 AM BY KOVAL, PETER G, RPH-CPP  Diabetes longstanding currently uncontrolled. Patient is able to verbalize appropriate hypoglycemia management plan. Patient is adherent with medication. Control is suboptimal due to lifestyle. Today, patient's DexCom G6 profile was uploaded to [facility] and report reviewed with patient.  -Continued basal insulin Lantus (insulin glargine) 65 units once daily.  -Continued  rapid insulin Novolog (insulin aspart) 35 units BID before meals.  -Continued GLP-1 Victoza (generic nameliraglutide) 1.8 mg daily.  -Continued SGLT2-I Jardiance (generic name empagliflozin) 25 mg daily. -Adjusted high alert setting on DexCom G6 to 220 mg/dL to avoid alert fatigue. -Extensively discussed pathophysiology of diabetes, recommended lifestyle interventions, dietary effects on blood sugar control -Counseled on s/sx of and management of hypoglycemia -Next A1C anticipated June 2021.

## 2019-11-28 NOTE — Progress Notes (Signed)
Reviewed: I agree with the documentation and management of Dr. Koval. 

## 2019-11-28 NOTE — Progress Notes (Signed)
S:     Chief Complaint  Patient presents with  . Medication Management    diabetes, hypertension    Patient arrives in good spirits, ambulating with assistance from a cane.  Presents for diabetes evaluation, education, and management Patient was referred and last seen by Primary Care Provider on 10/2019.   Patient reports recent visit from health inspector who found mold in her house. She lives with her daughter and 4 grandchildren (65 yo, 73 yo, 42 yo, 2 months old). Housing coalition does not have housing that can accommodate her, and she cannot afford rent between $900-$1300.  Patient uses DexCom G6 for CGM. Patient reports frustration with alarms from CGM when blood sugar is 201. Patient is working with DME supplier [employer]) to obtain a new transmitter. Patient uses a Printmaker.  Patient reports Diabetes was diagnosed in 2010.   Patient reports adherence with medications.  Current diabetes medications include: Jardiance 25 mg daily, Victoza 1.8 mg daily, Lantus 65 units once daily, Novolog 35 units BID before meals Current hypertension medications include: amlodipine 10 mg daily, lisinopril 40 mg daily Current hyperlipidemia medications include: atorvastatin 80 mg daily  Patient reports hypoglycemic events when sugar in 120-140s but no readings <100.  Patient reports alarms from [facility] after eating grapes or bananas. Patient used to drink 2 cups of iced mocha coffee but has stopped, patient still drinks Sprite Zero.  Patient reports nocturia (nighttime urination) 4-5x/night Patient reports neuropathy (nerve pain) in toes. Patient reports visual changes. Patient states her eye doctor will not give her reading glasses until sugars are down.  Patient reports self foot exams.     O:  Physical Exam Constitutional:      Appearance: She is obese.  Musculoskeletal:     Right lower leg: No edema.     Left lower leg: No edema.      Review of Systems  Eyes: Positive for blurred  vision.  Neurological: Positive for tingling.       In toes   PHQ-2 score: 5   Lab Results  Component Value Date   HGBA1C 9.6 (A) 10/21/2019   Vitals:   11/28/19 1039  BP: (!) 162/96  Pulse: 78  SpO2: 98%    Lipid Panel     Component Value Date/Time   CHOL 189 07/11/2019 1710   TRIG 139 07/11/2019 1710   HDL 46 07/11/2019 1710   CHOLHDL 4.1 07/11/2019 1710   CHOLHDL 3.3 09/16/2017 0302   VLDL 16 09/16/2017 0302   LDLCALC 118 (H) 07/11/2019 1710   LDLDIRECT 51 03/27/2016 1614    DexCom G6 Readings: Average blood glucose 163 mg/dL, standard deviation 30 mg/dL Blood glucose within goal range 78% of the time, no lows.   Clinical Atherosclerotic Cardiovascular Disease (ASCVD): No  The 10-year ASCVD risk score Mikey Bussing DC Jr., et al., 2013) is: 32.6%   Values used to calculate the score:     Age: 65 years     Sex: Female     Is Non-Hispanic African American: Yes     Diabetic: Yes     Tobacco smoker: No     Systolic Blood Pressure: 0000000 mmHg     Is BP treated: Yes     HDL Cholesterol: 46 mg/dL     Total Cholesterol: 189 mg/dL    A/P: Diabetes longstanding currently uncontrolled. Patient is able to verbalize appropriate hypoglycemia management plan. Patient is adherent with medication. Control is suboptimal due to lifestyle. Today, patient's DexCom G6 profile  was uploaded to [facility] and report reviewed with patient.  -Continued basal insulin Lantus (insulin glargine) 65 units once daily.  -Continued  rapid insulin Novolog (insulin aspart) 35 units BID before meals.  -Continued GLP-1 Victoza (generic nameliraglutide) 1.8 mg daily.  -Continued SGLT2-I Jardiance (generic name empagliflozin) 25 mg daily. -Adjusted high alert setting on DexCom G6 to 220 mg/dL to avoid alert fatigue. -Extensively discussed pathophysiology of diabetes, recommended lifestyle interventions, dietary effects on blood sugar control -Counseled on s/sx of and management of hypoglycemia -Next A1C  anticipated June 2021.   Hypertension longstanding currently uncontrolled.  Blood pressure goal = 130/80 mmHg. Patient reports medication adherence.  Blood pressure control is suboptimal due to lifestyle and current stressor from housing situation. -Started indapamide 2.5 mg daily.  Housing (mold in public apartment) is problematic.  Patient currently lives with daughter and 4 grandchildren.   -Referred patient to social worker Casimer Lanius for housing evaluation / assistance.   Plan: PHQ9 evaluation at PCP visit tomorrow.    Written patient instructions provided.  Total time in face to face counseling 20 minutes.   Follow up PCP visit on 4/27. Patient seen with Berenice Bouton, PharmD, PGY-1 Pharmacy Resident.

## 2019-11-28 NOTE — Assessment & Plan Note (Signed)
Diabetes longstanding currently uncontrolled. Patient is able to verbalize appropriate hypoglycemia management plan. Patient is adherent with medication. Control is suboptimal due to lifestyle. Today, patient's DexCom G6 profile was uploaded to [facility] and report reviewed with patient.  -Continued basal insulin Lantus (insulin glargine) 65 units once daily.  -Continued  rapid insulin Novolog (insulin aspart) 35 units BID before meals.  -Continued GLP-1 Victoza (generic nameliraglutide) 1.8 mg daily.  -Continued SGLT2-I Jardiance (generic name empagliflozin) 25 mg daily. -Adjusted high alert setting on DexCom G6 to 220 mg/dL to avoid alert fatigue. -Extensively discussed pathophysiology of diabetes, recommended lifestyle interventions, dietary effects on blood sugar control -Counseled on s/sx of and management of hypoglycemia -Next A1C anticipated June 2021.

## 2019-11-28 NOTE — Assessment & Plan Note (Signed)
Hypertension longstanding currently uncontrolled.  Blood pressure goal = 130/80 mmHg. Patient reports medication adherence.  Blood pressure control is suboptimal due to lifestyle and current stressor from housing situation. -Started indapamide 2.5 mg daily.

## 2019-11-29 ENCOUNTER — Telehealth (INDEPENDENT_AMBULATORY_CARE_PROVIDER_SITE_OTHER): Payer: Medicare Other | Admitting: Family Medicine

## 2019-11-29 DIAGNOSIS — Z794 Long term (current) use of insulin: Secondary | ICD-10-CM

## 2019-11-29 DIAGNOSIS — E118 Type 2 diabetes mellitus with unspecified complications: Secondary | ICD-10-CM | POA: Diagnosis not present

## 2019-11-29 DIAGNOSIS — IMO0002 Reserved for concepts with insufficient information to code with codable children: Secondary | ICD-10-CM

## 2019-11-29 DIAGNOSIS — E1165 Type 2 diabetes mellitus with hyperglycemia: Secondary | ICD-10-CM

## 2019-11-29 NOTE — Progress Notes (Signed)
No vitals taken at home.  .Elizebeth Kluesner R Ranay Ketter, CMA  

## 2019-11-30 NOTE — Addendum Note (Signed)
Addended by: Leavy Cella on: 11/30/2019 03:39 PM   Modules accepted: Orders

## 2019-12-01 NOTE — Assessment & Plan Note (Addendum)
Follow up today for uncontrolled diabetes. Patient is currently using DexCom for continuous glucose monitoring. Followed up with [name] on 4/26 to evaluate DexCom device which is working well. CBG's noted to be ranging from 100-229 which is much improved from prior readings. Denies any hypoglycemia. Recently transitioned from BID Lantus to QD Lantus for ease of patient. At this time, will conitnue current management with plan to follow up in June 2021 for repeat A1C. Appointment scheduled. - Continue Lantus 65 units QD - continue Victoza 1.8mg  QD - Continue Jardiance 25mg  QD - Continue Novolog 35 U with meals - Continue ACE and Statin as prescribed - due for PNA vaccine in May 2021 - Follow up June 2021 for repeat A1C, appointment scheduled

## 2019-12-01 NOTE — Assessment & Plan Note (Signed)
>>  ASSESSMENT AND PLAN FOR UNCONTROLLED DIABETES MELLITUS WITH COMPLICATION, WITH LONG-TERM CURRENT USE OF INSULIN WRITTEN ON 12/01/2019 10:58 AM BY MULLIS, KIERSTEN P, DO  Follow up today for uncontrolled diabetes. Patient is currently using DexCom for continuous glucose monitoring. Followed up with [name] on 4/26 to evaluate DexCom device which is working well. CBG's noted to be ranging from 100-229 which is much improved from prior readings. Denies any hypoglycemia. Recently transitioned from BID Lantus to QD Lantus for ease of patient. At this time, will conitnue current management with plan to follow up in June 2021 for repeat A1C. Appointment scheduled. - Continue Lantus 65 units QD - continue Victoza 1.8mg  QD - Continue Jardiance 25mg  QD - Continue Novolog 35 U with meals - Continue ACE and Statin as prescribed - due for PNA vaccine in May 2021 - Follow up June 2021 for repeat A1C, appointment scheduled

## 2019-12-02 ENCOUNTER — Ambulatory Visit: Payer: Self-pay | Admitting: Licensed Clinical Social Worker

## 2019-12-02 DIAGNOSIS — Z139 Encounter for screening, unspecified: Secondary | ICD-10-CM

## 2019-12-02 NOTE — Chronic Care Management (AMB) (Signed)
   Social Work Care Management  Referral Note  12/02/2019 Name: TOSHIA LARKIN MRN: [id number] DOB: Sep 07, 1955  MATISHA TERMINE is a 64 y.o. year old female who sees Danna Hefty, DO for primary care.  LCSW was consulted by [name] to assistance patient with Community Resources to locate new housing to due to problems with mold.   Referral Priority: Urgent  Review of patient status, including review of consultants reports, relevant laboratory and other test results, as well as collaboration with appropriate care team members,  and the patient's provider was performed as part of comprehensive patient evaluation and provision of chronic care management services.    Recommendation: LCSW reviewed referral and based on the chart review determined no clinical needs are identified for this patient. Patient's needs are best met by CCM care guides.  Plan:  1. Patient is being referred to the Care Guide for assistance with community resources for housing options.  2.   The Care Guide will contact the Care Management team if clinical needs are identified   Casimer Lanius, [place] / [place]   [phone] 2:16 PM

## 2019-12-08 ENCOUNTER — Telehealth: Payer: Self-pay | Admitting: Family Medicine

## 2019-12-08 NOTE — Telephone Encounter (Signed)
  Community Resource Referral   [place] 12/08/2019 1st Attempt  Name: [name]   MRN: [id number]   DOB: February 01, 1956   AGE: 64 y.o.   GENDER: female   PCP Mullis, Kiersten P, DO.   Called pt regarding Community Resource Referral for housing and mold in apartment LMTCB Follow up on: 12/09/2019  [place] . [email or web address]  [phone]    Please contact patient for support of any housing options available to her as an alternative to the current situation for her and her family (daughter plus five grandchildren).  House currently has mold. Currently live in an apartment that was recently inspected and determined to have MOLD.  She was told by the "COUNTY" that they would pay for the first month rent if she moved out.  I believe it is public housing

## 2019-12-09 NOTE — Telephone Encounter (Signed)
Email to pt      From: Jill Alexanders [facility])  Sent: Friday, Dec 09, 2019 1:37 PM To: [email or web address] Subject: Secure: Housing Resources for [facility]  Good Afternoon Ms. Malay, Thank you for speaking with me today regarding housing options for you and your family. Here is the property that we discussed and is listed on the attachment. It looks like you would qualify for the 2BR if they have availability. I have attached a few lists for you to review. Please let me know if you need anything further.  [place]                Number of Units:             16                Rents as low as: $397- 1Br; $497 - 2Br; $597 - 3Br                Unit Sizes:           1 Bedroom,2 Bedroom,3 Bedroom                Section 8 Accepted?       Yes                          Minimum Income Limits:              $13,611 (1Br); $17,040 (2Br); $20,468 (3Br)                           Maximum Income Limits:              50% of Median                   Amenities:          24 Hour Maintenance, Affordable Rent, Ample Parking, Bike rack, Quarry manager ready, Carpet/Vinyl Floors, [employer], Convenient to [employer], Astronomer, [employer] Efficient, Handicapped Accessible Units Available, Locked Mail Boxes, [place], On-Site Management Office, [place] area with grill, Playground, Range, Refrigerator, Oncologist, Hotel manager, Web-monitored security cameras                Location: [place]   [place]   [address] Management Office Location:    [place]   [place]   [address]                Phone: [phone]                   TDD Phone:                                         Emergency Phone:          [phone]                      Email:    [email or web address]    Application Fee is $6 Per Adult Applicant.    [place] . [email or web address]  [phone]   Closing referral pending any other needs of patient. KNB

## 2019-12-09 NOTE — Telephone Encounter (Signed)
  Community Resource Referral   [place] 12/09/2019  [facility]  Name: [name]   MRN: [id number]   DOB: Jun 16, 1956   AGE: 64 y.o.   GENDER: female   PCP Mullis, Kiersten P, DO.   Called pt regarding Community Resource Referral - for emergency housing resources.  Mold in home and rats. She stated that she has been in touch with [name] an Agricultural consultant with the county who stated that he would reimburse/pay her first months rent once she found another affordable housing option for her and her family to move (Daughter - Associate Professor and her 4 children ages 26 mo - 64 yr) She stated that her daughter had found a job at [employer] and is waiting to get her background check completed so she can start. Pt receives $1140 a month in disability that is the only income they have plus food stamps. 64 year old is in school at [employer] on [address].  Is searching for 2 BR home/apt for her and her family would like to pay $800 or less in rent. Found a solution in same zip code and she asked that email her the resources.  [place] . [email or web address]  [phone]

## 2019-12-15 ENCOUNTER — Other Ambulatory Visit: Payer: Self-pay | Admitting: Pharmacist

## 2020-01-09 ENCOUNTER — Ambulatory Visit (HOSPITAL_COMMUNITY)
Admission: EM | Admit: 2020-01-09 | Discharge: 2020-01-09 | Disposition: A | Discharge status: Home / Self Care | Payer: Medicare Other | Attending: Family Medicine | Admitting: Family Medicine

## 2020-01-09 ENCOUNTER — Encounter (HOSPITAL_COMMUNITY): Payer: Self-pay | Admitting: Emergency Medicine

## 2020-01-09 ENCOUNTER — Other Ambulatory Visit: Payer: Self-pay

## 2020-01-09 DIAGNOSIS — M25511 Pain in right shoulder: Secondary | ICD-10-CM | POA: Diagnosis not present

## 2020-01-09 DIAGNOSIS — S46911A Strain of unspecified muscle, fascia and tendon at shoulder and upper arm level, right arm, initial encounter: Secondary | ICD-10-CM | POA: Diagnosis not present

## 2020-01-09 DIAGNOSIS — S39012A Strain of muscle, fascia and tendon of lower back, initial encounter: Secondary | ICD-10-CM

## 2020-01-09 DIAGNOSIS — M546 Pain in thoracic spine: Secondary | ICD-10-CM | POA: Diagnosis not present

## 2020-01-09 MED ORDER — TRAMADOL HCL 50 MG PO TABS: 50.0000 mg | ORAL_TABLET | Freq: Four times a day (QID) | ORAL | 0 refills | Status: DC | PRN

## 2020-01-09 MED ORDER — CYCLOBENZAPRINE HCL 10 MG PO TABS
10.0000 mg | ORAL_TABLET | Freq: Two times a day (BID) | ORAL | 0 refills | Status: DC | PRN
Start: 2020-01-09 — End: 2020-06-21

## 2020-01-09 MED ORDER — KETOROLAC TROMETHAMINE 60 MG/2ML IM SOLN
INTRAMUSCULAR | Status: AC
  Filled 2020-01-09: qty 2

## 2020-01-09 MED ORDER — KETOROLAC TROMETHAMINE 60 MG/2ML IM SOLN
60.0000 mg | Freq: Once | INTRAMUSCULAR | Status: AC
  Administered 2020-01-09: 60 mg via INTRAMUSCULAR

## 2020-01-09 NOTE — ED Provider Notes (Signed)
[place]   [id number] 01/09/20 Arrival Time: 1341  XQ:JJHER PAIN  SUBJECTIVE: History from: patient. [name] is a 64 y.o. female complains of right neck, shoulder, and back pain that began 2 days ago. Denies a precipitating event or specific injury. Describes the pain as constant and achy in character. Has tried OTC medications without relief. Symptoms are made worse with activity. Denies similar symptoms in the past. Denies fever, chills, erythema, ecchymosis, effusion, weakness, numbness and tingling, saddle paresthesias, loss of bowel or bladder function.      ROS: As per HPI.  All other pertinent ROS negative.     Past Medical History:  Diagnosis Date  . Atrial fibrillation ([place])   . 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 Diabetes mellitus    type 2  . Diabetes mellitus out of control [facility]) 06/12/2009   [facility] [place] [facility] 02/13/14 - Early cataracts OD, no diabetic retinopathy, f/u in 12 months; Dr. Claudean Kinds.   . Hypercholesteremia   . Hypertension   . Hypertension    Hypertensive urgency 05/2006  . TIA (transient ischemic attack) 2017   Past Surgical History:  Procedure Laterality Date  . ARTERY BIOPSY Right 09/18/2017   Procedure: BIOPSY OF RIGHT TEMPORAL ARTERY;  Surgeon: Coralie Keens, MD;  Location: [facility];  Service: General;  Laterality: Right;  . DILATION AND CURETTAGE OF UTERUS  1976  . MENISCUS REPAIR  03/09   right knee  . TONSILECTOMY, ADENOIDECTOMY, BILATERAL MYRINGOTOMY AND TUBES  age 33  . TONSILLECTOMY     Allergies  Allergen Reactions  . Propoxyphene N-Acetaminophen Hives, Itching, Swelling and Other (See Comments)    REACTION: Hallucinations  . Glipizide Nausea And Vomiting  . Metformin And Related Diarrhea   No current facility-administered medications on file prior to encounter.   Current Outpatient Medications on File Prior to Encounter  Medication Sig Dispense Refill  . amLODipine  (NORVASC) 10 MG tablet Take 1 tablet (10 mg total) by mouth daily. 90 tablet 2  . atorvastatin (LIPITOR) 80 MG tablet Take 1 tablet (80 mg total) by mouth daily. 90 tablet 3  . Blood Glucose Monitoring Suppl (ONETOUCH VERIO) w/Device KIT Check blood sugar 3 times per day. 1 kit 0  . Continuous Blood Gluc Receiver (DEXCOM G6 RECEIVER) DEVI 1 kit by Does not apply route as directed. 1 each 2  . Continuous Blood Gluc Sensor (DEXCOM G6 SENSOR) MISC Inject 3 Devices into the skin as directed. 3 each 11  . Continuous Blood Gluc Transmit (DEXCOM G6 TRANSMITTER) MISC Inject 1 Device into the skin as directed. 1 each 3  . empagliflozin (JARDIANCE) 25 MG TABS tablet Take 25 mg by mouth daily. 90 tablet 3  . gabapentin (NEURONTIN) 300 MG capsule Take 2 capsules (600 mg total) by mouth 3 (three) times daily. As needed for back pain 90 capsule 3  . glucose blood (ONETOUCH VERIO) test strip Check blood sugar 3 times daily 100 each 12  . indapamide (LOZOL) 2.5 MG tablet Take 1 tablet (2.5 mg total) by mouth daily. 90 tablet 3  . insulin aspart (NOVOLOG FLEXPEN) 100 UNIT/ML FlexPen Inject 35 Units into the skin 2 (two) times daily before a meal. 15 mL   . insulin glargine (LANTUS SOLOSTAR) 100 UNIT/ML Solostar Pen Inject 80 Units into the skin 2 (two) times daily. Take 40 units every morning and take 38 units every evening 30 mL 3  . liraglutide (VICTOZA) 18 MG/3ML SOPN  Inject 0.3 mLs (1.8 mg total) into the skin daily before breakfast.    . lisinopril (ZESTRIL) 40 MG tablet TAKE 1 TABLET(40 MG) BY MOUTH DAILY 90 tablet 2  . nystatin (MYCOSTATIN/NYSTOP) powder Apply topically 4 (four) times daily as needed. (Patient taking differently: Apply topically 2 (two) times daily. ) 60 g 5  . OneTouch Delica Lancets 49P MISC Check blood sugar 3 times daily 100 each 11  . rivaroxaban (XARELTO) 20 MG TABS tablet Take 1 tablet (20 mg total) by mouth daily with supper. 90 tablet 3  . [DISCONTINUED] amLODipine (NORVASC) 5 MG  tablet Take 1 tablet (5 mg total) by mouth daily. 90 tablet 1   Social History   Socioeconomic History  . Marital status: Divorced    Spouse name: Not on file  . Number of children: 3  . Years of education: 58  . Highest education level: High school graduate  Occupational History  . Occupation: Retired    Comment: Med Tech   Tobacco Use  . Smoking status: Never Smoker  . Smokeless tobacco: Never Used  Substance and Sexual Activity  . Alcohol use: No  . Drug use: No  . Sexual activity: Not Currently  Other Topics Concern  . Not on file  Social History Narrative   Patient lives in [place] with her daughter and grandchildren.   Patient does not drive, daughter takes her to apts and shopping.    Patient enjoys playing mind games on her phone and watching TV.    Social Determinants of Health   Financial Resource Strain: Low Risk   . Difficulty of Paying Living Expenses: Not very hard  Food Insecurity: Food Insecurity Present  . Worried About Charity fundraiser in the Last Year: Sometimes true  . Ran Out of Food in the Last Year: Sometimes true  Transportation Needs: No Transportation Needs  . Lack of Transportation (Medical): No  . Lack of Transportation (Non-Medical): No  Physical Activity: Unknown  . Days of Exercise per Week: Not on file  . Minutes of Exercise per Session: 0 min  Stress:   . Feeling of Stress :   Social Connections: Unknown  . Frequency of Communication with Friends and Family: Not on file  . Frequency of Social Gatherings with Friends and Family: Not on file  . Attends Religious Services: More than 4 times per year  . Active Member of Clubs or Organizations: Yes  . Attends Archivist Meetings: More than 4 times per year  . Marital Status: Divorced  Human resources officer Violence: Not At Risk  . Fear of Current or Ex-Partner: No  . Emotionally Abused: No  . Physically Abused: No  . Sexually Abused: No   Family History  Problem Relation  Age of Onset  . Heart failure Mother   . Diabetes Father   . Hypertension Father   . Cancer Father   . Lung cancer Sister   . Stroke Brother   . Diabetes Brother   . Stroke Maternal Grandmother   . Heart disease Maternal Grandfather     OBJECTIVE:  Vitals:   01/09/20 1406  BP: (!) 195/81  Pulse: 94  Resp: 18  Temp: 98.4 F (36.9 C)  TempSrc: Oral  SpO2: 99%    General appearance: ALERT; in no acute distress.  Head: NCAT Lungs: Normal respiratory effort CV:  pulses 2+ bilaterally. Cap refill < 2 seconds Musculoskeletal:  Inspection: Skin warm, dry, clear and intact without obvious erythema, effusion, or ecchymosis.  Palpation: tender to palpation and muscle in spasm at R trapezius ROM:  Limited ROM active and passive Skin: warm and dry Neurologic: Ambulates without difficulty; Sensation intact about the upper/ lower extremities Psychological: alert and cooperative; normal mood and affect  DIAGNOSTIC STUDIES:  No results found.   ASSESSMENT & PLAN:  1. Strain of right shoulder, initial encounter   2. Acute pain of right shoulder   3. Acute right-sided thoracic back pain   4. Back strain, initial encounter     Meds ordered this encounter  Medications  . ketorolac (TORADOL) injection 60 mg  . cyclobenzaprine (FLEXERIL) 10 MG tablet    Sig: Take 1 tablet (10 mg total) by mouth 2 (two) times daily as needed for muscle spasms.    Dispense:  20 tablet    Refill:  0    Order Specific Question:   Supervising Provider    Answer:   Chase Picket [id number]  . traMADol (ULTRAM) 50 MG tablet    Sig: Take 1 tablet (50 mg total) by mouth every 6 (six) hours as needed.    Dispense:  15 tablet    Refill:  0    Order Specific Question:   Supervising Provider    Answer:   Chase Picket [id number]   Neck Pain Neck Strain Back Pain Back Strain Toradol 44m IM in office today Prescribed Tramadol Prescribed Flexeril Continue conservative management of rest, ice,  and gentle stretches Take tylenol as needed for pain relief (may cause abdominal discomfort, ulcers, and GI bleeds avoid taking with other NSAIDs) Take cyclobenzaprine at nighttime for symptomatic relief. Avoid driving or operating heavy machinery while using medication. Follow up with PCP if symptoms persist Return or go to the ER if you have any new or worsening symptoms (fever, chills, chest pain, abdominal pain, changes in bowel or bladder habits, pain radiating into lower legs)   Reviewed expectations re: course of current medical issues. Questions answered. Outlined signs and symptoms indicating need for more acute intervention. Patient verbalized understanding. After Visit Summary given.       MFaustino Congress NP 01/09/20 1452

## 2020-01-09 NOTE — ED Triage Notes (Signed)
Pt sts right shoulder and back pain x 4 days; denies obvious injury

## 2020-01-09 NOTE — Discharge Instructions (Addendum)
Take tylenol as needed for your pain. Take the muscle relaxer Flexeril as needed for muscle spasm; do not drive, operate machinery, or drink alcohol with this medication as it may make you drowsy.    Follow up with an orthopedist if your pain is not improving.  Go to the emergency department if you have worsening pain or develop new symptoms such as difficulty with urination, weakness, numbness, loss of control of your bladder or bowels, fever, chills or other concerns.

## 2020-01-17 ENCOUNTER — Telehealth: Payer: Self-pay | Admitting: *Deleted

## 2020-01-17 NOTE — Telephone Encounter (Signed)
-----   Message from Danna Hefty, DO sent at 01/16/2020  1:44 PM EDT ----- Regarding: [place],  Would you mind rescheduling this patient from 6/25 to the 3pm slot on 6/24 ([facility])? She is aware of the change and amendable to it. Would you mind letting her know it is completed and remind her to bring her medications with her to the appointment.  Thank you!!

## 2020-01-17 NOTE — Telephone Encounter (Signed)
Pt scheduled. Per Janett Billow im just supposed to schedule, the doctor is to speak with them directly about appt. Tracie Dore Kennon Holter, CMA

## 2020-01-17 NOTE — Telephone Encounter (Signed)
Yes I already spoke to patient about the appointment. I just would like someone to remind her of her appointment date and to bring her medications with her.

## 2020-01-20 NOTE — Telephone Encounter (Signed)
Pt informed. [name], CMA  

## 2020-01-26 ENCOUNTER — Ambulatory Visit (INDEPENDENT_AMBULATORY_CARE_PROVIDER_SITE_OTHER): Payer: Medicare Other | Admitting: Family Medicine

## 2020-01-26 ENCOUNTER — Other Ambulatory Visit: Payer: Self-pay

## 2020-01-26 ENCOUNTER — Encounter: Payer: Self-pay | Admitting: Family Medicine

## 2020-01-26 VITALS — BP 120/90 | HR 104 | Wt 230.0 lb

## 2020-01-26 DIAGNOSIS — E785 Hyperlipidemia, unspecified: Secondary | ICD-10-CM

## 2020-01-26 DIAGNOSIS — E118 Type 2 diabetes mellitus with unspecified complications: Secondary | ICD-10-CM | POA: Diagnosis not present

## 2020-01-26 DIAGNOSIS — Z794 Long term (current) use of insulin: Secondary | ICD-10-CM | POA: Diagnosis not present

## 2020-01-26 DIAGNOSIS — F32A Depression, unspecified: Secondary | ICD-10-CM

## 2020-01-26 DIAGNOSIS — F329 Major depressive disorder, single episode, unspecified: Secondary | ICD-10-CM

## 2020-01-26 DIAGNOSIS — Z5900 Homelessness unspecified: Secondary | ICD-10-CM

## 2020-01-26 DIAGNOSIS — I10 Essential (primary) hypertension: Secondary | ICD-10-CM

## 2020-01-26 DIAGNOSIS — G5702 Lesion of sciatic nerve, left lower limb: Secondary | ICD-10-CM | POA: Diagnosis not present

## 2020-01-26 DIAGNOSIS — E1165 Type 2 diabetes mellitus with hyperglycemia: Secondary | ICD-10-CM

## 2020-01-26 DIAGNOSIS — E1169 Type 2 diabetes mellitus with other specified complication: Secondary | ICD-10-CM

## 2020-01-26 DIAGNOSIS — M5432 Sciatica, left side: Secondary | ICD-10-CM

## 2020-01-26 DIAGNOSIS — E2839 Other primary ovarian failure: Secondary | ICD-10-CM

## 2020-01-26 DIAGNOSIS — IMO0002 Reserved for concepts with insufficient information to code with codable children: Secondary | ICD-10-CM

## 2020-01-26 LAB — POCT GLYCOSYLATED HEMOGLOBIN (HGB A1C): HbA1c, POC (controlled diabetic range): 9.6 % — AB (ref 0.0–7.0)

## 2020-01-26 MED ORDER — ONETOUCH DELICA LANCETS 33G MISC: 11 refills | Status: DC

## 2020-01-26 MED ORDER — ONETOUCH VERIO W/DEVICE KIT: PACK | 0 refills | Status: DC

## 2020-01-26 MED ORDER — ONETOUCH VERIO VI STRP: ORAL_STRIP | 12 refills | Status: DC

## 2020-01-26 MED ORDER — VICTOZA 18 MG/3ML ~~LOC~~ SOPN: 1.8000 mg | PEN_INJECTOR | Freq: Every day | SUBCUTANEOUS | Status: DC

## 2020-01-26 MED ORDER — TRAMADOL HCL 50 MG PO TABS: 50.0000 mg | ORAL_TABLET | Freq: Four times a day (QID) | ORAL | 0 refills | Status: AC | PRN

## 2020-01-26 MED ORDER — EMPAGLIFLOZIN 25 MG PO TABS: 25.0000 mg | ORAL_TABLET | Freq: Every day | ORAL | 3 refills | Status: DC

## 2020-01-26 NOTE — Progress Notes (Signed)
Subjective:   Patient ID: [name]    DOB: March 07, 1956, 64 y.o. female   MRN: [id number]  [name] is a 64 y.o. female with a history of a.fib, HTN, mild OSA, diabetic neuropathy, HLD< uncontrolled DM, CKD II, depression here for DM followup.   Uncontrolled Diabetes: Last three A1C's below. Currently on Victoza 1.25m daily, Lantus 65 QD, Jardiance 256m Novolog 35U BID (although notes she only takes the NovoLog in the morning). Endorses compliance. She has been out of Dexcom supplies since the 16th. Endorses occasional hypoglycemic symptoms. She does not check her blood sugars, she just eats something to feel better.  This occurs 1-2 times per week.  Endorses polyuria, polydipsia, polyphagia. Due for PNA vaccine. Has lost 13lbs since March.   She has requested new Dexcom supplies but this has not arrived yet.  Lab Results  Component Value Date   HGBA1C 9.6 (A) 01/26/2020   HGBA1C 9.6 (A) 10/21/2019   HGBA1C 13.7 (A) 07/11/2019   HTN:  BP: 120/90 today. Currently on Amlodipine 1049mD, Indapamide 2.5mg68m and Lisinopril 40mg79m Endorses compliance. Denies any chest pain, SOB, vision changes, or headaches. Goal <130/80.  HLD: Last lipid panel below. Currently on Atorvastatin 80mg 54mEndorses compliance. Denies any muscles aches or weakness.  Lab Results  Component Value Date   CHOL 189 07/11/2019   HDL 46 07/11/2019   LDLCALC 118 (H) 07/11/2019   LDLDIRECT 51 03/27/2016   TRIG 139 07/11/2019   CHOLHDL 4.1 07/11/2019   Homelessness  Depression: Patient notes that she is currently homeless and many of her diabetes supplies and medications are in storage.  She notes that she is a family of 6 (4 k108ds, 2 adults) looking for home which is made it very hard and has prolonged her homelessness.  She has limited income as she is on disability and her daughter is the only other source of income.  She does have her church friends to go to caring this hard time.  She notes many days  she feels so overwhelmed and discouraged that she feels it would be easier to just quit. She continues to take her medications because she knows she needs to be there with her family, however her health is the lowest of her priority right now. Denies SI/HI, history of self harm, or access to weapons.  Notes that her daughter helps her keep up with her medications and provides a constant reminder to take them daily.  Low Back Pain with Radiculopathy: Started mid June shortly after her left shoulder pain had improved (around 01/09/20). Notes she woke up one morning with the pain.  Describes the pain as Constant and sharp.  She notes that the only thing that improves her pain is hot showers.  Denies saddle anesthesia, fecal or urinary incontinence.   Health Maintenance: Due for COVID-19 vaccine, TDAP, and dexa-scan  Review of Systems:  Per HPI.  Objective:   BP 120/90   Pulse (!) 104   Wt 230 lb (104.3 kg)   SpO2 99%   BMI 42.07 kg/m  Vitals and nursing note reviewed.  General: pleasant obese AA female, appears uncomfortable at times, well nourished, well developed, in no acute distress with non-toxic appearance, occasionally tearful during exam CV: regular rate and rhythm without murmurs, rubs, or gallops Lungs: clear to auscultation bilaterally with normal work of breathing Skin: warm, dry Extremities: warm and well perfused MSK: see below Neuro: Alert and oriented, speech normal  Left Hip:  -  Inspection: No gross deformity, no swelling, erythema, or ecchymosis - Palpation: TTP at piriformis, nontender along lumbar spinous processes or paraspinal muscles, nontender at greater trochanter - ROM: Normal range of motion - Strength: Normal strength. - Neuro/vasc: NV intact distally - Special Tests: Positive FABER on left   Depression screen [name] 2/9 01/26/2020 11/29/2019 11/29/2019  Decreased Interest 3 3 0  Down, Depressed, Hopeless 3 2 0  PHQ - 2 Score 6 5 0  Altered sleeping 3 - -    Tired, decreased energy 3 - -  Change in appetite 3 - -  Feeling bad or failure about yourself  1 - -  Trouble concentrating 1 - -  Moving slowly or fidgety/restless 3 - -  Suicidal thoughts 0 - -  PHQ-9 Score 20 - -  Difficult doing work/chores Somewhat difficult - -  Some recent data might be hidden   Assessment & Plan:   Essential hypertension Chronic, Well controlled. Continue current medications.   Hyperlipidemia associated with type 2 diabetes mellitus Continue atorvastatin 80 mg daily.  We will plan to obtain lipid panel at follow-up visit in December 2021.  Uncontrolled diabetes mellitus with complication, with long-term current use of insulin (HCC) Stable, but continues to be uncontrolled in setting of homelessness and significant financial instability.  Patient has not been checking blood sugars regularly.   - Refill of all Diabetic supplies sent to pharmacy.   - Patient instructed to check blood sugars 3 times a day and keep a log - Check blood sugar if develops hypoglycemic symptoms and log  - Continue current medications as prescribed: Victoza 1.84m daily, Lantus 65 QD, Jardiance 272m Novolog 35U BID - Follow up in 1 month to review log and adjust medication  Depression Severe depression in setting of homelessness and significant financial burdens. This is clearly impacting her medical conditions as well.  Patient does deny SI/HI and would turn to her church members if a crisis occurs.  CCM consulted for resources and support.  Can consider starting antidepressant in the future if symptoms persist despite improvement in social difficulties. Safety plan discussed with the patient. They are aware of how to contact crisis services if need be and instructed to go to the nearest emergency room if they feel they are in imminent danger of harming themselves and or others, or symptoms are of out control or unbearable.  Follow-up 1 month, sooner if worsening symptoms  Piriformis  syndrome of left side History and physical exam appear most consistent with left-sided piriformis syndrome with sciatica.  Education and reassurance provided.  Recommended conservative management at this time. -Schedule Tylenol every 6 hours with tramadol 50 mg as needed for breakthrough pain -Heating pad when able -Activity as tolerated - over-the-counter lidocaine patches - Can continue muscle relaxer as needed.  Patient counseled to avoid driving a vehicle or operating heavy machinery when taking this medication -Follow-up in 4 to 6 weeks if no improvement, will sooner if worsening -Handout provided with information and exercises  Healthcare Maintenance: Plan to further discuss at follow up visit in 1 month  Orders Placed This Encounter  Procedures  . DG Bone Density    Standing Status:   Future    Standing Expiration Date:   01/25/2021    Order Specific Question:   Reason for Exam (SYMPTOM  OR DIAGNOSIS REQUIRED)    Answer:   chronic estrogen deficiency    Order Specific Question:   Preferred imaging location?    Answer:  [facility]  . LDL Cholesterol, Direct  . Ambulatory referral to Chronic Care Management Services    Referral Priority:   Routine    Referral Type:   Consultation    Referral Reason:   Care Coordination    Number of Visits Requested:   1  . HgB A1c   Meds ordered this encounter  Medications  . Blood Glucose Monitoring Suppl (ONETOUCH VERIO) w/Device KIT    Sig: Check blood sugar 3 times per day.    Dispense:  1 kit    Refill:  0  . glucose blood (ONETOUCH VERIO) test strip    Sig: Check blood sugar 3 times daily    Dispense:  100 each    Refill:  12  . OneTouch Delica Lancets 44Q MISC    Sig: Check blood sugar 3 times daily    Dispense:  100 each    Refill:  11  . traMADol (ULTRAM) 50 MG tablet    Sig: Take 1 tablet (50 mg total) by mouth every 6 (six) hours as needed for up to 7 days.    Dispense:  28 tablet    Refill:  0  . liraglutide  (VICTOZA) 18 MG/3ML SOPN    Sig: Inject 0.3 mLs (1.8 mg total) into the skin daily before breakfast.  . empagliflozin (JARDIANCE) 25 MG TABS tablet    Sig: Take 1 tablet (25 mg total) by mouth daily.    Dispense:  90 tablet    Refill:  [place], DO PGY-2, [facility] 01/29/2020 9:05 PM

## 2020-01-26 NOTE — Patient Instructions (Addendum)
I am so sorry that you are going through such a hard time right now.  I have consulted our social worker for further assistance in hopes they will be able to provide you with more resources to help you in this trying time.  For your leg pain, take Tylenol scheduled every 6 hours and the tramadol as needed for breakthrough pain.  Use a heating pad when able.  You may also try over-the-counter lidocaine patches to the area in hopes to improve the pain.  It will take several more weeks for this to likely resolve completely.  You may continue to take the muscle relaxers as needed.  Be sure to avoid driving a vehicle when taking this medication.  I have sent in more supplies for your diabetes.  Please call and inform the nurse that you have been able to obtain the supplies.  Please try to check your blood sugar 3 times a day (before bed, first thing in the morning, and one time during the day).  Any time you have a feeling of low blood sugar please check your blood sugar at that time and document and then be sure to eat something to help improve the symptoms.  I scheduled you a follow-up for 1 month via telemedicine.  Please call me sooner if you need any further assistance.  Below are some resources as well as the emergency hotline number in case you develop severe symptoms.  Take care, Dr. Tarry Kos  Housing Resources: [employer]:   [email or web address]  [phone] [address], [place], [place] [address]   . [place] Housing Medical sales [email or web address]   [phone]  . [place]:  [phone]     [place], [place], [place] [address]    [place]:  [phone]     [place], [place], [place] [address]    . Affordable Housing Management:  [phone]  [email or web address]  [address] [place] [address] [place], [place] [address]  . [employer]: [place] By appointment only - Call  [phone]   . Homeless: Museum/gallery curator [facility]) [phone]   [address]     If you are feeling suicidal or depression symptoms worsen please immediately go to:   24 Hour Availability [facility]  [address] [place], [place] [address]  [phone] or [phone]  . If you are thinking about harming yourself or having thoughts of suicide, or if you know someone who is, seek help right away. . Call your doctor or mental health care provider. . Call 911 or go to a hospital emergency room to get immediate help, or ask a friend or family member to help you do these things. . Call the [place] National Suicide Prevention Lifeline's toll-free, 24-hour hotline at 1-800-273-TALK 2368739226) or TTY: 1-800-799-4 TTY 360-371-5614) to talk to a trained counselor. . If you are in crisis, make sure you are not left alone.  . If someone else is in crisis, make sure he or she is not left alone   Family Service of the [employer] (Domestic Violence, Rape & Victim Assistance [phone]  [facility]  [address],   [address]               [phone] or [phone]  [facility]    (ONLY from 8am-4pm)    [phone]  [facility] Mobile Crisis Unit (24/7)  [phone]  [place] National Suicide Hotline   ([phone] Diamantina Monks)

## 2020-01-27 ENCOUNTER — Ambulatory Visit: Payer: Medicare Other | Admitting: Family Medicine

## 2020-01-29 DIAGNOSIS — G5702 Lesion of sciatic nerve, left lower limb: Secondary | ICD-10-CM | POA: Insufficient documentation

## 2020-01-29 NOTE — Assessment & Plan Note (Signed)
Continue atorvastatin 80 mg daily.  We will plan to obtain lipid panel at follow-up visit in December 2021.

## 2020-01-29 NOTE — Assessment & Plan Note (Signed)
>>  ASSESSMENT AND PLAN FOR UNCONTROLLED DIABETES MELLITUS WITH COMPLICATION, WITH LONG-TERM CURRENT USE OF INSULIN WRITTEN ON 01/29/2020  8:54 PM BY MULLIS, KIERSTEN P, DO  Stable, but continues to be uncontrolled in setting of homelessness and significant financial instability.  Patient has not been checking blood sugars regularly.   - Refill of all Diabetic supplies sent to pharmacy.   - Patient instructed to check blood sugars 3 times a day and keep a log - Check blood sugar if develops hypoglycemic symptoms and log  - Continue current medications as prescribed: Victoza 1.8mg  daily, Lantus 65 QD, Jardiance 25mg , Novolog 35U BID - Follow up in 1 month to review log and adjust medication

## 2020-01-29 NOTE — Assessment & Plan Note (Signed)
Chronic, Well controlled. Continue current medications.

## 2020-01-29 NOTE — Assessment & Plan Note (Signed)
History and physical exam appear most consistent with left-sided piriformis syndrome with sciatica.  Education and reassurance provided.  Recommended conservative management at this time. -Schedule Tylenol every 6 hours with tramadol 50 mg as needed for breakthrough pain -Heating pad when able -Activity as tolerated - over-the-counter lidocaine patches - Can continue muscle relaxer as needed.  Patient counseled to avoid driving a vehicle or operating heavy machinery when taking this medication -Follow-up in 4 to 6 weeks if no improvement, will sooner if worsening -Handout provided with information and exercises

## 2020-01-29 NOTE — Assessment & Plan Note (Signed)
Severe depression in setting of homelessness and significant financial burdens. This is clearly impacting her medical conditions as well.  Patient does deny SI/HI and would turn to her church members if a crisis occurs.  CCM consulted for resources and support.  Can consider starting antidepressant in the future if symptoms persist despite improvement in social difficulties. Safety plan discussed with the patient. They are aware of how to contact crisis services if need be and instructed to go to the nearest emergency room if they feel they are in imminent danger of harming themselves and or others, or symptoms are of out control or unbearable.  Follow-up 1 month, sooner if worsening symptoms

## 2020-01-29 NOTE — Assessment & Plan Note (Addendum)
Stable, but continues to be uncontrolled in setting of homelessness and significant financial instability.  Patient has not been checking blood sugars regularly.   - Refill of all Diabetic supplies sent to pharmacy.   - Patient instructed to check blood sugars 3 times a day and keep a log - Check blood sugar if develops hypoglycemic symptoms and log  - Continue current medications as prescribed: Victoza 1.8mg  daily, Lantus 65 QD, Jardiance 25mg , Novolog 35U BID - Follow up in 1 month to review log and adjust medication

## 2020-02-01 ENCOUNTER — Telehealth: Payer: Self-pay | Admitting: Family Medicine

## 2020-02-01 NOTE — Chronic Care Management (AMB) (Signed)
  Chronic Care Management   Note  02/01/2020 Name: [name] MRN: [id number] DOB: Nov 22, 1955  [name] is a 64 y.o. year old female who is a primary care patient of Danna Hefty, DO. I reached out to Thornell Sartorius by phone today in response to a referral sent by [name] PCP, Mullis, Kiersten P, DO.     [name] was given information about Chronic Care Management services today including:  1. CCM service includes personalized support from designated clinical staff supervised by her physician, including individualized plan of care and coordination with other care providers 2. 24/7 contact phone numbers for assistance for urgent and routine care needs. 3. Service will only be billed when office clinical staff spend 20 minutes or more in a month to coordinate care. 4. Only one practitioner may furnish and bill the service in a calendar month. 5. The patient may stop CCM services at any time (effective at the end of the month) by phone call to the office staff. 6. The patient will be responsible for cost sharing (co-pay) of up to 20% of the service fee (after annual deductible is met).  Patient agreed to services and verbal consent obtained.   Follow up plan: Telephone appointment with LCSW care management team member scheduled for: 02/03/2020  Telephone appointment with RN CM care management team member scheduled for: 02/08/2020  Made patient aware care guide from community resources will be calling.   [place], [place] [address] Direct Dial: [phone] [email or web address] Website: [email or web address]

## 2020-02-03 ENCOUNTER — Other Ambulatory Visit: Payer: Self-pay

## 2020-02-03 ENCOUNTER — Ambulatory Visit: Payer: Self-pay | Admitting: Licensed Clinical Social Worker

## 2020-02-03 ENCOUNTER — Ambulatory Visit: Payer: Medicare Other | Admitting: Licensed Clinical Social Worker

## 2020-02-03 DIAGNOSIS — Z139 Encounter for screening, unspecified: Secondary | ICD-10-CM

## 2020-02-03 DIAGNOSIS — F439 Reaction to severe stress, unspecified: Secondary | ICD-10-CM

## 2020-02-03 NOTE — Chronic Care Management (AMB) (Signed)
Clinical Social Work  Care Management referral   02/03/2020 Name: [name] MRN: [id number] DOB: 03-25-1956  [name] is a 64 y.o. year old female who is a primary care patient of Danna Hefty, DO . LCSW was consulted by PCP for assistance with [employer]  and [employer] [employer].  LCSW reached out to Thornell Sartorius today by phone to introduce self, assess needs and offer Care Management services and interventions.    Assessment: Patient continues to experience times of feeling down which seem to be exacerbated by her housing situation.  Patient reports not interested in counseling at this time. Patient is making progress towards finding new housing and will get assistance from [place] for deposit and first month's rent.  States no further assistance is needed at this time.  Patient's only concern is pain in her back.  Intervention: Patient agreed to services provided today, however does not require or desire additional follow up. Assessment of needs and barriers completed;  . Provided patient with EMMI educational video for diaphragm breathing and back pain . Collaborated with CCM RN  re: patient's concerns and reminded patient to expect a call from [facility] RN. . Other interventions include:Solution-Focused Strategies and emotional support. Plan:  1. No F/U scheduled for LCSW 2.  Patient has phone appointment with CCM RN July 7th   Advance Directive Status: not addressed during this encounter.  SDOH (Social Determinants of Health) assessments performed: ; No new needs identified    Outpatient Encounter Medications as of 02/03/2020  Medication Sig Note  . amLODipine (NORVASC) 10 MG tablet Take 1 tablet (10 mg total) by mouth daily.   Marland Kitchen atorvastatin (LIPITOR) 80 MG tablet Take 1 tablet (80 mg total) by mouth daily.   . Blood Glucose Monitoring Suppl (ONETOUCH VERIO) w/Device KIT Check blood sugar 3 times per day.   . Continuous  Blood Gluc Receiver (DEXCOM G6 RECEIVER) DEVI 1 kit by Does not apply route as directed.   . Continuous Blood Gluc Sensor (DEXCOM G6 SENSOR) MISC Inject 3 Devices into the skin as directed.   . Continuous Blood Gluc Transmit (DEXCOM G6 TRANSMITTER) MISC Inject 1 Device into the skin as directed.   . cyclobenzaprine (FLEXERIL) 10 MG tablet Take 1 tablet (10 mg total) by mouth 2 (two) times daily as needed for muscle spasms.   . empagliflozin (JARDIANCE) 25 MG TABS tablet Take 1 tablet (25 mg total) by mouth daily.   Marland Kitchen gabapentin (NEURONTIN) 300 MG capsule Take 2 capsules (600 mg total) by mouth 3 (three) times daily. As needed for back pain   . glucose blood (ONETOUCH VERIO) test strip Check blood sugar 3 times daily   . indapamide (LOZOL) 2.5 MG tablet Take 1 tablet (2.5 mg total) by mouth daily.   . insulin aspart (NOVOLOG FLEXPEN) 100 UNIT/ML FlexPen Inject 35 Units into the skin 2 (two) times daily before a meal.   . insulin glargine (LANTUS SOLOSTAR) 100 UNIT/ML Solostar Pen Inject 80 Units into the skin 2 (two) times daily. Take 40 units every morning and take 38 units every evening 11/28/2019: Take 63-65 units once daily, patient is unsure of exact dose  . liraglutide (VICTOZA) 18 MG/3ML SOPN Inject 0.3 mLs (1.8 mg total) into the skin daily before breakfast.   . lisinopril (ZESTRIL) 40 MG tablet TAKE 1 TABLET(40 MG) BY MOUTH DAILY   . nystatin (MYCOSTATIN/NYSTOP) powder Apply topically 4 (four) times daily as needed. (Patient taking  differently: Apply topically 2 (two) times daily. )   . OneTouch Delica Lancets 91Y MISC Check blood sugar 3 times daily   . rivaroxaban (XARELTO) 20 MG TABS tablet Take 1 tablet (20 mg total) by mouth daily with supper.   . [DISCONTINUED] amLODipine (NORVASC) 5 MG tablet Take 1 tablet (5 mg total) by mouth daily.    No facility-administered encounter medications on file as of 02/03/2020.   Review of patient status, including review of consultants reports, relevant  laboratory and other test results, and collaboration with appropriate care team members and the patient's provider was performed as part of comprehensive patient evaluation and provision of care management services.    Casimer Lanius, [place] / [place]   [phone] 4:13 PM

## 2020-02-03 NOTE — Chronic Care Management (AMB) (Signed)
    Clinical Social Work  Care Management Outreach   02/03/2020 Name: [name] MRN: [id number] DOB: 1956-07-22  [name] is a 64 y.o. year old female who is a primary care patient of Danna Hefty, DO .  The Care Management team was consulted for assistance with Mental Health Counseling and Resources.  LCSW reached out to Thornell Sartorius today by phone to introduce self, assess needs and offer Care Management services and interventions. Patient requested LCSW call back within the next hour  Plan: LCSW will call later today  Review of patient status, including review of consultants reports, relevant laboratory and other test results, and collaboration with appropriate care team members and the patient's provider was performed as part of comprehensive patient evaluation and provision of care management services.    Casimer Lanius, [place] / [place]   [phone] 2:09 PM

## 2020-02-08 ENCOUNTER — Other Ambulatory Visit: Payer: Self-pay

## 2020-02-08 ENCOUNTER — Ambulatory Visit: Payer: Medicare Other

## 2020-02-08 NOTE — Chronic Care Management (AMB) (Signed)
  Care Management   Outreach Note  02/08/2020 Name: [name] MRN: [id number] DOB: [birth year]  Referred by: Danna Hefty, DO Reason for referral : Care Coordination (Care Management RNCM DM/ HTN)   An unsuccessful telephone outreach was attempted today. The patient was referred to the case management team for assistance with care management and care coordination.   Follow Up Plan: A HIPPA compliant phone message was left for the patient providing contact information and requesting a return call.  The care management team will reach out to the patient again over the next 7-14 days.   Lazaro Arms RN, BSN, [facility] Care Management Coordinator [place] Phone: [phone] Fax: [phone]

## 2020-02-17 ENCOUNTER — Other Ambulatory Visit: Payer: Self-pay

## 2020-02-17 ENCOUNTER — Ambulatory Visit: Payer: Medicare Other

## 2020-02-17 NOTE — Chronic Care Management (AMB) (Signed)
  Care Management   Outreach Note  02/17/2020 Name: [name] MRN: [id number] DOB: 05-12-1956  Referred by: Danna Hefty, DO Reason for referral : Chronic Care Management (Initial outreach 2nd attempt)   A second unsuccessful telephone outreach was attempted today. The patient was referred to the case management team for assistance with care management and care coordination.   Follow Up Plan: A HIPPA compliant phone message was left for the patient providing contact information and requesting a return call.  The care management team will reach out to the patient again over the next 7-14 days.   Lazaro Arms RN, BSN, [facility] Care Management Coordinator [place] Phone: [phone] Fax: [phone]

## 2020-02-22 ENCOUNTER — Telehealth: Payer: Medicare Other | Admitting: Family Medicine

## 2020-02-23 ENCOUNTER — Other Ambulatory Visit: Payer: Self-pay

## 2020-02-23 ENCOUNTER — Ambulatory Visit: Payer: Medicare Other

## 2020-02-23 NOTE — Chronic Care Management (AMB) (Signed)
  Care Management   Outreach Note  02/23/2020 Name: [name] MRN: [id number] DOB: 09/25/1955  Referred by: Danna Hefty, DO Reason for referral : Chronic Care Management (Initial )   Third unsuccessful telephone outreach was attempted today. The patient was referred to the case management team for assistance with care management and care coordination. The patient's primary care provider has been notified of our unsuccessful attempts to make or maintain contact with the patient. The care management team is pleased to engage with this patient at any time in the future should he/she be interested in assistance from the care management team.   Follow Up Plan: The care management team is available to follow up with the patient after provider conversation with the patient regarding recommendation for care management engagement and subsequent re-referral to the care management team.   Lazaro Arms RN, BSN, [place] Management Coordinator [place] Phone: [phone] Fax: [phone]

## 2020-03-27 ENCOUNTER — Telehealth: Payer: Self-pay | Admitting: Family Medicine

## 2020-03-27 NOTE — Telephone Encounter (Signed)
° °  SF 03/27/2020    Name: [name]    MRN: [id number]    DOB: [birth year]    AGE: 64 y.o.    GENDER: female    PCP Mullis, Kiersten P, DO.   Called pt regarding Community Resource Referral for housing and diabetic supplies. Left message for patient to give Care Guide a call back.  Per note from LCSW on 02/03/20 the patient has been working with [employer] to assist with deposit and first months rent toward a place to stay. Will follow up with patient to see if she still has any additional needs.   Follow up on: 03/28/20  [place], Care Management Phone: [phone] Email: [email or web address]

## 2020-03-28 NOTE — Telephone Encounter (Signed)
Spoke with patient regarding referral. Patient stated that she is still in need of housing, but asked if Care Guide can give her a call on 03/29/20. Care Guide will follow up with patient.

## 2020-03-29 NOTE — Telephone Encounter (Signed)
° °  SF 03/29/2020    Name: [name]    MRN: [id number]    DOB: [birth year]    AGE: 64 y.o.    GENDER: female    PCP Mullis, Kiersten P, DO.   Called pt regarding [employer] Referral for housing. Patient did not answer. Left message for patient to return call to Care Guide.   Follow up on: 03/30/2020  [place], Care Management Phone: [phone] Email: [email or web address]

## 2020-04-02 ENCOUNTER — Encounter: Payer: Self-pay | Admitting: Family Medicine

## 2020-04-02 LAB — HM DIABETES EYE EXAM

## 2020-04-02 NOTE — Telephone Encounter (Signed)
Spoke with patient regarding referral. [name] stated that the most emergent need is housing. Patient stated that the old house she used to live in was condemned and that she will be receiving assistance with a $750 deposit and $750 first months rent toward a new place for her and her family Currently she is living with family, but it is becoming harder because they need a space to live.  Patient stated that she has worked with [employer], but has not come across a property that is available to her and her family. She stated that currently [employer] is not taking applications at this time. She also researched [employer] website and the property she went to look at still had a family living in it. Informed patient that Care Guide can send a list of income-based housing within the [place] area and that if she does not receive the letter to give the office a call and it can be re-sent to her. Patient stated understanding. [name] also stated that she will try to working with an organization called [employer] in [place] and the fee is $100. The organization can help individuals find housing. Informed [name] that [employer] could be a good resource for her. Patient stated no additional needs at this time.   Closing referral pending any other needs of patient.

## 2020-04-11 ENCOUNTER — Other Ambulatory Visit: Payer: Self-pay

## 2020-04-11 ENCOUNTER — Ambulatory Visit (HOSPITAL_COMMUNITY)
Admission: EM | Admit: 2020-04-11 | Discharge: 2020-04-11 | Disposition: A | Discharge status: Home / Self Care | Payer: Medicare Other | Attending: Family Medicine | Admitting: Family Medicine

## 2020-04-11 ENCOUNTER — Encounter (HOSPITAL_COMMUNITY): Payer: Self-pay

## 2020-04-11 DIAGNOSIS — M62838 Other muscle spasm: Secondary | ICD-10-CM

## 2020-04-11 MED ORDER — CYCLOBENZAPRINE HCL 5 MG PO TABS: 5.0000 mg | ORAL_TABLET | Freq: Three times a day (TID) | ORAL | 0 refills | Status: DC | PRN

## 2020-04-11 MED ORDER — DICLOFENAC SODIUM 1 % EX GEL: 2.0000 g | Freq: Four times a day (QID) | CUTANEOUS | 0 refills | Status: DC

## 2020-04-11 NOTE — Discharge Instructions (Signed)
Take over the counter tylenol and ibuprofen as needed, can also use heat, massage, voltaren gel to areas of pain, and flexeril (muscle relaxer) as needed. The muscle relaxer can make you feel groggy so use caution and avoid other sedating medications, alcohol, and driving while taking.

## 2020-04-11 NOTE — ED Triage Notes (Signed)
Pt presents with bilateral shoulder pain and back pain X 3 days none injury related.

## 2020-04-11 NOTE — ED Provider Notes (Signed)
[name]    CSN: [id number] Arrival date & time: 04/11/20  4098      History   Chief Complaint Chief Complaint  Patient presents with  . Shoulder Pain  . Back Pain    HPI MAELYN BERREY is a 64 y.o. female.   Started suddenly at church 4 days ago with significant b/l shoulder and upper back pain, stiffness without any provocation. Feels like her muscles cramp up over and over. Some dizziness, but no CP, SOB, fever, HAs, midline neck pain, numbness or tingling in extremities, redness, swelling, rashes. This has happened in the past but only on the right side and was self resolving. Has tried tylenol, ibuprofen, and got a muscle relaxer from a neighbor without any relief.      Past Medical History:  Diagnosis Date  . Atrial fibrillation ([place])   . 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 Diabetes mellitus    type 2  . Diabetes mellitus out of control [facility]) 06/12/2009   [facility] 02/13/14 - Early cataracts OD, no diabetic retinopathy, f/u in 12 months; Dr. Claudean Kinds.   . Hypercholesteremia   . Hypertension   . Hypertension    Hypertensive urgency 05/2006  . TIA (transient ischemic attack) 2017    Patient Active Problem List   Diagnosis Date Noted  . Piriformis syndrome of left side 01/29/2020  . Diabetic neuropathy ([place]) 06/21/2018  . Uncontrolled diabetes mellitus with complication, with long-term current use of insulin ([place])   . Mild obstructive sleep apnea 11/18/2013  . Atrial fibrillation ([place]) 06/07/2009  . Depression 09/29/2008  . Hyperlipidemia associated with type 2 diabetes mellitus ([place]) 08/22/2008  . CHRONIC KIDNEY DISEASE STAGE II ([place]) 08/01/2008  . Essential hypertension 04/16/2007    Past Surgical History:  Procedure Laterality Date  . ARTERY BIOPSY Right 09/18/2017   Procedure: BIOPSY OF RIGHT TEMPORAL ARTERY;  Surgeon: Coralie Keens, MD;  Location: [place];  Service: General;   Laterality: Right;  . DILATION AND CURETTAGE OF UTERUS  1976  . MENISCUS REPAIR  03/09   right knee  . TONSILECTOMY, ADENOIDECTOMY, BILATERAL MYRINGOTOMY AND TUBES  age 54  . TONSILLECTOMY      OB History   No obstetric history on file.      Home Medications    Prior to Admission medications   Medication Sig Start Date End Date Taking? Authorizing Provider  amLODipine (NORVASC) 10 MG tablet Take 1 tablet (10 mg total) by mouth daily. 11/28/19   Leavy Cella, RPH-CPP  atorvastatin (LIPITOR) 80 MG tablet Take 1 tablet (80 mg total) by mouth daily. 07/11/19   Mullis, Kiersten P, DO  Blood Glucose Monitoring Suppl (ONETOUCH VERIO) w/Device KIT Check blood sugar 3 times per day. 01/26/20   Mullis, Kiersten P, DO  Continuous Blood Gluc Receiver (DEXCOM G6 RECEIVER) DEVI 1 kit by Does not apply route as directed. 10/27/19   Zenia Resides, MD  Continuous Blood Gluc Sensor (DEXCOM G6 SENSOR) MISC Inject 3 Devices into the skin as directed. 10/27/19   Zenia Resides, MD  Continuous Blood Gluc Transmit (DEXCOM G6 TRANSMITTER) MISC Inject 1 Device into the skin as directed. 10/27/19   Zenia Resides, MD  cyclobenzaprine (FLEXERIL) 10 MG tablet Take 1 tablet (10 mg total) by mouth 2 (two) times daily as needed for muscle spasms. 01/09/20   Faustino Congress, NP  cyclobenzaprine (FLEXERIL) 5 MG tablet Take 1 tablet (5 mg total)  by mouth 3 (three) times daily as needed for muscle spasms. DO NOT DRIVE WHILE TAKING THIS MEDICATION 04/11/20   Volney American, PA-C  diclofenac Sodium (VOLTAREN) 1 % GEL Apply 2 g topically 4 (four) times daily. 04/11/20   Volney American, PA-C  empagliflozin (JARDIANCE) 25 MG TABS tablet Take 1 tablet (25 mg total) by mouth daily. 01/26/20   Mullis, Kiersten P, DO  gabapentin (NEURONTIN) 300 MG capsule Take 2 capsules (600 mg total) by mouth 3 (three) times daily. As needed for back pain 10/21/19   Mullis, Kiersten P, DO  glucose blood (ONETOUCH VERIO) test  strip Check blood sugar 3 times daily 01/26/20   Mullis, Kiersten P, DO  indapamide (LOZOL) 2.5 MG tablet Take 1 tablet (2.5 mg total) by mouth daily. 11/28/19   Leavy Cella, RPH-CPP  insulin aspart (NOVOLOG FLEXPEN) 100 UNIT/ML FlexPen Inject 35 Units into the skin 2 (two) times daily before a meal. 09/08/19   Hensel, Jamal Collin, MD  insulin glargine (LANTUS SOLOSTAR) 100 UNIT/ML Solostar Pen Inject 80 Units into the skin 2 (two) times daily. Take 40 units every morning and take 38 units every evening 10/21/19   Mullis, Kiersten P, DO  liraglutide (VICTOZA) 18 MG/3ML SOPN Inject 0.3 mLs (1.8 mg total) into the skin daily before breakfast. 01/26/20   Mullis, Kiersten P, DO  lisinopril (ZESTRIL) 40 MG tablet TAKE 1 TABLET(40 MG) BY MOUTH DAILY 12/16/19   Mullis, Kiersten P, DO  nystatin (MYCOSTATIN/NYSTOP) powder Apply topically 4 (four) times daily as needed. Patient taking differently: Apply topically 2 (two) times daily.  07/11/19   Mullis, Kiersten P, DO  OneTouch Delica Lancets 80D MISC Check blood sugar 3 times daily 01/26/20   Mullis, Kiersten P, DO  rivaroxaban (XARELTO) 20 MG TABS tablet Take 1 tablet (20 mg total) by mouth daily with supper. 01/26/19   Harriet Butte, DO  amLODipine (NORVASC) 5 MG tablet Take 1 tablet (5 mg total) by mouth daily. 11/09/18   Leeanne Rio, MD    Family History Family History  Problem Relation Age of Onset  . Heart failure Mother   . Diabetes Father   . Hypertension Father   . Cancer Father   . Lung cancer Sister   . Stroke Brother   . Diabetes Brother   . Stroke Maternal Grandmother   . Heart disease Maternal Grandfather     Social History Social History   Tobacco Use  . Smoking status: Never Smoker  . Smokeless tobacco: Never Used  Vaping Use  . Vaping Use: Never used  Substance Use Topics  . Alcohol use: No  . Drug use: No     Allergies   Propoxyphene n-acetaminophen, Glipizide, and Metformin and related   Review of  Systems Review of Systems PER HPI  Physical Exam Triage Vital Signs ED Triage Vitals  Enc Vitals Group     BP 04/11/20 0845 (!) 192/101     Pulse Rate 04/11/20 0845 92     Resp 04/11/20 0845 17     Temp 04/11/20 0845 97.8 F (36.6 C)     Temp Source 04/11/20 0845 Oral     SpO2 04/11/20 0845 98 %     Weight --      Height --      Head Circumference --      Peak Flow --      Pain Score 04/11/20 0848 9     Pain Loc --  Pain Edu? --      Excl. in [place]? --    No data found.  Updated Vital Signs BP (!) 192/101 (BP Location: Right Arm) Comment: Pt states she took both of her blood pressure medications  Pulse 92   Temp 97.8 F (36.6 C) (Oral)   Resp 17   SpO2 98%   Visual Acuity Right Eye Distance:   Left Eye Distance:   Bilateral Distance:    Right Eye Near:   Left Eye Near:    Bilateral Near:     Physical Exam Vitals and nursing note reviewed.  Constitutional:      Appearance: Normal appearance. She is not ill-appearing.  HENT:     Head: Atraumatic.     Right Ear: Tympanic membrane normal.     Left Ear: Tympanic membrane normal.     Nose: Nose normal.     Mouth/Throat:     Mouth: Mucous membranes are moist.     Pharynx: Oropharynx is clear.  Eyes:     Extraocular Movements: Extraocular movements intact.     Conjunctiva/sclera: Conjunctivae normal.  Cardiovascular:     Rate and Rhythm: Normal rate and regular rhythm.     Heart sounds: Normal heart sounds.  Pulmonary:     Effort: Pulmonary effort is normal.     Breath sounds: Normal breath sounds.  Musculoskeletal:        General: Tenderness (b/l cervical paraspinal muscles into trapezius muscles ttp and contracted) present. No swelling or deformity.     Cervical back: Normal range of motion and neck supple. No rigidity or tenderness (no midline c spine ttp).     Comments: In wheelchair Good full ROM in b/l UEs Grip strength full and equal b/l UEs  Skin:    General: Skin is warm and dry.   Neurological:     Mental Status: She is alert and oriented to person, place, and time.     Sensory: No sensory deficit.  Psychiatric:        Mood and Affect: Mood normal.        Thought Content: Thought content normal.        Judgment: Judgment normal.    UC Treatments / Results  Labs (all labs ordered are listed, but only abnormal results are displayed) Labs Reviewed - No data to display  EKG   Radiology No results found.  Procedures Procedures (including critical care time)  Medications Ordered in UC Medications - No data to display  Initial Impression / Assessment and Plan / UC Course  I have reviewed the triage vital signs and the nursing notes.  Pertinent labs & imaging results that were available during my care of the patient were reviewed by me and considered in my medical decision making (see chart for details).     Suspect muscle spasms, will treat conservatively with heat, voltaren gel, biofreeze, massage, stretches, flexeril prn with precautions. Avoid prednisone given uncontrolled DM for now though if worsening may require short course. Return precautions reviewed for worsening or not resolving sxs.   Final Clinical Impressions(s) / UC Diagnoses   Final diagnoses:  Trapezius muscle spasm     Discharge Instructions     Take over the counter tylenol and ibuprofen as needed, can also use heat, massage, voltaren gel to areas of pain, and flexeril (muscle relaxer) as needed. The muscle relaxer can make you feel groggy so use caution and avoid other sedating medications, alcohol, and driving while taking.  ED Prescriptions    Medication Sig Dispense Auth. Provider   diclofenac Sodium (VOLTAREN) 1 % GEL Apply 2 g topically 4 (four) times daily. 100 g ,  Elizabeth, PA-C   cyclobenzaprine (FLEXERIL) 5 MG tablet Take 1 tablet (5 mg total) by mouth 3 (three) times daily as needed for muscle spasms. DO NOT DRIVE WHILE TAKING THIS MEDICATION 30 tablet  ,  Elizabeth, PA-C     PDMP not reviewed this encounter.   ,  Elizabeth, PA-C 04/11/20 1032  

## 2020-05-07 ENCOUNTER — Other Ambulatory Visit: Payer: Medicare Other

## 2020-06-20 ENCOUNTER — Other Ambulatory Visit: Payer: Self-pay

## 2020-06-20 ENCOUNTER — Emergency Department (HOSPITAL_COMMUNITY)
Admission: EM | Admit: 2020-06-20 | Discharge: 2020-06-20 | Disposition: A | Discharge status: Home / Self Care | Payer: Medicare Other | Attending: Emergency Medicine | Admitting: Emergency Medicine

## 2020-06-20 ENCOUNTER — Telehealth: Payer: Self-pay

## 2020-06-20 ENCOUNTER — Emergency Department (HOSPITAL_COMMUNITY): Payer: Medicare Other

## 2020-06-20 ENCOUNTER — Other Ambulatory Visit (HOSPITAL_COMMUNITY): Payer: Self-pay | Admitting: Emergency Medicine

## 2020-06-20 ENCOUNTER — Encounter (HOSPITAL_COMMUNITY): Payer: Self-pay

## 2020-06-20 DIAGNOSIS — R42 Dizziness and giddiness: Secondary | ICD-10-CM | POA: Insufficient documentation

## 2020-06-20 DIAGNOSIS — E1140 Type 2 diabetes mellitus with diabetic neuropathy, unspecified: Secondary | ICD-10-CM | POA: Insufficient documentation

## 2020-06-20 DIAGNOSIS — Z794 Long term (current) use of insulin: Secondary | ICD-10-CM | POA: Diagnosis not present

## 2020-06-20 DIAGNOSIS — I10 Essential (primary) hypertension: Secondary | ICD-10-CM | POA: Diagnosis not present

## 2020-06-20 DIAGNOSIS — Z79899 Other long term (current) drug therapy: Secondary | ICD-10-CM | POA: Insufficient documentation

## 2020-06-20 DIAGNOSIS — R009 Unspecified abnormalities of heart beat: Secondary | ICD-10-CM | POA: Insufficient documentation

## 2020-06-20 DIAGNOSIS — G459 Transient cerebral ischemic attack, unspecified: Secondary | ICD-10-CM | POA: Diagnosis not present

## 2020-06-20 DIAGNOSIS — R519 Headache, unspecified: Secondary | ICD-10-CM | POA: Diagnosis present

## 2020-06-20 LAB — I-STAT CHEM 8, ED
BUN: 9 mg/dL (ref 8–23)
Calcium, Ion: 0.96 mmol/L — ABNORMAL LOW (ref 1.15–1.40)
Chloride: 104 mmol/L (ref 98–111)
Creatinine, Ser: 0.8 mg/dL (ref 0.44–1.00)
Glucose, Bld: 285 mg/dL — ABNORMAL HIGH (ref 70–99)
HCT: 42 % (ref 36.0–46.0)
Hemoglobin: 14.3 g/dL (ref 12.0–15.0)
Potassium: 4.2 mmol/L (ref 3.5–5.1)
Sodium: 137 mmol/L (ref 135–145)
TCO2: 24 mmol/L (ref 22–32)

## 2020-06-20 LAB — COMPREHENSIVE METABOLIC PANEL
ALT: 11 U/L (ref 0–44)
AST: 20 U/L (ref 15–41)
Albumin: 3.5 g/dL (ref 3.5–5.0)
Alkaline Phosphatase: 96 U/L (ref 38–126)
Anion gap: 11 (ref 5–15)
BUN: 8 mg/dL (ref 8–23)
CO2: 23 mmol/L (ref 22–32)
Calcium: 9.1 mg/dL (ref 8.9–10.3)
Chloride: 100 mmol/L (ref 98–111)
Creatinine, Ser: 1.03 mg/dL — ABNORMAL HIGH (ref 0.44–1.00)
GFR, Estimated: >60 mL/min (ref >60)
Glucose, Bld: 279 mg/dL — ABNORMAL HIGH (ref 70–99)
Potassium: 4.2 mmol/L (ref 3.5–5.1)
Sodium: 134 mmol/L — ABNORMAL LOW (ref 135–145)
Total Bilirubin: 0.2 mg/dL — ABNORMAL LOW (ref 0.3–1.2)
Total Protein: 7.2 g/dL (ref 6.5–8.1)

## 2020-06-20 LAB — DIFFERENTIAL
Abs Immature Granulocytes: 0.03 10*3/uL (ref 0.00–0.07)
Basophils Absolute: 0 10*3/uL (ref 0.0–0.1)
Basophils Relative: 0 %
Eosinophils Absolute: 0.1 10*3/uL (ref 0.0–0.5)
Eosinophils Relative: 1 %
Immature Granulocytes: 0 %
Lymphocytes Relative: 14 %
Lymphs Abs: 1 10*3/uL (ref 0.7–4.0)
Monocytes Absolute: 0.4 10*3/uL (ref 0.1–1.0)
Monocytes Relative: 6 %
Neutro Abs: 5.9 10*3/uL (ref 1.7–7.7)
Neutrophils Relative %: 79 %

## 2020-06-20 LAB — ETHANOL: Alcohol, Ethyl (B): <10 mg/dL (ref <10)

## 2020-06-20 LAB — PROTIME-INR
INR: 1.5 — ABNORMAL HIGH (ref 0.8–1.2)
Prothrombin Time: 17.7 seconds — ABNORMAL HIGH (ref 11.4–15.2)

## 2020-06-20 LAB — CBC
HCT: 41.8 % (ref 36.0–46.0)
Hemoglobin: 12.7 g/dL (ref 12.0–15.0)
MCH: 22.5 pg — ABNORMAL LOW (ref 26.0–34.0)
MCHC: 30.4 g/dL (ref 30.0–36.0)
MCV: 74 fL — ABNORMAL LOW (ref 80.0–100.0)
Platelets: 286 10*3/uL (ref 150–400)
RBC: 5.65 MIL/uL — ABNORMAL HIGH (ref 3.87–5.11)
RDW: 14.9 % (ref 11.5–15.5)
WBC: 7.4 10*3/uL (ref 4.0–10.5)
nRBC: 0 % (ref 0.0–0.2)

## 2020-06-20 LAB — APTT: aPTT: 40 seconds — ABNORMAL HIGH (ref 24–36)

## 2020-06-20 MED ORDER — LISINOPRIL 40 MG PO TABS: 40.0000 mg | ORAL_TABLET | Freq: Every day | ORAL | 2 refills | Status: DC

## 2020-06-20 MED ORDER — ATORVASTATIN CALCIUM 80 MG PO TABS
80.0000 mg | ORAL_TABLET | Freq: Every day | ORAL | 2 refills | Status: DC
Start: 2020-06-20 — End: 2020-07-12

## 2020-06-20 MED ORDER — KETOROLAC TROMETHAMINE 15 MG/ML IJ SOLN
15.0000 mg | Freq: Once | INTRAMUSCULAR | Status: AC
  Administered 2020-06-20: 15 mg via INTRAVENOUS
  Filled 2020-06-20: qty 1

## 2020-06-20 MED ORDER — SODIUM CHLORIDE 0.9 % IV BOLUS
500.0000 mL | Freq: Once | INTRAVENOUS | Status: AC
  Administered 2020-06-20: 500 mL via INTRAVENOUS

## 2020-06-20 MED ORDER — PROCHLORPERAZINE EDISYLATE 10 MG/2ML IJ SOLN
10.0000 mg | Freq: Once | INTRAMUSCULAR | Status: AC
  Administered 2020-06-20: 10 mg via INTRAVENOUS
  Filled 2020-06-20: qty 2

## 2020-06-20 MED ORDER — LISINOPRIL 40 MG PO TABS
40.0000 mg | ORAL_TABLET | Freq: Every day | ORAL | 2 refills | Status: DC
Start: 2020-06-20 — End: 2020-07-12

## 2020-06-20 MED ORDER — METOCLOPRAMIDE HCL 5 MG/ML IJ SOLN
10.0000 mg | Freq: Once | INTRAMUSCULAR | Status: AC
  Administered 2020-06-20: 10 mg via INTRAVENOUS
  Filled 2020-06-20: qty 2

## 2020-06-20 MED ORDER — MIDAZOLAM HCL 2 MG/2ML IJ SOLN
2.0000 mg | Freq: Once | INTRAMUSCULAR | Status: AC | PRN
  Administered 2020-06-20: 2 mg via INTRAVENOUS
  Filled 2020-06-20: qty 2

## 2020-06-20 MED ORDER — HYDRALAZINE HCL 20 MG/ML IJ SOLN
10.0000 mg | Freq: Once | INTRAMUSCULAR | Status: DC
  Filled 2020-06-20: qty 1

## 2020-06-20 MED FILL — ATORVASTATIN CALCIUM 80 MG: 80 | 30 days supply | Qty: 30 | Fill #0

## 2020-06-20 MED FILL — LISINOPRIL 40 MG TABS: 40 | 30 days supply | Qty: 30 | Fill #0

## 2020-06-20 NOTE — ED Provider Notes (Signed)
Pt care assumed at 1500.  Pt with hx/o HTN, CVA here with HA, dizziness this morning.  MRI pending to r/o CVA  MRI neg for acute CVA, demonstrates chronic changes related to HTN.  Pt is feeling improved on recheck with resolution of HA.  Discussed outpatient follow up, medication compliance and return precautions.     Quintella Reichert, MD 06/20/20 [phone]

## 2020-06-20 NOTE — Telephone Encounter (Signed)
Received fax from pharmacy requesting a PA on Novolog Flexpen. Patients insurance will only cover Humalog. Has patient been on Humalog before without success? I could not find in recent chart notes. Please advise.

## 2020-06-20 NOTE — Discharge Planning (Signed)
RNCM consulted in regards to medication assistance.  Pt has insurance coverage and is not eligible for Medication Assistance Through  (MATCH) program. 

## 2020-06-20 NOTE — Progress Notes (Signed)
   06/20/20 1524  TOC ED Mini Assessment  TOC Time spent with patient (minutes): 45  PING Used in TOC Assessment No  Admission or Readmission Diverted Yes  Interventions which prevented an admission or readmission Medication Review  What brought you to the Emergency Department?  "I though I was having a stroke."  Barriers to Discharge ED Medication assistance  Barrier interventions affording medication  Means of departure Car    RNCM consulted regarding pt requiring help with Rx co-pay.  Pt is newly homeless and needs assistance.  RNCM assisted with co-pay through petty cash. [facility] will deliver Rx to patient at bedside prior to discharge from hospital.

## 2020-06-20 NOTE — ED Notes (Signed)
Patient transported to MRI 

## 2020-06-20 NOTE — ED Triage Notes (Signed)
Pt arrived PIV with c/o of headache, dizziness. Pt states that she went to sleep at 6am with a headache and woke up with the same headache and dizziness. Pt denies any slurred speech or weakness. Pt neuro assessment intake in triage. Pt reports she has h/o HTN  But hasn't been able to afford her bp medication.Pt is hypertensive in triage.

## 2020-06-20 NOTE — ED Provider Notes (Signed)
[place] EMERGENCY DEPARTMENT Provider Note   CSN: [id number] Arrival date & time: 06/20/20  1218     History CC:  Headache  [name] is a 64 y.o. female with history of hypertension, insulin-dependent diabetes, TIA, high cholesterol, CVA, A. fib on Xarelto, presenting to the emergency department with complaint of headache.  Patient ports that she woke up with a headache around 6 AM this morning.  She also felt lightheaded with this.  She denied any other new neurological symptoms.  She got her grandson ready for school then took a nap, woke up again with a worsening headache.  She does suffer from headaches occasionally but this feels more severe than normal.  It is located in the back of her neck and seems to radiate towards the middle of her head.  It is currently a 9 out of 10.  It is worse with repositioning of her head.  She is concerned because her blood pressure was very high at home, around 892 systolic when she checked.  She denies any vomiting, numbness or weakness of the arms or legs, speech difficulty.  She reports that she is under significant stressors in her life.  She and her family recent became homeless and they are staying with friends.  She also reports that she has not been able to take all of her medications at home, as several of them have been packed up and she cannot get to them.  She is currently taking her Xarelto, her insulin, her amlodipine, and her gabapentin.  She does not have her Lozol or Lisinopril or Atorvastatin.  She reports she received her pzifer covid booster shot yesterday.  HPI     Past Medical History:  Diagnosis Date  . Atrial fibrillation ([place])   . 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 Diabetes mellitus    type 2  . Diabetes mellitus out of control [facility]) 06/12/2009   [facility]hthal[facility] 02/13/14 - Early cataracts OD, no diabetic retinopathy, f/u in 12 months; Dr.  Claudean Kinds.   . Hypercholesteremia   . Hypertension   . Hypertension    Hypertensive urgency 05/2006  . TIA (transient ischemic attack) 2017    Patient Active Problem List   Diagnosis Date Noted  . Piriformis syndrome of left side 01/29/2020  . Diabetic neuropathy ([place]) 06/21/2018  . Uncontrolled diabetes mellitus with complication, with long-term current use of insulin ([place])   . Mild obstructive sleep apnea 11/18/2013  . Atrial fibrillation ([place]) 06/07/2009  . Depression 09/29/2008  . Hyperlipidemia associated with type 2 diabetes mellitus ([place]) 08/22/2008  . CHRONIC KIDNEY DISEASE STAGE II ([facility]) 08/01/2008  . Essential hypertension 04/16/2007    Past Surgical History:  Procedure Laterality Date  . ARTERY BIOPSY Right 09/18/2017   Procedure: BIOPSY OF RIGHT TEMPORAL ARTERY;  Surgeon: Coralie Keens, MD;  Location: [facility];  Service: General;  Laterality: Right;  . DILATION AND CURETTAGE OF UTERUS  1976  . MENISCUS REPAIR  03/09   right knee  . TONSILECTOMY, ADENOIDECTOMY, BILATERAL MYRINGOTOMY AND TUBES  age 48  . TONSILLECTOMY       OB History   No obstetric history on file.     Family History  Problem Relation Age of Onset  . Heart failure Mother   . Diabetes Father   . Hypertension Father   . Cancer Father   . Lung cancer Sister   . Stroke Brother   . Diabetes  Brother   . Stroke Maternal Grandmother   . Heart disease Maternal Grandfather     Social History   Tobacco Use  . Smoking status: Never Smoker  . Smokeless tobacco: Never Used  Vaping Use  . Vaping Use: Never used  Substance Use Topics  . Alcohol use: No  . Drug use: No    Home Medications Prior to Admission medications   Medication Sig Start Date End Date Taking? Authorizing Provider  acetaminophen (TYLENOL) 500 MG tablet Take 1,000 mg by mouth every 6 (six) hours as needed for moderate pain.   Yes [provider]  amLODipine (NORVASC) 10 MG tablet Take 1 tablet (10 mg total) by  mouth daily. 11/28/19  Yes Leavy Cella, RPH-CPP  atorvastatin (LIPITOR) 80 MG tablet Take 1 tablet (80 mg total) by mouth daily. 07/11/19  Yes Mullis, Kiersten P, DO  BESIVANCE 0.6 % SUSP Place 1 drop into the left eye 3 (three) times daily. 05/23/20  Yes [provider]  diclofenac Sodium (VOLTAREN) 1 % GEL Apply 2 g topically 4 (four) times daily. 04/11/20  Yes Volney American, PA-C  Difluprednate 0.05 % EMUL Place 1 drop into the left eye in the morning, at noon, and at bedtime.  05/14/20  Yes [provider]  empagliflozin (JARDIANCE) 25 MG TABS tablet Take 1 tablet (25 mg total) by mouth daily. 01/26/20  Yes Mullis, Kiersten P, DO  gabapentin (NEURONTIN) 300 MG capsule Take 2 capsules (600 mg total) by mouth 3 (three) times daily. As needed for back pain Patient taking differently: Take 600 mg by mouth 3 (three) times daily.  10/21/19  Yes Mullis, Kiersten P, DO  insulin aspart (NOVOLOG FLEXPEN) 100 UNIT/ML FlexPen Inject 35 Units into the skin 2 (two) times daily before a meal. Patient taking differently: Inject 33-35 Units into the skin 2 (two) times daily before a meal. Takes 35 units in the morning and 33 units in the evening 09/08/19  Yes Hensel, Jamal Collin, MD  insulin glargine (LANTUS SOLOSTAR) 100 UNIT/ML Solostar Pen Inject 80 Units into the skin 2 (two) times daily. Take 40 units every morning and take 38 units every evening Patient taking differently: Inject 65 Units into the skin 2 (two) times daily.  10/21/19  Yes Mullis, Kiersten P, DO  lisinopril (ZESTRIL) 40 MG tablet TAKE 1 TABLET(40 MG) BY MOUTH DAILY Patient taking differently: Take 40 mg by mouth daily.  12/16/19  Yes Mullis, Kiersten P, DO  nystatin (MYCOSTATIN/NYSTOP) powder Apply topically 4 (four) times daily as needed. Patient taking differently: Apply 1 application topically 2 (two) times daily.  07/11/19  Yes Mullis, Kiersten P, DO  PROLENSA 0.07 % SOLN Place 1 drop into the left eye at bedtime.  05/20/20  Yes [provider]  rivaroxaban (XARELTO) 20 MG TABS tablet Take 1 tablet (20 mg total) by mouth daily with supper. 01/26/19  Yes Harriet Butte, DO  atorvastatin (LIPITOR) 80 MG tablet Take 1 tablet (80 mg total) by mouth daily. 06/20/20 07/20/20  Wyvonnia Dusky, MD  Blood Glucose Monitoring Suppl (ONETOUCH VERIO) w/Device KIT Check blood sugar 3 times per day. 01/26/20   Mullis, Kiersten P, DO  Continuous Blood Gluc Receiver (DEXCOM G6 RECEIVER) DEVI 1 kit by Does not apply route as directed. 10/27/19   Zenia Resides, MD  Continuous Blood Gluc Sensor (DEXCOM G6 SENSOR) MISC Inject 3 Devices into the skin as directed. 10/27/19   Zenia Resides, MD  Continuous Blood Gluc Transmit [facility]  G6 TRANSMITTER) MISC Inject 1 Device into the skin as directed. 10/27/19   Zenia Resides, MD  cyclobenzaprine (FLEXERIL) 10 MG tablet Take 1 tablet (10 mg total) by mouth 2 (two) times daily as needed for muscle spasms. Patient not taking: Reported on 06/20/2020 01/09/20   Faustino Congress, NP  cyclobenzaprine (FLEXERIL) 5 MG tablet Take 1 tablet (5 mg total) by mouth 3 (three) times daily as needed for muscle spasms. DO NOT DRIVE WHILE TAKING THIS MEDICATION Patient not taking: Reported on 06/20/2020 04/11/20   Volney American, PA-C  glucose blood [facility] VERIO) test strip Check blood sugar 3 times daily 01/26/20   Mullis, Kiersten P, DO  indapamide (LOZOL) 2.5 MG tablet Take 1 tablet (2.5 mg total) by mouth daily. Patient not taking: Reported on 06/20/2020 11/28/19   Leavy Cella, RPH-CPP  liraglutide (VICTOZA) 18 MG/3ML SOPN Inject 0.3 mLs (1.8 mg total) into the skin daily before breakfast. Patient not taking: Reported on 06/20/2020 01/26/20   Mina Marble P, DO  lisinopril (ZESTRIL) 40 MG tablet Take 1 tablet (40 mg total) by mouth daily for 30 doses. 06/20/20 07/20/20  Wyvonnia Dusky, MD  OneTouch Delica Lancets 38H MISC Check blood sugar 3 times daily 01/26/20    Mullis, Kiersten P, DO  amLODipine (NORVASC) 5 MG tablet Take 1 tablet (5 mg total) by mouth daily. 11/09/18   Leeanne Rio, MD    Allergies    Propoxyphene n-acetaminophen, Glipizide, and Metformin and related  Review of Systems   Review of Systems  Constitutional: Negative for chills and fever.  HENT: Negative for ear pain and sore throat.   Eyes: Negative for photophobia and visual disturbance.  Respiratory: Negative for cough and shortness of breath.   Cardiovascular: Negative for chest pain and palpitations.  Gastrointestinal: Negative for abdominal pain, nausea and vomiting.  Genitourinary: Negative for dysuria and hematuria.  Musculoskeletal: Negative for arthralgias and back pain.  Skin: Negative for color change and rash.  Neurological: Positive for light-headedness and headaches. Negative for syncope.  Psychiatric/Behavioral: Negative for agitation and confusion.  All other systems reviewed and are negative.   Physical Exam Updated Vital Signs BP (!) 169/87   Pulse 88   Temp 98 F (36.7 C) (Oral)   Resp 16   SpO2 98%   Physical Exam Vitals and nursing note reviewed.  Constitutional:      General: She is not in acute distress.    Appearance: She is well-developed.  HENT:     Head: Normocephalic and atraumatic.  Eyes:     Conjunctiva/sclera: Conjunctivae normal.  Cardiovascular:     Rate and Rhythm: Normal rate. Rhythm irregular.     Pulses: Normal pulses.  Pulmonary:     Effort: Pulmonary effort is normal. No respiratory distress.  Abdominal:     Palpations: Abdomen is soft.     Tenderness: There is no abdominal tenderness.  Musculoskeletal:     Cervical back: Neck supple.  Skin:    General: Skin is warm and dry.  Neurological:     General: No focal deficit present.     Mental Status: She is alert and oriented to person, place, and time.     Cranial Nerves: No cranial nerve deficit.     Sensory: No sensory deficit.     Motor: No weakness.    Psychiatric:        Mood and Affect: Mood normal.        Behavior: Behavior normal.  ED Results / Procedures / Treatments   Labs (all labs ordered are listed, but only abnormal results are displayed) Labs Reviewed  PROTIME-INR - Abnormal; Notable for the following components:      Result Value   Prothrombin Time 17.7 (*)    INR 1.5 (*)    All other components within normal limits  APTT - Abnormal; Notable for the following components:   aPTT 40 (*)    All other components within normal limits  CBC - Abnormal; Notable for the following components:   RBC 5.65 (*)    MCV 74.0 (*)    MCH 22.5 (*)    All other components within normal limits  COMPREHENSIVE METABOLIC PANEL - Abnormal; Notable for the following components:   Sodium 134 (*)    Glucose, Bld 279 (*)    Creatinine, Ser 1.03 (*)    Total Bilirubin 0.2 (*)    All other components within normal limits  I-STAT CHEM 8, ED - Abnormal; Notable for the following components:   Glucose, Bld 285 (*)    Calcium, Ion 0.96 (*)    All other components within normal limits  ETHANOL  DIFFERENTIAL  RAPID URINE DRUG SCREEN, HOSP PERFORMED  URINALYSIS, ROUTINE W REFLEX MICROSCOPIC    EKG EKG Interpretation  Date/Time:  Wednesday June 20 2020 12:48:00 EST Ventricular Rate:  90 PR Interval:  158 QRS Duration: 75 QT Interval:  314 QTC Calculation: 385 R Axis:   43 Text Interpretation: Sinus rhythm Low voltage, precordial leads Probable anteroseptal infarct, old No STEMI Confirmed by Octaviano Glow [phone]) on 06/20/2020 12:50:44 PM   Radiology CT HEAD WO CONTRAST  Result Date: 06/20/2020 CLINICAL DATA:  Headache with dizziness EXAM: CT HEAD WITHOUT CONTRAST TECHNIQUE: Contiguous axial images were obtained from the base of the skull through the vertex without intravenous contrast. COMPARISON:  Head CT and brain MRI September 15, 2017 FINDINGS: Brain: Ventricles and sulci are normal in size and configuration. There is no  intracranial mass, hemorrhage, extra-axial fluid collection, or midline shift. There is patchy small vessel disease in the centra semiovale bilaterally. Elsewhere brain parenchyma appears unremarkable. No acute infarct is evident. Vascular: No hyperdense vessel. There is calcification in each carotid siphon region. Skull: The bony calvarium appears intact. Sinuses/Orbits: There is opacification in multiple ethmoid air cells as well as in the posterior right sphenoid sinus region. Visualized orbits appear symmetric bilaterally. Other: Visualized mastoid air cells are clear. IMPRESSION: Patchy periventricular small vessel disease. No acute infarct. No mass or hemorrhage. There are foci of arterial vascular calcification. There is multifocal paranasal sinus disease. Electronically Signed   By: Lowella Grip III [name]   On: 06/20/2020 13:25    Procedures Procedures (including critical care time)  Medications Ordered in ED Medications  hydrALAZINE (APRESOLINE) injection 10 mg (0 mg Intravenous Hold 06/20/20 1615)  midazolam (VERSED) injection 2 mg (has no administration in time range)  metoCLOPramide (REGLAN) injection 10 mg (10 mg Intravenous Given 06/20/20 1608)  ketorolac (TORADOL) 15 MG/ML injection 15 mg (15 mg Intravenous Given 06/20/20 1559)  prochlorperazine (COMPAZINE) injection 10 mg (10 mg Intravenous Given 06/20/20 1609)  sodium chloride 0.9 % bolus 500 mL (500 mLs Intravenous New Bag/Given 06/20/20 1558)    ED Course  I have reviewed the triage vital signs and the nursing notes.  Pertinent labs & imaging results that were available during my care of the patient were reviewed by me and considered in my medical decision making (see chart for details).  This patient complains of headache.  This involves an extensive number of treatment options, and is a complaint that carries with it a high risk of complications and morbidity.  The differential diagnosis includes migraine vs hypertensive  urgency/emergency vs CVA vs tension/muscular headache vs vaccine booster side effects vs other  This may very likely be reactionary to her covid booster shot yesterday, as headaches are common side effects.  If this is the case I expect symptomatic improvement over the next 2 days.  However with her medical comorbidities, including her high stroke risk and prior lacunar infarcts, we'll perform a CVA evaluation here.  CTH was performed after arrival with multiple chronic findings but no acute stroke noted.  We'll proceed to MRI of the brain.  The patient reports claustrophobia and will need versed for anxiety prior to her scan, which I've ordered.  Her BP improved with a headache cocktail here.  We'll help her fill her missing scripts (lisinopril, atorvastatin - she confirms she has her other medications including xarelto at home).  She is recently on medicare and can now afford these medications, although money is tight for her, and she reports she and her family are homeless (and staying with friends).  Lower suspicion for meningitis or SAH at this time.  I ordered, reviewed, and interpreted labs, which included CMP (Cr 1.03, Na 134, Glucose 279), CBC (Wbc 7.4, Hgb 12.7), INR 1.5. I ordered medication IV compazine, IV reglan, and IV toradol for headache I ordered imaging studies which included CTH and MRI Brain ECG per my interpretation shows NSR with no acute ischemic findings   Clinical Course as of Jun 21 1707  Wed Jun 20, 2020  1332 IMPRESSION: Patchy periventricular small vessel disease. No acute infarct. No mass or hemorrhage. There are foci of arterial vascular calcification. There is multifocal paranasal sinus disease.   [MT]  1628 Pt signed out to Dr Ralene Bathe EDP with plan to f/u on MRI scan - if no acute findings, this is likely related to covid booster side effects, and she can be discharged home.  ToC team assisted with providing lisinopril and statin for discharge.   [MT]      Clinical Course User Index [MT] Christell Steinmiller, Carola Rhine, MD    Final Clinical Impression(s) / ED Diagnoses Final diagnoses:  None    Rx / DC Orders ED Discharge Orders         Ordered    atorvastatin (LIPITOR) 80 MG tablet  Daily        06/20/20 1527    lisinopril (ZESTRIL) 40 MG tablet  Daily,   Status:  Discontinued        06/20/20 1527    lisinopril (ZESTRIL) 40 MG tablet  Daily        06/20/20 1533           Wyvonnia Dusky, MD 06/20/20 1725

## 2020-06-21 ENCOUNTER — Other Ambulatory Visit: Payer: Self-pay | Admitting: Family Medicine

## 2020-06-21 DIAGNOSIS — IMO0002 Reserved for concepts with insufficient information to code with codable children: Secondary | ICD-10-CM

## 2020-06-21 DIAGNOSIS — E1165 Type 2 diabetes mellitus with hyperglycemia: Secondary | ICD-10-CM

## 2020-06-21 MED ORDER — INSULIN LISPRO (1 UNIT DIAL) 100 UNIT/ML (KWIKPEN): 35.0000 [IU] | PEN_INJECTOR | Freq: Two times a day (BID) | SUBCUTANEOUS | 11 refills | Status: DC

## 2020-06-21 MED ORDER — VICTOZA 18 MG/3ML ~~LOC~~ SOPN: 1.8000 mg | PEN_INJECTOR | Freq: Every day | SUBCUTANEOUS | Status: DC

## 2020-06-21 NOTE — Telephone Encounter (Signed)
It is okay to switch to Humalog 35 units BID. Per chart review, she was on this prior and unclear when it got switched.

## 2020-07-03 ENCOUNTER — Encounter: Payer: Medicare Other | Admitting: Obstetrics and Gynecology

## 2020-07-07 NOTE — Progress Notes (Deleted)
   Subjective:   Patient ID: [name]    DOB: 03-02-1956, 64 y.o. female   MRN: [id number]  [name] is a 64 y.o. female with a history of A. fib, hypertension, mild OSA, diabetic neuropathy, hyperlipidemia, uncontrolled diabetes, CKD, depression here for diabetes follow-up  Diabetes: Last three A1C's below. Currently on Jardiance 25 mg daily, Lantus 65U BID, Humalog 35U BID, Victoza 1.8mg  QD. Endorses compliance. Notes CBGs range ***. Denies any hypoglycemia. Denies any polyuria, polydipsia, polyphagia. Due for ***.  Lab Results  Component Value Date   HGBA1C 9.6 (A) 01/26/2020   HGBA1C 9.6 (A) 10/21/2019   HGBA1C 13.7 (A) 07/11/2019    HTN:    today. Currently on amlodipine 10 mg daily, Lisinopril 40mg  QD. Endorses compliance. Non-smoker/Current everyday smoker***. Denies any chest pain, SOB, vision changes, or headaches.    HLD: Last lipid panel below. Currently on atorvastatin 80 mg daily. Endorses compliance. Denies any muscles aches or weakness. The 10-year ASCVD risk score [name] DC Jr., et al., 2013) is: 28.1%   Lab Results  Component Value Date   CHOL 189 07/11/2019   HDL 46 07/11/2019   LDLCALC 118 (H) 07/11/2019   LDLDIRECT 51 03/27/2016   TRIG 139 07/11/2019   CHOLHDL 4.1 07/11/2019   Review of Systems:  Per HPI.   Objective:   There were no vitals taken for this visit. Vitals and nursing note reviewed.  General: pleasant ***, sitting comfortably in exam chair, well nourished, well developed, in no acute distress with non-toxic appearance HEENT: normocephalic, atraumatic, moist mucous membranes, oropharynx clear without erythema or exudate, TM normal bilaterally  Neck: supple, non-tender without lymphadenopathy CV: regular rate and rhythm without murmurs, rubs, or gallops, no lower extremity edema, 2+ radial and pedal pulses bilaterally Lungs: clear to auscultation bilaterally with normal work of breathing on room air Resp: breathing comfortably on  room air, speaking in full sentences Abdomen: soft, non-tender, non-distended, no masses or organomegaly palpable, normoactive bowel sounds Skin: warm, dry, no rashes or lesions Extremities: warm and well perfused, normal tone MSK: ROM grossly intact, strength intact, gait normal Neuro: Alert and oriented, speech normal  Assessment & Plan:   No problem-specific Assessment & Plan notes found for this encounter.  No orders of the defined types were placed in this encounter.  No orders of the defined types were placed in this encounter.   Mina Marble, DO PGY-3, [facility] 07/07/2020 10:58 PM

## 2020-07-09 ENCOUNTER — Ambulatory Visit: Payer: Medicare Other | Admitting: Family Medicine

## 2020-07-10 ENCOUNTER — Telehealth: Payer: Self-pay | Admitting: Pharmacist

## 2020-07-10 NOTE — Telephone Encounter (Signed)
-----   Message from Marisa Cyphers, [facility] sent at 07/02/2020  3:22 PM EST ----- Regarding: Clean up home med list I believe this patient has visit scheduled for 12/6 Looks like we filled some meds on way out the door that may exist somewhere else.  TOC scripts have 2 refills on them ... just need patient to understand to have her normal pharmacy call TOC for the transfer of scripts (Lipitor, Lisinopril). Thanks for help with FU.  Clydia Llano, RPh, BCPS Pager: [phone]

## 2020-07-10 NOTE — Telephone Encounter (Signed)
Follow-up phone call after missed appointment 12/6 with Dr. Tarry Kos PCP.   Patient states BP is improved.  And blood sugar as been high 100s and low 200s.  She admits to some readings in the higher 200s when she has dietary indiscretions.   I offered her a pharmacy follow-up if she was interested as I have seen her in the past.  She was asked to consider rescheduling with me or with Dr. Tarry Kos in the near future.    I am concerned that her recent [facility] prescriptions may need transfer or new prescriptions per note from inpatient pharmacist - see note.

## 2020-07-10 NOTE — Telephone Encounter (Signed)
Noted and agree. 

## 2020-07-11 ENCOUNTER — Other Ambulatory Visit: Payer: Self-pay

## 2020-07-11 DIAGNOSIS — E1165 Type 2 diabetes mellitus with hyperglycemia: Secondary | ICD-10-CM

## 2020-07-11 DIAGNOSIS — IMO0002 Reserved for concepts with insufficient information to code with codable children: Secondary | ICD-10-CM

## 2020-07-11 MED ORDER — ONETOUCH DELICA LANCETS 33G MISC: 11 refills | Status: AC

## 2020-07-12 ENCOUNTER — Other Ambulatory Visit: Payer: Self-pay | Admitting: Family Medicine

## 2020-07-12 DIAGNOSIS — I10 Essential (primary) hypertension: Secondary | ICD-10-CM

## 2020-07-12 DIAGNOSIS — E785 Hyperlipidemia, unspecified: Secondary | ICD-10-CM

## 2020-07-12 DIAGNOSIS — E1169 Type 2 diabetes mellitus with other specified complication: Secondary | ICD-10-CM

## 2020-07-12 MED ORDER — ATORVASTATIN CALCIUM 80 MG PO TABS: 80.0000 mg | ORAL_TABLET | Freq: Every day | ORAL | 2 refills | Status: DC

## 2020-07-12 MED ORDER — LISINOPRIL 40 MG PO TABS: 40.0000 mg | ORAL_TABLET | Freq: Every day | ORAL | 2 refills | Status: DC

## 2020-07-12 NOTE — Telephone Encounter (Signed)
Refills of Lisinopril and Atorvastatin provided to [facility]

## 2020-08-05 ENCOUNTER — Other Ambulatory Visit: Payer: Self-pay

## 2020-08-05 ENCOUNTER — Encounter (HOSPITAL_COMMUNITY): Payer: Self-pay | Admitting: Emergency Medicine

## 2020-08-05 ENCOUNTER — Ambulatory Visit (HOSPITAL_COMMUNITY)
Admission: EM | Admit: 2020-08-05 | Discharge: 2020-08-05 | Disposition: A | Discharge status: Home / Self Care | Payer: Medicare Other | Attending: Family Medicine | Admitting: Family Medicine

## 2020-08-05 DIAGNOSIS — R21 Rash and other nonspecific skin eruption: Secondary | ICD-10-CM | POA: Diagnosis not present

## 2020-08-05 DIAGNOSIS — B029 Zoster without complications: Secondary | ICD-10-CM

## 2020-08-05 MED ORDER — HYDROCODONE-ACETAMINOPHEN 5-325 MG PO TABS
1.0000 | ORAL_TABLET | Freq: Four times a day (QID) | ORAL | 0 refills | Status: DC | PRN
Start: 2020-08-05 — End: 2020-08-05

## 2020-08-05 MED ORDER — HYDROCODONE-ACETAMINOPHEN 5-325 MG PO TABS
1.0000 | ORAL_TABLET | Freq: Four times a day (QID) | ORAL | 0 refills | Status: DC | PRN
Start: 2020-08-05 — End: 2020-09-27

## 2020-08-05 MED ORDER — VALACYCLOVIR HCL 1 G PO TABS: 1000.0000 mg | ORAL_TABLET | Freq: Three times a day (TID) | ORAL | 0 refills | Status: AC

## 2020-08-05 MED ORDER — CLOBETASOL PROPIONATE 0.05 % EX OINT: 1.0000 "application " | TOPICAL_OINTMENT | Freq: Two times a day (BID) | CUTANEOUS | 0 refills | Status: DC

## 2020-08-05 NOTE — ED Triage Notes (Signed)
Pt here for poss shingles under right breast onset 2 weeks... reports pain radiates to the right back side  Denies f/v/n/d  Taking acetaminophen w/no relief and using lotion w/no relief.   A&O x4... NAD.Marland Kitchen. ambulatory w/cane assist.

## 2020-08-05 NOTE — ED Provider Notes (Signed)
[place]    CSN: [id number] Arrival date & time: 08/05/20  1515      History   Chief Complaint Chief Complaint  Patient presents with  . Rash    HPI [name] is a 65 y.o. female.   Here today with significantly painful burning rash under right breast wrapping around to right side for about 2 weeks now. Mild itching, mostly burning stinging pain worst with pressure applied. Denies new hygiene products or foods, injuries to the area, CP, SOB. Trying gold bond powder without benefit. Has not had shingles shot, thinks this is what she has.      Past Medical History:  Diagnosis Date  . Atrial fibrillation ([place])   . 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 Diabetes mellitus    type 2  . Diabetes mellitus out of control [facility]) 06/12/2009   [facility] 02/13/14 - Early cataracts OD, no diabetic retinopathy, f/u in 12 months; Dr. Claudean Kinds.   . Hypercholesteremia   . Hypertension   . Hypertension    Hypertensive urgency 05/2006  . TIA (transient ischemic attack) 2017    Patient Active Problem List   Diagnosis Date Noted  . Piriformis syndrome of left side 01/29/2020  . Diabetic neuropathy ([place]) 06/21/2018  . Uncontrolled diabetes mellitus with complication, with long-term current use of insulin ([place])   . Mild obstructive sleep apnea 11/18/2013  . Atrial fibrillation ([place]) 06/07/2009  . Depression 09/29/2008  . Hyperlipidemia associated with type 2 diabetes mellitus ([place]) 08/22/2008  . CHRONIC KIDNEY DISEASE STAGE II ([facility]) 08/01/2008  . Essential hypertension 04/16/2007    Past Surgical History:  Procedure Laterality Date  . ARTERY BIOPSY Right 09/18/2017   Procedure: BIOPSY OF RIGHT TEMPORAL ARTERY;  Surgeon: Coralie Keens, MD;  Location: [place];  Service: General;  Laterality: Right;  . DILATION AND CURETTAGE OF UTERUS  1976  . MENISCUS REPAIR  03/09   right knee  . TONSILECTOMY, ADENOIDECTOMY,  BILATERAL MYRINGOTOMY AND TUBES  age 25  . TONSILLECTOMY      OB History   No obstetric history on file.      Home Medications    Prior to Admission medications   Medication Sig Start Date End Date Taking? Authorizing Provider  clobetasol ointment (TEMOVATE) 6.50 % Apply 1 application topically 2 (two) times daily. 08/05/20  Yes Volney American, PA-C  valACYclovir (VALTREX) 1000 MG tablet Take 1 tablet (1,000 mg total) by mouth 3 (three) times daily for 7 days. 08/05/20 08/12/20 Yes Volney American, PA-C  acetaminophen (TYLENOL) 500 MG tablet Take 1,000 mg by mouth every 6 (six) hours as needed for moderate pain.    [provider]  amLODipine (NORVASC) 10 MG tablet Take 1 tablet (10 mg total) by mouth daily. 11/28/19   Leavy Cella, RPH-CPP  atorvastatin (LIPITOR) 80 MG tablet Take 1 tablet (80 mg total) by mouth daily. 07/12/20 04/08/21  Mullis, Kiersten P, DO  BESIVANCE 0.6 % SUSP Place 1 drop into the left eye 3 (three) times daily. 05/23/20   [provider]  Blood Glucose Monitoring Suppl (ONETOUCH VERIO) w/Device KIT Check blood sugar 3 times per day. 01/26/20   Mullis, Kiersten P, DO  Continuous Blood Gluc Receiver (DEXCOM G6 RECEIVER) DEVI 1 kit by Does not apply route as directed. 10/27/19   Zenia Resides, MD  Continuous Blood Gluc Sensor (DEXCOM G6 SENSOR) MISC Inject 3 Devices into the skin as  directed. 10/27/19   Zenia Resides, MD  Continuous Blood Gluc Transmit (DEXCOM G6 TRANSMITTER) MISC Inject 1 Device into the skin as directed. 10/27/19   Zenia Resides, MD  diclofenac Sodium (VOLTAREN) 1 % GEL Apply 2 g topically 4 (four) times daily. 04/11/20   Volney American, PA-C  Difluprednate 0.05 % EMUL Place 1 drop into the left eye in the morning, at noon, and at bedtime.  05/14/20   [provider]  empagliflozin (JARDIANCE) 25 MG TABS tablet Take 1 tablet (25 mg total) by mouth daily. 01/26/20   Mullis, Kiersten P, DO  gabapentin  (NEURONTIN) 300 MG capsule Take 2 capsules (600 mg total) by mouth 3 (three) times daily. As needed for back pain Patient taking differently: Take 600 mg by mouth 3 (three) times daily.  10/21/19   Mullis, Kiersten P, DO  glucose blood (ONETOUCH VERIO) test strip Check blood sugar 3 times daily 01/26/20   Mullis, Kiersten P, DO  HYDROcodone-acetaminophen (NORCO/VICODIN) 5-325 MG tablet Take 1 tablet by mouth every 6 (six) hours as needed for moderate pain. 08/05/20   Volney American, PA-C  insulin glargine (LANTUS SOLOSTAR) 100 UNIT/ML Solostar Pen Inject 80 Units into the skin 2 (two) times daily. Take 40 units every morning and take 38 units every evening Patient taking differently: Inject 65 Units into the skin 2 (two) times daily.  10/21/19   Mullis, Kiersten P, DO  insulin lispro (HUMALOG) 100 UNIT/ML KwikPen Inject 35 Units into the skin in the morning and at bedtime. 06/21/20   Mullis, Kiersten P, DO  liraglutide (VICTOZA) 18 MG/3ML SOPN Inject 1.8 mg into the skin daily before breakfast. 06/21/20   Mullis, Kiersten P, DO  lisinopril (ZESTRIL) 40 MG tablet Take 1 tablet (40 mg total) by mouth daily. 07/12/20   Mullis, Kiersten P, DO  nystatin (MYCOSTATIN/NYSTOP) powder Apply topically 4 (four) times daily as needed. Patient taking differently: Apply 1 application topically 2 (two) times daily.  07/11/19   Mullis, Kiersten P, DO  OneTouch Delica Lancets 31V MISC Check blood sugar 3 times daily 07/11/20   Mullis, Kiersten P, DO  PROLENSA 0.07 % SOLN Place 1 drop into the left eye at bedtime. 05/20/20   [provider]  rivaroxaban (XARELTO) 20 MG TABS tablet Take 1 tablet (20 mg total) by mouth daily with supper. 01/26/19   Harriet [place], DO    Family History Family History  Problem Relation Age of Onset  . Heart failure Mother   . Diabetes Father   . Hypertension Father   . Cancer Father   . Lung cancer Sister   . Stroke Brother   . Diabetes Brother   . Stroke Maternal  Grandmother   . Heart disease Maternal Grandfather     Social History Social History   Tobacco Use  . Smoking status: Never Smoker  . Smokeless tobacco: Never Used  Vaping Use  . Vaping Use: Never used  Substance Use Topics  . Alcohol use: No  . Drug use: No     Allergies   Propoxyphene n-acetaminophen, Glipizide, and Metformin and related   Review of Systems Review of Systems PER HPI   Physical Exam Triage Vital Signs ED Triage Vitals  Enc Vitals Group     BP 08/05/20 1659 104/79     Pulse Rate 08/05/20 1659 81     Resp 08/05/20 1659 16     Temp 08/05/20 1659 98.1 F (36.7 C)     Temp Source  08/05/20 1659 Oral     SpO2 08/05/20 1659 100 %     Weight --      Height --      Head Circumference --      Peak Flow --      Pain Score 08/05/20 1656 10     Pain Loc --      Pain Edu? --      Excl. in [place]? --    No data found.  Updated Vital Signs BP 104/79 (BP Location: Right Arm)   Pulse 81   Temp 98.1 F (36.7 C) (Oral)   Resp 16   SpO2 100%   Visual Acuity Right Eye Distance:   Left Eye Distance:   Bilateral Distance:    Right Eye Near:   Left Eye Near:    Bilateral Near:     Physical Exam Vitals and nursing note reviewed.  Constitutional:      Appearance: Normal appearance. She is not ill-appearing.  HENT:     Head: Atraumatic.  Eyes:     Extraocular Movements: Extraocular movements intact.     Conjunctiva/sclera: Conjunctivae normal.  Cardiovascular:     Rate and Rhythm: Normal rate and regular rhythm.     Heart sounds: Normal heart sounds.  Pulmonary:     Effort: Pulmonary effort is normal.     Breath sounds: Normal breath sounds.  Musculoskeletal:        General: Normal range of motion.     Cervical back: Normal range of motion and neck supple.  Skin:    General: Skin is warm.     Findings: Rash (erythematous vesicular rash present under right breast extending laterally, significantly ttp) present.  Neurological:     Mental Status:  She is alert and oriented to person, place, and time.  Psychiatric:        Mood and Affect: Mood normal.        Thought Content: Thought content normal.        Judgment: Judgment normal.      UC Treatments / Results  Labs (all labs ordered are listed, but only abnormal results are displayed) Labs Reviewed - No data to display  EKG   Radiology No results found.  Procedures Procedures (including critical care time)  Medications Ordered in UC Medications - No data to display  Initial Impression / Assessment and Plan / UC Course  I have reviewed the triage vital signs and the nursing notes.  Pertinent labs & imaging results that were available during my care of the patient were reviewed by me and considered in my medical decision making (see chart for details).     Sxs and exam most consistent with shingles, will treat with valtrex, small amount of norco for severe pain (precautions reviewed at length) and given her uncontrolled DM will use clobetasol ointment rather than prednisone. F/u with PCP next week for recheck.   Final Clinical Impressions(s) / UC Diagnoses   Final diagnoses:  Rash  Herpes zoster without complication   Discharge Instructions   None    ED Prescriptions    Medication Sig Dispense Auth. Provider   clobetasol ointment (TEMOVATE) 3.41 % Apply 1 application topically 2 (two) times daily. 60 g Volney American, [place]   valACYclovir (VALTREX) 1000 MG tablet Take 1 tablet (1,000 mg total) by mouth 3 (three) times daily for 7 days. 21 tablet Volney American, [place]   HYDROcodone-acetaminophen (NORCO/VICODIN) 5-325 MG tablet  (Status: Discontinued) Take 1 tablet by  mouth every 6 (six) hours as needed for moderate pain. 30 tablet Volney American, [place]   HYDROcodone-acetaminophen (NORCO/VICODIN) 5-325 MG tablet Take 1 tablet by mouth every 6 (six) hours as needed for moderate pain. 30 tablet Volney American, [place]     I have reviewed  the PDMP during this encounter.   Volney American, [place] 08/05/20 1825

## 2020-08-24 ENCOUNTER — Other Ambulatory Visit: Payer: Self-pay

## 2020-08-24 ENCOUNTER — Ambulatory Visit
Admission: RE | Admit: 2020-08-24 | Discharge: 2020-08-24 | Disposition: A | Discharge status: Home / Self Care | Payer: Medicare Other | Source: Ambulatory Visit | Attending: Family Medicine | Admitting: Family Medicine

## 2020-08-24 DIAGNOSIS — Z13820 Encounter for screening for osteoporosis: Secondary | ICD-10-CM | POA: Diagnosis not present

## 2020-08-24 DIAGNOSIS — Z780 Asymptomatic menopausal state: Secondary | ICD-10-CM | POA: Diagnosis not present

## 2020-08-24 DIAGNOSIS — E2839 Other primary ovarian failure: Secondary | ICD-10-CM

## 2020-08-26 ENCOUNTER — Encounter: Payer: Self-pay | Admitting: Family Medicine

## 2020-09-02 NOTE — Progress Notes (Signed)
Subjective:   Patient ID: [name]    DOB: 12-22-1955, 65 y.o. female   MRN: [id number]  [name] is a 65 y.o. female with a history of a-fib, HTN, mild OSA, diabetic neuropathy, HLD, uncontrolled T2DM, CKDII, depression here for urgent care follow up   Urgent Care Follow up: Patient presents today after being seen in urgent care on 08/05/20 for a rash uner her right breast wrapping around to the right side for about 2 weeks at that time. Endorsed mild itching and burning/stinging pain. She was diagnosed with shingles and treated with Valtrex and clobetasol ointment. Today she notes the pain is still very. The Clobetasol ointment is not helping. She takes Gabapentin 682m TID. Endorses sharp stabbing pain that occurs at times and is very painful. Denies any ear pain or eye pain.  Diabetes: Last three A1C's below. Prescribed Empagliflozin 270mQD, Lantus 80U QD, Victoza 1.92m85mD, and Humalog 35U BID. Has been taking Lantus 65U at noon and Novolog 30U BID. Has not been taking Jardiance. Does not check blood sugars at home. Has not been using her Dexcom since July. Denies any hypoglycemia. Denies any polyuria, polydipsia, polyphagia. Due for diabetic foot exam.  Lab Results  Component Value Date   HGBA1C 10.0 (A) 09/03/2020   HGBA1C 9.6 (A) 01/26/2020   HGBA1C 9.6 (A) 10/21/2019    HTN:  BP: (!) 158/86 today. Currently on Amlodipine 52m16m and Lisinopril 40mg67m Endorses compliance. However has not taken blood pressure medications prior to appointment. Non-smoker. Denies any chest pain, SOB, vision changes, or headaches.    HLD: Last lipid panel below. Currently on Atorvastatin 80mg 39mDenies compliance. Denies any muscles aches or weakness. The 10-year ASCVD risk score (Goff Mikey Bussing., et al., 2013) is: 31.6%   Lab Results  Component Value Date   CHOL 189 07/11/2019   HDL 46 07/11/2019   LDLCALC 118 (H) 07/11/2019   LDLDIRECT 51 03/27/2016   TRIG 139 07/11/2019   CHOLHDL  4.1 07/11/2019    Review of Systems:  Per HPI.   Objective:   BP (!) 158/86   Pulse 80   Wt 241 lb (109.3 kg)   SpO2 99%   BMI 44.08 kg/m  Vitals and nursing note reviewed.  General: pleasant obese female, appears to be not feeling well, in no acute distress with non-toxic appearance Resp: breathing comfortably on room air, speaking in full sentences Skin: warm, dry, no rash appreciated along right flank to intertriginous area however tender to palpation MSK:  gait normal with cane Neuro: Alert and oriented, speech normal  Assessment & Plan:   Uncontrolled diabetes mellitus with complication, with long-term current use of insulin (HCC) Chronic, Uncontrolled. A1C worsened to 10.0. today. Currently monitoring and treating with insulin therapy with >3 injection per day.  Dr. Koval Valentina Lucksated patient during visit. Plan to follow up on Thursday for further evaluate and adjust medications - Restart Jardiacne 25mg Q60mContinue Lantus 65U daily. Insulin needle rx provided. - Continue Novolog 30U BID - Continue Victoza daily. May benefit from transition to qweekly dosing to minimize daily injection - Reorder Dexcom supplies - Follow up with PCP in 1 month for continued monitoring. - Plan for diabetic foot exam at follow up visit.  Hyperlipidemia associated with type 2 diabetes mellitus Chronic. Noncompliant with statin - Education provided - Restart Atorvastatin 80mg QD82mecheck lipid panel in 3months 77monthial hypertension Chronic. Elevated BP today in setting of not taking BP  meds this morning. Will hold off on adjustments at this time. Follow up in 1 month. Patient to be sure to take medications prior to visit. Adjust medications if remains elevated at follow up visit.  Postherpetic neuralgia Acute. Rash has resolved but symptoms persist with inadequate treatment with steroid cream. comlpeted Vicodin prescritpion. - Increase Gabapentin to 922m TID x 1 month, then return to  6050mTID - Start Lidocaine Patch  - If no improvement with above measures, can evaluate for transition of Gabapentin to Amitriptyline vs antiepileptic medication (valproic acid, carbamazepine, lamotrigine) vs SNRI (duloxetine, venlafaxine)  Health Maintenance; Due to time, was unable to cover health maintenance today. Will discuss at follow up visit. - due for TDAP, PNA vaccine - due for colonoscopy - due for diabetic foot exam - plan to obtain at 1 mo follow up  Orders Placed This Encounter  Procedures  . POCT glycosylated hemoglobin (Hb A1C)   Meds ordered this encounter  Medications  . empagliflozin (JARDIANCE) 25 MG TABS tablet    Sig: Take 1 tablet (25 mg total) by mouth daily.    Dispense:  90 tablet    Refill:  3  . gabapentin (NEURONTIN) 300 MG capsule    Sig: Take 3 capsules (900 mg total) by mouth 3 (three) times daily.    Dispense:  270 capsule    Refill:  0  . atorvastatin (LIPITOR) 80 MG tablet    Sig: Take 1 tablet (80 mg total) by mouth daily. (Cholesterol)    Dispense:  90 tablet    Refill:  2  . lidocaine (LIDODERM) 5 %    Sig: Place 1 patch onto the skin daily. Remove & Discard patch within 12 hours or as directed by MD    Dispense:  30 patch    Refill:  0  . Insulin Pen Needle (B-D UF III MINI PEN NEEDLES) 31G X 5 MM MISC    Sig: 1 applicator by Does not apply route 4 (four) times daily.    Dispense:  100 each    Refill:  3  . Continuous Blood Gluc Transmit (DEXCOM G6 TRANSMITTER) MISC    Sig: Inject 1 Device into the skin as directed.    Dispense:  1 each    Refill:  3  . Continuous Blood Gluc Sensor (DEXCOM G6 SENSOR) MISC    Sig: Inject 3 Devices into the skin as directed.    Dispense:  3 each    Refill:  11  . Continuous Blood Gluc Receiver (DEXCOM G6 RECEIVER) DEVI    Sig: 1 kit by Does not apply route as directed.    Dispense:  1 each    Refill:  2 [place] PGY-3, [facility] 09/03/2020 1:21 PM

## 2020-09-03 ENCOUNTER — Encounter: Payer: Self-pay | Admitting: Family Medicine

## 2020-09-03 ENCOUNTER — Ambulatory Visit (INDEPENDENT_AMBULATORY_CARE_PROVIDER_SITE_OTHER): Payer: Medicare Other | Admitting: Family Medicine

## 2020-09-03 ENCOUNTER — Other Ambulatory Visit: Payer: Self-pay

## 2020-09-03 VITALS — BP 158/86 | HR 80 | Wt 241.0 lb

## 2020-09-03 DIAGNOSIS — E785 Hyperlipidemia, unspecified: Secondary | ICD-10-CM

## 2020-09-03 DIAGNOSIS — E118 Type 2 diabetes mellitus with unspecified complications: Secondary | ICD-10-CM | POA: Diagnosis not present

## 2020-09-03 DIAGNOSIS — I10 Essential (primary) hypertension: Secondary | ICD-10-CM | POA: Diagnosis not present

## 2020-09-03 DIAGNOSIS — E1142 Type 2 diabetes mellitus with diabetic polyneuropathy: Secondary | ICD-10-CM | POA: Diagnosis not present

## 2020-09-03 DIAGNOSIS — E1165 Type 2 diabetes mellitus with hyperglycemia: Secondary | ICD-10-CM | POA: Diagnosis not present

## 2020-09-03 DIAGNOSIS — E1169 Type 2 diabetes mellitus with other specified complication: Secondary | ICD-10-CM

## 2020-09-03 DIAGNOSIS — B0229 Other postherpetic nervous system involvement: Secondary | ICD-10-CM

## 2020-09-03 DIAGNOSIS — Z794 Long term (current) use of insulin: Secondary | ICD-10-CM

## 2020-09-03 DIAGNOSIS — IMO0002 Reserved for concepts with insufficient information to code with codable children: Secondary | ICD-10-CM

## 2020-09-03 LAB — POCT GLYCOSYLATED HEMOGLOBIN (HGB A1C): Hemoglobin A1C: 10 % — AB (ref 4.0–5.6)

## 2020-09-03 MED ORDER — EMPAGLIFLOZIN 25 MG PO TABS: 25.0000 mg | ORAL_TABLET | Freq: Every day | ORAL | 3 refills | Status: DC

## 2020-09-03 MED ORDER — GABAPENTIN 300 MG PO CAPS: 900.0000 mg | ORAL_CAPSULE | Freq: Three times a day (TID) | ORAL | 0 refills | Status: DC

## 2020-09-03 MED ORDER — DEXCOM G6 TRANSMITTER MISC: 1.0000 | 3 refills | Status: DC

## 2020-09-03 MED ORDER — LIDOCAINE 5 % EX PTCH: 1.0000 | MEDICATED_PATCH | CUTANEOUS | 0 refills | Status: DC

## 2020-09-03 MED ORDER — BD PEN NEEDLE MINI U/F 31G X 5 MM MISC: 1.0000 | Freq: Four times a day (QID) | 3 refills | Status: DC

## 2020-09-03 MED ORDER — ATORVASTATIN CALCIUM 80 MG PO TABS: 80.0000 mg | ORAL_TABLET | Freq: Every day | ORAL | 2 refills | Status: DC

## 2020-09-03 MED ORDER — DEXCOM G6 SENSOR MISC: 3.0000 | 11 refills | Status: DC

## 2020-09-03 MED ORDER — DEXCOM G6 RECEIVER DEVI: 1.0000 | 2 refills | Status: DC

## 2020-09-03 NOTE — Assessment & Plan Note (Signed)
Chronic. Noncompliant with statin - Education provided - Restart Atorvastatin 80mg  QD - Recheck lipid panel in 77months

## 2020-09-03 NOTE — Assessment & Plan Note (Signed)
>>  ASSESSMENT AND PLAN FOR UNCONTROLLED DIABETES MELLITUS WITH COMPLICATION, WITH LONG-TERM CURRENT USE OF INSULIN WRITTEN ON 09/03/2020  1:11 PM BY MULLIS, KIERSTEN P, DO  Chronic, Uncontrolled. A1C worsened to 10.0. today. Currently monitoring and treating with insulin therapy with >3 injection per day.  [name] evaluated patient during visit. Plan to follow up on Thursday for further evaluate and adjust medications - Restart Jardiacne 25mg  QD - Continue Lantus 65U daily. Insulin needle rx provided. - Continue Novolog 30U BID - Continue Victoza daily. May benefit from transition to qweekly dosing to minimize daily injection - Reorder Dexcom supplies - Follow up with PCP in 1 month for continued monitoring. - Plan for diabetic foot exam at follow up visit.

## 2020-09-03 NOTE — Assessment & Plan Note (Addendum)
Chronic, Uncontrolled. A1C worsened to 10.0. today. Currently monitoring and treating with insulin therapy with >3 injection per day.  [name] evaluated patient during visit. Plan to follow up on Thursday for further evaluate and adjust medications - Restart Jardiacne 25mg  QD - Continue Lantus 65U daily. Insulin needle rx provided. - Continue Novolog 30U BID - Continue Victoza daily. May benefit from transition to qweekly dosing to minimize daily injection - Reorder Dexcom supplies - Follow up with PCP in 1 month for continued monitoring. - Plan for diabetic foot exam at follow up visit.

## 2020-09-03 NOTE — Patient Instructions (Signed)
It was a pleasure to see you today!  Thank you for choosing [facility] for your primary care.   Our plans for today were:  Cholesterol: please restart Atorvastatin 80mg  daily  Blood pressure; Please continue taking Amlodipine 10mg  daily and Lisinopril 40mg  daily  Diabetes: Continue taking Novolog and Lantus as is for now. Victoza once a day. Restart the Jardiance 25mg  daily. Follow up with [name] on Thursday as scheduled.   To keep you healthy, please keep in mind the following health maintenance items that you are due for:   1. Colonoscopy 2. Diabetic foot exam 3. Flu vaccine 4. Pneumonia vaccine    You should return to our clinic in 1 month for diabetes follow up  Best Wishes,   Mina Marble, DO

## 2020-09-03 NOTE — Assessment & Plan Note (Addendum)
Acute. Rash has resolved but symptoms persist with inadequate treatment with steroid cream. comlpeted Vicodin prescritpion. - Increase Gabapentin to 900mg  TID x 1 month, then return to 600mg  TID - Start Lidocaine Patch  - If no improvement with above measures, can evaluate for transition of Gabapentin to Amitriptyline vs antiepileptic medication (valproic acid, carbamazepine, lamotrigine) vs SNRI (duloxetine, venlafaxine)

## 2020-09-03 NOTE — Assessment & Plan Note (Signed)
Chronic. Elevated BP today in setting of not taking BP meds this morning. Will hold off on adjustments at this time. Follow up in 1 month. Patient to be sure to take medications prior to visit. Adjust medications if remains elevated at follow up visit.

## 2020-09-04 ENCOUNTER — Telehealth: Payer: Self-pay

## 2020-09-04 NOTE — Telephone Encounter (Signed)
[facility] PA faxed to [employer] with recent office notes. Original in [employer] folder at my desk in RN room.

## 2020-09-13 ENCOUNTER — Ambulatory Visit (INDEPENDENT_AMBULATORY_CARE_PROVIDER_SITE_OTHER): Payer: Medicare Other | Admitting: Pharmacist

## 2020-09-13 ENCOUNTER — Other Ambulatory Visit: Payer: Self-pay

## 2020-09-13 ENCOUNTER — Encounter: Payer: Self-pay | Admitting: Pharmacist

## 2020-09-13 VITALS — BP 144/84 | HR 83 | Ht 61.0 in | Wt 237.8 lb

## 2020-09-13 DIAGNOSIS — E1165 Type 2 diabetes mellitus with hyperglycemia: Secondary | ICD-10-CM

## 2020-09-13 DIAGNOSIS — E118 Type 2 diabetes mellitus with unspecified complications: Secondary | ICD-10-CM | POA: Diagnosis not present

## 2020-09-13 DIAGNOSIS — IMO0002 Reserved for concepts with insufficient information to code with codable children: Secondary | ICD-10-CM

## 2020-09-13 DIAGNOSIS — Z794 Long term (current) use of insulin: Secondary | ICD-10-CM

## 2020-09-13 MED ORDER — DEXCOM G6 TRANSMITTER MISC: 1.0000 | 3 refills | Status: DC

## 2020-09-13 MED ORDER — LANTUS SOLOSTAR 100 UNIT/ML ~~LOC~~ SOPN: 65.0000 [IU] | PEN_INJECTOR | Freq: Every day | SUBCUTANEOUS | 3 refills | Status: DC

## 2020-09-13 MED ORDER — INSULIN LISPRO (1 UNIT DIAL) 100 UNIT/ML (KWIKPEN): 33.0000 [IU] | PEN_INJECTOR | Freq: Two times a day (BID) | SUBCUTANEOUS | 11 refills | Status: DC

## 2020-09-13 MED ORDER — OZEMPIC (0.25 OR 0.5 MG/DOSE) 2 MG/1.5ML ~~LOC~~ SOPN: PEN_INJECTOR | SUBCUTANEOUS | 0 refills | Status: DC

## 2020-09-13 MED ORDER — DEXCOM G6 SENSOR MISC: 3.0000 | 11 refills | Status: DC

## 2020-09-13 NOTE — Progress Notes (Signed)
S:     Chief Complaint  Patient presents with  . Medication Management    DM/Dexcom   Patient was referred and last seen by Primary Care Provider on 09/03/20 and restarted on Jardiance and continued on other DM medications.    Patient arrives in fair spirits, ambulating with assistance of a cane. Presents for diabetes management and Dexcom G6 application. Reports taking Lantus 63-65 units daily and Victoza 1.8 mg daily in the evenings. Reports taking Novolog 33 units daily in the mornings instead of Humalog 33 units BID w/meals as prescribed. Reports not taking Humalog because she still has 3 pens left of Novolog at home. Reports not restarting Jardiance due to "taking too many medications." Additionally, pt requested a once weekly GLP. Reports not checking BG at home, however last reading checked was 260 mg/dL. Patient also brought Dexcom receiver to clinic today - sensor and transmitter last used in July 2021. Reports largest meal of the day is lunch.   Patient taking >1 gram acetaminophen every 6 hours: No. Patient taking hydroxyrea: No.  Patient reports Diabetes was diagnosed in 2010.   Family/Social History:  -Fhx: Heart failure in Mother; Diabetes in Father -Tobacco use: denies   Insurance coverage/medication affordability: [employer]  Patient denies optimal adherence with medications.  Current diabetes medications include: liraglutide (Victoza) 1.59m daily, insulin glargine (Lantus) 80 units BID (taking 63-65 units once daily), insulin lispro (Humalog) 35 units BID (taking Novolog 33 units once daily), empagliflozin (Jardiance) 159mdaily (reports not taking)  Current hypertension medications include: lisinopril (Zestril) 4040maily, amlodipine (Norvasc) 52m85mily Current hyperlipidemia medications include: atorvastatin (Lipitor) 80mg66mly  Patient denies hypoglycemic events.  Dexcom G6 patient education Person(s)instructed: patient    Instruction: Patient oriented to three components of Dexcom G6 continuous glucose monitor (sensor, transmitter, receiver/cellphone) Receiver or cellphone: receiver -Patient educated that Dexom G6 app must always be running (patient should not close out of app) Sensor code: 5937 [phone] code: [id number] application -- sensor placed on right arm 1. Site selection and site prep with alcohol pad 2. Sensor prep-sensor pack and sensor applicator 3. Sensor applied to area away from waistband, scarring, tattoos, irritation, and bones 4. Transmitter sanitized with alcohol pad and inserted into sensor. 5. Starting the sensor: 2 hour warm up before BG readings available 6. Sensor change every 10 days and rotate site 7. Call Dexcom customer service if sensor comes off before 10 days  Safety and Troubleshooting 1. Do a fingerstick blood glucose test if the sensor readings do not match how    you feel 2. Remove sensor prior to magnetic resonance imaging (MRI), computed tomography (CT) scan, or high-frequency electrical heat (diathermy) treatment. 3. Do not allow sun screen or insect repellant to come into contact with Dexcom G6. These skin care products may lead for the plastic used in the Dexcom G6 to crack. 4. Dexcom G6 may be worn through a walk-Environmental education officermay not be exposed to an advanced Imaging Technology (AIT) body scanner (also called a millimeter wave scanner) or the baggage x-ray machine. Instead, ask for hand-wanding or full-body pat-down and visual inspection.  5. Doses of acetaminophen (Tylenol) >1 gram every 6 hours may cause false high readings. 6. Hydroxyurea (Hydrea, Droxia) may interfere with accuracy of blood glucose readings from Dexcom G6. 7. Store sensor kit between 36 and 86 degrees Farenheit. Can be refrigerated within this temperature range.  O:  Physical Exam Constitutional:  Appearance: Normal appearance.  Neurological:     Mental Status:  She is alert.  Psychiatric:        Mood and Affect: Mood normal.        Behavior: Behavior normal.        Thought Content: Thought content normal.    Review of Systems  All other systems reviewed and are negative.  Lab Results  Component Value Date   HGBA1C 10.0 (A) 09/03/2020   Vitals:   09/13/20 1342  BP: (!) 144/84  Pulse: 83  SpO2: 98%    Lipid Panel     Component Value Date/Time   CHOL 189 07/11/2019 1710   TRIG 139 07/11/2019 1710   HDL 46 07/11/2019 1710   CHOLHDL 4.1 07/11/2019 1710   CHOLHDL 3.3 09/16/2017 0302   VLDL 16 09/16/2017 0302   LDLCALC 118 (H) 07/11/2019 1710   LDLDIRECT 51 03/27/2016 1614    2 hour post-meal/random blood sugars: 260 mg/dL  Clinical Atherosclerotic Cardiovascular Disease (ASCVD): No  The 10-year ASCVD risk score Mikey Bussing DC Jr., et al., 2013) is: 26.2%   Values used to calculate the score:     Age: 42 years     Sex: Female     Is Non-Hispanic African American: Yes     Diabetic: Yes     Tobacco smoker: No     Systolic Blood Pressure: 683 mmHg     Is BP treated: Yes     HDL Cholesterol: 46 mg/dL     Total Cholesterol: 189 mg/dL    A/P: Diabetes longstanding and currently uncontrolled. Control is suboptimal due to medication non-adherence. Dexcom G6 placed on patient's right arm successfully. Encouraged patient to start taking Humalog twice daily with breakfast and lunch to cover meal sugars. Will switch from daily Victoza to once weekly Ozempic to improve medication adherence. Instructed patient to continue holding Jardiance with plans to restart once current regimen is stable. Patient verbalized understanding.  -Started GLP-1 Ozempic (generic name semaglutide) 0.25 mg weekly x2 weeks, then increase to 0.5 mg weekly x3 weeks -Discontinued GLP-1 Victoza (generic name liraglutide) -Held SGLT2-I Jardiance (generic name empagliflozin) - Reconsider in the future -Continued basal insulin Lantus (insulin glargine) 65 units  daily. -Continued rapid insulin Humalog (insulin lispro) 33 units BID w/breakfast and lunch.  -Sent prescription for Dexcom sensor and transmitter to [facility] -Extensively discussed pathophysiology of diabetes, recommended lifestyle interventions, dietary effects on blood sugar control -Counseled on s/sx of and management of hypoglycemia -Next A1C anticipated April 2022.   Medication Samples have been provided to the patient. Drug name: Ozempic       Strength: 0.25 mg         Qty: 1  LOT: [id number]  Exp.Date: 09/03/22  Dosing instructions: Inject 0.25 mg weekly for 2 weeks, then increase to 0.5 mg weekly for 3 weeks   The patient has been instructed regarding the correct time, dose, and frequency of taking this medication, including desired effects and most common side effects.   ASCVD risk  ASCVD risk - primary prevention in patient with diabetes. Last LDL is not controlled. ASCVD risk score is >20%  - high intensity statin indicated. -Continued atorvastatin 80 mg daily .   Written patient instructions provided.  Total time in face to face counseling 45 minutes.   Follow up Pharmacist Clinic Visit in 2 weeks.   Patient seen with Toma Aran, PharmD Candidate and Lorel Monaco, PharmD, BCPS - PGY2 Pharmacy Resident.

## 2020-09-13 NOTE — Patient Instructions (Addendum)
It was nice to see you today!  Your goal blood sugar is 80-130 before eating and less than 180 after eating.  Medication Changes: Once your Novolog pens are finished, start using Humalog - you will take 33 units twice daily with breakfast and lunch  Start taking Ozempic 0.25 mg weekly for 2 weeks, then increase to 0.5 mg weekly for 3 weeks  Stop Victoza  Continue taking Lantus 65 units once daily  Hold Jardiance for now   Monitor blood sugars at home and keep a log (glucometer or piece of paper) to bring with you to your next visit.  Keep up the good work with diet and exercise. Aim for a diet full of vegetables, fruit and lean meats (chicken, [place], fish). Try to limit salt intake by eating fresh or frozen vegetables (instead of canned), rinse canned vegetables prior to cooking and do not add any additional salt to meals.

## 2020-09-14 NOTE — Assessment & Plan Note (Signed)
>>  ASSESSMENT AND PLAN FOR UNCONTROLLED DIABETES MELLITUS WITH COMPLICATION, WITH LONG-TERM CURRENT USE OF INSULIN WRITTEN ON 09/14/2020  8:31 AM BY KOVAL, PETER G, RPH-CPP  Diabetes longstanding and currently uncontrolled. Control is suboptimal due to medication non-adherence. Dexcom G6 placed on patient's right arm successfully. Encouraged patient to start taking Humalog twice daily with breakfast and lunch to cover meal sugars. Will switch from daily Victoza to once weekly Ozempic to improve medication adherence. Instructed patient to continue holding Jardiance with plans to restart once current regimen is stable. Patient verbalized understanding.  -Started GLP-1 Ozempic (generic name semaglutide) 0.25 mg weekly x2 weeks, then increase to 0.5 mg weekly x3 weeks -Discontinued GLP-1 Victoza (generic name liraglutide) -Held SGLT2-I Jardiance (generic name empagliflozin) - Reconsider in the future -Continued basal insulin Lantus (insulin glargine) 65 units daily. -Continued rapid insulin Humalog (insulin lispro) 33 units BID w/breakfast and lunch.  -Sent prescription for Dexcom sensor and transmitter to [facility]

## 2020-09-14 NOTE — Assessment & Plan Note (Signed)
Diabetes longstanding and currently uncontrolled. Control is suboptimal due to medication non-adherence. Dexcom G6 placed on patient's right arm successfully. Encouraged patient to start taking Humalog twice daily with breakfast and lunch to cover meal sugars. Will switch from daily Victoza to once weekly Ozempic to improve medication adherence. Instructed patient to continue holding Jardiance with plans to restart once current regimen is stable. Patient verbalized understanding.  -Started GLP-1 Ozempic (generic name semaglutide) 0.25 mg weekly x2 weeks, then increase to 0.5 mg weekly x3 weeks -Discontinued GLP-1 Victoza (generic name liraglutide) -Held SGLT2-I Jardiance (generic name empagliflozin) - Reconsider in the future -Continued basal insulin Lantus (insulin glargine) 65 units daily. -Continued rapid insulin Humalog (insulin lispro) 33 units BID w/breakfast and lunch.  -Sent prescription for Dexcom sensor and transmitter to [facility]

## 2020-09-14 NOTE — Progress Notes (Signed)
Reviewed: I agree with Dr. Koval's documentation and management. 

## 2020-09-18 ENCOUNTER — Telehealth: Payer: Self-pay

## 2020-09-18 NOTE — Telephone Encounter (Signed)
Patient LVM on nurse line reporting her "dexcom" fell off in the shower. Patient reports she does not know what to do now. I called patient back to discuss. Patient informed me she is still unable to get supplies even though I submitted paperwork on 2/1. I will call [employer] and her insurance to see what else needs to be done. In the meantime is patient to contact manufacturer for another sensor or come back to [facility] for one? Will forward to [place].

## 2020-09-19 NOTE — Telephone Encounter (Signed)
Attempted to contact patient to discuss her coming in to clinic to for application of new Dexcom. Patient did not answer and left voicemail requesting call back.

## 2020-09-20 ENCOUNTER — Other Ambulatory Visit: Payer: Self-pay

## 2020-09-20 ENCOUNTER — Ambulatory Visit (INDEPENDENT_AMBULATORY_CARE_PROVIDER_SITE_OTHER): Payer: Medicare Other | Admitting: Pharmacist

## 2020-09-20 DIAGNOSIS — E118 Type 2 diabetes mellitus with unspecified complications: Secondary | ICD-10-CM | POA: Diagnosis not present

## 2020-09-20 DIAGNOSIS — Z794 Long term (current) use of insulin: Secondary | ICD-10-CM

## 2020-09-20 DIAGNOSIS — E1165 Type 2 diabetes mellitus with hyperglycemia: Secondary | ICD-10-CM

## 2020-09-20 DIAGNOSIS — IMO0002 Reserved for concepts with insufficient information to code with codable children: Secondary | ICD-10-CM

## 2020-09-20 MED ORDER — LANTUS SOLOSTAR 100 UNIT/ML ~~LOC~~ SOPN: 60.0000 [IU] | PEN_INJECTOR | Freq: Every day | SUBCUTANEOUS | 3 refills | Status: DC

## 2020-09-20 NOTE — Telephone Encounter (Signed)
Spoke with patient in previous encounter (2/15) about not getting her Dexcom supplies.   I have resubmitted the PA through covermymeds.  Key: [id number]- Transmitter Key: [place]

## 2020-09-20 NOTE — Assessment & Plan Note (Signed)
Diabetes longstanding currently uncontrolled. Patient is able to verbalize appropriate hypoglycemia management plan. Patient appears fairly adherent with medications. Pt experienced 2 epsiodes symptomatic hypoglycemia. Will reduce basal insulin requirement and instructed patient to take Novolog 33 units twice daily as previously discussed instead of 35 units BID. Patient verbalized understanding. Dexcom G6 placed on patient's right arm successfully. -Decreased dose of basal insulin Lantus (insulin glardine) from 65 units to 60 units daily.  -Continued rapid insulin Humalog (insulin lispro) 33 units BID w/breakfast and lunch. (Okay to use Novolog 33 units BID until supply runs out) -Continued GLP-1 Ozempic (semaglutide) to 0.25 mg weekly for 1 more week, then increase to 0.5 mg weekly x3 weeks -Held SGLT2-I Jardiance (empagliflozin) - Reconsider in the future

## 2020-09-20 NOTE — Assessment & Plan Note (Signed)
>>  ASSESSMENT AND PLAN FOR UNCONTROLLED DIABETES MELLITUS WITH COMPLICATION, WITH LONG-TERM CURRENT USE OF INSULIN WRITTEN ON 09/20/2020  3:23 PM BY KOVAL, PETER G, RPH-CPP  Diabetes longstanding currently uncontrolled. Patient is able to verbalize appropriate hypoglycemia management plan. Patient appears fairly adherent with medications. Pt experienced 2 epsiodes symptomatic hypoglycemia. Will reduce basal insulin requirement and instructed patient to take Novolog 33 units twice daily as previously discussed instead of 35 units BID. Patient verbalized understanding. Dexcom G6 placed on patient's right arm successfully. -Decreased dose of basal insulin Lantus (insulin glardine) from 65 units to 60 units daily.  -Continued rapid insulin Humalog (insulin lispro) 33 units BID w/breakfast and lunch. (Okay to use Novolog 33 units BID until supply runs out) -Continued GLP-1 Ozempic (semaglutide) to 0.25 mg weekly for 1 more week, then increase to 0.5 mg weekly x3 weeks -Held SGLT2-I Jardiance (empagliflozin) - Reconsider in the future

## 2020-09-20 NOTE — Telephone Encounter (Signed)
Spoke to patient and she will come in today at 2:15PM for Dexcom sensor replacement

## 2020-09-20 NOTE — Progress Notes (Signed)
Reviewed: I agree with Dr. Koval's documentation and management. 

## 2020-09-20 NOTE — Patient Instructions (Signed)
Nice seeing you today!  Medication Changes: 1. Start taking Lantus 60 units daily  2. Start taking Novolog 33 units twice daily with meals. Once you finish Novolog, you can start taking Humalog 33 units twice daily with meals  3. Continue Ozempic 0.25 mg weekly.

## 2020-09-20 NOTE — Progress Notes (Addendum)
S:     Chief Complaint  Patient presents with  . Medication Management    Dexcom sensor replacement    Patient arrives in good spirits. Presents for Dexcom G6 application. Patient was referred and last seen by Primary Care Provider on 09/04/19. Last seen by pharmacy on 09/13/20.  Today, patient requesting new sensor as old sensor fell off while in the shower. Reports taking Lantus 65 units daily and Novolog 35 units twice daily w/meals instead of Humalog 33 units BID as prescribed. Reports not taking Humalog because she still has 3 pens left of Novolog at home. Reports tolerating Ozempic (semaglutide) 0.25 mg weekly (Fridays) with no GI complaints. Reports two symptomatic lows - 49 & 50s  (random afternoon and while sleeping).   Patient taking >1 gram acetaminophen every 6 hours: No Patient taking hydroxyrea: No  Patient reports Diabetes was diagnosed in 2010.   Family/Social History:  -Fhx: Heart failure in Mother; Diabetes in Father -Tobacco use: denies   Insurance coverage/medication affordability: [employer]  Patient reports adherence with medications.  Current diabetes medications include: liraglutide (Victoza) 1.85m daily, insulin glargine (Lantus) 65 units daily, insulin lispro (Humalog) 33 units BID (taking Novolog 35 units once daily), empagliflozin (Jardiance) 177mdaily (Held)  Current hypertension medications include: lisinopril (Zestril) 4080maily, amlodipine (Norvasc) 104m59mily Current hyperlipidemia medications include: atorvastatin (Lipitor) 80mg43mly  Patient reports hypoglycemic events.   Patient denies nocturia (nighttime urination).  Patient denies neuropathy (nerve pain). Patient denies visual changes.   Dexcom G6 patient education Person(s)instructed: patient  Instruction: Patient oriented to three components of Dexcom G6 continuous glucose monitor (sensor, transmitter, receiver/cellphone) Receiver or cellphone: receiver   -Patient educated that Dexom G6 app must always be running (patient should not close out of app) Sensor code: 5937 [phone] code: [id number] application -- sensor placed on right arm 1. Site selection and site prep with alcohol pad 2. Sensor prep-sensor pack and sensor applicator 3. Sensor applied to area away from waistband, scarring, tattoos, irritation, and bones 4. Transmitter sanitized with alcohol pad and inserted into sensor. 5. Starting the sensor: 2 hour warm up before BG readings available 6. Sensor change every 10 days and rotate site 7. Call Dexcom customer service if sensor comes off before 10 days  Safety and Troubleshooting 1. Do a fingerstick blood glucose test if the sensor readings do not match how    you feel 2. Remove sensor prior to magnetic resonance imaging (MRI), computed tomography (CT) scan, or high-frequency electrical heat (diathermy) treatment. 3. Do not allow sun screen or insect repellant to come into contact with Dexcom G6. These skin care products may lead for the plastic used in the Dexcom G6 to crack. 4. Dexcom G6 may be worn through a walk-Environmental education officermay not be exposed to an advanced Imaging Technology (AIT) body scanner (also called a millimeter wave scanner) or the baggage x-ray machine. Instead, ask for hand-wanding or full-body pat-down and visual inspection.  5. Doses of acetaminophen (Tylenol) >1 gram every 6 hours may cause false high readings. 6. Hydroxyurea (Hydrea, Droxia) may interfere with accuracy of blood glucose readings from Dexcom G6. 7. Store sensor kit between 36 and 86 degrees Farenheit. Can be refrigerated within this temperature range.  Contact information provided for [facility] service and/or trainer.  O:  Physical Exam Constitutional:      Appearance: Normal appearance. She is obese.  Neurological:     General: No focal deficit present.  Mental Status: She is alert.  Psychiatric:        Mood  and Affect: Mood normal.        Behavior: Behavior normal.        Thought Content: Thought content normal.    Review of Systems  All other systems reviewed and are negative.  Lab Results  Component Value Date   HGBA1C 10.0 (A) 09/03/2020   There were no vitals filed for this visit.  Lipid Panel     Component Value Date/Time   CHOL 189 07/11/2019 1710   TRIG 139 07/11/2019 1710   HDL 46 07/11/2019 1710   CHOLHDL 4.1 07/11/2019 1710   CHOLHDL 3.3 09/16/2017 0302   VLDL 16 09/16/2017 0302   LDLCALC 118 (H) 07/11/2019 1710   LDLDIRECT 51 03/27/2016 1614    Home fasting blood sugars: none 2 hour post-meal/random blood sugars: none  Clinical Atherosclerotic Cardiovascular Disease (ASCVD): No  The 10-year ASCVD risk score Mikey Bussing DC Jr., et al., 2013) is: 26.2%   Values used to calculate the score:     Age: 65 years     Sex: Female     Is Non-Hispanic African American: Yes     Diabetic: Yes     Tobacco smoker: No     Systolic Blood Pressure: 373 mmHg     Is BP treated: Yes     HDL Cholesterol: 46 mg/dL     Total Cholesterol: 189 mg/dL    A/P: Diabetes longstanding currently uncontrolled. Patient is able to verbalize appropriate hypoglycemia management plan. Patient appears fairly adherent with medications. Pt experienced 2 epsiodes symptomatic hypoglycemia. Will reduce basal insulin requirement and instructed patient to take Novolog 33 units twice daily as previously discussed instead of 35 units BID. Patient verbalized understanding. Additionally, pt is a good candidate for a CGM device given history of hypoglycemia and using 3 insulin injections per day. Provided patient with Dexcom G6 sample and placed on patient's right arm successfully. -Decreased dose of basal insulin Lantus (insulin glardine) from 65 units to 60 units daily.  -Continued rapid insulin Humalog (insulin lispro) 33 units BID w/breakfast and lunch. (Okay to use Novolog 33 units BID until supply runs  out) -Continued GLP-1 Ozempic (semaglutide) to 0.25 mg weekly for 1 more week, then increase to 0.5 mg weekly x3 weeks -Held SGLT2-I Jardiance (empagliflozin) - Reconsider in the future -Extensively discussed pathophysiology of diabetes, recommended lifestyle interventions, dietary effects on blood sugar control -Counseled on s/sx of and management of hypoglycemia -Next A1C anticipated April 2022.   ASCVD risk  ASCVD risk - primary prevention in patient with diabetes. Last LDL is not controlled. ASCVD risk score is >20%  - high intensity statin indicated. -Continued atorvastatin 80 mg daily  Written patient instructions provided. Total time in face to face counseling 25 minutes.   Follow up Pharmacist Clinic Visit in 1 week.   Patient seen with Toma Aran, PharmD Candidate, Wilson Singer, PharmD - PGY-1 Resident, and Lorel Monaco, PharmD, BCPS - PGY2 Pharmacy Resident.

## 2020-09-27 ENCOUNTER — Other Ambulatory Visit: Payer: Self-pay

## 2020-09-27 ENCOUNTER — Ambulatory Visit: Payer: Medicare Other | Admitting: Family Medicine

## 2020-09-27 ENCOUNTER — Encounter: Payer: Self-pay | Admitting: Pharmacist

## 2020-09-27 ENCOUNTER — Ambulatory Visit (INDEPENDENT_AMBULATORY_CARE_PROVIDER_SITE_OTHER): Payer: Medicare Other | Admitting: Pharmacist

## 2020-09-27 VITALS — BP 142/90 | HR 88

## 2020-09-27 DIAGNOSIS — Z794 Long term (current) use of insulin: Secondary | ICD-10-CM

## 2020-09-27 DIAGNOSIS — E1165 Type 2 diabetes mellitus with hyperglycemia: Secondary | ICD-10-CM

## 2020-09-27 DIAGNOSIS — E118 Type 2 diabetes mellitus with unspecified complications: Secondary | ICD-10-CM

## 2020-09-27 DIAGNOSIS — IMO0002 Reserved for concepts with insufficient information to code with codable children: Secondary | ICD-10-CM

## 2020-09-27 MED ORDER — OZEMPIC (0.25 OR 0.5 MG/DOSE) 2 MG/1.5ML ~~LOC~~ SOPN: PEN_INJECTOR | SUBCUTANEOUS | 0 refills | Status: DC

## 2020-09-27 MED ORDER — INSULIN LISPRO (1 UNIT DIAL) 100 UNIT/ML (KWIKPEN): 25.0000 [IU] | PEN_INJECTOR | Freq: Every day | SUBCUTANEOUS | 11 refills | Status: DC

## 2020-09-27 MED ORDER — LANTUS SOLOSTAR 100 UNIT/ML ~~LOC~~ SOPN: 25.0000 [IU] | PEN_INJECTOR | Freq: Every day | SUBCUTANEOUS | 3 refills | Status: DC

## 2020-09-27 NOTE — Telephone Encounter (Signed)
We should get more detailed information on why via fax.

## 2020-09-27 NOTE — Assessment & Plan Note (Signed)
Diabetes longstanding currently uncontrolled. Patient is able to verbalize appropriate hypoglycemia management plan. Medication adherence poor due to history/fear of hypoglycemia. Insulin requirement has significantly reduced with recent addition of GLP-1 agonist. Additionally, provided Dexcom G6 sensor sample as PA is pending.  -Decreased dose of basal insulin Lantus (insulin glardine) from 60 units to 25 units daily.  -Decreased rapid insulin Humalog(insulin lispro) to 25 units daily w/largest meal.(Okay to use Novolog 25 units dailyuntil supply runs out) -Increased GLP-1 Ozempic (semaglutide) to 0.5 mg weekly -HeldSGLT2-IJardiance(empagliflozin)- Reconsider in the future -Extensively discussed pathophysiology of diabetes, recommended lifestyle interventions, dietary effects on blood sugar control -Counseled on s/sx of and management of hypoglycemia -Next A1C anticipated April 2022.

## 2020-09-27 NOTE — Assessment & Plan Note (Signed)
>>  ASSESSMENT AND PLAN FOR UNCONTROLLED DIABETES MELLITUS WITH COMPLICATION, WITH LONG-TERM CURRENT USE OF INSULIN WRITTEN ON 09/27/2020  4:56 PM BY KOVAL, PETER G, RPH-CPP  Diabetes longstanding currently uncontrolled. Patient is able to verbalize appropriate hypoglycemia management plan. Medication adherence poor due to history/fear of hypoglycemia. Insulin requirement has significantly reduced with recent addition of GLP-1 agonist. Additionally, provided Dexcom G6 sensor sample as PA is pending.  -Decreased dose of basal insulin Lantus (insulin glardine) from 60 units to 25 units daily.  -Decreased rapid insulin Humalog(insulin lispro) to 25 units daily w/largest meal.(Okay to use Novolog 25 units dailyuntil supply runs out) -Increased GLP-1 Ozempic (semaglutide) to 0.5 mg weekly -HeldSGLT2-IJardiance(empagliflozin)- Reconsider in the future -Extensively discussed pathophysiology of diabetes, recommended lifestyle interventions, dietary effects on blood sugar control -Counseled on s/sx of and management of hypoglycemia -Next A1C anticipated April 2022.

## 2020-09-27 NOTE — Progress Notes (Signed)
S:     Chief Complaint  Patient presents with  . Medication Management    Diabetes/Dexcom    Patient arrives in good spirits. Presents for diabetes evaluation, education, and management Patient was referred and last seen by Primary Care Provider on 09/03/20. Last seen by pharmacy on 09/13/20 and Lantus was reduced from 65 units to 60 units daily due two hypoglycemic events.   Today, patient reports self-reducing Lantus from 60 units to 32 units daily due to fear of hypoglycemia and reports taking Novolog 33 units once daily. Reports medication adherence and no side effects with Ozempic (semaglutide) 0.25 mg weekly. Reports BG range 130-250 (BG more in the 100s). Denies hypoglycemia and BG <100.   Patient reports Diabetes was diagnosed in 2010.   Family/Social History: -ZHY:QMVHQ failureinMother;DiabetesinFather -Tobacco use: denies  Insurance coverage/medication affordability:[employer] Medicare  Current diabetes medications include: semaglutide (Ozempic) 0.25mg  daily, insulin glargine (Lantus) 65 units daily (taking 32 units daily), insulin lispro (Humalog) 33 units BID (takingNovolog33 units once daily), empagliflozin (Jardiance) 10mg  daily (Held) Current hypertension medications include:lisinopril (Zestril) 40mg  daily, amlodipine (Norvasc) 10mg  daily Current hyperlipidemia medications include:atorvastatin (Lipitor) 80mg  daily  Patient denies hypoglycemic events.   O:  Physical Exam Vitals reviewed.  Constitutional:      Appearance: Normal appearance. She is obese.  Neurological:     General: No focal deficit present.     Mental Status: She is alert and oriented to person, place, and time.  Psychiatric:        Mood and Affect: Mood normal.        Behavior: Behavior normal.        Thought Content: Thought content normal.    Review of Systems  All other systems reviewed and are negative.    Lab Results  Component Value Date   HGBA1C 10.0 (A)  09/03/2020   Vitals:   09/27/20 1419  BP: (!) 142/90  Pulse: 88  SpO2: 98%    Lipid Panel     Component Value Date/Time   CHOL 189 07/11/2019 1710   TRIG 139 07/11/2019 1710   HDL 46 07/11/2019 1710   CHOLHDL 4.1 07/11/2019 1710   CHOLHDL 3.3 09/16/2017 0302   VLDL 16 09/16/2017 0302   LDLCALC 118 (H) 07/11/2019 1710   LDLDIRECT 51 03/27/2016 1614    Home blood sugars: 100-250s  Clinical Atherosclerotic Cardiovascular Disease (ASCVD): No  The 10-year ASCVD risk score Mikey Bussing DC Jr., et al., 2013) is: 25.4%   Values used to calculate the score:     Age: 72 years     Sex: Female     Is Non-Hispanic African American: Yes     Diabetic: Yes     Tobacco smoker: No     Systolic Blood Pressure: 469 mmHg     Is BP treated: Yes     HDL Cholesterol: 46 mg/dL     Total Cholesterol: 189 mg/dL    A/P: Diabetes longstanding currently uncontrolled. Patient is able to verbalize appropriate hypoglycemia management plan. Medication adherence poor due to history/fear of hypoglycemia. Insulin requirement has significantly reduced with recent addition of GLP-1 agonist. Additionally, provided Dexcom G6 sensor sample as PA is pending.  -Decreased dose of basal insulin Lantus (insulin glardine) from 60 units to 25 units daily.  -Decreased rapid insulin Humalog(insulin lispro) to 25 units daily w/largest meal.(Okay to use Novolog 25 units dailyuntil supply runs out) -Increased GLP-1 Ozempic (semaglutide) to 0.5 mg weekly -HeldSGLT2-IJardiance(empagliflozin)- Reconsider in the future -Extensively discussed pathophysiology of  diabetes, recommended lifestyle interventions, dietary effects on blood sugar control -Counseled on s/sx of and management of hypoglycemia -Next A1C anticipated April 2022.   Medication Samples have been provided to the patient.  Drug name: Ozempic       Strength: 0.5mg         Qty: 1  LOT: [id number]  Exp.Date: 09/03/22  Dosing instructions: Inject 0.5 mg weekly  The  patient has been instructed regarding the correct time, dose, and frequency of taking this medication, including desired effects and most common side effects.   ASCVD risk  ASCVD risk - primary prevention in patient with diabetes. Last LDLis notcontrolled. ASCVD risk score is>20% - highintensity statin indicated. -Continuedatorvastatin 80mg daily  Written patient instructions provided.  Total time in face to face counseling 25 minutes.     Follow up Pharmacist Clinic Visit in 4 weeks.   Patient seen with Toma Aran, PharmD Candidate, Wilson Singer, PharmD - PGY-1 Resident, and Lorel Monaco, PharmD, BCPS - PGY2 Pharmacy Resident.

## 2020-09-27 NOTE — Patient Instructions (Signed)
It was nice to see you today!  Your goal blood sugar is 80-130 before eating and less than 180 after eating.  Medication Changes: Begin taking Lantus 25 units daily  Begin taking Novolog 25 units daily with your largest meal  Begin taking Ozempic 0.5 mg weekly  Monitor blood sugars at home and keep a log (glucometer or piece of paper) to bring with you to your next visit.  Keep up the good work with diet and exercise. Aim for a diet full of vegetables, fruit and lean meats (chicken, [place], fish). Try to limit salt intake by eating fresh or frozen vegetables (instead of canned), rinse canned vegetables prior to cooking and do not add any additional salt to meals.

## 2020-09-28 NOTE — Progress Notes (Signed)
Reviewed: I agree with Dr. Koval's documentation and management. 

## 2020-09-29 NOTE — Progress Notes (Deleted)
   Subjective:   Patient ID: [name]    DOB: 01-24-1956, 65 y.o. female   MRN: [id number]  [name] is a 65 y.o. female with a history of *** here for ***  Diabetes: Last three A1C's below. Currently on Lantus 25U daily, Humalog 25U w/largest, Ozempic 0.5mg  qweekly. Vania Rea has been help per Pharmacy recs. Endorses compliance. Notes CBGs range ***. Denies any hypoglycemia. Denies any polyuria, polydipsia, polyphagia. Due for ***.  Dexcom: document in plan "A1C ***. Currently monitoring and taking >3 injection per day". Initial RX send message to RN team. Refills - just send to pharmacy   - Continuous Blood Gluc Receiver (DEXCOM G6 RECEIVER) DEVI  - Continuous Blood Gluc Sensor (DEXCOM G6 SENSOR) MISC  - Continuous Blood Gluc Transmit (DEXCOM G6 TRANSMITTER) MISC Lantus/Novolug/Humalog Kwikpens/GLP-1 pens - still need to order BD Insulin Pen Needle (B-D UF III MINI PEN NEEDLES) 31G X 5 MM MISC  Lab Results  Component Value Date   HGBA1C 10.0 (A) 09/03/2020   HGBA1C 9.6 (A) 01/26/2020   HGBA1C 9.6 (A) 10/21/2019    HTN:    today. Currently on Amlodipine 10mg  QD, Lisinopril 40mg  QD. Endorses compliance. Non-smoker. Denies any chest pain, SOB, vision changes, or headaches.   Lab Results  Component Value Date   CREATININE 0.80 06/20/2020   CREATININE 1.03 (H) 06/20/2020   CREATININE 1.07 (H) 10/21/2019     HLD: Last lipid panel below. Currently on Atorvastatin 80mg  QD. Endorses compliance. Denies any muscles aches or weakness. The 10-year ASCVD risk score Mikey Bussing DC Jr., et al., 2013) is: 25.4%   Lab Results  Component Value Date   CHOL 189 07/11/2019   HDL 46 07/11/2019   LDLCALC 118 (H) 07/11/2019   LDLDIRECT 51 03/27/2016   TRIG 139 07/11/2019   CHOLHDL 4.1 07/11/2019    Review of Systems:  Per HPI.   Objective:   There were no vitals taken for this visit. Vitals and nursing note reviewed.  General: pleasant ***, sitting comfortably in exam chair, well  nourished, well developed, in no acute distress with non-toxic appearance HEENT: normocephalic, atraumatic, moist mucous membranes, oropharynx clear without erythema or exudate, TM normal bilaterally  Neck: supple, non-tender without lymphadenopathy CV: regular rate and rhythm without murmurs, rubs, or gallops, no lower extremity edema, 2+ radial and pedal pulses bilaterally Lungs: clear to auscultation bilaterally with normal work of breathing on room air Resp: breathing comfortably on room air, speaking in full sentences Abdomen: soft, non-tender, non-distended, no masses or organomegaly palpable, normoactive bowel sounds Skin: warm, dry, no rashes or lesions Extremities: warm and well perfused, normal tone MSK: ROM grossly intact, strength intact, gait normal Neuro: Alert and oriented, speech normal  Assessment & Plan:   No problem-specific Assessment & Plan notes found for this encounter.  No orders of the defined types were placed in this encounter.  No orders of the defined types were placed in this encounter.  Mina Marble, DO PGY-3, [facility] 09/29/2020 10:52 PM

## 2020-10-01 ENCOUNTER — Ambulatory Visit (INDEPENDENT_AMBULATORY_CARE_PROVIDER_SITE_OTHER): Payer: Medicare Other | Admitting: Family Medicine

## 2020-10-01 DIAGNOSIS — Z5329 Procedure and treatment not carried out because of patient's decision for other reasons: Secondary | ICD-10-CM

## 2020-10-01 NOTE — Telephone Encounter (Signed)
Any more information regarding her inability to get the [facility]?

## 2020-10-02 ENCOUNTER — Telehealth: Payer: Self-pay

## 2020-10-02 NOTE — Telephone Encounter (Signed)
Pharmacy informed me they have updated patients office note. I have faxed new information to [employer] in hopes for approval.

## 2020-10-02 NOTE — Telephone Encounter (Signed)
Dexcom PA for supplies faxed to [employer] with supporting documentation. The original is at my desk in RN room for reference.

## 2020-10-12 NOTE — Telephone Encounter (Signed)
I spoke with patient and she has not received her Dexcom supplies from [employer]. I am assuming the supplies were denied by them, as her insurance also denied the supplies back in February via covermymeds. I have placed the insurance form in pharmacy box to see if they can add some things to help her get this approved. Please return back to me or have PCP fill out if more appropriate.

## 2020-11-02 ENCOUNTER — Other Ambulatory Visit: Payer: Self-pay | Admitting: Family Medicine

## 2020-11-02 DIAGNOSIS — IMO0002 Reserved for concepts with insufficient information to code with codable children: Secondary | ICD-10-CM

## 2020-11-02 DIAGNOSIS — E1165 Type 2 diabetes mellitus with hyperglycemia: Secondary | ICD-10-CM

## 2020-11-02 NOTE — Telephone Encounter (Signed)
Pharmacy calls nurse line regarding insulin aspart rx. Insurance prefers humalog rx.   Please advise if change is appropriate. If so, please send new rx to pharmacy.   Talbot Grumbling, RN

## 2020-11-04 ENCOUNTER — Other Ambulatory Visit: Payer: Self-pay | Admitting: Family Medicine

## 2020-11-04 DIAGNOSIS — IMO0002 Reserved for concepts with insufficient information to code with codable children: Secondary | ICD-10-CM

## 2020-11-04 DIAGNOSIS — E1165 Type 2 diabetes mellitus with hyperglycemia: Secondary | ICD-10-CM

## 2020-11-04 MED ORDER — INSULIN LISPRO (1 UNIT DIAL) 100 UNIT/ML (KWIKPEN): 25.0000 [IU] | PEN_INJECTOR | Freq: Every day | SUBCUTANEOUS | 11 refills | Status: DC

## 2020-11-04 NOTE — Telephone Encounter (Signed)
Humalog Rx has been sent to pharmacy per request. Thank you.

## 2020-11-12 ENCOUNTER — Other Ambulatory Visit: Payer: Self-pay

## 2020-11-13 MED ORDER — RIVAROXABAN 20 MG PO TABS
20.0000 mg | ORAL_TABLET | Freq: Every day | ORAL | 3 refills | Status: DC
Start: 2020-11-13 — End: 2021-02-14

## 2020-12-27 ENCOUNTER — Other Ambulatory Visit: Payer: Self-pay | Admitting: Pharmacist

## 2020-12-27 ENCOUNTER — Other Ambulatory Visit: Payer: Self-pay | Admitting: Family Medicine

## 2020-12-27 DIAGNOSIS — I10 Essential (primary) hypertension: Secondary | ICD-10-CM

## 2020-12-27 DIAGNOSIS — IMO0002 Reserved for concepts with insufficient information to code with codable children: Secondary | ICD-10-CM

## 2020-12-27 DIAGNOSIS — E1165 Type 2 diabetes mellitus with hyperglycemia: Secondary | ICD-10-CM

## 2020-12-27 NOTE — Telephone Encounter (Signed)
Patient needs follow up for HTN management. Missed appt in February. Please schedule with Dr. Tarry Kos

## 2021-01-01 NOTE — Telephone Encounter (Signed)
Called and scheduled patient for 01/10/21

## 2021-01-06 NOTE — Progress Notes (Signed)
Subjective:   Patient ID: [name]    DOB: 07-17-1956, 65 y.o. female   MRN: [id number]  [name] is a 65 y.o. female with a history of A. fib, hypertension, mild OSA, type 2 diabetes with neuropathy, hyperlipidemia, CKD 2, depression here for diabetes and hypertension follow-up  Diabetes: Last three A1C's below. Currently taking Lantus 60 units daily and Novalog 30-35 units daily with biggest meal. Supposed to be taking Lantus 25U and Novolog 35U with largest meal. Also supposed to be taking Ozempic 0.5 mg q. Weekly. Was on Jardiance but this had been held by pharmacy team. Endorses compliance. Doesn't check blood sugars. Just goes off of how she feels. If she doesn't feel jittery or bad then she takes her insulin. Only occasionally gets jittery. Denies any polyuria, polydipsia, polyphagia. Due for diabetic foot exam, pneumonia vaccine.  Lab Results  Component Value Date   HGBA1C 9.6 (A) 01/10/2021   HGBA1C 10.0 (A) 09/03/2020   HGBA1C 9.6 (A) 01/26/2020    HTN:  BP: (!) 149/96 today. Currently on lisinopril 40 mg daily, amlodipine 10 mg daily, and Indapamide 2.5mg  QD. Endorses compliance. Non-smoker. Denies any chest pain, SOB, vision changes, or headaches.   Lab Results  Component Value Date   CREATININE 1.12 (H) 01/10/2021   CREATININE 0.80 06/20/2020   CREATININE 1.03 (H) 06/20/2020    HLD: Last lipid panel below. Currently on Lipitor 80 mg daily. Endorses compliance. Denies any muscles aches or weakness.   Lab Results  Component Value Date   CHOL 176 01/10/2021   HDL 40 01/10/2021   LDLCALC 96 01/10/2021   LDLDIRECT 51 03/27/2016   TRIG 234 (H) 01/10/2021   CHOLHDL 4.4 01/10/2021    Review of Systems:  Per HPI.   Objective:   BP (!) 149/96   Pulse 94   Wt 234 lb 12.8 oz (106.5 kg)   SpO2 100%   BMI 44.37 kg/m  Vitals and nursing note reviewed.  General: pleasant older woman, sitting comfortably in exam chair, well nourished, well developed, in  no acute distress with non-toxic appearance CV: regular rate and rhythm without murmurs, rubs, or gallops, no lower extremity edema, 2+ pedal pulses bilaterally Lungs: clear to auscultation bilaterally with normal work of breathing on room air, speaking in full sentences Skin: warm, dry MSK: uses cane at baseline Neuro: Alert and oriented, speech normal  Diabetic foot exam was performed with the following findings:   No deformities, ulcerations, or other skin breakdown Normal sensation of 10g monofilament Intact posterior tibialis and dorsalis pedis pulses    Depression screen [facility] 2/9 01/10/2021 09/03/2020 01/26/2020  Decreased Interest 2 2 3   Down, Depressed, Hopeless 3 2 3   PHQ - 2 Score 5 4 6   Altered sleeping 3 2 3   Tired, decreased energy 2 2 3   Change in appetite 3 2 3   Feeling bad or failure about yourself  2 2 1   Trouble concentrating 3 2 1   Moving slowly or fidgety/restless 0 0 3  Suicidal thoughts 0 0 0  PHQ-9 Score 18 14 20   Difficult doing work/chores Somewhat difficult - Somewhat difficult  Some recent data might be hidden    Assessment & Plan:   Essential hypertension Chronic, uncontrolled. She endorses compliance with medications. - continue Lisinopril and Amlodipine - transition from Indapamide to Chlorthalidone  - follow up 2 weeks for BP check and BMP - consider resistant HTN work up if remains elevated on 3 antihypertensives   Uncontrolled diabetes  mellitus with complication, with long-term current use of insulin (HCC) Chronic, uncontrolled. Patient take insulin possible 1-2 times per day only. She eats throughout the night without appropriate insulin treatment. Provided extensive education regarding diabetes management.  - continue Lantus 60U daily. May benefit from BID dosing? - continue Novolog 30 units with meals - restart Jardiance 25mg  QD - BMP in 2 weeks - strongly encouraged to monitor CBG's and keep a log - follow up 3 months for  A1C  Hyperlipidemia associated with type 2 diabetes mellitus Chronic.  - continue Lipitor 80mg  QD - lipid panel today  Depression Elevated PHQ-9 score of 18. Denies SI/HI. Notes that it is related to financial struggles and housing insecurity. Offered support however patient declined at this time. Encouraged to follow up if any assistance is desired or if continues to worsen.   Health Maintenance: - encouraged to get Shingles vaccine and TDAP from local pharmacy at earliest convenience - diabetic foot exam completed today  Orders Placed This Encounter  Procedures   Lipid Panel   Basic Metabolic Panel   Basic metabolic panel    Standing Status:   Future    Standing Expiration Date:   01/10/2022   POCT glycosylated hemoglobin (Hb A1C)   Meds ordered this encounter  Medications   empagliflozin (JARDIANCE) 25 MG TABS tablet    Sig: Take 1 tablet (25 mg total) by mouth daily.    Dispense:  90 tablet    Refill:  3   chlorthalidone (HYGROTON) 25 MG tablet    Sig: Take 1 tablet (25 mg total) by mouth daily.    Dispense:  30 tablet    Refill:  [place], DO PGY-3, [facility] 01/11/2021 3:14 PM

## 2021-01-10 ENCOUNTER — Ambulatory Visit (INDEPENDENT_AMBULATORY_CARE_PROVIDER_SITE_OTHER): Payer: Medicare Other | Admitting: Family Medicine

## 2021-01-10 ENCOUNTER — Encounter: Payer: Self-pay | Admitting: Family Medicine

## 2021-01-10 ENCOUNTER — Other Ambulatory Visit: Payer: Self-pay

## 2021-01-10 VITALS — BP 149/96 | HR 94 | Wt 234.8 lb

## 2021-01-10 DIAGNOSIS — F32A Depression, unspecified: Secondary | ICD-10-CM | POA: Diagnosis not present

## 2021-01-10 DIAGNOSIS — Z794 Long term (current) use of insulin: Secondary | ICD-10-CM | POA: Diagnosis not present

## 2021-01-10 DIAGNOSIS — IMO0002 Reserved for concepts with insufficient information to code with codable children: Secondary | ICD-10-CM

## 2021-01-10 DIAGNOSIS — E1165 Type 2 diabetes mellitus with hyperglycemia: Secondary | ICD-10-CM | POA: Diagnosis not present

## 2021-01-10 DIAGNOSIS — I10 Essential (primary) hypertension: Secondary | ICD-10-CM | POA: Diagnosis not present

## 2021-01-10 DIAGNOSIS — E785 Hyperlipidemia, unspecified: Secondary | ICD-10-CM

## 2021-01-10 DIAGNOSIS — E1169 Type 2 diabetes mellitus with other specified complication: Secondary | ICD-10-CM | POA: Diagnosis not present

## 2021-01-10 DIAGNOSIS — E118 Type 2 diabetes mellitus with unspecified complications: Secondary | ICD-10-CM | POA: Diagnosis not present

## 2021-01-10 LAB — POCT GLYCOSYLATED HEMOGLOBIN (HGB A1C): HbA1c, POC (controlled diabetic range): 9.6 % — AB (ref 0.0–7.0)

## 2021-01-10 MED ORDER — CHLORTHALIDONE 25 MG PO TABS: 25.0000 mg | ORAL_TABLET | Freq: Every day | ORAL | 1 refills | Status: DC

## 2021-01-10 MED ORDER — EMPAGLIFLOZIN 25 MG PO TABS: 25.0000 mg | ORAL_TABLET | Freq: Every day | ORAL | 3 refills | Status: DC

## 2021-01-10 NOTE — Patient Instructions (Addendum)
It was a pleasure to see you today!  Thank you for choosing [facility] for your primary care.   Our plans for today were: Diabetes:  Please continue the Lantus 60 units and Novolog 35 units with meals Start the jardiance 25mg  daily Continue your cholesterol (lipitor) medicien Blood pressure: Please continue Lisinopril 40mg  daily and Amlodipine 10mg  daily Start Chlorthalidone 25mg  daily  Stop Indapamide Follow up on 01/24/21 at 1:30 pm for repeat labs and blood pressure check    To keep you healthy, please keep in mind the following health maintenance items that you are due for:   Colon cancer screening Shingles vaccine - get form local pharmacy  Tetanus vaccine - also get that from local pharmacy   We are checking some labs today, I will call you if they are abnormal will send you a MyChart message or a letter if they are normal.  If you do not hear about your labs in the next 2 weeks please let us know  BRING ALL OF YOUR MEDICATIONS WITH YOU TO EVERY VISIT   You should return to our clinic in 3 months for diabetes check up.   Best Wishes,   Mina Marble, DO

## 2021-01-11 ENCOUNTER — Encounter: Payer: Self-pay | Admitting: Family Medicine

## 2021-01-11 LAB — BASIC METABOLIC PANEL
BUN/Creatinine Ratio: 14 (ref 12–28)
BUN: 16 mg/dL (ref 8–27)
CO2: 19 mmol/L — ABNORMAL LOW (ref 20–29)
Calcium: 9.4 mg/dL (ref 8.7–10.3)
Chloride: 100 mmol/L (ref 96–106)
Creatinine, Ser: 1.12 mg/dL — ABNORMAL HIGH (ref 0.57–1.00)
Glucose: 312 mg/dL — ABNORMAL HIGH (ref 65–99)
Potassium: 4.4 mmol/L (ref 3.5–5.2)
Sodium: 141 mmol/L (ref 134–144)
eGFR: 54 mL/min/{1.73_m2} — ABNORMAL LOW (ref >59)

## 2021-01-11 LAB — LIPID PANEL
Chol/HDL Ratio: 4.4 ratio (ref 0.0–4.4)
Cholesterol, Total: 176 mg/dL (ref 100–199)
HDL: 40 mg/dL (ref >39)
LDL Chol Calc (NIH): 96 mg/dL (ref 0–99)
Triglycerides: 234 mg/dL — ABNORMAL HIGH (ref 0–149)
VLDL Cholesterol Cal: 40 mg/dL (ref 5–40)

## 2021-01-11 NOTE — Assessment & Plan Note (Signed)
Elevated PHQ-9 score of 18. Denies SI/HI. Notes that it is related to financial struggles and housing insecurity. Offered support however patient declined at this time. Encouraged to follow up if any assistance is desired or if continues to worsen.

## 2021-01-11 NOTE — Assessment & Plan Note (Signed)
Chronic, uncontrolled. Patient take insulin possible 1-2 times per day only. She eats throughout the night without appropriate insulin treatment. Provided extensive education regarding diabetes management.  - continue Lantus 60U daily. May benefit from BID dosing? - continue Novolog 30 units with meals - restart Jardiance 25mg  QD - BMP in 2 weeks - strongly encouraged to monitor CBG's and keep a log - follow up 3 months for A1C

## 2021-01-11 NOTE — Assessment & Plan Note (Signed)
>>  ASSESSMENT AND PLAN FOR UNCONTROLLED DIABETES MELLITUS WITH COMPLICATION, WITH LONG-TERM CURRENT USE OF INSULIN WRITTEN ON 01/11/2021  3:12 PM BY MULLIS, KIERSTEN P, DO  Chronic, uncontrolled. Patient take insulin possible 1-2 times per day only. She eats throughout the night without appropriate insulin treatment. Provided extensive education regarding diabetes management.  - continue Lantus 60U daily. May benefit from BID dosing? - continue Novolog 30 units with meals - restart Jardiance 25mg  QD - BMP in 2 weeks - strongly encouraged to monitor CBG's and keep a log - follow up 3 months for A1C

## 2021-01-11 NOTE — Assessment & Plan Note (Signed)
Chronic.  - continue Lipitor 80mg  QD - lipid panel today

## 2021-01-11 NOTE — Assessment & Plan Note (Signed)
Chronic, uncontrolled. She endorses compliance with medications. - continue Lisinopril and Amlodipine - transition from Indapamide to Chlorthalidone  - follow up 2 weeks for BP check and BMP - consider resistant HTN work up if remains elevated on 3 antihypertensives

## 2021-01-23 DIAGNOSIS — Z20822 Contact with and (suspected) exposure to covid-19: Secondary | ICD-10-CM | POA: Diagnosis not present

## 2021-01-24 ENCOUNTER — Other Ambulatory Visit: Payer: Medicare Other

## 2021-01-24 ENCOUNTER — Ambulatory Visit: Payer: Medicare Other

## 2021-01-26 ENCOUNTER — Other Ambulatory Visit: Payer: Self-pay | Admitting: Family Medicine

## 2021-01-26 DIAGNOSIS — E1165 Type 2 diabetes mellitus with hyperglycemia: Secondary | ICD-10-CM

## 2021-01-26 DIAGNOSIS — IMO0002 Reserved for concepts with insufficient information to code with codable children: Secondary | ICD-10-CM

## 2021-01-29 ENCOUNTER — Ambulatory Visit: Payer: Medicare Other

## 2021-01-29 ENCOUNTER — Other Ambulatory Visit: Payer: Self-pay

## 2021-01-29 ENCOUNTER — Other Ambulatory Visit: Payer: Medicare Other

## 2021-01-29 VITALS — BP 156/88 | HR 80

## 2021-01-29 DIAGNOSIS — I10 Essential (primary) hypertension: Secondary | ICD-10-CM

## 2021-01-29 NOTE — Patient Instructions (Addendum)
Your blood pressure is still elevated today.  Once you get home make sure the Chlorthalidone was delivered, if not, call me so I can call Kalman Shan to ensure delivery. Transition from Indapamide to Chlorthalidone. I have scheduled you a blood pressure follow-up with your new PCP for 7/14 @1 :45pm.  Please seek care immediately if you start to experience blurry vision, headache, chest pain, or SOB.

## 2021-01-29 NOTE — Progress Notes (Signed)
Patient here today for BP check.      Last BP was on 6/9 and was 149/96 HR 94.  BP today is 166/90 with a pulse of 80.   Repeat 15 minute later 156/88.  Checked BP in left arm with large cuff.    Symptoms present: none.   Patient reports she has still been taking Indapamide 2.5mg . I advised patient she should have transitioned to Chlorthalidone as of 6/9. Patient reports she believes the medication was delivered to her daughters house, however she has not been there to get it. Patient reports she will go by there today. Return precautions given for hypertension.   PCP apt scheduled for 7/14 for BP followup.

## 2021-01-30 LAB — BASIC METABOLIC PANEL
BUN/Creatinine Ratio: 12 (ref 12–28)
BUN: 12 mg/dL (ref 8–27)
CO2: 22 mmol/L (ref 20–29)
Calcium: 9.5 mg/dL (ref 8.7–10.3)
Chloride: 103 mmol/L (ref 96–106)
Creatinine, Ser: 0.97 mg/dL (ref 0.57–1.00)
Glucose: 283 mg/dL — ABNORMAL HIGH (ref 65–99)
Potassium: 4.1 mmol/L (ref 3.5–5.2)
Sodium: 141 mmol/L (ref 134–144)
eGFR: 64 mL/min/{1.73_m2} (ref >59)

## 2021-02-06 ENCOUNTER — Other Ambulatory Visit: Payer: Self-pay | Admitting: Family Medicine

## 2021-02-06 DIAGNOSIS — I10 Essential (primary) hypertension: Secondary | ICD-10-CM

## 2021-02-11 ENCOUNTER — Other Ambulatory Visit: Payer: Self-pay | Admitting: Family Medicine

## 2021-02-11 DIAGNOSIS — I10 Essential (primary) hypertension: Secondary | ICD-10-CM

## 2021-02-14 ENCOUNTER — Ambulatory Visit (INDEPENDENT_AMBULATORY_CARE_PROVIDER_SITE_OTHER): Payer: Medicare Other | Admitting: Student

## 2021-02-14 ENCOUNTER — Other Ambulatory Visit: Payer: Self-pay

## 2021-02-14 ENCOUNTER — Encounter: Payer: Self-pay | Admitting: Student

## 2021-02-14 DIAGNOSIS — F32A Depression, unspecified: Secondary | ICD-10-CM

## 2021-02-14 DIAGNOSIS — E118 Type 2 diabetes mellitus with unspecified complications: Secondary | ICD-10-CM

## 2021-02-14 DIAGNOSIS — G8929 Other chronic pain: Secondary | ICD-10-CM

## 2021-02-14 DIAGNOSIS — Z794 Long term (current) use of insulin: Secondary | ICD-10-CM

## 2021-02-14 DIAGNOSIS — M545 Low back pain, unspecified: Secondary | ICD-10-CM | POA: Diagnosis not present

## 2021-02-14 DIAGNOSIS — I10 Essential (primary) hypertension: Secondary | ICD-10-CM | POA: Diagnosis not present

## 2021-02-14 DIAGNOSIS — IMO0002 Reserved for concepts with insufficient information to code with codable children: Secondary | ICD-10-CM

## 2021-02-14 DIAGNOSIS — E1165 Type 2 diabetes mellitus with hyperglycemia: Secondary | ICD-10-CM

## 2021-02-14 MED ORDER — TRAMADOL HCL 50 MG PO TABS: 50.0000 mg | ORAL_TABLET | Freq: Every day | ORAL | 2 refills | Status: DC | PRN

## 2021-02-14 MED ORDER — VASCEPA 0.5 G PO CAPS: 1.0000 | ORAL_CAPSULE | Freq: Two times a day (BID) | ORAL | 2 refills | Status: DC

## 2021-02-14 MED ORDER — RIVAROXABAN 20 MG PO TABS: 20.0000 mg | ORAL_TABLET | Freq: Every day | ORAL | 3 refills | Status: DC

## 2021-02-14 MED ORDER — ONETOUCH VERIO W/DEVICE KIT: PACK | 0 refills | Status: DC

## 2021-02-14 NOTE — Assessment & Plan Note (Signed)
Back Pain  Use lidocaine patch on back  Use Voltaren gel on back  Take tramadol 50 mg as needed, once a day for breakthrough pain  Patient against seeking physical therapy

## 2021-02-14 NOTE — Patient Instructions (Signed)
It was great to see you! Thank you for allowing me to participate in your care!  I recommend that you always bring your medications to each appointment as this makes it easy to ensure we are on the correct medications and helps [place] not miss when refills are needed.  Our plans for today:  - Blood Pressure  Continue lisinopril and amlodipine.  Stop chlorthalidone  - Back Pain  Use lidocaine patch on back  Use Voltaren gel on back  Take tramadol as needed, once a day for breakthrough pain  - Depression  See counseling resources below  Take care and please follow up in 2 weeks  Dr. Holley Bouche, MD [facility]    Therapy and Counseling Resources Most providers on this list will take Medicaid. Patients with commercial insurance or Medicare should contact their insurance company to get a list of in network providers.  [facility]:Psychiatry and Counseling [address] [facility] [address] [place] [place], [place] [address] [place], [place], [place] [address]      [place] [address]  [place], [place] [address] [phone]  [place] [address] [place]  [place],  [address]       [phone]     [facility] (virtual only) [phone]  [facility] [address] [place], [place]  [facility]:  [place]. [place] [phone]  [facility]:  [place] [address] [place] [phone]  [facility] (under & uninsured) [address], [place] [phone]    [email or web address]    [place] [address]  [place]    [phone]  [facility] [place]     Phone:  [phone]     [place]  [place] [address]  [address]      [place], [place] ext [place]: [place], [place], [place]   [place] ([place] therapist) [email or web address]  [place] [address]   [place] [address]    [phone]    [facility]   [address],  [place], [place]   [place]  [place]  [facility]  [address] [place], [place]        [phone]  Open Access/Walk In Clinic under & uninsured  [facility]  [address] [place], [place] Crisis [phone]  [facility] the [place],  ([place])   [place], [place]: [phone]) 8:30 - 12; 1 - 2:30  [facility] of the [place],  [place], [place]    [phone]):8:30 - 12; 2 - 3PM  [facility] [employer],  [address]  [place]; [phone]):   Mon - Fri 8 AM - 5 PM  [facility] [place]  MWF 12:30 to 3:00 or call to schedule an appointment  [phone]  Specific Provider options Psychology Today  [email or web address] click on find a therapist  enter your zip code left side and select or tailor a therapist for your specific need.   [facility] Provider Directory  [email or web address]  (Medicaid)   Follow all drop down to find a provider  [place] or [email or web address] [address], [place], [place] Recovery support and educational   24- Hour Availability:   [facility]  [address] [place], [place] Crisis [phone]  [facility] of the [employer] [phone]  [place]  [phone]   [place]  [phone] (after  hours)  Therapeutic Alternative/Mobile Crisis   [phone]  [place] National Suicide Hotline  [phone] [place]ina Monks)  Call 911 or go to emergency room  [facility]  [phone]);  [place] and [employer]  [phone]); [place], [place], [place], [place], [place], [place], [place]

## 2021-02-14 NOTE — Assessment & Plan Note (Signed)
Depression  Counseling resources given on AVS

## 2021-02-14 NOTE — Progress Notes (Signed)
Subjective:   Blood Pressure: Not taking blood pressure at home. Notes that it's high today because she forgot to take her medicine. Lisionopril and amlodipine. Is able to take it daily without issue usually. Patient notes shortness of breath when she is moving and doing activity and finds relief when she rest.She notes abdominal pain that feels like an elctric shock. She feels the shock in her waist and midway through her back. Negative for chest pain, headache, or visual disturbances.  Back Pain Degenerative disk disease for 20 years, that is progressing. She takes gabapentin and tylenol Arthritic strength x2 at night for sleep. Describes the pain as sharp and achey, located in the middle of the back to the tailbone. It doesn't radiate or change. Says that tylenol and gabapentin are not enough. The pain is worse when she is seated or standing, and better when supine.   Dizziness No longer taking chlorthalidone because it makes her dizzy, makes her feel hangover/drunk.  She had 1 fall on left hip, still hurting, feels sore. Not sharp, doesn't radiate.   PHQ9 13 - moderate depression She has support from her church family that continue to give her hope, despite living situation (homeless with daughter and grand kids). Doesn't want to take medicine at this point, has a history of taking prozac and it made her angry. Good mood, notably in pain, no weeping or a/v hallucinations     Objective:    Physical Exam: General: Patient able to stand from seating w/o help, also able to stand on toes and heels while holding hands for balance. Patient able to get onto exam table. Range of motion in knee and hip is full, w/o pain.  Cardiac: normal s1/s2, no rubs or gallops.  Abdominal: WNL, no pain with palpation  Respiratory: CTABL, no wheezes, rales or stridor   Assessment:  Essential Hypertension -Chronic, poorly controlled, she endorses compliance with meds: -Dizzines with chlorthalidone  use lisinopril 40 mg daily amlodipine 10 mg daily  Degenerative disk disease: -Chronic pain, little to no relief with arthritic strength tylenol. Has tried Toradol and PTin past  Depression -PHQ9 13 - moderate depression    Plan:    Blood Pressure  Continue lisinopril and amlodipine.  Stop chlorthalidone  F/u in 2 weeks for BP check  - Back Pain  Use lidocaine patch on back  Use Voltaren gel on back  Take tramadol as needed, once a day for breakthrough pain  Patient against seeking physical therapy  - Depression  Counseling resources given on AVS  -Medications Refill Xaralto and one touch meter Started Vascepa for Triglyceride level

## 2021-02-14 NOTE — Assessment & Plan Note (Signed)
Blood Pressure  Continue lisinopril 40mg  and amlodipine 10mg   Stop chlorthalidone 25mg   F/u in 2 weeks for BP check

## 2021-02-20 ENCOUNTER — Telehealth: Payer: Self-pay

## 2021-02-20 NOTE — Telephone Encounter (Signed)
Received fax from pharmacy, PA needed on Tramadol.  Clinical questions submitted via Cover My Meds.  Waiting on response, could take up to 72 hours.  Cover My Meds info: Key: [id number]  Talbot Grumbling, RN

## 2021-02-21 NOTE — Telephone Encounter (Signed)
Please see the following determination on Tramadol.     Called pharmacy and provided with approval. Attempted to call patient to inform. No answer, left HIPAA compliant VM that medication was approved.   Talbot Grumbling, RN

## 2021-02-27 NOTE — Progress Notes (Signed)
hctz   SUBJECTIVE:   CHIEF COMPLAINT / HPI:   F/u Blood pressure Blood pressure high today, patient is compliant with medications and takes them in the morning. No headaches, changes in vision, chest pain, she has shortness of breath due to walking. She states that her BP was well controlled on medication in the past but has since been switched and poorly controlled.  Depression: PHQ9 scored 17. Patient complains of difficult cicrumstance at home and episodes of crying multiple times a day. Patient feels that she has good support through church, but is unsure about family dynamic/circumstance (custody battle for grandson). Patient cried at today's visit, discussing family circumstance.    PERTINENT  PMH / PSH: HTN, CKD, HLD, depression, DM  OBJECTIVE:   Today's Vitals   02/28/21 1343  BP: (!) 150/102  Pulse: 98  SpO2: 96%  Weight: 223 lb 9.6 oz (101.4 kg)  Height: '5\' 1"'$  (1.549 m)  PainSc: 8   PainLoc: Back   Body mass index is 42.25 kg/m.  Physical Exam Cardiovascular:     Rate and Rhythm: Normal rate and regular rhythm.     Pulses: Normal pulses.     Heart sounds: Normal heart sounds. No murmur heard.   No friction rub. No gallop.  Pulmonary:     Effort: Pulmonary effort is normal.     Breath sounds: Normal breath sounds. No stridor. No wheezing or rhonchi.  Abdominal:     General: Bowel sounds are normal.     Palpations: Abdomen is soft. There is no mass.     Tenderness: There is no abdominal tenderness.     ASSESSMENT/PLAN:   Essential hypertension Blood pressure poorly controlled. Taking amlodipine 10 and lisinopril 40  -Start HCTZ 12.5 mg QD  Depression Patient is depressed with a PHQ9 scoring for moderately severe depression. Patient is dealing with complex family dynamics giving her more stress. Patient cried at visit, when discussing family dynamics. She does not want medication.  -Referral for CCM for therapy  Follow up  -2 weeks, checking BMP  -DM  management   Holley Bouche, MD [facility]

## 2021-02-28 ENCOUNTER — Encounter: Payer: Self-pay | Admitting: Student

## 2021-02-28 ENCOUNTER — Telehealth: Payer: Self-pay | Admitting: *Deleted

## 2021-02-28 ENCOUNTER — Other Ambulatory Visit: Payer: Self-pay

## 2021-02-28 ENCOUNTER — Ambulatory Visit (INDEPENDENT_AMBULATORY_CARE_PROVIDER_SITE_OTHER): Payer: Medicare Other | Admitting: Student

## 2021-02-28 VITALS — BP 150/102 | HR 98 | Ht 61.0 in | Wt 223.6 lb

## 2021-02-28 DIAGNOSIS — I10 Essential (primary) hypertension: Secondary | ICD-10-CM | POA: Diagnosis not present

## 2021-02-28 DIAGNOSIS — F4329 Adjustment disorder with other symptoms: Secondary | ICD-10-CM

## 2021-02-28 DIAGNOSIS — F32A Depression, unspecified: Secondary | ICD-10-CM

## 2021-02-28 MED ORDER — AMLODIPINE BESYLATE 10 MG PO TABS: 10.0000 mg | ORAL_TABLET | Freq: Every day | ORAL | 3 refills | Status: DC

## 2021-02-28 MED ORDER — HYDROCHLOROTHIAZIDE 25 MG PO TABS: 12.5000 mg | ORAL_TABLET | Freq: Every day | ORAL | 3 refills | Status: DC

## 2021-02-28 NOTE — Chronic Care Management (AMB) (Signed)
  Care Management   Note  02/28/2021 Name: [name] MRN: [id number] DOB: August 23, 1955  [name] is a 65 y.o. year old female who is a primary care patient of Sowell, Erlene Quan, MD. I reached out to Thornell Sartorius by phone today in response to a referral sent by [name]'s PCP, Holley Bouche, MD.  [name] was given information about care management services today including:  Care management services include personalized support from designated clinical staff supervised by her physician, including individualized plan of care and coordination with other care providers 24/7 contact phone numbers for assistance for urgent and routine care needs. The patient may stop care management services at any time by phone call to the office staff.  Patient agreed to services and verbal consent obtained.   Follow up plan: Telephone appointment with care management team member scheduled for:03/11/21  Kerem Gilmer  Care Guide, Embedded Care Coordination [facility]  Care Management  Direct Dial: [phone]

## 2021-02-28 NOTE — Addendum Note (Signed)
Addended by: Owens Shark, Mortimer Bair on: 02/28/2021 07:10 PM   Modules accepted: Level of Service

## 2021-02-28 NOTE — Patient Instructions (Signed)
It was great to see you! Thank you for allowing me to participate in your care!    Our plans for today:   High Blood Pressure: - Start HCTZ 12.5 mg daily  Depression:  - Counseling referral  - Deep breathing exercises  Follow up in 2 weeks  -Labs BMP  Take care and seek immediate care sooner if you develop any concerns.   Dr. Holley Bouche, MD [facility]

## 2021-02-28 NOTE — Assessment & Plan Note (Addendum)
Patient is depressed with a PHQ9 scoring for moderately severe depression. Patient is dealing with complex family dynamics giving her more stress. Patient cried at visit, when discussing family dynamics. She does not want medication.  -Referral for CCM for therapy  -Consoled and practiced deep breathing to relax

## 2021-02-28 NOTE — Assessment & Plan Note (Addendum)
Blood pressure poorly controlled. Taking amlodipine 10 and lisinopril 40  -Start HCTZ 12.5 mg QD  -F/u in 2 weeks for BMP

## 2021-03-07 ENCOUNTER — Ambulatory Visit (INDEPENDENT_AMBULATORY_CARE_PROVIDER_SITE_OTHER): Payer: Medicare Other | Admitting: Student

## 2021-03-07 ENCOUNTER — Encounter: Payer: Self-pay | Admitting: Student

## 2021-03-07 ENCOUNTER — Other Ambulatory Visit: Payer: Self-pay

## 2021-03-07 VITALS — BP 140/80 | HR 83 | Ht 61.0 in | Wt 227.1 lb

## 2021-03-07 DIAGNOSIS — I10 Essential (primary) hypertension: Secondary | ICD-10-CM | POA: Diagnosis not present

## 2021-03-07 DIAGNOSIS — I482 Chronic atrial fibrillation, unspecified: Secondary | ICD-10-CM | POA: Diagnosis not present

## 2021-03-07 MED ORDER — RIVAROXABAN 20 MG PO TABS: 20.0000 mg | ORAL_TABLET | Freq: Every day | ORAL | 0 refills | Status: DC

## 2021-03-07 NOTE — Progress Notes (Signed)
    SUBJECTIVE:   CHIEF COMPLAINT / HPI:   Ms Shakina Hillier is a 65 yo female with history of poorly controlled HTN, and DM presented today for follow up visit to check her BP and for BMP lab.  Patient reports compliance with her blood pressure medication.  She does not check her blood pressure at home and expressed desire to be on one consistent blood pressure medication being that her BP medication have been changed multiple times.  Her blood pressure today is 140/80.  Patient reported being homeless and losing her Xarelto in the process of moving around.  Her insurance will not fill her medication until the next refill.  Ms. Winding said she lost about half a months worth of pills and would like to get some samples if possible.  PERTINENT  PMH / PSH:  Past Medical History:  Diagnosis Date   Atrial fibrillation ([facility])    CVA (cerebral vascular accident) ([facility])    left pontine and frontal lobe   Diabetes mellitus    type 2   Diabetes mellitus out of control ([place]) 06/12/2009   [facility] 02/13/14 - Early cataracts OD, no diabetic retinopathy, f/u in 12 months; Dr. Claudean Kinds.    Hypercholesteremia    Hypertension    Hypertension    Hypertensive urgency 05/2006   TIA (transient ischemic attack) 2017     ROS is unremarkable aside from right abdominal tenderness which she attributed to a recent shingle   OBJECTIVE:  Blood pressure 140/80, pulse 83, height '5\' 1"'$  (1.549 m), weight 227 lb 2 oz (103 kg), SpO2 99 %.  Physical Exam General:Alert, well appearing, Oriented x4 Cardiovascular: RRR, normal S1/S2 Respiratory: CTAB Abdomen: RLQ tenderness, soft, no distention Extremities: No edema is in extremities.   ASSESSMENT/PLAN:   Blood Pressure f/u Patient presented for follow-up on blood pressure medication. Her blood pressure today is 140/80.   -PT to continue on current blood pressure regimen of 12.5 g of HCTZ daily. -Ordered BMP -Recommend patient takes blood  pressure and blood glucose diary daily  Lost Xarelto Patient misplaced 1 months worth of Xarelto medication.  Provided patient with sample of 21 tablets of 20 mg Xarelto to cover till her next refill.    Alen Bleacher, MD [facility]

## 2021-03-07 NOTE — Patient Instructions (Addendum)
Ms. Peskin It was wonderful to see you today. Thank you for allowing me to be a part of your care. Below is a short summary of what we discussed at your visit today:  Your blood pressure looks good today at 140/80. I recommend you start keeping a diary of your BP check and Blood glucose daily at home.  Will check this at your next visit in 4 weeks.  For your reported lost xarelto, I have given you 21 days worth of '20mg'$  xarelto to cover till your next refill date.  We will be drawing some labs today to check your electrolyte and kidney function.  Please bring all of your medications to every appointment!  If you have any questions or concerns, please do not hesitate to contact us via phone or MyChart message.   Alen Bleacher, MD [facility]

## 2021-03-08 LAB — BASIC METABOLIC PANEL
BUN/Creatinine Ratio: 11 — ABNORMAL LOW (ref 12–28)
BUN: 11 mg/dL (ref 8–27)
CO2: 22 mmol/L (ref 20–29)
Calcium: 9.6 mg/dL (ref 8.7–10.3)
Chloride: 96 mmol/L (ref 96–106)
Creatinine, Ser: 1.01 mg/dL — ABNORMAL HIGH (ref 0.57–1.00)
Glucose: 385 mg/dL — ABNORMAL HIGH (ref 65–99)
Potassium: 4.2 mmol/L (ref 3.5–5.2)
Sodium: 135 mmol/L (ref 134–144)
eGFR: 61 mL/min/{1.73_m2} (ref >59)

## 2021-03-11 ENCOUNTER — Telehealth: Payer: Self-pay | Admitting: Licensed Clinical Social Worker

## 2021-03-11 ENCOUNTER — Telehealth: Payer: Medicare Other

## 2021-03-11 NOTE — Chronic Care Management (AMB) (Signed)
    Clinical Social Work  Care Management   Phone Outreach    03/11/2021 Name: WETONA HOLLINGSWORTH MRN: [id number] DOB: [birth year]  ALYSSE HOCKENBERRY is a 65 y.o. year old female who is a primary care patient of Sowell, Erlene Quan, MD .  Referral to connect patient with counseling services via [facility] due to stress and complex family dynamics.  CCM LCSW reached out to patient today by phone twice to introduce self, assess needs and offer Care Management services and interventions.    Telephone outreach was unsuccessful A HIPPA compliant phone message was left for the patient providing contact information and requesting a return call.   Plan:CCM LCSW will wait for return call. If no return call is received, Will route chart to Care Guide to see if patient would like to reschedule phone appointment   Review of patient status, including review of consultants reports, relevant laboratory and other test results, and collaboration with appropriate care team members and the patient's provider was performed as part of comprehensive patient evaluation and provision of care management services.    Casimer Lanius, [place] / [place]   [phone] 2:06 PM

## 2021-03-12 ENCOUNTER — Other Ambulatory Visit: Payer: Self-pay | Admitting: Student

## 2021-03-12 DIAGNOSIS — E1165 Type 2 diabetes mellitus with hyperglycemia: Secondary | ICD-10-CM

## 2021-03-12 DIAGNOSIS — IMO0002 Reserved for concepts with insufficient information to code with codable children: Secondary | ICD-10-CM

## 2021-03-15 ENCOUNTER — Telehealth: Payer: Self-pay

## 2021-03-15 NOTE — Telephone Encounter (Signed)
LVM to have pt call back to schedule AWV.   RE: confirm insurance and schedule AWV on my schedule if times are convenient for patient or other AWV schedule as template permits.   

## 2021-03-21 ENCOUNTER — Telehealth: Payer: Self-pay | Admitting: *Deleted

## 2021-03-21 NOTE — Chronic Care Management (AMB) (Signed)
  Care Management   Note  03/21/2021 Name: [name] MRN: [id number] DOB: 1956/02/03  [name] is a 65 y.o. year old female who is a primary care patient of Sowell, Erlene Quan, MD and is actively engaged with the care management team. I reached out to Thornell Sartorius by phone today to assist with re-scheduling an initial visit with the Licensed Clinical Social Worker  Follow up plan: Unsuccessful telephone outreach attempt made. A HIPAA compliant phone message was left for the patient providing contact information and requesting a return call.  The care management team will reach out to the patient again over the next 7 days.  If patient returns call to provider office, please advise to call [place]  at [phone].  [facility] Management  Direct Dial: [phone]

## 2021-03-28 NOTE — Chronic Care Management (AMB) (Signed)
  Care Management   Note  03/28/2021 Name: GIORGINA GRUENEWALD MRN: [id number] DOB: January 30, 1956  THIENKIM SJODIN is a 65 y.o. year old female who is a primary care patient of Sowell, Erlene Quan, MD and is actively engaged with the care management team. I reached out to Thornell Sartorius by phone today to assist with re-scheduling an initial visit with the Licensed Clinical Social Worker  Follow up plan: Telephone appointment with care management team member scheduled for:04/09/21  [facility] Management  Direct Dial: [phone]

## 2021-04-03 ENCOUNTER — Other Ambulatory Visit: Payer: Self-pay | Admitting: Student

## 2021-04-03 DIAGNOSIS — I10 Essential (primary) hypertension: Secondary | ICD-10-CM

## 2021-04-09 ENCOUNTER — Ambulatory Visit: Payer: Medicare Other | Admitting: Licensed Clinical Social Worker

## 2021-04-09 DIAGNOSIS — Z7189 Other specified counseling: Secondary | ICD-10-CM

## 2021-04-09 NOTE — Chronic Care Management (AMB) (Signed)
Care Management Clinical Social Work Note  04/09/2021 Name: [name] MRN: [id number] DOB: [birth year]  [name] is a 65 y.o. year old female who is a primary care patient of Holley Bouche, MD.  The Care Management team was consulted for assistance with coordination needs.Mental Health Counseling and Resources  Engaged with patient by telephone for initial visit in response to provider referral for social work chronic care management and care coordination services  Consent to Services:  [name] was given information about Care Management services today including:  Care Management services includes personalized support from designated clinical staff supervised by her physician, including individualized plan of care and coordination with other care providers 24/7 contact phone numbers for assistance for urgent and routine care needs. The patient may stop case management services at any time by phone call to the office staff.  Patient agreed to services and consent obtained.  Assessment: .   Patient is currently experiencing symptoms of  adjustment disorder which seems to be exacerbated by history of depression  She has noticed a decrease in crying and not sure if she wants to move forward with counseling.. See Care Plan below for interventions and patient self-care actives.  Recent life changes or stressors: grandson no longer living with her; recently moved in with daughter; unable to get her own housing.  Recommendation: Patient may benefit from, and is in agreement in agreement to start counseling.   Follow up Plan: Patient would like continued follow-up from CCM LCSW .  per patient's request will follow up in 2 weeks.  Will call office if needed prior to next encounter.     Review of patient past medical history, allergies, medications, and health status, including review of relevant consultants reports was performed today as part of a comprehensive evaluation and provision  of chronic care management and care coordination services.  SDOH (Social Determinants of Health) assessments and interventions performed:  SDOH Interventions    Flowsheet Row Most Recent Value  SDOH Interventions   Housing Interventions --  [currently living with daughter]  Stress Interventions Provide Counseling  Transportation Interventions Other (Comment)  [insurance provider]        Advanced Directives Status: Not addressed in this encounter.  Care Plan  Allergies  Allergen Reactions   Propoxyphene N-Acetaminophen Hives, Itching, Swelling and Other (See Comments)    REACTION: Hallucinations   Glipizide Nausea And Vomiting   Metformin And Related Diarrhea    Outpatient Encounter Medications as of 04/09/2021  Medication Sig Note   acetaminophen (TYLENOL) 500 MG tablet Take 1,000 mg by mouth every 6 (six) hours as needed for moderate pain. 09/13/2020: Taking 2 tablets twice daily most days   amLODipine (NORVASC) 10 MG tablet Take 1 tablet (10 mg total) by mouth daily.    atorvastatin (LIPITOR) 80 MG tablet Take 1 tablet (80 mg total) by mouth daily. (Cholesterol)    Blood Glucose Monitoring Suppl (ONETOUCH VERIO FLEX SYSTEM) w/Device KIT Check blood sugar 3 times per day.    clobetasol ointment (TEMOVATE) 4.09 % Apply 1 application topically 2 (two) times daily.    Continuous Blood Gluc Receiver (DEXCOM G6 RECEIVER) DEVI 1 kit by Does not apply route as directed.    Continuous Blood Gluc Sensor (DEXCOM G6 SENSOR) MISC Inject 3 Devices into the skin as directed.    Continuous Blood Gluc Transmit (DEXCOM G6 TRANSMITTER) MISC Inject 1 Device into the skin as directed.    diclofenac Sodium (VOLTAREN) 1 % GEL Apply 2  g topically 4 (four) times daily. 09/13/2020: Using almost every day   empagliflozin (JARDIANCE) 25 MG TABS tablet Take 1 tablet (25 mg total) by mouth daily.    gabapentin (NEURONTIN) 300 MG capsule Take 3 capsules (900 mg total) by mouth 3 (three) times daily.    glucose  blood (ONETOUCH VERIO) test strip use to test three times daily    hydrochlorothiazide (HYDRODIURIL) 25 MG tablet Take 0.5 tablets (12.5 mg total) by mouth daily.    Icosapent Ethyl (VASCEPA) 0.5 g CAPS Take 1 capsule by mouth 2 (two) times daily.    insulin glargine (LANTUS SOLOSTAR) 100 UNIT/ML Solostar Pen Inject 25 Units into the skin daily.    insulin lispro (HUMALOG) 100 UNIT/ML KwikPen Inject 25 Units into the skin daily.    lidocaine (LIDODERM) 5 % Place 1 patch onto the skin daily. Remove & Discard patch within 12 hours or as directed by MD    lisinopril (ZESTRIL) 40 MG tablet Take 1 tablet (40 mg total) by mouth daily.    OneTouch Delica Lancets 09F MISC Check blood sugar 3 times daily    rivaroxaban (XARELTO) 20 MG TABS tablet Take 1 tablet (20 mg total) by mouth daily with supper.    rivaroxaban (XARELTO) 20 MG TABS tablet Take 1 tablet (20 mg total) by mouth daily with supper for 21 days.    Semaglutide,0.25 or 0.5MG/DOS, (OZEMPIC, 0.25 OR 0.5 MG/DOSE,) 2 MG/1.5ML SOPN Inject 0.5 mg weekly.    SURE COMFORT PEN NEEDLES 31G X 8 MM MISC 1 applicator by Does not apply route 4 (four) times daily.    traMADol (ULTRAM) 50 MG tablet Take 1 tablet (50 mg total) by mouth daily as needed.    No facility-administered encounter medications on file as of 04/09/2021.    Patient Active Problem List   Diagnosis Date Noted   Diabetic neuropathy ([place]) 06/21/2018   Chronic midline low back pain 06/21/2018   Uncontrolled diabetes mellitus with complication, with long-term current use of insulin (HCC)    Mild obstructive sleep apnea 11/18/2013   Atrial fibrillation ([place]) 06/07/2009   Depression 09/29/2008   Hyperlipidemia associated with type 2 diabetes mellitus ([facility]) 08/22/2008   CHRONIC KIDNEY DISEASE STAGE II ([facility]) 08/01/2008   Essential hypertension 04/16/2007    Conditions to be addressed/monitored: Depression; Mental Health Concerns   Care Plan : General Social Work (Adult)  Updates made  by Maurine Cane, LCSW since 04/09/2021 12:00 AM     Problem: Emotional Distress      Goal: Emotional Health Supported by connecting with counseling   Start Date: 04/09/2021  This Visit's Progress: On track  Priority: High  Note:   Current barriers:   Chronic Mental Health needs related to symptoms of depression triggered by adjustment disorder Needs Support, Education, and Care Coordination in order to meet unmet mental health needs. Clinical Goal(s): patient will work with mental health provider to address needs related to counseling   Clinical Interventions:  Inter-disciplinary care team collaboration (see longitudinal plan of care) Assessed patient's previous and current treatment, coping skills, support system and barriers to care  Review various resources, discussed options and provided patient information about Department of Social Services ( food stamps ), Transportation provided by insurance provider, Enhanced Benefits connected with insurance provider , Housing resources (and barriers), and Options for mental health treatment based on need and insurance Solution-Focused Strategies, Mindfulness or Psychologist, educational, Active listening / Reflection utilized , Emotional Supportive Provided,  Provided psychoeducation for mental health  needs , and motivational interviewing  Quality of sleep assessed & Sleep Hygiene techniques promoted , Participation in counseling encouraged ,  Provided EMMI education information on Insomnia and getting a good night sleep and Breathing to relax, Suicidal Ideation/Homicidal Ideation assessed:, and Discussed referral to [facility] to assist with connecting to mental health provider ; Patient Goals/Self-Care Activities: Over the next 14 days Call your insurance provider to discuss transportation options I have placed a referral with [facility] to assist with connecting you with a mental health provider. they will contact you once a provider is located. Review  your EMMI educational information on Insomnia and getting a good night sleep and Breathing to relax,      Casimer Lanius, [place] / [place]   [phone] 2:59 PM

## 2021-04-09 NOTE — Patient Instructions (Signed)
Licensed Clinical Social Worker Visit Information  Goals we discussed today:   Goals Addressed             This Visit's Progress    Begin and Stick with Counseling-Depression       Timeframe:  Short-Term Goal Priority:  High Start Date9/01/2021                             Expected End Date:                       Follow Up Date 04/23/21    Patient Goals/Self-Care Activities: Over the next 14 days Call your insurance provider to discuss transportation options I have placed a referral with [facility] to assist with connecting you with a mental health provider. they will contact you once a provider is located. Review your EMMI educational information on Insomnia and getting a good night sleep and Breathing to relax,      Why is this important?   Beating depression may take some time.  If you don't feel better right away, don't give up on your treatment plan.         Materials provided: Yes: EMMI  Ms. Ewer was given information about Care Management services today including:  Care Management services include personalized support from designated clinical staff supervised by her physician, including individualized plan of care and coordination with other care providers 24/7 contact phone numbers for assistance for urgent and routine care needs. The patient may stop Care Management services at any time by phone call to the office staff.  Patient agreed to services and verbal consent obtained.   The patient verbalized understanding of instructions, educational materials, and care plan provided today and declined offer to receive copy of patient instructions, educational materials, and care plan.   Follow up plan: Appointment scheduled for SW follow up with client by phone on: 04/23/21   Casimer Lanius, [place] / [place]   [phone] 3:01 PM

## 2021-04-10 ENCOUNTER — Other Ambulatory Visit: Payer: Self-pay | Admitting: Family Medicine

## 2021-04-10 ENCOUNTER — Other Ambulatory Visit: Payer: Self-pay | Admitting: Student

## 2021-04-10 DIAGNOSIS — IMO0002 Reserved for concepts with insufficient information to code with codable children: Secondary | ICD-10-CM

## 2021-04-10 DIAGNOSIS — E1165 Type 2 diabetes mellitus with hyperglycemia: Secondary | ICD-10-CM

## 2021-04-23 ENCOUNTER — Telehealth: Payer: Medicare Other

## 2021-04-23 ENCOUNTER — Ambulatory Visit: Payer: Self-pay | Admitting: Licensed Clinical Social Worker

## 2021-04-23 NOTE — Chronic Care Management (AMB) (Signed)
  Care Management  Collaboration  Note  04/23/2021 Name: [name] MRN: [id number] DOB: 18-Jun-1956  [name] is a 65 y.o. year old female who is a primary care patient of Sowell, Erlene Quan, MD. The CCM team was consulted reference care coordination needs for Mental Health Counseling and Resources.  Assessment: Patient was not interviewed or contacted during this encounter. Unsuccessful outreach to patient today. See Care Plan or interventions for patient self-care actives.   Intervention:Conducted brief assessment, recommendations and relevant information discussed. CCM :LCSW collaborated with [place] to connect patient with a therapist .    Follow up Plan:  Left voice message for patient to call  will route to care guide to see if patient would like to reschedule.   Review of patient past medical history, allergies, medications, and health status, including review of pertinent consultant reports was performed as part of comprehensive evaluation and provision of care management/care coordination services.   Care Plan Conditions to be addressed/monitored per PCP order:  Mental Health Concerns    Care Plan : General Social Work (Adult)  Updates made by Maurine Cane, LCSW since 04/23/2021 12:00 AM     Problem: Emotional Distress      Goal: Emotional Health Supported by connecting with counseling   Start Date: 04/09/2021  Recent Progress: On track  Priority: High  Note:   Current barriers:   [facility] has assigned patient a therapist; notes indicate provider spoke to patient but no appointment scheduled Chronic Mental Health needs related to symptoms of depression triggered by adjustment disorder Needs Support, Education, and Care Coordination in order to meet unmet mental health needs. Clinical Goal(s): patient will work with mental health provider to address needs related to counseling   Clinical Interventions:  Inter-disciplinary care team collaboration (see longitudinal  plan of care) Assessed patient's previous and current treatment, coping skills, support system and barriers to care  Review various resources, discussed options and provided patient information about Department of Social Services ( food stamps ), Transportation provided by insurance provider, Enhanced Benefits connected with insurance provider , Housing resources (and barriers), and Options for mental health treatment based on need and insurance Solution-Focused Strategies, Mindfulness or Psychologist, educational, Active listening / Reflection utilized , Emotional Supportive Provided,  Provided psychoeducation for mental health needs , and motivational interviewing  Quality of sleep assessed & Sleep Hygiene techniques promoted , Participation in counseling encouraged ,  Provided EMMI education information on Insomnia and getting a good night sleep and Breathing to relax, Suicidal Ideation/Homicidal Ideation assessed:, and Discussed referral to [facility] to assist with connecting to mental health provider ; Patient Goals/Self-Care Activities: Over the next 14 days Call your insurance provider to discuss transportation options I have placed a referral with [facility] to assist with connecting you with a mental health provider. they will contact you once a provider is located. Review your EMMI educational information on Insomnia and getting a good night sleep and Breathing to relax,    [name], [place] / [place]   [phone] 1:57 PM

## 2021-04-23 NOTE — Patient Instructions (Signed)
   Patient was not contacted during this encounter.  LCSW collaborated with care team to accomplish patient's care plan goal   Peniel Biel, LCSW Care Management & Coordination  [phone]  

## 2021-04-25 ENCOUNTER — Other Ambulatory Visit: Payer: Self-pay | Admitting: Family Medicine

## 2021-04-25 DIAGNOSIS — I10 Essential (primary) hypertension: Secondary | ICD-10-CM

## 2021-05-01 ENCOUNTER — Telehealth: Payer: Self-pay | Admitting: *Deleted

## 2021-05-01 NOTE — Chronic Care Management (AMB) (Signed)
  Care Management   Note  05/01/2021 Name: [name] MRN: [id number] DOB: 1955/08/05  [name] is a 65 y.o. year old female who is a primary care patient of Sowell, Erlene Quan, MD and is actively engaged with the care management team. I reached out to [facility] Sartorius by phone today to assist with re-scheduling a follow up visit with the Licensed Clinical Social Worker  Follow up plan: Patient declines further follow up and engagement by the care management team. Appropriate care team members and provider have been notified via electronic communication. The patient has been provided with contact information for the care management team and has been advised to call with any health related questions or concerns.   [facility]  Direct Dial: [phone]

## 2021-05-07 ENCOUNTER — Other Ambulatory Visit: Payer: Self-pay | Admitting: Student

## 2021-06-24 ENCOUNTER — Other Ambulatory Visit: Payer: Self-pay

## 2021-06-24 ENCOUNTER — Encounter: Payer: Self-pay | Admitting: Family Medicine

## 2021-06-24 ENCOUNTER — Ambulatory Visit (INDEPENDENT_AMBULATORY_CARE_PROVIDER_SITE_OTHER): Payer: Medicare Other | Admitting: Family Medicine

## 2021-06-24 VITALS — BP 154/88 | HR 83 | Wt 229.0 lb

## 2021-06-24 DIAGNOSIS — N644 Mastodynia: Secondary | ICD-10-CM

## 2021-06-24 DIAGNOSIS — Z23 Encounter for immunization: Secondary | ICD-10-CM

## 2021-06-24 MED ORDER — ZOSTER VAC RECOMB ADJUVANTED 50 MCG/0.5ML IM SUSR: 0.5000 mL | Freq: Once | INTRAMUSCULAR | 0 refills | Status: AC

## 2021-06-24 NOTE — Progress Notes (Signed)
    SUBJECTIVE:   CHIEF COMPLAINT / HPI: left breast pain  Breast pain Patient has noted constant pain on the left side of her breast rating from her left side up to her left nipple ongoing for 1 month.  Denies breast mass, nipple discharge, rash.  Patient states she had shingles earlier this year on her right side of her back which was manifested as burning but did not have a rash.  She wonders if the pain could be due to lifting up her grandchild though she normally uses her right arm.  Otherwise no known injuries.  PERTINENT  PMH / PSH: Shingles treated with valacyclovir January 2022  OBJECTIVE:   BP (!) 154/88   Pulse 83   Wt 229 lb (103.9 kg)   SpO2 96%   BMI 43.27 kg/m   General: Obese, NAD GU: Chaperone present.  Pendulous breasts.  No masses appreciated.  No lesions or rash visualized.  No axillary lymphadenopathy.  ASSESSMENT/PLAN:   Breast pain Low suspicion for shingles without dermatological findings.  Postherpetic neuralgia a consideration if it were located on the right side.  No masses appreciated but will order mammogram as she is due for this.  Suspect MSK etiology or secondary to stretching of Cooper's ligaments due to large pendulous breasts.   HCM - due for second dose of shingles vaccine, ordered to pharmacy - Covid bivalent booster given  Zola Button, MD [place]

## 2021-06-24 NOTE — Patient Instructions (Addendum)
It was nice seeing you today!  Get your mammogram at the [facility].  Make sure you always wear a well-fitting bra. You can try warm compresses or ice packs or gentle massage.  Get your second shingles vaccine.  Please arrive at least 15 minutes prior to your scheduled appointments.  Stay well, Zola Button, MD [place] [phone]

## 2021-07-02 ENCOUNTER — Other Ambulatory Visit: Payer: Self-pay | Admitting: Family Medicine

## 2021-07-02 DIAGNOSIS — N644 Mastodynia: Secondary | ICD-10-CM

## 2021-08-16 ENCOUNTER — Ambulatory Visit: Payer: Medicare Other

## 2021-08-16 ENCOUNTER — Ambulatory Visit
Admission: RE | Admit: 2021-08-16 | Discharge: 2021-08-16 | Disposition: A | Discharge status: Home / Self Care | Payer: Commercial Managed Care - HMO | Source: Ambulatory Visit | Attending: Family Medicine | Admitting: Family Medicine

## 2021-08-16 DIAGNOSIS — N644 Mastodynia: Secondary | ICD-10-CM | POA: Diagnosis not present

## 2021-09-11 ENCOUNTER — Telehealth: Payer: Commercial Managed Care - HMO

## 2021-09-11 ENCOUNTER — Telehealth: Payer: Self-pay | Admitting: Licensed Clinical Social Worker

## 2021-09-11 NOTE — Telephone Encounter (Signed)
°  Care Management   Follow Up Note   09/11/2021 Name: [name] MRN: [id number] DOB: [birth year]   Referred by: Holley Bouche, MD Reason for referral : No chief complaint on file.   An unsuccessful telephone outreach was attempted today. The patient was referred to the case management team for assistance with care management and care coordination.   Follow Up Plan: The care management team will reach out to the patient again over the next 7 days.   Milus Height, [place]  Social Worker [facility] Care Management  [phone]

## 2021-09-30 ENCOUNTER — Other Ambulatory Visit: Payer: Self-pay | Admitting: *Deleted

## 2021-09-30 DIAGNOSIS — E1169 Type 2 diabetes mellitus with other specified complication: Secondary | ICD-10-CM

## 2021-10-01 ENCOUNTER — Other Ambulatory Visit: Payer: Self-pay | Admitting: Student

## 2021-10-01 MED ORDER — ATORVASTATIN CALCIUM 80 MG PO TABS: 80.0000 mg | ORAL_TABLET | Freq: Every day | ORAL | 2 refills | Status: DC

## 2021-10-02 ENCOUNTER — Encounter: Payer: Self-pay | Admitting: Student

## 2021-10-02 ENCOUNTER — Other Ambulatory Visit: Payer: Self-pay

## 2021-10-02 ENCOUNTER — Ambulatory Visit (INDEPENDENT_AMBULATORY_CARE_PROVIDER_SITE_OTHER): Payer: Medicare Other | Admitting: Student

## 2021-10-02 VITALS — BP 162/88 | HR 75 | Ht 61.0 in | Wt 230.8 lb

## 2021-10-02 DIAGNOSIS — Z1211 Encounter for screening for malignant neoplasm of colon: Secondary | ICD-10-CM

## 2021-10-02 DIAGNOSIS — E119 Type 2 diabetes mellitus without complications: Secondary | ICD-10-CM

## 2021-10-02 DIAGNOSIS — Z8639 Personal history of other endocrine, nutritional and metabolic disease: Secondary | ICD-10-CM | POA: Diagnosis not present

## 2021-10-02 DIAGNOSIS — E1140 Type 2 diabetes mellitus with diabetic neuropathy, unspecified: Secondary | ICD-10-CM | POA: Diagnosis not present

## 2021-10-02 DIAGNOSIS — E785 Hyperlipidemia, unspecified: Secondary | ICD-10-CM

## 2021-10-02 DIAGNOSIS — E1169 Type 2 diabetes mellitus with other specified complication: Secondary | ICD-10-CM | POA: Diagnosis not present

## 2021-10-02 DIAGNOSIS — G8929 Other chronic pain: Secondary | ICD-10-CM | POA: Diagnosis not present

## 2021-10-02 DIAGNOSIS — Z794 Long term (current) use of insulin: Secondary | ICD-10-CM

## 2021-10-02 DIAGNOSIS — I10 Essential (primary) hypertension: Secondary | ICD-10-CM | POA: Diagnosis not present

## 2021-10-02 DIAGNOSIS — E1142 Type 2 diabetes mellitus with diabetic polyneuropathy: Secondary | ICD-10-CM

## 2021-10-02 DIAGNOSIS — M545 Low back pain, unspecified: Secondary | ICD-10-CM

## 2021-10-02 MED ORDER — HYDROCHLOROTHIAZIDE 25 MG PO TABS: 12.5000 mg | ORAL_TABLET | Freq: Every day | ORAL | 3 refills | Status: DC

## 2021-10-02 MED ORDER — LISINOPRIL 40 MG PO TABS: 40.0000 mg | ORAL_TABLET | Freq: Every day | ORAL | 2 refills | Status: DC

## 2021-10-02 MED ORDER — OZEMPIC (0.25 OR 0.5 MG/DOSE) 2 MG/1.5ML ~~LOC~~ SOPN: PEN_INJECTOR | SUBCUTANEOUS | 0 refills | Status: DC

## 2021-10-02 MED ORDER — EMPAGLIFLOZIN 25 MG PO TABS: 25.0000 mg | ORAL_TABLET | Freq: Every day | ORAL | 3 refills | Status: DC

## 2021-10-02 MED ORDER — RIVAROXABAN 20 MG PO TABS: 20.0000 mg | ORAL_TABLET | Freq: Every day | ORAL | 3 refills | Status: DC

## 2021-10-02 MED ORDER — GABAPENTIN 300 MG PO CAPS: 900.0000 mg | ORAL_CAPSULE | Freq: Three times a day (TID) | ORAL | 0 refills | Status: DC

## 2021-10-02 NOTE — Assessment & Plan Note (Signed)
Back pain continues to be an issue affecting her walking. She's having issues balancing with her cane and feels she needs to take breaks while walking to rest her back/lean against something for her balance. She's having issues walking long distances with cane and would benefit from a rollator where she could rest as needed. Will also provide better balance and stability than cane. Patient hasn't had imaging of back for over 3 years. Will obtain back and hip imaging. Will place DME order for Rollator ?-Rollator ?-X-ray hip ?-X-ray lumbar back ?

## 2021-10-02 NOTE — Assessment & Plan Note (Signed)
Patient had A1c checked in January, reports it was 11. Patient also not been compliant with all meds, as she's lost them several times with changing home situation.  ?-Refill home meds ?-Continue home meds ?-Recheck A1c in 2 months ?-Optho exam ?

## 2021-10-02 NOTE — Progress Notes (Signed)
?SUBJECTIVE:  ? ?CHIEF COMPLAINT / HPI:  ? ?Discuss rolling walker ?Used friends rolling walker and had good support and rest when she needed, was using around august. She was weighing 225 and doing better then. Weight is going up now. Issue with cane is that when back starts to hurt and she needs to take a break, she can't sit or rest/lean against anything. Wanting rolling wheelchair with seat on it. Finds she's bent over in pain from the cain, and can't last long. 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 Pain been an issue for 20 years.  ? ?Also has hip pain, fell 2nd week in January from seated position, while trying to grab grandkids running around. Pain has continued since.  ? ?Needs refill with gabapentin and Jardiance. Lost her jardiance, needs refill of ozempic, xarlelto  ? ?Social situation: Homelessness previously, is living with her daughter for last 2 months and enjoying it better. Hoping for more stability.  ? ?HTN ?BP today 160/80 ?Medications: Lisinopril 40, amlodipine 10, HCTZ 12.5  ?Compliance: Did not take meds today.  ?Symptoms: Chest pain, SOB, blurry vision, headache, light headedness.  ? ?DM ?-HgbA1c today checked in January with home health nurse. Was 10.  ?Meds: Jardiance 25, Lispro 33 units, Lantus 25 units daily, ozempic 0.5 mg weekly, gabapentin 900 mg, TID ?Compliance: ?Symptoms: Headaches, blurry vision, increased urination/urgency ? ?Colonoscopy ?Patient open to getting, has been avoiding for  years she states. ? ?Optho exam ?Patient feels appointment is about due ? ?PERTINENT  PMH / PSH: A. Fib, HTN, DM, ? ?Past Medical History:  ?Diagnosis Date  ? Atrial fibrillation ([place])   ? 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 Diabetes mellitus   ? type 2  ? Diabetes mellitus out of control 06/12/2009  ? [facility] Ophthalmology 02/13/14 - Early cataracts OD, no diabetic retinopathy, f/u in 12 months; Dr. Claudean Kinds.    ? Hypercholesteremia   ? Hypertension   ? Hypertension   ? Hypertensive urgency 05/2006  ? TIA (transient ischemic attack) 2017  ? ? ?Past Surgical History:  ?Procedure Laterality Date  ? ARTERY BIOPSY Right 09/18/2017  ? Procedure: BIOPSY OF RIGHT TEMPORAL ARTERY;  Surgeon: Coralie Keens, MD;  Location: [facility];  Service: General;  Laterality: Right;  ? DILATION AND CURETTAGE OF UTERUS  1976  ? MENISCUS REPAIR  03/09  ? right knee  ? TONSILECTOMY, ADENOIDECTOMY, BILATERAL MYRINGOTOMY AND TUBES  age 26  ? TONSILLECTOMY    ? ? ?OBJECTIVE:  ?BP (!) 162/88   Pulse 75   Ht 5\' 1"  (1.549 m)   Wt 104.7 kg   SpO2 100%   BMI 43.61 kg/m?  ? ?General: NAD, pleasant, able to participate in exam ?Cardiac: RRR, no murmurs auscultated. ?Respiratory: CTAB, normal effort, no wheezes, rales or rhonchi ?Abdomen: soft, non-tender, non-distended, normoactive bowel sounds ?MSK: Gait normal and balanced with cane, bent over deeply at waist. Back ROM limited, limited to slightly turning left or right, TTP overlying spine, paraspinal muscles NTTP. Patient with difficulties extending back/looking up ?Extremities: warm and well perfused, no edema or cyanosis. ?Skin: warm and dry, no rashes noted ?Neuro: alert, no obvious focal deficits, speech normal ?Psych: Normal affect and mood ? ?ASSESSMENT/PLAN:  ?Essential hypertension ?BP elevated today, but okay on last visit. Will continue to monitor and recheck in 1 month. Patient often lost to follow up for BP goals ?-Continue home meds ?-  Continue to monitor ? ? ?Uncontrolled diabetes mellitus with complication, with long-term current use of insulin ([place]) ?Patient had A1c checked in January, reports it was 11. Patient also not been compliant with all meds, as she's lost them several times with changing home situation.  ?-Refill home meds ?-Continue home meds ?-Recheck A1c in 2 months ?-Optho exam ? ?Chronic midline low back pain ?Back pain continues to be an issue affecting her walking.  She's having issues balancing with her cane and feels she needs to take breaks while walking to rest her back/lean against something for her balance. She's having issues walking long distances with cane and would benefit from a rollator where she could rest as needed. Will also provide better balance and stability than cane. Patient hasn't had imaging of back for over 3 years. Will obtain back and hip imaging. Will place DME order for Rollator ?-Rollator ?-X-ray hip ?-X-ray lumbar back ? ?Diabetes mellitus type 2, insulin dependent ([place]) ?Patient with diabetes, had A1c checked in January that was 11. Patient taking home meds inconsistently due to homelessness. Patient continues to relocate and loose meds in move. Patient also often lost to follow up. Will refill diabetes meds and follow up in 2 months for A1C check. ?-Continue home meds: Jardiance 25, Lispro 33, Lantus 25, Ozempic 0.5 mg weekly ?-Refill home meds ?-Follow up in 2 months for A1C ?-Referral for Opthalmology exam ? ?Health Maintenance: ?Colonoscopy referal to GI placed ?  ?Orders Placed This Encounter  ?Procedures  ? For home use only DME 4 wheeled rolling walker with seat ([id number])  ?  Rollator Walker  ?  Order Specific Question:   Patient needs a walker to treat with the following condition  ?  Answer:   Back pain [626948]  ?  Order Specific Question:   Patient needs a walker to treat with the following condition  ?  Answer:   Balance problem [546270]  ?  Order Specific Question:   Patient needs a walker to treat with the following condition  ?  Answer:   Hip pain [350093]  ? DG Arthro Hip Right  ?  Standing Status:   Future  ?  Standing Expiration Date:   10/01/2022  ?  Order Specific Question:   If indicated for the ordered procedure, I authorize the administration of contrast media per Radiology protocol  ?  Answer:   Yes  ?  Order Specific Question:   Reason for Exam (SYMPTOM  OR DIAGNOSIS REQUIRED)  ?  Answer:   Fall with hip pain  ?  Order  Specific Question:   Preferred Imaging Location?  ?  Answer:   [facility]  ? DG Lumbar Spine 1 View  ?  Standing Status:   Future  ?  Standing Expiration Date:   10/01/2022  ?  Order Specific Question:   Reason for Exam (SYMPTOM  OR DIAGNOSIS REQUIRED)  ?  Answer:   Lower back pain  ?  Order Specific Question:   Preferred imaging location?  ?  Answer:   [facility]  ? Ambulatory referral to Gastroenterology  ?  Referral Priority:   Routine  ?  Referral Type:   Consultation  ?  Referral Reason:   Specialty Services Required  ?  Number of Visits Requested:   1  ? Ambulatory referral to Ophthalmology  ?  Referral Priority:   Routine  ?  Referral Type:   Consultation  ?  Referral Reason:   Specialty Services Required  ?  Requested Specialty:   Ophthalmology  ?  Number of Visits Requested:   1  ? ?Meds ordered this encounter  ?Medications  ? hydrochlorothiazide (HYDRODIURIL) 25 MG tablet  ?  Sig: Take 0.5 tablets (12.5 mg total) by mouth daily.  ?  Dispense:  90 tablet  ?  Refill:  3  ? lisinopril (ZESTRIL) 40 MG tablet  ?  Sig: Take 1 tablet (40 mg total) by mouth daily.  ?  Dispense:  90 tablet  ?  Refill:  2  ?  This prescription was filled on 04/25/2021. Any refills authorized will be placed on file.  ? rivaroxaban (XARELTO) 20 MG TABS tablet  ?  Sig: Take 1 tablet (20 mg total) by mouth daily with supper.  ?  Dispense:  90 tablet  ?  Refill:  3  ? empagliflozin (JARDIANCE) 25 MG TABS tablet  ?  Sig: Take 1 tablet (25 mg total) by mouth daily.  ?  Dispense:  90 tablet  ?  Refill:  3  ? gabapentin (NEURONTIN) 300 MG capsule  ?  Sig: Take 3 capsules (900 mg total) by mouth 3 (three) times daily.  ?  Dispense:  270 capsule  ?  Refill:  0  ? Semaglutide,0.25 or 0.5MG /DOS, (OZEMPIC, 0.25 OR 0.5 MG/DOSE,) 2 MG/1.5ML SOPN  ?  Sig: Inject 0.5 mg weekly.  ?  Dispense:  1.5 mL  ?  Refill:  0  ?  Order Specific Question:   Lot Number?  ?  Answer:   [id number]  ?  Order Specific Question:   Expiration Date?  ?  Answer:    09/03/2022  ?  Order Specific Question:   Quantity  ?  Answer:   1  ? ?No follow-ups on file. ?@SIGNNOTE @ ? ?

## 2021-10-02 NOTE — Patient Instructions (Addendum)
It was great to see you! Thank you for allowing me to participate in your care! ? ?I recommend that you always bring your medications to each appointment as this makes it easy to ensure we are on the correct medications and helps [place] not miss when refills are needed. ? ?Our plans for today:  ?- Rolling Walker referral placed ?- HTN, re-check in 1 month ?- Diabetes - re-check in 2 months ?- X-ray Hip and Lower Back -  Get imaging at your convenience ?     [facility] ?     [place] ? [phone]) [phone] ?- Screening: Colonoscopy, Opthalmology for Eye Exam ?-  Please make follow up appointment in 1 month to discuss wheel chair, diabetes, and blood pressure. ? ? ?Take care and seek immediate care sooner if you develop any concerns.  ? ?Dr. Holley Bouche, MD ?[facility] ? ?

## 2021-10-02 NOTE — Assessment & Plan Note (Signed)
BP elevated today, but okay on last visit. Will continue to monitor and recheck in 1 month. Patient often lost to follow up for BP goals ?-Continue home meds ?-Continue to monitor ? ?

## 2021-10-02 NOTE — Assessment & Plan Note (Addendum)
>>  ASSESSMENT AND PLAN FOR DIABETES MELLITUS TYPE 2, INSULIN DEPENDENT ([place]) WRITTEN ON 10/02/2021  2:43 PM BY Holley Bouche, MD  Patient with diabetes, had A1c checked in January that was 11. Patient taking home meds inconsistently due to homelessness. Patient continues to relocate and loose meds in move. Patient also often lost to follow up. Will refill diabetes meds and follow up in 2 months for A1C check. -Continue home meds: Jardiance 25, Lispro 33, Lantus 25, Ozempic 0.5 mg weekly -Refill home meds -Follow up in 2 months for A1C -Referral for Opthalmology exam  >>ASSESSMENT AND PLAN FOR UNCONTROLLED DIABETES MELLITUS WITH COMPLICATION, WITH LONG-TERM CURRENT USE OF INSULIN WRITTEN ON 10/02/2021  1:45 PM BY Holley Bouche, MD  Patient had A1c checked in January, reports it was 11. Patient also not been compliant with all meds, as she's lost them several times with changing home situation.  -Refill home meds -Continue home meds -Recheck A1c in 2 months -Optho exam

## 2021-10-03 ENCOUNTER — Telehealth: Payer: Self-pay

## 2021-10-03 DIAGNOSIS — G8929 Other chronic pain: Secondary | ICD-10-CM | POA: Diagnosis not present

## 2021-10-03 DIAGNOSIS — M545 Low back pain, unspecified: Secondary | ICD-10-CM | POA: Diagnosis not present

## 2021-10-03 NOTE — Telephone Encounter (Signed)
Community message sent to [employer] for DME rollator walker.  ? ?Will await response.  ? ?Talbot Grumbling, RN ? ?

## 2021-10-11 ENCOUNTER — Ambulatory Visit
Admission: RE | Admit: 2021-10-11 | Discharge: 2021-10-11 | Disposition: A | Discharge status: Home / Self Care | Payer: Medicare Other | Source: Ambulatory Visit | Attending: Family Medicine | Admitting: Family Medicine

## 2021-10-11 ENCOUNTER — Other Ambulatory Visit: Payer: Self-pay | Admitting: Student

## 2021-10-11 ENCOUNTER — Other Ambulatory Visit: Payer: Self-pay

## 2021-10-11 DIAGNOSIS — M25551 Pain in right hip: Secondary | ICD-10-CM | POA: Diagnosis not present

## 2021-10-11 DIAGNOSIS — M4316 Spondylolisthesis, lumbar region: Secondary | ICD-10-CM | POA: Diagnosis not present

## 2021-10-11 DIAGNOSIS — M545 Low back pain, unspecified: Secondary | ICD-10-CM | POA: Diagnosis not present

## 2021-10-23 NOTE — Telephone Encounter (Signed)
Was received and processed. [name], CMA ? ?

## 2021-10-30 ENCOUNTER — Other Ambulatory Visit: Payer: Self-pay | Admitting: Student

## 2021-11-12 ENCOUNTER — Other Ambulatory Visit: Payer: Self-pay | Admitting: Student

## 2021-11-12 DIAGNOSIS — E1140 Type 2 diabetes mellitus with diabetic neuropathy, unspecified: Secondary | ICD-10-CM

## 2021-11-12 DIAGNOSIS — E1142 Type 2 diabetes mellitus with diabetic polyneuropathy: Secondary | ICD-10-CM

## 2021-11-13 ENCOUNTER — Other Ambulatory Visit: Payer: Self-pay | Admitting: *Deleted

## 2021-11-13 DIAGNOSIS — E1169 Type 2 diabetes mellitus with other specified complication: Secondary | ICD-10-CM

## 2021-11-13 MED ORDER — LANTUS SOLOSTAR 100 UNIT/ML ~~LOC~~ SOPN: 25.0000 [IU] | PEN_INJECTOR | Freq: Every day | SUBCUTANEOUS | 0 refills | Status: DC

## 2021-11-13 MED ORDER — INSULIN LISPRO (1 UNIT DIAL) 100 UNIT/ML (KWIKPEN): 25.0000 [IU] | PEN_INJECTOR | Freq: Every day | SUBCUTANEOUS | 11 refills | Status: DC

## 2021-11-15 ENCOUNTER — Ambulatory Visit: Payer: Medicare Other | Admitting: Student

## 2021-11-15 NOTE — Patient Instructions (Incomplete)
It was great to see you! Thank you for allowing me to participate in your care! ? ?I recommend that you always bring your medications to each appointment as this makes it easy to ensure we are on the correct medications and helps [place] not miss when refills are needed. ? ?Our plans for today:  ?- Blood Pressure Check ?-  ? ?We are checking some labs today, I will call you if they are abnormal will send you a MyChart message or a letter if they are normal.  If you do not hear about your labs in the next 2 weeks please let us know.*** ? ?Take care and seek immediate care sooner if you develop any concerns.  ? ?Dr. Holley Bouche, MD ?[place] ? ?

## 2021-11-15 NOTE — Progress Notes (Deleted)
?  SUBJECTIVE:  ? ?CHIEF COMPLAINT / HPI:  ? ?BP check ?BP today: ?Meds: Amlodipine 10 mg, HCTZ 12.5 mg, lisinopril 40 mg, Jardiance 25 mg ?Symptoms: ?Compliance: ?BMP ? ?DM ?Meds: Ozempic 0.5 mg wkly, lantus 25 daily,   ?Compliance: ?Symptoms: ?Sugar checks: ?HgbA1c ? ?Gabapentin dosing ?Symptoms ?Compliance ?BMP ? ?HLD ?Statin ? ?Colonoscopy reminder ? ?PERTINENT  PMH / PSH: HTN, DM2, HLD ? ?Past Medical History:  ?Diagnosis Date  ? Atrial fibrillation ([place])   ? 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 Diabetes mellitus   ? type 2  ? Diabetes mellitus out of control 06/12/2009  ? [facility] Ophthalmology 02/13/14 - Early cataracts OD, no diabetic retinopathy, f/u in 12 months; Dr. Claudean Kinds.   ? Hypercholesteremia   ? Hypertension   ? Hypertension   ? Hypertensive urgency 05/2006  ? TIA (transient ischemic attack) 2017  ? ? ?Past Surgical History:  ?Procedure Laterality Date  ? ARTERY BIOPSY Right 09/18/2017  ? Procedure: BIOPSY OF RIGHT TEMPORAL ARTERY;  Surgeon: Coralie Keens, MD;  Location: [facility];  Service: General;  Laterality: Right;  ? DILATION AND CURETTAGE OF UTERUS  1976  ? MENISCUS REPAIR  03/09  ? right knee  ? TONSILECTOMY, ADENOIDECTOMY, BILATERAL MYRINGOTOMY AND TUBES  age 8  ? TONSILLECTOMY    ? ? ?OBJECTIVE:  ?There were no vitals taken for this visit. ? ?General: NAD, pleasant, able to participate in exam ?Cardiac: RRR, no murmurs auscultated. ?Respiratory: CTAB, normal effort, no wheezes, rales or rhonchi ?Abdomen: soft, non-tender, non-distended, normoactive bowel sounds ?Extremities: warm and well perfused, no edema or cyanosis. ?Skin: warm and dry, no rashes noted ?Neuro: alert, no obvious focal deficits, speech normal ?Psych: Normal affect and mood ? ?ASSESSMENT/PLAN:  ?No problem-specific Assessment & Plan notes found for this encounter. ?  ?No orders of the defined types were placed in this encounter. ? ?No orders of the defined types were placed in this  encounter. ? ?No follow-ups on file. ?'@SIGNNOTE'$ @ ?{    This will disappear when note is signed, click to select method of visit    :1} ?

## 2021-12-13 ENCOUNTER — Encounter: Payer: Self-pay | Admitting: Nurse Practitioner

## 2021-12-17 ENCOUNTER — Other Ambulatory Visit: Payer: Self-pay | Admitting: Student

## 2021-12-17 DIAGNOSIS — E1142 Type 2 diabetes mellitus with diabetic polyneuropathy: Secondary | ICD-10-CM

## 2021-12-17 DIAGNOSIS — E1140 Type 2 diabetes mellitus with diabetic neuropathy, unspecified: Secondary | ICD-10-CM

## 2022-01-01 ENCOUNTER — Telehealth: Payer: Self-pay | Admitting: *Deleted

## 2022-01-01 ENCOUNTER — Encounter: Payer: Self-pay | Admitting: Nurse Practitioner

## 2022-01-01 ENCOUNTER — Ambulatory Visit (INDEPENDENT_AMBULATORY_CARE_PROVIDER_SITE_OTHER): Payer: Medicare Other | Admitting: Nurse Practitioner

## 2022-01-01 VITALS — BP 157/80 | HR 76 | Ht 61.0 in | Wt 231.0 lb

## 2022-01-01 DIAGNOSIS — Z01818 Encounter for other preprocedural examination: Secondary | ICD-10-CM | POA: Diagnosis not present

## 2022-01-01 DIAGNOSIS — Z1211 Encounter for screening for malignant neoplasm of colon: Secondary | ICD-10-CM

## 2022-01-01 DIAGNOSIS — Z7901 Long term (current) use of anticoagulants: Secondary | ICD-10-CM | POA: Diagnosis not present

## 2022-01-01 MED ORDER — NA SULFATE-K SULFATE-MG SULF 17.5-3.13-1.6 GM/177ML PO SOLN
1.0000 | Freq: Once | ORAL | 0 refills | Status: AC
Start: 2022-01-01 — End: 2022-01-01

## 2022-01-01 NOTE — Progress Notes (Signed)
Assessment   Patient profile:  [name] is a 66 y.o. female with a past medical history significant for CVA , DM, HTN, obesity.  See PMH below for any additional history. Patient is new to the practice.   Colon cancer screening.  No alarm features. No [place] of colon cancer. Not anemic.   Type II DM, sounds to be not well controlled.  She reports blood sugars at home in upper 200's. No recent hgb A1c. On Jardiance, insulin and Ozempic.   Afib / chronic anticoagulation, on Xarelto  Plan   Schedule for a colonoscopy. The risks and benefits of colonoscopy with possible polypectomy / biopsies were discussed and the patient agrees to proceed.  Hold Xarelto for 2 days before procedure - will instruct when and how to resume after procedure. Patient understands that there is a low but real risk of cardiovascular event such as heart attack, stroke, or embolism /  thrombosis while off blood thinner. The patient consents to proceed. Will communicate by phone or EMR with patient's prescribing provider to confirm that holding Xarelto is reasonable in this case.    History of Present Illness   Chief complaint: none. Discuss colonoscopy   Referred by PCP for colon cancer screening.   Patient is here alone. She had a colonoscopy in 2006, ? Maybe done here but report not in Epic.No colonoscopy or other colon cancer screening since. No [place] of colon cancer. No blood in stool. No bowel changes. She has lost weight since starting Ozempic.   Ambulates with walker.   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 Claudean Kinds.    Hypercholesteremia    Hypertension    Hypertension    Hypertensive urgency  05/2006   Stroke (cerebrum) ([place])    TIA (transient ischemic attack) 2017   Past Surgical History:  Procedure Laterality Date   ARTERY BIOPSY Right 09/18/2017   Procedure: BIOPSY OF RIGHT TEMPORAL ARTERY;  Surgeon: Coralie Keens, MD;  Location: [place];  Service: General;  Laterality: Right;   [place]  03/09   right knee   TONSILECTOMY, ADENOIDECTOMY, BILATERAL MYRINGOTOMY AND TUBES  age 37   TONSILLECTOMY     Family History  Problem Relation Age of Onset   Heart failure Mother    Diabetes Father    Hypertension Father    Cancer Father    Lung cancer Sister    Colon polyps Sister    Hypertension Sister    Hypertension Sister    Hypertension Sister    Hypertension Sister    Hypertension Sister    Hypertension Sister    Stroke Brother    Diabetes Brother    Stroke Maternal Grandmother    Heart disease Maternal Grandfather    Stomach cancer Neg Hx    Esophageal cancer Neg Hx    Colon cancer Neg Hx    Social History   Tobacco Use   Smoking status: Never   Smokeless tobacco: Never  Vaping Use   Vaping Use: Never used  Substance Use Topics   Alcohol use: No   Drug use: No  Current Outpatient Medications  Medication Sig Dispense Refill   acetaminophen (TYLENOL) 500 MG tablet Take 1,000 mg by mouth every 6 (six) hours as needed for moderate pain.     amLODipine (NORVASC) 10 MG tablet Take 1 tablet (10 mg total) by mouth daily. 90 tablet 3   atorvastatin (LIPITOR) 80 MG tablet Take 1 tablet (80 mg total) by mouth daily. (Cholesterol) 90 tablet 2   Blood Glucose Monitoring Suppl ([place]) w/Device KIT Check blood sugar 3 times per day. 1 kit 0   clobetasol ointment (TEMOVATE) 0.86 % Apply 1 application topically 2 (two) times daily. 60 g 0   Continuous Blood Gluc Receiver (DEXCOM G6 RECEIVER) DEVI 1 kit by Does not apply route as directed. 1 each 2   Continuous Blood Gluc Sensor (DEXCOM G6 SENSOR) MISC  Inject 3 Devices into the skin as directed. 3 each 11   Continuous Blood Gluc Transmit (DEXCOM G6 TRANSMITTER) MISC Inject 1 Device into the skin as directed. 1 each 3   diclofenac Sodium (VOLTAREN) 1 % GEL Apply 2 g topically 4 (four) times daily. 100 g 0   empagliflozin (JARDIANCE) 25 MG TABS tablet Take 1 tablet (25 mg total) by mouth daily. 90 tablet 3   gabapentin (NEURONTIN) 300 MG capsule Take 3 capsules (900 mg total) by mouth 3 (three) times daily. 270 capsule 2   glucose blood (ONETOUCH VERIO) test strip use to test three times daily 1000 strip 11   GNP ULTICARE PEN NEEDLES 31G X 8 MM MISC 1 applicator by Does not apply route 4 (four) times daily. 100 each 3   hydrochlorothiazide (HYDRODIURIL) 25 MG tablet Take 0.5 tablets (12.5 mg total) by mouth daily. 90 tablet 3   Icosapent Ethyl 0.5 g CAPS Take 1 capsule by mouth 2 (two) times daily. 120 capsule 3   insulin glargine (LANTUS SOLOSTAR) 100 UNIT/ML Solostar Pen Inject 25 Units into the skin daily. 120 mL 0   insulin lispro (HUMALOG) 100 UNIT/ML KwikPen Inject 25 Units into the skin daily. 15 mL 11   lidocaine (LIDODERM) 5 % Place 1 patch onto the skin daily. Remove & Discard patch within 12 hours or as directed by MD 30 patch 0   lisinopril (ZESTRIL) 40 MG tablet Take 1 tablet (40 mg total) by mouth daily. 90 tablet 2   OneTouch Delica Lancets 57Q MISC Check blood sugar 3 times daily 100 each 11   rivaroxaban (XARELTO) 20 MG TABS tablet Take 1 tablet (20 mg total) by mouth daily with supper. 90 tablet 3   Semaglutide,0.25 or 0.5MG/DOS, (OZEMPIC, 0.25 OR 0.5 MG/DOSE,) 2 MG/1.5ML SOPN Inject 0.5 mg weekly. 1.5 mL 0   traMADol (ULTRAM) 50 MG tablet Take 1 tablet (50 mg total) by mouth daily as needed. 30 tablet 2   No current facility-administered medications for this visit.   Allergies  Allergen Reactions   Propoxyphene N-Acetaminophen Hives, Itching, Swelling and Other (See Comments)    REACTION: Hallucinations   Glipizide Nausea  And Vomiting   Metformin And Related Diarrhea    Review of Systems: Positive for back pain, depression, sleeping problems.  All other systems reviewed and negative except where noted in HPI.   Physical Exam   Wt Readings from Last 3 Encounters:  01/01/22 231 lb (104.8 kg)  10/02/21 230 lb 12.8 oz (104.7 kg)  06/24/21 229 lb (103.9 kg)    BP (!) 157/80   Pulse 76   Ht '5\' 1"'  (1.549 m)  Wt 231 lb (104.8 kg)   BMI 43.65 kg/m  Constitutional:  Pleasant obese female in no acute distress. Psychiatric: Pleasant. Normal mood and affect. Behavior is normal. EENT: Pupils normal.  Conjunctivae are normal. No scleral icterus. Neck supple.  Cardiovascular: Normal rate, regular rhythm. No edema Pulmonary/chest: Effort normal and breath sounds normal. No wheezing, rales or rhonchi. Abdominal: Exam done with patient in chair. Abdomen is soft, nondistended, nontender. Bowel sounds active throughout.  Neurological: Alert and oriented to person place and time. Skin: Skin is warm and dry. No rashes noted.  Tye Savoy, NP  01/01/2022, 11:47 AM  Cc:  Referring Provider Holley Bouche, MD

## 2022-01-01 NOTE — Telephone Encounter (Signed)
[facility] Pre-operative Risk Assessment     Request for surgical clearance:     Endoscopy Procedure  What type of surgery is being performed?     Colonoscopy   When is this surgery scheduled?     01/31/2022  What type of clearance is required ?   Pharmacy  Are there any medications that need to be held prior to surgery and how long? Xarelto   Practice name and name of physician performing surgery?      [facility]  What is your office phone and fax number?      Phone- [phone]  [phone]  Anesthesia type (None, local, MAC, general) ?       MAC

## 2022-01-01 NOTE — Patient Instructions (Signed)
You have been scheduled for a colonoscopy. Please follow written instructions given to you at your visit today.  Please pick up your prep supplies at the pharmacy within the next 1-3 days. If you use inhalers (even only as needed), please bring them with you on the day of your procedure.   You will be contacted by our office prior to your procedure for directions on holding your Xarelto.  If you do not hear from our office 1 week prior to your scheduled procedure, please call [phone] to discuss.  Due to recent changes in healthcare laws, you may see the results of your imaging and laboratory studies on MyChart before your provider has had a chance to review them.  We understand that in some cases there may be results that are confusing or concerning to you. Not all laboratory results come back in the same time frame and the provider may be waiting for multiple results in order to interpret others.  Please give [place] 48 hours in order for your provider to thoroughly review all the results before contacting the office for clarification of your results.    If you are age 42 or older, your body mass index should be between 23-30. Your Body mass index is 43.65 kg/m. If this is out of the aforementioned range listed, please consider follow up with your Primary Care Provider.  If you are age 52 or younger, your body mass index should be between 19-25. Your Body mass index is 43.65 kg/m. If this is out of the aformentioned range listed, please consider follow up with your Primary Care Provider.   ________________________________________________________  The [facility] providers would like to encourage you to use [facility] to communicate with providers for non-urgent requests or questions.  Due to long hold times on the telephone, sending your provider a message by [facility] may be a faster and more efficient way to get a response.  Please allow 48 business hours for a response.  Please remember that this is  for non-urgent requests.  _______________________________________________________   I appreciate the  opportunity to care for you  Thank You   [place]

## 2022-01-01 NOTE — Progress Notes (Signed)
I agree with the above note, plan 

## 2022-01-23 NOTE — Telephone Encounter (Signed)
Spoke with patient to see if she has heard from her PCP about her Xarelto. She has not. They have not informed me or sent back telephone encounter. I will have to contact their office   Patient had questions about her prep instructions. Went over them with her again. She said her son has her instructions I told her she needs to get them back from him and call our  office with questions  She is also a diabetic on Insulin

## 2022-01-24 ENCOUNTER — Encounter: Payer: Self-pay | Admitting: Gastroenterology

## 2022-01-28 ENCOUNTER — Telehealth: Payer: Self-pay | Admitting: Student

## 2022-01-28 NOTE — Telephone Encounter (Signed)
Called and spoke with patient about stopping Xarelto prior to colonoscopy on 6/30. Patient to stop medicine day before procedure on 6/29 and to restart medicine the day after on 7/1. Patient understood and agreeable to medication changes.   Bess Kinds, MD PGY-1 [facility]

## 2022-01-31 ENCOUNTER — Encounter: Payer: Self-pay | Admitting: Gastroenterology

## 2022-01-31 ENCOUNTER — Ambulatory Visit (AMBULATORY_SURGERY_CENTER): Payer: Medicare Other | Admitting: Gastroenterology

## 2022-01-31 VITALS — BP 167/88 | HR 80 | Temp 96.2°F | Resp 13 | Ht 61.0 in | Wt 231.0 lb

## 2022-01-31 DIAGNOSIS — D123 Benign neoplasm of transverse colon: Secondary | ICD-10-CM

## 2022-01-31 DIAGNOSIS — Z1211 Encounter for screening for malignant neoplasm of colon: Secondary | ICD-10-CM

## 2022-01-31 DIAGNOSIS — E119 Type 2 diabetes mellitus without complications: Secondary | ICD-10-CM | POA: Diagnosis not present

## 2022-01-31 MED ORDER — SODIUM CHLORIDE 0.9 % IV SOLN: 500.0000 mL | Freq: Once | INTRAVENOUS | Status: DC

## 2022-01-31 NOTE — Progress Notes (Signed)
Cell phone off per pt   854- BP checked 235/112  J Monday CRNA aware.  Pt denies headache or discomfort, states "I am just scared"    BP 181/102

## 2022-01-31 NOTE — Op Note (Addendum)
[place] Patient Name: [name] Procedure Date: 01/31/2022 8:32 AM MRN: [id number] Endoscopist: Milus Banister , MD Age: 66 Referring MD:  Date of Birth: 1956-07-04 Gender: Female Account #: [id number] Procedure:                Colonoscopy Indications:              Screening for colorectal malignant neoplasm Medicines:                Monitored Anesthesia Care Procedure:                Pre-Anesthesia Assessment:                           - Prior to the procedure, a History and Physical                            was performed, and patient medications and                            allergies were reviewed. The patient's tolerance of                            previous anesthesia was also reviewed. The risks                            and benefits of the procedure and the sedation                            options and risks were discussed with the patient.                            All questions were answered, and informed consent                            was obtained. Prior Anticoagulants: The patient has                            taken Xarelto (rivaroxaban), last dose was 2 days                            prior to procedure. ASA Grade Assessment: III - A                            patient with severe systemic disease. After                            reviewing the risks and benefits, the patient was                            deemed in satisfactory condition to undergo the                            procedure.  After obtaining informed consent, the colonoscope                            was passed under direct vision. Throughout the                            procedure, the patient's blood pressure, pulse, and                            oxygen saturations were monitored continuously. The                            Olympus CF-HQ190L [id number]) Colonoscope was                            introduced through the anus and advanced to the the                             cecum, identified by appendiceal orifice and                            ileocecal valve. The colonoscopy was performed                            without difficulty. The patient tolerated the                            procedure well. The quality of the bowel                            preparation was good. The ileocecal valve,                            appendiceal orifice, and rectum were photographed. Scope In: 9:12:56 AM Scope Out: 9:31:48 AM Scope Withdrawal Time: 0 hours 8 minutes 58 seconds  Total Procedure Duration: 0 hours 18 minutes 52 seconds  Findings:                 A 3 mm polyp was found in the transverse colon. The                            polyp was sessile. The polyp was removed with a                            cold snare. Resection and retrieval were complete.                           The exam was otherwise without abnormality on                            direct and retroflexion views. Complications:            No immediate complications. Estimated blood loss:  None. Estimated Blood Loss:     Estimated blood loss: none. Impression:               - One 3 mm polyp in the transverse colon, removed                            with a cold snare. Resected and retrieved.                           - The examination was otherwise normal on direct                            and retroflexion views. Recommendation:           - Patient has a contact number available for                            emergencies. The signs and symptoms of potential                            delayed complications were discussed with the                            patient. Return to normal activities tomorrow.                            Written discharge instructions were provided to the                            patient.                           - Resume previous diet.                           - Continue present medications. You can resume  your                            blood thinner tomorrow.                           - Await pathology results. Milus Banister, MD 01/31/2022 9:33:52 AM This report has been signed electronically.

## 2022-01-31 NOTE — Progress Notes (Signed)
Called to room to assist during endoscopic procedure.  Patient ID and intended procedure confirmed with present staff. Received instructions for my participation in the procedure from the performing physician.  

## 2022-01-31 NOTE — Patient Instructions (Addendum)
Handouts Provided:  Polyps  RESUME XARELTO Tomorrow  YOU HAD AN ENDOSCOPIC PROCEDURE TODAY AT [facility]:   Refer to the procedure report that was given to you for any specific questions about what was found during the examination.  If the procedure report does not answer your questions, please call your gastroenterologist to clarify.  If you requested that your care partner not be given the details of your procedure findings, then the procedure report has been included in a sealed envelope for you to review at your convenience later.  YOU SHOULD EXPECT: Some feelings of bloating in the abdomen. Passage of more gas than usual.  Walking can help get rid of the air that was put into your GI tract during the procedure and reduce the bloating. If you had a lower endoscopy (such as a colonoscopy or flexible sigmoidoscopy) you may notice spotting of blood in your stool or on the toilet paper. If you underwent a bowel prep for your procedure, you may not have a normal bowel movement for a few days.  Please Note:  You might notice some irritation and congestion in your nose or some drainage.  This is from the oxygen used during your procedure.  There is no need for concern and it should clear up in a day or so.  SYMPTOMS TO REPORT IMMEDIATELY:  Following lower endoscopy (colonoscopy or flexible sigmoidoscopy):  Excessive amounts of blood in the stool  Significant tenderness or worsening of abdominal pains  Swelling of the abdomen that is new, acute  Fever of 100F or higher  For urgent or emergent issues, a gastroenterologist can be reached at any hour by calling [phone]. Do not use MyChart messaging for urgent concerns.    DIET:  We do recommend a small meal at first, but then you may proceed to your regular diet.  Drink plenty of fluids but you should avoid alcoholic beverages for 24 hours.  ACTIVITY:  You should plan to take it easy for the rest of today and you should NOT  DRIVE or use heavy machinery until tomorrow (because of the sedation medicines used during the test).    FOLLOW UP: Our staff will call the number listed on your records the next business day following your procedure.  We will call around 7:15- 8:00 am to check on you and address any questions or concerns that you may have regarding the information given to you following your procedure. If we do not reach you, we will leave a message.  If you develop any symptoms (ie: fever, flu-like symptoms, shortness of breath, cough etc.) before then, please call [phone].  If you test positive for Covid 19 in the 2 weeks post procedure, please call and report this information to [place].    If any biopsies were taken you will be contacted by phone or by letter within the next 1-3 weeks.  Please call us at [phone] if you have not heard about the biopsies in 3 weeks.    SIGNATURES/CONFIDENTIALITY: You and/or your care partner have signed paperwork which will be entered into your electronic medical record.  These signatures attest to the fact that that the information above on your After Visit Summary has been reviewed and is understood.  Full responsibility of the confidentiality of this discharge information lies with you and/or your care-partner.

## 2022-01-31 NOTE — Progress Notes (Signed)
Report to PACU, RN, vss, BBS= Clear.  

## 2022-01-31 NOTE — Progress Notes (Signed)
  The recent H&P (dated 01/01/2022) was reviewed, the patient was examined and there is no change in the patients condition since that H&P was completed.   [name]  01/31/2022, 8:43 AM

## 2022-02-03 ENCOUNTER — Telehealth: Payer: Self-pay | Admitting: *Deleted

## 2022-02-03 NOTE — Telephone Encounter (Signed)
No answer on first attempt follow up call. Left message.  ?

## 2022-02-10 ENCOUNTER — Other Ambulatory Visit: Payer: Self-pay | Admitting: Student

## 2022-02-10 ENCOUNTER — Encounter: Payer: Self-pay | Admitting: Gastroenterology

## 2022-02-10 DIAGNOSIS — E1142 Type 2 diabetes mellitus with diabetic polyneuropathy: Secondary | ICD-10-CM

## 2022-02-10 DIAGNOSIS — E1140 Type 2 diabetes mellitus with diabetic neuropathy, unspecified: Secondary | ICD-10-CM

## 2022-02-19 ENCOUNTER — Other Ambulatory Visit: Payer: Self-pay

## 2022-02-19 DIAGNOSIS — I10 Essential (primary) hypertension: Secondary | ICD-10-CM

## 2022-02-19 MED ORDER — AMLODIPINE BESYLATE 10 MG PO TABS: 10.0000 mg | ORAL_TABLET | Freq: Every day | ORAL | 3 refills | Status: DC

## 2022-02-20 NOTE — Telephone Encounter (Signed)
Colon done by Ardis Hughs on 6/30th

## 2022-03-07 ENCOUNTER — Other Ambulatory Visit: Payer: Self-pay | Admitting: *Deleted

## 2022-03-07 NOTE — Patient Outreach (Signed)
  Care Coordination   03/07/2022 Name: ALANNIS HSIA MRN: [id number] DOB: 09-07-1955   Care Coordination Outreach Attempts:  An unsuccessful telephone outreach was attempted today to offer the patient information about available care coordination services as a benefit of their health plan.   Follow Up Plan:  No further outreach attempts will be made at this time. We have been unable to contact the patient to offer or enroll patient in care coordination services  Encounter Outcome:  Pt. Refused  Care Coordination Interventions Activated:  No   Care Coordination Interventions:  No, not indicated    Emelia Loron RN, BSN [place] [phone] [email or web address]

## 2022-03-17 ENCOUNTER — Ambulatory Visit: Payer: Medicare Other | Admitting: Student

## 2022-03-31 ENCOUNTER — Ambulatory Visit: Payer: Medicare Other | Admitting: Family Medicine

## 2022-04-01 ENCOUNTER — Ambulatory Visit: Payer: Medicare Other | Admitting: Student

## 2022-04-11 ENCOUNTER — Other Ambulatory Visit: Payer: Self-pay | Admitting: *Deleted

## 2022-04-11 DIAGNOSIS — E109 Type 1 diabetes mellitus without complications: Secondary | ICD-10-CM

## 2022-04-14 MED ORDER — ONETOUCH VERIO VI STRP: ORAL_STRIP | 11 refills | Status: DC

## 2022-04-15 ENCOUNTER — Other Ambulatory Visit: Payer: Self-pay | Admitting: Student

## 2022-04-15 DIAGNOSIS — E1169 Type 2 diabetes mellitus with other specified complication: Secondary | ICD-10-CM

## 2022-04-29 ENCOUNTER — Other Ambulatory Visit: Payer: Self-pay

## 2022-04-29 ENCOUNTER — Emergency Department (HOSPITAL_COMMUNITY)
Admission: EM | Admit: 2022-04-29 | Discharge: 2022-04-29 | Discharge status: Against Medical Advice | Payer: Medicare Other | Attending: Student | Admitting: Student

## 2022-04-29 ENCOUNTER — Encounter (HOSPITAL_COMMUNITY): Payer: Self-pay | Admitting: Emergency Medicine

## 2022-04-29 DIAGNOSIS — Z5321 Procedure and treatment not carried out due to patient leaving prior to being seen by health care provider: Secondary | ICD-10-CM | POA: Diagnosis not present

## 2022-04-29 DIAGNOSIS — R519 Headache, unspecified: Secondary | ICD-10-CM | POA: Diagnosis present

## 2022-04-29 DIAGNOSIS — I10 Essential (primary) hypertension: Secondary | ICD-10-CM | POA: Insufficient documentation

## 2022-04-29 LAB — BASIC METABOLIC PANEL
Anion gap: 10 (ref 5–15)
BUN: 12 mg/dL (ref 8–23)
CO2: 22 mmol/L (ref 22–32)
Calcium: 9.3 mg/dL (ref 8.9–10.3)
Chloride: 104 mmol/L (ref 98–111)
Creatinine, Ser: 0.97 mg/dL (ref 0.44–1.00)
GFR, Estimated: >60 mL/min (ref >60)
Glucose, Bld: 240 mg/dL — ABNORMAL HIGH (ref 70–99)
Potassium: 3.8 mmol/L (ref 3.5–5.1)
Sodium: 136 mmol/L (ref 135–145)

## 2022-04-29 NOTE — ED Triage Notes (Signed)
Patient arrives ambulatory with cane to triage by POV c/o hypertension. Patient went to dentist to have procedure and was sent here due to elevated blood pressure readings. (217/122) patient states she took her BP meds. C/o headache since waking this am. Denies any other weakness or numbness. A&0x4.

## 2022-04-29 NOTE — ED Provider Triage Note (Signed)
Emergency Medicine Provider Triage Evaluation Note  [name] , a 66 y.o. female  was evaluated in triage.  Pt complains of high blood pressure.  Patient went to dentist office today where her blood pressure was elevated she was sent to the ER.  Patient complains of a headache.  She reports her blood pressure is typically elevated and this is at her baseline.  Patient denies any chest pain or shortness of breath.  Patient did take her blood pressure medication this morning.  Blood pressure in triage is 167/111.  Review of Systems  Positive: Headache Negative: Chest pain, shortness of breath  Physical Exam  BP (!) 167/111 (BP Location: Left Arm)   Pulse 87   Temp 98.2 F (36.8 C) (Oral)   Resp 18   Ht '5\' 1"'$  (1.549 m)   Wt 104.3 kg   SpO2 100%   BMI 43.46 kg/m  Gen:   Awake, no distress   Resp:  Normal effort  MSK:   Moves extremities without difficulty  Other:  Heart regular rate and rhythm.  Lungs clear to auscultation bilaterally.  Medical Decision Making  Medically screening exam initiated at 12:57 PM.  Appropriate orders placed.  [name] was informed that the remainder of the evaluation will be completed by another provider, this initial triage assessment does not replace that evaluation, and the importance of remaining in the ED until their evaluation is complete.  Patient presents for headache with hypertension at dentist office today.  Patient did take her blood pressure medication.  Patient denies chest pain or shortness of breath.  I reviewed patient's past blood pressures which shows hypertension.  Close to baseline.  Will obtain basic labs to assess for any endorgan damage.  No chest pain.  No indication for troponin at this time.   Doristine Devoid, PA-C 04/29/22 1300

## 2022-04-29 NOTE — ED Notes (Signed)
Pt no longer wanted to wait. Pt left Hospital

## 2022-05-02 ENCOUNTER — Other Ambulatory Visit: Payer: Self-pay | Admitting: Student

## 2022-05-02 DIAGNOSIS — E1140 Type 2 diabetes mellitus with diabetic neuropathy, unspecified: Secondary | ICD-10-CM

## 2022-05-02 DIAGNOSIS — E1142 Type 2 diabetes mellitus with diabetic polyneuropathy: Secondary | ICD-10-CM

## 2022-06-27 ENCOUNTER — Other Ambulatory Visit: Payer: Self-pay | Admitting: Student

## 2022-06-27 DIAGNOSIS — E1140 Type 2 diabetes mellitus with diabetic neuropathy, unspecified: Secondary | ICD-10-CM

## 2022-07-01 ENCOUNTER — Other Ambulatory Visit: Payer: Self-pay | Admitting: Student

## 2022-07-01 DIAGNOSIS — E1142 Type 2 diabetes mellitus with diabetic polyneuropathy: Secondary | ICD-10-CM

## 2022-07-01 DIAGNOSIS — E1140 Type 2 diabetes mellitus with diabetic neuropathy, unspecified: Secondary | ICD-10-CM

## 2022-07-11 ENCOUNTER — Other Ambulatory Visit: Payer: Self-pay | Admitting: Student

## 2022-07-11 DIAGNOSIS — E1140 Type 2 diabetes mellitus with diabetic neuropathy, unspecified: Secondary | ICD-10-CM

## 2022-07-11 DIAGNOSIS — E1142 Type 2 diabetes mellitus with diabetic polyneuropathy: Secondary | ICD-10-CM

## 2022-07-22 ENCOUNTER — Ambulatory Visit (INDEPENDENT_AMBULATORY_CARE_PROVIDER_SITE_OTHER): Payer: Medicare Other | Admitting: Student

## 2022-07-22 ENCOUNTER — Encounter: Payer: Self-pay | Admitting: Student

## 2022-07-22 VITALS — BP 137/80 | HR 80 | Temp 98.3°F | Ht 61.0 in | Wt 237.2 lb

## 2022-07-22 DIAGNOSIS — E119 Type 2 diabetes mellitus without complications: Secondary | ICD-10-CM

## 2022-07-22 DIAGNOSIS — I10 Essential (primary) hypertension: Secondary | ICD-10-CM | POA: Diagnosis not present

## 2022-07-22 DIAGNOSIS — M545 Low back pain, unspecified: Secondary | ICD-10-CM | POA: Diagnosis not present

## 2022-07-22 DIAGNOSIS — E785 Hyperlipidemia, unspecified: Secondary | ICD-10-CM

## 2022-07-22 DIAGNOSIS — G8929 Other chronic pain: Secondary | ICD-10-CM

## 2022-07-22 DIAGNOSIS — F32A Depression, unspecified: Secondary | ICD-10-CM

## 2022-07-22 DIAGNOSIS — I4891 Unspecified atrial fibrillation: Secondary | ICD-10-CM | POA: Diagnosis not present

## 2022-07-22 DIAGNOSIS — Z794 Long term (current) use of insulin: Secondary | ICD-10-CM

## 2022-07-22 DIAGNOSIS — G4733 Obstructive sleep apnea (adult) (pediatric): Secondary | ICD-10-CM

## 2022-07-22 DIAGNOSIS — E1169 Type 2 diabetes mellitus with other specified complication: Secondary | ICD-10-CM | POA: Diagnosis not present

## 2022-07-22 LAB — POCT GLYCOSYLATED HEMOGLOBIN (HGB A1C): HbA1c, POC (controlled diabetic range): 10.2 % — AB (ref 0.0–7.0)

## 2022-07-22 NOTE — Patient Instructions (Signed)
It was great to see you! Thank you for allowing me to participate in your care!  I recommend that you always bring your medications to each appointment as this makes it easy to ensure we are on the correct medications and helps [place] not miss when refills are needed.  Our plans for today:  - Diabetes  Checking A1c  Will call you with result - High Blood Pressure  Well controlled today!  Continue taking medications -Sleep Apnea  I'm going to try to reorder you a CPAP -High Cholesterol  Checking lab today -Back Pain  Tylenol 1000 mg every 8 hours, or 3 times a day.   Salonpas (lidocaine patches), available over the counter in most pharmacies  Voltaren gel, apply to back 3-4 times a day. Available over the counter in most pharmacies     -Depression  Continue to speak with [place] and first lady  Consider therapy in the future, if you would like  Follow up Come back to talk about depression/sleep and headache  We are checking some labs today, I will call you if they are abnormal will send you a MyChart message or a letter if they are normal.  If you do not hear about your labs in the next 2 weeks please let us know.  Take care and seek immediate care sooner if you develop any concerns.   Dr. Holley Bouche, MD [place]

## 2022-07-22 NOTE — Progress Notes (Unsigned)
SUBJECTIVE:   CHIEF COMPLAINT / HPI:   [place] Patient lives with 2nd oldest daughter, grand kids live with different daughter. Patient feels distress because she is unable to be with grand kids/host grand kids as she doesn't have the space/resources. Patient income from program with [facility], to pay for bills/food.   Health Issues  DM: Last A1c 11, patient had lost meds due to housing insecurity Supposed to be on Meds: Jardiance 25, Lispro 25 units with meals, Lantus 25, Ozempic 0.5 mg weekly, gabapentin 900 mg TID Actually on: Hasn't been taking jardiance for 2 months. Taking lantus 33 units. Unsure of dose of lispro, out of ozempic for months.  Urine today: Foot Exam: A1c today:   HTN: BP Readings from Last 3 Encounters:  07/22/22 137/80  04/29/22 (!) 179/99  01/31/22 (!) 167/88  BP poorly controlled, previously on chlorthalidone but noted it made her dizzy.  Meds: lisinopril 40 mg daily, amlodipine 10 mg daily, HCTZ 12.5 mg BMP today  Depression: Patient with notable hx of depression due to social stressors. Patient resistant to starting medication  A. Fib: No f/u w/ cards since 2017. Patient on Xaralto 20 mg  OSA: Last sleep study 2015. Using CPAP?  HLD: Lab Results  Component Value Date   CHOL 173 07/22/2022   HDL 47 07/22/2022   LDLCALC 109 (H) 07/22/2022   LDLDIRECT 51 03/27/2016   TRIG 92 07/22/2022   CHOLHDL 3.7 07/22/2022  Lipitor 80 mg Lipid Panel today   CKD: CKD likely 2/2 diabetes vs HTN. On lisinopril 40 mg and Jardiance 25 mg  Chronic Low Back Pain: Degenerative disk disease for 20 years  gabapentin and tylenol Arthritic strength x2 at night for sleep  Voltaren and lidocaine for pain Tramadol for breakthrough pain Report: Warm/cold compress.      PERTINENT  PMH / PSH:     OBJECTIVE:  BP 137/80   Pulse 80   Temp 98.3 F (36.8 C)   Ht '5\' 1"'$  (1.549 m)   Wt 237 lb 3.2 oz (107.6 kg)   SpO2 98%    BMI 44.82 kg/m  Physical Exam Constitutional:      Appearance: Normal appearance.  Cardiovascular:     Rate and Rhythm: Normal rate and regular rhythm.     Pulses: Normal pulses.     Heart sounds: Normal heart sounds. No murmur heard.    No friction rub. No gallop.  Pulmonary:     Effort: Pulmonary effort is normal. No respiratory distress.     Breath sounds: Normal breath sounds. No stridor. No wheezing, rhonchi or rales.  Abdominal:     General: There is no distension.     Palpations: Abdomen is soft. There is no mass.     Tenderness: There is no abdominal tenderness. There is no guarding.  Musculoskeletal:     Right foot: No deformity or bunion.     Left foot: No deformity or bunion.  Feet:     Right foot:     Skin integrity: Dry skin present. No ulcer, blister, skin breakdown or erythema.     Left foot:     Skin integrity: Dry skin present. No ulcer, blister, skin breakdown or erythema.  Neurological:     Mental Status: She is alert.  Psychiatric:        Attention and Perception: Attention normal.        Mood and Affect: Mood is depressed. Affect is labile.  Speech: Speech normal.        Behavior: Behavior normal. Behavior is cooperative.        Thought Content: Thought content normal.      ASSESSMENT/PLAN:  Diabetes mellitus type 2, insulin dependent ([place]) Assessment & Plan: Patient reports she's not been watching her diet and hasn't been taking all of her DM meds. Reports not taking Jardiance and ozempic for months, and reports she was taking 33 units lantus. Will have patient f/u with [name] for management of DM. Will want to consider d/c lispro as patient unsure of dosing and with social hx concerns for housing/stability.  -F/u w/ [name] -Regimen: -Restart Jardiance 25 mg  -Increase Lantus to 38 units -Restart Ozempic 0.5 mg wkly -Consider d/c Lispro 25 units w/ meals  Orders: -     POCT glycosylated hemoglobin (Hb A1C) -     Basic metabolic  panel  Hyperlipidemia associated with type 2 diabetes mellitus ([place]) Assessment & Plan: Patient due for lipid check, last panel 2022. Patient noted she is taking her meds. -Continue lipitor 80 mg -Lipid panel  Orders: -     Lipid panel  Essential hypertension Assessment & Plan: BP controlled today at 137/80, patient taking all her meds. Will continue to monitor and obtain BMP today -BMP -Continue lisinopril 40 mg, amlodipine 10 mg, HCTZ 12.'5mg'$    Orders: -     BASIC METABOLIC PANEL WITH GFR -     Basic metabolic panel  Atrial fibrillation, unspecified type [facility]) Assessment & Plan: HR controlled, last saw Cards 2017. Patient taking AC -Continue Xaralto 20 mg   Mild obstructive sleep apnea Assessment & Plan: Patient report's she lost her CPAP machine in move and is wanting another. Last sleep study 2015. -DME for CPAP  Orders: -     For home use only DME continuous positive airway pressure (CPAP)  Chronic midline low back pain, unspecified whether sciatica present Assessment & Plan: Patient notes that she continues to have back pain. She takes gabapentin and tylenol for pain, but is burdened with pain/limited in activity 2/2 pain. Patient attempts to accept that back pain will be a part of her life. Patient sing tramadol for breakthrough pain. Will recommend OTC pain modalities.  -Warm/cold compress -Voltaren gel  -Lidocaine patch   Depression, unspecified depression type Assessment & Plan: Patient continues to struggle with depression 2/2 to social stressors and situation. Patient wanting to be closer/host grand kids and live with grand kids, but unable to 2/2 finances. Patient denies any SI/HI but is tearful on exploration of depression/social stressors. Patient not wanting to start medication or therapy, and feels that she get's adequate support from her church bishop/first lady, and older women in the church.  -Will continue to monitor    No follow-ups on  file. Holley Bouche, MD 07/24/2022, 9:56 AM PGY-2, [place]

## 2022-07-23 LAB — BASIC METABOLIC PANEL
BUN/Creatinine Ratio: 13 (ref 12–28)
BUN: 12 mg/dL (ref 8–27)
CO2: 24 mmol/L (ref 20–29)
Calcium: 9.7 mg/dL (ref 8.7–10.3)
Chloride: 101 mmol/L (ref 96–106)
Creatinine, Ser: 0.95 mg/dL (ref 0.57–1.00)
Glucose: 267 mg/dL — ABNORMAL HIGH (ref 70–99)
Potassium: 4.3 mmol/L (ref 3.5–5.2)
Sodium: 138 mmol/L (ref 134–144)
eGFR: 66 mL/min/{1.73_m2} (ref >59)

## 2022-07-23 LAB — LIPID PANEL
Chol/HDL Ratio: 3.7 ratio (ref 0.0–4.4)
Cholesterol, Total: 173 mg/dL (ref 100–199)
HDL: 47 mg/dL (ref >39)
LDL Chol Calc (NIH): 109 mg/dL — ABNORMAL HIGH (ref 0–99)
Triglycerides: 92 mg/dL (ref 0–149)
VLDL Cholesterol Cal: 17 mg/dL (ref 5–40)

## 2022-07-24 ENCOUNTER — Telehealth: Payer: Self-pay | Admitting: Student

## 2022-07-24 ENCOUNTER — Telehealth: Payer: Self-pay

## 2022-07-24 DIAGNOSIS — E1140 Type 2 diabetes mellitus with diabetic neuropathy, unspecified: Secondary | ICD-10-CM

## 2022-07-24 MED ORDER — OZEMPIC (0.25 OR 0.5 MG/DOSE) 2 MG/1.5ML ~~LOC~~ SOPN: PEN_INJECTOR | SUBCUTANEOUS | 0 refills | Status: DC

## 2022-07-24 MED ORDER — EMPAGLIFLOZIN 25 MG PO TABS: 25.0000 mg | ORAL_TABLET | Freq: Every day | ORAL | 3 refills | Status: DC

## 2022-07-24 NOTE — Assessment & Plan Note (Addendum)
Patient reports she's not been watching her diet and hasn't been taking all of her DM meds. Reports not taking Jardiance and ozempic for months, and reports she was taking 33 units lantus. Will have patient f/u with [name] for management of DM. Will want to consider d/c lispro as patient unsure of dosing and with social hx concerns for housing/stability.  -F/u w/ [name] -Regimen: -Restart Jardiance 25 mg  -Increase Lantus to 38 units -Restart Ozempic 0.5 mg wkly -Consider d/c Lispro 25 units w/ meals

## 2022-07-24 NOTE — Assessment & Plan Note (Signed)
Patient notes that she continues to have back pain. She takes gabapentin and tylenol for pain, but is burdened with pain/limited in activity 2/2 pain. Patient attempts to accept that back pain will be a part of her life. Patient sing tramadol for breakthrough pain. Will recommend OTC pain modalities.  -Warm/cold compress -Voltaren gel  -Lidocaine patch

## 2022-07-24 NOTE — Assessment & Plan Note (Signed)
HR controlled, last saw Cards 2017. Patient taking AC -Continue Xaralto 20 mg

## 2022-07-24 NOTE — Assessment & Plan Note (Signed)
Patient due for lipid check, last panel 2022. Patient noted she is taking her meds. -Continue lipitor 80 mg -Lipid panel

## 2022-07-24 NOTE — Assessment & Plan Note (Signed)
Patient continues to struggle with depression 2/2 to social stressors and situation. Patient wanting to be closer/host grand kids and live with grand kids, but unable to 2/2 finances. Patient denies any SI/HI but is tearful on exploration of depression/social stressors. Patient not wanting to start medication or therapy, and feels that she get's adequate support from her church bishop/first lady, and older women in the church.  -Will continue to monitor

## 2022-07-24 NOTE — Telephone Encounter (Signed)
Received message from provider that order for CPAP has been placed.   Community message sent to [employer].   Will await response.   Talbot Grumbling, RN

## 2022-07-24 NOTE — Assessment & Plan Note (Addendum)
Patient report's she lost her CPAP machine in move and is wanting another. Last sleep study 2015. -DME for CPAP

## 2022-07-24 NOTE — Telephone Encounter (Signed)
Called to update patient on labs and encourage her to meet with [name]. Also increased patient dose of lantus to 38, and d/c lispro.

## 2022-07-24 NOTE — Assessment & Plan Note (Addendum)
BP controlled today at 137/80, patient taking all her meds. Will continue to monitor and obtain BMP today -BMP -Continue lisinopril 40 mg, amlodipine 10 mg, HCTZ 12.'5mg'$ 

## 2022-07-25 NOTE — Telephone Encounter (Signed)
Receipt confirmed by [employer].   Melissa Tomaselli C Allice Garro, RN  

## 2022-07-25 NOTE — Telephone Encounter (Signed)
[name]!  Does this mean they need a note showing that we've discussed her getting benefit from her CPAP? It's been lost for some time, but I'm not sure what they want me to show in a face 2 face note. I kinda feel like I should have said as much in my last note, and will feel bad bringing her back for a visit to say that. But she did mention to me that she get's benefit from it, she just hasn't had it in some time.  Thank you!  -BS

## 2022-07-25 NOTE — Telephone Encounter (Signed)
Please see the following message from [employer].   Hello Jarrett Soho,   Order has been received. However, I am in need of face 2 face notes showing usage and benefit to process this order. Patient may need new sleep study if face 2 face note is unable to be obtained. I noticed patient lost her cpap and looks to have not been seen in a while.   Please advise.   Thank you,   Leroy [place]   If possible, please update last OV note to reflect the usage and benefit from CPAP machine for patient.   Talbot Grumbling, RN

## 2022-07-29 NOTE — Patient Instructions (Signed)
[name] , Thank you for taking time to come for your Medicare Wellness Visit. I appreciate your ongoing commitment to your health goals. Please review the following plan we discussed and let me know if I can assist you in the future.   These are the goals we discussed:  Goals      Begin and Stick with Counseling-Depression     Timeframe:  Short-Term Goal Priority:  High Start Date9/01/2021                             Expected End Date:                       Follow Up Date 04/23/21    Patient Goals/Self-Care Activities: Over the next 14 days Call your insurance provider to discuss transportation options I have placed a referral with [facility] to assist with connecting you with a mental health provider. they will contact you once a provider is located. Review your EMMI educational information on Insomnia and getting a good night sleep and Breathing to relax,      Why is this important?   Beating depression may take some time.  If you don't feel better right away, don't give up on your treatment plan.       Exercise 30 mins a time 3x a day     Walk 15 minutes in AM and 15 minutes PM        This is a list of the screening recommended for you and due dates:  Health Maintenance  Topic Date Due   Yearly kidney health urinalysis for diabetes  Never done   Medicare Annual Wellness Visit  06/01/2020   Zoster (Shingles) Vaccine (2 of 2) 03/11/2021   Eye exam for diabetics  04/02/2021   Complete foot exam   01/10/2022   Flu Shot  03/04/2022   COVID-19 Vaccine (5 - 2023-24 season) 04/04/2022   Pneumonia Vaccine (2 - PCV) 10/03/2022*   Hemoglobin A1C  01/21/2023   Yearly kidney function blood test for diabetes  07/23/2023   Mammogram  08/17/2023   Colon Cancer Screening  01/31/2029   DTaP/Tdap/Td vaccine (3 - Td or Tdap) 01/15/2031   DEXA scan (bone density measurement)  Completed   Hepatitis C Screening: USPSTF Recommendation to screen - Ages 21-79 yo.  Completed   HPV Vaccine   Aged Out  *Topic was postponed. The date shown is not the original due date.    Advanced directives: ***  Conditions/risks identified: ***  Next appointment: Follow up in one year for your annual wellness visit ***   Preventive Care 65 Years and Older, Female Preventive care refers to lifestyle choices and visits with your health care provider that can promote health and wellness. What does preventive care include? A yearly physical exam. This is also called an annual well check. Dental exams once or twice a year. Routine eye exams. Ask your health care provider how often you should have your eyes checked. Personal lifestyle choices, including: Daily care of your teeth and gums. Regular physical activity. Eating a healthy diet. Avoiding tobacco and drug use. Limiting alcohol use. Practicing safe sex. Taking low-dose aspirin every day. Taking vitamin and mineral supplements as recommended by your health care provider. What happens during an annual well check? The services and screenings done by your health care provider during your annual well check will depend on your age, overall health, lifestyle  risk factors, and family history of disease. Counseling  Your health care provider may ask you questions about your: Alcohol use. Tobacco use. Drug use. Emotional well-being. Home and relationship well-being. Sexual activity. Eating habits. History of falls. Memory and ability to understand (cognition). Work and work Statistician. Reproductive health. Screening  You may have the following tests or measurements: Height, weight, and BMI. Blood pressure. Lipid and cholesterol levels. These may be checked every 5 years, or more frequently if you are over 5 years old. Skin check. Lung cancer screening. You may have this screening every year starting at age 3 if you have a 30-pack-year history of smoking and currently smoke or have quit within the past 15 years. Fecal occult  blood test (FOBT) of the stool. You may have this test every year starting at age 27. Flexible sigmoidoscopy or colonoscopy. You may have a sigmoidoscopy every 5 years or a colonoscopy every 10 years starting at age 96. Hepatitis C blood test. Hepatitis B blood test. Sexually transmitted disease (STD) testing. Diabetes screening. This is done by checking your blood sugar (glucose) after you have not eaten for a while (fasting). You may have this done every 1-3 years. Bone density scan. This is done to screen for osteoporosis. You may have this done starting at age 40. Mammogram. This may be done every 1-2 years. Talk to your health care provider about how often you should have regular mammograms. Talk with your health care provider about your test results, treatment options, and if necessary, the need for more tests. Vaccines  Your health care provider may recommend certain vaccines, such as: Influenza vaccine. This is recommended every year. Tetanus, diphtheria, and acellular pertussis (Tdap, Td) vaccine. You may need a Td booster every 10 years. Zoster vaccine. You may need this after age 72. Pneumococcal 13-valent conjugate (PCV13) vaccine. One dose is recommended after age 10. Pneumococcal polysaccharide (PPSV23) vaccine. One dose is recommended after age 39. Talk to your health care provider about which screenings and vaccines you need and how often you need them. This information is not intended to replace advice given to you by your health care provider. Make sure you discuss any questions you have with your health care provider. Document Released: 08/17/2015 Document Revised: 04/09/2016 Document Reviewed: 05/22/2015 Elsevier Interactive Patient Education  2017 [place] Prevention in the Home Falls can cause injuries. They can happen to people of all ages. There are many things you can do to make your home safe and to help prevent falls. What can I do on the outside of my  home? Regularly fix the edges of walkways and driveways and fix any cracks. Remove anything that might make you trip as you walk through a door, such as a raised step or threshold. Trim any bushes or trees on the path to your home. Use bright outdoor lighting. Clear any walking paths of anything that might make someone trip, such as rocks or tools. Regularly check to see if handrails are loose or broken. Make sure that both sides of any steps have handrails. Any raised decks and porches should have guardrails on the edges. Have any leaves, snow, or ice cleared regularly. Use sand or salt on walking paths during winter. Clean up any spills in your garage right away. This includes oil or grease spills. What can I do in the bathroom? Use night lights. Install grab bars by the toilet and in the tub and shower. Do not use towel bars as grab  bars. Use non-skid mats or decals in the tub or shower. If you need to sit down in the shower, use a plastic, non-slip stool. Keep the floor dry. Clean up any water that spills on the floor as soon as it happens. Remove soap buildup in the tub or shower regularly. Attach bath mats securely with double-sided non-slip rug tape. Do not have throw rugs and other things on the floor that can make you trip. What can I do in the bedroom? Use night lights. Make sure that you have a light by your bed that is easy to reach. Do not use any sheets or blankets that are too big for your bed. They should not hang down onto the floor. Have a firm chair that has side arms. You can use this for support while you get dressed. Do not have throw rugs and other things on the floor that can make you trip. What can I do in the kitchen? Clean up any spills right away. Avoid walking on wet floors. Keep items that you use a lot in easy-to-reach places. If you need to reach something above you, use a strong step stool that has a grab bar. Keep electrical cords out of the way. Do  not use floor polish or wax that makes floors slippery. If you must use wax, use non-skid floor wax. Do not have throw rugs and other things on the floor that can make you trip. What can I do with my stairs? Do not leave any items on the stairs. Make sure that there are handrails on both sides of the stairs and use them. Fix handrails that are broken or loose. Make sure that handrails are as long as the stairways. Check any carpeting to make sure that it is firmly attached to the stairs. Fix any carpet that is loose or worn. Avoid having throw rugs at the top or bottom of the stairs. If you do have throw rugs, attach them to the floor with carpet tape. Make sure that you have a light switch at the top of the stairs and the bottom of the stairs. If you do not have them, ask someone to add them for you. What else can I do to help prevent falls? Wear shoes that: Do not have high heels. Have rubber bottoms. Are comfortable and fit you well. Are closed at the toe. Do not wear sandals. If you use a stepladder: Make sure that it is fully opened. Do not climb a closed stepladder. Make sure that both sides of the stepladder are locked into place. Ask someone to hold it for you, if possible. Clearly mark and make sure that you can see: Any grab bars or handrails. First and last steps. Where the edge of each step is. Use tools that help you move around (mobility aids) if they are needed. These include: Canes. Walkers. Scooters. Crutches. Turn on the lights when you go into a dark area. Replace any light bulbs as soon as they burn out. Set up your furniture so you have a clear path. Avoid moving your furniture around. If any of your floors are uneven, fix them. If there are any pets around you, be aware of where they are. Review your medicines with your doctor. Some medicines can make you feel dizzy. This can increase your chance of falling. Ask your doctor what other things that you can do to  help prevent falls. This information is not intended to replace advice given to you by your  health care provider. Make sure you discuss any questions you have with your health care provider. Document Released: 05/17/2009 Document Revised: 12/27/2015 Document Reviewed: 08/25/2014 Elsevier Interactive Patient Education  2017 [employer].

## 2022-07-30 ENCOUNTER — Ambulatory Visit (INDEPENDENT_AMBULATORY_CARE_PROVIDER_SITE_OTHER): Payer: Medicare Other | Admitting: Family Medicine

## 2022-07-30 VITALS — Ht 61.0 in | Wt 237.0 lb

## 2022-07-30 DIAGNOSIS — Z0000 Encounter for general adult medical examination without abnormal findings: Secondary | ICD-10-CM

## 2022-07-30 NOTE — Telephone Encounter (Signed)
Message sent to [employer] for clarification. Will update once I receive response.   Talbot Grumbling, RN

## 2022-07-30 NOTE — Progress Notes (Signed)
I connected with  [name] on 07/30/22 by a audio enabled telemedicine application and verified that I am speaking with the correct person using two identifiers.  Patient Location: Home  Provider Location: Home Office  I discussed the limitations of evaluation and management by telemedicine. The patient expressed understanding and agreed to proceed. Subjective:   [name] is a 66 y.o. female who presents for Medicare Annual (Subsequent) preventive examination.  Review of Systems          Objective:    Today's Vitals   07/30/22 1108  Weight: 237 lb (107.5 kg)  Height: _0  (1.549 m)   Body mass index is 44.78 kg/m.     07/30/2022   11:14 AM 07/22/2022    1:47 PM 04/29/2022   12:53 PM 10/02/2021   11:28 AM 06/24/2021    1:40 PM 03/07/2021    1:33 PM 02/28/2021    1:44 PM  Advanced Directives  Does Patient Have a Medical Advance Directive? No No Yes No No No No  Type of Advance Directive   Living will      Would patient like information on creating a medical advance directive? No - Patient declined   No - Patient declined No - Patient declined No - Patient declined No - Patient declined    Current Medications (verified) Outpatient Encounter Medications as of 07/30/2022  Medication Sig   acetaminophen (TYLENOL) 500 MG tablet Take 1,000 mg by mouth every 6 (six) hours as needed for moderate pain.   amLODipine (NORVASC) 10 MG tablet Take 1 tablet (10 mg total) by mouth daily.   atorvastatin (LIPITOR) 80 MG tablet Take 1 tablet (80 mg total) by mouth daily. (Cholesterol)   Blood Glucose Monitoring Suppl (ONETOUCH VERIO FLEX SYSTEM) w/Device KIT Check blood sugar 3 times per day.   clobetasol ointment (TEMOVATE) 7.61 % Apply 1 application topically 2 (two) times daily.   Continuous Blood Gluc Receiver (DEXCOM G6 RECEIVER) DEVI 1 kit by Does not apply route as directed.   Continuous Blood Gluc Sensor (DEXCOM G6 SENSOR) MISC Inject 3 Devices into the skin as directed.    Continuous Blood Gluc Transmit (DEXCOM G6 TRANSMITTER) MISC Inject 1 Device into the skin as directed.   diclofenac Sodium (VOLTAREN) 1 % GEL Apply 2 g topically 4 (four) times daily.   empagliflozin (JARDIANCE) 25 MG TABS tablet Take 1 tablet (25 mg total) by mouth daily.   gabapentin (NEURONTIN) 300 MG capsule TAKE THREE CAPSULES BY MOUTH THREE TIMES DAILY   glucose blood (ONETOUCH VERIO) test strip use to test three times daily   GNP ULTICARE PEN NEEDLES 31G X 8 MM MISC 1 applicator by Does not apply route 4 (four) times daily.   hydrochlorothiazide (HYDRODIURIL) 25 MG tablet Take 0.5 tablets (12.5 mg total) by mouth daily.   Icosapent Ethyl 0.5 g CAPS Take 1 capsule by mouth 2 (two) times daily.   insulin glargine (LANTUS SOLOSTAR) 100 UNIT/ML Solostar Pen Inject 25 Units into the skin daily.   insulin lispro (HUMALOG) 100 UNIT/ML KwikPen Inject 25 Units into the skin daily.   lidocaine (LIDODERM) 5 % Place 1 patch onto the skin daily. Remove & Discard patch within 12 hours or as directed by MD   lisinopril (ZESTRIL) 40 MG tablet Take 1 tablet (40 mg total) by mouth daily.   OneTouch Delica Lancets 95K MISC Check blood sugar 3 times daily   rivaroxaban (XARELTO) 20 MG TABS tablet Take 1 tablet (20 mg total)  by mouth daily with supper.   Semaglutide,0.25 or 0.5MG/DOS, (OZEMPIC, 0.25 OR 0.5 MG/DOSE,) 2 MG/1.5ML SOPN Inject 0.5 mg weekly.   traMADol (ULTRAM) 50 MG tablet Take 1 tablet (50 mg total) by mouth daily as needed.   No facility-administered encounter medications on file as of 07/30/2022.    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 Claudean Kinds.    Hypercholesteremia    Hypertension    Hypertension    Hypertensive urgency 05/2006   Stroke (cerebrum) ([place])    TIA (transient ischemic attack) 2017   Past Surgical History:  Procedure Laterality Date   ARTERY BIOPSY Right 09/18/2017   Procedure: BIOPSY OF RIGHT TEMPORAL ARTERY;  Surgeon: Coralie Keens, MD;  Location: [place];  Service: General;  Laterality: Right;   COLON SURGERY     no per pt   DILATION AND CURETTAGE OF UTERUS  08/04/1974   MENISCUS REPAIR  10/03/2007   right knee   TONSILECTOMY, ADENOIDECTOMY, BILATERAL MYRINGOTOMY AND TUBES  age 89   TONSILLECTOMY     Family History  Problem Relation Age of Onset   Heart failure Mother    Diabetes Father    Hypertension Father    Cancer Father    Lung cancer Sister    Colon polyps Sister    Hypertension Sister    Hypertension Sister    Hypertension Sister    Hypertension Sister    Hypertension Sister    Hypertension Sister    Stroke Brother    Diabetes Brother    Stroke Maternal Grandmother    Heart disease Maternal Grandfather    Stomach cancer Neg Hx    Esophageal cancer Neg Hx    Colon cancer Neg Hx    Social History   Socioeconomic History   Marital status: Divorced    Spouse name: Not on file   Number of children: 3   Years of education: 12   Highest education level: High school graduate  Occupational History   Occupation: Retired    Comment: Med Tech   Tobacco Use   Smoking status: Never   Smokeless tobacco: Never  Vaping Use   Vaping Use: Never used  Substance and Sexual Activity   Alcohol use: No   Drug use: No   Sexual activity: Not Currently  Other Topics Concern   Not on file  Social History Narrative   Patient lives in [place] with her daughter and grandchildren.   Patient does not drive, daughter takes her to apts and shopping.    Patient enjoys playing mind games on her  phone and watching TV.    Social Determinants of Health   Financial Resource Strain: Low Risk  (07/30/2022)   Overall Financial Resource Strain (CARDIA)    Difficulty of Paying Living Expenses: Not hard at all  Food Insecurity: No Food Insecurity (07/30/2022)   Hunger Vital Sign    Worried About Running Out of Food in the Last Year: Never true    Ran Out of Food in the Last Year: Never  true  Transportation Needs: No Transportation Needs (07/30/2022)   PRAPARE - Hydrologist (Medical): No    Lack of Transportation (Non-Medical): No  Physical Activity: Inactive (07/30/2022)   Exercise Vital Sign    Days of Exercise per Week: 0 days    Minutes of Exercise per Session: 0 min  Stress: Stress Concern Present (07/30/2022)   [place]    Feeling of Stress : Rather much  Social Connections: Moderately Integrated (07/30/2022)   Social Connection and Isolation Panel [NHANES]    Frequency of Communication with Friends and Family: More than three times a week    Frequency of Social Gatherings with Friends and Family: Once a week    Attends Religious Services: More than 4 times per year    Active Member of [employer] or Organizations: Yes    Attends Music therapist: More than 4 times per year    Marital Status: Divorced    Tobacco Counseling Counseling given: Not Answered   Clinical Intake:  Pre-visit preparation completed: Yes  Pain : No/denies pain     BMI - recorded: 44.78 Nutritional Status: BMI > 30  Obese Diabetes: Yes CBG done?: No Did pt. bring in CBG monitor from home?: No  How often do you need to have someone help you when you read instructions, pamphlets, or other written materials from your doctor or pharmacy?: 1 - Never What is the last grade level you completed in school?: GED  Diabetic?yes  Interpreter Needed?: No  Information entered by :: S.Ahjanae Cassel  CMA   Activities of Daily Living    07/30/2022   11:16 AM  In your present state of health, do you have any difficulty performing the following activities:  Hearing? 0  Vision? 0  Difficulty concentrating or making decisions? 0  Walking or climbing stairs? 0  Dressing or bathing? 0  Doing errands, shopping? 0  Preparing Food and eating ? N  Using the Toilet? N  In the past six months, have you accidently leaked urine? N  Do you have problems with loss of bowel control? N  Managing your Medications? N  Managing your Finances? N  Housekeeping or managing your Housekeeping? N    Patient Care Team: Holley Bouche, MD as PCP - General (Family Medicine)  Indicate any recent Medical Services you may have received from other than [facility] providers in the past year (date may be approximate).     Assessment:   This is a routine wellness examination for Colisha.  Hearing/Vision screen Hearing Screening - Comments:: No concerns Vision Screening - Comments:: Due for eye exam  Dietary issues and exercise activities discussed: Current Exercise Habits: The patient does not participate in regular exercise at present, Exercise limited by: orthopedic condition(s)   Goals Addressed             This Visit's Progress    Exercise 3x per week (30 min per time)         Depression Screen    07/30/2022   11:12 AM 07/22/2022    1:47 PM 10/02/2021   11:27 AM 06/24/2021    1:40 PM 03/07/2021    1:31 PM 02/28/2021    1:44 PM 02/14/2021    1:49 PM  PHQ 2/9 Scores  PHQ - 2 Score _0 PHQ- 9 Score _1 Fall Risk  07/30/2022   11:16 AM 07/22/2022    1:48 PM 10/02/2021   11:27 AM 06/24/2021    1:40 PM 03/07/2021    1:32 PM  Fall Risk   Falls in the past year? _0 Number falls in past yr: 0 1 0 1 0  Comment  Patient recalls falling at least 4 times.     Injury with Fall? 0 0 0 0 0  Risk for fall due to :    History of fall(s)   Follow up    Falls  prevention discussed     FALL RISK PREVENTION PERTAINING TO THE HOME:  Any stairs in or around the home? No  If so, are there any without handrails?  N/z Home free of loose throw rugs in walkways, pet beds, electrical cords, etc? Yes  Adequate lighting in your home to reduce risk of falls? Yes   ASSISTIVE DEVICES UTILIZED TO PREVENT FALLS:  Life alert? No  Use of a cane, walker or w/c? Yes  Grab bars in the bathroom? No  Shower chair or bench in shower? No  Elevated toilet seat or a handicapped toilet? No  Cognitive Function:        06/02/2019    4:25 PM  6CIT Screen  What Year? 0 points  What month? 0 points  What time? 0 points  Count back from 20 0 points  Months in reverse 2 points  Repeat phrase 0 points  Total Score 2 points    Immunizations Immunization History  Administered Date(s) Administered   Influenza Split 05/04/2012   Influenza Whole 07/30/2010   Influenza,inj,Quad PF,6+ Mos 09/06/2013, 06/11/2015, 04/21/2016, 09/22/2017, 06/21/2018, 04/25/2019   Influenza-Unspecified 04/25/2019, 04/24/2022   PFIZER(Purple Top)SARS-COV-2 Vaccination 11/13/2019, 12/15/2019, 06/19/2020   Pfizer Covid-19 Vaccine Bivalent Booster 57yr & up 06/24/2021   Pneumococcal Polysaccharide-23 05/27/2011   Td 11/05/2009   Tdap 01/14/2021   Zoster Recombinat (Shingrix) 01/14/2021    TDAP status: Up to date  Flu Vaccine status: Up to date  Pneumococcal vaccine status: Declined,  Education has been provided regarding the importance of this vaccine but patient still declined. Advised may receive this vaccine at local pharmacy or Health Dept. Aware to provide a copy of the vaccination record if obtained from local pharmacy or Health Dept. Verbalized acceptance and understanding.   Covid-19 vaccine status: Information provided on how to obtain vaccines.   Qualifies for Shingles Vaccine? Yes   Zostavax completed No   Shingrix Completed?: No.    Education has been provided regarding  the importance of this vaccine. Patient has been advised to call insurance company to determine out of pocket expense if they have not yet received this vaccine. Advised may also receive vaccine at local pharmacy or Health Dept. Verbalized acceptance and understanding.  Screening Tests Health Maintenance  Topic Date Due   Diabetic kidney evaluation - Urine ACR  Never done   Medicare Annual Wellness (AWV)  06/01/2020   Zoster Vaccines- Shingrix (2 of 2) 03/11/2021   OPHTHALMOLOGY EXAM  04/02/2021   FOOT EXAM  01/10/2022   COVID-19 Vaccine (5 - 2023-24 season) 04/04/2022   Pneumonia Vaccine 67 Years old (2 - PCV) 10/03/2022 (Originally 12/28/2019)   HEMOGLOBIN A1C  01/21/2023   Diabetic kidney evaluation - eGFR measurement  07/23/2023   MAMMOGRAM  08/17/2023   COLONOSCOPY (Pts 45-479yrInsurance coverage will need to be confirmed)  01/31/2029   DTaP/Tdap/Td (3 - Td or Tdap) 01/15/2031   INFLUENZA VACCINE  Completed  DEXA SCAN  Completed   Hepatitis C Screening  Completed   HPV VACCINES  Aged Out    Health Maintenance  Health Maintenance Due  Topic Date Due   Diabetic kidney evaluation - Urine ACR  Never done   Medicare Annual Wellness (AWV)  06/01/2020   Zoster Vaccines- Shingrix (2 of 2) 03/11/2021   OPHTHALMOLOGY EXAM  04/02/2021   FOOT EXAM  01/10/2022   COVID-19 Vaccine (5 - 2023-24 season) 04/04/2022    Colorectal cancer screening: Type of screening: Colonoscopy. Completed 01/31/2022. Repeat every 7 years  Mammogram status: Completed 08/16/2021. Repeat every year    Lung Cancer Screening: (Low Dose CT Chest recommended if Age 91-80 years, 30 pack-year currently smoking OR have quit w/in 15years.) does not qualify.   Lung Cancer Screening Referral: n/a  Additional Screening:  Hepatitis C Screening: does not qualify; Completed 07/11/2019  Vision Screening: Recommended annual ophthalmology exams for early detection of glaucoma and other disorders of the eye. Is the  patient up to date with their annual eye exam?  No  Who is the provider or what is the name of the office in which the patient attends annual eye exams? Patient cannot remember If pt is not established with a provider, would they like to be referred to a provider to establish care? No .   Dental Screening: Recommended annual dental exams for proper oral hygiene  Community Resource Referral / Chronic Care Management: CRR required this visit?  No   CCM required this visit?  No      Plan:     I have personally reviewed and noted the following in the patient's chart:   Medical and social history Use of alcohol, tobacco or illicit drugs  Current medications and supplements including opioid prescriptions. Patient is not currently taking opioid prescriptions. Functional ability and status Nutritional status Physical activity Advanced directives List of other physicians Hospitalizations, surgeries, and ER visits in previous 12 months Vitals Screenings to include cognitive, depression, and falls Referrals and appointments  In addition, I have reviewed and discussed with patient certain preventive protocols, quality metrics, and best practice recommendations. A written personalized care plan for preventive services as well as general preventive health recommendations were provided to patient.     Amado Coe, [place]   07/30/2022   Patient in need of eye exam.  She will contact them to follow up.  She cannot remember name of facility.  She will talk to pharmacy about vaccinations.    I have reviewed this visit and agree with the documentation.  Dorris Singh, MD  Family Medicine Teaching Service

## 2022-07-31 NOTE — Addendum Note (Signed)
Addended by: Adalberto Cole E on: 07/31/2022 12:36 PM   Modules accepted: Level of Service

## 2022-08-06 ENCOUNTER — Other Ambulatory Visit: Payer: Self-pay | Admitting: Student

## 2022-08-06 NOTE — Telephone Encounter (Signed)
Please see the below message from [employer].- It appears that you will need to place a new order for sleep study.   Unfortunately the patient will have to show compliance and benefit before the insurance company will review for processing. If face 2 face notes showing usage and benefit are not able to be pulled we will need a new study for processing review.  Thank you,  Demetrius Charity

## 2022-08-11 ENCOUNTER — Telehealth: Payer: Self-pay | Admitting: Student

## 2022-08-11 NOTE — Telephone Encounter (Signed)
Called to discuss CPAP machine with patient. Patient needing a new CPAP, but will need to undergo another sleep study before insurance will cover another CPAP.   Patient to let provider know if she want's to continue w/ a sleep study, in hopes of getting new CPAP.

## 2022-08-12 ENCOUNTER — Other Ambulatory Visit: Payer: Self-pay | Admitting: Student

## 2022-08-12 DIAGNOSIS — G4733 Obstructive sleep apnea (adult) (pediatric): Secondary | ICD-10-CM

## 2022-08-12 NOTE — Telephone Encounter (Signed)
Patient returns call to nurse line.    Patient advised she will need a new sleep study.   Patient amendable to this.   Will forward to PCP.

## 2022-08-12 NOTE — Progress Notes (Signed)
Placed referral for sleep study so patient can be evaluated for new CPAP machine. Patient lost machine and is needing replacement. Order for CPAP placed, but insurance noted patient must have repeat sleep study before CPAP will be covered.

## 2022-08-18 ENCOUNTER — Encounter: Payer: Self-pay | Admitting: Pharmacist

## 2022-08-18 ENCOUNTER — Ambulatory Visit (INDEPENDENT_AMBULATORY_CARE_PROVIDER_SITE_OTHER): Payer: 59 | Admitting: Pharmacist

## 2022-08-18 VITALS — BP 154/78 | HR 70 | Wt 236.0 lb

## 2022-08-18 DIAGNOSIS — E119 Type 2 diabetes mellitus without complications: Secondary | ICD-10-CM | POA: Diagnosis not present

## 2022-08-18 DIAGNOSIS — I10 Essential (primary) hypertension: Secondary | ICD-10-CM

## 2022-08-18 DIAGNOSIS — Z794 Long term (current) use of insulin: Secondary | ICD-10-CM | POA: Diagnosis not present

## 2022-08-18 MED ORDER — DEXCOM G7 SENSOR MISC: 11 refills | Status: DC

## 2022-08-18 NOTE — Assessment & Plan Note (Signed)
Diabetes longstanding since 2010, currently uncontrolled. Patient is able to verbalize appropriate hypoglycemia management plan. Medication adherence appears poor. Control is suboptimal due to poor adherence from pill burden and confusion between injectable medications. -Increased dose of basal insulin Lantus (insulin glargine) to 30 units daily. -Restarted rapid insulin Humalog (insulin lispro) at 15 units daily with largest meal.  -Continued GLP-1 Ozempic (semaglutide) at 0.5 mg weekly, consider dose increase at next visit.  -Restarted SGLT2-I Jardiance (empagliflozin) at 25 mg. Counseled on sick day rules. -Patient interested in restarting CGM monitoring.  Discussed Dexcom G7 in place of previous G6.  New prescription sent to [facility] to re-initiate Dexcom.  PA for cover my meds will be required most likely.  - asked Kirby Funk, CPhT to assist.

## 2022-08-18 NOTE — Patient Instructions (Addendum)
Great to see you today.   Continue Ozempic (semaglutide) 0.'5mg'$  once weekly on Thursday.   Increase Lantus to 30 units once daily in the morning.   Re-Start Humalog 15 units prior to your evening meal (largest meal of the day).  Re-start Jardiance (empagliflozin) '25mg'$  once daily.   Please check your blood sugar with your current meter at least once daily.   At next visit, bring your glucose meter and all of your medications.

## 2022-08-18 NOTE — Progress Notes (Signed)
S:     Chief Complaint  Patient presents with   Medication Management    Diabetes - insulin   67 y.o. female who presents for diabetes evaluation, education, and management.  PMH is significant for type 2 diabetes and hypertension.   Patient was last seen and referred by Primary Care Provider, Dr. Marcina Millard, on 07/23/2023.    Today, patient arrives in good spirits and presents ambulating with a cane and reports using a walker at home.   Patient reports diabetes was diagnosed in 2010.   Current diabetes medications include: Lantus (insulin glargine) 25 units daily, empagliflozin (Jardiance) 25 mg daily (patient reports only taking 2x weekly), not currently taking prescribed Humalog (insulin lispro) 25 units daily with largest meal.  Current hypertension medications include: amlodipine 10 mg, hydrochlorothiazide 25 mg, lisinopril 40 mg daily Current hyperlipidemia medications include: atorvastatin 80 mg daily, Icosapent Ethyl 0.5 g BID   Patient denies adherence with Humalog (insulin lispro) due to confusion with Novolog (insulin aspart), has not taken in 3 weeks. Patient reports taking Jardiance (empagliflozin) only 2x weekly.     Do you feel that your medications are working for you? yes Have you been experiencing any side effects to the medications prescribed? no Do you have any problems obtaining medications due to transportation or finances? no Insurance coverage: [employer]  Patient denies hypoglycemic events.  Reported home fasting blood sugars: 200s  Reported 2 hour post-meal/random blood sugars: 270s .  Patient reported dietary habits: Eats 1 meals/day with multiple snacks.  Lunch: cheese, bag of chips Dinner: chicken and rice/brussel sprouts or broccoli; when daughter is not there to cook, patient eats a TV dinner  Snacks: cheetos, onion/potato chips, french fries, bag of candy Drinks: sprite zero, coffee, peach tea packets (not sure if zero  calorie)  Patient-reported exercise habits: Walking in her neighborhood (to [place]) once a month, walking to the trash. Patient is scared to ride the bus since having strokes and reports needing to use her complementary rides sparingly because she only gets 24 a year.   O:   Review of Systems  Constitutional:  Positive for malaise/fatigue.  Musculoskeletal:  Positive for back pain.  Psychiatric/Behavioral:  The patient has insomnia.     Physical Exam Constitutional:      Appearance: She is obese.  Pulmonary:     Effort: Pulmonary effort is normal.  Neurological:     Mental Status: She is alert.  Psychiatric:        Mood and Affect: Mood normal.        Behavior: Behavior normal.        Thought Content: Thought content normal.        Judgment: Judgment normal.     Lab Results  Component Value Date   HGBA1C 10.2 (A) 07/22/2022   Vitals:   08/18/22 0838  BP: (!) 154/78  Pulse: 70  SpO2: 100%    A/P: Diabetes longstanding since 2010, currently uncontrolled. Patient is able to verbalize appropriate hypoglycemia management plan. Medication adherence appears poor. Control is suboptimal due to poor adherence from pill burden and confusion between injectable medications. -Increased dose of basal insulin Lantus (insulin glargine) to 30 units daily. -Restarted rapid insulin Humalog (insulin lispro) at 15 units daily with largest meal.  -Continued GLP-1 Ozempic (semaglutide) at 0.5 mg weekly, consider dose increase at next visit.  -Restarted SGLT2-I Jardiance (empagliflozin) at 25 mg. Counseled on sick day rules. -Patient interested in restarting CGM monitoring.  Discussed Dexcom  G7 in place of previous G6.  New prescription sent to [facility] to re-initiate Dexcom.  PA for cover my meds will be required most likely.  - asked Kirby Funk, CPhT to assist.    Hypertension longstanding since 2008 currently uncontrolled. Blood pressure goal of <130/80 mmHg. Blood pressure  control is suboptimal due to potential of poor adherence (pill burden). - Plan to put patient on combo hypertension pill at f/u appointment.   Written patient instructions provided. Patient verbalized understanding of treatment plan.  Total time in face to face counseling 40 minutes.    Follow-up:  Pharmacist in 4 weeks. Patient seen with Dixon Boos, PharmD Candidate. Marland Kitchen

## 2022-08-18 NOTE — Assessment & Plan Note (Signed)
Hypertension longstanding since 2008 currently uncontrolled. Blood pressure goal of <130/80 mmHg. Blood pressure control is suboptimal due to potential of poor adherence (pill burden). - Plan to put patient on combo hypertension pill at f/u appointment.

## 2022-08-22 NOTE — Progress Notes (Signed)
Reviewed: I agree with Dr. Graylin Shiver documentation and management.

## 2022-09-03 ENCOUNTER — Encounter (HOSPITAL_COMMUNITY): Payer: Self-pay

## 2022-09-03 ENCOUNTER — Emergency Department (HOSPITAL_COMMUNITY): Payer: 59

## 2022-09-03 ENCOUNTER — Emergency Department (HOSPITAL_COMMUNITY)
Admission: EM | Admit: 2022-09-03 | Discharge: 2022-09-03 | Disposition: A | Discharge status: Home / Self Care | Payer: 59 | Attending: Student | Admitting: Student

## 2022-09-03 ENCOUNTER — Other Ambulatory Visit: Payer: Self-pay

## 2022-09-03 DIAGNOSIS — N189 Chronic kidney disease, unspecified: Secondary | ICD-10-CM | POA: Diagnosis not present

## 2022-09-03 DIAGNOSIS — E1122 Type 2 diabetes mellitus with diabetic chronic kidney disease: Secondary | ICD-10-CM | POA: Diagnosis not present

## 2022-09-03 DIAGNOSIS — Z7984 Long term (current) use of oral hypoglycemic drugs: Secondary | ICD-10-CM | POA: Insufficient documentation

## 2022-09-03 DIAGNOSIS — Z79899 Other long term (current) drug therapy: Secondary | ICD-10-CM | POA: Insufficient documentation

## 2022-09-03 DIAGNOSIS — Z794 Long term (current) use of insulin: Secondary | ICD-10-CM | POA: Diagnosis not present

## 2022-09-03 DIAGNOSIS — S82002A Unspecified fracture of left patella, initial encounter for closed fracture: Secondary | ICD-10-CM | POA: Diagnosis not present

## 2022-09-03 DIAGNOSIS — M25562 Pain in left knee: Secondary | ICD-10-CM | POA: Insufficient documentation

## 2022-09-03 DIAGNOSIS — I129 Hypertensive chronic kidney disease with stage 1 through stage 4 chronic kidney disease, or unspecified chronic kidney disease: Secondary | ICD-10-CM | POA: Insufficient documentation

## 2022-09-03 DIAGNOSIS — M25462 Effusion, left knee: Secondary | ICD-10-CM | POA: Diagnosis not present

## 2022-09-03 DIAGNOSIS — Z8673 Personal history of transient ischemic attack (TIA), and cerebral infarction without residual deficits: Secondary | ICD-10-CM | POA: Insufficient documentation

## 2022-09-03 DIAGNOSIS — Z7901 Long term (current) use of anticoagulants: Secondary | ICD-10-CM | POA: Diagnosis not present

## 2022-09-03 MED ORDER — LORAZEPAM 1 MG PO TABS
0.5000 mg | ORAL_TABLET | Freq: Once | ORAL | Status: AC
  Administered 2022-09-03: 0.5 mg via ORAL
  Filled 2022-09-03: qty 1

## 2022-09-03 MED ORDER — TRAMADOL HCL 50 MG PO TABS: 50.0000 mg | ORAL_TABLET | Freq: Four times a day (QID) | ORAL | 0 refills | Status: DC | PRN

## 2022-09-03 MED ORDER — OXYCODONE-ACETAMINOPHEN 5-325 MG PO TABS
1.0000 | ORAL_TABLET | Freq: Once | ORAL | Status: AC
  Administered 2022-09-03: 1 via ORAL
  Filled 2022-09-03: qty 1

## 2022-09-03 NOTE — ED Notes (Signed)
Patient transported to CT 

## 2022-09-03 NOTE — Progress Notes (Signed)
Orthopedic Tech Progress Note Patient Details:  [name] [birth year] [id number] Patient stated that she had previously used crutches and did not need to be trained on them once again.  Ortho Devices Type of Ortho Device: Crutches, Knee Sleeve Ortho Device/Splint Location: Left knee Ortho Device/Splint Interventions: Application   Post Interventions Patient Tolerated: Well  [name] [name] 09/03/2022, 8:20 PM

## 2022-09-03 NOTE — ED Provider Triage Note (Signed)
Emergency Medicine Provider Triage Evaluation Note  [name] , a 67 y.o. female  was evaluated in triage.  Pt complains of left knee pain.  Began 3 days ago and has progressively gotten worse.  States it was stiff this morning and she was unable to bear weight.  Has not taken anything for the pain today.  States she took Tylenol yesterday 1 time which provided minimal relief.  States she had the exact same symptoms on her right knee in 2010 and had to undergo orthopedic surgery.  Denies numbness or tingling.  Denies fevers or chills.  Rates pain at a 10 out of 10.  Review of Systems  Positive: As above Negative: As above  Physical Exam  BP (!) 192/103 (BP Location: Left Arm)   Pulse 90   Temp 98.4 F (36.9 C) (Oral)   Resp 16   Ht '5\' 2"'$  (1.575 m)   Wt 105.7 kg   SpO2 100%   BMI 42.62 kg/m  Gen:   Awake, no distress, resting comfortably in wheelchair and playing on her cell phone Resp:  Normal effort  MSK:   Moves extremities without difficulty  Other:  No overlying erythema, warmth or swelling  Medical Decision Making  Medically screening exam initiated at 2:48 PM.  Appropriate orders placed.  Joycie Aerts Diver was informed that the remainder of the evaluation will be completed by another provider, this initial triage assessment does not replace that evaluation, and the importance of remaining in the ED until their evaluation is complete.  X-ray left knee   Roylene Reason, [place] 09/03/22 1449

## 2022-09-03 NOTE — ED Provider Notes (Signed)
[facility] Provider Note   CSN: [id number] Arrival date & time: 09/03/22  1430     History  Chief Complaint  Patient presents with   Lt Knee Pain    [name] is a 67 y.o. female.  With past medical history of diabetes, TIA, atrial fibrillation anticoagulated on Xarelto, stroke, hypertension, CKD stage II who presents to the emergency department with left knee pain.  States that she has had left knee pain for the past 3 days.  She denies an inciting event.  States that she has had somewhat gradual but quick onset of sharp left-sided knee pain that is primarily in the back and the medial aspect of the knee.  She states that she was at church yesterday and her leg gave out on her due to the pain.  She has been attempting to walk with her cane but has been able to put minimal weight down on the leg.  She states that the pain is worse with extension.  She has no previous surgeries on the knee.  She denies any fevers, redness or swelling to the knee.  HPI     Home Medications Prior to Admission medications   Medication Sig Start Date End Date Taking? Authorizing Provider  traMADol (ULTRAM) 50 MG tablet Take 1 tablet (50 mg total) by mouth every 6 (six) hours as needed for severe pain. 09/03/22  Yes Mickie Hillier, PA-C  acetaminophen (TYLENOL) 500 MG tablet Take 1,000 mg by mouth every 6 (six) hours as needed for moderate pain.    [provider]  amLODipine (NORVASC) 10 MG tablet Take 1 tablet (10 mg total) by mouth daily. 02/19/22   Holley Bouche, MD  atorvastatin (LIPITOR) 80 MG tablet Take 1 tablet (80 mg total) by mouth daily. (Cholesterol) 04/16/22 01/11/23  Holley Bouche, MD  Blood Glucose Monitoring Suppl ([place]) w/Device KIT Check blood sugar 3 times per day. 03/14/21   Martyn Malay, MD  clobetasol ointment (TEMOVATE) 7.16 % Apply 1 application topically 2 (two) times daily. 08/05/20   Volney American, PA-C  Continuous Blood Gluc Sensor (DEXCOM G7 SENSOR) MISC Apply new sensor Q 10 days 08/18/22   Zenia Resides, MD  diclofenac Sodium (VOLTAREN) 1 % GEL Apply 2 g topically 4 (four) times daily. 04/11/20   Volney American, PA-C  empagliflozin (JARDIANCE) 25 MG TABS tablet Take 1 tablet (25 mg total) by mouth daily. 07/24/22   Holley Bouche, MD  gabapentin (NEURONTIN) 300 MG capsule TAKE THREE CAPSULES BY MOUTH THREE TIMES DAILY 07/11/22   Holley Bouche, MD  glucose blood (ONETOUCH VERIO) test strip use to test three times daily 04/14/22   Holley Bouche, MD  GNP ULTICARE PEN NEEDLES 31G X 8 MM MISC 1 applicator by Does not apply route 4 (four) times daily. 10/01/21   Holley Bouche, MD  hydrochlorothiazide (HYDRODIURIL) 25 MG tablet Take 0.5 tablets (12.5 mg total) by mouth daily. 10/02/21   Holley Bouche, MD  Icosapent Ethyl 0.5 g CAPS Take 1 capsule by mouth 2 (two) times daily. 07/01/22   Holley Bouche, MD  insulin glargine (LANTUS SOLOSTAR) 100 UNIT/ML Solostar Pen Inject 25 Units into the skin daily. 11/13/21   Holley Bouche, MD  insulin lispro (HUMALOG) 100 UNIT/ML KwikPen Inject 25 Units into the skin daily. Patient not taking: Reported on 08/18/2022 11/13/21   Holley Bouche, MD  lidocaine (LIDODERM) 5 % Place 1 patch onto the skin  daily. Remove & Discard patch within 12 hours or as directed by MD 09/03/20   Mullis, Kiersten P, DO  lisinopril (ZESTRIL) 40 MG tablet Take 1 tablet (40 mg total) by mouth daily. 10/02/21   Holley Bouche, MD  OneTouch Delica Lancets 28N MISC Check blood sugar 3 times daily 07/11/20   Mullis, Kiersten P, DO  Semaglutide,0.25 or 0.'5MG'$ /DOS, (OZEMPIC, 0.25 OR 0.5 MG/DOSE,) 2 MG/1.5ML SOPN Inject 0.5 mg weekly. 07/24/22   Holley Bouche, MD  XARELTO 20 MG TABS tablet Take 1 tablet (20 mg total) by mouth daily with supper. 08/12/22   Holley Bouche, MD      Allergies    Propoxyphene n-acetaminophen, Glipizide, and Metformin and related     Review of Systems   Review of Systems  Musculoskeletal:  Positive for arthralgias and gait problem.  All other systems reviewed and are negative.   Physical Exam Updated Vital Signs BP (!) 156/83 (BP Location: Left Arm)   Pulse 84   Temp 98.5 F (36.9 C) (Oral)   Resp 15   Ht '5\' 2"'$  (1.575 m)   Wt 105.7 kg   SpO2 99%   BMI 42.62 kg/m  Physical Exam Vitals and nursing note reviewed.  Constitutional:      General: She is not in acute distress.    Appearance: She is obese. She is not ill-appearing.  HENT:     Head: Normocephalic and atraumatic.  Eyes:     General: No scleral icterus. Cardiovascular:     Pulses: Normal pulses.  Pulmonary:     Effort: Pulmonary effort is normal. No respiratory distress.  Musculoskeletal:        General: Tenderness present. No swelling or deformity.     Comments: Tenderness to palpation of the left medial knee and posterior knee.  There is no obvious swelling, erythema, warmth.  Decreased range of motion but is able to flex the knee, refuses to extend the knee.  Distal pulses intact.  Compartments are soft.  Skin:    General: Skin is warm and dry.     Capillary Refill: Capillary refill takes less than 2 seconds.     Findings: No erythema or rash.  Neurological:     General: No focal deficit present.     Mental Status: She is alert and oriented to person, place, and time. Mental status is at baseline.  Psychiatric:        Mood and Affect: Mood normal.        Behavior: Behavior normal.        Thought Content: Thought content normal.        Judgment: Judgment normal.     ED Results / Procedures / Treatments   Labs (all labs ordered are listed, but only abnormal results are displayed) Labs Reviewed - No data to display  EKG None  Radiology CT Knee Left Wo Contrast  Result Date: 09/03/2022 CLINICAL DATA:  Knee pain, stress fracture suspected, neg xray EXAM: CT OF THE LEFT KNEE WITHOUT CONTRAST TECHNIQUE: Multidetector CT imaging of  the left knee was performed according to the standard protocol. Multiplanar CT image reconstructions were also generated. RADIATION DOSE REDUCTION: This exam was performed according to the departmental dose-optimization program which includes automated exposure control, adjustment of the mA and/or kV according to patient size and/or use of iterative reconstruction technique. COMPARISON:  Prior knee radiograph earlier today. FINDINGS: Bones/Joint/Cartilage No acute fracture. No periosteal thickening to suggest stress reaction. Joint space narrowing of the medial tibiofemoral and patellofemoral  compartments. Mild tricompartmental peripheral spurring. No bony destructive change or focal lesion. There is a small knee joint effusion, no lipohemarthrosis. Ligaments Suboptimally assessed by CT. Muscles and Tendons No acute findings. No intramuscular collection. Soft tissues Unremarkable. IMPRESSION: 1. Tricompartmental osteoarthritis, most prominent in the medial tibiofemoral and patellofemoral compartments. 2. Small knee joint effusion. 3. No acute findings or evidence of stress fracture by CT. Electronically Signed   By: Keith Rake [name]   On: 09/03/2022 19:13   DG Knee Complete 4 Views Left  Result Date: 09/03/2022 CLINICAL DATA:  Left knee pain. EXAM: LEFT KNEE - COMPLETE 4+ VIEW COMPARISON:  None Available. FINDINGS: No evidence of fracture, dislocation, or joint effusion. Mild medial joint space narrowing is consistent with osteoarthritis. No bony lesions or destruction. Soft tissues are unremarkable. IMPRESSION: Mild medial joint space narrowing consistent with osteoarthritis. Electronically Signed   By: Aletta Edouard [name]   On: 09/03/2022 15:21    Procedures Procedures   Medications Ordered in ED Medications  LORazepam (ATIVAN) tablet 0.5 mg (0.5 mg Oral Given 09/03/22 1831)  oxyCODONE-acetaminophen (PERCOCET/ROXICET) 5-325 MG per tablet 1 tablet (1 tablet Oral Given 09/03/22 1831)    ED Course/  Medical Decision Making/ A&P   {                            Medical Decision Making Amount and/or Complexity of Data Reviewed Radiology: ordered.  Risk Prescription drug management.  Initial Impression and Ddx 67 year old female who presents to the emergency department with nontraumatic left knee pain.  On exam there is no obvious swelling, erythema, heat.  She has mild decreased range of motion.  Neurovascularly intact. Patient PMH that increases complexity of ED encounter: Obesity, diabetes, atrial fibrillation, hypertension, stroke Differential: Fracture, subluxation, arthritis, gout, pseudogout, gonococcal arthropathy, septic joint, ligamentous or tendinous injury  Interpretation of Diagnostics I independent reviewed and interpreted the labs as followed: Not indicated  - I independently visualized the following imaging with scope of interpretation limited to determining acute life threatening conditions related to emergency care: Plain film, which revealed medial joint space narrowing consistent with osteoarthritis.  CT of the knee shows tricompartmental arthritis  Patient Reassessment and Ultimate Disposition/Management Physical exam without evidence of septic joint.  Her symptoms are inconsistent with a septic joint.  There is no erythema, warmth.  She allows me to range the knee although it is painful.  No fever. No risk factor of gonococcal arthropathy. No history of gout, pseudogout although this does not rule these out. She had a plain film that was indicative of osteoarthritis.  Given that she has been able to barely bear weight, proceed with CT. CT with tricompartmental arthritis but no occult fracture. This may be all arthritic related pain.  She was given Percocet here with minimal relief of symptoms.  I provided her with a knee brace and crutches and will refer her to orthopedics.  Will give her a short course of opioid pain medication she cannot take NSAIDs because she  is anticoagulated on Xarelto.  Return precautions for worsening symptoms.  The patient has been appropriately medically screened and/or stabilized in the ED. I have low suspicion for any other emergent medical condition which would require further screening, evaluation or treatment in the ED or require inpatient management. At time of discharge the patient is hemodynamically stable and in no acute distress. I have discussed work-up results and diagnosis with patient and answered all questions. Patient  is agreeable with discharge plan. We discussed strict return precautions for returning to the emergency department and they verbalized understanding.     Patient management required discussion with the following services or consulting groups:  None  Complexity of Problems Addressed Acute complicated illness or Injury  Additional Data Reviewed and Analyzed Further history obtained from: Past medical history and medications listed in the EMR, Recent PCP notes, and Care Everywhere  Patient Encounter Risk Assessment Prescriptions  Final Clinical Impression(s) / ED Diagnoses Final diagnoses:  Acute pain of left knee    Rx / DC Orders ED Discharge Orders          Ordered    Ambulatory referral to Orthopedic Surgery        09/03/22 1957    traMADol (ULTRAM) 50 MG tablet  Every 6 hours PRN        09/03/22 1957              Mickie Hillier, PA-C 09/03/22 1958    [place], [place], MD 09/04/22 1249

## 2022-09-03 NOTE — Discharge Instructions (Signed)
You were seen in the emergency department today for knee pain.  The CT of your knee demonstrates that you likely have arthritis.  I have sent a referral to orthopedics.  Please call in the morning to ensure a follow-up appointment has been scheduled.  Usually we have provided you with a knee brace and crutches for comfort.  Please use this as needed.  I have also provided you a short course of pain medication called tramadol.  You have been prescribed a medication that is considered an opiate. Opiates are pain medications that should be used with caution. It is important that you do not drive while taking this medication as it can cause drowsiness and impaired reaction times. Do not mix this medication with benzodiazepine medications or alcohol as this can cause respiratory depression. Additionally, opiates have addicting properties to them. Please use medication as prescribed by your provider.  Please return to the emergency department for significantly worsening symptoms.

## 2022-09-03 NOTE — ED Triage Notes (Signed)
Pt came in via POV d/t Lt knee pain that is too painful to bear weight & she ends up dragging her leg. Rates pain 9/10, endorses a few recent falls, A/Ox4.

## 2022-09-03 NOTE — ED Notes (Signed)
Ortho techs at bedside applying brace and instructing patient on crutches use.

## 2022-09-08 ENCOUNTER — Telehealth: Payer: Self-pay

## 2022-09-08 NOTE — Patient Outreach (Signed)
  Care Coordination [facility] Note Transition Care Management Follow-up Telephone Call Date of discharge and from where: [facility] 09/04/22 How have you been since you were released from the hospital? "My knee still bothers me" Any questions or concerns? Yes- Patient owes the Ortho doctor she was referred to (Dr. Lucia Gaskins) $4,000 and she cannot make appointment.  Discussed with patient and she  has appointment at [facility] on 09/15/22 and will see if she can get a referral to another Ortho doctor.  Items Reviewed: Did the pt receive and understand the discharge instructions provided? Yes  Medications obtained and verified? Yes  Other? No  Any new allergies since your discharge? No  Dietary orders reviewed? No Do you have support at home? Yes   Home Care and Equipment/Supplies: Were home health services ordered? not applicable If so, what is the name of the agency? N/a  Has the agency set up a time to come to the patient's home? not applicable Were any new equipment or medical supplies ordered?  No What is the name of the medical supply agency? N/a Were you able to get the supplies/equipment? not applicable Do you have any questions related to the use of the equipment or supplies? No  Functional Questionnaire: (I = Independent and D = Dependent) ADLs: I  Bathing/Dressing- I  Meal Prep- I  Eating- I  Maintaining continence- I  Transferring/Ambulation- I  Managing Meds- I  Follow up appointments reviewed:  PCP Hospital f/u appt confirmed? Yes  Scheduled to see [name] on 09/15/22 @ 0830. [facility] f/u appt confirmed? No   Are transportation arrangements needed? No  If their condition worsens, is the pt aware to call PCP or go to the Emergency Dept.? Yes Was the patient provided with contact information for the PCP's office or ED? Yes Was to pt encouraged to call back with questions or concerns? Yes  SDOH assessments and interventions completed:   Yes SDOH Interventions Today     Flowsheet Row Most Recent Value  SDOH Interventions   Food Insecurity Interventions Intervention Not Indicated       Care Coordination Interventions:  No Care Coordination interventions needed at this time.   Encounter Outcome:  Pt. Visit Completed

## 2022-09-15 ENCOUNTER — Encounter: Payer: Self-pay | Admitting: Pharmacist

## 2022-09-15 ENCOUNTER — Other Ambulatory Visit (HOSPITAL_COMMUNITY): Payer: Self-pay

## 2022-09-15 ENCOUNTER — Ambulatory Visit (INDEPENDENT_AMBULATORY_CARE_PROVIDER_SITE_OTHER): Payer: 59 | Admitting: Pharmacist

## 2022-09-15 VITALS — BP 160/82 | HR 84 | Wt 235.8 lb

## 2022-09-15 DIAGNOSIS — E119 Type 2 diabetes mellitus without complications: Secondary | ICD-10-CM

## 2022-09-15 DIAGNOSIS — B0229 Other postherpetic nervous system involvement: Secondary | ICD-10-CM

## 2022-09-15 DIAGNOSIS — E1140 Type 2 diabetes mellitus with diabetic neuropathy, unspecified: Secondary | ICD-10-CM

## 2022-09-15 DIAGNOSIS — Z794 Long term (current) use of insulin: Secondary | ICD-10-CM

## 2022-09-15 DIAGNOSIS — I10 Essential (primary) hypertension: Secondary | ICD-10-CM | POA: Diagnosis not present

## 2022-09-15 DIAGNOSIS — E1142 Type 2 diabetes mellitus with diabetic polyneuropathy: Secondary | ICD-10-CM

## 2022-09-15 MED ORDER — DEXCOM G7 RECEIVER DEVI: 0 refills | Status: AC

## 2022-09-15 MED ORDER — OLMESARTAN-AMLODIPINE-HCTZ 40-10-25 MG PO TABS: 1.0000 | ORAL_TABLET | Freq: Every day | ORAL | 11 refills | Status: DC

## 2022-09-15 MED ORDER — GABAPENTIN 300 MG PO CAPS: ORAL_CAPSULE | ORAL | Status: DC

## 2022-09-15 MED ORDER — LIDOCAINE 5 % EX PTCH: 1.0000 | MEDICATED_PATCH | CUTANEOUS | 0 refills | Status: DC

## 2022-09-15 MED ORDER — SEMAGLUTIDE (1 MG/DOSE) 4 MG/3ML ~~LOC~~ SOPN: 1.0000 mg | PEN_INJECTOR | SUBCUTANEOUS | 11 refills | Status: DC

## 2022-09-15 NOTE — Assessment & Plan Note (Signed)
Diabetes longstanding currently uncontrolled (A1c 10.2% 07/22/2022). Patient is able to verbalize appropriate hypoglycemia management plan. Medication adherence appears poor. Control is suboptimal due to poor medication adherence with polypharmacy and not understanding importance of medications. -Continued basal insulin Lantus (insulin glargine) 25 units daily.  -Continued rapid insulin Humalog (insulin lispro) 25 units daily.  -Increased dose of GLP-1 Ozempic (semaglutide) to 1 mg weekly injection.  -Continued SGLT2-I Jardiance (empagliflozin) 25 mg daily. Counseled on sick day rules. -Continued gabapentin 300 mg, 2 capsules in the morning, 2 capsules in the afternoon, and three capsules at night.  -Sent Rx for dexcom 7 CGM receiver since patient's phone is not compatible with the dexcom 7 app. We plan to set the system up for her at f/u appointment.  -Patient educated on purpose, proper use, and potential adverse effects.  -Extensively discussed pathophysiology of diabetes, recommended lifestyle interventions, dietary effects on blood sugar control.  -Counseled on s/sx of and management of hypoglycemia.  -Next A1c anticipated 10/2022.

## 2022-09-15 NOTE — Assessment & Plan Note (Signed)
  Hypertension longstanding currently uncontrolled. Blood pressure goal of <130/80 mmHg. Medication adherence appears good. Blood pressure control is suboptimal due to pill burden and acute pain of left knee. -Discontinue lisinopril 40 mg daily -Discontinue amlodipine 10 mg daily -Discontinue HCTZ 25 mg daily -Start omlesartan-amlodipine-HCTZ 40-10-25 mg daily for benefit of pill burden.

## 2022-09-15 NOTE — Patient Instructions (Signed)
It was nice to see you today!  Your goal blood sugar is 80-130 before eating and less than 180 after eating.  Medication Changes: Begin Ozempic (semaglutide) 1 mg weekly injection. You can take 2 of your remaining doses as one from the pen that you have at home to equal 1 mg.  Begin taking Lantus (insulin glargine) 25 units daily. Begin taking Humalog (insulin lispro) 25 units daily.  Begin taking your gabapentin 2 capsules in the morning, 2 capsules in the afternoon, and 3 capsules at night.  Pick-up your dexcom receiver and we will set it up at your follow-up appointment.  Begin taking your Jardiance (empagliflozin) 25 mg daily.  Monitor blood sugars at home and keep a log (glucometer or piece of paper) to bring with you to your next visit.  Keep up the good work with diet and exercise. Aim for a diet full of vegetables, fruit and lean meats (chicken, [place], fish). Try to limit salt intake by eating fresh or frozen vegetables (instead of canned), rinse canned vegetables prior to cooking and do not add any additional salt to meals.

## 2022-09-15 NOTE — Progress Notes (Signed)
S:     Chief Complaint  Patient presents with   Medication Management    Diabetes f/u   67 y.o. female who presents for diabetes evaluation, education, and management.  PMH is significant for T2DM, diabetic neuropathy, HTN, HLD, and chronic back pain.  Patient was referred and last seen by Primary Care Provider, Dr. Marcina Millard, on 07/22/2022.  At last visit with [facility], we continued patient on Ozempic (semaglutide) 0.5 mg weekly. We also increased basal insulin Lantus (insulin glargine) to 30 units daily and restarted Humalog (insulin lispro) 15 units once daily with largest meal.  Patient reports taking insulins differently - Lantus (insulin glargine) 25 units daily and Humalog (insulin lispro) 25 units daily.   Today, patient arrives in good spirits and presents ambulating with a cane due to pain in her left knee.   Current diabetes medications include: Jardiance (empagliflozin) 25 mg daily, Lantus (insulin glargine) 25 units daily, Humalog (insulin lispro) 25 units daily, Ozempic (semaglutide) 0.5 mg weekly. Current hypertension medications include: amlodipine 10 mg daily, HCTZ 25 mg daily, lisinopril 40 mg daily Current hyperlipidemia medications include: atorvastatin 80 mg daily, icosapent ethyl (Vascepa) 0.5 g twice daily  Patient denies adherence with medications, reports missing insulins and SGLT2i multiple times per week. Patient reports taking both insulins only 3x per week, and taking Jardiance (empagliflozin) 25 mg every other day.   Patient denies hypoglycemic events. Patient currently not checking blood sugars and has not connected to Dexcom 7 CGM.   O:   Review of Systems  Musculoskeletal:  Positive for back pain and joint pain (of the left knee - Patient was wearing a brace today and limping).    Physical Exam Constitutional:      Appearance: Normal appearance. She is obese.  Pulmonary:     Effort: Pulmonary effort is normal.     Breath sounds: Normal breath  sounds.  Neurological:     Mental Status: She is alert.  Psychiatric:        Mood and Affect: Mood normal.        Behavior: Behavior normal.        Thought Content: Thought content normal.        Judgment: Judgment normal.    Lab Results  Component Value Date   HGBA1C 10.2 (A) 07/22/2022   There were no vitals filed for this visit.  Lipid Panel     Component Value Date/Time   CHOL 173 07/22/2022 1647   TRIG 92 07/22/2022 1647   HDL 47 07/22/2022 1647   CHOLHDL 3.7 07/22/2022 1647   CHOLHDL 3.3 09/16/2017 0302   VLDL 16 09/16/2017 0302   LDLCALC 109 (H) 07/22/2022 1647   LDLDIRECT 51 03/27/2016 1614    Clinical Atherosclerotic Cardiovascular Disease (ASCVD): Yes  The 10-year ASCVD risk score (Arnett DK, et al., 2019) is: 30%   Values used to calculate the score:     Age: 67 years     Sex: Female     Is Non-Hispanic African American: Yes     Diabetic: Yes     Tobacco smoker: No     Systolic Blood Pressure: [id number]     Is BP treated: Yes     HDL Cholesterol: 47 mg/dL     Total Cholesterol: 173 mg/dL    A/P: Diabetes longstanding currently uncontrolled (A1c 10.2% 07/22/2022). Patient is able to verbalize appropriate hypoglycemia management plan. Medication adherence appears poor. Control is suboptimal due to poor medication adherence with polypharmacy and not  understanding importance of medications. -Continued basal insulin Lantus (insulin glargine) 25 units daily.  -Continued rapid insulin Humalog (insulin lispro) 25 units daily.  -Increased dose of GLP-1 Ozempic (semaglutide) to 1 mg weekly injection.  -Continued SGLT2-I Jardiance (empagliflozin) 25 mg daily. Counseled on sick day rules. -Continued gabapentin 300 mg, 2 capsules in the morning, 2 capsules in the afternoon, and three capsules at night.  -Sent Rx for dexcom 7 CGM receiver since patient's phone is not compatible with the dexcom 7 app. We plan to set the system up for her at f/u appointment.  -Patient  educated on purpose, proper use, and potential adverse effects.  -Extensively discussed pathophysiology of diabetes, recommended lifestyle interventions, dietary effects on blood sugar control.  -Counseled on s/sx of and management of hypoglycemia.  -Next A1c anticipated 10/2022.   ASCVD risk - secondary prevention in patient with diabetes. Last calculated LDL was 109 mg/dL; not at goal of <70 mg/dL. ASCVD risk factors include T2DM, HTN, and HLD and 10-year ASCVD risk score of 30%. high intensity statin indicated.  -Continued atorvastatin 80 mg. -Continued Icosapent ethyl (Vascepa) 0.5 g twice daily -Counseled on importance of avoiding grapefruit juice due to drug interaction. Told patient she could drink half a glass (4 oz.) per day.   -Consider attaining lipid panel at f/u  Hypertension longstanding currently uncontrolled. Blood pressure goal of <130/80 mmHg. Medication adherence appears good. Blood pressure control is suboptimal due to pill burden and acute pain of left knee. -Discontinue lisinopril 40 mg daily -Discontinue amlodipine 10 mg daily -Discontinue HCTZ 25 mg daily -Start omlesartan-amlodipine-HCTZ 40-10-25 mg daily for benefit of pill burden.   Patient has chronic back pain noted in 2019. Medications include tylenol 500 mg every 6 hours as needed, gabapentin 300 mg three capsules TID, and lidocaine 5% patch. Adherence seems good but patient reports running out of lidocaine patches. -Restart lidocaine patch 1 patch onto skin daily - remove after 12 hours -Continued gabapentin 300 mg, 2 capsules in the morning, 2 capsules in the afternoon, and three capsules at night.  Written patient instructions provided. Patient verbalized understanding of treatment plan.  Total time in face to face counseling 40 minutes.    Follow-up:  Pharmacist 2 weeks. Patient seen with Dixon Boos, PharmD Candidate.

## 2022-09-15 NOTE — Progress Notes (Signed)
Reviewed: I agree with Dr. Koval's documentation and management. 

## 2022-09-17 ENCOUNTER — Telehealth: Payer: Self-pay

## 2022-09-17 NOTE — Patient Outreach (Signed)
  Care Coordination   09/17/2022 Name: [name] MRN: [id number] DOB: [birth year]   Care Coordination Outreach Attempts:  An unsuccessful telephone outreach was attempted today to offer the patient information about available care coordination services as a benefit of their health plan.   Follow Up Plan:  Additional outreach attempts will be made to offer the patient care coordination information and services.   Encounter Outcome:  No Answer   Care Coordination Interventions:  No, not indicated    Lazaro Arms RN, BSN, [facility]   Phone: [phone]

## 2022-09-29 ENCOUNTER — Ambulatory Visit (INDEPENDENT_AMBULATORY_CARE_PROVIDER_SITE_OTHER): Payer: 59 | Admitting: Pharmacist

## 2022-09-29 ENCOUNTER — Encounter: Payer: Self-pay | Admitting: Pharmacist

## 2022-09-29 ENCOUNTER — Other Ambulatory Visit: Payer: Self-pay | Admitting: Student

## 2022-09-29 VITALS — BP 123/75 | HR 84 | Wt 231.2 lb

## 2022-09-29 DIAGNOSIS — E1140 Type 2 diabetes mellitus with diabetic neuropathy, unspecified: Secondary | ICD-10-CM

## 2022-09-29 DIAGNOSIS — Z794 Long term (current) use of insulin: Secondary | ICD-10-CM | POA: Diagnosis not present

## 2022-09-29 DIAGNOSIS — I10 Essential (primary) hypertension: Secondary | ICD-10-CM | POA: Diagnosis not present

## 2022-09-29 DIAGNOSIS — E119 Type 2 diabetes mellitus without complications: Secondary | ICD-10-CM

## 2022-09-29 DIAGNOSIS — E1169 Type 2 diabetes mellitus with other specified complication: Secondary | ICD-10-CM | POA: Diagnosis not present

## 2022-09-29 DIAGNOSIS — E1142 Type 2 diabetes mellitus with diabetic polyneuropathy: Secondary | ICD-10-CM

## 2022-09-29 MED ORDER — GABAPENTIN (ONCE-DAILY) 900 MG PO TABS: 1.0000 | ORAL_TABLET | Freq: Three times a day (TID) | ORAL | 3 refills | Status: AC

## 2022-09-29 NOTE — Assessment & Plan Note (Signed)
Diabetic neuropathy- longstanding currently uncontrolled. Medication adherence appears okay. Patient reports taking abapentin 300 mg 2 caps q AM, 2 caps q afternoon, and 1 cap QHS.  -Continue gabapentin 300 mg, 2 capsules in the morning, 2 capsules in the afternoon, and 3 capsules at night until your supply runs out -THEN, start gabapentin 900 mg tablets and take 3 times daily.

## 2022-09-29 NOTE — Progress Notes (Signed)
Reviewed and agree with Dr Graylin Shiver plan.

## 2022-09-29 NOTE — Assessment & Plan Note (Signed)
Diabetes longstanding, currently uncontrolled. Patient is able to verbalize appropriate hypoglycemia management plan. Medication adherence appears poor. Control is suboptimal due to polypharmacy and confusion with how to take different medications. -Decreased basal insulin Lantus (insulin glargine) back to previously prescribed 25 units daily in the morning.  -Decreased rapid insulin Humalog (insulin lispro) back to previously prescribed 25 units daily with largest meal.  -Continued GLP-1 Ozempic (semaglutide) 1 mg weekly .  -Continued SGLT2-I Jardiance (empagliflozin) 25 mg. Counseled on sick day rules.  -Patient instructed to pick up dexcom G7 receiver and set-up the system so we can look at CGM report @ f/u. -Patient educated on purpose, proper use, and potential adverse effects.

## 2022-09-29 NOTE — Assessment & Plan Note (Signed)
Hypertension longstanding currently controlled. Blood pressure at goal of <130/80  mmHg. Medication adherence appears good.  -Continued olmesartan-amlopidine-HCTZ 40-10-25 mg daily.

## 2022-09-29 NOTE — Progress Notes (Signed)
S:     Chief Complaint  Patient presents with   Medication Management    diabetes   67 y.o. female who presents for diabetes evaluation, education, and management.  PMH is significant for T2DM, diabetic neuropathy, HTN and HLD.  Patient was referred and last seen by Primary Care Provider, Dr. Marcina Millard, on 08/12/2022.  Patient was last seen at Rx clinic on 09/15/2022.  At last visit, patient was continued on same insulin regimen, Jardiance (empagliflozin) and gabapentin. Patient was titrated on Ozempic (semaglutide) from 0.5 mg to 1 mg weekly.    Today, patient arrives in good spirits and presents ambulating with a cane.  Current diabetes medications include: empagliflozin 25 mg, Lantus (insulin glargine) 25 units daily, Humalog (insulin lispro) 25 units daily, Ozempic (semaglutide) 1 mg weekly. Current neuropathy medications include: gabapentin 300 mg TID (2 caps q AM, 2 caps q afternoon, and 3 caps QHS)  Current hypertension medications include: olmesartan-amlodipine-HCTZ 40-10-25 mg daily  Current hyperlipidemia medications include: atorvastatin 80 mg, Icosapent Ethyl 0.5 g   Patient denies adherence with medications. Patient reports taking Lantus (insulin glargine) 33 units about three times a week, Humalog (insulin lispro) 33 units TID with meals about three times a week, gabapentin 300 mg 2 caps q AM, 2 caps q afternoon, and 1 cap QHS. Patient also reported intermittent adherence to atorvastatin. Patient reports taking all other medications as directed.   Insurance coverage: [employer] - dual complete  Patient denies hypoglycemic events.  Patient denies nocturia.  Patient reports neuropathy (nerve pain).  O:   Review of Systems  Musculoskeletal:  Positive for joint pain (arthritis pain of left knee; pain reported to be 6/10).  Neurological:  Positive for tingling (diabetic neuropathy).    Physical Exam Constitutional:      Appearance: Normal appearance. She  is obese.  Pulmonary:     Effort: Pulmonary effort is normal.     Breath sounds: Normal breath sounds.  Neurological:     Mental Status: She is alert.  Psychiatric:        Mood and Affect: Mood normal.        Behavior: Behavior normal.        Thought Content: Thought content normal.        Judgment: Judgment normal.    Lab Results  Component Value Date   HGBA1C 10.2 (A) 07/22/2022   Vitals:   09/29/22 0940  BP: 123/75  Pulse: 84  SpO2: 100%    Lipid Panel     Component Value Date/Time   CHOL 173 07/22/2022 1647   TRIG 92 07/22/2022 1647   HDL 47 07/22/2022 1647   CHOLHDL 3.7 07/22/2022 1647   CHOLHDL 3.3 09/16/2017 0302   VLDL 16 09/16/2017 0302   LDLCALC 109 (H) 07/22/2022 1647   LDLDIRECT 51 03/27/2016 1614    Clinical Atherosclerotic Cardiovascular Disease (ASCVD): No  The 10-year ASCVD risk score (Arnett DK, et al., 2019) is: 17.3%   Values used to calculate the score:     Age: 14 years     Sex: Female     Is Non-Hispanic African American: Yes     Diabetic: Yes     Tobacco smoker: No     Systolic Blood Pressure: [id number] mmHg     Is BP treated: No     HDL Cholesterol: 47 mg/dL     Total Cholesterol: 173 mg/dL    A/P: Diabetes longstanding, currently uncontrolled. Patient is able to verbalize appropriate hypoglycemia management  plan. Medication adherence appears poor. Control is suboptimal due to polypharmacy and confusion with how to take different medications. -Decreased basal insulin Lantus (insulin glargine) back to previously prescribed 25 units daily in the morning.  -Decreased rapid insulin Humalog (insulin lispro) back to previously prescribed 25 units daily with largest meal.  -Continued GLP-1 Ozempic (semaglutide) 1 mg weekly .  -Continued SGLT2-I Jardiance (empagliflozin) 25 mg. Counseled on sick day rules.  -Patient instructed to pick up dexcom G7 receiver and set-up the system so we can look at CGM report @ f/u. -Patient educated on purpose, proper  use, and potential adverse effects.  -Extensively discussed pathophysiology of diabetes, recommended lifestyle interventions, dietary effects on blood sugar control.  -Emphasized importance of taking insulins (and all medications) every day, as directed.  -Counseled on s/sx of and management of hypoglycemia.  -Next A1c anticipated March 2024.   Diabetic neuropathy- longstanding currently uncontrolled. Medication adherence appears okay. Patient reports taking abapentin 300 mg 2 caps q AM, 2 caps q afternoon, and 1 cap QHS.  -Continue gabapentin 300 mg, 2 capsules in the morning, 2 capsules in the afternoon, and 3 capsules at night until your supply runs out -THEN, start gabapentin 900 mg tablets and take 3 times daily.  ASCVD risk - primary prevention in patient with diabetes. Last LDL is 109 mg/dL; not at goal of <70 mg/dL. ASCVD risk factors include T2DM, HTN and HLD; and 10-year ASCVD risk score of 17.3. high intensity statin indicated.  -Continued atorvastatin 80 mg -Continued Icosapent Ethyl 0.5 g BID   Hypertension longstanding currently controlled. Blood pressure at goal of <130/80  mmHg. Medication adherence appears good.  -Continued olmesartan-amlopidine-HCTZ 40-10-25 mg daily.  Written patient instructions provided. Patient verbalized understanding of treatment plan.  Total time in face to face counseling 30 minutes.   Follow-up:  Pharmacist: patient will call to schedule in next couple weeks. Patient seen with Francena Hanly, PharmD PGY-1 Pharmacy Resident and Dixon Boos, PharmD Candidate.

## 2022-09-29 NOTE — Patient Instructions (Addendum)
It was nice to see you today!  Your goal blood sugar is 80-130 before eating and less than 180 after eating.  Medication Changes: Continue gabapentin 300 mg, 2 capsules in the morning, 2 capsules in the afternoon, and 3 capsules at night until your supply runs out. THEN, pick up gabapentin 900 mg tablets and take 3 times daily.  Pick-up dexcom G7 receiver. Follow the instructions to set up glucose monitor symptoms.  Start taking your Lantus (insulin galrgine) 25 units every day.  Start taking your Humalog (insulin lispro) 25 units once daily with your largest meal.   Continue taking Ozempic (semaglutide) every Sunday.   Monitor blood sugars at home and keep a log (glucometer or piece of paper) to bring with you to your next visit.  Keep up the good work with diet and exercise. Aim for a diet full of vegetables, fruit and lean meats (chicken, [place], fish). Try to limit salt intake by eating fresh or frozen vegetables (instead of canned), rinse canned vegetables prior to cooking and do not add any additional salt to meals.

## 2022-10-03 ENCOUNTER — Telehealth: Payer: Self-pay

## 2022-10-03 ENCOUNTER — Other Ambulatory Visit (HOSPITAL_COMMUNITY): Payer: Self-pay

## 2022-10-03 NOTE — Telephone Encounter (Signed)
A Prior Authorization was initiated for this patients GRALISE through CoverMyMeds.   Key: [id number]

## 2022-10-03 NOTE — Telephone Encounter (Signed)
Prior Auth for patients medication GRALISE approved by [employer] from 10/03/22 to 08/04/23.  CoverMyMeds Key: [id number] [place] Case ID #: [id number]

## 2022-10-11 ENCOUNTER — Other Ambulatory Visit: Payer: Self-pay | Admitting: Student

## 2022-10-11 DIAGNOSIS — I10 Essential (primary) hypertension: Secondary | ICD-10-CM

## 2022-10-14 ENCOUNTER — Other Ambulatory Visit: Payer: Self-pay | Admitting: Family Medicine

## 2022-10-14 DIAGNOSIS — B0229 Other postherpetic nervous system involvement: Secondary | ICD-10-CM

## 2022-10-16 ENCOUNTER — Ambulatory Visit (INDEPENDENT_AMBULATORY_CARE_PROVIDER_SITE_OTHER): Payer: 59 | Admitting: Family Medicine

## 2022-10-16 VITALS — BP 112/82 | HR 103 | Ht 62.0 in | Wt 227.4 lb

## 2022-10-16 DIAGNOSIS — M1712 Unilateral primary osteoarthritis, left knee: Secondary | ICD-10-CM | POA: Diagnosis not present

## 2022-10-16 MED ORDER — METHYLPREDNISOLONE ACETATE 40 MG/ML IJ SUSP
40.0000 mg | Freq: Once | INTRAMUSCULAR | Status: AC
  Administered 2022-10-16: 40 mg via INTRAMUSCULAR

## 2022-10-16 MED ORDER — DICLOFENAC SODIUM 1 % EX GEL: 4.0000 g | Freq: Four times a day (QID) | CUTANEOUS | 0 refills | Status: DC | PRN

## 2022-10-16 NOTE — Patient Instructions (Addendum)
It was nice seeing you today!  Your knee pain is most likely due to arthritis.  He received a steroid injection in your knee today.  Hopefully this will help with your pain and will hopefully last for at least 3 months.  Please follow-up in the next 2 to 3 weeks.  If doing better I think we can have you start some physical therapy.  In the meantime you can use Voltaren gel on your knee up to 4 times a day as needed.  Stay well, Zola Button, MD [facility] [phone]  --  Make sure to check out at the front desk before you leave today.  Please arrive at least 15 minutes prior to your scheduled appointments.  If you had blood work today, I will send you a MyChart message or a letter if results are normal. Otherwise, I will give you a call.  If you had a referral placed, they will call you to set up an appointment. Please give [place] a call if you don't hear back in the next 2 weeks.  If you need additional refills before your next appointment, please call your pharmacy first.

## 2022-10-16 NOTE — Progress Notes (Signed)
    SUBJECTIVE:   CHIEF COMPLAINT / HPI:  Chief Complaint  Patient presents with   Knee Pain    Patient was seen in the ED on 09/03/2022 for left knee pain ongoing for 3 days without known injury.  Today, patient reports that she is still having significant left knee pain.  Having difficulty with ambulation secondary to pain.  She has been doing the lidocaine patch which helps minimally but still having significant pain at night. She endorses morning stiffness that does go away in less than 30 minutes.  PERTINENT  PMH / PSH: Poorly controlled T2DM, HTN, HLD, atrial fibrillation on rivaroxaban, OSA, CKD stage II, depression X-ray left knee 09/03/2022 shows mild medial joint space narrowing consistent with osteoarthritis CT left knee 09/03/2022 shows tricompartmental osteoarthritis, small knee joint effusion, no evidence of stress fracture   Patient Care Team: Holley Bouche, MD as PCP - General (Family Medicine)   OBJECTIVE:   BP 112/82   Pulse (!) 103   Ht 5\' 2"  (1.575 m)   Wt 227 lb 6.4 oz (103.1 kg)   SpO2 100%   BMI 41.59 kg/m   Physical Exam Constitutional:      General: She is not in acute distress. Cardiovascular:     Rate and Rhythm: Normal rate.  Pulmonary:     Effort: Pulmonary effort is normal. No respiratory distress.  Musculoskeletal:     Cervical back: Neck supple.     Comments: Left knee: No obvious swelling or deformity.  Tenderness to palpation along the medial joint line.  Extension of the knee is slightly limited secondary to pain.  Pain with resisted extension of the knee.  4/5 strength with flexion.  Negative varus/valgus stressing.  Negative anterior and posterior drawer.  Negative Lachman.  Gait is abnormal ambulating with cane with slight bend to both knees secondary to pain.  Neurological:     Mental Status: She is alert.      Knee Injection Procedure Note  Pre-operative Diagnosis: left OA  Post-operative Diagnosis: same  Indications: Symptom  relief from osteoarthritis  Procedure Details   Written consent was obtained for the procedure. The joint was prepped with Betadine. A 25 gauge needle was inserted into the anterior aspect of the joint from a medial approach. 4 ml 1% lidocaine and 1 ml of  40 mg methylprednisolone  was then injected into the joint through the same needle. The needle was removed and the area cleansed and dressed.  Complications:  None; patient tolerated the procedure well.   {Show previous vital signs (optional):23777}    ASSESSMENT/PLAN:   Problem List Items Addressed This Visit       Musculoskeletal and Integument   Primary osteoarthritis of left knee - Primary    Symptoms, exam and imaging consistent with OA, severely limiting daily activities.  I did discuss that corticosteroid injection will likely raise her blood sugars given her poorly controlled diabetes but with shared decision making patient would like to proceed with injection. - corticosteroid injection performed today, see procedure note above - trial diclofenac gel - avoiding NSAIDs as she is on anticoagulation - follow-up in 2-3 weeks, will plan to initiate PT if improving      Relevant Medications   diclofenac Sodium (VOLTAREN) 1 % GEL       Return in about 3 weeks (around 11/06/2022) for f/u L knee pain.   Zola Button, MD [place]

## 2022-10-16 NOTE — Addendum Note (Signed)
Addended by: Zola Button D on: 10/16/2022 03:14 PM   Modules accepted: Orders

## 2022-10-16 NOTE — Assessment & Plan Note (Signed)
Symptoms, exam and imaging consistent with OA, severely limiting daily activities.  I did discuss that corticosteroid injection will likely raise her blood sugars given her poorly controlled diabetes but with shared decision making patient would like to proceed with injection. - corticosteroid injection performed today, see procedure note above - trial diclofenac gel - avoiding NSAIDs as she is on anticoagulation - follow-up in 2-3 weeks, will plan to initiate PT if improving

## 2022-10-29 ENCOUNTER — Other Ambulatory Visit: Payer: Self-pay | Admitting: Student

## 2022-10-29 DIAGNOSIS — I10 Essential (primary) hypertension: Secondary | ICD-10-CM

## 2022-11-06 ENCOUNTER — Ambulatory Visit (INDEPENDENT_AMBULATORY_CARE_PROVIDER_SITE_OTHER): Payer: 59 | Admitting: Student

## 2022-11-06 ENCOUNTER — Encounter: Payer: Self-pay | Admitting: Student

## 2022-11-06 VITALS — BP 120/64 | HR 94 | Wt 226.0 lb

## 2022-11-06 DIAGNOSIS — M1712 Unilateral primary osteoarthritis, left knee: Secondary | ICD-10-CM | POA: Diagnosis not present

## 2022-11-06 NOTE — Assessment & Plan Note (Signed)
Pain well controlled and managed with Lidocaine patches during the day and voltaren gel at night. She reports good pain relief with conservative therapy and from the knee injection on 3/14. No signs of infection from injection site.

## 2022-11-06 NOTE — Progress Notes (Signed)
    SUBJECTIVE:   CHIEF COMPLAINT / HPI:   [name] is a 67 y.o. female  presenting for follow up for knee pain.   She is using lidocaine patches and voltaren gel with good relief. She denies recent falls.   PERTINENT  PMH / PSH: Reviewed   OBJECTIVE:   BP 120/64   Pulse 94   Wt 226 lb (102.5 kg)   SpO2 98%   BMI 41.34 kg/m   Chronically ill-appearing, no acute distress Cardio: Regular rate, regular rhythm, no murmurs on exam. Pulm: Clear, no wheezing, no crackles. No increased work of breathing MSK: normal ROM in bilateral knees with no unilateral swelling, mildly tender on the medial aspect of the left knee, lidocaine patch applied  Extremities: no peripheral edema    ASSESSMENT/PLAN:   Primary osteoarthritis of left knee Pain well controlled and managed with Lidocaine patches during the day and voltaren gel at night. She reports good pain relief with conservative therapy and from the knee injection on 3/14. No signs of infection from injection site.      Darci Current, [place]

## 2022-11-06 NOTE — Patient Instructions (Signed)
It was great to see you today!   Today we addressed: Continue the lidocaine patches and Voltaren gel as needed.  You should return to our clinic Return in about 6 months (around 05/08/2023).  Please arrive 15 minutes before your appointment to ensure smooth check in process.    Please call the clinic at [phone] if your symptoms worsen or you have any concerns.  Thank you for allowing me to participate in your care, Dr. Darci Current [facility]

## 2022-11-10 ENCOUNTER — Other Ambulatory Visit: Payer: Self-pay | Admitting: Student

## 2022-11-10 DIAGNOSIS — I10 Essential (primary) hypertension: Secondary | ICD-10-CM

## 2022-11-14 ENCOUNTER — Other Ambulatory Visit: Payer: Self-pay | Admitting: Student

## 2022-11-14 DIAGNOSIS — E1169 Type 2 diabetes mellitus with other specified complication: Secondary | ICD-10-CM

## 2022-11-24 ENCOUNTER — Telehealth: Payer: Self-pay

## 2022-11-24 NOTE — Telephone Encounter (Signed)
Called pt to schedule a F/U for her diabetes on Tuesday 12/09/22 at 9:50am. Penni Bombard

## 2022-12-02 ENCOUNTER — Other Ambulatory Visit: Payer: Self-pay | Admitting: Family Medicine

## 2022-12-02 ENCOUNTER — Other Ambulatory Visit: Payer: Self-pay | Admitting: Student

## 2022-12-02 DIAGNOSIS — B0229 Other postherpetic nervous system involvement: Secondary | ICD-10-CM

## 2022-12-02 DIAGNOSIS — E1169 Type 2 diabetes mellitus with other specified complication: Secondary | ICD-10-CM

## 2022-12-02 DIAGNOSIS — M1712 Unilateral primary osteoarthritis, left knee: Secondary | ICD-10-CM

## 2022-12-09 ENCOUNTER — Encounter: Payer: Self-pay | Admitting: Student

## 2022-12-09 ENCOUNTER — Ambulatory Visit (INDEPENDENT_AMBULATORY_CARE_PROVIDER_SITE_OTHER): Payer: 59 | Admitting: Student

## 2022-12-09 VITALS — BP 126/76 | HR 96 | Ht 62.0 in | Wt 227.6 lb

## 2022-12-09 DIAGNOSIS — K5900 Constipation, unspecified: Secondary | ICD-10-CM | POA: Diagnosis not present

## 2022-12-09 DIAGNOSIS — Z794 Long term (current) use of insulin: Secondary | ICD-10-CM | POA: Diagnosis not present

## 2022-12-09 DIAGNOSIS — E1142 Type 2 diabetes mellitus with diabetic polyneuropathy: Secondary | ICD-10-CM | POA: Diagnosis not present

## 2022-12-09 DIAGNOSIS — I10 Essential (primary) hypertension: Secondary | ICD-10-CM | POA: Diagnosis not present

## 2022-12-09 DIAGNOSIS — E119 Type 2 diabetes mellitus without complications: Secondary | ICD-10-CM | POA: Diagnosis not present

## 2022-12-09 DIAGNOSIS — E785 Hyperlipidemia, unspecified: Secondary | ICD-10-CM

## 2022-12-09 DIAGNOSIS — R5381 Other malaise: Secondary | ICD-10-CM | POA: Diagnosis not present

## 2022-12-09 DIAGNOSIS — E1169 Type 2 diabetes mellitus with other specified complication: Secondary | ICD-10-CM | POA: Diagnosis not present

## 2022-12-09 LAB — HEMOGLOBIN A1C: Hemoglobin A1C: 11.2

## 2022-12-09 NOTE — Patient Instructions (Signed)
It was great to see you! Thank you for allowing me to participate in your care!  I recommend that you always bring your medications to each appointment as this makes it easy to ensure we are on the correct medications and helps [place] not miss when refills are needed.  Our plans for today:  - Diabetes  Regimen -Lantus 25 units in am -Humalog 25 units w/ largest meal (maybe dinner when daughter cooks)   -Ozempic 1 mg every week   -Jardiance 25 mg daily  Make follow up in appointment in 1 month   -Diet and Diabetes I'm a little concerned about your eating habits. It doesn't seem like you are getting enough regular meals in during the day/week. This will help better control your diabetes and weight.   Make appointment with our dietician, as soon as possible Dr. Wyona Almas   Phone: [phone]  Knee and Back Pain Use lidocaine patches on knees daily, even if you have no plans to go out/be active. This will help with your baseline pain and make you more likely to get up/move around/be active  -Consider Physical Therapy (PT) for knees and back PT - can help strengthen muscles/increase range of motion/function of joints. Although can be an uncomfortable and difficult process     Take care and seek immediate care sooner if you develop any concerns.   Dr. Bess Kinds, MD [facility]

## 2022-12-09 NOTE — Progress Notes (Unsigned)
  SUBJECTIVE:   CHIEF COMPLAINT / HPI:   DM Meds: -Lantus 25 units in am > taking it soso, and doing 33 units -Humalog 25 units daily with largest meal > taking it soso and doing 35 units -Ozempic 1 mg weekly -Jardiance 25 mg daily CBG range: 300's Eye exam: Saw recently.   DM Neuropathy Meds: gabapentin 900 mg TID  HTN Meds: olmesartan-amlopidine-HCTZ 40-10-25 mg daily   HLD Meds: atorvastatin 80 mg Icosapent Ethyl 0.5 g BID   Constipation 4 days since last BM. Having bloating. Having small pellet stools. This has been an issue.   PERTINENT  PMH / PSH: DM, HTN, HLD    Patient Care Team: Bess Kinds, MD as PCP - General (Family Medicine) OBJECTIVE:  There were no vitals taken for this visit. Physical Exam   ASSESSMENT/PLAN:  There are no diagnoses linked to this encounter. No follow-ups on file. Bess Kinds, MD 12/09/2022, 7:42 AM PGY-***, [facility] {    This will disappear when note is signed, click to select method of visit    :1}

## 2022-12-10 DIAGNOSIS — R5381 Other malaise: Secondary | ICD-10-CM | POA: Insufficient documentation

## 2022-12-10 MED ORDER — POLYETHYLENE GLYCOL 3350 17 GM/SCOOP PO POWD: 17.0000 g | Freq: Every day | ORAL | 3 refills | Status: DC

## 2022-12-10 NOTE — Assessment & Plan Note (Signed)
Reports compliance with meds.  -Continue atorvastatin 80 mg, Icosapent Ethyl 0.5 g BID

## 2022-12-10 NOTE — Assessment & Plan Note (Signed)
Patient notes she's been constipated and feeling bloated. Her last BM was 4 days ago and was mostly small pellets. Patient reports this has been an ongoing issue for a while. Will start stool softener and have patient message me if no BM after 3 days.  -Miralax 17 g daily -Consider colace if still no BM

## 2022-12-10 NOTE — Assessment & Plan Note (Addendum)
Patient had not been taking medication regularly or as prescribed and was indulging in sweets recently, causing A1c to rise to 12 from 8. Patient wanting to restart medication and warned about dangers of hyperglycemia/poorly controlled DM. Reviewed meds with patient. Patient would likely benefit from close f/u as she has significant social stressors at baseline and issues w/ remembering dosing of meds. Patient also has poor diet, mostly missing meals and snacking on chips and sprite zero. Patient eats ~ 1 meal a day, and would benefit from education on diet and diabetes. Will refer to clinic RD.  -Lantus 25 units in am  -Humalog 25 units daily with largest meal  -Ozempic 1 mg weekly -Jardiance 25 mg daily -F/u 1 month -Ref to RD, Dr. Gerilyn Pilgrim

## 2022-12-10 NOTE — Assessment & Plan Note (Signed)
Is now taking gabapentin 900 mg TID. Still reports burning and tingling in feet. Will attempt to regain better DM control to see if this helps w/ symptoms.  -Continue gabapentin 900 mg TID

## 2022-12-10 NOTE — Assessment & Plan Note (Signed)
Patient noted to have significantly sedentary lifestyle, 2/2 to not wanting to get up and walk around from pain in her knees and back. She reports some relief w/ lidocaine patches, but is so bothered by her knee pain that she prefers not to be active. Consoled patient to place lidocaine patches on knee's in am, that we she could be active if she chose to, w/ less issue/pain. Also spoke w/ patient about PT for knee and back/deconditioning to help regain function and mobility. Patient to consider. -Lidocaine patches to knee's in am -Consider PT

## 2022-12-10 NOTE — Assessment & Plan Note (Signed)
Patient reports good compliance w/ meds. BP well controlled.  -Continue olmesartan-amlopidine-HCTZ 40-10-25 mg daily

## 2023-01-03 ENCOUNTER — Other Ambulatory Visit: Payer: Self-pay | Admitting: Student

## 2023-01-03 DIAGNOSIS — E1169 Type 2 diabetes mellitus with other specified complication: Secondary | ICD-10-CM

## 2023-01-12 ENCOUNTER — Other Ambulatory Visit: Payer: Self-pay | Admitting: Student

## 2023-01-12 DIAGNOSIS — M1712 Unilateral primary osteoarthritis, left knee: Secondary | ICD-10-CM

## 2023-01-12 NOTE — Progress Notes (Unsigned)
  SUBJECTIVE:   CHIEF COMPLAINT / HPI:   F/u DM Last seen 12/09/22, was not taking meds consistently and eating a lot of sweets Meds: -Lantus 25 units in am  -Humalog 25 units daily with largest meal  -Ozempic 1 mg weekly -Jardiance 25 mg daily  Ref to RD, Dr. 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 Nelle Don.    Hypercholesteremia    Hypertension    Hypertension    Hypertensive urgency 05/2006   Stroke (cerebrum) (HCC)    TIA (transient ischemic attack) 2017    Patient Care Team: Bess Kinds, MD as PCP - General (Family Medicine) OBJECTIVE:  There were no vitals taken for this visit. Physical Exam   ASSESSMENT/PLAN:  There are no diagnoses linked to this encounter. No follow-ups on file. Bess Kinds, MD 01/12/2023, 6:15 PM PGY-***, [facility] {    This will disappear when note is signed, click to select method of visit    :1}

## 2023-01-12 NOTE — Patient Instructions (Signed)
It was great to see you! Thank you for allowing me to participate in your care!  I recommend that you always bring your medications to each appointment as this makes it easy to ensure we are on the correct medications and helps [place] not miss when refills are needed.  Our plans for today:  - Diabetes Regimen -Lantus 30 units in am  -Humalog 25 units daily with largest meal  -Ozempic 1 mg weekly -Jardiance 25 mg daily Follow up 1 month Make appoint with Dr. Gerilyn Pilgrim our nutritionist, to review diet for diabetes  Dr. Wyona Almas  Phone: [phone]  -Knee pain Will send referral for Physical Therapy. Will see if we can regain some strength and balance. May be uncomfortable, but the long term benefit is worth it, if we can gain some function back     Take care and seek immediate care sooner if you develop any concerns.   Dr. Bess Kinds, MD [facility]

## 2023-01-13 ENCOUNTER — Encounter: Payer: Self-pay | Admitting: Student

## 2023-01-13 ENCOUNTER — Ambulatory Visit (INDEPENDENT_AMBULATORY_CARE_PROVIDER_SITE_OTHER): Payer: 59 | Admitting: Student

## 2023-01-13 ENCOUNTER — Encounter: Payer: Self-pay | Admitting: Family Medicine

## 2023-01-13 VITALS — BP 120/76 | HR 86 | Ht 62.0 in | Wt 225.5 lb

## 2023-01-13 DIAGNOSIS — M1711 Unilateral primary osteoarthritis, right knee: Secondary | ICD-10-CM

## 2023-01-13 DIAGNOSIS — Z794 Long term (current) use of insulin: Secondary | ICD-10-CM | POA: Diagnosis not present

## 2023-01-13 DIAGNOSIS — M1712 Unilateral primary osteoarthritis, left knee: Secondary | ICD-10-CM

## 2023-01-13 DIAGNOSIS — E119 Type 2 diabetes mellitus without complications: Secondary | ICD-10-CM | POA: Diagnosis not present

## 2023-01-15 NOTE — Assessment & Plan Note (Signed)
Patient notes pain in both knees bilaterally, and also appreciates right leg has been giving out sporadically causing her to almost fall.  Patient says lidocaine patches helped but is not using it frequently.  Knee exam without effusion, some tenderness to palpation on medial joint line, normal range of motion.  Patient with history of OA, it seems to be a chronic issue for patient.  Will recommend patient follow-up with PT for see about gaining some strength, mobility, balance back. - Ambulatory referral to PT

## 2023-01-15 NOTE — Assessment & Plan Note (Signed)
Patient comes in for follow-up on her diabetes.  Notes good compliance with meds, stating that CBGs ranged around 200s at home.  Patient better able to keep an eye on blood sugar using CGM.  Patient still not eating regular meals, with poor diet, but willing to see clinic RD.  Patient due for next A1c check in 2 months, will continue close follow-up to be sure diabetes regimen is being stuck 2. - Lantus 30 units daily - Humalog 25 units daily with largest meal - Ozempic 1 mg weekly - Jardiance 25 mg daily - Follow-up 1 month - Make appointment with RD, Dr. Gerilyn Pilgrim

## 2023-01-27 ENCOUNTER — Other Ambulatory Visit: Payer: Self-pay | Admitting: Student

## 2023-01-27 DIAGNOSIS — B0229 Other postherpetic nervous system involvement: Secondary | ICD-10-CM

## 2023-02-02 ENCOUNTER — Other Ambulatory Visit: Payer: Self-pay | Admitting: Student

## 2023-02-02 DIAGNOSIS — I10 Essential (primary) hypertension: Secondary | ICD-10-CM

## 2023-02-06 ENCOUNTER — Other Ambulatory Visit: Payer: Self-pay | Admitting: Student

## 2023-02-06 DIAGNOSIS — I10 Essential (primary) hypertension: Secondary | ICD-10-CM

## 2023-02-09 ENCOUNTER — Ambulatory Visit: Payer: 59 | Admitting: Student

## 2023-02-11 ENCOUNTER — Ambulatory Visit (INDEPENDENT_AMBULATORY_CARE_PROVIDER_SITE_OTHER): Payer: 59 | Admitting: Student

## 2023-02-11 ENCOUNTER — Encounter: Payer: Self-pay | Admitting: Student

## 2023-02-11 VITALS — BP 129/82 | HR 77 | Ht 62.0 in | Wt 226.2 lb

## 2023-02-11 DIAGNOSIS — E119 Type 2 diabetes mellitus without complications: Secondary | ICD-10-CM | POA: Diagnosis not present

## 2023-02-11 DIAGNOSIS — E639 Nutritional deficiency, unspecified: Secondary | ICD-10-CM

## 2023-02-11 DIAGNOSIS — Z794 Long term (current) use of insulin: Secondary | ICD-10-CM

## 2023-02-11 NOTE — Progress Notes (Signed)
SUBJECTIVE:   CHIEF COMPLAINT / HPI:   DM -Last A1c 11.2 on check at home 12/09/22 Meds: - Lantus 30 units daily - Humalog 25 units daily with largest meal - Victoza 1 mg weekly - Jardiance 25 mg daily Was to f/u w/ Dr. Gerilyn Pilgrim Today: Is taking Lantus 25 units daily, instead of 30 units. Reports good compliance with meds. CBG checks ranging around 120-140's.   Diet Issues Patient reports that she is regularly eating, noting that she will eat fruit during the day, and nothing else.  Patient does not like to prepare meals, as she has knee pain with standing.  Patient also notes that she has new dentures, and is having issues with getting accustomed to them.  Patient also noting that she is feeling more tired recently and has less energy.  PERTINENT  PMH / PSH:    Patient Care Team: Bess Kinds, MD as PCP - General (Family Medicine) OBJECTIVE:  BP 129/82   Pulse 77   Ht 5\' 2"  (1.575 m)   Wt 226 lb 3 oz (102.6 kg)   SpO2 100%   BMI 41.37 kg/m  Physical Exam Constitutional:      General: She is not in acute distress.    Appearance: Normal appearance. She is not ill-appearing.  Cardiovascular:     Rate and Rhythm: Normal rate and regular rhythm.     Pulses: Normal pulses.     Heart sounds: Normal heart sounds. No murmur heard.    No friction rub. No gallop.  Pulmonary:     Effort: Pulmonary effort is normal. No respiratory distress.     Breath sounds: Normal breath sounds. No stridor. No wheezing, rhonchi or rales.  Abdominal:     General: Bowel sounds are normal. There is no distension.     Palpations: Abdomen is soft. There is no mass.     Tenderness: There is no abdominal tenderness.  Neurological:     Mental Status: She is alert.  Psychiatric:        Mood and Affect: Mood normal.        Behavior: Behavior normal.      ASSESSMENT/PLAN:  Diabetes mellitus type 2, insulin dependent (HCC) Assessment & Plan: Patient presents for follow-up of her diabetes.  Patient  last A1c 11.2 on 12/09/2022, recheck in 1 month.  Patient was not taking previously recommended increase dose of Lantus, as it was changed last time.  Discussed new dose of Lantus patient is in agreement.  Patient CBG checks at home ranging 120s to 140s, patient is getting closer to goal.  Will have patient use new Lantus regiment. - Lantus 30 units daily - Humalog 25 units with largest meal - Victoza 1 mg weekly - Jardiance 25 mg daily - Follow-up 1 month, check A1c   Poor nutrition Assessment & Plan: Patient reports poor diet, noting that she snacks during the day, and will mostly eat fruit, and may be chips.  Patient does not like to cook as being up on her feet causes knee pain.  Patient's daughter no longer preparing meals for her, as she is busy.  Patient down 10 pounds unintentionally since January.  Concern for malnutrition as patient has poor diet, and home support.  Discussed pain management of knees to help patient get more active, and feel more comfortable cooking.  Also discussed balanced diet, increasing protein, and effect of sugars on glucose.  Patient to expand diet, and consider RD consult. - Expand diet, 2 meals a day,  good protein source - Consider RD consult, with Dr. Gerilyn Pilgrim    No follow-ups on file. Bess Kinds, MD 02/18/2023, 4:33 PM PGY-3, [facility]

## 2023-02-11 NOTE — Patient Instructions (Signed)
It was great to see you! Thank you for allowing me to participate in your care!  I recommend that you always bring your medications to each appointment as this makes it easy to ensure we are on the correct medications and helps [place] not miss when refills are needed.  Our plans for today:  - Diabetes  Regimen: - Lantus 30 units daily (was previously 25 units) - Humalog 25 units daily with largest meal - Victoza 1 mg weekly - Jardiance 25 mg daily -Follow up in 4 weeks  -Knee pain Put lidocaine patches on knees first thing every morning. Even if they are not hurting. This will help control pain, if you decide to move around, or do something that causes knee pain.   -Expand Diet/Eat Your losing weight in an unsafe way, because you are not eating enough. You need to eat balanced meals, with protein, carbohydrate, and vegetables. This is why you have less energy/feel tired.  -For now we will start with protein. Eat something with meat or beans (deli meat, roasted meat, soups with meat).   -Eat at least 2 meals a day, not just fruit   Take care and seek immediate care sooner if you develop any concerns.   Dr. Bess Kinds, MD [facility]

## 2023-02-18 DIAGNOSIS — E639 Nutritional deficiency, unspecified: Secondary | ICD-10-CM | POA: Insufficient documentation

## 2023-02-18 NOTE — Assessment & Plan Note (Signed)
Patient presents for follow-up of her diabetes.  Patient last A1c 11.2 on 12/09/2022, recheck in 1 month.  Patient was not taking previously recommended increase dose of Lantus, as it was changed last time.  Discussed new dose of Lantus patient is in agreement.  Patient CBG checks at home ranging 120s to 140s, patient is getting closer to goal.  Will have patient use new Lantus regiment. - Lantus 30 units daily - Humalog 25 units with largest meal - Victoza 1 mg weekly - Jardiance 25 mg daily - Follow-up 1 month, check A1c

## 2023-02-18 NOTE — Assessment & Plan Note (Signed)
Patient reports poor diet, noting that she snacks during the day, and will mostly eat fruit, and may be chips.  Patient does not like to cook as being up on her feet causes knee pain.  Patient's daughter no longer preparing meals for her, as she is busy.  Patient down 10 pounds unintentionally since January.  Concern for malnutrition as patient has poor diet, and home support.  Discussed pain management of knees to help patient get more active, and feel more comfortable cooking.  Also discussed balanced diet, increasing protein, and effect of sugars on glucose.  Patient to expand diet, and consider RD consult. - Expand diet, 2 meals a day, good protein source - Consider RD consult, with Dr. Gerilyn Pilgrim

## 2023-02-24 NOTE — Therapy (Addendum)
OUTPATIENT PHYSICAL THERAPY LOWER EXTREMITY EVALUATION + NO VISIT DISCHARGE SUMMARY (see below)    Patient Name: [name] MRN: [id number] DOB:1956-04-09, 67 y.o., female Today's Date: 02/25/2023  END OF SESSION:  PT End of Session - 02/25/23 1447     Visit Number 1    Number of Visits 9    Date for PT Re-Evaluation 04/22/23    Authorization Type [employer]    PT Start Time 1447    PT Stop Time 1529    PT Time Calculation (min) 42 min    Activity Tolerance Patient tolerated treatment well;No increased pain    Behavior During Therapy [facility] for tasks assessed/performed             Past Medical History:  Diagnosis Date   Arthritis    Atrial fibrillation (HCC)    Cataract    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 Nelle Don.    Hypercholesteremia    Hypertension    Hypertension    Hypertensive urgency 05/2006   Stroke (cerebrum) (HCC)    TIA (transient ischemic attack) 2017   Past Surgical History:  Procedure Laterality Date   ARTERY BIOPSY Right 09/18/2017   Procedure: BIOPSY OF RIGHT TEMPORAL ARTERY;  Surgeon: Abigail Miyamoto, MD;  Location: [facility] OR;  Service: General;  Laterality: Right;   COLON SURGERY     no per pt   DILATION AND CURETTAGE OF UTERUS  08/04/1974   MENISCUS REPAIR  10/03/2007   right knee   TONSILECTOMY, ADENOIDECTOMY, BILATERAL MYRINGOTOMY AND TUBES  age 51   TONSILLECTOMY     Patient Active Problem List   Diagnosis Date Noted   Poor nutrition 02/18/2023   Physical deconditioning 12/10/2022   Primary osteoarthritis of left knee 10/16/2022   Diabetes mellitus type 2, insulin dependent (HCC) 10/02/2021   Diabetic neuropathy (HCC) 06/21/2018   Chronic midline low back pain 06/21/2018   Mild obstructive  sleep apnea 11/18/2013   Constipation 05/28/2011   Atrial fibrillation (HCC) 06/07/2009   Depression 09/29/2008   Hyperlipidemia associated with type 2 diabetes mellitus (HCC) 08/22/2008   CHRONIC KIDNEY DISEASE STAGE II (MILD) 08/01/2008   Essential hypertension 04/16/2007    PCP: Bess Kinds, MD  REFERRING PROVIDER: Doreene Eland, MD  REFERRING DIAG: M17.11 (ICD-10-CM) - Primary osteoarthritis of right knee Referral comment: "Bilat knee pain, and RLE weakness/leg giving out "  THERAPY DIAG:  Chronic pain of both knees  Other abnormalities of gait and mobility  Muscle weakness (generalized)  Rationale for Evaluation and Treatment: Rehabilitation  ONSET DATE: less than a year   SUBJECTIVE:   SUBJECTIVE STATEMENT: Pt states she had a surgery on her R knee several years ago, has had some chronic L knee pain and seems over past several months both knees have gotten worse. Reports intermittent buckling of L knee that does not seem to coincide with her back pain. Denies any numbness.  Standing tolerance <10-55min Using cane full time for ~2 years. Was previously using walker in community. Has been going out into community less due to this pain over past few months. Previously was walking to the store (about a block or two), was using motorized scooter in store.   PERTINENT HISTORY: HTN, afib,  OSA, DM2 w/ neuropathy, CKD2 PAIN:  Are you having pain: 8/10 Location/description: R knee (also endorses L knee and low back unrated) Best-worst over past week: 8-10/10  - aggravating factors: weightbearing, getting in and out of chairs, dressing - Easing factors: rest, pressure (wrapping with warm washcloth), lidocaine patches   PRECAUTIONS: fall risk    WEIGHT BEARING RESTRICTIONS: No  FALLS:  Has patient fallen in last 6 months? Yes. 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 Tricompartmental osteoarthritis, most prominent in the medial tibiofemoral and patellofemoral compartments. 2. Small knee joint effusion. 3. No acute findings or evidence of stress fracture by CT."  PATIENT SURVEYS:  FOTO 52 current, 59 predicted  COGNITION: Overall cognitive status: Within functional limits for tasks assessed     SENSATION: Light touch intact BIL, mildly diminished LLE   EDEMA:  Mild swelling about L knee joint, not formally measured, no erythema   POSTURE: noted fwd trunk lean, forward head  PALPATION: Concordant TTP L knee patellar tendon and medial/lateral joint lines, no pain in quad or calf  No tenderness R knee   LOWER EXTREMITY ROM:     Active  Right eval Left eval  Hip flexion    Hip extension    Hip internal rotation    Hip external rotation    Knee extension Lacking 5 * A: lacking 25 seated P: lacking 8 deg limited by pain  Knee flexion 108 deg 91 deg  (Blank rows = not tested) (Key: WFL = within functional limits not formally assessed, * = concordant pain, s = stiffness/stretching sensation, NT = not tested)  Comments:    LOWER EXTREMITY MMT:    MMT Right eval Left eval  Hip flexion    Hip abduction (modified sitting) 4 4  Hip internal rotation    Hip external rotation    Knee flexion 4+ 4 *  Knee extension 4 3+ *  Ankle dorsiflexion     (Blank rows = not tested) (Key: WFL = within functional limits not formally assessed, * = concordant pain, s = stiffness/stretching sensation, NT = not tested)  Comments:    LOWER EXTREMITY SPECIAL TESTS:  deferred  FUNCTIONAL TESTS:  Sit to stand: increased time/effort with sit to stand,  requires B UE support, excessive fwd weight shift, increased WB through RLE  GAIT: Distance walked: within clinic Assistive device utilized: Single point cane Level of assistance: Complete Independence and Modified independence Comments: antalgic gait on RLE, step to pattern, reduced gait speed/cadence   TODAY'S TREATMENT:  [facility] Adult PT Treatment:                                                DATE: 02/25/23 Therapeutic exercise:  - seated quad set   - seated knee flexion AROM  - HEP handout + education   PATIENT EDUCATION:  Education details: Pt education on PT impairments, prognosis, and POC. Informed consent. Rationale for interventions, safe/appropriate HEP performance Person educated: Patient Education method: Explanation, Demonstration, Tactile cues, Verbal cues, and Handouts Education comprehension: verbalized understanding, returned demonstration, verbal cues required, tactile cues required, and needs further education    HOME EXERCISE PROGRAM: Access Code: [email or web address] URL: [email or web address] Date: 02/25/2023 Prepared by: Fransisco Hertz  Exercises - Seated Quad Set  - 2-3 x daily - 7 x weekly - 1 sets - 8 reps - Seated Heel Slide  - 2-3 x daily - 7 x weekly - 1 sets - 8 reps  ASSESSMENT:  CLINICAL IMPRESSION: Pt is a pleasant 67 year old woman who arrives to PT evaluation on this date for bilateral knee pain. Pt reports difficulty with basic mobility and ADLs due to pain. During today's session pt demonstrates limitations in LE strength/mobility bilaterally, particularly with L quad, which are likely contributing to difficulty with aforementioned activities. Recommend skilled PT to address aforementioned deficits to improve functional independence/tolerance. Tolerates HEP well without issue, no adverse events. Pt departs today's session  in no acute distress, all voiced questions/concerns addressed appropriately from PT perspective.    OBJECTIVE IMPAIRMENTS: Abnormal gait, decreased activity tolerance, decreased balance, decreased endurance, decreased mobility, difficulty walking, decreased ROM, decreased strength, improper body mechanics, and pain.   ACTIVITY LIMITATIONS: carrying, lifting, bending, sitting, standing, squatting, stairs, transfers, and locomotion level  PARTICIPATION LIMITATIONS: meal prep, cleaning, laundry, driving, shopping, and community activity  PERSONAL FACTORS: Age, Time since onset of injury/illness/exacerbation, and 3+ comorbidities: HTN, afib, OSA, DM2 w/ neuropathy, CKD2  are also affecting patient's functional outcome.   REHAB POTENTIAL: Fair given chronicity and comorbidities  CLINICAL DECISION MAKING: Evolving/moderate complexity  EVALUATION COMPLEXITY: Moderate   GOALS: Goals reviewed with patient? No  SHORT TERM GOALS: Target date: 03/25/2023 Pt will demonstrate appropriate understanding and performance of initially prescribed HEP in order to facilitate improved independence with management of symptoms.  Baseline: HEP provided on eval Goal status: INITIAL   2. Pt will score greater than or equal to 55 on FOTO in order to demonstrate improved perception of function due to symptoms.  Baseline: 52  Goal status: INITIAL    LONG TERM GOALS: Target date: 04/22/2023 Pt will score 59 or greater on FOTO in order to demonstrate improved perception of function due to symptoms.  Baseline: 52 Goal status: INITIAL  2.  Pt will demonstrate at least -5 to 110 degrees of knee AROM bilaterally in order to facilitate improved tolerance to functional movements. Baseline: see ROM chart above Goal status: INITIAL  3.  Pt will endorse ability to tolerate standing for >42min with less than 3 pt increase in knee pain in order to facilitate improved tolerance to ADLs.  Baseline: <10-63min per pt  Goal  status: INITIAL  4. Pt will demonstrate at least 4/5 knee ext/flex MMT bilaterally in order to improve functional strength.  Baseline: see MMT chart above  Goal status: INITIAL  5. Pt will be able to perform 5 consecutive sit to stands  with or without UE support from standard chair in order to facilitate improved safety w/ transfers.  Baseline: increased pain, significant time/effort with one sit to stand using UE support  Goal status: INITIAL   6. Pt will improve at least MCID on appropriate standardized functional outcome measure (such as TUG, 30sec STS) in order to indicate improved functional tolerance and reduced fall risk.  Baseline: TBD  Goal status: INITIAL   PLAN:  PT FREQUENCY: 1x/week  PT DURATION: 8 weeks  PLANNED INTERVENTIONS: Therapeutic exercises, Therapeutic activity, Neuromuscular re-education, Balance training, Gait training, Patient/Family education, Self Care, Joint mobilization, Stair training, Aquatic Therapy, Cryotherapy, Moist heat, Taping, Manual therapy, and Re-evaluation  PLAN FOR NEXT SESSION: Review/update HEP PRN. Work on quad activation, Dance movement psychotherapist and balance, safety with transfers.  Symptom modification strategies as indicated/appropriate. Do TUG vs 30sec STS, update goal accordingly   Ashley Murrain PT, DPT 02/25/2023 4:34 PM     Discharge addendum 06/19/2023  PHYSICAL THERAPY DISCHARGE SUMMARY  Visits from Start of Care: 1  Current functional level related to goals / functional outcomes: Unable to be assessed   Remaining deficits: Unable to be assessed   Education / Equipment: Unable to be assessed  Patient goals were unable to be assessed. Patient is being discharged due to not returning since the last visit.     Ashley Murrain PT, DPT 06/19/2023 11:28 AM

## 2023-02-25 ENCOUNTER — Encounter: Payer: Self-pay | Admitting: Physical Therapy

## 2023-02-25 ENCOUNTER — Other Ambulatory Visit: Payer: Self-pay

## 2023-02-25 ENCOUNTER — Ambulatory Visit: Payer: 59 | Attending: Family Medicine | Admitting: Physical Therapy

## 2023-02-25 DIAGNOSIS — M25562 Pain in left knee: Secondary | ICD-10-CM | POA: Diagnosis not present

## 2023-02-25 DIAGNOSIS — M1711 Unilateral primary osteoarthritis, right knee: Secondary | ICD-10-CM | POA: Diagnosis not present

## 2023-02-25 DIAGNOSIS — M6281 Muscle weakness (generalized): Secondary | ICD-10-CM | POA: Diagnosis not present

## 2023-02-25 DIAGNOSIS — G8929 Other chronic pain: Secondary | ICD-10-CM | POA: Diagnosis not present

## 2023-02-25 DIAGNOSIS — M25561 Pain in right knee: Secondary | ICD-10-CM | POA: Insufficient documentation

## 2023-02-25 DIAGNOSIS — R2689 Other abnormalities of gait and mobility: Secondary | ICD-10-CM | POA: Diagnosis not present

## 2023-03-11 ENCOUNTER — Ambulatory Visit: Payer: 59 | Admitting: Physical Therapy

## 2023-03-18 ENCOUNTER — Ambulatory Visit: Payer: 59 | Admitting: Physical Therapy

## 2023-03-24 ENCOUNTER — Other Ambulatory Visit: Payer: Self-pay | Admitting: Student

## 2023-03-24 DIAGNOSIS — M1712 Unilateral primary osteoarthritis, left knee: Secondary | ICD-10-CM

## 2023-03-25 ENCOUNTER — Ambulatory Visit: Payer: 59 | Admitting: Physical Therapy

## 2023-04-01 ENCOUNTER — Ambulatory Visit: Payer: 59 | Admitting: Physical Therapy

## 2023-04-28 ENCOUNTER — Other Ambulatory Visit: Payer: Self-pay | Admitting: Student

## 2023-04-28 DIAGNOSIS — B0229 Other postherpetic nervous system involvement: Secondary | ICD-10-CM

## 2023-05-21 ENCOUNTER — Other Ambulatory Visit: Payer: Self-pay | Admitting: Student

## 2023-05-21 DIAGNOSIS — E109 Type 1 diabetes mellitus without complications: Secondary | ICD-10-CM

## 2023-05-22 ENCOUNTER — Other Ambulatory Visit: Payer: Self-pay | Admitting: *Deleted

## 2023-05-22 DIAGNOSIS — I10 Essential (primary) hypertension: Secondary | ICD-10-CM

## 2023-05-22 MED ORDER — OLMESARTAN-AMLODIPINE-HCTZ 40-10-25 MG PO TABS: 1.0000 | ORAL_TABLET | Freq: Every day | ORAL | 11 refills | Status: DC

## 2023-05-26 ENCOUNTER — Other Ambulatory Visit: Payer: Self-pay | Admitting: Student

## 2023-05-26 DIAGNOSIS — I10 Essential (primary) hypertension: Secondary | ICD-10-CM

## 2023-05-26 DIAGNOSIS — M1712 Unilateral primary osteoarthritis, left knee: Secondary | ICD-10-CM

## 2023-05-26 NOTE — Telephone Encounter (Signed)
Pharmacy sent over fax requesting a 90 prescription for OLMSRT-AMLDPN 40-10-25 MG due to insurance billing. Please send the rx to [facility]. Thanks! Penni Bombard CMA

## 2023-06-09 NOTE — Progress Notes (Unsigned)
  SUBJECTIVE:   CHIEF COMPLAINT / HPI:   Diabetes F/u Meds: Lantus 30 units daily, Humalog 25 units, Victoza 1.2 mg weekly,  Jardiance 25 mg daily  Today Has actually been taking 25 units lantus, 25 units humalog, 1 mg Ozempic, jardiance daily CBG checks around 200's, A1c 9.3 today  HTN Meds: Olmesartan-amlodipine-HCTZ BP Readings from Last 3 Encounters:  06/10/23 128/84  02/11/23 129/82  01/13/23 120/76   Knee Pain Patient notes continued bilateral knee pain, with some relief using Voltaren cream.  Patient had poor experience when going to visit orthopedist, missed appointment, was waiting in lobby for 2-1/2 hours, they forgot to check her in.  Staff was unprofessional with patient, and patient was very displeased with interaction.  Patient still wanting to be seen for her knees, and is open to going back.  PERTINENT  PMH / PSH:    OBJECTIVE:  BP 128/84   Pulse 82   Ht 5\' 2"  (1.575 m)   Wt 230 lb 12.8 oz (104.7 kg)   SpO2 100%   BMI 42.21 kg/m  Physical Exam Constitutional:      General: She is not in acute distress.    Appearance: Normal appearance. She is not ill-appearing.  Musculoskeletal:     Right knee: No swelling, deformity, effusion, erythema, ecchymosis or lacerations. Normal range of motion. Tenderness present over the medial joint line and lateral joint line. No LCL laxity, MCL laxity, ACL laxity or PCL laxity.     Instability Tests: Anterior drawer test negative. Posterior drawer test negative.     Left knee: No swelling, deformity, effusion, erythema, ecchymosis or lacerations. Normal range of motion. Tenderness present over the medial joint line and lateral joint line. No LCL laxity, MCL laxity, ACL laxity or PCL laxity.    Instability Tests: Anterior drawer test negative. Posterior drawer test negative.  Neurological:     Mental Status: She is alert.      ASSESSMENT/PLAN:   Assessment & Plan Diabetes mellitus type 2, insulin dependent (HCC) Patient  comes in for follow-up of her diabetes.  Patient continues to struggle with taking the correct dose of medications.  Patient taking 25 units Lantus, was prescribed 30 units Lantus at last visit.  Will increase dose to 30 units again, discussed with patient.  Will also adjust dose of Ozempic.  Plan for close follow-up.  A1c improved today, 9.3 from 10.2 previously.  Patient appreciates eating a lot of candy secondary to poor diet, and holidays. - Lantus 30 units daily - Humalog 25 units with largest meal - Jardiance 25 mg daily - Ozempic 2 mg weekly - Follow-up 1 month Essential hypertension Reports compliance with medication, BP well-controlled, checking BMP today. - BMP Encounter for immunization Flu vaccine given  Primary osteoarthritis of left knee Patient continues to have bilateral knee pain.  Patient had attempted to see orthopedist, but was unable to be seen as she was not checked in for her visit, and waited 2-1/2 hours for visit.  Patient was very upset with professionalism of facility, but is willing to try again.  Provided resources for contact of orthopedist. - Follow-up with orthopedist - Lidocaine patches in the mornings on knees, 12 hrs - Voltaren gel, when not using lidocaine patches No follow-ups on file. Bess Kinds, MD 06/10/2023, 10:19 AM PGY-3, [facility]

## 2023-06-09 NOTE — Patient Instructions (Incomplete)
It was great to see you! Thank you for allowing me to participate in your care!  I recommend that you always bring your medications to each appointment as this makes it easy to ensure we are on the correct medications and helps us not miss when refills are needed.  Our plans for today:  - *** -   We are checking some labs today, I will call you if they are abnormal will send you a MyChart message or a letter if they are normal.  If you do not hear about your labs in the next 2 weeks please let us know.***  Take care and seek immediate care sooner if you develop any concerns.   Dr. Akeiba Axelson, MD [facility]  

## 2023-06-10 ENCOUNTER — Encounter: Payer: Self-pay | Admitting: Student

## 2023-06-10 ENCOUNTER — Ambulatory Visit: Payer: 59 | Admitting: Student

## 2023-06-10 ENCOUNTER — Other Ambulatory Visit: Payer: Self-pay

## 2023-06-10 VITALS — BP 128/84 | HR 82 | Ht 62.0 in | Wt 230.8 lb

## 2023-06-10 DIAGNOSIS — E119 Type 2 diabetes mellitus without complications: Secondary | ICD-10-CM

## 2023-06-10 DIAGNOSIS — Z794 Long term (current) use of insulin: Secondary | ICD-10-CM

## 2023-06-10 DIAGNOSIS — Z23 Encounter for immunization: Secondary | ICD-10-CM

## 2023-06-10 DIAGNOSIS — I10 Essential (primary) hypertension: Secondary | ICD-10-CM | POA: Diagnosis not present

## 2023-06-10 DIAGNOSIS — M1712 Unilateral primary osteoarthritis, left knee: Secondary | ICD-10-CM | POA: Diagnosis not present

## 2023-06-10 LAB — POCT GLYCOSYLATED HEMOGLOBIN (HGB A1C): HbA1c, POC (controlled diabetic range): 9.3 % — AB (ref 0.0–7.0)

## 2023-06-10 NOTE — Assessment & Plan Note (Addendum)
Reports compliance with medication, BP well-controlled, checking BMP today. - BMP

## 2023-06-10 NOTE — Assessment & Plan Note (Addendum)
Patient continues to have bilateral knee pain.  Patient had attempted to see orthopedist, but was unable to be seen as she was not checked in for her visit, and waited 2-1/2 hours for visit.  Patient was very upset with professionalism of facility, but is willing to try again.  Provided resources for contact of orthopedist. - Follow-up with orthopedist - Lidocaine patches in the mornings on knees, 12 hrs - Voltaren gel, when not using lidocaine patches

## 2023-06-10 NOTE — Assessment & Plan Note (Addendum)
Patient comes in for follow-up of her diabetes.  Patient continues to struggle with taking the correct dose of medications.  Patient taking 25 units Lantus, was prescribed 30 units Lantus at last visit.  Will increase dose to 30 units again, discussed with patient.  Will also adjust dose of Ozempic.  Plan for close follow-up.  A1c improved today, 9.3 from 10.2 previously.  Patient appreciates eating a lot of candy secondary to poor diet, and holidays. - Lantus 30 units daily - Humalog 25 units with largest meal - Jardiance 25 mg daily - Ozempic 2 mg weekly - Follow-up 1 month

## 2023-06-11 LAB — BASIC METABOLIC PANEL
BUN/Creatinine Ratio: 16 (ref 12–28)
BUN: 21 mg/dL (ref 8–27)
CO2: 26 mmol/L (ref 20–29)
Calcium: 9.5 mg/dL (ref 8.7–10.3)
Chloride: 102 mmol/L (ref 96–106)
Creatinine, Ser: 1.29 mg/dL — ABNORMAL HIGH (ref 0.57–1.00)
Glucose: 152 mg/dL — ABNORMAL HIGH (ref 70–99)
Potassium: 4.1 mmol/L (ref 3.5–5.2)
Sodium: 142 mmol/L (ref 134–144)
eGFR: 45 mL/min/{1.73_m2} — ABNORMAL LOW (ref >59)

## 2023-06-17 ENCOUNTER — Other Ambulatory Visit: Payer: Self-pay | Admitting: Student

## 2023-06-17 ENCOUNTER — Telehealth: Payer: Self-pay

## 2023-06-17 DIAGNOSIS — N182 Chronic kidney disease, stage 2 (mild): Secondary | ICD-10-CM

## 2023-06-17 NOTE — Telephone Encounter (Signed)
Patient calls nurse line to schedule repeat BMP.   I see note that states repeat BMP in one week from last week.   I do not see future orders.   Let me know when orders are placed and will call patient to schedule a lab only visit.

## 2023-06-17 NOTE — Telephone Encounter (Signed)
Patient scheduled for 11/15

## 2023-06-17 NOTE — Telephone Encounter (Signed)
Order for Future BMP placed. Patient can be scheduled for a lab visit any day!  Thank you!  -BS

## 2023-06-19 ENCOUNTER — Other Ambulatory Visit: Payer: Self-pay

## 2023-06-19 ENCOUNTER — Other Ambulatory Visit: Payer: 59

## 2023-06-19 DIAGNOSIS — E119 Type 2 diabetes mellitus without complications: Secondary | ICD-10-CM

## 2023-06-19 DIAGNOSIS — Z794 Long term (current) use of insulin: Secondary | ICD-10-CM | POA: Diagnosis not present

## 2023-06-19 MED ORDER — DEXCOM G7 SENSOR MISC: 11 refills | Status: DC

## 2023-06-20 LAB — BASIC METABOLIC PANEL
BUN/Creatinine Ratio: 18 (ref 12–28)
BUN: 23 mg/dL (ref 8–27)
CO2: 24 mmol/L (ref 20–29)
Calcium: 9.2 mg/dL (ref 8.7–10.3)
Chloride: 102 mmol/L (ref 96–106)
Creatinine, Ser: 1.31 mg/dL — ABNORMAL HIGH (ref 0.57–1.00)
Glucose: 267 mg/dL — ABNORMAL HIGH (ref 70–99)
Potassium: 4.2 mmol/L (ref 3.5–5.2)
Sodium: 140 mmol/L (ref 134–144)
eGFR: 44 mL/min/{1.73_m2} — ABNORMAL LOW (ref >59)

## 2023-06-22 ENCOUNTER — Other Ambulatory Visit: Payer: Self-pay | Admitting: Student

## 2023-06-22 DIAGNOSIS — E1142 Type 2 diabetes mellitus with diabetic polyneuropathy: Secondary | ICD-10-CM

## 2023-06-22 DIAGNOSIS — E1140 Type 2 diabetes mellitus with diabetic neuropathy, unspecified: Secondary | ICD-10-CM

## 2023-06-24 ENCOUNTER — Telehealth: Payer: Self-pay | Admitting: Student

## 2023-06-24 ENCOUNTER — Encounter: Payer: Self-pay | Admitting: Student

## 2023-06-24 NOTE — Telephone Encounter (Signed)
Attempted to call patient x 3 with no answer.  Left voicemail to return phone call to clinic or look at [facility] message.  Sent patient message about creatinine still remaining elevated.  Recommend staying very well-hydrated until next appointment.  Recommend avoiding NSAIDs in the meantime.  At next appointment would recommend obtaining urine microalbumin/creatinine.  Discussed plan with Dr. Linwood Dibbles

## 2023-06-24 NOTE — Telephone Encounter (Signed)
-----   Message from [facility] McDiarmid sent at 06/22/2023  2:32 PM EST ----- Regarding: FYI  ----- Message ----- From: Nell Range Lab Results In Sent: 06/20/2023   8:15 AM EST To: Leighton Roach McDiarmid, MD

## 2023-07-06 ENCOUNTER — Ambulatory Visit: Payer: 59 | Admitting: Student

## 2023-07-06 ENCOUNTER — Ambulatory Visit (INDEPENDENT_AMBULATORY_CARE_PROVIDER_SITE_OTHER): Payer: 59 | Admitting: Family Medicine

## 2023-07-06 VITALS — BP 136/70 | Temp 97.5°F | Wt 226.6 lb

## 2023-07-06 DIAGNOSIS — I10 Essential (primary) hypertension: Secondary | ICD-10-CM | POA: Diagnosis not present

## 2023-07-06 DIAGNOSIS — Z794 Long term (current) use of insulin: Secondary | ICD-10-CM | POA: Diagnosis not present

## 2023-07-06 DIAGNOSIS — E119 Type 2 diabetes mellitus without complications: Secondary | ICD-10-CM | POA: Diagnosis not present

## 2023-07-06 DIAGNOSIS — Z23 Encounter for immunization: Secondary | ICD-10-CM

## 2023-07-06 MED ORDER — OZEMPIC (2 MG/DOSE) 8 MG/3ML ~~LOC~~ SOPN: 2.0000 mg | PEN_INJECTOR | SUBCUTANEOUS | 2 refills | Status: DC

## 2023-07-06 NOTE — Patient Instructions (Signed)
Good to see you today - Thank you for coming in  Things we discussed today:  1) For your diabetes, let's increase your Ozempic to 2mg  once a week. - Make sure you are taking you Lantus insulin every day! This is super important for controlling your blood sugar. - Continue taking your jardiance  2) Come back in 2 months to recheck your A1c.

## 2023-07-06 NOTE — Progress Notes (Signed)
    SUBJECTIVE:   CHIEF COMPLAINT / HPI:   [name] is a 67yo F w/ hx of T2DM, strokes, HTN, afib that p/f T2DM management.  Handicap placard - has hx of stroeks and uses cane and walker - requesting placard renewal  T2DM - Reports taking insulin about twice a week. Reports that she struggles due to the needle pricks.  - Takes ozempic weekly -Unable to tolerate metformin in the past due to diarrhea and GI upset.  OBJECTIVE:   BP 136/70   Temp (!) 97.5 F (36.4 C)   Wt 226 lb 9.6 oz (102.8 kg)   SpO2 98%   BMI 41.45 kg/m   General: Alert, pleasant woman walking w/ walker. NAD. HEENT: NCAT. MMM. CV: RRR, no murmurs. Resp: CTAB, no wheezing or crackles. Normal WOB on RA.  Abm: Soft, nontender, nondistended. BS present. Ext: Moves all ext spontaneously Skin: Warm, well perfused   ASSESSMENT/PLAN:   Assessment & Plan Diabetes mellitus type 2, insulin dependent (HCC) Chronic and uncontrolled.  Patient is not taking insulin as prescribed, reports dislike of injecting medications daily.  We discussed and emphasized the importance of insulin and her diabetes management, especially given her inability to tolerate some oral meds.  Reviewed that she has CKD, emphasized that poorly controlled diabetes can worsen this and ultimately lead to dialysis. She is tolerating her Ozempic well. -Encouraged medication adherence to lantus and humalog - Increase Ozempic to 2mg  weekly - Cont jardiance - f/u in 2 months for A1c   - Handicap placard filled.  Carla Brigham, MD [facility]

## 2023-07-06 NOTE — Assessment & Plan Note (Signed)
Chronic and uncontrolled.  Patient is not taking insulin as prescribed, reports dislike of injecting medications daily.  We discussed and emphasized the importance of insulin and her diabetes management, especially given her inability to tolerate some oral meds.  Reviewed that she has CKD, emphasized that poorly controlled diabetes can worsen this and ultimately lead to dialysis. She is tolerating her Ozempic well. -Encouraged medication adherence to lantus and humalog - Increase Ozempic to 2mg  weekly - Cont jardiance - f/u in 2 months for A1c

## 2023-07-07 ENCOUNTER — Other Ambulatory Visit: Payer: Self-pay | Admitting: Family Medicine

## 2023-07-07 DIAGNOSIS — E1142 Type 2 diabetes mellitus with diabetic polyneuropathy: Secondary | ICD-10-CM

## 2023-07-08 NOTE — Telephone Encounter (Signed)
Attempted to call patient about dosing and need for this as also on TID regular gabapentin?  If patient calls back please ask her which/what gabapentin she is taking. Refill denied.

## 2023-07-13 ENCOUNTER — Encounter: Payer: Self-pay | Admitting: Student

## 2023-07-13 NOTE — Telephone Encounter (Signed)
 Care team updated and letter sent for eye exam notes.

## 2023-07-17 ENCOUNTER — Other Ambulatory Visit: Payer: Self-pay | Admitting: Student

## 2023-07-17 DIAGNOSIS — I10 Essential (primary) hypertension: Secondary | ICD-10-CM

## 2023-07-20 ENCOUNTER — Other Ambulatory Visit: Payer: Self-pay | Admitting: Student

## 2023-07-20 DIAGNOSIS — B0229 Other postherpetic nervous system involvement: Secondary | ICD-10-CM

## 2023-07-27 ENCOUNTER — Other Ambulatory Visit: Payer: Self-pay | Admitting: Student

## 2023-07-27 DIAGNOSIS — M1712 Unilateral primary osteoarthritis, left knee: Secondary | ICD-10-CM

## 2023-08-07 ENCOUNTER — Other Ambulatory Visit: Payer: Self-pay

## 2023-08-07 MED ORDER — RIVAROXABAN 20 MG PO TABS: 20.0000 mg | ORAL_TABLET | Freq: Every day | ORAL | 3 refills | Status: DC

## 2023-09-03 ENCOUNTER — Ambulatory Visit: Payer: 59

## 2023-09-03 VITALS — Ht 62.0 in | Wt 232.0 lb

## 2023-09-03 DIAGNOSIS — Z0000 Encounter for general adult medical examination without abnormal findings: Secondary | ICD-10-CM

## 2023-09-03 NOTE — Patient Instructions (Addendum)
[name] , Thank you for taking time to come for your Medicare Wellness Visit. I appreciate your ongoing commitment to your health goals. Please review the following plan we discussed and let me know if I can assist you in the future.   Referrals/Orders/Follow-Ups/Clinician Recommendations: Yes; a note was sent to your provider to address a referral to podiatry for diabetic foot care and molecular breast imaging.  This is a list of the screening recommended for you and due dates:  Health Maintenance  Topic Date Due   Yearly kidney health urinalysis for diabetes  Never done   Pneumonia Vaccine (2 of 2 - PCV) 05/26/2012   Zoster (Shingles) Vaccine (2 of 2) 03/11/2021   Eye exam for diabetics  04/02/2021   Complete foot exam   01/10/2022   Mammogram  08/17/2023   Hemoglobin A1C  12/08/2023   Yearly kidney function blood test for diabetes  06/18/2024   Medicare Annual Wellness Visit  09/02/2024   Colon Cancer Screening  01/31/2029   DTaP/Tdap/Td vaccine (3 - Td or Tdap) 01/15/2031   Flu Shot  Completed   DEXA scan (bone density measurement)  Completed   COVID-19 Vaccine  Completed   Hepatitis C Screening  Completed   HPV Vaccine  Aged Out    Advanced directives: (Declined) Advance directive discussed with you today. Even though you declined this today, please call our office should you change your mind, and we can give you the proper paperwork for you to fill out.  Next Medicare Annual Wellness Visit scheduled for next year: Yes

## 2023-09-03 NOTE — Progress Notes (Signed)
Subjective:   NETANYA YAZDANI is a 68 y.o. female who presents for Medicare Annual (Subsequent) preventive examination.  Visit Complete: Virtual I connected with  Carnell Casamento Ruble on 09/03/23 by a audio enabled telemedicine application and verified that I am speaking with the correct person using two identifiers.  Patient Location: Home  Provider Location: Office/Clinic  I discussed the limitations of evaluation and management by telemedicine. The patient expressed understanding and agreed to proceed.  Vital Signs: Because this visit was a virtual/telehealth visit, some criteria may be missing or patient reported. Any vitals not documented were not able to be obtained and vitals that have been documented are patient reported.  This patient declined Interactive audio and Acupuncturist. Therefore the visit was completed with audio only.  Cardiac Risk Factors include: advanced age (>68men, >35 women);diabetes mellitus;dyslipidemia;family history of premature cardiovascular disease;hypertension;obesity (BMI >30kg/m2);sedentary lifestyle     Objective:    Today's Vitals   09/03/23 1421  Weight: 232 lb (105.2 kg)  Height: 5\' 2"  (1.575 m)  PainSc: 8   PainLoc: Back   Body mass index is 42.43 kg/m.     09/03/2023    2:24 PM 02/25/2023    2:50 PM 02/11/2023   11:31 AM 01/13/2023    1:14 PM 12/09/2022    9:51 AM 11/06/2022    3:15 PM 10/16/2022    2:11 PM  Advanced Directives  Does Patient Have a Medical Advance Directive? No No No No No No No  Would patient like information on creating a medical advance directive? No - Patient declined No - Patient declined No - Patient declined No - Patient declined  No - Patient declined No - Patient declined    Current Medications (verified) Outpatient Encounter Medications as of 09/03/2023  Medication Sig   acetaminophen (TYLENOL) 500 MG tablet Take 1,000 mg by mouth every 6 (six) hours as needed for moderate pain. (Patient not taking:  Reported on 09/15/2022)   atorvastatin (LIPITOR) 80 MG tablet Take 1 tablet (80 mg total) by mouth daily. (Cholesterol)   Blood Glucose Monitoring Suppl (ONETOUCH VERIO FLEX SYSTEM) w/Device KIT Check blood sugar 3 times per day.   Continuous Blood Gluc Receiver (DEXCOM G7 RECEIVER) DEVI Use as directed   Continuous Glucose Sensor (DEXCOM G7 SENSOR) MISC Apply new sensor Q 10 days   diclofenac Sodium (VOLTAREN) 1 % GEL APPLY FOUR grams topically FOUR TIMES DAILY AS NEEDED   empagliflozin (JARDIANCE) 25 MG TABS tablet Take 1 tablet (25 mg total) by mouth daily.   gabapentin (NEURONTIN) 300 MG capsule TAKE THREE CAPSULES BY MOUTH THREE TIMES DAILY   Gabapentin, Once-Daily, 900 MG TABS Take 1 tablet by mouth in the morning, at noon, and at bedtime.   glucose blood (ONETOUCH VERIO) test strip use to test three times daily   GNP ULTICARE PEN NEEDLES 31G X 8 MM MISC 1 applicator by Does not apply route 4 (four) times daily.   insulin glargine (LANTUS SOLOSTAR) 100 UNIT/ML Solostar Pen Inject 25 Units into the skin daily.   insulin lispro (HUMALOG) 100 UNIT/ML KwikPen Inject 25 Units into the skin daily.   lidocaine (LIDODERM) 5 % APPLY one PATCH onto SKIN EVERY DAY **Remove AND discard WITHIN 12 HOURS   Olmesartan-amLODIPine-HCTZ 40-10-25 MG TABS Take 1 tablet by mouth daily.   OneTouch Delica Lancets 33G MISC Check blood sugar 3 times daily   polyethylene glycol powder (GLYCOLAX/MIRALAX) 17 GM/SCOOP powder Take 17 g by mouth daily.   rivaroxaban (XARELTO) 20  MG TABS tablet Take 1 tablet (20 mg total) by mouth daily with supper.   Semaglutide, 2 MG/DOSE, (OZEMPIC, 2 MG/DOSE,) 8 MG/3ML SOPN Inject 2 mg into the skin once a week.   VASCEPA 0.5 g CAPS TAKE ONE CAPSULE BY MOUTH TWICE DAILY   No facility-administered encounter medications on file as of 09/03/2023.    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 Nelle Don.    Hypercholesteremia    Hypertension    Hypertension    Hypertensive urgency 05/2006   Stroke (cerebrum) (HCC)    TIA (transient ischemic attack) 2017   Past Surgical History:  Procedure Laterality Date   ARTERY BIOPSY Right 09/18/2017   Procedure: BIOPSY OF RIGHT TEMPORAL ARTERY;  Surgeon: Abigail Miyamoto, MD;  Location: [facility] OR;  Service: General;  Laterality: Right;   COLON SURGERY     no per pt   DILATION AND CURETTAGE OF UTERUS  08/04/1974   MENISCUS REPAIR  10/03/2007   right knee   TONSILECTOMY, ADENOIDECTOMY, BILATERAL MYRINGOTOMY AND TUBES  age 59   TONSILLECTOMY     Family History  Problem Relation Age of Onset   Heart failure Mother    Diabetes Father    Hypertension Father    Cancer Father    Lung cancer Sister    Colon polyps Sister    Hypertension Sister    Hypertension Sister    Hypertension Sister    Hypertension Sister    Hypertension Sister    Hypertension Sister    Stroke Brother    Diabetes Brother    Stroke Maternal Grandmother    Heart disease Maternal Grandfather    Stomach cancer Neg Hx    Esophageal cancer Neg Hx    Colon cancer Neg Hx    Social History   Socioeconomic History   Marital status: Divorced    Spouse name: Not on file   Number of children: 3   Years of education: 12   Highest education level: High school graduate  Occupational History   Occupation: Retired    Comment: Med Tech   Tobacco Use   Smoking status: Never    Passive exposure: Never   Smokeless tobacco: Never  Vaping Use   Vaping status: Never Used  Substance and Sexual Activity   Alcohol use: No   Drug  use: No   Sexual activity: Not Currently  Other Topics Concern   Not on file  Social History Narrative   Patient lives in [place] with her daughter and grandchildren.   Patient does not drive, daughter takes her to apts and shopping.    Patient enjoys playing mind games on her phone and watching TV.    Social Drivers of Corporate investment banker Strain: Low Risk  (09/03/2023)   Overall Financial Resource Strain (CARDIA)    Difficulty of Paying Living Expenses: Not hard at all  Food Insecurity: No Food Insecurity (09/03/2023)   Hunger Vital Sign    Worried About Running Out of Food in the Last Year: Never  true    Ran Out of Food in the Last Year: Never true  Transportation Needs: No Transportation Needs (09/03/2023)   PRAPARE - Administrator, Civil Service (Medical): No    Lack of Transportation (Non-Medical): No  Physical Activity: Inactive (09/03/2023)   Exercise Vital Sign    Days of Exercise per Week: 0 days    Minutes of Exercise per Session: 0 min  Stress: No Stress Concern Present (09/03/2023)   [employer] of Occupational Health - Occupational Stress Questionnaire    Feeling of Stress : Only a little  Social Connections: Moderately Integrated (09/03/2023)   Social Connection and Isolation Panel [NHANES]    Frequency of Communication with Friends and Family: More than three times a week    Frequency of Social Gatherings with Friends and Family: Once a week    Attends Religious Services: More than 4 times per year    Active Member of [employer] or Organizations: Yes    Attends Engineer, structural: More than 4 times per year    Marital Status: Divorced    Tobacco Counseling Counseling given: Not Answered   Clinical Intake:  Pre-visit preparation completed: Yes  Pain : 0-10 Pain Score: 8  (TYLENOL ARTHRITIS) Pain Location: Back Pain Orientation: Mid, Lower Pain Descriptors / Indicators: Aching, Discomfort Pain Onset: More than a month  ago Pain Frequency: Intermittent Effect of Pain on Daily Activities: Pain can diminish job performance, lower motivation to exercise and prevent you from completing daily tasks.  Pain produces disability and affects the quality of life.     BMI - recorded: 42.43 Nutritional Risks: None Diabetes: Yes CBG done?: No Did pt. bring in CBG monitor from home?: No  How often do you need to have someone help you when you read instructions, pamphlets, or other written materials from your doctor or pharmacy?: 1 - Never What is the last grade level you completed in school?: HSG  Interpreter Needed?: No  Information entered by :: Sundance Moise N. Chett Taniguchi, LPN.   Activities of Daily Living    09/03/2023    2:28 PM  In your present state of health, do you have any difficulty performing the following activities:  Hearing? 0  Vision? 0  Difficulty concentrating or making decisions? 0  Walking or climbing stairs? 0  Dressing or bathing? 0  Doing errands, shopping? 0  Preparing Food and eating ? N  Using the Toilet? N  In the past six months, have you accidently leaked urine? Y  Comment wears Depends  Do you have problems with loss of bowel control? N  Managing your Medications? N  Managing your Finances? N  Housekeeping or managing your Housekeeping? N    Patient Care Team: Bess Kinds, MD as PCP - General (Family Medicine) Ander Purpura, OD (Optometry) [facility] [place] as Referring Physician (Optometry)  Indicate any recent Medical Services you may have received from other than [facility] providers in the past year (date may be approximate).     Assessment:   This is a routine wellness examination for Shaaron.  Hearing/Vision screen Hearing Screening - Comments:: Patient denied any hearing difficulty.   No hearing aids.  Vision Screening - Comments:: Patient does wear corrective lenses. Annual eye exam done by: Timothy McFarland, OD.    Goals Addressed              This Visit's Progress    Client understands the importance of follow-up with providers by attending scheduled  visits        Depression Screen    09/03/2023    2:37 PM 07/06/2023    2:56 PM 06/10/2023    9:13 AM 02/11/2023   11:31 AM 01/13/2023    1:15 PM 12/09/2022    9:48 AM 11/06/2022    3:23 PM  PHQ 2/9 Scores  PHQ - 2 Score 4 4 5 4 3 5 5   PHQ- 9 Score 6 14 16 14 15 18 18     Fall Risk    09/03/2023    2:25 PM 06/10/2023    9:13 AM 02/11/2023   11:32 AM 01/13/2023    1:16 PM 12/09/2022    9:49 AM  Fall Risk   Falls in the past year? 1 0 1 1 1   Number falls in past yr: 1 0 0 0 1  Injury with Fall? 0 0 0 0 0  Comment right knee      Risk for fall due to : History of fall(s);Impaired balance/gait;Orthopedic patient      Follow up Education provided;Falls prevention discussed;Falls evaluation completed        MEDICARE RISK AT HOME: Medicare Risk at Home Any stairs in or around the home?: No If so, are there any without handrails?: No Home free of loose throw rugs in walkways, pet beds, electrical cords, etc?: Yes Adequate lighting in your home to reduce risk of falls?: Yes Life alert?: No Use of a cane, walker or w/c?: Yes Grab bars in the bathroom?: No Shower chair or bench in shower?: No Elevated toilet seat or a handicapped toilet?: No  TIMED UP AND GO:  Was the test performed?  No    Cognitive Function:    09/03/2023    2:27 PM  MMSE - Mini Mental State Exam  Not completed: Unable to complete        09/03/2023    2:27 PM 06/02/2019    4:25 PM  6CIT Screen  What Year? 0 points 0 points  What month? 0 points 0 points  What time? 0 points 0 points  Count back from 20 0 points 0 points  Months in reverse 0 points 2 points  Repeat phrase 0 points 0 points  Total Score 0 points 2 points    Immunizations Immunization History  Administered Date(s) Administered   Fluad Trivalent(High Dose 65+) 06/10/2023   Influenza Split 05/04/2012   Influenza Whole  07/30/2010   Influenza,inj,Quad PF,6+ Mos 09/06/2013, 06/11/2015, 04/21/2016, 09/22/2017, 06/21/2018, 04/25/2019   Influenza-Unspecified 04/25/2019, 04/24/2022   PFIZER(Purple Top)SARS-COV-2 Vaccination 11/13/2019, 12/15/2019, 06/19/2020   Pfizer Covid-19 Vaccine Bivalent Booster 73yrs & up 06/24/2021   Pfizer(Comirnaty)Fall Seasonal Vaccine 12 years and older 07/06/2023   Pneumococcal Polysaccharide-23 05/27/2011   Td 11/05/2009   Tdap 01/14/2021   Zoster Recombinant(Shingrix) 01/14/2021    TDAP status: Up to date  Flu Vaccine status: Up to date  Pneumococcal vaccine status: Due, Education has been provided regarding the importance of this vaccine. Advised may receive this vaccine at local pharmacy or Health Dept. Aware to provide a copy of the vaccination record if obtained from local pharmacy or Health Dept. Verbalized acceptance and understanding.  Covid-19 vaccine status: Completed vaccines  Qualifies for Shingles Vaccine? Yes   Zostavax completed No   Shingrix Completed?: No.    Education has been provided regarding the importance of this vaccine. Patient has been advised to call insurance company to determine out of pocket expense if they have not yet received this vaccine. Advised may also  receive vaccine at local pharmacy or Health Dept. Verbalized acceptance and understanding.  Screening Tests Health Maintenance  Topic Date Due   Diabetic kidney evaluation - Urine ACR  Never done   Pneumonia Vaccine 51+ Years old (2 of 2 - PCV) 05/26/2012   Zoster Vaccines- Shingrix (2 of 2) 03/11/2021   OPHTHALMOLOGY EXAM  04/02/2021   FOOT EXAM  01/10/2022   MAMMOGRAM  08/17/2023   HEMOGLOBIN A1C  12/08/2023   Diabetic kidney evaluation - eGFR measurement  06/18/2024   Medicare Annual Wellness (AWV)  09/02/2024   Colonoscopy  01/31/2029   DTaP/Tdap/Td (3 - Td or Tdap) 01/15/2031   INFLUENZA VACCINE  Completed   DEXA SCAN  Completed   COVID-19 Vaccine  Completed   Hepatitis C  Screening  Completed   HPV VACCINES  Aged Out    Health Maintenance  Health Maintenance Due  Topic Date Due   Diabetic kidney evaluation - Urine ACR  Never done   Pneumonia Vaccine 68+ Years old (2 of 2 - PCV) 05/26/2012   Zoster Vaccines- Shingrix (2 of 2) 03/11/2021   OPHTHALMOLOGY EXAM  04/02/2021   FOOT EXAM  01/10/2022   MAMMOGRAM  08/17/2023    Colorectal cancer screening: Type of screening: Colonoscopy. Completed 01/31/2022. Repeat every 7 years  Mammogram status: Completed 08/16/2021. Repeat every year-patient is overdue  Bone Density status: Completed 08/24/2020. Results reflect: Bone density results: NORMAL. Repeat every 5 years.  Lung Cancer Screening: (Low Dose CT Chest recommended if Age 69-80 years, 20 pack-year currently smoking OR have quit w/in 15years.) does not qualify.   Lung Cancer Screening Referral: no  Additional Screening:  Hepatitis C Screening: does qualify; Completed 07/11/2019  Vision Screening: Recommended annual ophthalmology exams for early detection of glaucoma and other disorders of the eye. Is the patient up to date with their annual eye exam?  Yes  Who is the provider or what is the name of the office in which the patient attends annual eye exams? [facility] If pt is not established with a provider, would they like to be referred to a provider to establish care? No .   Dental Screening: Recommended annual dental exams for proper oral hygiene  Diabetic Foot Exam: Diabetic Foot Exam: Completed 01/10/2021-patient is overdue  Community Resource Referral / Chronic Care Management: CRR required this visit?  No   CCM required this visit?  No     Plan:     I have personally reviewed and noted the following in the patient's chart:   Medical and social history Use of alcohol, tobacco or illicit drugs  Current medications and supplements including opioid prescriptions. Patient is not currently taking opioid  prescriptions. Functional ability and status Nutritional status Physical activity Advanced directives List of other physicians Hospitalizations, surgeries, and ER visits in previous 12 months Vitals Screenings to include cognitive, depression, and falls Referrals and appointments  In addition, I have reviewed and discussed with patient certain preventive protocols, quality metrics, and best practice recommendations. A written personalized care plan for preventive services as well as general preventive health recommendations were provided to patient.     Mickeal Needy, LPN   4/78/2956   After Visit Summary: (MyChart) Due to this being a telephonic visit, the after visit summary with patients personalized plan was offered to patient via MyChart   Nurse Notes: During AWV patient voiced concerns with the following: Patient would like to be referred for molecular breast imaging due the size of breast (too painful  for regular mammogram screening), requesting a referral to Podiatry for Diabetic Foot Exam due to trimming her toenails too short and its painful.  Also patient is due for the following vaccines: Pneumonia and Shingrix.

## 2023-09-07 ENCOUNTER — Other Ambulatory Visit: Payer: Self-pay | Admitting: Family Medicine

## 2023-09-07 DIAGNOSIS — E119 Type 2 diabetes mellitus without complications: Secondary | ICD-10-CM

## 2023-09-14 ENCOUNTER — Ambulatory Visit: Payer: 59 | Admitting: Student

## 2023-09-14 ENCOUNTER — Ambulatory Visit (INDEPENDENT_AMBULATORY_CARE_PROVIDER_SITE_OTHER): Payer: 59 | Admitting: Student

## 2023-09-14 VITALS — BP 123/90 | HR 84 | Temp 98.2°F | Wt 222.0 lb

## 2023-09-14 DIAGNOSIS — Z794 Long term (current) use of insulin: Secondary | ICD-10-CM | POA: Diagnosis not present

## 2023-09-14 DIAGNOSIS — E119 Type 2 diabetes mellitus without complications: Secondary | ICD-10-CM

## 2023-09-14 DIAGNOSIS — M1712 Unilateral primary osteoarthritis, left knee: Secondary | ICD-10-CM

## 2023-09-14 DIAGNOSIS — G8929 Other chronic pain: Secondary | ICD-10-CM | POA: Diagnosis not present

## 2023-09-14 DIAGNOSIS — M545 Low back pain, unspecified: Secondary | ICD-10-CM | POA: Diagnosis not present

## 2023-09-14 LAB — POCT GLYCOSYLATED HEMOGLOBIN (HGB A1C): HbA1c, POC (controlled diabetic range): 7.4 % — AB (ref 0.0–7.0)

## 2023-09-14 NOTE — Assessment & Plan Note (Addendum)
 Patient comes in for follow-up of back pain.  Patient appreciates back has been flared/hurting this morning, as she had to wait outside for the clinic to open for longer than 10 minutes.  Patient usually able to be on her feet for 10 minutes but after that, back pain will set in.  Patient has history of OA in her lower back.  Patient with good range of motion, tender to palpation on bony structures.  Patient appreciates normal bathroom habits.  Suspect this is flare of her OA, will recommend patient use Voltaren  gel and lidocaine  patch.  Will also obtain x-ray of back as patient reports she has been following about once a month.  Will also send to orthopedist for evaluation of back and knee. - Voltaren  gel/lidocaine  patch as needed - Lumbar spine x-ray - Follow-up with [facility]

## 2023-09-14 NOTE — Progress Notes (Signed)
 SUBJECTIVE:   CHIEF COMPLAINT / HPI:   DM Meds: Lantus  25 units daily, Humalog  25 units, Ozempic  2 mg weekly,  Jardiance  25 mg daily   Taking Lantus  33 units BID, 30 units humalog , Ozempic  2 mg weekly, jardiance  25 daily Reports CBG rang 100-130 in am, Never above 200's   HLD Atovostatin 80 mg Taking Daily  HTN Olmesartan -amlodipine -HCTZ  Taking daily  Back Pain Back is hurting today, because she waited outside for 45 min, standing, waiting for clinic to open. Can usually only stand for 10 minutes or so. Back pain in lower back, located in the middle. Rated 10/10. Appreciates she falls 1 x a month, but does not injure herself or hit head. Falls on her bottom. Right knee gives out causing her to fall, if she stands too quickly.   PERTINENT  PMH / PSH:    OBJECTIVE:  Pulse (!) 105   Temp 98.2 F (36.8 C)   Wt 222 lb (100.7 kg)   SpO2 100%   BMI 40.60 kg/m  Physical Exam Constitutional:      Appearance: Normal appearance. She is not ill-appearing.  Cardiovascular:     Rate and Rhythm: Normal rate and regular rhythm.     Pulses: Normal pulses.     Heart sounds: Normal heart sounds. No murmur heard.    No friction rub. No gallop.  Pulmonary:     Effort: Pulmonary effort is normal. No respiratory distress.     Breath sounds: Normal breath sounds. No stridor. No wheezing, rhonchi or rales.  Musculoskeletal:     Lumbar back: Bony tenderness present. No swelling, edema, deformity, signs of trauma, lacerations, spasms or tenderness. Normal range of motion. No scoliosis.  Skin:    Capillary Refill: Capillary refill takes less than 2 seconds.  Neurological:     Mental Status: She is alert.      ASSESSMENT/PLAN:   Assessment & Plan Diabetes mellitus type 2, insulin  dependent (HCC) Patient comes in for follow-up of her diabetes.  Patient reports good compliance with medications although she does have periods of hypoglycemia.  Patient has been taking Lantus  twice daily, but  was not told to do this.  Will recommend patient take Lantus  daily only, and use Humalog  on largest meal.  Patient reports CBGs ranging 100-1 30s when fasting, and never above 200s during the day.  Patient A1c improved today 7.3, from 9.  Will have patient continue prescribed regimen and follow-up in 1 month. - Lantus  33 units daily - Humalog  25 units daily - Ozempic  2 mg weekly - Jardiance  25 mg daily Chronic midline low back pain, unspecified whether sciatica present Patient comes in for follow-up of back pain.  Patient appreciates back has been flared/hurting this morning, as she had to wait outside for the clinic to open for longer than 10 minutes.  Patient usually able to be on her feet for 10 minutes but after that, back pain will set in.  Patient has history of OA in her lower back.  Patient with good range of motion, tender to palpation on bony structures.  Patient appreciates normal bathroom habits.  Suspect this is flare of her OA, will recommend patient use Voltaren  gel and lidocaine  patch.  Will also obtain x-ray of back as patient reports she has been following about once a month.  Will also send to orthopedist for evaluation of back and knee. - Voltaren  gel/lidocaine  patch as needed - Lumbar spine x-ray - Follow-up with [facility] Primary osteoarthritis  of left knee Patient appreciates her left knee gives out on her if she ever tries to get up quickly.  This has been a continued issue.  Pain well-controlled with Voltaren  gel/lidocaine  patches.  Will recommend patient be evaluated by orthopedist for consideration of knee replacement.  Patient also appreciates knee injections do not help. - Follow-up with [facility] - Voltaren  gel and lidocaine  patch as needed No follow-ups on file. Wilhemena Harbour, MD 09/14/2023, 9:20 AM PGY-3, [facility]

## 2023-09-14 NOTE — Assessment & Plan Note (Addendum)
 Patient comes in for follow-up of her diabetes.  Patient reports good compliance with medications although she does have periods of hypoglycemia.  Patient has been taking Lantus  twice daily, but was not told to do this.  Will recommend patient take Lantus  daily only, and use Humalog  on largest meal.  Patient reports CBGs ranging 100-1 30s when fasting, and never above 200s during the day.  Patient A1c improved today 7.3, from 9.  Will have patient continue prescribed regimen and follow-up in 1 month. - Lantus  33 units daily - Humalog  25 units daily - Ozempic  2 mg weekly - Jardiance  25 mg daily

## 2023-09-14 NOTE — Patient Instructions (Signed)
 It was great to see you! Thank you for allowing me to participate in your care!  I recommend that you always bring your medications to each appointment as this makes it easy to ensure we are on the correct medications and helps us  not miss when refills are needed.  Our plans for today:  - Diabetes  Continue  Lantus  33 units daily Humalog  25 units (with largest meal) Ozempic  2 mg weekly Jardiance  25 mg daily   Follow up 1 month  - Due for Mammogram  - Back Pain / Knee Pain  Getting x-ray of back   [facility]   [address] [place] Phone: [phone] Use lidocaine  patches and Voltaren  gel as needed  Discuss pain with Orthopedist  [facility] Address: [address], [place], [place] [address] Phone: [phone]     We are checking some labs today, I will call you if they are abnormal will send you a MyChart message or a letter if they are normal.  If you do not hear about your labs in the next 2 weeks please let us  know.  Take care and seek immediate care sooner if you develop any concerns.   Dr. Wilhemena Harbour, MD [facility]

## 2023-09-14 NOTE — Assessment & Plan Note (Addendum)
 Patient appreciates her left knee gives out on her if she ever tries to get up quickly.  This has been a continued issue.  Pain well-controlled with Voltaren  gel/lidocaine  patches.  Will recommend patient be evaluated by orthopedist for consideration of knee replacement.  Patient also appreciates knee injections do not help. - Follow-up with [facility] - Voltaren  gel and lidocaine  patch as needed

## 2023-09-15 LAB — MICROALBUMIN / CREATININE URINE RATIO
Creatinine, Urine: 143.3 mg/dL
Microalb/Creat Ratio: 14 mg/g{creat} (ref 0–29)
Microalbumin, Urine: 20.5 ug/mL

## 2023-09-16 ENCOUNTER — Other Ambulatory Visit: Payer: Self-pay | Admitting: Student

## 2023-09-16 DIAGNOSIS — I10 Essential (primary) hypertension: Secondary | ICD-10-CM

## 2023-09-16 MED ORDER — OLMESARTAN-AMLODIPINE-HCTZ 40-10-25 MG PO TABS: 1.0000 | ORAL_TABLET | Freq: Every day | ORAL | 3 refills | Status: DC

## 2023-09-17 ENCOUNTER — Encounter: Payer: Self-pay | Admitting: Student

## 2023-09-29 ENCOUNTER — Other Ambulatory Visit (INDEPENDENT_AMBULATORY_CARE_PROVIDER_SITE_OTHER): Payer: 59

## 2023-09-29 ENCOUNTER — Encounter: Payer: Self-pay | Admitting: Physician Assistant

## 2023-09-29 ENCOUNTER — Ambulatory Visit (INDEPENDENT_AMBULATORY_CARE_PROVIDER_SITE_OTHER): Payer: 59 | Admitting: Physician Assistant

## 2023-09-29 DIAGNOSIS — M25562 Pain in left knee: Secondary | ICD-10-CM

## 2023-09-29 DIAGNOSIS — M25561 Pain in right knee: Secondary | ICD-10-CM | POA: Diagnosis not present

## 2023-09-29 NOTE — Progress Notes (Signed)
 Office Visit Note   Patient: [name]           Date of Birth: [birth year]           MRN: [id number] Visit Date: 09/29/2023              Requested by: Bess Kinds, MD [address] [place],  [place] [address] PCP: Bess Kinds, MD   Assessment & Plan: Visit Diagnoses: Bilateral knee pain  Plan: Pleasant 68 year old woman with a history of bilateral knee pain she said this has been going on for about a year.  She said going up and down stairs is particularly painful.  She was referred to physical therapy however she only went once because she felt she was not treated well by the front desk.  She also complains that because of her knee her legs just give way.  She cannot do a lot of steroids as her hemoglobin A1c is 7.6.  She does not really want to do ibuprofen or Tylenol either.  We discussed going back to physical therapy she is in agreement with this plan and using topical medication.  If she has back issues which also could be a cause would refer her to [facility] specifically for the back  Follow-Up Instructions: No follow-ups on file.   Orders:  No orders of the defined types were placed in this encounter.  No orders of the defined types were placed in this encounter.     Procedures: No procedures performed   Clinical Data: No additional findings.   Subjective: No chief complaint on file.   HPI patient is a 68 year old woman who presents today with bilateral left greater than right knee pain.  This been going on for about a year no evidence of any injury she feels like her knees buckle especially with stairs.  Review of Systems  All other systems reviewed and are negative.    Objective: Vital Signs: There were no vitals taken for this visit.  Physical Exam Constitutional:      Appearance: Normal appearance.  Pulmonary:     Effort: Pulmonary effort is normal.  Skin:    General: Skin is warm and dry.  Neurological:     General: No focal  deficit present.     Mental Status: She is alert and oriented to person, place, and time.  Psychiatric:        Mood and Affect: Mood normal.        Behavior: Behavior normal.     Ortho Exam Bilateral knees she does have grinding with range of motion no erythema no effusion.  She has negative straight legs bilaterally compartments are soft and nontender no radicular findings at this time Specialty Comments:  No specialty comments available.  Imaging: No results found.   PMFS History: Patient Active Problem List   Diagnosis Date Noted   Poor nutrition 02/18/2023   Physical deconditioning 12/10/2022   Primary osteoarthritis of left knee 10/16/2022   Diabetes mellitus type 2, insulin dependent (HCC) 10/02/2021   Diabetic neuropathy (HCC) 06/21/2018   Chronic midline low back pain 06/21/2018   Mild obstructive sleep apnea 11/18/2013   Constipation 05/28/2011   Atrial fibrillation (HCC) 06/07/2009   Depression 09/29/2008   Hyperlipidemia associated with type 2 diabetes mellitus (HCC) 08/22/2008   CHRONIC KIDNEY DISEASE STAGE II (MILD) 08/01/2008   Essential hypertension 04/16/2007   Past Medical History:  Diagnosis Date   Arthritis    Atrial fibrillation (HCC)  Cataract    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 Nelle Don.    Hypercholesteremia    Hypertension    Hypertension    Hypertensive urgency 05/2006   Stroke (cerebrum) (HCC)    TIA (transient ischemic attack) 2017    Family History  Problem Relation Age of Onset   Heart failure Mother    Diabetes Father    Hypertension Father    Cancer Father    Lung cancer Sister    Colon polyps Sister    Hypertension Sister    Hypertension Sister    Hypertension Sister     Hypertension Sister    Hypertension Sister    Hypertension Sister    Stroke Brother    Diabetes Brother    Stroke Maternal Grandmother    Heart disease Maternal Grandfather    Stomach cancer Neg Hx    Esophageal cancer Neg Hx    Colon cancer Neg Hx     Past Surgical History:  Procedure Laterality Date   ARTERY BIOPSY Right 09/18/2017   Procedure: BIOPSY OF RIGHT TEMPORAL ARTERY;  Surgeon: Abigail Miyamoto, MD;  Location: [facility] OR;  Service: General;  Laterality: Right;   COLON SURGERY     no per pt   DILATION AND CURETTAGE OF UTERUS  08/04/1974   MENISCUS REPAIR  10/03/2007   right knee   TONSILECTOMY, ADENOIDECTOMY, BILATERAL MYRINGOTOMY AND TUBES  age 24   TONSILLECTOMY     Social History   Occupational History   Occupation: Retired    Comment: Med Tech   Tobacco Use   Smoking status: Never    Passive exposure: Never   Smokeless tobacco: Never  Vaping Use   Vaping status: Never Used  Substance and Sexual Activity   Alcohol use: No   Drug use: No   Sexual activity: Not Currently

## 2023-10-05 ENCOUNTER — Other Ambulatory Visit: Payer: Self-pay | Admitting: Student

## 2023-10-05 DIAGNOSIS — E785 Hyperlipidemia, unspecified: Secondary | ICD-10-CM

## 2023-10-08 NOTE — Therapy (Signed)
 OUTPATIENT PHYSICAL THERAPY LOWER EXTREMITY EVALUATION   Patient Name: [name] MRN: [id number] DOB:07-20-1956, 68 y.o., female Today's Date: 10/13/2023  END OF SESSION:  PT End of Session - 10/13/23 0921     Visit Number 1    Number of Visits 13    Date for PT Re-Evaluation 11/24/23    Authorization Type UHC MCR    PT Start Time 0905    PT Stop Time 0955    PT Time Calculation (min) 50 min    Activity Tolerance Patient limited by fatigue;Patient limited by pain    Behavior During Therapy [facility] for tasks assessed/performed             Past Medical History:  Diagnosis Date   Arthritis    Atrial fibrillation (HCC)    Cataract    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 Nelle Don.    Hypercholesteremia    Hypertension    Hypertension    Hypertensive urgency 05/2006   Stroke (cerebrum) (HCC)    TIA (transient ischemic attack) 2017   Past Surgical History:  Procedure Laterality Date   ARTERY BIOPSY Right 09/18/2017   Procedure: BIOPSY OF RIGHT TEMPORAL ARTERY;  Surgeon: Abigail Miyamoto, MD;  Location: [facility] OR;  Service: General;  Laterality: Right;   COLON SURGERY     no per pt   DILATION AND CURETTAGE OF UTERUS  08/04/1974   MENISCUS REPAIR  10/03/2007   right knee   TONSILECTOMY, ADENOIDECTOMY, BILATERAL MYRINGOTOMY AND TUBES  age 36   TONSILLECTOMY     Patient Active Problem List   Diagnosis Date Noted   Poor nutrition 02/18/2023   Physical deconditioning 12/10/2022   Primary osteoarthritis of left knee 10/16/2022   Diabetes mellitus type 2, insulin dependent (HCC) 10/02/2021   Diabetic neuropathy (HCC) 06/21/2018   Chronic midline low back pain 06/21/2018   Mild obstructive sleep apnea 11/18/2013   Constipation  05/28/2011   Atrial fibrillation (HCC) 06/07/2009   Depression 09/29/2008   Hyperlipidemia associated with type 2 diabetes mellitus (HCC) 08/22/2008   CHRONIC KIDNEY DISEASE STAGE II (MILD) 08/01/2008   Essential hypertension 04/16/2007    PCP: Bess Kinds, MD   REFERRING PROVIDER: Persons, [place], [place]   REFERRING DIAG: [phone] (ICD-10-CM) - Pain in both knees, unspecified chronicity   THERAPY DIAG:  Chronic pain of both knees - Plan: PT plan of care cert/re-cert  Muscle weakness (generalized) - Plan: PT plan of care cert/re-cert  Difficulty in walking, not elsewhere classified - Plan: PT plan of care cert/re-cert  Midline low back pain with left-sided sciatica, unspecified chronicity - Plan: PT plan of care cert/re-cert  Rationale for Evaluation and Treatment: Rehabilitation  ONSET DATE: Worsening over the past year   I have had knee pain for over 15 years  SUBJECTIVE:   SUBJECTIVE STATEMENT: My doctor recommended PT for my arthritis and my weakness. I have been falling about 2 x a month because of my weakness. I have a hard time going up stairs.  My knees are very painful. I can only stand and walk about 5 to 10 minutes. I can't get in bath tub  I dont sleep well only about 2 hours at a time. 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 Yes: NPRS scale: Right  knee 7/10 and at worst 8/10  Left knee 4/10 and at worst 9/10 Pain location: bil knees Pain description: achy sharp like somebody picking and stabbing Aggravating factors: standing more than 5 -10 min, getting up quickly to care for grandkids, stairs I can't take a bath tub  I sit on edge and do a sponge bath,  Relieving factors: salonpas patches and voltaren cream Back pain- at worst 9/10 and at rest 0/10  achy in mid back PRECAUTIONS:  None  RED FLAGS: None   WEIGHT BEARING RESTRICTIONS: No  FALLS:  Has patient fallen in last 6 months? Yes. Number of falls 2 x a month if I get up quickly my knees feel like they " give way"   LIVING ENVIRONMENT: Lives with: lives with their family with dtr Lives in: House/apartment Stairs: Yes: External: 3 steps; can reach both Has following equipment at home: Single point cane and Walker - 4 wheeled  OCCUPATION: retired former Chief Strategy Officer and med tech  PLOF: Independent with household mobility with device and mostly sedentary  PATIENT GOALS: be able to walk with out stumbling and my knees giving out,   NEXT MD VISIT: TBD  OBJECTIVE:  Note: Objective measures were completed at Evaluation unless otherwise noted.  DIAGNOSTIC FINDINGS: see medical record  PATIENT SURVEYS:  LEFS 12/80  15%  COGNITION: Overall cognitive status: Within functional limits for tasks assessed     SENSATION: WFL Diabetic neuropathy  EDEMA:  NT  MUSCLE LENGTH: Hamstrings: Right bil tightness 54   POSTURE: rounded shoulders, forward head, flexed trunk , and obesity  PALPATION: Global tenderness around knee joint, arthritic pain   LUMBAR ROM:   AROM eval  Flexion Fingertips to knees bil  Extension 0  Right lateral flexion Limited 75%  Left lateral flexion Limted 75%  Right rotation Limited 75%  Left rotation Limited 75%   (Blank rows = not tested)   LOWER EXTREMITY ROM:  Active ROM Right eval Left eval  Hip flexion    Hip extension    Hip abduction    Hip adduction    Hip internal rotation    Hip external rotation    Knee flexion 116/ P 125 120/P127  Knee extension -7 -5  Ankle dorsiflexion 5 5  Ankle plantarflexion    Ankle inversion    Ankle eversion     (Blank rows = not tested)  LOWER EXTREMITY MMT:  MMT Right eval Left eval  Hip flexion 4- 4-  Hip extension 3- 3-  Hip abduction 3- 4-  Hip adduction    Hip internal rotation    Hip external  rotation    Knee flexion 4- 4  Knee extension 4- 4  Ankle dorsiflexion    Ankle plantarflexion 4/25 3/25  Ankle inversion    Ankle eversion     (Blank rows = not tested)  LOWER EXTREMITY SPECIAL TESTS:  NT  FUNCTIONAL TESTS:  5 times sit to stand: 53.32 sec 2 minute walk test: 131.8  Norm for age 6438ft  GAIT: Distance walked: 131.8 ft Assistive device utilized: Single point cane Level of assistance: Modified independence Comments: Pt with antalgic gait and shortened stride length  TREATMENT DATE: eval and issue HEP    PATIENT EDUCATION:  Education details: POC, explanation of findings, issue HEP Person educated: Patient Education method: Explanation, Demonstration, Tactile cues, Verbal cues, and Handouts Education comprehension: verbalized understanding, returned demonstration, verbal cues required, tactile cues required, and needs further education  HOME EXERCISE PROGRAM: Access Code: [email or web address] URL: [email or web address] Date: 10/13/2023 Prepared by: Garen Lah  Exercises - Supine Lower Trunk Rotation  - 1 x daily - 7 x weekly - 1 sets - 5 reps - 20 sec  hold - Supine Active Straight Leg Raise  - 1 x daily - 7 x weekly - 3 sets - 10 reps - Seated Heel Toe Raises  - 1 x daily - 7 x weekly - 3 sets - 10 reps - Sit to stand with sink support Movement snack  - 1 x daily - 7 x weekly - 3 sets - 5-10 reps - Seated Long Arc Quad  - 1 x daily - 7 x weekly - 3 sets - 10 reps  ASSESSMENT:  CLINICAL IMPRESSION: Patient is a 68 y.o. female who was seen today for physical therapy evaluation and treatment for bil chronic knee pain. Pt also reports falling 2 x a month because of unsteadiness and weakness in knees.  Pt also complains of midline back pain that sometimes radiates into buttocks. Pt often cares for grandkids and feels ill  equipped to respond in a quick manner to children's needs due to weakness.  Pt will benefit from skilled PT to address deconditioning, weakness and falls prevention.  OBJECTIVE IMPAIRMENTS: decreased activity tolerance, decreased balance, decreased knowledge of condition, decreased knowledge of use of DME, decreased mobility, difficulty walking, obesity, and pain.   ACTIVITY LIMITATIONS: standing, squatting, sleeping, stairs, bathing, hygiene/grooming, locomotion level, and caring for others  PARTICIPATION LIMITATIONS: meal prep, cleaning, laundry, shopping, community activity, and church  PERSONAL FACTORS:  See pertinent medical history and sedentary lifestyle  are also affecting patient's functional outcome.   REHAB POTENTIAL: Fair sedentary lifestyle and multiple falls  CLINICAL DECISION MAKING: Evolving/moderate complexity  EVALUATION COMPLEXITY: Moderate   GOALS: Goals reviewed with patient? Yes  SHORT TERM GOALS: Target date: 11-12-23 Pt will be independent with initial HEP Baseline:no knowledge Goal status: INITIAL  2.  Pt will begin to walk 10 minutes without stopping to show increased mobility Baseline: Pt unable to walk for 5 min without bil knee pain Goal status: INITIAL  3.  Demonstrate understanding of neutral posture and be more conscious of position and posture throughout the day.  Baseline: limited knowledge Goal status: INITIAL  4. Pt will be educated on fall preparedness and verbalize at least 3 safety techniques to remain living in home independently to reduce falls  Baseline  No knowledge   LONG TERM GOALS: Target date: 12-08-23  Pt will be independent with advanced HEP.  Baseline: no knowledge Goal status: INITIAL  2.  Pt will be educated on sleeping aids and posture helps to receive 4 or more hours of sleep uninterrupted Baseline: Pt wakes every2 hours because of pain at night Goal status: INITIAL  3.  Pt will be able to report at least 50% decrease  in pain with mobilty Baseline: 8-9/10 pain level Goal status: INITIAL  4.  Pt will be able to demonstrate / simulate safe tub transfer in order to take a bath independently Baseline: unable to take a shower or a bath, and sits on side of tub for bathing Goal status: INITIAL  5.  Pt will  be able to stand for 25 minutes in order to complete household chore/self care tasks Baseline: eval less than 5 minutes Goal status: INITIAL  6.  Patient will report improved functional level on LEFS >/= 50/80 in order to show improved mobility level Baseline: 12/80 15 % Goal status: INITIAL   PLAN:  PT FREQUENCY: 1-2x/week  PT DURATION: 6 weeks  PLANNED INTERVENTIONS: 97164- PT Re-evaluation, 97110-Therapeutic exercises, 97530- Therapeutic activity, 97112- Neuromuscular re-education, 97535- Self Care, 16109- Manual therapy, [phone]- Gait training, [phone]- Aquatic Therapy, [phone]- Electrical stimulation (manual), Patient/Family education, Balance training, Stair training, Taping, Dry Needling, Joint mobilization, Spinal mobilization, Cryotherapy, and Moist heat  PLAN FOR NEXT SESSION: BERG balance test, HEP and sit to stands  Garen Lah, PT, [facility] Certified Exercise Expert for the Aging Adult  10/13/23 2:57 PM Phone: [phone] Fax: [phone]   Date of referral: 09-29-23 Referring provider: Persons, [place], [place]  Referring diagnosis? M25.561,M25.562 (ICD-10-CM) - Pain in both knees, unspecified chronicity  Treatment diagnosis? (if different than referring diagnosis) difficulty walking, chronic midline back pain without sciatica, muscle weakness   What was this (referring dx) caused by? Fall, Arthritis, and Other: generalized weakness  Ashby Dawes of Condition: Chronic (continuous duration > 3 months)   Laterality: Both  Current Functional Measure Score: LEFS 12/80  15 %  Objective measurements identify impairments when they are compared to normal values, the uninvolved extremity, and  prior level of function.  [x]  Yes  []  No  Objective assessment of functional ability: Severe functional limitations   Briefly describe symptoms: bil knee pain, decreased mobility, back pain  How did symptoms start: OA, chronic condition  Average pain intensity:  Last 24 hours: 8-9/10  Past week: 8-9/10  How often does the pt experience symptoms? Constantly  How much have the symptoms interfered with usual daily activities? Quite a bit  How has condition changed since care began at this facility? NA - initial visit  In general, how is the patients overall health? Fair  DM neuropathy   BACK PAIN (STarT Back Screening Tool) Has pain spread down the leg(s) at some time in the last 2 weeks? Yes to buttocks Has there been pain in the shoulder or neck at some time in the last 2 weeks? no Has the pt only walked short distances because of back pain? No  knee pain limits Has patient dressed more slowly because of back pain in the past 2 weeks? yes Does patient think it's not safe for a person with this condition to be physically active? No but she is hesitant to move because of weakness and pain in knees Does patient have worrying thoughts a lot of the time? yes Does patient feel back pain is terrible and will never get any better? yes Has patient stopped enjoying things they usually enjoy? yes

## 2023-10-12 ENCOUNTER — Ambulatory Visit (INDEPENDENT_AMBULATORY_CARE_PROVIDER_SITE_OTHER): Payer: 59 | Admitting: Student

## 2023-10-12 ENCOUNTER — Encounter: Payer: Self-pay | Admitting: Student

## 2023-10-12 VITALS — BP 130/80 | HR 92 | Ht 62.0 in | Wt 217.8 lb

## 2023-10-12 DIAGNOSIS — E119 Type 2 diabetes mellitus without complications: Secondary | ICD-10-CM | POA: Diagnosis not present

## 2023-10-12 DIAGNOSIS — Z794 Long term (current) use of insulin: Secondary | ICD-10-CM | POA: Diagnosis not present

## 2023-10-12 DIAGNOSIS — M1712 Unilateral primary osteoarthritis, left knee: Secondary | ICD-10-CM | POA: Diagnosis not present

## 2023-10-12 NOTE — Patient Instructions (Signed)
 It was great to see you! Thank you for allowing me to participate in your care!  I recommend that you always bring your medications to each appointment as this makes it easy to ensure we are on the correct medications and helps [place] not miss when refills are needed.  Our plans for today:  - Diabetes Continue current regimen Lantus 25 units daily Humalog 25 units daily (with largest meal if you eat good) Ozempic 2 mg weekly (may consider decreasing in the future) Jardiance 25 mg daily Follow up 1 month Eat one good meal a day (protein, vegetables, carbs)   - Knee and back pain Message your orthopedist or call them and ask about the referral for your back. They mentioned a possible referral to [facility]  Take care and seek immediate care sooner if you develop any concerns.   Dr. Bess Kinds, MD [facility]

## 2023-10-12 NOTE — Assessment & Plan Note (Addendum)
 Patient comes in for follow-up for diabetes.  Patient reports she has been taking 25 units of Lantus, as she was originally taken 30 but was having episodes of hypoglycemia, with CBGs down into the 40s.  Patient appreciates no episodes since decreasing her long-acting dose to 25.  Patient usually not taking Humalog, as she does not eat meals regularly, or while eating very much when she does eat.  Patient still taking Ozempic, and Jardiance.  Given risk for hypoglycemia will support decreased dose of insulin, with close follow-up.  Patient did not bring in CGM today to be read, but will for next visit.  Also encouraging good balanced meal at least once a day (with protein, vegetables, carbs).  May consider decreasing Ozempic in the future, to help support appetite. - Lantus 25 units daily - Humalog 25 units with largest meal, if she eats a good meal - Ozempic 2 mg weekly - Jardiance 25 mg daily - follow-up 1 month

## 2023-10-12 NOTE — Assessment & Plan Note (Addendum)
 Patient saw orthopedist for bilateral knee arthritis, and is due to start working with physical therapy this week.  Will recommend patient message orthopedist about referral for back. - Follow-up with orthopedist - Continue PT

## 2023-10-12 NOTE — Progress Notes (Signed)
  SUBJECTIVE:   CHIEF COMPLAINT / HPI:   DM2 Meds: Lantus 33 units daily, Humalog 25 units daily, Ozempic 2 mg weekly, Jardiance 25 mg daily  Today Taking 25 units of latuns, and not doing the humalog as she doesn't eat much. Notes fasting sugars in the 80-110 range. Has had one episode of CBG in the 40's and decreased the dose of her long acting. Also only eat's one meal a day, likely due to ozempic and poor diet decisions (notes decreased appetite and in too much pain to cook food)    Knee Pain / Back Pain  Has seen orthopedist and they are recommending PT to help with knee strength and mobility. They are supposed to refer her to someone for her back.  PERTINENT  PMH / PSH:   OBJECTIVE:  There were no vitals taken for this visit. Physical Exam Constitutional:      General: She is not in acute distress.    Appearance: Normal appearance. She is not ill-appearing.  Cardiovascular:     Rate and Rhythm: Normal rate and regular rhythm.     Pulses: Normal pulses.     Heart sounds: Normal heart sounds. No murmur heard.    No friction rub. No gallop.  Pulmonary:     Effort: Pulmonary effort is normal. No respiratory distress.     Breath sounds: Normal breath sounds. No stridor. No wheezing, rhonchi or rales.  Abdominal:     General: Abdomen is flat. There is no distension.     Palpations: Abdomen is soft. There is no mass.     Tenderness: There is no abdominal tenderness. There is no guarding or rebound.     Hernia: No hernia is present.  Neurological:     Mental Status: She is alert.  Psychiatric:        Mood and Affect: Mood normal.      ASSESSMENT/PLAN:   Assessment & Plan Diabetes mellitus type 2, insulin dependent (HCC) Patient comes in for follow-up for diabetes.  Patient reports she has been taking 25 units of Lantus, as she was originally taken 30 but was having episodes of hypoglycemia, with CBGs down into the 40s.  Patient appreciates no episodes since decreasing  her long-acting dose to 25.  Patient usually not taking Humalog, as she does not eat meals regularly, or while eating very much when she does eat.  Patient still taking Ozempic, and Jardiance.  Given risk for hypoglycemia will support decreased dose of insulin, with close follow-up.  Patient did not bring in CGM today to be read, but will for next visit.  Also encouraging good balanced meal at least once a day (with protein, vegetables, carbs).  May consider decreasing Ozempic in the future, to help support appetite. - Lantus 25 units daily - Humalog 25 units with largest meal, if she eats a good meal - Ozempic 2 mg weekly - Jardiance 25 mg daily - follow-up 1 month Primary osteoarthritis of left knee Patient saw orthopedist for bilateral knee arthritis, and is due to start working with physical therapy this week.  Will recommend patient message orthopedist about referral for back. - Follow-up with orthopedist - Continue PT No follow-ups on file. Bess Kinds, MD 10/12/2023, 7:28 AM PGY-3, [facility]

## 2023-10-13 ENCOUNTER — Ambulatory Visit: Payer: 59 | Attending: Physician Assistant | Admitting: Physical Therapy

## 2023-10-13 ENCOUNTER — Encounter: Payer: Self-pay | Admitting: Physical Therapy

## 2023-10-13 ENCOUNTER — Other Ambulatory Visit: Payer: Self-pay

## 2023-10-13 DIAGNOSIS — M25561 Pain in right knee: Secondary | ICD-10-CM | POA: Diagnosis not present

## 2023-10-13 DIAGNOSIS — R2689 Other abnormalities of gait and mobility: Secondary | ICD-10-CM | POA: Diagnosis not present

## 2023-10-13 DIAGNOSIS — M6281 Muscle weakness (generalized): Secondary | ICD-10-CM | POA: Diagnosis not present

## 2023-10-13 DIAGNOSIS — M25562 Pain in left knee: Secondary | ICD-10-CM | POA: Insufficient documentation

## 2023-10-13 DIAGNOSIS — R262 Difficulty in walking, not elsewhere classified: Secondary | ICD-10-CM | POA: Diagnosis not present

## 2023-10-13 DIAGNOSIS — G8929 Other chronic pain: Secondary | ICD-10-CM | POA: Insufficient documentation

## 2023-10-13 DIAGNOSIS — M5442 Lumbago with sciatica, left side: Secondary | ICD-10-CM | POA: Insufficient documentation

## 2023-10-13 NOTE — Patient Instructions (Signed)
 Aquatic Therapy at [facility]-  What to Expect!  Where:   [facility] [address] [place], [place] [address] Rehab phone [phone]  MS Godinho NEES TO HAVE SOMEONE WITH HER TO HELP HER GET DRESSED AT THE POOL NOTE:  You will receive an automated phone message reminding you of your appt and it will say the appointment is at the [address] [facility] clinic.          How to Prepare: Please make sure you drink 8 ounces of water about one hour prior to your pool session A caregiver may attend if needed with the patient to help assist as needed. A caregiver can sit in the pool room on chair. Please arrive IN YOUR SUIT and 15 minutes prior to your appointment - this helps to avoid delays in starting your session. Please make sure to attend to any toileting needs prior to entering the pool [place] rooms for changing are provided.   There is direct access to the pool deck form the locker room.  You can lock your belongings in a locker with lock provided. Once on the pool deck your therapist will ask if you have signed the Patient  Consent and Assignment of Benefits form before beginning treatment Your therapist may take your blood pressure prior to, during and after your session if indicated We usually try and create a home exercise program based on activities we do in the pool.  Please be thinking about who might be able to assist you in the pool should you need to participate in an aquatic home exercise program at the time of discharge if you need assistance.  Some patients do not want to or do not have the ability to participate in an aquatic home program - this is not a barrier in any way to you participating in aquatic therapy as part of your current therapy plan! After Discharge from PT, you can continue using home program at  the [facility], there is a drop-in fee for $5 ($45 a month)or for 60 years  or older $4.00 ($40  a month for seniors ) or any local [facility] pool.  Memberships for purchase are available for gym/pool at [facility]  IT IS VERY IMPORTANT THAT YOUR LAST VISIT BE IN THE CLINIC AT [facility] AFTER YOUR LAST AQUATIC VISIT.  PLEASE MAKE SURE THAT YOU HAVE A [address]  APPOINTMENT SCHEDULED.   About the pool: Pool is located approximately 500 FT from the entrance of the building.  Please bring a support person if you need assistance traveling this      distance.   Your therapist will assist you in entering the water; there are two ways to           enter: stairs with railings, and a mechanical lift. Your therapist will determine the most appropriate way for you.  Water temperature is usually between 88-90 degrees  There may be up to 2 other swimmers in the pool at the same time  The pool deck is tile, please wear shoes with good traction if you prefer not to be barefoot.    Contact Info:  For appointment scheduling and cancellations:         Please call the [facility]  PH:[phone]              Aquatic Therapy  Outpatient Rehabilitation @ [facility]       All sessions are 45 minutes  Garen Lah, PT, A[facility] Certified Exercise Expert for the Aging Adult  10/13/23 10:05 AM Phone: [phone] Fax: [phone]

## 2023-10-16 ENCOUNTER — Other Ambulatory Visit: Payer: Self-pay

## 2023-10-16 DIAGNOSIS — M1712 Unilateral primary osteoarthritis, left knee: Secondary | ICD-10-CM

## 2023-10-18 MED ORDER — DICLOFENAC SODIUM 1 % EX GEL: 2.0000 g | Freq: Four times a day (QID) | CUTANEOUS | 2 refills | Status: DC | PRN

## 2023-10-20 NOTE — Therapy (Signed)
 OUTPATIENT PHYSICAL THERAPY LOWER EXTREMITY TREATMENT   Patient Name: [name] MRN: [id number] DOB:[birth year], 69 y.o., female Today's Date: 10/21/2023  END OF SESSION:  PT End of Session - 10/21/23 0905     Visit Number 2    Number of Visits 13    Date for PT Re-Evaluation 11/24/23    Authorization Type [employer] MCR    PT Start Time 0905    PT Stop Time 0948    PT Time Calculation (min) 43 min    Activity Tolerance Patient limited by fatigue;Patient limited by pain    Behavior During Therapy [facility] for tasks assessed/performed              Past Medical History:  Diagnosis Date   Arthritis    Atrial fibrillation (HCC)    Cataract    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 Nelle Don.    Hypercholesteremia    Hypertension    Hypertension    Hypertensive urgency 05/2006   Stroke (cerebrum) (HCC)    TIA (transient ischemic attack) 2017   Past Surgical History:  Procedure Laterality Date   ARTERY BIOPSY Right 09/18/2017   Procedure: BIOPSY OF RIGHT TEMPORAL ARTERY;  Surgeon: Abigail Miyamoto, MD;  Location: [facility] OR;  Service: General;  Laterality: Right;   COLON SURGERY     no per pt   DILATION AND CURETTAGE OF UTERUS  08/04/1974   MENISCUS REPAIR  10/03/2007   right knee   TONSILECTOMY, ADENOIDECTOMY, BILATERAL MYRINGOTOMY AND TUBES  age 75   TONSILLECTOMY     Patient Active Problem List   Diagnosis Date Noted   Poor nutrition 02/18/2023   Physical deconditioning 12/10/2022   Primary osteoarthritis of left knee 10/16/2022   Diabetes mellitus type 2, insulin dependent (HCC) 10/02/2021   Diabetic neuropathy (HCC) 06/21/2018   Chronic midline low back pain 06/21/2018   Mild obstructive sleep apnea 11/18/2013   Constipation  05/28/2011   Atrial fibrillation (HCC) 06/07/2009   Depression 09/29/2008   Hyperlipidemia associated with type 2 diabetes mellitus (HCC) 08/22/2008   CHRONIC KIDNEY DISEASE STAGE II (MILD) 08/01/2008   Essential hypertension 04/16/2007    PCP: Bess Kinds, MD   REFERRING PROVIDER: Persons, [place], [place]   REFERRING DIAG: [phone] (ICD-10-CM) - Pain in both knees, unspecified chronicity   THERAPY DIAG:  Chronic pain of both knees  Muscle weakness (generalized)  Difficulty in walking, not elsewhere classified  Other abnormalities of gait and mobility  Midline low back pain with left-sided sciatica, unspecified chronicity  Rationale for Evaluation and Treatment: Rehabilitation  ONSET DATE: Worsening over the past year   I have had knee pain for over 15 years  SUBJECTIVE:   SUBJECTIVE STATEMENT: 10-21-23  7/10 pain and my knees are cracking. I tried to do my exercises but I used my little cycle at my  feet that my dtr gave me for a 45 minutes Sunday after church.  I am busy after 2:30 every day chasing my grandchildren   EVAL-My doctor recommended PT for my arthritis and my weakness. I have been falling about 2 x a month because of my weakness. I have a hard time going up stairs.  My knees are very painful. I can  only stand and walk about 5 to 10 minutes. I can't get in bath tub  I dont sleep well only about 2 hours at a time. 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 Yes: NPRS scale: Right  knee 7/10 and at worst 8/10  Left knee 4/10 and at worst 9/10 Pain location: bil knees Pain description: achy sharp like somebody picking and stabbing Aggravating factors: standing more than 5 -10 min, getting up quickly to care for grandkids, stairs I can't take a bath tub  I sit on edge and do a sponge bath,   Relieving factors: salonpas patches and voltaren cream Back pain- at worst 9/10 and at rest 0/10  achy in mid back PRECAUTIONS: None  RED FLAGS: None   WEIGHT BEARING RESTRICTIONS: No  FALLS:  Has patient fallen in last 6 months? Yes. Number of falls 2 x a month if I get up quickly my knees feel like they " give way"   LIVING ENVIRONMENT: Lives with: lives with their family with dtr Lives in: House/apartment Stairs: Yes: External: 3 steps; can reach both Has following equipment at home: Single point cane and Walker - 4 wheeled  OCCUPATION: retired former Chief Strategy Officer and med tech  PLOF: Independent with household mobility with device and mostly sedentary  PATIENT GOALS: be able to walk with out stumbling and my knees giving out,   NEXT MD VISIT: TBD  OBJECTIVE:  Note: Objective measures were completed at Evaluation unless otherwise noted.  DIAGNOSTIC FINDINGS: see medical record  PATIENT SURVEYS:  LEFS 12/80  15%  COGNITION: Overall cognitive status: Within functional limits for tasks assessed     SENSATION: WFL Diabetic neuropathy  EDEMA:  NT  MUSCLE LENGTH: Hamstrings: Right bil tightness 54   POSTURE: rounded shoulders, forward head, flexed trunk , and obesity  PALPATION: Global tenderness around knee joint, arthritic pain   LUMBAR ROM:   AROM eval  Flexion Fingertips to knees bil  Extension 0  Right lateral flexion Limited 75%  Left lateral flexion Limted 75%  Right rotation Limited 75%  Left rotation Limited 75%   (Blank rows = not tested)   LOWER EXTREMITY ROM:  Active ROM Right eval Left eval  Hip flexion    Hip extension    Hip abduction    Hip adduction    Hip internal rotation    Hip external rotation    Knee flexion 116/ P 125 120/P127  Knee extension -7 -5  Ankle dorsiflexion 5 5  Ankle plantarflexion    Ankle inversion    Ankle eversion     (Blank rows = not tested)  LOWER EXTREMITY MMT:  MMT Right eval  Left eval  Hip flexion 4- 4-  Hip extension 3- 3-  Hip abduction 3- 4-  Hip adduction    Hip internal rotation    Hip external rotation    Knee flexion 4- 4  Knee extension 4- 4  Ankle dorsiflexion    Ankle plantarflexion 4/25 3/25  Ankle inversion    Ankle eversion     (Blank rows = not tested)  LOWER EXTREMITY SPECIAL TESTS:  NT  FUNCTIONAL TESTS:  5 times sit to stand: 53.32 sec 2 minute walk test: 131.8  Norm for age 4954ft  BERG BALANCE TEST 10-21-23 Sitting to Standing: 3.  Stands independently using hands Standing Unsupported: 4.      Stands safely for 2 minutes Sitting Unsupported: 4.     Sits for 2 minutes independently Standing to Sitting: 2.     Uses back of legs against chair to control descent  Transfers: 3.     Transfers safely definite use of hands Standing with eyes closed: 4.     Stands safely for 10 seconds  Standing with feet together: 4.     Stands for 1 minute safely Reaching forward with outstretched arm: 3.     Reaches forward 5 inches Retrieving object from the floor: 3.     Able to pick up with supervision Turning to look behind: 3.     Looks behind one side only, other side less weight shift Turning 360 degrees: 2.     Able to turn slowly, but safely Place alternate foot on stool: 3.     Completes 8 steps in >20 seconds Standing with one foot in front: 2.     Independent small step for 30 seconds Standing on one foot: 1.     Holds <3 seconds  Total Score: 42/56  Pt must use AD for balance GAIT: Distance walked: 131.8 ft Assistive device utilized: Single point cane Level of assistance: Modified independence Comments: Pt with antalgic gait and shortened stride length    [facility] Adult PT Treatment:                                                DATE: 10-21-23 Therapeutic Exercise: LAQ with 2 # cuff weight 2 x 10 on R and L Seated Heel toe raise 2 x 10 with 1 min rest LTR  3 x 20 sec hold R and L SLR  10 x R and L Standing hip extension R and L  1 x 10 Standing hip abduction R and L 1 x 5   Neuromuscular re-ed: SL  R and L  1 min each with UE support necessary for balance.  Pt cannot continue balance for more than 2 sec. With one minute rest between sets BERG balance 42/56  balance deficit  must use AD for safety Therapeutic Activity: STS 5 x with UE support on chair back STS 5 x with attempt to not use UE on chair back Marching in place with UE support to improve gait stride length                                                                                                                           TREATMENT DATE: eval and issue HEP    PATIENT EDUCATION:  Education details: POC, explanation of findings, issue HEP Person educated: Patient Education method: Explanation, Demonstration, Tactile cues, Verbal cues, and Handouts Education comprehension: verbalized understanding, returned demonstration, verbal cues required, tactile cues required, and needs  further education  HOME EXERCISE PROGRAM: Access Code: [email or web address] URL: [email or web address] Date: 10/13/2023 Prepared by: Garen Lah  Exercises - Supine Lower Trunk Rotation  - 1 x daily - 7 x weekly - 1 sets - 5 reps - 20 sec  hold - Supine Active Straight Leg Raise  - 1 x daily - 7 x weekly - 3 sets - 10 reps - Seated Heel Toe Raises  - 1 x daily - 7 x weekly - 3 sets - 10 reps - Sit to stand with sink support Movement snack  - 1 x daily - 7 x weekly - 3 sets - 5-10 reps - Seated Long Arc Quad  - 1 x daily - 7 x weekly - 3 sets - 10 reps  ASSESSMENT:  CLINICAL IMPRESSION: Ms Pal arrives in clinic and carries very heavy purse about 15 # minimum and uses cane to walk in clinic.  Pt reports that she did her heel toe exercise but not much else. PT emphasized importance of movement throughout the day and the negatives of sedentary lifestyle.  Pt BERG  42/56 and  definite use of AD for balance which is most attributed to weakness.  Pt HEP remained the  same in order for pt to develop consistency.  Session worked on closed session exercise and standing the most to develop endurance.   Will continue to work on HEP and pain free motion and exercise that pt will do consistently.    EVAL- Patient is a 68 y.o. female who was seen today for physical therapy evaluation and treatment for bil chronic knee pain. Pt also reports falling 2 x a month because of unsteadiness and weakness in knees.  Pt also complains of midline back pain that sometimes radiates into buttocks. Pt often cares for grandkids and feels ill equipped to respond in a quick manner to children's needs due to weakness.  Pt will benefit from skilled PT to address deconditioning, weakness and falls prevention.  OBJECTIVE IMPAIRMENTS: decreased activity tolerance, decreased balance, decreased knowledge of condition, decreased knowledge of use of DME, decreased mobility, difficulty walking, obesity, and pain.   ACTIVITY LIMITATIONS: standing, squatting, sleeping, stairs, bathing, hygiene/grooming, locomotion level, and caring for others  PARTICIPATION LIMITATIONS: meal prep, cleaning, laundry, shopping, community activity, and church  PERSONAL FACTORS:  See pertinent medical history and sedentary lifestyle  are also affecting patient's functional outcome.   REHAB POTENTIAL: Fair sedentary lifestyle and multiple falls  CLINICAL DECISION MAKING: Evolving/moderate complexity  EVALUATION COMPLEXITY: Moderate   GOALS: Goals reviewed with patient? Yes  SHORT TERM GOALS: Target date: 11-12-23 Pt will be independent with initial HEP Baseline:no knowledge Goal status: INITIAL  2.  Pt will begin to walk 10 minutes without stopping to show increased mobility Baseline: Pt unable to walk for 5 min without bil knee pain Goal status: INITIAL  3.  Demonstrate understanding of neutral posture and be more conscious of position and posture throughout the day.  Baseline: limited knowledge Goal  status: INITIAL  4. Pt will be educated on fall preparedness and verbalize at least 3 safety techniques to remain living in home independently to reduce falls  Baseline  No knowledge   LONG TERM GOALS: Target date: 12-08-23  Pt will be independent with advanced HEP.  Baseline: no knowledge Goal status: INITIAL  2.  Pt will be educated on sleeping aids and posture helps to receive 4 or more hours of sleep uninterrupted Baseline: Pt wakes every2 hours because of pain at night Goal status:  INITIAL  3.  Pt will be able to report at least 50% decrease in pain with mobilty Baseline: 8-9/10 pain level Goal status: INITIAL  4.  Pt will be able to demonstrate / simulate safe tub transfer in order to take a bath independently Baseline: unable to take a shower or a bath, and sits on side of tub for bathing Goal status: INITIAL  5.  Pt will be able to stand for 25 minutes in order to complete household chore/self care tasks Baseline: eval less than 5 minutes Goal status: INITIAL  6.  Patient will report improved functional level on LEFS >/= 50/80 in order to show improved mobility level Baseline: 12/80 15 % Goal status: INITIAL   PLAN:  PT FREQUENCY: 1-2x/week  PT DURATION: 6 weeks  PLANNED INTERVENTIONS: 97164- PT Re-evaluation, 97110-Therapeutic exercises, 97530- Therapeutic activity, 97112- Neuromuscular re-education, 97535- Self Care, 88416- Manual therapy, [phone]- Gait training, [phone]- Aquatic Therapy, [phone]- Electrical stimulation (manual), Patient/Family education, Balance training, Stair training, Taping, Dry Needling, Joint mobilization, Spinal mobilization, Cryotherapy, and Moist heat  PLAN FOR NEXT SESSION: BERG balance test, HEP and sit to stands  Garen Lah, PT, [facility] Certified Exercise Expert for the Aging Adult  10/21/23 9:55 AM Phone: [phone] Fax: [phone]   Date of referral: 09-29-23 Referring provider: Persons, [place], [place]  Referring diagnosis?  M25.561,M25.562 (ICD-10-CM) - Pain in both knees, unspecified chronicity  Treatment diagnosis? (if different than referring diagnosis) difficulty walking, chronic midline back pain without sciatica, muscle weakness   What was this (referring dx) caused by? Fall, Arthritis, and Other: generalized weakness  Ashby Dawes of Condition: Chronic (continuous duration > 3 months)   Laterality: Both  Current Functional Measure Score: LEFS 12/80  15 %  Objective measurements identify impairments when they are compared to normal values, the uninvolved extremity, and prior level of function.  [x]  Yes  []  No  Objective assessment of functional ability: Severe functional limitations   Briefly describe symptoms: bil knee pain, decreased mobility, back pain  How did symptoms start: OA, chronic condition  Average pain intensity:  Last 24 hours: 8-9/10  Past week: 8-9/10  How often does the pt experience symptoms? Constantly  How much have the symptoms interfered with usual daily activities? Quite a bit  How has condition changed since care began at this facility? NA - initial visit  In general, how is the patients overall health? Fair  DM neuropathy   BACK PAIN (STarT Back Screening Tool) Has pain spread down the leg(s) at some time in the last 2 weeks? Yes to buttocks Has there been pain in the shoulder or neck at some time in the last 2 weeks? no Has the pt only walked short distances because of back pain? No  knee pain limits Has patient dressed more slowly because of back pain in the past 2 weeks? yes Does patient think it's not safe for a person with this condition to be physically active? No but she is hesitant to move because of weakness and pain in knees Does patient have worrying thoughts a lot of the time? yes Does patient feel back pain is terrible and will never get any better? yes Has patient stopped enjoying things they usually enjoy? yes

## 2023-10-21 ENCOUNTER — Ambulatory Visit: Admitting: Physical Therapy

## 2023-10-21 DIAGNOSIS — R2689 Other abnormalities of gait and mobility: Secondary | ICD-10-CM

## 2023-10-21 DIAGNOSIS — M25562 Pain in left knee: Secondary | ICD-10-CM | POA: Diagnosis not present

## 2023-10-21 DIAGNOSIS — M6281 Muscle weakness (generalized): Secondary | ICD-10-CM

## 2023-10-21 DIAGNOSIS — M25561 Pain in right knee: Secondary | ICD-10-CM | POA: Diagnosis not present

## 2023-10-21 DIAGNOSIS — M5442 Lumbago with sciatica, left side: Secondary | ICD-10-CM

## 2023-10-21 DIAGNOSIS — G8929 Other chronic pain: Secondary | ICD-10-CM

## 2023-10-21 DIAGNOSIS — R262 Difficulty in walking, not elsewhere classified: Secondary | ICD-10-CM | POA: Diagnosis not present

## 2023-10-23 ENCOUNTER — Encounter: Payer: Self-pay | Admitting: Physical Therapy

## 2023-10-23 ENCOUNTER — Ambulatory Visit: Admitting: Physical Therapy

## 2023-10-23 DIAGNOSIS — M6281 Muscle weakness (generalized): Secondary | ICD-10-CM | POA: Diagnosis not present

## 2023-10-23 DIAGNOSIS — R2689 Other abnormalities of gait and mobility: Secondary | ICD-10-CM | POA: Diagnosis not present

## 2023-10-23 DIAGNOSIS — M25561 Pain in right knee: Secondary | ICD-10-CM | POA: Diagnosis not present

## 2023-10-23 DIAGNOSIS — M25562 Pain in left knee: Secondary | ICD-10-CM | POA: Diagnosis not present

## 2023-10-23 DIAGNOSIS — R262 Difficulty in walking, not elsewhere classified: Secondary | ICD-10-CM | POA: Diagnosis not present

## 2023-10-23 DIAGNOSIS — G8929 Other chronic pain: Secondary | ICD-10-CM | POA: Diagnosis not present

## 2023-10-23 DIAGNOSIS — M5442 Lumbago with sciatica, left side: Secondary | ICD-10-CM | POA: Diagnosis not present

## 2023-10-23 NOTE — Therapy (Signed)
 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 Nelle Don.    Hypercholesteremia    Hypertension    Hypertension    Hypertensive urgency 05/2006   Stroke (cerebrum) (HCC)    TIA (transient ischemic attack) 2017   Past Surgical History:  Procedure Laterality Date   ARTERY BIOPSY Right 09/18/2017   Procedure: BIOPSY OF RIGHT TEMPORAL ARTERY;  Surgeon: Abigail Miyamoto, MD;  Location: [facility] OR;  Service: General;  Laterality: Right;   COLON SURGERY     no per pt   DILATION AND CURETTAGE OF UTERUS  08/04/1974   MENISCUS REPAIR  10/03/2007   right knee   TONSILECTOMY, ADENOIDECTOMY, BILATERAL MYRINGOTOMY AND TUBES  age 42   TONSILLECTOMY     Patient Active Problem List   Diagnosis Date Noted   Poor nutrition 02/18/2023   Physical deconditioning 12/10/2022   Primary osteoarthritis of left knee 10/16/2022   Diabetes mellitus type 2, insulin dependent (HCC) 10/02/2021   Diabetic neuropathy (HCC) 06/21/2018   Chronic midline low back pain 06/21/2018   Mild obstructive sleep apnea 11/18/2013   Constipation 05/28/2011   Atrial fibrillation (HCC) 06/07/2009   Depression 09/29/2008   Hyperlipidemia associated with type 2 diabetes mellitus  (HCC) 08/22/2008   CHRONIC KIDNEY DISEASE STAGE II (MILD) 08/01/2008   Essential hypertension 04/16/2007    PCP: Bess Kinds, MD   REFERRING PROVIDER: Persons, [place], [place]   REFERRING DIAG: [phone] (ICD-10-CM) - Pain in both knees, unspecified chronicity   THERAPY DIAG:  Chronic pain of both knees  Muscle weakness (generalized)  Rationale for Evaluation and Treatment: Rehabilitation  ONSET DATE: Worsening over the past year   I have had knee pain for over 15 years  SUBJECTIVE:   SUBJECTIVE STATEMENT: 10/23/23: 6/10 stepped out of the van wrong and my knee went out to the side, right. Left knee is fine, no  pain. I walked down driveway and back.   10-21-23  7/10 pain and my knees are cracking. I tried to do my exercises but I used my little cycle at my  feet that my dtr gave me for a 45 minutes Sunday after church.  I am busy after 2:30 every day chasing my grandchildren   EVAL-My doctor recommended PT for my arthritis and my weakness. I have been falling about 2 x a month because of my weakness. I have a hard time going up stairs.  My knees are very painful. I can only stand and walk about 5 to 10 minutes. I can't get in bath  tub  I dont sleep well only about 2 hours at a time. 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 Yes: NPRS scale: Right  knee 6/10 and at worst 8/10  Left knee 0/10 and at worst 9/10 Pain location: bil knees Pain description: achy sharp like somebody picking and stabbing Aggravating factors: standing more than 5 -10 min, getting up quickly to care for grandkids, stairs I can't take a bath tub  I sit on edge and do a sponge bath,  Relieving factors: salonpas patches and voltaren cream Back pain- at worst 9/10 and at rest 0/10  achy in mid back PRECAUTIONS: None  RED  FLAGS: None   WEIGHT BEARING RESTRICTIONS: No  FALLS:  Has patient fallen in last 6 months? Yes. Number of falls 2 x a month if I get up quickly my knees feel like they " give way"   LIVING ENVIRONMENT: Lives with: lives with their family with dtr Lives in: House/apartment Stairs: Yes: External: 3 steps; can reach both Has following equipment at home: Single point cane and Walker - 4 wheeled  OCCUPATION: retired former Chief Strategy Officer and med tech  PLOF: Independent with household mobility with device and mostly sedentary  PATIENT GOALS: be able to walk with out stumbling and my knees giving out,   NEXT MD VISIT: TBD  OBJECTIVE:  Note: Objective measures were completed at Evaluation unless otherwise noted.  DIAGNOSTIC FINDINGS: see medical record  PATIENT SURVEYS:  LEFS 12/80  15%  COGNITION: Overall cognitive status: Within functional limits for tasks assessed     SENSATION: WFL Diabetic neuropathy  EDEMA:  NT  MUSCLE LENGTH: Hamstrings: Right bil tightness 54   POSTURE: rounded shoulders, forward head, flexed trunk , and obesity  PALPATION: Global tenderness around knee joint, arthritic pain   LUMBAR ROM:   AROM eval  Flexion Fingertips to knees bil  Extension 0  Right lateral flexion Limited 75%  Left lateral flexion Limted 75%  Right rotation Limited 75%  Left rotation Limited 75%   (Blank rows = not tested)   LOWER EXTREMITY ROM:  Active ROM Right eval Left eval  Hip flexion    Hip extension    Hip abduction    Hip adduction    Hip internal rotation    Hip external rotation    Knee flexion 116/ P 125 120/P127  Knee extension -7 -5  Ankle dorsiflexion 5 5  Ankle plantarflexion    Ankle inversion    Ankle eversion     (Blank rows = not tested)  LOWER EXTREMITY MMT:  MMT Right eval Left eval  Hip flexion 4- 4-  Hip extension 3- 3-  Hip abduction 3- 4-  Hip adduction    Hip internal rotation    Hip external rotation    Knee  flexion 4- 4  Knee extension 4- 4  Ankle dorsiflexion    Ankle plantarflexion 4/25 3/25  Ankle inversion    Ankle eversion     (Blank rows = not tested)  LOWER EXTREMITY SPECIAL TESTS:  NT  FUNCTIONAL TESTS:  5 times sit to stand: 53.32 sec 2 minute walk test: 131.8  Norm for age 6923ft  10/21/23 BERG Total Score: 42/56  Pt must use AD for balance  GAIT: Distance walked: 131.8 ft Assistive device utilized: Single point  cane Level of assistance: Modified independence Comments: Pt with antalgic gait and shortened stride length  [facility] Adult PT Treatment:                                                DATE: 10/23/23 Therapeutic Exercise: Supine SLR x 10 each  SAQ 5# ankle weights 10 x 2  Supine marching Green  band  10 x 2  Supine clam Green  band 10 x 2  Supine Band bridge x 10 LTR  Therapeutic Activity: Nustep L1 UE/LE x 6 minutes for endurance  Standing hip extension R and L 1 x 10 Standing hip abduction R and L 1 x 10 Standing heel raises x 10  STS x 5 , mat table , no UE       [facility] Adult PT Treatment:                                                DATE: 10-21-23 Therapeutic Exercise: LAQ with 2 # cuff weight 2 x 10 on R and L Seated Heel toe raise 2 x 10 with 1 min rest LTR  3 x 20 sec hold R and L SLR  10 x R and L Standing hip extension R and L 1 x 10 Standing hip abduction R and L 1 x 5   Neuromuscular re-ed: SL  R and L  1 min each with UE support necessary for balance.  Pt cannot continue balance for more than 2 sec. With one minute rest between sets BERG balance 42/56  balance deficit  must use AD for safety Therapeutic Activity: STS 5 x with UE support on chair back STS 5 x with attempt to not use UE on chair back Marching in place with UE support to improve gait stride length                                                                                                                           TREATMENT DATE: eval and issue HEP    PATIENT  EDUCATION:  Education details: POC, explanation of findings, issue HEP Person educated: Patient Education method: Explanation, Demonstration, Tactile cues, Verbal cues, and Handouts Education comprehension: verbalized understanding, returned demonstration, verbal cues required, tactile cues required, and needs further education  HOME EXERCISE PROGRAM: Access Code: [email or web address] URL: [email or web address] Date: 10/13/2023 Prepared by: Garen Lah  Exercises - Supine Lower Trunk Rotation  - 1 x daily - 7 x weekly - 1 sets - 5 reps - 20 sec  hold - Supine Active Straight Leg Raise  - 1 x daily - 7 x weekly - 3 sets - 10 reps - Seated Heel Toe Raises  - 1 x  daily - 7 x weekly - 3 sets - 10 reps - Sit to stand with sink support Movement snack  - 1 x daily - 7 x weekly - 3 sets - 5-10 reps - Seated Long Arc Quad  - 1 x daily - 7 x weekly - 3 sets - 10 reps  ASSESSMENT:  CLINICAL IMPRESSION: Pt reports increased knee pain after stepping out of Van wrong on arrival. 6/10 right knee pain, no left knee pain. Does endorse improvement in knee pain prior to that incident. Began Nustep for activity tolerance and continued standing activity and supine LE strengthening. She does report walking in driveway down and back 3 times yesterday and did well. Knee pain subsided after therex today.     Will continue to work on HEP and pain free motion and exercise that pt will do consistently.    EVAL- Patient is a 68 y.o. female who was seen today for physical therapy evaluation and treatment for bil chronic knee pain. Pt also reports falling 2 x a month because of unsteadiness and weakness in knees.  Pt also complains of midline back pain that sometimes radiates into buttocks. Pt often cares for grandkids and feels ill equipped to respond in a quick manner to children's needs due to weakness.  Pt will benefit from skilled PT to address deconditioning, weakness and falls prevention.  OBJECTIVE  IMPAIRMENTS: decreased activity tolerance, decreased balance, decreased knowledge of condition, decreased knowledge of use of DME, decreased mobility, difficulty walking, obesity, and pain.   ACTIVITY LIMITATIONS: standing, squatting, sleeping, stairs, bathing, hygiene/grooming, locomotion level, and caring for others  PARTICIPATION LIMITATIONS: meal prep, cleaning, laundry, shopping, community activity, and church  PERSONAL FACTORS:  See pertinent medical history and sedentary lifestyle  are also affecting patient's functional outcome.   REHAB POTENTIAL: Fair sedentary lifestyle and multiple falls  CLINICAL DECISION MAKING: Evolving/moderate complexity  EVALUATION COMPLEXITY: Moderate   GOALS: Goals reviewed with patient? Yes  SHORT TERM GOALS: Target date: 11-12-23 Pt will be independent with initial HEP Baseline:no knowledge Goal status: INITIAL  2.  Pt will begin to walk 10 minutes without stopping to show increased mobility Baseline: Pt unable to walk for 5 min without bil knee pain Goal status: INITIAL  3.  Demonstrate understanding of neutral posture and be more conscious of position and posture throughout the day.  Baseline: limited knowledge Goal status: INITIAL  4. Pt will be educated on fall preparedness and verbalize at least 3 safety techniques to remain living in home independently to reduce falls  Baseline  No knowledge   LONG TERM GOALS: Target date: 12-08-23  Pt will be independent with advanced HEP.  Baseline: no knowledge Goal status: INITIAL  2.  Pt will be educated on sleeping aids and posture helps to receive 4 or more hours of sleep uninterrupted Baseline: Pt wakes every2 hours because of pain at night Goal status: INITIAL  3.  Pt will be able to report at least 50% decrease in pain with mobilty Baseline: 8-9/10 pain level Goal status: INITIAL  4.  Pt will be able to demonstrate / simulate safe tub transfer in order to take a bath  independently Baseline: unable to take a shower or a bath, and sits on side of tub for bathing Goal status: INITIAL  5.  Pt will be able to stand for 25 minutes in order to complete household chore/self care tasks Baseline: eval less than 5 minutes Goal status: INITIAL  6.  Patient will report improved  functional level on LEFS >/= 50/80 in order to show improved mobility level Baseline: 12/80 15 % Goal status: INITIAL   PLAN:  PT FREQUENCY: 1-2x/week  PT DURATION: 6 weeks  PLANNED INTERVENTIONS: 97164- PT Re-evaluation, 97110-Therapeutic exercises, 97530- Therapeutic activity, 97112- Neuromuscular re-education, 97535- Self Care, 78469- Manual therapy, [phone]- Gait training, [phone]- Aquatic Therapy, [phone]- Electrical stimulation (manual), Patient/Family education, Balance training, Stair training, Taping, Dry Needling, Joint mobilization, Spinal mobilization, Cryotherapy, and Moist heat  PLAN FOR NEXT SESSION: HEP and sit to stands  Jannette Spanner, [place] 10/23/23 1:36 PM Phone: [phone] Fax: [phone]   Date of referral: 09-29-23 Referring provider: Persons, [place], [place]  Referring diagnosis? M25.561,M25.562 (ICD-10-CM) - Pain in both knees, unspecified chronicity  Treatment diagnosis? (if different than referring diagnosis) difficulty walking, chronic midline back pain without sciatica, muscle weakness   What was this (referring dx) caused by? Fall, Arthritis, and Other: generalized weakness  Ashby Dawes of Condition: Chronic (continuous duration > 3 months)   Laterality: Both  Current Functional Measure Score: LEFS 12/80  15 %  Objective measurements identify impairments when they are compared to normal values, the uninvolved extremity, and prior level of function.  [x]  Yes  []  No  Objective assessment of functional ability: Severe functional limitations   Briefly describe symptoms: bil knee pain, decreased mobility, back pain  How did symptoms start: OA, chronic  condition  Average pain intensity:  Last 24 hours: 8-9/10  Past week: 8-9/10  How often does the pt experience symptoms? Constantly  How much have the symptoms interfered with usual daily activities? Quite a bit  How has condition changed since care began at this facility? NA - initial visit  In general, how is the patients overall health? Fair  DM neuropathy   BACK PAIN (STarT Back Screening Tool) Has pain spread down the leg(s) at some time in the last 2 weeks? Yes to buttocks Has there been pain in the shoulder or neck at some time in the last 2 weeks? no Has the pt only walked short distances because of back pain? No  knee pain limits Has patient dressed more slowly because of back pain in the past 2 weeks? yes Does patient think it's not safe for a person with this condition to be physically active? No but she is hesitant to move because of weakness and pain in knees Does patient have worrying thoughts a lot of the time? yes Does patient feel back pain is terrible and will never get any better? yes Has patient stopped enjoying things they usually enjoy? yes

## 2023-10-27 NOTE — Therapy (Signed)
 OUTPATIENT PHYSICAL THERAPY LOWER EXTREMITY TREATMENT   Patient Name: [name] MRN: [id number] DOB:1956-07-22, 68 y.o., female Today's Date: 10/28/2023  END OF SESSION:  PT End of Session - 10/28/23 0930     Visit Number 4    Number of Visits 13    Date for PT Re-Evaluation 11/24/23    Authorization Type [employer] MCR    PT Start Time 0930    PT Stop Time 1015    PT Time Calculation (min) 45 min    Activity Tolerance Patient limited by fatigue;Patient limited by pain    Behavior During Therapy [facility] for tasks assessed/performed               Past Medical History:  Diagnosis Date   Arthritis    Atrial fibrillation (HCC)    Cataract    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 Nelle Don.    Hypercholesteremia    Hypertension    Hypertension    Hypertensive urgency 05/2006   Stroke (cerebrum) (HCC)    TIA (transient ischemic attack) 2017   Past Surgical History:  Procedure Laterality Date   ARTERY BIOPSY Right 09/18/2017   Procedure: BIOPSY OF RIGHT TEMPORAL ARTERY;  Surgeon: Abigail Miyamoto, MD;  Location: [facility] OR;  Service: General;  Laterality: Right;   COLON SURGERY     no per pt   DILATION AND CURETTAGE OF UTERUS  08/04/1974   MENISCUS REPAIR  10/03/2007   right knee   TONSILECTOMY, ADENOIDECTOMY, BILATERAL MYRINGOTOMY AND TUBES  age 85   TONSILLECTOMY     Patient Active Problem List   Diagnosis Date Noted   Poor nutrition 02/18/2023   Physical deconditioning 12/10/2022   Primary osteoarthritis of left knee 10/16/2022   Diabetes mellitus type 2, insulin dependent (HCC) 10/02/2021   Diabetic neuropathy (HCC) 06/21/2018   Chronic midline low back pain 06/21/2018   Mild obstructive sleep apnea 11/18/2013    Constipation 05/28/2011   Atrial fibrillation (HCC) 06/07/2009   Depression 09/29/2008   Hyperlipidemia associated with type 2 diabetes mellitus (HCC) 08/22/2008   CHRONIC KIDNEY DISEASE STAGE II (MILD) 08/01/2008   Essential hypertension 04/16/2007    PCP: Bess Kinds, MD   REFERRING PROVIDER: Persons, [place], [place]   REFERRING DIAG: [phone] (ICD-10-CM) - Pain in both knees, unspecified chronicity   THERAPY DIAG:  Chronic pain of both knees  Muscle weakness (generalized)  Difficulty in walking, not elsewhere classified  Midline low back pain with left-sided sciatica, unspecified chronicity  Other abnormalities of gait and mobility  Rationale for Evaluation and Treatment: Rehabilitation  ONSET DATE: Worsening over the past year   I have had knee pain for over 15 years  SUBJECTIVE:   SUBJECTIVE STATEMENT: 10/28/23: 6-7/10 pain with Right knee. I feel weak and it is hard to step out of [facility] doctor recommended PT for my arthritis and my weakness. I have been falling about 2 x a month because of my weakness. I have a hard time going up stairs.  My knees are very painful. I can only stand and walk about 5 to 10 minutes. I can't get in bath tub  I dont sleep well only about 2 hours at a time.  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 Yes: NPRS scale: Right  knee 6/10 and at worst 8/10  Left knee 0/10 and at worst 9/10 Pain location: bil knees Pain description: achy sharp like somebody picking and stabbing Aggravating factors: standing more than 5 -10 min, getting up quickly to care for grandkids, stairs I can't take a bath tub  I sit on edge and do a sponge bath,  Relieving factors: salonpas patches and voltaren cream Back pain- at worst 9/10 and at rest 0/10  achy in mid back PRECAUTIONS:  None  RED FLAGS: None   WEIGHT BEARING RESTRICTIONS: No  FALLS:  Has patient fallen in last 6 months? Yes. Number of falls 2 x a month if I get up quickly my knees feel like they " give way"   LIVING ENVIRONMENT: Lives with: lives with their family with dtr Lives in: House/apartment Stairs: Yes: External: 3 steps; can reach both Has following equipment at home: Single point cane and Walker - 4 wheeled  OCCUPATION: retired former Chief Strategy Officer and med tech  PLOF: Independent with household mobility with device and mostly sedentary  PATIENT GOALS: be able to walk with out stumbling and my knees giving out,   NEXT MD VISIT: TBD  OBJECTIVE:  Note: Objective measures were completed at Evaluation unless otherwise noted.  DIAGNOSTIC FINDINGS: see medical record  PATIENT SURVEYS:  LEFS 12/80  15%  COGNITION: Overall cognitive status: Within functional limits for tasks assessed     SENSATION: WFL Diabetic neuropathy  EDEMA:  NT  MUSCLE LENGTH: Hamstrings: Right bil tightness 54   POSTURE: rounded shoulders, forward head, flexed trunk , and obesity  PALPATION: Global tenderness around knee joint, arthritic pain   LUMBAR ROM:   AROM eval  Flexion Fingertips to knees bil  Extension 0  Right lateral flexion Limited 75%  Left lateral flexion Limted 75%  Right rotation Limited 75%  Left rotation Limited 75%   (Blank rows = not tested)   LOWER EXTREMITY ROM:  Active ROM Right eval Left eval  Hip flexion    Hip extension    Hip abduction    Hip adduction    Hip internal rotation    Hip external rotation    Knee flexion 116/ P 125 120/P127  Knee extension -7 -5  Ankle dorsiflexion 5 5  Ankle plantarflexion    Ankle inversion    Ankle eversion     (Blank rows = not tested)  LOWER EXTREMITY MMT:  MMT Right eval Left eval  Hip flexion 4- 4-  Hip extension 3- 3-  Hip abduction 3- 4-  Hip adduction    Hip internal rotation    Hip external  rotation    Knee flexion 4- 4  Knee extension 4- 4  Ankle dorsiflexion    Ankle plantarflexion 4/25 3/25  Ankle inversion    Ankle eversion     (Blank rows = not tested)  LOWER EXTREMITY SPECIAL TESTS:  NT  FUNCTIONAL TESTS:  5 times sit to stand: 53.32 sec 2 minute walk test: 131.8  Norm for age 5774ft 10-28-23  with 3 back stretch breaks  350.2 ft(1529ft to 198 ft Norm) 10/21/23 BERG Total Score: 42/56  Pt must use AD for balance  GAIT: Distance walked: 131.8 ft Assistive device utilized: Single  point cane Level of assistance: Modified independence Comments: Pt with antalgic gait and shortened stride length  [facility] Adult PT Treatment:                                                DATE: 10-28-23 Therapeutic Exercise:  SAQ 5# ankle weights 10 x 2  Supine SLR x 10 each  Supine marching Green  band  10 x 2  Supine clam Green  band 10 x 2  Supine Band bridge x 10 LTR  Therapeutic Activity: with 3 back stretch breaks  350.2 ft(1541ft to 198 ft Norm) STS x 10 , chair 18 inch, UE on knees LAQ 2 x 10 with 2lb cuff weights Step ups R and L 1 x 10  each with 2lb cuff weights CGA x 1 Standing heel raises x 10 with 2lb cuff weights  Self-care :  Pt educated on carrying 20 lb purse with her and problem solving ways to decrease weight     [facility] Adult PT Treatment:                                                DATE: 10/23/23 Therapeutic Exercise: Supine SLR x 10 each  SAQ 5# ankle weights 10 x 2  Supine marching Green  band  10 x 2  Supine clam Green  band 10 x 2  Supine Band bridge x 10 LTR  Therapeutic Activity: Nustep L1 UE/LE x 6 minutes for endurance  Standing hip extension R and L 1 x 10 Standing hip abduction R and L 1 x 10 Standing heel raises x 10  STS x 5 , mat table , no UE       [facility] Adult PT Treatment:                                                DATE: 10-21-23 Therapeutic Exercise: LAQ with 2 # cuff weight 2 x 10 on R and L Seated Heel toe raise  2 x 10 with 1 min rest LTR  3 x 20 sec hold R and L SLR  10 x R and L Standing hip extension R and L 1 x 10 Standing hip abduction R and L 1 x 5   Neuromuscular re-ed: SL  R and L  1 min each with UE support necessary for balance.  Pt cannot continue balance for more than 2 sec. With one minute rest between sets BERG balance 42/56  balance deficit  must use AD for safety Therapeutic Activity: STS 5 x with UE support on chair back STS 5 x with attempt to not use UE on chair back Marching in place with UE support to improve gait stride length  TREATMENT DATE: eval and issue HEP    PATIENT EDUCATION:  Education details: POC, explanation of findings, issue HEP Person educated: Patient Education method: Explanation, Demonstration, Tactile cues, Verbal cues, and Handouts Education comprehension: verbalized understanding, returned demonstration, verbal cues required, tactile cues required, and needs further education  HOME EXERCISE PROGRAM: Access Code: [email or web address] URL: [email or web address] Date: 10/13/2023 Prepared by: Garen Lah  Exercises - Supine Lower Trunk Rotation  - 1 x daily - 7 x weekly - 1 sets - 5 reps - 20 sec  hold - Supine Active Straight Leg Raise  - 1 x daily - 7 x weekly - 3 sets - 10 reps - Seated Heel Toe Raises  - 1 x daily - 7 x weekly - 3 sets - 10 reps - Sit to stand with sink support Movement snack  - 1 x daily - 7 x weekly - 3 sets - 5-10 reps - Seated Long Arc Quad  - 1 x daily - 7 x weekly - 3 sets - 10 reps  ASSESSMENT:  CLINICAL IMPRESSION: Pt reports increased knee pain today 6-7/10. Pt first with 3 back stretch breaks  350.2 ft(1579ft to 198 ft Norm). Pt session today working on increasing resistance to promote muscle strengthening and simulating exercise that will help her safely ascend and descend van steps  wit CGA x 1 for safety in clinic. after stepping out of [place] wrong on arrival. 6/10 right knee pain at end but states it is dying down in pain.  Pt has weakness and poor endurance but needs moderate cuing to complete exercises but is willing to do all she can.  Pt educated on decreasing weight of purse 15-20 # and also benefits of movement and walking       Will continue to work on HEP and pain free motion and exercise that pt will do consistently.    EVAL- Patient is a 68 y.o. female who was seen today for physical therapy evaluation and treatment for bil chronic knee pain. Pt also reports falling 2 x a month because of unsteadiness and weakness in knees.  Pt also complains of midline back pain that sometimes radiates into buttocks. Pt often cares for grandkids and feels ill equipped to respond in a quick manner to children's needs due to weakness.  Pt will benefit from skilled PT to address deconditioning, weakness and falls prevention.  OBJECTIVE IMPAIRMENTS: decreased activity tolerance, decreased balance, decreased knowledge of condition, decreased knowledge of use of DME, decreased mobility, difficulty walking, obesity, and pain.   ACTIVITY LIMITATIONS: standing, squatting, sleeping, stairs, bathing, hygiene/grooming, locomotion level, and caring for others  PARTICIPATION LIMITATIONS: meal prep, cleaning, laundry, shopping, community activity, and church  PERSONAL FACTORS:  See pertinent medical history and sedentary lifestyle  are also affecting patient's functional outcome.   REHAB POTENTIAL: Fair sedentary lifestyle and multiple falls  CLINICAL DECISION MAKING: Evolving/moderate complexity  EVALUATION COMPLEXITY: Moderate   GOALS: Goals reviewed with patient? Yes  SHORT TERM GOALS: Target date: 11-12-23 Pt will be independent with initial HEP Baseline:no knowledge Goal status: ONGOING  2.  Pt will begin to walk 10 minutes without stopping to show increased mobility Baseline: Pt  unable to walk for 5 min without bil knee pain 10-28-23 with 3 back stretch breaks  350.2 ft(157ft to 198 ft Norm) Goal status: INITIAL  3.  Demonstrate understanding of neutral posture and be more conscious of position and posture throughout the day.  Baseline: limited knowledge Goal status: ONGOING  4.  Pt will be educated on fall preparedness and verbalize at least 3 safety techniques to remain living in home independently to reduce falls  Baseline  No knowledge  Goal status: ONGOING   LONG TERM GOALS: Target date: 12-08-23  Pt will be independent with advanced HEP.  Baseline: no knowledge Goal status: INITIAL  2.  Pt will be educated on sleeping aids and posture helps to receive 4 or more hours of sleep uninterrupted Baseline: Pt wakes every2 hours because of pain at night Goal status: INITIAL  3.  Pt will be able to report at least 50% decrease in pain with mobilty Baseline: 8-9/10 pain level Goal status: INITIAL  4.  Pt will be able to demonstrate / simulate safe tub transfer in order to take a bath independently Baseline: unable to take a shower or a bath, and sits on side of tub for bathing Goal status: INITIAL  5.  Pt will be able to stand for 25 minutes in order to complete household chore/self care tasks Baseline: eval less than 5 minutes Goal status: INITIAL  6.  Patient will report improved functional level on LEFS >/= 50/80 in order to show improved mobility level Baseline: 12/80 15 % Goal status: INITIAL   PLAN:  PT FREQUENCY: 1-2x/week  PT DURATION: 6 weeks  PLANNED INTERVENTIONS: 97164- PT Re-evaluation, 97110-Therapeutic exercises, 97530- Therapeutic activity, 97112- Neuromuscular re-education, 97535- Self Care, 40981- Manual therapy, [phone]- Gait training, [phone]- Aquatic Therapy, [phone]- Electrical stimulation (manual), Patient/Family education, Balance training, Stair training, Taping, Dry Needling, Joint mobilization, Spinal mobilization,  Cryotherapy, and Moist heat  PLAN FOR NEXT SESSION: HEP and sit to stands  Garen Lah, PT, [facility] Certified Exercise Expert for the Aging Adult  10/28/23 10:16 AM Phone: [phone] Fax: [phone]   Date of referral: 09-29-23 Referring provider: Persons, [place], [place]  Referring diagnosis? M25.561,M25.562 (ICD-10-CM) - Pain in both knees, unspecified chronicity  Treatment diagnosis? (if different than referring diagnosis) difficulty walking, chronic midline back pain without sciatica, muscle weakness   What was this (referring dx) caused by? Fall, Arthritis, and Other: generalized weakness  Ashby Dawes of Condition: Chronic (continuous duration > 3 months)   Laterality: Both  Current Functional Measure Score: LEFS 12/80  15 %  Objective measurements identify impairments when they are compared to normal values, the uninvolved extremity, and prior level of function.  [x]  Yes  []  No  Objective assessment of functional ability: Severe functional limitations   Briefly describe symptoms: bil knee pain, decreased mobility, back pain  How did symptoms start: OA, chronic condition  Average pain intensity:  Last 24 hours: 8-9/10  Past week: 8-9/10  How often does the pt experience symptoms? Constantly  How much have the symptoms interfered with usual daily activities? Quite a bit  How has condition changed since care began at this facility? NA - initial visit  In general, how is the patients overall health? Fair  DM neuropathy   BACK PAIN (STarT Back Screening Tool) Has pain spread down the leg(s) at some time in the last 2 weeks? Yes to buttocks Has there been pain in the shoulder or neck at some time in the last 2 weeks? no Has the pt only walked short distances because of back pain? No  knee pain limits Has patient dressed more slowly because of back pain in the past 2 weeks? yes Does patient think it's not safe for a person with this condition to be physically active?  No but she is hesitant to move because  of weakness and pain in knees Does patient have worrying thoughts a lot of the time? yes Does patient feel back pain is terrible and will never get any better? yes Has patient stopped enjoying things they usually enjoy? yes

## 2023-10-28 ENCOUNTER — Ambulatory Visit: Admitting: Physical Therapy

## 2023-10-28 ENCOUNTER — Encounter: Payer: Self-pay | Admitting: Physical Therapy

## 2023-10-28 DIAGNOSIS — R262 Difficulty in walking, not elsewhere classified: Secondary | ICD-10-CM

## 2023-10-28 DIAGNOSIS — M5442 Lumbago with sciatica, left side: Secondary | ICD-10-CM

## 2023-10-28 DIAGNOSIS — G8929 Other chronic pain: Secondary | ICD-10-CM | POA: Diagnosis not present

## 2023-10-28 DIAGNOSIS — R2689 Other abnormalities of gait and mobility: Secondary | ICD-10-CM

## 2023-10-28 DIAGNOSIS — M6281 Muscle weakness (generalized): Secondary | ICD-10-CM | POA: Diagnosis not present

## 2023-10-28 DIAGNOSIS — M25562 Pain in left knee: Secondary | ICD-10-CM | POA: Diagnosis not present

## 2023-10-28 DIAGNOSIS — M25561 Pain in right knee: Secondary | ICD-10-CM | POA: Diagnosis not present

## 2023-10-29 NOTE — Therapy (Signed)
 OUTPATIENT PHYSICAL THERAPY LOWER EXTREMITY TREATMENT   Patient Name: [name] MRN: [id number] DOB:[birth year], 68 y.o., female Today's Date: 10/30/2023  END OF SESSION:  PT End of Session - 10/30/23 1135     Visit Number 5    Number of Visits 13    Date for PT Re-Evaluation 11/24/23    Authorization Type [employer] MCR    PT Start Time 1135    PT Stop Time 1220    PT Time Calculation (min) 45 min    Activity Tolerance Patient limited by fatigue;Patient limited by pain    Behavior During Therapy [facility] for tasks assessed/performed             Past Medical History:  Diagnosis Date   Arthritis    Atrial fibrillation (HCC)    Cataract    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 Nelle Don.    Hypercholesteremia    Hypertension    Hypertension    Hypertensive urgency 05/2006   Stroke (cerebrum) (HCC)    TIA (transient ischemic attack) 2017   Past Surgical History:  Procedure Laterality Date   ARTERY BIOPSY Right 09/18/2017   Procedure: BIOPSY OF RIGHT TEMPORAL ARTERY;  Surgeon: Abigail Miyamoto, MD;  Location: [facility] OR;  Service: General;  Laterality: Right;   COLON SURGERY     no per pt   DILATION AND CURETTAGE OF UTERUS  08/04/1974   MENISCUS REPAIR  10/03/2007   right knee   TONSILECTOMY, ADENOIDECTOMY, BILATERAL MYRINGOTOMY AND TUBES  age 39   TONSILLECTOMY     Patient Active Problem List   Diagnosis Date Noted   Poor nutrition 02/18/2023   Physical deconditioning 12/10/2022   Primary osteoarthritis of left knee 10/16/2022   Diabetes mellitus type 2, insulin dependent (HCC) 10/02/2021   Diabetic neuropathy (HCC) 06/21/2018   Chronic midline low back pain 06/21/2018   Mild obstructive sleep apnea 11/18/2013   Constipation  05/28/2011   Atrial fibrillation (HCC) 06/07/2009   Depression 09/29/2008   Hyperlipidemia associated with type 2 diabetes mellitus (HCC) 08/22/2008   CHRONIC KIDNEY DISEASE STAGE II (MILD) 08/01/2008   Essential hypertension 04/16/2007    PCP: Bess Kinds, MD   REFERRING PROVIDER: Persons, [place], [place]   REFERRING DIAG: [phone] (ICD-10-CM) - Pain in both knees, unspecified chronicity   THERAPY DIAG:  Chronic pain of both knees  Muscle weakness (generalized)  Difficulty in walking, not elsewhere classified  Midline low back pain with left-sided sciatica, unspecified chronicity  Rationale for Evaluation and Treatment: Rehabilitation  ONSET DATE: Worsening over the past year   I have had knee pain for over 15 years  SUBJECTIVE:   SUBJECTIVE STATEMENT: Patient reports continued BIL knee pain as well as lower back pain.  EVAL-My doctor recommended PT for my arthritis and my weakness. I have been falling about 2 x a month because of my weakness. I have a hard time going up stairs.  My knees are very painful. I can only stand and walk about 5 to 10 minutes. I can't get in bath tub  I dont sleep well only about 2 hours at a time. 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 Yes: NPRS scale: Right  knee 6/10 and at worst 8/10  Left knee 0/10 and at worst 9/10 Pain location: bil knees Pain description: achy sharp like somebody picking and stabbing Aggravating factors: standing more than 5 -10 min, getting up quickly to care for grandkids, stairs I can't take a bath tub  I sit on edge and do a sponge bath,  Relieving factors: salonpas patches and voltaren cream Back pain- at worst 9/10 and at rest 0/10  achy in mid back PRECAUTIONS: None  RED FLAGS: None   WEIGHT BEARING RESTRICTIONS: No  FALLS:  Has  patient fallen in last 6 months? Yes. Number of falls 2 x a month if I get up quickly my knees feel like they " give way"   LIVING ENVIRONMENT: Lives with: lives with their family with dtr Lives in: House/apartment Stairs: Yes: External: 3 steps; can reach both Has following equipment at home: Single point cane and Walker - 4 wheeled  OCCUPATION: retired former Chief Strategy Officer and med tech  PLOF: Independent with household mobility with device and mostly sedentary  PATIENT GOALS: be able to walk with out stumbling and my knees giving out,   NEXT MD VISIT: TBD  OBJECTIVE:  Note: Objective measures were completed at Evaluation unless otherwise noted.  DIAGNOSTIC FINDINGS: see medical record  PATIENT SURVEYS:  LEFS 12/80  15%  COGNITION: Overall cognitive status: Within functional limits for tasks assessed     SENSATION: WFL Diabetic neuropathy  EDEMA:  NT  MUSCLE LENGTH: Hamstrings: Right bil tightness 54   POSTURE: rounded shoulders, forward head, flexed trunk , and obesity  PALPATION: Global tenderness around knee joint, arthritic pain   LUMBAR ROM:   AROM eval  Flexion Fingertips to knees bil  Extension 0  Right lateral flexion Limited 75%  Left lateral flexion Limted 75%  Right rotation Limited 75%  Left rotation Limited 75%   (Blank rows = not tested)   LOWER EXTREMITY ROM:  Active ROM Right eval Left eval  Hip flexion    Hip extension    Hip abduction    Hip adduction    Hip internal rotation    Hip external rotation    Knee flexion 116/ P 125 120/P127  Knee extension -7 -5  Ankle dorsiflexion 5 5  Ankle plantarflexion    Ankle inversion    Ankle eversion     (Blank rows = not tested)  LOWER EXTREMITY MMT:  MMT Right eval Left eval  Hip flexion 4- 4-  Hip extension 3- 3-  Hip abduction 3- 4-  Hip adduction    Hip internal rotation    Hip external rotation    Knee flexion 4- 4  Knee extension 4- 4  Ankle dorsiflexion    Ankle  plantarflexion 4/25 3/25  Ankle inversion    Ankle eversion     (Blank rows = not tested)  LOWER EXTREMITY SPECIAL TESTS:  NT  FUNCTIONAL TESTS:  5 times sit to stand: 53.32 sec 2 minute walk test: 131.8  Norm for age 9928ft 10-28-23  with 3 back stretch breaks  350.2 ft(1551ft to 198 ft Norm) 10/21/23 BERG Total Score: 42/56  Pt must use AD for balance  GAIT: Distance walked: 131.8 ft Assistive device utilized: Single point cane Level of assistance: Modified independence Comments: Pt with antalgic gait and shortened stride length  [facility] Adult PT Treatment:                                                DATE: 10/30/23 Aquatic therapy at [facility]- [address] - therapeutic pool temp approximately 91 degrees. Pt enters building ambulating with SPC. Treatment took place in water 3.8 to  4 ft 8 in. deep depending upon activity.  Pt entered and exited the pool via stair and handrails independently with step to pattern, apprehensive on last step. Patient entered water for aquatic therapy for first time and was introduced to principles and therapeutic effects of water as they ambulated and acclimated to pool.  Therapeutic Exercise: Aquatic Exercise: Sidestepping hanging onto wall x4 steps x3 laps Step ups on bottom step fwd x10 BIL Standing with UE support edge of pool: Hip abd/add x10 BIL Hamstring curl x10 BIL Hip extension x10 BIL Squats 2x10 Heel/toe raises 2x10 Standing marching x10 BIL Hip ext/flex with knee straight x 10 BIL L stretch on wall 10" hold x3 Hip Circles CW x10 BIL  Pt requires the buoyancy of water for active assisted exercises with buoyancy supported for strengthening and AROM exercises. Hydrostatic pressure also supports joints by unweighting joint load by at least 50 % in 3-4 feet depth water. 80% in chest to neck deep water. Water will provide assistance with movement using the current and laminar flow while the buoyancy reduces weight bearing. Pt  requires the viscosity of the water for resistance with strengthening exercises.   [facility] Adult PT Treatment:                                                DATE: 10-28-23 Therapeutic Exercise:  SAQ 5# ankle weights 10 x 2  Supine SLR x 10 each  Supine marching Green  band  10 x 2  Supine clam Green  band 10 x 2  Supine Band bridge x 10 LTR  Therapeutic Activity: with 3 back stretch breaks  350.2 ft(1517ft to 198 ft Norm) STS x 10 , chair 18 inch, UE on knees LAQ 2 x 10 with 2lb cuff weights Step ups R and L 1 x 10  each with 2lb cuff weights CGA x 1 Standing heel raises x 10 with 2lb cuff weights  Self-care :  Pt educated on carrying 20 lb purse with her and problem solving ways to decrease weight     [facility] Adult PT Treatment:                                                DATE: 10/23/23 Therapeutic Exercise: Supine SLR x 10 each  SAQ 5# ankle weights 10 x 2  Supine marching Green  band  10 x 2  Supine clam Green  band 10 x 2  Supine Band bridge x 10 LTR  Therapeutic Activity: Nustep L1 UE/LE x 6 minutes for endurance  Standing hip extension R and L 1 x 10 Standing hip abduction R and L 1 x 10 Standing heel raises x 10  STS x 5 , mat table , no UE  PATIENT EDUCATION:  Education details: POC, explanation of findings, issue HEP Person educated: Patient Education method: Explanation, Demonstration, Tactile cues, Verbal cues, and Handouts Education comprehension: verbalized understanding, returned demonstration, verbal cues required, tactile cues required, and needs further education  HOME EXERCISE PROGRAM: Access Code: [email or web address] URL: [email or web address] Date: 10/13/2023 Prepared by: Garen Lah  Exercises - Supine Lower Trunk Rotation  - 1 x daily - 7 x weekly - 1 sets - 5 reps - 20 sec  hold - Supine Active Straight Leg Raise  - 1 x daily - 7 x weekly - 3 sets - 10 reps - Seated Heel Toe Raises  - 1 x daily - 7 x weekly - 3 sets - 10 reps -  Sit to stand with sink support Movement snack  - 1 x daily - 7 x weekly - 3 sets - 5-10 reps - Seated Long Arc Quad  - 1 x daily - 7 x weekly - 3 sets - 10 reps  ASSESSMENT:  CLINICAL IMPRESSION: Patient presents to first aquatic PT session reporting continued BIL knee pain. She enters the water apprehensive, stayed with outer edge of wall during session for comfort. She moves with slow, guarded movements throughout session, but reports that the water feels better on her knees and lower back. Session today focused on improving standing activity tolerance and LE strengthening in the aquatic environment for use of buoyancy to offload joints and the viscosity of water as resistance during therapeutic exercise. Patient was able to tolerate all prescribed exercises in the aquatic environment with no adverse effects. Patient continues to benefit from skilled PT services on land and aquatic based and should be progressed as able to improve functional independence.    EVAL- Patient is a 69 y.o. female who was seen today for physical therapy evaluation and treatment for bil chronic knee pain. Pt also reports falling 2 x a month because of unsteadiness and weakness in knees.  Pt also complains of midline back pain that sometimes radiates into buttocks. Pt often cares for grandkids and feels ill equipped to respond in a quick manner to children's needs due to weakness.  Pt will benefit from skilled PT to address deconditioning, weakness and falls prevention.  OBJECTIVE IMPAIRMENTS: decreased activity tolerance, decreased balance, decreased knowledge of condition, decreased knowledge of use of DME, decreased mobility, difficulty walking, obesity, and pain.   ACTIVITY LIMITATIONS: standing, squatting, sleeping, stairs, bathing, hygiene/grooming, locomotion level, and caring for others  PARTICIPATION LIMITATIONS: meal prep, cleaning, laundry, shopping, community activity, and church  PERSONAL FACTORS:  See  pertinent medical history and sedentary lifestyle  are also affecting patient's functional outcome.   REHAB POTENTIAL: Fair sedentary lifestyle and multiple falls  CLINICAL DECISION MAKING: Evolving/moderate complexity  EVALUATION COMPLEXITY: Moderate   GOALS: Goals reviewed with patient? Yes  SHORT TERM GOALS: Target date: 11-12-23 Pt will be independent with initial HEP Baseline:no knowledge Goal status: ONGOING  2.  Pt will begin to walk 10 minutes without stopping to show increased mobility Baseline: Pt unable to walk for 5 min without bil knee pain 10-28-23 with 3 back stretch breaks  350.2 ft(1529ft to 198 ft Norm) Goal status: INITIAL  3.  Demonstrate understanding of neutral posture and be more conscious of position and posture throughout the day.  Baseline: limited knowledge Goal status: ONGOING  4. Pt will be educated on fall preparedness and verbalize at least 3 safety techniques to remain living in home independently to reduce falls  Baseline  No knowledge  Goal status: ONGOING   LONG TERM GOALS: Target date: 12-08-23  Pt will be independent with advanced HEP.  Baseline: no knowledge Goal status: INITIAL  2.  Pt will be educated on sleeping aids and posture helps to receive 4 or more hours of sleep uninterrupted Baseline: Pt wakes every2 hours because of pain at night Goal status: INITIAL  3.  Pt will be able to report at least 50% decrease in pain with mobilty Baseline: 8-9/10 pain level Goal status: INITIAL  4.  Pt will be able to demonstrate / simulate safe tub transfer in order to take a bath independently Baseline: unable to take a shower or a bath, and sits on side of tub for bathing Goal status: INITIAL  5.  Pt will be able to stand for 25 minutes in order to complete household chore/self care tasks Baseline: eval less than 5 minutes Goal status: INITIAL  6.  Patient will report improved functional level on LEFS >/= 50/80 in order to show  improved mobility level Baseline: 12/80 15 % Goal status: INITIAL   PLAN:  PT FREQUENCY: 1-2x/week  PT DURATION: 6 weeks  PLANNED INTERVENTIONS: 97164- PT Re-evaluation, 97110-Therapeutic exercises, 97530- Therapeutic activity, 97112- Neuromuscular re-education, 97535- Self Care, 78295- Manual therapy, [phone]- Gait training, [phone]- Aquatic Therapy, [phone]- Electrical stimulation (manual), Patient/Family education, Balance training, Stair training, Taping, Dry Needling, Joint mobilization, Spinal mobilization, Cryotherapy, and Moist heat  PLAN FOR NEXT SESSION: HEP and sit to stands  Berta Minor PTA  10/30/23 12:20 PM

## 2023-10-30 ENCOUNTER — Ambulatory Visit

## 2023-10-30 DIAGNOSIS — R262 Difficulty in walking, not elsewhere classified: Secondary | ICD-10-CM | POA: Diagnosis not present

## 2023-10-30 DIAGNOSIS — M25561 Pain in right knee: Secondary | ICD-10-CM | POA: Diagnosis not present

## 2023-10-30 DIAGNOSIS — M25562 Pain in left knee: Secondary | ICD-10-CM | POA: Diagnosis not present

## 2023-10-30 DIAGNOSIS — M6281 Muscle weakness (generalized): Secondary | ICD-10-CM | POA: Diagnosis not present

## 2023-10-30 DIAGNOSIS — M5442 Lumbago with sciatica, left side: Secondary | ICD-10-CM | POA: Diagnosis not present

## 2023-10-30 DIAGNOSIS — G8929 Other chronic pain: Secondary | ICD-10-CM | POA: Diagnosis not present

## 2023-10-30 DIAGNOSIS — R2689 Other abnormalities of gait and mobility: Secondary | ICD-10-CM | POA: Diagnosis not present

## 2023-11-02 ENCOUNTER — Other Ambulatory Visit: Payer: Self-pay | Admitting: Student

## 2023-11-02 DIAGNOSIS — E1140 Type 2 diabetes mellitus with diabetic neuropathy, unspecified: Secondary | ICD-10-CM

## 2023-11-04 ENCOUNTER — Encounter: Payer: Self-pay | Admitting: Physical Therapy

## 2023-11-04 ENCOUNTER — Ambulatory Visit: Attending: Physician Assistant | Admitting: Physical Therapy

## 2023-11-04 DIAGNOSIS — M5442 Lumbago with sciatica, left side: Secondary | ICD-10-CM | POA: Insufficient documentation

## 2023-11-04 DIAGNOSIS — M6281 Muscle weakness (generalized): Secondary | ICD-10-CM | POA: Diagnosis not present

## 2023-11-04 DIAGNOSIS — R262 Difficulty in walking, not elsewhere classified: Secondary | ICD-10-CM | POA: Diagnosis not present

## 2023-11-04 DIAGNOSIS — R2689 Other abnormalities of gait and mobility: Secondary | ICD-10-CM | POA: Diagnosis not present

## 2023-11-04 DIAGNOSIS — M25561 Pain in right knee: Secondary | ICD-10-CM | POA: Insufficient documentation

## 2023-11-04 DIAGNOSIS — G8929 Other chronic pain: Secondary | ICD-10-CM | POA: Insufficient documentation

## 2023-11-04 DIAGNOSIS — M25562 Pain in left knee: Secondary | ICD-10-CM | POA: Diagnosis not present

## 2023-11-04 NOTE — Therapy (Signed)
 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 Nelle Don.    Hypercholesteremia    Hypertension    Hypertension    Hypertensive urgency 05/2006   Stroke (cerebrum) (HCC)    TIA (transient ischemic attack) 2017   Past Surgical History:  Procedure Laterality Date   ARTERY BIOPSY Right 09/18/2017   Procedure: BIOPSY OF RIGHT TEMPORAL ARTERY;  Surgeon: Abigail Miyamoto, MD;  Location: [facility] OR;  Service: General;  Laterality: Right;   COLON SURGERY     no per pt   DILATION AND CURETTAGE OF UTERUS  08/04/1974   MENISCUS REPAIR  10/03/2007   right knee   TONSILECTOMY, ADENOIDECTOMY, BILATERAL MYRINGOTOMY AND TUBES  age 76   TONSILLECTOMY     Patient Active Problem List   Diagnosis Date Noted   Poor nutrition 02/18/2023   Physical deconditioning 12/10/2022   Primary osteoarthritis of left knee 10/16/2022   Diabetes mellitus type 2, insulin dependent (HCC) 10/02/2021   Diabetic neuropathy (HCC) 06/21/2018   Chronic midline low back pain 06/21/2018   Mild obstructive sleep apnea 11/18/2013   Constipation 05/28/2011   Atrial fibrillation (HCC) 06/07/2009   Depression 09/29/2008   Hyperlipidemia associated with type 2 diabetes mellitus  (HCC) 08/22/2008   CHRONIC KIDNEY DISEASE STAGE II (MILD) 08/01/2008   Essential hypertension 04/16/2007    PCP: Bess Kinds, MD   REFERRING PROVIDER: Persons, [place], [place]   REFERRING DIAG: [phone] (ICD-10-CM) - Pain in both knees, unspecified chronicity   THERAPY DIAG:  Chronic pain of both knees  Muscle weakness (generalized)  Rationale for Evaluation and Treatment: Rehabilitation  ONSET DATE: Worsening over the past year   I have had knee pain for over 15 years  SUBJECTIVE:   SUBJECTIVE STATEMENT: Pt reports knees have felt better since aquatic therapy. "I took some stuff out of my purse." (Purse weighs 14 lbs on clinic scale)  EVAL-My doctor recommended PT for my arthritis and my weakness. I have been falling about 2 x a month because of my weakness. I have a hard time going up stairs.  My knees are very painful. I can only stand and walk about 5 to 10 minutes. I can't get in bath tub  I dont sleep well only about 2 hours at a time. 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 Yes: NPRS scale: Right  knee 0/10 and at worst 8/10  Left knee 0/10 and at worst 9/10 Pain location: bil knees Pain description: achy sharp like somebody picking and stabbing Aggravating factors: standing more than 5 -10 min, getting up quickly to care for grandkids, stairs I can't take a bath tub  I sit on edge and do a sponge bath,  Relieving factors: salonpas patches and voltaren cream Back pain- at worst 9/10 and at rest 0/10  achy in mid back PRECAUTIONS: None  RED FLAGS: None   WEIGHT BEARING RESTRICTIONS: No  FALLS:  Has patient fallen in last 6 months? Yes. Number of falls 2 x a month if I get up quickly my knees feel like they " give way"   LIVING ENVIRONMENT: Lives with: lives with their family with dtr Lives  in: House/apartment Stairs: Yes: External: 3 steps; can reach both Has following equipment at home: Single point cane and Walker - 4 wheeled  OCCUPATION: retired former Chief Strategy Officer and med tech  PLOF: Independent with household mobility with device and mostly sedentary  PATIENT GOALS: be able to walk with out stumbling and my knees giving out,   NEXT MD VISIT: TBD  OBJECTIVE:  Note: Objective measures were completed at Evaluation unless otherwise noted.  DIAGNOSTIC FINDINGS: see medical record  PATIENT SURVEYS:  LEFS 12/80  15%  COGNITION: Overall cognitive status: Within functional limits for tasks assessed     SENSATION: WFL Diabetic neuropathy  EDEMA:  NT  MUSCLE LENGTH: Hamstrings: Right bil tightness 54   POSTURE: rounded shoulders, forward head, flexed trunk , and obesity  PALPATION: Global tenderness around knee joint, arthritic pain   LUMBAR ROM:   AROM eval  Flexion Fingertips to knees bil  Extension 0  Right lateral flexion Limited 75%  Left lateral flexion Limted 75%  Right rotation Limited 75%  Left rotation Limited 75%   (Blank rows = not tested)   LOWER EXTREMITY ROM:  Active ROM Right eval Left eval  Hip flexion    Hip extension    Hip abduction    Hip adduction    Hip internal rotation    Hip external rotation    Knee flexion 116/ P 125 120/P127  Knee extension -7 -5  Ankle dorsiflexion 5 5  Ankle plantarflexion    Ankle inversion    Ankle eversion     (Blank rows = not tested)  LOWER EXTREMITY MMT:  MMT Right eval Left eval  Hip flexion 4- 4-  Hip extension 3- 3-  Hip abduction 3- 4-  Hip adduction    Hip internal rotation    Hip external rotation    Knee flexion 4- 4  Knee extension 4- 4  Ankle dorsiflexion    Ankle plantarflexion 4/25 3/25  Ankle inversion    Ankle eversion     (Blank rows = not tested)  LOWER EXTREMITY SPECIAL TESTS:  NT  FUNCTIONAL TESTS:  5 times sit to stand: 53.32 sec; 11/04/23:  29.7 sec standard chair hands on thighs.  2 minute walk test: 131.8  Norm for age 1429ft 10-28-23  with 3 back stretch breaks  350.2 ft(1568ft to 198 ft Norm) 10/21/23 BERG Total Score: 42/56  Pt must use AD for balance  GAIT: Distance walked: 131.8 ft Assistive device utilized: Single point cane Level of assistance: Modified independence Comments: Pt with antalgic gait and shortened stride length   [facility] Adult PT Treatment:  DATE: 11/04/23 Therapeutic Exercise: LAQ 3# 10 x 3  Supine March GTB  Supine Clam GTB  Bridge 10 x 2  LTR SLR 10 x 1 each  STS 5 x 2    Self Care: Pt able to verbalize fall prevention: anti slip mats in shower, grab bars, keep clutter cleaned up Further discussed fall prevention including, night lights, removal of throw rugs, no electrical cords in pathways.     [facility] Adult PT Treatment:                                                DATE: 10/30/23 Aquatic therapy at [facility]- [address] - therapeutic pool temp approximately 91 degrees. Pt enters building ambulating with SPC. Treatment took place in water 3.8 to  4 ft 8 in. deep depending upon activity.  Pt entered and exited the pool via stair and handrails independently with step to pattern, apprehensive on last step. Patient entered water for aquatic therapy for first time and was introduced to principles and therapeutic effects of water as they ambulated and acclimated to pool.  Therapeutic Exercise: Aquatic Exercise: Sidestepping hanging onto wall x4 steps x3 laps Step ups on bottom step fwd x10 BIL Standing with UE support edge of pool: Hip abd/add x10 BIL Hamstring curl x10 BIL Hip extension x10 BIL Squats 2x10 Heel/toe raises 2x10 Standing marching x10 BIL Hip ext/flex with knee straight x 10 BIL L stretch on wall 10" hold x3 Hip Circles CW x10 BIL  Pt requires the buoyancy of water for active assisted exercises with buoyancy supported  for strengthening and AROM exercises. Hydrostatic pressure also supports joints by unweighting joint load by at least 50 % in 3-4 feet depth water. 80% in chest to neck deep water. Water will provide assistance with movement using the current and laminar flow while the buoyancy reduces weight bearing. Pt requires the viscosity of the water for resistance with strengthening exercises.   [facility] Adult PT Treatment:                                                DATE: 10-28-23 Therapeutic Exercise:  SAQ 5# ankle weights 10 x 2  Supine SLR x 10 each  Supine marching Green  band  10 x 2  Supine clam Green  band 10 x 2  Supine Band bridge x 10 LTR  Therapeutic Activity: with 3 back stretch breaks  350.2 ft(1536ft to 198 ft Norm) STS x 10 , chair 18 inch, UE on knees LAQ 2 x 10 with 2lb cuff weights Step ups R and L 1 x 10  each with 2lb cuff weights CGA x 1 Standing heel raises x 10 with 2lb cuff weights  Self-care :  Pt educated on carrying 20 lb purse with her and problem solving ways to decrease weight     [facility] Adult PT Treatment:                                                DATE: 10/23/23 Therapeutic Exercise: Supine SLR x 10 each  SAQ 5#  ankle weights 10 x 2  Supine marching Green  band  10 x 2  Supine clam Green  band 10 x 2  Supine Band bridge x 10 LTR  Therapeutic Activity: Nustep L1 UE/LE x 6 minutes for endurance  Standing hip extension R and L 1 x 10 Standing hip abduction R and L 1 x 10 Standing heel raises x 10  STS x 5 , mat table , no UE    PATIENT EDUCATION:  Education details: POC, explanation of findings, issue HEP Person educated: Patient Education method: Explanation, Demonstration, Tactile cues, Verbal cues, and Handouts Education comprehension: verbalized understanding, returned demonstration, verbal cues required, tactile cues required, and needs further education  HOME EXERCISE PROGRAM: Access Code: [email or web address] URL:  [email or web address] Date: 10/13/2023 Prepared by: Garen Lah  Exercises - Supine Lower Trunk Rotation  - 1 x daily - 7 x weekly - 1 sets - 5 reps - 20 sec  hold - Supine Active Straight Leg Raise  - 1 x daily - 7 x weekly - 3 sets - 10 reps - Seated Heel Toe Raises  - 1 x daily - 7 x weekly - 3 sets - 10 reps - Sit to stand with sink support Movement snack  - 1 x daily - 7 x weekly - 3 sets - 5-10 reps - Seated Long Arc Quad  - 1 x daily - 7 x weekly - 3 sets - 10 reps  ASSESSMENT:  CLINICAL IMPRESSION: 11/04/23: Pt reports positive response to aquatic therapy, endorsing decreased knee and back pain since the session. Her 5 x STS has improved. Discussed fall prevention strategies with pt meeting STG# 4. She is also more mindful of posture and has been using  a lumbar pillow now. STG# 3 met. She reports that she continues to be limited to 10-15 minutes standing/walking.  Continued with HEP with increased reps and resistance tolerated well.    10/30/23: Patient presents to first aquatic PT session reporting continued BIL knee pain. She enters the water apprehensive, stayed with outer edge of wall during session for comfort. She moves with slow, guarded movements throughout session, but reports that the water feels better on her knees and lower back. Session today focused on improving standing activity tolerance and LE strengthening in the aquatic environment for use of buoyancy to offload joints and the viscosity of water as resistance during therapeutic exercise. Patient was able to tolerate all prescribed exercises in the aquatic environment with no adverse effects. Patient continues to benefit from skilled PT services on land and aquatic based and should be progressed as able to improve functional independence.    EVAL- Patient is a 68 y.o. female who was seen today for physical therapy evaluation and treatment for bil chronic knee pain. Pt also reports falling 2 x a month  because of unsteadiness and weakness in knees.  Pt also complains of midline back pain that sometimes radiates into buttocks. Pt often cares for grandkids and feels ill equipped to respond in a quick manner to children's needs due to weakness.  Pt will benefit from skilled PT to address deconditioning, weakness and falls prevention.  OBJECTIVE IMPAIRMENTS: decreased activity tolerance, decreased balance, decreased knowledge of condition, decreased knowledge of use of DME, decreased mobility, difficulty walking, obesity, and pain.   ACTIVITY LIMITATIONS: standing, squatting, sleeping, stairs, bathing, hygiene/grooming, locomotion level, and caring for others  PARTICIPATION LIMITATIONS: meal prep, cleaning, laundry, shopping, community activity, and church  PERSONAL FACTORS:  See pertinent medical history and  sedentary lifestyle  are also affecting patient's functional outcome.   REHAB POTENTIAL: Fair sedentary lifestyle and multiple falls  CLINICAL DECISION MAKING: Evolving/moderate complexity  EVALUATION COMPLEXITY: Moderate   GOALS: Goals reviewed with patient? Yes  SHORT TERM GOALS: Target date: 11-12-23 Pt will be independent with initial HEP Baseline:no knowledge Goal status: ONGOING  2.  Pt will begin to walk 10 minutes without stopping to show increased mobility Baseline: Pt unable to walk for 5 min without bil knee pain 10-28-23 with 3 back stretch breaks  350.2 ft(1587ft to 198 ft Norm) Goal status: ONGOING  3.  Demonstrate understanding of neutral posture and be more conscious of position and posture throughout the day.  Baseline: limited knowledge 11/04/23: has started using lumbar support pillow which has helped Goal status: MET  4. Pt will be educated on fall preparedness and verbalize at least 3 safety techniques to remain living in home independently to reduce falls  Baseline  No knowledge  11/04/23: Has slip proof mat in shower, kitchen, keeps floor clean from  clutter. Discussed more during session   Goal status: MET    LONG TERM GOALS: Target date: 12-08-23  Pt will be independent with advanced HEP.  Baseline: no knowledge Goal status: INITIAL  2.  Pt will be educated on sleeping aids and posture helps to receive 4 or more hours of sleep uninterrupted Baseline: Pt wakes every2 hours because of pain at night Goal status: INITIAL  3.  Pt will be able to report at least 50% decrease in pain with mobilty Baseline: 8-9/10 pain level Goal status: INITIAL  4.  Pt will be able to demonstrate / simulate safe tub transfer in order to take a bath independently Baseline: unable to take a shower or a bath, and sits on side of tub for bathing Goal status: INITIAL  5.  Pt will be able to stand for 25 minutes in order to complete household chore/self care tasks Baseline: eval less than 5 minutes Goal status: INITIAL  6.  Patient will report improved functional level on LEFS >/= 50/80 in order to show improved mobility level Baseline: 12/80 15 % Goal status: INITIAL   PLAN:  PT FREQUENCY: 1-2x/week  PT DURATION: 6 weeks  PLANNED INTERVENTIONS: 97164- PT Re-evaluation, 97110-Therapeutic exercises, 97530- Therapeutic activity, 97112- Neuromuscular re-education, 97535- Self Care, [id number]- Manual therapy, [phone]- Gait training, [phone]- Aquatic Therapy, [phone]- Electrical stimulation (manual), Patient/Family education, Balance training, Stair training, Taping, Dry Needling, Joint mobilization, Spinal mobilization, Cryotherapy, and Moist heat  PLAN FOR NEXT SESSION: HEP and sit to stands, goals , walking tolerance  Royden Purl PTA  11/04/23 10:10 AM

## 2023-11-06 ENCOUNTER — Ambulatory Visit

## 2023-11-06 DIAGNOSIS — M25561 Pain in right knee: Secondary | ICD-10-CM | POA: Diagnosis not present

## 2023-11-06 DIAGNOSIS — M5442 Lumbago with sciatica, left side: Secondary | ICD-10-CM | POA: Diagnosis not present

## 2023-11-06 DIAGNOSIS — R2689 Other abnormalities of gait and mobility: Secondary | ICD-10-CM | POA: Diagnosis not present

## 2023-11-06 DIAGNOSIS — M6281 Muscle weakness (generalized): Secondary | ICD-10-CM

## 2023-11-06 DIAGNOSIS — R262 Difficulty in walking, not elsewhere classified: Secondary | ICD-10-CM

## 2023-11-06 DIAGNOSIS — M25562 Pain in left knee: Secondary | ICD-10-CM | POA: Diagnosis not present

## 2023-11-06 DIAGNOSIS — G8929 Other chronic pain: Secondary | ICD-10-CM | POA: Diagnosis not present

## 2023-11-06 NOTE — Therapy (Signed)
 OUTPATIENT PHYSICAL THERAPY LOWER EXTREMITY TREATMENT   Patient Name: [name] MRN: [id number] DOB:08-07-1955, 69 y.o., female Today's Date: 11/06/2023  END OF SESSION:  PT End of Session - 11/06/23 1223     Visit Number 7    Number of Visits 13    Date for PT Re-Evaluation 11/24/23    Authorization Type [employer] MCR    PT Start Time 1225    PT Stop Time 1305    PT Time Calculation (min) 40 min    Activity Tolerance Patient limited by fatigue;Patient limited by pain    Behavior During Therapy [facility] for tasks assessed/performed             Past Medical History:  Diagnosis Date   Arthritis    Atrial fibrillation (HCC)    Cataract    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 Nelle Don.    Hypercholesteremia    Hypertension    Hypertension    Hypertensive urgency 05/2006   Stroke (cerebrum) (HCC)    TIA (transient ischemic attack) 2017   Past Surgical History:  Procedure Laterality Date   ARTERY BIOPSY Right 09/18/2017   Procedure: BIOPSY OF RIGHT TEMPORAL ARTERY;  Surgeon: Abigail Miyamoto, MD;  Location: [facility] OR;  Service: General;  Laterality: Right;   COLON SURGERY     no per pt   DILATION AND CURETTAGE OF UTERUS  08/04/1974   MENISCUS REPAIR  10/03/2007   right knee   TONSILECTOMY, ADENOIDECTOMY, BILATERAL MYRINGOTOMY AND TUBES  age 85   TONSILLECTOMY     Patient Active Problem List   Diagnosis Date Noted   Poor nutrition 02/18/2023   Physical deconditioning 12/10/2022   Primary osteoarthritis of left knee 10/16/2022   Diabetes mellitus type 2, insulin dependent (HCC) 10/02/2021   Diabetic neuropathy (HCC) 06/21/2018   Chronic midline low back pain 06/21/2018   Mild obstructive sleep apnea 11/18/2013   Constipation  05/28/2011   Atrial fibrillation (HCC) 06/07/2009   Depression 09/29/2008   Hyperlipidemia associated with type 2 diabetes mellitus (HCC) 08/22/2008   CHRONIC KIDNEY DISEASE STAGE II (MILD) 08/01/2008   Essential hypertension 04/16/2007    PCP: Bess Kinds, MD   REFERRING PROVIDER: Persons, [place], [place]   REFERRING DIAG: [phone] (ICD-10-CM) - Pain in both knees, unspecified chronicity   THERAPY DIAG:  Chronic pain of both knees  Muscle weakness (generalized)  Difficulty in walking, not elsewhere classified  Midline low back pain with left-sided sciatica, unspecified chronicity  Rationale for Evaluation and Treatment: Rehabilitation  ONSET DATE: Worsening over the past year   I have had knee pain for over 15 years  SUBJECTIVE:   SUBJECTIVE STATEMENT: Patient reports that her back is hurting a lot today from walking back to the pool area by herself. Her knees are feeling ok today. She states the last land session was difficult and that she was sore for an hour, but didn't have much pain after that.   EVAL-My doctor recommended PT for my arthritis and my weakness. I have been falling about 2 x a month because of my weakness. I have a hard time going up stairs.  My knees are very painful. I can only stand and walk about 5 to 10  minutes. I can't get in bath tub  I dont sleep well only about 2 hours at a time. 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 Yes: NPRS scale: Right  knee 0/10 and at worst 8/10  Left knee 0/10 and at worst 9/10 Pain location: bil knees Pain description: achy sharp like somebody picking and stabbing Aggravating factors: standing more than 5 -10 min, getting up quickly to care for grandkids, stairs I can't take a bath tub  I sit on edge and do a sponge bath,  Relieving factors:  salonpas patches and voltaren cream Back pain- at worst 9/10 and at rest 0/10  achy in mid back PRECAUTIONS: None  RED FLAGS: None   WEIGHT BEARING RESTRICTIONS: No  FALLS:  Has patient fallen in last 6 months? Yes. Number of falls 2 x a month if I get up quickly my knees feel like they " give way"   LIVING ENVIRONMENT: Lives with: lives with their family with dtr Lives in: House/apartment Stairs: Yes: External: 3 steps; can reach both Has following equipment at home: Single point cane and Walker - 4 wheeled  OCCUPATION: retired former Chief Strategy Officer and med tech  PLOF: Independent with household mobility with device and mostly sedentary  PATIENT GOALS: be able to walk with out stumbling and my knees giving out,   NEXT MD VISIT: TBD  OBJECTIVE:  Note: Objective measures were completed at Evaluation unless otherwise noted.  DIAGNOSTIC FINDINGS: see medical record  PATIENT SURVEYS:  LEFS 12/80  15%  COGNITION: Overall cognitive status: Within functional limits for tasks assessed     SENSATION: WFL Diabetic neuropathy  EDEMA:  NT  MUSCLE LENGTH: Hamstrings: Right bil tightness 54   POSTURE: rounded shoulders, forward head, flexed trunk , and obesity  PALPATION: Global tenderness around knee joint, arthritic pain   LUMBAR ROM:   AROM eval  Flexion Fingertips to knees bil  Extension 0  Right lateral flexion Limited 75%  Left lateral flexion Limted 75%  Right rotation Limited 75%  Left rotation Limited 75%   (Blank rows = not tested)   LOWER EXTREMITY ROM:  Active ROM Right eval Left eval  Hip flexion    Hip extension    Hip abduction    Hip adduction    Hip internal rotation    Hip external rotation    Knee flexion 116/ P 125 120/P127  Knee extension -7 -5  Ankle dorsiflexion 5 5  Ankle plantarflexion    Ankle inversion    Ankle eversion     (Blank rows = not tested)  LOWER EXTREMITY MMT:  MMT Right eval Left eval  Hip flexion 4-  4-  Hip extension 3- 3-  Hip abduction 3- 4-  Hip adduction    Hip internal rotation    Hip external rotation    Knee flexion 4- 4  Knee extension 4- 4  Ankle dorsiflexion    Ankle plantarflexion 4/25 3/25  Ankle inversion    Ankle eversion     (Blank rows = not tested)  LOWER EXTREMITY SPECIAL TESTS:  NT  FUNCTIONAL TESTS:  5 times sit to stand: 53.32 sec; 11/04/23: 29.7 sec standard chair hands on thighs.  2 minute walk test: 131.8  Norm for age 3629ft 10-28-23  with 3 back stretch breaks  350.2 ft(1520ft to 198 ft Norm) 10/21/23 BERG Total Score: 42/56  Pt must use AD for balance  GAIT: Distance walked: 131.8 ft Assistive device utilized: Single point cane Level of assistance: Modified independence Comments: Pt with antalgic gait and shortened stride length   [facility] Adult PT Treatment:                                                DATE: 11/06/23 Aquatic therapy at [facility] [address] - therapeutic pool temp approximately 91 degrees. Pt enters building ambulating with SPC. Treatment took place in water 3.8 to  4 ft 8 in. deep depending upon activity.  Pt entered and exited the pool via stair and handrails independently with step to pattern.  Therapeutic Exercise: Aquatic Exercise: Sidestepping hanging onto wall x4 steps x3 laps Step ups on bottom step fwd x10 BIL Thoracic rotation with noodle x10 BIL Cat/cow with noodle x1' Standing with UE support edge of pool: Hip abd/add x10 BIL Hamstring curl x15 BIL Hip extension x10 BIL Squats 2x10 Heel/toe raises 2x10 Standing marching 2x10 BIL Hip ext/flex with knee straight x 10 BIL L stretch on wall 5" hold x5  Pt requires the buoyancy of water for active assisted exercises with buoyancy supported for strengthening and AROM exercises. Hydrostatic pressure also supports joints by unweighting joint load by at least 50 % in 3-4 feet depth water. 80% in chest to neck deep water. Water will provide assistance with  movement using the current and laminar flow while the buoyancy reduces weight bearing. Pt requires the viscosity of the water for resistance with strengthening exercises.  [facility] Adult PT Treatment:                                                DATE: 11/04/23 Therapeutic Exercise: LAQ 3# 10 x 3  Supine March GTB  Supine Clam GTB  Bridge 10 x 2  LTR SLR 10 x 1 each  STS 5 x 2    Self Care: Pt able to verbalize fall prevention: anti slip mats in shower, grab bars, keep clutter cleaned up Further discussed fall prevention including, night lights, removal of throw rugs, no electrical cords in pathways.    [facility] Adult PT Treatment:                                                DATE: 10/30/23 Aquatic therapy at [facility] [address] - therapeutic pool temp approximately 91 degrees. Pt enters building ambulating with SPC. Treatment took place in water 3.8 to  4 ft 8 in. deep depending upon activity.  Pt entered and exited the pool via stair and handrails independently with step to pattern, apprehensive on last step. Patient entered water for aquatic therapy for first time and was introduced to principles and therapeutic effects of water as they ambulated and acclimated to pool.  Therapeutic Exercise: Aquatic Exercise: Sidestepping hanging onto wall x4 steps x3 laps Step ups on bottom step fwd x10 BIL Standing with UE support edge of pool: Hip abd/add x10 BIL Hamstring curl x10 BIL Hip  extension x10 BIL Squats 2x10 Heel/toe raises 2x10 Standing marching x10 BIL Hip ext/flex with knee straight x 10 BIL L stretch on wall 10" hold x3 Hip Circles CW x10 BIL  Pt requires the buoyancy of water for active assisted exercises with buoyancy supported for strengthening and AROM exercises. Hydrostatic pressure also supports joints by unweighting joint load by at least 50 % in 3-4 feet depth water. 80% in chest to neck deep water. Water will provide assistance with movement using the current  and laminar flow while the buoyancy reduces weight bearing. Pt requires the viscosity of the water for resistance with strengthening exercises.    PATIENT EDUCATION:  Education details: POC, explanation of findings, issue HEP Person educated: Patient Education method: Explanation, Demonstration, Tactile cues, Verbal cues, and Handouts Education comprehension: verbalized understanding, returned demonstration, verbal cues required, tactile cues required, and needs further education  HOME EXERCISE PROGRAM: Access Code: [email or web address] URL: [email or web address] Date: 10/13/2023 Prepared by: Garen Lah  Exercises - Supine Lower Trunk Rotation  - 1 x daily - 7 x weekly - 1 sets - 5 reps - 20 sec  hold - Supine Active Straight Leg Raise  - 1 x daily - 7 x weekly - 3 sets - 10 reps - Seated Heel Toe Raises  - 1 x daily - 7 x weekly - 3 sets - 10 reps - Sit to stand with sink support Movement snack  - 1 x daily - 7 x weekly - 3 sets - 5-10 reps - Seated Long Arc Quad  - 1 x daily - 7 x weekly - 3 sets - 10 reps  ASSESSMENT:  CLINICAL IMPRESSION: Patient presents to aquatic PT session reporting increased lower back pain from ambulating back to pool area by herself today. Session today focused on improving standing activity tolerance, LE strengthening, and lumbar mobility in the aquatic environment for use of buoyancy to offload joints and the viscosity of water as resistance during therapeutic exercise. Patient was able to tolerate all prescribed exercises in the aquatic environment with no adverse effects. Patient continues to benefit from skilled PT services on land and aquatic based and should be progressed as able to improve functional independence.   11/04/23: Pt reports positive response to aquatic therapy, endorsing decreased knee and back pain since the session. Her 5 x STS has improved. Discussed fall prevention strategies with pt meeting STG# 4. She is also more mindful of  posture and has been using  a lumbar pillow now. STG# 3 met. She reports that she continues to be limited to 10-15 minutes standing/walking.  Continued with HEP with increased reps and resistance tolerated well.    EVAL- Patient is a 68 y.o. female who was seen today for physical therapy evaluation and treatment for bil chronic knee pain. Pt also reports falling 2 x a month because of unsteadiness and weakness in knees.  Pt also complains of midline back pain that sometimes radiates into buttocks. Pt often cares for grandkids and feels ill equipped to respond in a quick manner to children's needs due to weakness.  Pt will benefit from skilled PT to address deconditioning, weakness and falls prevention.  OBJECTIVE IMPAIRMENTS: decreased activity tolerance, decreased balance, decreased knowledge of condition, decreased knowledge of use of DME, decreased mobility, difficulty walking, obesity, and pain.   ACTIVITY LIMITATIONS: standing, squatting, sleeping, stairs, bathing, hygiene/grooming, locomotion level, and caring for others  PARTICIPATION LIMITATIONS: meal prep, cleaning, laundry, shopping, community activity, and church  PERSONAL FACTORS:  See pertinent  medical history and sedentary lifestyle  are also affecting patient's functional outcome.   REHAB POTENTIAL: Fair sedentary lifestyle and multiple falls  CLINICAL DECISION MAKING: Evolving/moderate complexity  EVALUATION COMPLEXITY: Moderate   GOALS: Goals reviewed with patient? Yes  SHORT TERM GOALS: Target date: 11-12-23 Pt will be independent with initial HEP Baseline:no knowledge Goal status: ONGOING  2.  Pt will begin to walk 10 minutes without stopping to show increased mobility Baseline: Pt unable to walk for 5 min without bil knee pain 10-28-23 with 3 back stretch breaks  350.2 ft(1553ft to 198 ft Norm) Goal status: ONGOING  3.  Demonstrate understanding of neutral posture and be more conscious of position and posture  throughout the day.  Baseline: limited knowledge 11/04/23: has started using lumbar support pillow which has helped Goal status: MET  4. Pt will be educated on fall preparedness and verbalize at least 3 safety techniques to remain living in home independently to reduce falls  Baseline  No knowledge  11/04/23: Has slip proof mat in shower, kitchen, keeps floor clean from clutter. Discussed more during session   Goal status: MET    LONG TERM GOALS: Target date: 12-08-23  Pt will be independent with advanced HEP.  Baseline: no knowledge Goal status: INITIAL  2.  Pt will be educated on sleeping aids and posture helps to receive 4 or more hours of sleep uninterrupted Baseline: Pt wakes every2 hours because of pain at night Goal status: INITIAL  3.  Pt will be able to report at least 50% decrease in pain with mobilty Baseline: 8-9/10 pain level Goal status: INITIAL  4.  Pt will be able to demonstrate / simulate safe tub transfer in order to take a bath independently Baseline: unable to take a shower or a bath, and sits on side of tub for bathing Goal status: INITIAL  5.  Pt will be able to stand for 25 minutes in order to complete household chore/self care tasks Baseline: eval less than 5 minutes Goal status: INITIAL  6.  Patient will report improved functional level on LEFS >/= 50/80 in order to show improved mobility level Baseline: 12/80 15 % Goal status: INITIAL   PLAN:  PT FREQUENCY: 1-2x/week  PT DURATION: 6 weeks  PLANNED INTERVENTIONS: 97164- PT Re-evaluation, 97110-Therapeutic exercises, 97530- Therapeutic activity, 97112- Neuromuscular re-education, 97535- Self Care, 82956- Manual therapy, [phone]- Gait training, [phone]- Aquatic Therapy, [phone]- Electrical stimulation (manual), Patient/Family education, Balance training, Stair training, Taping, Dry Needling, Joint mobilization, Spinal mobilization, Cryotherapy, and Moist heat  PLAN FOR NEXT SESSION: HEP and sit to stands,  goals , walking tolerance  Berta Minor PTA  11/06/23 1:07 PM

## 2023-11-10 ENCOUNTER — Ambulatory Visit: Admitting: Physical Therapy

## 2023-11-10 DIAGNOSIS — R262 Difficulty in walking, not elsewhere classified: Secondary | ICD-10-CM

## 2023-11-10 DIAGNOSIS — R2689 Other abnormalities of gait and mobility: Secondary | ICD-10-CM

## 2023-11-10 DIAGNOSIS — M25562 Pain in left knee: Secondary | ICD-10-CM | POA: Diagnosis not present

## 2023-11-10 DIAGNOSIS — M25561 Pain in right knee: Secondary | ICD-10-CM | POA: Diagnosis not present

## 2023-11-10 DIAGNOSIS — M6281 Muscle weakness (generalized): Secondary | ICD-10-CM | POA: Diagnosis not present

## 2023-11-10 DIAGNOSIS — G8929 Other chronic pain: Secondary | ICD-10-CM

## 2023-11-10 DIAGNOSIS — M5442 Lumbago with sciatica, left side: Secondary | ICD-10-CM | POA: Diagnosis not present

## 2023-11-10 NOTE — Therapy (Signed)
 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 Nelle Don.    Hypercholesteremia    Hypertension    Hypertension    Hypertensive urgency 05/2006   Stroke (cerebrum) (HCC)    TIA (transient ischemic attack) 2017   Past Surgical History:  Procedure Laterality Date   ARTERY BIOPSY Right 09/18/2017   Procedure: BIOPSY OF RIGHT TEMPORAL ARTERY;  Surgeon: Abigail Miyamoto, MD;  Location: [facility] OR;  Service: General;  Laterality: Right;   COLON SURGERY     no per pt   DILATION AND CURETTAGE OF UTERUS  08/04/1974   MENISCUS REPAIR  10/03/2007   right knee   TONSILECTOMY, ADENOIDECTOMY, BILATERAL MYRINGOTOMY AND TUBES  age 89   TONSILLECTOMY     Patient Active Problem List   Diagnosis Date Noted   Poor nutrition 02/18/2023   Physical deconditioning 12/10/2022   Primary osteoarthritis of left knee 10/16/2022   Diabetes mellitus type 2, insulin dependent (HCC) 10/02/2021   Diabetic neuropathy (HCC) 06/21/2018   Chronic midline low back pain 06/21/2018   Mild obstructive sleep apnea 11/18/2013   Constipation 05/28/2011   Atrial fibrillation (HCC) 06/07/2009   Depression 09/29/2008   Hyperlipidemia associated with type 2 diabetes mellitus  (HCC) 08/22/2008   CHRONIC KIDNEY DISEASE STAGE II (MILD) 08/01/2008   Essential hypertension 04/16/2007    PCP: Bess Kinds, MD   REFERRING PROVIDER: Persons, [place], [place]   REFERRING DIAG: [phone] (ICD-10-CM) - Pain in both knees, unspecified chronicity   THERAPY DIAG:  Chronic pain of both knees  Difficulty in walking, not elsewhere classified  Muscle weakness (generalized)  Midline low back pain with left-sided sciatica, unspecified chronicity  Other abnormalities of gait and mobility  Rationale for Evaluation and Treatment: Rehabilitation  ONSET DATE: Worsening over the past year   I have had knee pain for over 15 years  SUBJECTIVE:   SUBJECTIVE STATEMENT: Patient reports no pain in back and bil knees at beginning of session but is fatigued at end of session   EVAL-My doctor recommended PT for my arthritis and my weakness. I have been falling about 2 x a month because of my weakness. I have a hard time going up stairs.  My knees are very painful. I can only stand and walk about 5 to 10 minutes. I can't get in bath tub  I dont sleep well only about 2 hours at a time. 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 Yes: NPRS scale: Right  knee 0/10 and at worst 8/10  Left knee 0/10 and at worst 9/10 Pain location: bil knees Pain description: achy sharp like somebody picking and stabbing Aggravating factors: standing more than 5 -10 min, getting up quickly to care for grandkids, stairs I can't take a bath tub  I sit on edge and do a sponge bath,  Relieving factors: salonpas patches and voltaren cream Back pain- at worst 9/10 and at rest 0/10  achy in mid back PRECAUTIONS: None  RED FLAGS: None   WEIGHT BEARING RESTRICTIONS: No  FALLS:  Has patient fallen in last 6 months? Yes. Number of falls 2 x  a month if I get up quickly my knees feel like they " give way"   LIVING ENVIRONMENT: Lives with: lives with their family with dtr Lives in: House/apartment Stairs: Yes: External: 3 steps; can reach both Has following equipment at home: Single point cane and Walker - 4 wheeled  OCCUPATION: retired former Chief Strategy Officer and med tech  PLOF: Independent with household mobility with device and mostly sedentary  PATIENT GOALS: be able to walk with out stumbling and my knees giving out,   NEXT MD VISIT: TBD  OBJECTIVE:  Note: Objective measures were completed at Evaluation unless otherwise noted.  DIAGNOSTIC FINDINGS: see medical record  PATIENT SURVEYS:  LEFS 12/80  15%  COGNITION: Overall cognitive status: Within functional limits for tasks assessed     SENSATION: WFL Diabetic neuropathy  EDEMA:  NT  MUSCLE LENGTH: Hamstrings: Right bil tightness 54   POSTURE: rounded shoulders, forward head, flexed trunk , and obesity  PALPATION: Global tenderness around knee joint, arthritic pain   LUMBAR ROM:   AROM eval 11-10-23  Flexion Fingertips to knees bil Fingertips to ankles  Extension 0 25 % with pain  Right lateral flexion Limited 75% 50%  Left lateral flexion Limted 75% 50%  Right rotation Limited 75% 50%  Left rotation Limited 75% 50%   (Blank rows = not tested)   LOWER EXTREMITY ROM:  Active ROM Right eval Left eval  Hip flexion    Hip extension    Hip abduction    Hip adduction    Hip internal rotation    Hip external rotation    Knee flexion 116/ P 125 120/P127  Knee extension -7 -5  Ankle dorsiflexion 5 5  Ankle plantarflexion    Ankle inversion    Ankle eversion     (Blank rows = not tested)  LOWER EXTREMITY MMT:  MMT Right eval Left eval  Hip flexion 4- 4-  Hip extension 3- 3-  Hip abduction 3- 4-  Hip adduction    Hip internal rotation    Hip external rotation    Knee flexion 4- 4  Knee extension 4- 4  Ankle dorsiflexion     Ankle plantarflexion 4/25 3/25  Ankle inversion    Ankle eversion     (Blank rows = not tested)  LOWER EXTREMITY SPECIAL TESTS:  NT  FUNCTIONAL TESTS:  5 times sit to stand: 53.32 sec; 11/04/23: 29.7 sec standard chair hands on thighs.  2 minute walk test: 131.8  Norm for age 4259ft 10-28-23  with 3 back stretch breaks  350.2 ft(1544ft to 198 ft Norm) 5 x STS 11-10-23  25.88 sec 10/21/23 BERG Total Score: 42/56  Pt must use AD for balance  GAIT: Distance walked: 131.8  ft Assistive device utilized: Single point cane Level of assistance: Modified independence Comments: Pt with antalgic gait and shortened stride length   [facility] Adult PT Treatment:                                                DATE: 11-10-23 5xSTS 25.88 sec Therapeutic Exercise: LAQ 5# 2 x 10 Supine Clam GTB  Bridge 10 x 2  SLR 2 x 10  Manual bil STW of Quadratus lumborum with added stretch  Overstretch for hip ER and IR and piriformis PROM  Therapeutic Activity: STS with 10 # DB 2x 10 Step ups  1x 10 on R and the on Left with close supervision Standing bil heel raises x 10 Standing hip ext bil holding onto counter x 10 Standing hip abd bil holding onto counter x 10  [facility] Adult PT Treatment:                                                DATE: 11/06/23 Aquatic therapy at [facility]- [address] - therapeutic pool temp approximately 91 degrees. Pt enters building ambulating with SPC. Treatment took place in water 3.8 to  4 ft 8 in. deep depending upon activity.  Pt entered and exited the pool via stair and handrails independently with step to pattern.  Therapeutic Exercise: Aquatic Exercise: Sidestepping hanging onto wall x4 steps x3 laps Step ups on bottom step fwd x10 BIL Thoracic rotation with noodle x10 BIL Cat/cow with noodle x1' Standing with UE support edge of pool: Hip abd/add x10 BIL Hamstring curl x15 BIL Hip extension x10 BIL Squats 2x10 Heel/toe raises 2x10 Standing marching 2x10  BIL Hip ext/flex with knee straight x 10 BIL L stretch on wall 5" hold x5  Pt requires the buoyancy of water for active assisted exercises with buoyancy supported for strengthening and AROM exercises. Hydrostatic pressure also supports joints by unweighting joint load by at least 50 % in 3-4 feet depth water. 80% in chest to neck deep water. Water will provide assistance with movement using the current and laminar flow while the buoyancy reduces weight bearing. Pt requires the viscosity of the water for resistance with strengthening exercises.  [facility] Adult PT Treatment:                                                DATE: 11/04/23 Therapeutic Exercise: LAQ 3# 10 x 3  Supine March GTB  Supine Clam GTB  Bridge 10 x 2  LTR SLR 10 x 1 each  STS 5 x 2    Self Care: Pt able to verbalize fall prevention: anti slip mats in shower, grab bars, keep clutter cleaned up Further discussed fall prevention including, night lights, removal of throw rugs, no electrical cords in pathways.     [facility] Adult PT Treatment:  DATE: 10/30/23 Aquatic therapy at [facility]- [address] - therapeutic pool temp approximately 91 degrees. Pt enters building ambulating with SPC. Treatment took place in water 3.8 to  4 ft 8 in. deep depending upon activity.  Pt entered and exited the pool via stair and handrails independently with step to pattern, apprehensive on last step. Patient entered water for aquatic therapy for first time and was introduced to principles and therapeutic effects of water as they ambulated and acclimated to pool.  Therapeutic Exercise: Aquatic Exercise: Sidestepping hanging onto wall x4 steps x3 laps Step ups on bottom step fwd x10 BIL Standing with UE support edge of pool: Hip abd/add x10 BIL Hamstring curl x10 BIL Hip extension x10 BIL Squats 2x10 Heel/toe raises 2x10 Standing marching x10 BIL Hip ext/flex with knee straight x 10 BIL L  stretch on wall 10" hold x3 Hip Circles CW x10 BIL  Pt requires the buoyancy of water for active assisted exercises with buoyancy supported for strengthening and AROM exercises. Hydrostatic pressure also supports joints by unweighting joint load by at least 50 % in 3-4 feet depth water. 80% in chest to neck deep water. Water will provide assistance with movement using the current and laminar flow while the buoyancy reduces weight bearing. Pt requires the viscosity of the water for resistance with strengthening exercises.    PATIENT EDUCATION:  Education details: POC, explanation of findings, issue HEP Person educated: Patient Education method: Explanation, Demonstration, Tactile cues, Verbal cues, and Handouts Education comprehension: verbalized understanding, returned demonstration, verbal cues required, tactile cues required, and needs further education  HOME EXERCISE PROGRAM: Access Code: [email or web address]  updated 11-10-23 URL: [email or web address] Date: 11/10/2023 Prepared by: Garen Lah  Exercises - Supine Lower Trunk Rotation  - 1 x daily - 7 x weekly - 1 sets - 5 reps - 20 sec  hold - Supine Active Straight Leg Raise  - 1 x daily - 7 x weekly - 3 sets - 10 reps - Sit to stand with sink support Movement snack  - 1 x daily - 7 x weekly - 3 sets - 5-10 reps - Seated Long Arc Quad  - 1 x daily - 7 x weekly - 3 sets - 10 reps - Standing Hip Abduction with Counter Support  - 1 x daily - 7 x weekly - 3 sets - 10 reps - Standing Hip Extension with Counter Support  - 1 x daily - 7 x weekly - 3 sets - 10 reps - Heel raise with counter support and towel under toes  - 1 x daily - 7 x weekly - 3 sets - 10 reps - Clam with Resistance  - 1 x daily - 7 x weekly - 3 sets - 10 reps  ASSESSMENT:  CLINICAL IMPRESSION: Pt enjoyed aquatics and presents today without pain in back and knees at initiation of RX session.  Pt with 5 x STS improved to 25.88 sec today.  Pt HEP updated for more  closed chain exercise.  Emphasis on standing and performing pelvic tilt for control of back pain with added Closed chain exercises.  Pt leave with no adverse reactions and all questions answered from PT perspective.  All STG achieved except for walking goal.  Working on core strength and standing tolerance for completion of LTG before POC ends.  11/04/23: Pt reports positive response to aquatic therapy, endorsing decreased knee and back pain since the session. Her 5 x STS has improved. Discussed fall prevention strategies with pt meeting STG#  4. She is also more mindful of posture and has been using  a lumbar pillow now. STG# 3 met. She reports that she continues to be limited to 10-15 minutes standing/walking.  Continued with HEP with increased reps and resistance tolerated well.    EVAL- Patient is a 68 y.o. female who was seen today for physical therapy evaluation and treatment for bil chronic knee pain. Pt also reports falling 2 x a month because of unsteadiness and weakness in knees.  Pt also complains of midline back pain that sometimes radiates into buttocks. Pt often cares for grandkids and feels ill equipped to respond in a quick manner to children's needs due to weakness.  Pt will benefit from skilled PT to address deconditioning, weakness and falls prevention.  OBJECTIVE IMPAIRMENTS: decreased activity tolerance, decreased balance, decreased knowledge of condition, decreased knowledge of use of DME, decreased mobility, difficulty walking, obesity, and pain.   ACTIVITY LIMITATIONS: standing, squatting, sleeping, stairs, bathing, hygiene/grooming, locomotion level, and caring for others  PARTICIPATION LIMITATIONS: meal prep, cleaning, laundry, shopping, community activity, and church  PERSONAL FACTORS:  See pertinent medical history and sedentary lifestyle  are also affecting patient's functional outcome.   REHAB POTENTIAL: Fair sedentary lifestyle and multiple falls  CLINICAL DECISION  MAKING: Evolving/moderate complexity  EVALUATION COMPLEXITY: Moderate   GOALS: Goals reviewed with patient? Yes  SHORT TERM GOALS: Target date: 11-12-23 Pt will be independent with initial HEP Baseline:no knowledge Goal status: MET  2.  Pt will begin to walk 10 minutes without stopping to show increased mobility Baseline: Pt unable to walk for 5 min without bil knee pain 10-28-23 with 3 back stretch breaks  350.2 ft(1536ft to 198 ft Norm) Goal status: ONGOING  3.  Demonstrate understanding of neutral posture and be more conscious of position and posture throughout the day.  Baseline: limited knowledge 11/04/23: has started using lumbar support pillow which has helped Goal status: MET  4. Pt will be educated on fall preparedness and verbalize at least 3 safety techniques to remain living in home independently to reduce falls  Baseline  No knowledge  11/04/23: Has slip proof mat in shower, kitchen, keeps floor clean from clutter. Discussed more during session   Goal status: MET    LONG TERM GOALS: Target date: 12-08-23  Pt will be independent with advanced HEP.  Baseline: no knowledge Goal status: ONGOING  2.  Pt will be educated on sleeping aids and posture helps to receive 4 or more hours of sleep uninterrupted Baseline: Pt wakes every2 hours because of pain at night Goal status: ONGOING  3.  Pt will be able to report at least 50% decrease in pain with mobilty Baseline: 8-9/10 pain level Goal status: ONGOING  4.  Pt will be able to demonstrate / simulate safe tub transfer in order to take a bath independently Baseline: unable to take a shower or a bath, and sits on side of tub for bathing Goal status: ONGOING  5.  Pt will be able to stand for 25 minutes in order to complete household chore/self care tasks Baseline: eval less than 5 minutes Goal status: ONGOING  6.  Patient will report improved functional level on LEFS >/= 50/80 in order to show improved mobility  level Baseline: 12/80 15 % Goal status: ONGOING   PLAN:  PT FREQUENCY: 1-2x/week  PT DURATION: 6 weeks  PLANNED INTERVENTIONS: 97164- PT Re-evaluation, 97110-Therapeutic exercises, 97530- Therapeutic activity, 97112- Neuromuscular re-education, 97535- Self Care, 16109- Manual therapy, [id number]- Gait training,  86578- Aquatic Therapy, [id number]- Electrical stimulation (manual), Patient/Family education, Balance training, Stair training, Taping, Dry Needling, Joint mobilization, Spinal mobilization, Cryotherapy, and Moist heat  PLAN FOR NEXT SESSION: HEP and sit to stands, goals , walking tolerance  Garen Lah, PT, [facility] Certified Exercise Expert for the Aging Adult  11/10/23 9:50 AM Phone: [phone] Fax: [phone]

## 2023-11-11 ENCOUNTER — Encounter: Payer: Self-pay | Admitting: Student

## 2023-11-11 ENCOUNTER — Other Ambulatory Visit: Payer: Self-pay | Admitting: Student

## 2023-11-11 ENCOUNTER — Ambulatory Visit: Admitting: Student

## 2023-11-11 VITALS — BP 110/66 | HR 82 | Ht 62.0 in | Wt 214.4 lb

## 2023-11-11 DIAGNOSIS — Z794 Long term (current) use of insulin: Secondary | ICD-10-CM

## 2023-11-11 DIAGNOSIS — E119 Type 2 diabetes mellitus without complications: Secondary | ICD-10-CM

## 2023-11-11 MED ORDER — SEMAGLUTIDE (1 MG/DOSE) 4 MG/3ML ~~LOC~~ SOPN: 1.0000 mg | PEN_INJECTOR | SUBCUTANEOUS | 2 refills | Status: DC

## 2023-11-11 NOTE — Progress Notes (Signed)
 SUBJECTIVE:   CHIEF COMPLAINT / HPI:   Diabetes Meds: Lantus 25 units daily, Humalog 25 units with largest meal, Ozempic 2 mg weekly, Jardiance 25 mg daily  Today: CGM shows she is 99% in range, no low sugars. Estimated A1c ~7 Lantus: 33 units, not taking the humalog because she's not eating a lot. Ozempic 2 mg weekly, jardiance 25 mg daily.   PERTINENT  PMH / PSH:   OBJECTIVE:  BP 110/66   Pulse 82   Ht 5\' 2"  (1.575 m)   Wt 214 lb 6.4 oz (97.3 kg)   SpO2 100%   BMI 39.21 kg/m  Physical Exam Constitutional:      Appearance: Normal appearance.  Cardiovascular:     Rate and Rhythm: Normal rate and regular rhythm.     Pulses: Normal pulses.     Heart sounds: Normal heart sounds. No murmur heard.    No friction rub. No gallop.  Pulmonary:     Effort: Pulmonary effort is normal. No respiratory distress.     Breath sounds: Normal breath sounds. No stridor. No wheezing, rhonchi or rales.  Abdominal:     General: There is no distension.     Palpations: Abdomen is soft. There is no mass.     Tenderness: There is no abdominal tenderness. There is no guarding or rebound.     Hernia: No hernia is present.  Neurological:     Mental Status: She is alert.  Psychiatric:        Mood and Affect: Mood normal.        Behavior: Behavior normal.      ASSESSMENT/PLAN:   Assessment & Plan Diabetes mellitus type 2, insulin dependent (HCC) Patient comes in for follow-up of her diabetes.  Patient reports good compliance with medications.  Patient CGM showing patient's sugars are 99% of time in range, no low sugars.  Estimated A1c around less than 7.  Suspect we are at goal, however patient notes she does not eat more than 1 meal a day, likely secondary to her Ozempic use.  Will back off of Ozempic, to encourage appetite, so patient does not lose muscle mass.  Will have patient follow-up 1 month. - Lantus 33 units daily - Humalog 25 units with largest meal - Ozempic 1 mg weekly - Jardiance  25 mg daily - Follow-up 62-month No follow-ups on file. Bess Kinds, MD 11/11/2023, 10:37 AM PGY-3, [facility]

## 2023-11-11 NOTE — Patient Instructions (Signed)
 It was great to see you! Thank you for allowing me to participate in your care!  I recommend that you always bring your medications to each appointment as this makes it easy to ensure we are on the correct medications and helps [place] not miss when refills are needed.  Our plans for today:  - Diabetes  Lantus 33 units daily Humalog 25 units with largest meal Ozempic 1 mg weekly Jardiance 25 mg daily Follow up in 1 month    Take care and seek immediate care sooner if you develop any concerns.   Dr. Bess Kinds, MD [facility]

## 2023-11-11 NOTE — Assessment & Plan Note (Addendum)
 Patient comes in for follow-up of her diabetes.  Patient reports good compliance with medications.  Patient CGM showing patient's sugars are 99% of time in range, no low sugars.  Estimated A1c around less than 7.  Suspect we are at goal, however patient notes she does not eat more than 1 meal a day, likely secondary to her Ozempic use.  Will back off of Ozempic, to encourage appetite, so patient does not lose muscle mass.  Will have patient follow-up 1 month. - Lantus 33 units daily - Humalog 25 units with largest meal - Ozempic 1 mg weekly - Jardiance 25 mg daily - Follow-up 33-month

## 2023-11-13 ENCOUNTER — Ambulatory Visit

## 2023-11-13 DIAGNOSIS — R2689 Other abnormalities of gait and mobility: Secondary | ICD-10-CM | POA: Diagnosis not present

## 2023-11-13 DIAGNOSIS — M5442 Lumbago with sciatica, left side: Secondary | ICD-10-CM

## 2023-11-13 DIAGNOSIS — R262 Difficulty in walking, not elsewhere classified: Secondary | ICD-10-CM | POA: Diagnosis not present

## 2023-11-13 DIAGNOSIS — G8929 Other chronic pain: Secondary | ICD-10-CM | POA: Diagnosis not present

## 2023-11-13 DIAGNOSIS — M6281 Muscle weakness (generalized): Secondary | ICD-10-CM

## 2023-11-13 DIAGNOSIS — M25561 Pain in right knee: Secondary | ICD-10-CM | POA: Diagnosis not present

## 2023-11-13 DIAGNOSIS — M25562 Pain in left knee: Secondary | ICD-10-CM | POA: Diagnosis not present

## 2023-11-13 NOTE — Therapy (Signed)
 OUTPATIENT PHYSICAL THERAPY LOWER EXTREMITY TREATMENT   Patient Name: [name] MRN: [id number] DOB:[birth year], 68 y.o., female Today's Date: 11/13/2023  END OF SESSION:  PT End of Session - 11/13/23 0910     Visit Number 8    Number of Visits 13    Date for PT Re-Evaluation 11/24/23    Authorization Type [employer] MCR    PT Start Time 0915    PT Stop Time 0953    PT Time Calculation (min) 38 min    Activity Tolerance No increased pain;Patient tolerated treatment well    Behavior During Therapy [facility] for tasks assessed/performed             Past Medical History:  Diagnosis Date   Arthritis    Atrial fibrillation (HCC)    Cataract    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 Nelle Don.    Hypercholesteremia    Hypertension    Hypertension    Hypertensive urgency 05/2006   Stroke (cerebrum) (HCC)    TIA (transient ischemic attack) 2017   Past Surgical History:  Procedure Laterality Date   ARTERY BIOPSY Right 09/18/2017   Procedure: BIOPSY OF RIGHT TEMPORAL ARTERY;  Surgeon: Abigail Miyamoto, MD;  Location: [facility] OR;  Service: General;  Laterality: Right;   COLON SURGERY     no per pt   DILATION AND CURETTAGE OF UTERUS  08/04/1974   MENISCUS REPAIR  10/03/2007   right knee   TONSILECTOMY, ADENOIDECTOMY, BILATERAL MYRINGOTOMY AND TUBES  age 1   TONSILLECTOMY     Patient Active Problem List   Diagnosis Date Noted   Poor nutrition 02/18/2023   Physical deconditioning 12/10/2022   Primary osteoarthritis of left knee 10/16/2022   Diabetes mellitus type 2, insulin dependent (HCC) 10/02/2021   Diabetic neuropathy (HCC) 06/21/2018   Chronic midline low back pain 06/21/2018   Mild obstructive sleep apnea 11/18/2013   Constipation  05/28/2011   Atrial fibrillation (HCC) 06/07/2009   Depression 09/29/2008   Hyperlipidemia associated with type 2 diabetes mellitus (HCC) 08/22/2008   CHRONIC KIDNEY DISEASE STAGE II (MILD) 08/01/2008   Essential hypertension 04/16/2007    PCP: Bess Kinds, MD   REFERRING PROVIDER: Persons, [place], [place]   REFERRING DIAG: [phone] (ICD-10-CM) - Pain in both knees, unspecified chronicity   THERAPY DIAG:  Chronic pain of both knees  Difficulty in walking, not elsewhere classified  Muscle weakness (generalized)  Midline low back pain with left-sided sciatica, unspecified chronicity  Rationale for Evaluation and Treatment: Rehabilitation  ONSET DATE: Worsening over the past year   I have had knee pain for over 15 years  SUBJECTIVE:   SUBJECTIVE STATEMENT: Patient reports that her knees are a little sore this morning, attributes to the rainy weather. She reports she was able to walk around the grocery store the other day without needing to stop for a rest break.  EVAL-My doctor recommended PT for my arthritis and my weakness. I have been falling about 2 x a month because of my weakness. I have a hard time going up stairs.  My knees are very painful. I can only stand and walk about 5 to 10 minutes. I can't get in bath tub  I dont sleep well  only about 2 hours at a time. 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 Yes: NPRS scale: Right  knee 0/10 and at worst 8/10  Left knee 0/10 and at worst 9/10 Pain location: bil knees Pain description: achy sharp like somebody picking and stabbing Aggravating factors: standing more than 5 -10 min, getting up quickly to care for grandkids, stairs I can't take a bath tub  I sit on edge and do a sponge bath,  Relieving factors: salonpas patches and voltaren cream Back pain- at  worst 9/10 and at rest 0/10  achy in mid back PRECAUTIONS: None  RED FLAGS: None   WEIGHT BEARING RESTRICTIONS: No  FALLS:  Has patient fallen in last 6 months? Yes. Number of falls 2 x a month if I get up quickly my knees feel like they " give way"   LIVING ENVIRONMENT: Lives with: lives with their family with dtr Lives in: House/apartment Stairs: Yes: External: 3 steps; can reach both Has following equipment at home: Single point cane and Walker - 4 wheeled  OCCUPATION: retired former Chief Strategy Officer and med tech  PLOF: Independent with household mobility with device and mostly sedentary  PATIENT GOALS: be able to walk with out stumbling and my knees giving out,   NEXT MD VISIT: TBD  OBJECTIVE:  Note: Objective measures were completed at Evaluation unless otherwise noted.  DIAGNOSTIC FINDINGS: see medical record  PATIENT SURVEYS:  LEFS 12/80  15%  COGNITION: Overall cognitive status: Within functional limits for tasks assessed     SENSATION: WFL Diabetic neuropathy  EDEMA:  NT  MUSCLE LENGTH: Hamstrings: Right bil tightness 54   POSTURE: rounded shoulders, forward head, flexed trunk , and obesity  PALPATION: Global tenderness around knee joint, arthritic pain   LUMBAR ROM:   AROM eval 11-10-23  Flexion Fingertips to knees bil Fingertips to ankles  Extension 0 25 % with pain  Right lateral flexion Limited 75% 50%  Left lateral flexion Limted 75% 50%  Right rotation Limited 75% 50%  Left rotation Limited 75% 50%   (Blank rows = not tested)   LOWER EXTREMITY ROM:  Active ROM Right eval Left eval  Hip flexion    Hip extension    Hip abduction    Hip adduction    Hip internal rotation    Hip external rotation    Knee flexion 116/ P 125 120/P127  Knee extension -7 -5  Ankle dorsiflexion 5 5  Ankle plantarflexion    Ankle inversion    Ankle eversion     (Blank rows = not tested)  LOWER EXTREMITY MMT:  MMT Right eval Left eval  Hip  flexion 4- 4-  Hip extension 3- 3-  Hip abduction 3- 4-  Hip adduction    Hip internal rotation    Hip external rotation    Knee flexion 4- 4  Knee extension 4- 4  Ankle dorsiflexion    Ankle plantarflexion 4/25 3/25  Ankle inversion    Ankle eversion     (Blank rows = not tested)  LOWER EXTREMITY SPECIAL TESTS:  NT  FUNCTIONAL TESTS:  5 times sit to stand: 53.32 sec; 11/04/23: 29.7 sec standard chair hands on thighs.  2 minute walk test: 131.8  Norm for age 6260ft 10-28-23  with 3 back stretch breaks  350.2 ft(1564ft to 198 ft Norm) 5 x STS 11-10-23  25.88 sec 10/21/23 BERG Total Score: 42/56  Pt must use AD for balance  GAIT: Distance walked: 131.8 ft Assistive device utilized: Single point cane Level of assistance: Modified independence Comments: Pt with antalgic gait and shortened stride length  [facility] Adult PT Treatment:                                                DATE: 11/13/23 Therapeutic Exercise: LAQ 5# 2 x 10 Supine Clam GTB 2x15 Bridge 10 x 2  SLR x 10 Therapeutic Activity: STS arms crossed Step ups 4" x10 BIL - CGA Standing bil heel raises x 10 Standing hip ext bil holding onto counter x 10 Standing hip abd bil holding onto counter x 10   [facility] Adult PT Treatment:                                                DATE: 11-10-23 5xSTS 25.88 sec Therapeutic Exercise: LAQ 5# 2 x 10 Supine Clam GTB  Bridge 10 x 2  SLR 2 x 10  Manual bil STW of Quadratus lumborum with added stretch  Overstretch for hip ER and IR and piriformis PROM  Therapeutic Activity: STS with 10 # DB 2x 10 Step ups  1x 10 on R and the on Left with close supervision Standing bil heel raises x 10 Standing hip ext bil holding onto counter x 10 Standing hip abd bil holding onto counter x 10  [facility] Adult PT Treatment:                                                DATE: 11/06/23 Aquatic therapy at [facility]- [address] - therapeutic pool temp approximately 91 degrees. Pt enters  building ambulating with SPC. Treatment took place in water 3.8 to  4 ft 8 in. deep depending upon activity.  Pt entered and exited the pool via stair and handrails independently with step to pattern.  Therapeutic Exercise: Aquatic Exercise: Sidestepping hanging onto wall x4 steps x3 laps Step ups on bottom step fwd x10 BIL Thoracic rotation with noodle x10 BIL Cat/cow with noodle x1' Standing with UE support edge of pool: Hip abd/add x10 BIL Hamstring curl x15 BIL Hip extension x10 BIL Squats 2x10 Heel/toe raises 2x10 Standing marching 2x10 BIL Hip ext/flex with knee straight x 10 BIL L stretch on wall 5" hold x5  Pt requires the buoyancy of water for active assisted exercises with buoyancy supported for strengthening and AROM exercises. Hydrostatic pressure also supports joints by unweighting joint load by at least 50 % in 3-4 feet depth water. 80% in chest to neck deep water. Water will provide assistance with movement using the current and laminar flow while the buoyancy reduces weight bearing. Pt requires the viscosity of the water for resistance with strengthening exercises.    PATIENT EDUCATION:  Education details: POC, explanation of findings, issue HEP Person educated: Patient Education method: Explanation, Demonstration, Tactile cues, Verbal cues, and Handouts Education comprehension: verbalized understanding, returned demonstration, verbal cues required, tactile cues  required, and needs further education  HOME EXERCISE PROGRAM: Access Code: [email or web address]  updated 11-10-23 URL: [email or web address] Date: 11/10/2023 Prepared by: Garen Lah  Exercises - Supine Lower Trunk Rotation  - 1 x daily - 7 x weekly - 1 sets - 5 reps - 20 sec  hold - Supine Active Straight Leg Raise  - 1 x daily - 7 x weekly - 3 sets - 10 reps - Sit to stand with sink support Movement snack  - 1 x daily - 7 x weekly - 3 sets - 5-10 reps - Seated Long Arc Quad  - 1 x daily - 7 x  weekly - 3 sets - 10 reps - Standing Hip Abduction with Counter Support  - 1 x daily - 7 x weekly - 3 sets - 10 reps - Standing Hip Extension with Counter Support  - 1 x daily - 7 x weekly - 3 sets - 10 reps - Heel raise with counter support and towel under toes  - 1 x daily - 7 x weekly - 3 sets - 10 reps - Clam with Resistance  - 1 x daily - 7 x weekly - 3 sets - 10 reps  ASSESSMENT:  CLINICAL IMPRESSION: Patient presents to PT reporting mild pain in her knees and lower back, which she partly attributes to the rainy weather this morning. Session today focused on improving standing activity tolerance as well as LE strengthening. She is somewhat limited by fatigue during session, requiring occasional rest breaks, but is able to complete all exercises. Patient continues to benefit from skilled PT services and should be progressed as able to improve functional independence.   EVAL- Patient is a 68 y.o. female who was seen today for physical therapy evaluation and treatment for bil chronic knee pain. Pt also reports falling 2 x a month because of unsteadiness and weakness in knees.  Pt also complains of midline back pain that sometimes radiates into buttocks. Pt often cares for grandkids and feels ill equipped to respond in a quick manner to children's needs due to weakness.  Pt will benefit from skilled PT to address deconditioning, weakness and falls prevention.  OBJECTIVE IMPAIRMENTS: decreased activity tolerance, decreased balance, decreased knowledge of condition, decreased knowledge of use of DME, decreased mobility, difficulty walking, obesity, and pain.   ACTIVITY LIMITATIONS: standing, squatting, sleeping, stairs, bathing, hygiene/grooming, locomotion level, and caring for others  PARTICIPATION LIMITATIONS: meal prep, cleaning, laundry, shopping, community activity, and church  PERSONAL FACTORS:  See pertinent medical history and sedentary lifestyle  are also affecting patient's functional  outcome.   REHAB POTENTIAL: Fair sedentary lifestyle and multiple falls  CLINICAL DECISION MAKING: Evolving/moderate complexity  EVALUATION COMPLEXITY: Moderate   GOALS: Goals reviewed with patient? Yes  SHORT TERM GOALS: Target date: 11-12-23 Pt will be independent with initial HEP Baseline:no knowledge Goal status: MET  2.  Pt will begin to walk 10 minutes without stopping to show increased mobility Baseline: Pt unable to walk for 5 min without bil knee pain 10-28-23 with 3 back stretch breaks  350.2 ft(1541ft to 198 ft Norm) Goal status: ONGOING  3.  Demonstrate understanding of neutral posture and be more conscious of position and posture throughout the day.  Baseline: limited knowledge 11/04/23: has started using lumbar support pillow which has helped Goal status: MET  4. Pt will be educated on fall preparedness and verbalize at least 3 safety techniques to remain living in home independently to reduce falls  Baseline  No knowledge  11/04/23: Has slip proof mat in shower, kitchen, keeps floor clean from clutter. Discussed more during session   Goal status: MET    LONG TERM GOALS: Target date: 12-08-23  Pt will be independent with advanced HEP.  Baseline: no knowledge Goal status: ONGOING  2.  Pt will be educated on sleeping aids and posture helps to receive 4 or more hours of sleep uninterrupted Baseline: Pt wakes every2 hours because of pain at night Goal status: ONGOING  3.  Pt will be able to report at least 50% decrease in pain with mobilty Baseline: 8-9/10 pain level Goal status: ONGOING  4.  Pt will be able to demonstrate / simulate safe tub transfer in order to take a bath independently Baseline: unable to take a shower or a bath, and sits on side of tub for bathing Goal status: ONGOING  5.  Pt will be able to stand for 25 minutes in order to complete household chore/self care tasks Baseline: eval less than 5 minutes Goal status: ONGOING  6.  Patient  will report improved functional level on LEFS >/= 50/80 in order to show improved mobility level Baseline: 12/80 15 % Goal status: ONGOING   PLAN:  PT FREQUENCY: 1-2x/week  PT DURATION: 6 weeks  PLANNED INTERVENTIONS: 97164- PT Re-evaluation, 97110-Therapeutic exercises, 97530- Therapeutic activity, 97112- Neuromuscular re-education, 97535- Self Care, 40981- Manual therapy, [phone]- Gait training, [phone]- Aquatic Therapy, [phone]- Electrical stimulation (manual), Patient/Family education, Balance training, Stair training, Taping, Dry Needling, Joint mobilization, Spinal mobilization, Cryotherapy, and Moist heat  PLAN FOR NEXT SESSION: HEP and sit to stands, goals , walking tolerance  Berta Minor PTA  11/13/23 10:00 AM Phone: [phone] Fax: [phone]

## 2023-11-17 ENCOUNTER — Ambulatory Visit: Payer: Self-pay | Admitting: Physical Therapy

## 2023-11-17 DIAGNOSIS — M5442 Lumbago with sciatica, left side: Secondary | ICD-10-CM | POA: Diagnosis not present

## 2023-11-17 DIAGNOSIS — R2689 Other abnormalities of gait and mobility: Secondary | ICD-10-CM | POA: Diagnosis not present

## 2023-11-17 DIAGNOSIS — R262 Difficulty in walking, not elsewhere classified: Secondary | ICD-10-CM | POA: Diagnosis not present

## 2023-11-17 DIAGNOSIS — M6281 Muscle weakness (generalized): Secondary | ICD-10-CM | POA: Diagnosis not present

## 2023-11-17 DIAGNOSIS — G8929 Other chronic pain: Secondary | ICD-10-CM | POA: Diagnosis not present

## 2023-11-17 DIAGNOSIS — M25561 Pain in right knee: Secondary | ICD-10-CM | POA: Diagnosis not present

## 2023-11-17 DIAGNOSIS — M25562 Pain in left knee: Secondary | ICD-10-CM | POA: Diagnosis not present

## 2023-11-17 NOTE — Therapy (Signed)
 OUTPATIENT PHYSICAL THERAPY LOWER EXTREMITY TREATMENT/DiSCHARGE PHYSICAL THERAPY DISCHARGE SUMMARY  Visits from Start of Care: 9  Current functional level related to goals / functional outcomes: As indicated below   Remaining deficits: Endurance needs to improve and her walking time needs to improve, given walking program   Education / Equipment: HEP, Walking program   Patient agrees to discharge. Patient goals were  all met except for the sleep goal deferred . Patient is being discharged due to being pleased with the current functional level. And has HEP for walking and exercise  Pt has to use transportation and sometimes has to wait extended times for therapy and she would prefer to continue at home.    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 Bennet Brasil.    Hypercholesteremia    Hypertension    Hypertension    Hypertensive urgency 05/2006   Stroke (cerebrum) (HCC)    TIA (transient ischemic attack) 2017   Past Surgical History:  Procedure Laterality Date   ARTERY BIOPSY Right 09/18/2017   Procedure: BIOPSY OF RIGHT TEMPORAL ARTERY;  Surgeon:  Oza Blumenthal, MD;  Location: [facility] OR;  Service: General;  Laterality: Right;   COLON SURGERY     no per pt   DILATION AND CURETTAGE OF UTERUS  08/04/1974   MENISCUS REPAIR  10/03/2007   right knee   TONSILECTOMY, ADENOIDECTOMY, BILATERAL MYRINGOTOMY AND TUBES  age 81   TONSILLECTOMY     Patient Active Problem List   Diagnosis Date Noted   Poor nutrition 02/18/2023   Physical deconditioning 12/10/2022   Primary osteoarthritis of left knee 10/16/2022   Diabetes mellitus type 2, insulin dependent (HCC) 10/02/2021   Diabetic neuropathy (HCC) 06/21/2018   Chronic midline low back pain 06/21/2018   Mild obstructive sleep apnea 11/18/2013   Constipation 05/28/2011   Atrial fibrillation (HCC) 06/07/2009   Depression 09/29/2008   Hyperlipidemia associated with type 2 diabetes mellitus (HCC) 08/22/2008   CHRONIC KIDNEY DISEASE STAGE II (MILD) 08/01/2008   Essential hypertension 04/16/2007    PCP: Wilhemena Harbour, MD   REFERRING PROVIDER: Persons, Norma Beckers, PA   REFERRING DIAG: [phone] (ICD-10-CM) - Pain in both knees, unspecified chronicity   THERAPY DIAG:  Chronic pain of both knees  Difficulty in walking, not elsewhere classified  Muscle weakness (generalized)  Midline low back pain with left-sided sciatica, unspecified chronicity  Other abnormalities of gait and mobility  Rationale for Evaluation and Treatment: Rehabilitation  ONSET  DATE: Worsening over the past year   I have had knee pain for over 15 years  SUBJECTIVE:   SUBJECTIVE STATEMENT: Patient reports that her knees are a little sore this morning, attributes to the rainy weather. She reports she was able to walk around the grocery store the other day without needing to stop for a rest break.  EVAL-My doctor recommended PT for my arthritis and my weakness. I have been falling about 2 x a month because of my weakness. I have a hard time going up stairs.  My knees are very painful. I can only stand and  walk about 5 to 10 minutes. I can't get in bath tub  I dont sleep well only about 2 hours at a time. 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 Yes: NPRS scale: Right  knee 0/10 and at worst 8/10  Left knee 0/10 and at worst 9/10 Pain location: bil knees Pain description: achy sharp like somebody picking and stabbing Aggravating factors: standing more than 5 -10 min, getting up quickly to care for grandkids, stairs I can't take a bath tub  I sit on edge and do a sponge bath,  Relieving factors: salonpas patches and voltaren cream Back pain- at worst 9/10 and at rest 0/10  achy in mid back PRECAUTIONS: None  RED FLAGS: None   WEIGHT BEARING RESTRICTIONS: No  FALLS:  Has patient fallen in last 6 months? Yes. Number of falls 2 x a month if I get up quickly my knees feel like they " give way"   LIVING ENVIRONMENT: Lives with: lives with their family with dtr Lives in: House/apartment Stairs: Yes: External: 3 steps; can reach both Has following equipment at home: Single point cane and Walker - 4 wheeled  OCCUPATION: retired former Chief Strategy Officer and med tech  PLOF: Independent with household mobility with device and mostly sedentary  PATIENT GOALS: be able to walk with out stumbling and my knees giving out,   NEXT MD VISIT: TBD  OBJECTIVE:  Note: Objective measures were completed at Evaluation unless otherwise noted.  DIAGNOSTIC FINDINGS: see medical record  PATIENT SURVEYS:  LEFS 12/80  15%  COGNITION: Overall cognitive status: Within functional limits for tasks assessed     SENSATION: WFL Diabetic neuropathy  EDEMA:  NT  MUSCLE LENGTH: Hamstrings: Right bil tightness 54   POSTURE: rounded shoulders, forward head, flexed trunk , and obesity  PALPATION: Global tenderness around knee joint, arthritic  pain   LUMBAR ROM:   AROM eval 11-10-23  Flexion Fingertips to knees bil Fingertips to ankles  Extension 0 25 % with pain  Right lateral flexion Limited 75% 50%  Left lateral flexion Limted 75% 50%  Right rotation Limited 75% 50%  Left rotation Limited 75% 50%   (Blank rows = not tested)   LOWER EXTREMITY ROM:  Active ROM Right eval Left eval R/L 11-17-23  Hip flexion     Hip extension     Hip abduction     Hip adduction     Hip internal rotation     Hip external rotation     Knee flexion 116/ P 125 120/P127 A 120/ 120  Knee extension -7 -5 -3/ 0  Ankle dorsiflexion 5 5  9/12  Ankle plantarflexion     Ankle inversion  Ankle eversion      (Blank rows = not tested)  LOWER EXTREMITY MMT:  MMT Right eval Left eval 11-17-23 R/L  Hip flexion 4- 4- 4/4  Hip extension 3- 3- 4/4  Hip abduction 3- 4- 4/4  Hip adduction     Hip internal rotation     Hip external rotation     Knee flexion 4- 4 4/4  Knee extension 4- 4 4/4  Ankle dorsiflexion     Ankle plantarflexion 4/25 3/25 Heel raise bil 10/10  Ankle inversion     Ankle eversion      (Blank rows = not tested)  LOWER EXTREMITY SPECIAL TESTS:  NT  FUNCTIONAL TESTS:  5 times sit to stand: 53.32 sec; 11/04/23: 29.7 sec standard chair hands on thighs.  2 minute walk test: 131.8  Norm for age 5665ft 10-28-23  with 3 back stretch breaks  350.2 ft(1510ft to 198 ft Norm) 5 x STS 11-10-23  25.88 sec 10/21/23 BERG Total Score: 42/56  Pt must use AD for balance  GAIT: Distance walked: 131.8 ft Assistive device utilized: Single point cane Level of assistance: Modified independence Comments: Pt with antalgic gait and shortened stride length   [facility] Adult PT Treatment:                                                DATE: 11-17-23  662 ft needed 2 breaks (Norm for age [phone]) Therapeutic Exercise: Seated resisted LAQ RTB 2 x 10 Seated resisted knee flexion 2 x 10 Supine Clam GTB 2x15 Bridge 10 x 2  SLR x  10  Therapeutic Activity: Standing bil heel raises x 10 Standing hip ext bil holding onto counter x 10 Standing hip abd bil holding onto counter x 10   [facility] Adult PT Treatment:                                                DATE: 11/13/23 Therapeutic Exercise: LAQ 5# 2 x 10 Supine Clam GTB 2x15 Bridge 10 x 2  SLR x 10 Therapeutic Activity: STS arms crossed Step ups 4" x10 BIL - CGA Standing bil heel raises x 10 Standing hip ext bil holding onto counter x 10 Standing hip abd bil holding onto counter x 10   [facility] Adult PT Treatment:                                                DATE: 11-10-23 5xSTS 25.88 sec Therapeutic Exercise: LAQ 5# 2 x 10 Supine Clam GTB  Bridge 10 x 2  SLR 2 x 10  Manual bil STW of Quadratus lumborum with added stretch  Overstretch for hip ER and IR and piriformis PROM  Therapeutic Activity: STS with 10 # DB 2x 10 Step ups  1x 10 on R and the on Left with close supervision Standing bil heel raises x 10 Standing hip ext bil holding onto counter x 10 Standing hip abd bil holding onto counter x 10  [facility] Adult PT Treatment:  DATE: 11/06/23 Aquatic therapy at [facility]- [address] - therapeutic pool temp approximately 91 degrees. Pt enters building ambulating with SPC. Treatment took place in water 3.8 to  4 ft 8 in. deep depending upon activity.  Pt entered and exited the pool via stair and handrails independently with step to pattern.  Therapeutic Exercise: Aquatic Exercise: Sidestepping hanging onto wall x4 steps x3 laps Step ups on bottom step fwd x10 BIL Thoracic rotation with noodle x10 BIL Cat/cow with noodle x1' Standing with UE support edge of pool: Hip abd/add x10 BIL Hamstring curl x15 BIL Hip extension x10 BIL Squats 2x10 Heel/toe raises 2x10 Standing marching 2x10 BIL Hip ext/flex with knee straight x 10 BIL L stretch on wall 5" hold x5  Pt requires the buoyancy of water for  active assisted exercises with buoyancy supported for strengthening and AROM exercises. Hydrostatic pressure also supports joints by unweighting joint load by at least 50 % in 3-4 feet depth water. 80% in chest to neck deep water. Water will provide assistance with movement using the current and laminar flow while the buoyancy reduces weight bearing. Pt requires the viscosity of the water for resistance with strengthening exercises.    PATIENT EDUCATION:  Education details: POC, explanation of findings, issue HEP Person educated: Patient Education method: Explanation, Demonstration, Tactile cues, Verbal cues, and Handouts Education comprehension: verbalized understanding, returned demonstration, verbal cues required, tactile cues required, and needs further education  HOME EXERCISE PROGRAM: Access Code: [email or web address]   updated 4-15-25for more resistance URL: [email or web address] Date: 11/17/2023 Prepared by: Garen Lah  Program Notes Remember to walk every day and add minutes. Given in separate HEP  Exercises - Supine Lower Trunk Rotation  - 1 x daily - 7 x weekly - 1 sets - 5 reps - 20 sec  hold - Supine Active Straight Leg Raise  - 1 x daily - 7 x weekly - 3 sets - 10 reps - Sit to stand with sink support Movement snack  - 1 x daily - 7 x weekly - 3 sets - 5-10 reps - Standing Hip Abduction with Counter Support  - 1 x daily - 7 x weekly - 3 sets - 10 reps - Standing Hip Extension with Counter Support  - 1 x daily - 7 x weekly - 3 sets - 10 reps - Heel raise with counter support and towel under toes  - 1 x daily - 7 x weekly - 3 sets - 10 reps - Clam with Resistance  - 1 x daily - 7 x weekly - 3 sets - 10 reps - Seated Knee Extension with Resistance  - 1 x daily - 7 x weekly - 3 sets - 10 reps - Seated Knee Flexion with Anchored Resistance  - 1 x daily - 7 x weekly - 3 sets - 10 reps  ASSESSMENT:  CLINICAL IMPRESSION: Patient presents to PT reporting  no pain in  knees and back for the last week.  662 ft needed 2 breaks improved from evaluation but not within normal range.  Pt requests to DC. She has to wait a long time sometimes 3 hours for her transportation and she also has many tasks and caring for grandchild.  She reports that her sister died last week and she would  like to DC.  Pt reports she has made good improvement and would like to continue at home. She has improved her MMT to about 4/5. Pt reviewed HEP for home use and emphasis on Walking program.  Pt needs to continue working on standing tolerance and emphasis on daily walking. She reports she is able to accomplish task in home within 25 min.   She is somewhat limited by fatigue during session, requiring occasional rest breaks, but is able to complete all exercises. Patient will be DC as per pt request  EVAL- Patient is a 68 y.o. female who was seen today for physical therapy evaluation and treatment for bil chronic knee pain. Pt also reports falling 2 x a month because of unsteadiness and weakness in knees.  Pt also complains of midline back pain that sometimes radiates into buttocks. Pt often cares for grandkids and feels ill equipped to respond in a quick manner to children's needs due to weakness.  Pt will benefit from skilled PT to address deconditioning, weakness and falls prevention.  OBJECTIVE IMPAIRMENTS: decreased activity tolerance, decreased balance, decreased knowledge of condition, decreased knowledge of use of DME, decreased mobility, difficulty walking, obesity, and pain.   ACTIVITY LIMITATIONS: standing, squatting, sleeping, stairs, bathing, hygiene/grooming, locomotion level, and caring for others  PARTICIPATION LIMITATIONS: meal prep, cleaning, laundry, shopping, community activity, and church  PERSONAL FACTORS:  See pertinent medical history and sedentary lifestyle  are also affecting patient's functional outcome.   REHAB POTENTIAL: Fair sedentary lifestyle and multiple  falls  CLINICAL DECISION MAKING: Evolving/moderate complexity  EVALUATION COMPLEXITY: Moderate   GOALS: Goals reviewed with patient? Yes  SHORT TERM GOALS: Target date: 11-12-23 Pt will be independent with initial HEP Baseline:no knowledge Goal status: MET  2.  Pt will begin to walk 10 minutes without stopping to show increased mobility Baseline: Pt unable to walk for 5 min without bil knee pain 10-28-23 with 3 back stretch breaks  350.2 ft(1516ft to 198 ft Norm) 11-17-23  10-15 min at home sweeping Goal status: MET  3.  Demonstrate understanding of neutral posture and be more conscious of position and posture throughout the day.  Baseline: limited knowledge 11/04/23: has started using lumbar support pillow which has helped Goal status: MET  4. Pt will be educated on fall preparedness and verbalize at least 3 safety techniques to remain living in home independently to reduce falls  Baseline  No knowledge  11/04/23: Has slip proof mat in shower, kitchen, keeps floor clean from clutter. Discussed more during session   Goal status: MET    LONG TERM GOALS: Target date: 12-08-23  Pt will be independent with advanced HEP.  Baseline: no knowledge Goal status: MET  2.  Pt will be educated on sleeping aids and posture helps to receive 4 or more hours of sleep uninterrupted Baseline: Pt wakes every2 hours because of pain at night 11-17-23  this week not sleeping because my sister died Goal status: Deferred  3.  Pt will be able to report at least 50% decrease in pain with mobilty Baseline: 8-9/10 pain level 11-17-23  no pain last week or today Goal status: MET  4.  Pt will be able to demonstrate / simulate safe tub transfer in order to take a bath independently Baseline: unable to take a shower or a bath, and sits on side of tub for bathing 11-17-23 Pt able to simulate getting in and our of tub I Goal status: MET  5.  Pt will be able to stand for 25 minutes in order to complete  household chore/self care tasks Baseline: eval less than 5 minutes 11-17-23  sweeping 10-15, putting up dishes and kitchen about 25 min Goal status: MET  6.  Patient will report improved functional level on LEFS >/= 50/80 in order to show improved mobility level Baseline: 12/80 15 % 11-17-23  62/80 77.5% Goal status: MET   PLAN:  PT FREQUENCY: 1-2x/week  PT DURATION: 6 weeks  PLANNED INTERVENTIONS: 97164- PT Re-evaluation, 97110-Therapeutic exercises, 97530- Therapeutic activity, 97112- Neuromuscular re-education, 97535- Self Care, 16109- Manual therapy, [phone]- Gait training, [phone]- Aquatic Therapy, [phone]- Electrical stimulation (manual), Patient/Family education, Balance training, Stair training, Taping, Dry Needling, Joint mobilization, Spinal mobilization, Cryotherapy, and Moist heat  PLAN FOR NEXT SESSION: HEP and sit to stands, goals , walking tolerance  Sharlet Dawson, PT, [facility] Certified Exercise Expert for the Aging Adult  11/17/23 1:24 PM Phone: [phone] Fax: [phone]

## 2023-11-17 NOTE — Patient Instructions (Signed)
 WALKING  Walking is a great form of exercise to increase your strength, endurance and overall fitness.  A walking program can help you start slowly and gradually build endurance as you go.  Everyone's ability is different, so each person's starting point will be different.  You do not have to follow them exactly.  The are just samples. You should simply find out what's right for you and stick to that program.   In the beginning, you'll start off walking 2-3 times a day for short distances.  As you get stronger, you'll be walking further at just 1-2 times per day. In order to build up your core and endurance, you must try to walk continuously every day. American Medical Association recommends at least 30 to 45 minutes of walking 5 days a week  A. You Can Walk For A Certain Length Of Time Each Day  Start here Ms Winchell    Walk 5 minutes 3 times per day.  Increase 2 minutes every 2 days (3 times per day).  Work up to 25-30 minutes (1-2 times per day).   Example:   Day 1-2 5 minutes 3 times per day   Day 7-8 12 minutes 2-3 times per day   Day 13-14 25 minutes 1-2 times per day  B. You Can Walk For a Certain Distance Each Day     Distance can be substituted for time.    Example:   3 trips to mailbox (at road)   3 trips to corner of block   3 trips around the block  C. Go to local [employer] school and use the track.    Walk for distance ____ around track  Or time ____ minutes  D. Walk __x__ Jog ____ Run ___  Please only do the exercises that your therapist has initialed and dated

## 2023-11-19 ENCOUNTER — Ambulatory Visit: Payer: Self-pay | Admitting: Physical Therapy

## 2023-12-01 ENCOUNTER — Other Ambulatory Visit: Payer: Self-pay | Admitting: Student

## 2023-12-01 DIAGNOSIS — E1169 Type 2 diabetes mellitus with other specified complication: Secondary | ICD-10-CM

## 2023-12-02 ENCOUNTER — Encounter: Payer: Self-pay | Admitting: Student

## 2023-12-02 ENCOUNTER — Ambulatory Visit: Admitting: Student

## 2023-12-02 VITALS — BP 135/73 | HR 82 | Ht 62.0 in | Wt 218.6 lb

## 2023-12-02 DIAGNOSIS — Z794 Long term (current) use of insulin: Secondary | ICD-10-CM

## 2023-12-02 DIAGNOSIS — F32A Depression, unspecified: Secondary | ICD-10-CM

## 2023-12-02 DIAGNOSIS — E119 Type 2 diabetes mellitus without complications: Secondary | ICD-10-CM

## 2023-12-02 LAB — POCT GLYCOSYLATED HEMOGLOBIN (HGB A1C): HbA1c, POC (controlled diabetic range): 7 % (ref 0.0–7.0)

## 2023-12-02 NOTE — Assessment & Plan Note (Addendum)
 Patient comes in for follow-up of her depression.  Patient appreciates that she is struggling with depression at this time.  Patient notes that she has significant life stressors, as her sisters recently passed a week ago.  Patient reports she starting to feel better, less depressed, feel more like herself.  Patient appreciates she gets a lot of support from her church, and her daughters also help support her.  Patient denies any suicide ideation or plans to harm herself.  Patient does not want to start medications at this time, and does not want to start therapy at this time.  Will have patient follow-up as needed. - As needed follow-up

## 2023-12-02 NOTE — Assessment & Plan Note (Addendum)
 Patient comes in for follow-up of her diabetes.  Patient appreciates that she has not been taking her diabetes medicine for the last week or so, secondary to life stresses/depression.  She is willing to restart taking medications now.  A1c checked today, it still improved.  Patient appreciates she still been taking the old 2 mg dose of Ozempic  as the 1 mg dose of Ozempic  had not been delivered.  Will plan to start 1 mg dose of Ozempic . A1c 7.0 today. - Lantus  33 units daily - Humalog  25 units with largest meal - Ozempic  1 mg weekly - Jardiance  25 mg daily - Follow-up 2 months

## 2023-12-02 NOTE — Progress Notes (Signed)
 SUBJECTIVE:   CHIEF COMPLAINT / HPI:   Diabetes 2 - Meds: Lantus  33 units daily, Humalog  25 units with largest meal, Ozempic  1 mg weekly, Jardiance  25 mg daily  Regarding her diabetes management, her A1c has improved to 7.0. She has not taken Lantus  this week but has been taking her morning medications and blood thinner. She plans to resume Lantus  at 33 units. She mentions receiving her Ozempic  this week and plans to start it at a dose of 1 mg. She continues to take Jardiance  and uses a Dexcom for glucose monitoring, which she was without for a week due to insurance issues.   Depression She has been experiencing depression for the past few weeks following the death of her younger sister. She describes feeling better recently, attributing her improvement to accepting the reality of the situation and support from her church community. She is beginning to feel sociable again and is regaining her interest in attending church, although she is not yet back to her usual self. She anticipates further closure after her sister's funeral this Saturday. No thoughts of self-harm or suicidal ideation.   PERTINENT  PMH / PSH:   OBJECTIVE:  BP 135/73   Pulse 82   Ht 5\' 2"  (1.575 m)   Wt 218 lb 9.6 oz (99.2 kg)   SpO2 99%   BMI 39.98 kg/m  Physical Exam Constitutional:      Appearance: Normal appearance.  Cardiovascular:     Rate and Rhythm: Normal rate and regular rhythm.     Heart sounds: Normal heart sounds. No murmur heard.    No friction rub. No gallop.  Pulmonary:     Effort: Pulmonary effort is normal. No respiratory distress.     Breath sounds: No stridor. No wheezing, rhonchi or rales.  Abdominal:     General: Abdomen is flat. There is no distension.     Palpations: Abdomen is soft. There is no mass.     Tenderness: There is no abdominal tenderness. There is no guarding or rebound.     Hernia: No hernia is present.  Neurological:     Mental Status: She is alert.  Psychiatric:         Mood and Affect: Affect normal. Mood is depressed.      ASSESSMENT/PLAN:   Assessment & Plan Diabetes mellitus type 2, insulin  dependent (HCC) Patient comes in for follow-up of her diabetes.  Patient appreciates that she has not been taking her diabetes medicine for the last week or so, secondary to life stresses/depression.  She is willing to restart taking medications now.  A1c checked today, it still improved.  Patient appreciates she still been taking the old 2 mg dose of Ozempic  as the 1 mg dose of Ozempic  had not been delivered.  Will plan to start 1 mg dose of Ozempic . A1c 7.0 today. - Lantus  33 units daily - Humalog  25 units with largest meal - Ozempic  1 mg weekly - Jardiance  25 mg daily - Follow-up 2 months Depression, unspecified depression type Patient comes in for follow-up of her depression.  Patient appreciates that she is struggling with depression at this time.  Patient notes that she has significant life stressors, as her sisters recently passed a week ago.  Patient reports she starting to feel better, less depressed, feel more like herself.  Patient appreciates she gets a lot of support from her church, and her daughters also help support her.  Patient denies any suicide ideation or plans to harm herself.  Patient does not want to start medications at this time, and does not want to start therapy at this time.  Will have patient follow-up as needed. - As needed follow-up No follow-ups on file. Wilhemena Harbour, MD 12/02/2023, 9:18 AM PGY-3, [facility]

## 2023-12-02 NOTE — Patient Instructions (Addendum)
 It was great to see you! Thank you for allowing me to participate in your care!  I recommend that you always bring your medications to each appointment as this makes it easy to ensure we are on the correct medications and helps us  not miss when refills are needed.  Our plans for today:  - Diabetes  Continue current regimen: Lantus  33 units daily Humalog  25 units with largest meal Ozempic  1 mg weekly Jardiance  25 mg daily Follow up 2 months - Depression If you decide you would like some help with your depression, please let me know. We can start medications or connect you with Therapy resources   -Knee Pain Use Lidocaine  Patches If your knee pain get's worse or does not get better, call the clinic and let me know and I will put together a different pain regimen.    Take care and seek immediate care sooner if you develop any concerns.   Dr. Wilhemena Harbour, MD [facility]

## 2023-12-22 ENCOUNTER — Other Ambulatory Visit: Payer: Self-pay | Admitting: Student

## 2023-12-22 DIAGNOSIS — E1169 Type 2 diabetes mellitus with other specified complication: Secondary | ICD-10-CM

## 2024-01-12 ENCOUNTER — Encounter: Payer: Self-pay | Admitting: *Deleted

## 2024-01-27 ENCOUNTER — Other Ambulatory Visit: Payer: Self-pay | Admitting: Student

## 2024-01-27 DIAGNOSIS — M1712 Unilateral primary osteoarthritis, left knee: Secondary | ICD-10-CM

## 2024-01-27 DIAGNOSIS — E119 Type 2 diabetes mellitus without complications: Secondary | ICD-10-CM

## 2024-01-28 ENCOUNTER — Other Ambulatory Visit: Payer: Self-pay | Admitting: Student

## 2024-01-28 MED ORDER — SEMAGLUTIDE (1 MG/DOSE) 4 MG/3ML ~~LOC~~ SOPN: 1.0000 mg | PEN_INJECTOR | SUBCUTANEOUS | 2 refills | Status: DC

## 2024-02-22 ENCOUNTER — Other Ambulatory Visit: Payer: Self-pay | Admitting: Student

## 2024-02-22 DIAGNOSIS — Z794 Long term (current) use of insulin: Secondary | ICD-10-CM

## 2024-02-22 NOTE — Telephone Encounter (Signed)
 Its me until Wednesday, no worries!

## 2024-02-26 ENCOUNTER — Ambulatory Visit: Admitting: Family Medicine

## 2024-02-26 ENCOUNTER — Encounter: Payer: Self-pay | Admitting: Family Medicine

## 2024-02-26 ENCOUNTER — Ambulatory Visit (INDEPENDENT_AMBULATORY_CARE_PROVIDER_SITE_OTHER): Admitting: Family Medicine

## 2024-02-26 VITALS — BP 116/68 | HR 80 | Ht 62.0 in | Wt 222.4 lb

## 2024-02-26 DIAGNOSIS — Z23 Encounter for immunization: Secondary | ICD-10-CM | POA: Diagnosis not present

## 2024-02-26 DIAGNOSIS — E119 Type 2 diabetes mellitus without complications: Secondary | ICD-10-CM

## 2024-02-26 DIAGNOSIS — Z794 Long term (current) use of insulin: Secondary | ICD-10-CM

## 2024-02-26 DIAGNOSIS — E1169 Type 2 diabetes mellitus with other specified complication: Secondary | ICD-10-CM | POA: Diagnosis not present

## 2024-02-26 LAB — POCT GLYCOSYLATED HEMOGLOBIN (HGB A1C): HbA1c, POC (controlled diabetic range): 7.9 % — AB (ref 0.0–7.0)

## 2024-02-26 MED ORDER — OZEMPIC (2 MG/DOSE) 8 MG/3ML ~~LOC~~ SOPN
1.0000 | PEN_INJECTOR | SUBCUTANEOUS | 2 refills | Status: DC
Start: 2024-02-26 — End: 2024-05-18

## 2024-02-26 NOTE — Progress Notes (Signed)
    SUBJECTIVE:   CHIEF COMPLAINT / HPI:   T2DM - current medications: Lantus  30 daily, Humalog  25 u with largest meal daily, jardiance  25 daily, Ozempic  1 mg weekly   - wants to go back to 2 mg ozempic  as this helped more with weight loss  Jardiance  25 daily  - Over past 14 days, CGM data shows: Avg BG 178 with 56% in goal range, 38% high, 5 % very high  - Over past 30 days, CGM shows: 65% in goal range  - Needs to see eye doctor this year   PERTINENT  PMH / PSH: T2DM  OBJECTIVE:   Pulse 80   Ht 5' 2 (1.575 m)   Wt 222 lb 6.4 oz (100.9 kg)   SpO2 99%   BMI 40.68 kg/m    General: Well-appearing. Resting comfortably in room. CV: Normal S1/S2. No extra heart sounds. Warm and well-perfused. Pulm: Breathing comfortably on room air. CTAB. No increased WOB. Abd: Soft, non-tender, non-distended. Skin:  Warm, dry. Psych: Pleasant and appropriate.   ASSESSMENT/PLAN:   Assessment & Plan Diabetes mellitus type 2, insulin  dependent (HCC) A1c well controlled at 7.9 today. CGM data overall reassuring. Last ACR normal in Feb 2025.  - Cont current regimen of Lantus  30 daily, Humalog  25 with largest meal, Jardiance  25 daily  - Increase to 2 mg Ozempic  weekly - per chart review, patient previously on 2mg  dose but then decreased back to 1mg  due to provider concern about appetite/muscle loss - discussed and patient would like to try 2 mg again  - BMP, Lipid panel today  - Discussed yearly diabetic eye exam    Patient also received pneumococcal vaccine during visit today.   RTC in 1 month for FU.   Damien Cassis, MD [facility]

## 2024-02-26 NOTE — Progress Notes (Signed)
 Entered in error. Please see other clinic note from 02/26/24.

## 2024-02-26 NOTE — Patient Instructions (Signed)
 Thank you for visiting clinic today and allowing us  to participate in your care!  Your A1c looks okay today! We can increase your Ozempic  back to 2mg  per week to help with weight loss. We are collecting some labwork today and I will contact you with the results.   Please plan to see your eye doctor soon for your yearly diabetic eye exam.   Please go to your local pharmacy to get your second shingles vaccine.   Please schedule an appointment in 1 month for follow up.   Reach out any time with any questions or concerns you may have - we are here for you!  Damien Cassis, MD [facility] [phone]

## 2024-02-27 LAB — LIPID PANEL
Chol/HDL Ratio: 2.7 ratio (ref 0.0–4.4)
Cholesterol, Total: 122 mg/dL (ref 100–199)
HDL: 45 mg/dL (ref >39)
LDL Chol Calc (NIH): 58 mg/dL (ref 0–99)
Triglycerides: 101 mg/dL (ref 0–149)
VLDL Cholesterol Cal: 19 mg/dL (ref 5–40)

## 2024-02-27 LAB — BASIC METABOLIC PANEL WITH GFR
BUN/Creatinine Ratio: 23 (ref 12–28)
BUN: 29 mg/dL — ABNORMAL HIGH (ref 8–27)
CO2: 22 mmol/L (ref 20–29)
Calcium: 9.9 mg/dL (ref 8.7–10.3)
Chloride: 103 mmol/L (ref 96–106)
Creatinine, Ser: 1.24 mg/dL — ABNORMAL HIGH (ref 0.57–1.00)
Glucose: 189 mg/dL — ABNORMAL HIGH (ref 70–99)
Potassium: 3.9 mmol/L (ref 3.5–5.2)
Sodium: 143 mmol/L (ref 134–144)
eGFR: 47 mL/min/1.73 — ABNORMAL LOW (ref >59)

## 2024-02-28 NOTE — Assessment & Plan Note (Signed)
 A1c well controlled at 7.9 today. CGM data overall reassuring. Last ACR normal in Feb 2025.  - Cont current regimen of Lantus  30 daily, Humalog  25 with largest meal, Jardiance  25 daily  - Increase to 2 mg Ozempic  weekly - per chart review, patient previously on 2mg  dose but then decreased back to 1mg  due to provider concern about appetite/muscle loss - discussed and patient would like to try 2 mg again  - BMP, Lipid panel today  - Discussed yearly diabetic eye exam

## 2024-02-29 ENCOUNTER — Ambulatory Visit: Payer: Self-pay | Admitting: Family Medicine

## 2024-05-17 ENCOUNTER — Other Ambulatory Visit: Payer: Self-pay | Admitting: Family Medicine

## 2024-05-18 NOTE — Telephone Encounter (Signed)
 Chart reviewed. Rx refilled. Requesting patient fu.

## 2024-06-03 ENCOUNTER — Encounter: Payer: Self-pay | Admitting: Family Medicine

## 2024-06-03 ENCOUNTER — Ambulatory Visit (INDEPENDENT_AMBULATORY_CARE_PROVIDER_SITE_OTHER): Admitting: Family Medicine

## 2024-06-03 VITALS — BP 117/67 | HR 74 | Ht 62.0 in | Wt 227.4 lb

## 2024-06-03 DIAGNOSIS — Z794 Long term (current) use of insulin: Secondary | ICD-10-CM | POA: Diagnosis not present

## 2024-06-03 DIAGNOSIS — R4589 Other symptoms and signs involving emotional state: Secondary | ICD-10-CM | POA: Diagnosis not present

## 2024-06-03 DIAGNOSIS — E119 Type 2 diabetes mellitus without complications: Secondary | ICD-10-CM

## 2024-06-03 DIAGNOSIS — M546 Pain in thoracic spine: Secondary | ICD-10-CM | POA: Diagnosis not present

## 2024-06-03 DIAGNOSIS — G8929 Other chronic pain: Secondary | ICD-10-CM

## 2024-06-03 DIAGNOSIS — Z23 Encounter for immunization: Secondary | ICD-10-CM

## 2024-06-03 DIAGNOSIS — E1169 Type 2 diabetes mellitus with other specified complication: Secondary | ICD-10-CM

## 2024-06-03 LAB — POCT GLYCOSYLATED HEMOGLOBIN (HGB A1C): HbA1c, POC (controlled diabetic range): 9.6 % — AB (ref 0.0–7.0)

## 2024-06-03 MED ORDER — LANTUS SOLOSTAR 100 UNIT/ML ~~LOC~~ SOPN: 35.0000 [IU] | PEN_INJECTOR | Freq: Every day | SUBCUTANEOUS | 2 refills | Status: DC

## 2024-06-03 MED ORDER — DEXCOM G7 SENSOR MISC: 11 refills | Status: AC

## 2024-06-03 NOTE — Patient Instructions (Addendum)
 Thank you for visiting clinic today and allowing us  to participate in your care!  Your A1c was high today. Please increase to 35 units lantus  daily. Continue 25 units Humalog  with your largest meal of the day.  Continue Jardiance  and Ozempic  as you have been taking. We collected lab work today and will keep you updated. Please see your eye doctor for your annual diabetes eye exam.   You are scheduled to see our pharmacist, Dr Koval for diabetes follow up.  Future Appointments  Date Time Provider Department Center  06/09/2024  2:30 PM Amalia Maude MATSU RPH-CPP [facility]  09/05/2024  2:20 PM [facility] ANNUAL WELLNESS VISIT [facility]   Please try the exercises for your back pain.   Reach out any time with any questions or concerns you may have - we are here for you!  Damien Cassis, MD [facility] [phone]

## 2024-06-03 NOTE — Progress Notes (Signed)
    SUBJECTIVE:   CHIEF COMPLAINT / HPI:   T2DM -Current regimen: 30 u LAI daily, 25 u SAI daily with largest meal though has not been eating as much meal food in past few days so had not used, Jardiance  25 daily, Ozempic  2mg /week -Usually uses CGM, did not bring device with her today -Reports glucose last week running in the 200s with one episode into 300, no lows -No dizzines, no N/V/D  Mood -Reports recently feeling more depressed -Reports regular occurrence of these episodes throughout the year -Reports no current SI or HI -Not interested in medications or therapy at this time  Back pain -Ongoing for years in lower/mid back  -No recent falls or injuries  -Not interested in physical therapy  PERTINENT  PMH / PSH: T2DM, HTN, Afib, OSA, HLD, CKD2, Depression  OBJECTIVE:   BP 117/67   Pulse 74   Ht 5' 2 (1.575 m)   Wt 227 lb 6.4 oz (103.1 kg)   SpO2 100%   BMI 41.59 kg/m   General: Well-appearing. Resting comfortably in room. CV: Normal S1/S2. No extra heart sounds. Warm and well-perfused. Pulm: Breathing comfortably on room air. CTAB. No increased WOB. Abd: Soft, non-tender, non-distended. Skin:  Warm, dry. MSK: Mild tenderness to palpation of midline thoracic region. No pain to palpation of cervical or lumbar region. Ambulates independently with cane.  Psych: Appropriate.    ASSESSMENT/PLAN:   Assessment & Plan Diabetes mellitus type 2, insulin  dependent (HCC) A1c elevated to 9.6 today, poorly controlled.  - Increase to 35 u lantus  daily - Cont 25u humalog  with largest meal, ozempic  2 mg weekly, jardiance  25 daily  - Discussed and scheduled appt with Dr Koval for further diabetes management and CGM review  - BMP today  - UTD on uACR Depressed mood Patient originally noted score of 2 for last question on PHQ9. Upon conversation with patient, patient clarified she misread the question and has no current SI or HI.  - Though patient not interested at this time,  discussed and provided therapy resources for patient to consider if ever interested  - Patient declines medication at this time Chronic midline thoracic back pain - Patient declines imaging at this time - Patient declines physical therapy referral at this time - Discussed and provided back strengthening exercises to complete at home - Discussed supportive measures  Encounter for immunization Received annual flu vaccine today.    Future Appointments  Date Time Provider Department Center  06/09/2024  2:30 PM Amalia Maude MATSU RPH-CPP [facility]  09/05/2024  2:20 PM [facility] ANNUAL WELLNESS VISIT [facility]    Damien Cassis, MD [facility]

## 2024-06-03 NOTE — Assessment & Plan Note (Signed)
 A1c elevated to 9.6 today, poorly controlled.  - Increase to 35 u lantus  daily - Cont 25u humalog  with largest meal, ozempic  2 mg weekly, jardiance  25 daily  - Discussed and scheduled appt with Dr Koval for further diabetes management and CGM review  - BMP today  - UTD on uACR

## 2024-06-04 LAB — BASIC METABOLIC PANEL WITH GFR
BUN/Creatinine Ratio: 17 (ref 12–28)
BUN: 24 mg/dL (ref 8–27)
CO2: 23 mmol/L (ref 20–29)
Calcium: 9.5 mg/dL (ref 8.7–10.3)
Chloride: 103 mmol/L (ref 96–106)
Creatinine, Ser: 1.42 mg/dL — ABNORMAL HIGH (ref 0.57–1.00)
Glucose: 205 mg/dL — ABNORMAL HIGH (ref 70–99)
Potassium: 3.9 mmol/L (ref 3.5–5.2)
Sodium: 141 mmol/L (ref 134–144)
eGFR: 40 mL/min/1.73 — ABNORMAL LOW (ref >59)

## 2024-06-06 ENCOUNTER — Encounter: Payer: Self-pay | Admitting: Radiology

## 2024-06-06 ENCOUNTER — Ambulatory Visit: Payer: Self-pay | Admitting: Family Medicine

## 2024-06-06 DIAGNOSIS — N182 Chronic kidney disease, stage 2 (mild): Secondary | ICD-10-CM

## 2024-06-09 ENCOUNTER — Ambulatory Visit: Admitting: Pharmacist

## 2024-06-09 ENCOUNTER — Other Ambulatory Visit

## 2024-06-09 ENCOUNTER — Encounter: Payer: Self-pay | Admitting: Pharmacist

## 2024-06-09 VITALS — BP 96/64 | HR 92 | Wt 227.0 lb

## 2024-06-09 DIAGNOSIS — E1142 Type 2 diabetes mellitus with diabetic polyneuropathy: Secondary | ICD-10-CM

## 2024-06-09 DIAGNOSIS — I10 Essential (primary) hypertension: Secondary | ICD-10-CM

## 2024-06-09 DIAGNOSIS — N182 Chronic kidney disease, stage 2 (mild): Secondary | ICD-10-CM

## 2024-06-09 DIAGNOSIS — E1140 Type 2 diabetes mellitus with diabetic neuropathy, unspecified: Secondary | ICD-10-CM

## 2024-06-09 DIAGNOSIS — E119 Type 2 diabetes mellitus without complications: Secondary | ICD-10-CM

## 2024-06-09 DIAGNOSIS — E1169 Type 2 diabetes mellitus with other specified complication: Secondary | ICD-10-CM

## 2024-06-09 MED ORDER — INSULIN LISPRO (1 UNIT DIAL) 100 UNIT/ML (KWIKPEN): 20.0000 [IU] | PEN_INJECTOR | Freq: Every day | SUBCUTANEOUS | Status: DC

## 2024-06-09 MED ORDER — OLMESARTAN-AMLODIPINE-HCTZ 40-10-12.5 MG PO TABS: 1.0000 | ORAL_TABLET | Freq: Every day | ORAL | 3 refills | Status: AC

## 2024-06-09 MED ORDER — LANTUS SOLOSTAR 100 UNIT/ML ~~LOC~~ SOPN: 30.0000 [IU] | PEN_INJECTOR | Freq: Every day | SUBCUTANEOUS | Status: DC

## 2024-06-09 MED ORDER — GABAPENTIN 300 MG PO CAPS
300.0000 mg | ORAL_CAPSULE | Freq: Every day | ORAL | Status: AC
Start: 2024-06-09 — End: ?

## 2024-06-09 NOTE — Assessment & Plan Note (Signed)
 ASCVD risk - secondary prevention in patient with diabetes. Last LDL is 58 at goal of <70  mg/dL. ASCVD risk factors include hypertension, hyperlipidemia, T2DM, AFib. High intensity statin indicated.  -Continued atorvastatin  80 mg.

## 2024-06-09 NOTE — Patient Instructions (Addendum)
 It was nice to see you today!  Your goal blood sugar is 80-130 before eating and less than 180 after eating.  Medication Changes: DECREASE Lantus  (insulin  glargine) to 30 Units into the skin daily. DECREASE Humalog  (insulin  lispro) to 20 units before your biggest meal of the day.   Increase gabapentin  dose to 900 mg in the morning and 300 mg at bedtime  Decrease Olmesartan -amlodipine -hydrochlorothiazide  to 40-10-12.5 mg once daily  Continue all other medication the same.   Keep up the good work with diet and exercise. Aim for a diet full of vegetables, fruit and lean meats (chicken, turkey, fish). Try to limit salt intake by eating fresh or frozen vegetables (instead of canned), rinse canned vegetables prior to cooking and do not add any additional salt to meals.

## 2024-06-09 NOTE — Assessment & Plan Note (Signed)
 Hypertension longstanding since 2008 currently controlled. Blood pressure goal of <130/80  mmHg. Hypertension medication adherence good. Office Blood Pressure is on the lower end today at 96/64, patient denied symptoms of dizziness. Patient reported frequent urination throughout the day.  -Decreased dose of olmesartan -amlodipine -hydrochlorothiazide  to 40-10-12.5 mg daily

## 2024-06-09 NOTE — Progress Notes (Signed)
 S:     Chief Complaint  Patient presents with   Medication Management    Diabetes Managment   68 y.o. female who presents for diabetes evaluation, education, and management. Patient arrives in  good spirits and presents without any assistance.   Patient was referred and last seen by Primary Care Provider, Dr. Diona, on 06/03/2024.  At last visit with PCP on 10/31, A1C elevated to 9.6, diabetes poorly uncontrolled. Dr. Diona increased Lantus  (insulin  glargine) from 30U to 35 U daily.   PMH is significant for T2DM, hyperlipidemia, depression, AFib, Chronic Kidney Disease stage II, HTN.   Current diabetes medications include: Lantus  (insulin  glargine) 35 U daily, Humalog  (insulin  lispro) 25 U before biggest meal of the day, Jardiance  (empagliflozin ) 25 mg daily, Ozempic  (semaglutide ) 2 mg weekly Current hypertension medications include: olmesartan -amlodipine -hydrochlorothiazide  40-10-25 mg once daily Current hyperlipidemia medications include: atorvastatin  80 mg daily  Patient denies adherence with medications, reports missing Lantus  doses during her time of depression episode for the last two months. Reported still takes her other medications.   Patient reports hypoglycemic events. Reported it scared her why it went down so low to 49 and she had to eat a bag of chocolate.   Patient reports nocturia (nighttime urination). Reported she does not have very good sleep.  Patient reports neuropathy (nerve pain). Reported burning sensation on the feet that started earlier this year and Voltaren  does not work. Taking gabapentin  900 mg once in the morning. Patient reports visual changes. Patient reports self foot exams.   Patient reported dietary habits: Eats 1 meals/day. Reported has not been eating a lots, maybe one bowl of instant Chinese meal from the supermarket. May add a cup of fruit punch. Usually eat only once per day during nightime between midnight-3AM.   Patient-reported exercise  habits: No exercising activities. Reported her back does not let her and her children are nervous of her walking capacity.   O:   Review of Systems  All other systems reviewed and are negative.   Physical Exam Constitutional:      Appearance: Normal appearance.  Pulmonary:     Effort: Pulmonary effort is normal.  Psychiatric:        Mood and Affect: Mood normal.        Behavior: Behavior normal.        Thought Content: Thought content normal.    Libre3 CGM Download today 05/27/2024-06/09/2024 % Time CGM is active: 72.7% Average Glucose: 225 mg/dL Glucose Management Indicator: 8.7%  Glucose Variability: 30.6% (goal <36%) Time in Goal:  - Time in range 70-180: 33% - Time above range: 67% - Time below range: 0% Observed patterns:  Lab Results  Component Value Date   HGBA1C 9.6 (A) 06/03/2024   Vitals:   06/09/24 1326  BP: 96/64  Pulse: 92  SpO2: 99%    Lipid Panel     Component Value Date/Time   CHOL 122 02/26/2024 1621   TRIG 101 02/26/2024 1621   HDL 45 02/26/2024 1621   CHOLHDL 2.7 02/26/2024 1621   CHOLHDL 3.3 09/16/2017 0302   VLDL 16 09/16/2017 0302   LDLCALC 58 02/26/2024 1621   LDLDIRECT 51 03/27/2016 1614   A/P: Diabetes longstanding since 2010 currently uncontrolled. Patient is able to verbalize appropriate hypoglycemia management plan. Medication adherence appears poor. Control is suboptimal due to nonadherence. Reported she went through a depression state for the last 2 months and did not take Lantus  for a while and she believed that is what  messed up her A1C. Reported her BG ~140s when she took her all of her antidiabetic medications. She endorsed neuropathy in the feet with lots of burning sensation.  -Decreased dose of basal insulin  Lantus  (insulin  glargine) from 35 units to 30 units daily in the morning.  -Decreased dose of rapid insulin  Humalog  (insulin  lispro) from 25 to 20 units before the biggest meal.  -Continued GLP-1 Ozempic  (semaglutide )   2 mg once weekly -Continued SGLT2-I Jardiance  (empagliflozin ) 25 mg.  - Added 1 tablet of 300 mg gabapentin  at bedtime for neuropathy -Extensively discussed pathophysiology of diabetes, recommended lifestyle interventions, dietary effects on blood sugar control.  -Counseled on s/sx of and management of hypoglycemia.   ASCVD risk - secondary prevention in patient with diabetes. Last LDL is 58 at goal of <70  mg/dL. ASCVD risk factors include hypertension, hyperlipidemia, T2DM, AFib. High intensity statin indicated.  -Continued atorvastatin  80 mg.   Hypertension longstanding since 2008 currently controlled. Blood pressure goal of <130/80  mmHg. Hypertension medication adherence good. Office Blood Pressure is on the lower end today at 96/64, patient denied symptoms of dizziness. Patient reported frequent urination throughout the day.  -Decreased dose of olmesartan -amlodipine -hydrochlorothiazide  to 40-10-12.5 mg daily   Written patient instructions provided. Patient verbalized understanding of treatment plan.  Total time in face to face counseling 45 minutes.    Follow-up:  Pharmacist 07/14/2024 Patient seen with Lawson Mao, PharmD Candidate - PY3 student and [employer] PharmD - PY4 Candidate.

## 2024-06-09 NOTE — Assessment & Plan Note (Addendum)
 Diabetes longstanding since 2010 currently uncontrolled. Patient is able to verbalize appropriate hypoglycemia management plan. Medication adherence appears poor. Control is suboptimal due to nonadherence. Reported she went through a depression state for the last 2 months and did not take Lantus  for a while and she believed that is what messed up her A1C. Reported her BG ~140s when she took her all of her antidiabetic medications. She endorsed neuropathy in the feet with lots of burning sensation.  -Decreased dose of basal insulin  Lantus  (insulin  glargine) from 35 units to 30 units daily in the morning.  -Decreased dose of rapid insulin  Humalog  (insulin  lispro) from 25 to 20 units before the biggest meal.  -Continued GLP-1 Ozempic  (semaglutide )  2 mg once weekly -Continued SGLT2-I Jardiance  (empagliflozin ) 25 mg.  - Added 1 tablet of 300 mg gabapentin  at bedtime for neuropathy -Extensively discussed pathophysiology of diabetes, recommended lifestyle interventions, dietary effects on blood sugar control.  -Counseled on s/sx of and management of hypoglycemia.

## 2024-06-10 LAB — BASIC METABOLIC PANEL WITH GFR
BUN/Creatinine Ratio: 14 (ref 12–28)
BUN: 25 mg/dL (ref 8–27)
CO2: 25 mmol/L (ref 20–29)
Calcium: 9.7 mg/dL (ref 8.7–10.3)
Chloride: 100 mmol/L (ref 96–106)
Creatinine, Ser: 1.83 mg/dL — ABNORMAL HIGH (ref 0.57–1.00)
Glucose: 229 mg/dL — ABNORMAL HIGH (ref 70–99)
Potassium: 4.2 mmol/L (ref 3.5–5.2)
Sodium: 140 mmol/L (ref 134–144)
eGFR: 30 mL/min/1.73 — ABNORMAL LOW (ref >59)

## 2024-06-13 ENCOUNTER — Telehealth: Payer: Self-pay | Admitting: *Deleted

## 2024-06-13 ENCOUNTER — Ambulatory Visit: Payer: Self-pay | Admitting: Family Medicine

## 2024-06-13 DIAGNOSIS — N1832 Chronic kidney disease, stage 3b: Secondary | ICD-10-CM

## 2024-06-13 NOTE — Telephone Encounter (Signed)
 Patient was identified as falling into the [place] Measure - Diabetes.   Patient was: Appointment already scheduled for:  07/14/24. With pharmacy

## 2024-06-21 ENCOUNTER — Other Ambulatory Visit: Payer: Self-pay | Admitting: *Deleted

## 2024-06-21 DIAGNOSIS — E1169 Type 2 diabetes mellitus with other specified complication: Secondary | ICD-10-CM

## 2024-06-21 DIAGNOSIS — E1140 Type 2 diabetes mellitus with diabetic neuropathy, unspecified: Secondary | ICD-10-CM

## 2024-06-23 MED ORDER — EMPAGLIFLOZIN 25 MG PO TABS: 25.0000 mg | ORAL_TABLET | Freq: Every day | ORAL | 3 refills | Status: AC

## 2024-06-23 MED ORDER — ATORVASTATIN CALCIUM 80 MG PO TABS: 80.0000 mg | ORAL_TABLET | Freq: Every day | ORAL | 3 refills | Status: AC

## 2024-07-14 ENCOUNTER — Other Ambulatory Visit: Payer: Self-pay | Admitting: *Deleted

## 2024-07-14 ENCOUNTER — Encounter: Payer: Self-pay | Admitting: Pharmacist

## 2024-07-14 ENCOUNTER — Ambulatory Visit (INDEPENDENT_AMBULATORY_CARE_PROVIDER_SITE_OTHER): Admitting: Pharmacist

## 2024-07-14 ENCOUNTER — Other Ambulatory Visit (INDEPENDENT_AMBULATORY_CARE_PROVIDER_SITE_OTHER): Payer: Self-pay

## 2024-07-14 VITALS — BP 113/78 | HR 91 | Wt 224.6 lb

## 2024-07-14 DIAGNOSIS — I10 Essential (primary) hypertension: Secondary | ICD-10-CM

## 2024-07-14 DIAGNOSIS — B0229 Other postherpetic nervous system involvement: Secondary | ICD-10-CM

## 2024-07-14 DIAGNOSIS — N1832 Chronic kidney disease, stage 3b: Secondary | ICD-10-CM

## 2024-07-14 DIAGNOSIS — E119 Type 2 diabetes mellitus without complications: Secondary | ICD-10-CM

## 2024-07-14 DIAGNOSIS — E1169 Type 2 diabetes mellitus with other specified complication: Secondary | ICD-10-CM

## 2024-07-14 DIAGNOSIS — G8929 Other chronic pain: Secondary | ICD-10-CM

## 2024-07-14 LAB — POCT URINALYSIS DIP (MANUAL ENTRY)
Bilirubin, UA: NEGATIVE
Blood, UA: NEGATIVE
Glucose, UA: >=1000 mg/dL — AB
Ketones, POC UA: NEGATIVE mg/dL
Nitrite, UA: POSITIVE — AB
Protein Ur, POC: NEGATIVE mg/dL
Spec Grav, UA: 1.01 (ref 1.010–1.025)
Urobilinogen, UA: 0.2 U/dL
pH, UA: 5 (ref 5.0–8.0)

## 2024-07-14 LAB — POCT UA - MICROSCOPIC ONLY
Epithelial cells, urine per micros: >20
RBC, Urine, Miroscopic: NONE SEEN (ref 0–2)

## 2024-07-14 MED ORDER — LANTUS SOLOSTAR 100 UNIT/ML ~~LOC~~ SOPN: 50.0000 [IU] | PEN_INJECTOR | SUBCUTANEOUS | 3 refills | Status: AC

## 2024-07-14 MED ORDER — INSULIN LISPRO (1 UNIT DIAL) 100 UNIT/ML (KWIKPEN): 35.0000 [IU] | PEN_INJECTOR | Freq: Every day | SUBCUTANEOUS | 3 refills | Status: AC

## 2024-07-14 MED ORDER — LIDOCAINE 5 % EX PTCH: 1.0000 | MEDICATED_PATCH | Freq: Every day | CUTANEOUS | 3 refills | Status: AC | PRN

## 2024-07-14 NOTE — Assessment & Plan Note (Signed)
 Diabetes longstanding since 2010 currently greatly improved back to excellent level of control with increased adherence.  Patient is able to verbalize appropriate hypoglycemia management plan. Medication adherence appears GOOD. Reports feeling much better.   - Changed Lantus  (insulin  glargine) from 30 units twice daily (most days) to 50 units daily in the morning - Continued Humalog  (insulin  lispro) 35 units before the biggest meal.  - Continued GLP-1 Ozempic  (semaglutide )  2 mg once weekly - Continued SGLT2-I Jardiance  (empagliflozin ) 25 mg -Extensively discussed pathophysiology of diabetes, recommended lifestyle interventions, dietary effects on blood sugar control.  -Counseled on s/sx of and management of hypoglycemia.

## 2024-07-14 NOTE — Assessment & Plan Note (Signed)
 Hypertension longstanding since 2008 currently controlled. Blood pressure goal of <130/80  mmHg. Hypertension medication adherence good.  - Continued olmesartan -amlodipine -hydrochlorothiazide  to 40-10-12.5 mg daily

## 2024-07-14 NOTE — Patient Instructions (Signed)
 It was nice to see you today!  Your goal blood sugar is 80-130 before eating and less than 180 after eating.  Medication Changes: CHANGE Lantus  (insulin  glargine) to 50 units once daily in the AM  Continue Humalog  (insulin  lispro) at 35 units prior to evening meal.   Take Xarelto  (rivaroxaban ) with your evening meal  Continue all other medication the same.   Keep up the good work with diet and exercise. Aim for a diet full of vegetables, fruit and lean meats (chicken, turkey, fish). Try to limit salt intake by eating fresh or frozen vegetables (instead of canned), rinse canned vegetables prior to cooking and do not add any additional salt to meals.

## 2024-07-14 NOTE — Progress Notes (Signed)
 S:     Chief Complaint  Patient presents with   Medication Management    Diabetes - Hypertension   68 y.o. female who presents for diabetes evaluation, education, and management. Patient arrives in good spirits and presents with assistance of a cane.   Patient was referred and last seen by Primary Care Provider, Dr. Gaylon, on 06/03/2024.  At last visit, with me 11/6 patient shared non-adherence as need for worsened glucose control.   PMH is significant for T2DM, hyperlipidemia, depression, AFib, Chronic Kidney Disease stage II, HTN.    Current diabetes medications include: Lantus  (insulin  glargine) 30 units TWICE daily, Humalog  (insulin  lispro) 35 U before biggest meal  (supper) of the day, Jardiance  (empagliflozin ) 25 mg daily, Ozempic  (semaglutide ) 2 mg weekly Current hypertension medications include: olmesartan -amlodipine -hydrochlorothiazide  40-10-25 mg once daily Current hyperlipidemia medications include: atorvastatin  80 mg daily  Patient reports adherence and tolerance to all medications as prescribed.   Do you feel that your medications are working for you? yes Have you been experiencing any side effects to the medications prescribed? no  Patient reports hypoglycemic event with reading ~ 49 Reported handling of the low reading was reviewed and appropriate.   Patient denies excess nocturia (nighttime urination).   Patient reported dietary habits: Eats 2 meals/day  Patient-reported exercise habits: limited due to moderate to severe back pain  O:   Review of Systems  Musculoskeletal:  Positive for back pain.  Neurological:  Negative for dizziness and headaches.  All other systems reviewed and are negative.  Physical Exam Vitals reviewed.  Constitutional:      Appearance: Normal appearance.  Pulmonary:     Effort: Pulmonary effort is normal.  Neurological:     Mental Status: She is alert.  Psychiatric:        Mood and Affect: Mood normal.        Behavior:  Behavior normal.        Thought Content: Thought content normal.        Judgment: Judgment normal.   Libre3 CGM Download today  Average Glucose: 135 mg/dL Glucose Management Indicator: 6.5  Time in Goal:  - Time in range 70-180: 90%  Lab Results  Component Value Date   HGBA1C 9.6 (A) 06/03/2024   Vitals:   07/14/24 1409  BP: 113/78  Pulse: 91  SpO2: 100%    Lipid Panel     Component Value Date/Time   CHOL 122 02/26/2024 1621   TRIG 101 02/26/2024 1621   HDL 45 02/26/2024 1621   CHOLHDL 2.7 02/26/2024 1621   CHOLHDL 3.3 09/16/2017 0302   VLDL 16 09/16/2017 0302   LDLCALC 58 02/26/2024 1621   LDLDIRECT 51 03/27/2016 1614    Clinical Atherosclerotic Cardiovascular Disease (ASCVD):  The ASCVD Risk score (Arnett DK, et al., 2019) failed to calculate for the following reasons:   Risk score cannot be calculated because patient has a medical history suggesting prior/existing ASCVD   * - Cholesterol units were assumed   A/P: Diabetes longstanding since 2010 currently greatly improved back to excellent level of control with increased adherence.  Patient is able to verbalize appropriate hypoglycemia management plan. Medication adherence appears GOOD. Reports feeling much better.   - Changed Lantus  (insulin  glargine) from 30 units twice daily (most days) to 50 units daily in the morning - Continued Humalog  (insulin  lispro) 35 units before the biggest meal.  - Continued GLP-1 Ozempic  (semaglutide )  2 mg once weekly - Continued SGLT2-I Jardiance  (empagliflozin ) 25 mg -Extensively  discussed pathophysiology of diabetes, recommended lifestyle interventions, dietary effects on blood sugar control.  -Counseled on s/sx of and management of hypoglycemia.    ASCVD risk - secondary prevention in patient with diabetes. Last LDL is 58 at goal of <70  mg/dL. ASCVD risk factors include hypertension, hyperlipidemia, T2DM, AFib. High intensity statin indicated.  -Continued atorvastatin  80 mg.     Hypertension longstanding since 2008 currently controlled. Blood pressure goal of <130/80  mmHg. Hypertension medication adherence good.  - Continued olmesartan -amlodipine -hydrochlorothiazide  to 40-10-12.5 mg daily   Written patient instructions provided. Patient verbalized understanding of treatment plan.  Total time in face to face counseling 22 minutes.    Follow-up:  Pharmacist PRN PCP clinic visit 08/23/2024 - Anticipate A1C improvement

## 2024-07-15 ENCOUNTER — Ambulatory Visit: Payer: Self-pay | Admitting: Family Medicine

## 2024-07-15 LAB — BASIC METABOLIC PANEL WITH GFR
BUN/Creatinine Ratio: 18 (ref 12–28)
BUN: 24 mg/dL (ref 8–27)
CO2: 23 mmol/L (ref 20–29)
Calcium: 9.7 mg/dL (ref 8.7–10.3)
Chloride: 104 mmol/L (ref 96–106)
Creatinine, Ser: 1.33 mg/dL — ABNORMAL HIGH (ref 0.57–1.00)
Glucose: 130 mg/dL — ABNORMAL HIGH (ref 70–99)
Potassium: 3.6 mmol/L (ref 3.5–5.2)
Sodium: 143 mmol/L (ref 134–144)
eGFR: 43 mL/min/1.73 — ABNORMAL LOW (ref >59)

## 2024-07-15 LAB — MICROALBUMIN / CREATININE URINE RATIO
Creatinine, Urine: 91.9 mg/dL
Microalb/Creat Ratio: 12 mg/g{creat} (ref 0–29)
Microalbumin, Urine: 10.6 ug/mL

## 2024-07-15 NOTE — Progress Notes (Signed)
 Reviewed and agree with Dr Rennis plan.

## 2024-07-21 ENCOUNTER — Other Ambulatory Visit: Payer: Self-pay | Admitting: *Deleted

## 2024-07-22 ENCOUNTER — Encounter: Payer: Self-pay | Admitting: Family Medicine

## 2024-07-22 ENCOUNTER — Other Ambulatory Visit: Payer: Self-pay | Admitting: Family Medicine

## 2024-07-22 MED ORDER — RIVAROXABAN 15 MG PO TABS: 15.0000 mg | ORAL_TABLET | Freq: Every day | ORAL | 3 refills | Status: AC

## 2024-07-22 MED ORDER — RIVAROXABAN 20 MG PO TABS: 20.0000 mg | ORAL_TABLET | Freq: Every day | ORAL | 3 refills | Status: DC

## 2024-07-22 NOTE — Progress Notes (Signed)
 Patient with chronic afib on xarelto  for anticoagualtion. Adjusted long term xarelto  dosing to 15mg  daily given kidney function. Confirmed medication change with patient pharmacy. Attempted to call patient x2, no answer, mailbox not set up. Will send mychart message and attempt to re-call tomorrow.

## 2024-07-22 NOTE — Telephone Encounter (Signed)
 Chart reviewed. Rx refilled.

## 2024-07-24 ENCOUNTER — Telehealth: Payer: Self-pay | Admitting: Family Medicine

## 2024-07-24 NOTE — Telephone Encounter (Signed)
 Attempted to call patient x2, no answer, unable to leave voicemail. Previously sent mychart message regarding xarelto  change. Will plan to try and touch base with patient next week.

## 2024-07-25 ENCOUNTER — Telehealth: Payer: Self-pay | Admitting: Family Medicine

## 2024-07-25 NOTE — Telephone Encounter (Signed)
 Attempted to call patient x3, no answer, unable to leave voicemail. Mychart message previously sent. Will attempt to reach out next week if mychart message not read.

## 2024-08-01 ENCOUNTER — Telehealth: Payer: Self-pay | Admitting: Family Medicine

## 2024-08-01 NOTE — Telephone Encounter (Signed)
 Attempted to call patient x3, unable to reach, unable to leave voicemail due to mailbox not being set up. Attempted additional contacts available in chart, was able to leave message general message at daughter Daron Mixer number.   Will plan to reach out again next week. Prior calls attempted, prior Mychart message sent. Patient has been stable on her Xarelto  20 dosing for several months despite kidney changes.

## 2024-08-08 ENCOUNTER — Telehealth: Payer: Self-pay | Admitting: Family Medicine

## 2024-08-08 NOTE — Telephone Encounter (Signed)
 Called and spoke with patient regarding Xarelto  dosing change. Discussed change from Xarelto  20 to Xarelto  15 daily given new baseline kidney function and age. Patient expressed understanding. Patient has not yet received new dosage in the mail. Advised patient to call her pharmacy to ensure this new prescription is sent to her soon. Questions addressed. Consider discussing possible change to Eliquis at upcoming clinic visit.

## 2024-08-09 ENCOUNTER — Other Ambulatory Visit: Payer: Self-pay | Admitting: Family Medicine

## 2024-08-09 MED ORDER — ICOSAPENT ETHYL 0.5 G PO CAPS: 1.0000 | ORAL_CAPSULE | Freq: Two times a day (BID) | ORAL | 3 refills | Status: AC

## 2024-08-09 NOTE — Telephone Encounter (Signed)
 Chart reviewed. Rx refilled.

## 2024-08-23 ENCOUNTER — Ambulatory Visit: Payer: Self-pay | Admitting: Family Medicine

## 2024-08-23 ENCOUNTER — Encounter: Payer: Self-pay | Admitting: Family Medicine

## 2024-08-23 VITALS — BP 134/79 | HR 91 | Ht 62.0 in | Wt 221.8 lb

## 2024-08-23 DIAGNOSIS — Z794 Long term (current) use of insulin: Secondary | ICD-10-CM

## 2024-08-23 DIAGNOSIS — I4891 Unspecified atrial fibrillation: Secondary | ICD-10-CM | POA: Diagnosis not present

## 2024-08-23 DIAGNOSIS — E1169 Type 2 diabetes mellitus with other specified complication: Secondary | ICD-10-CM

## 2024-08-23 DIAGNOSIS — Z23 Encounter for immunization: Secondary | ICD-10-CM

## 2024-08-23 LAB — POCT GLYCOSYLATED HEMOGLOBIN (HGB A1C): HbA1c, POC (controlled diabetic range): 7.7 % — AB (ref 0.0–7.0)

## 2024-08-23 NOTE — Patient Instructions (Signed)
 Thank you for visiting clinic today and allowing us  to participate in your care!  Your A1c was in a good spot today! Please continue taking all your medications as prescribed.   Please schedule an appointment in 3 months for follow up.   Reach out any time with any questions or concerns you may have - we are here for you!  Damien Cassis, MD [facility] [phone]

## 2024-08-23 NOTE — Progress Notes (Unsigned)
" ° ° °  SUBJECTIVE:   CHIEF COMPLAINT / HPI:   T2DM 50 lantus  34 humalog  w biggest meal  Ozempic  weekly   Dexcom results: 14 days Avg 146 glucose 84% in range 15% high 1% very high    Xarelto  - discussed, prefer 1 per day   PERTINENT  PMH / PSH: ***  OBJECTIVE:   BP 134/79   Pulse 91   Ht 5' 2 (1.575 m)   Wt 221 lb 12.8 oz (100.6 kg)   SpO2 100%   BMI 40.57 kg/m   General: Well-appearing. Resting comfortably in room. CV: Normal S1/S2. No extra heart sounds. Warm and well-perfused. Pulm: Breathing comfortably on room air. CTAB. No increased WOB. Abd: Soft, non-tender, non-distended. Skin:  Warm, dry. Psych: Pleasant and appropriate.    ASSESSMENT/PLAN:   Assessment & Plan Type 2 diabetes mellitus with other specified complication, unspecified whether long term insulin  use (HCC)      Damien Cassis, MD [facility]  "

## 2024-08-24 ENCOUNTER — Encounter: Payer: Self-pay | Admitting: Family Medicine

## 2024-08-24 NOTE — Assessment & Plan Note (Signed)
 Patient with history of A-fib on Xarelto  for chronic anticoagulation.  Discussed the potential for switching to a more preferred agent like Eliquis, though patient prefers Xarelto  one-time a day.  Dosing.  Through shared decision making, we will continue with renally dosed Xarelto  15 mg per day.

## 2024-08-26 NOTE — Progress Notes (Signed)
 [name]                                          MRN: [id number]   08/26/2024   The [facility] Quality Team Specialist reviewed this patient medical record for the purposes of chart review for care gap closure. The following were reviewed: chart review for care gap closure-glycemic status assessment.    [facility] Quality Team

## 2024-10-24 ENCOUNTER — Encounter
